# Patient Record
Sex: Male | Born: 1935 | Race: White | Hispanic: No | Smoking: Former smoker
Health system: Southern US, Community
[De-identification: ages and names within clinical notes are randomized; demographics above are authoritative.]

## PROBLEM LIST (undated history)

## (undated) DIAGNOSIS — I509 Heart failure, unspecified: Secondary | ICD-10-CM

## (undated) DIAGNOSIS — R1310 Dysphagia, unspecified: Secondary | ICD-10-CM

## (undated) DIAGNOSIS — I4892 Unspecified atrial flutter: Secondary | ICD-10-CM

## (undated) DIAGNOSIS — I739 Peripheral vascular disease, unspecified: Secondary | ICD-10-CM

## (undated) DIAGNOSIS — K5520 Angiodysplasia of colon without hemorrhage: Secondary | ICD-10-CM

## (undated) DIAGNOSIS — C73 Malignant neoplasm of thyroid gland: Secondary | ICD-10-CM

## (undated) DIAGNOSIS — E039 Hypothyroidism, unspecified: Secondary | ICD-10-CM

## (undated) DIAGNOSIS — K259 Gastric ulcer, unspecified as acute or chronic, without hemorrhage or perforation: Secondary | ICD-10-CM

## (undated) DIAGNOSIS — J449 Chronic obstructive pulmonary disease, unspecified: Secondary | ICD-10-CM

## (undated) DIAGNOSIS — I2510 Atherosclerotic heart disease of native coronary artery without angina pectoris: Secondary | ICD-10-CM

## (undated) DIAGNOSIS — D352 Benign neoplasm of pituitary gland: Secondary | ICD-10-CM

## (undated) DIAGNOSIS — I219 Acute myocardial infarction, unspecified: Secondary | ICD-10-CM

## (undated) DIAGNOSIS — K219 Gastro-esophageal reflux disease without esophagitis: Secondary | ICD-10-CM

## (undated) DIAGNOSIS — J3800 Paralysis of vocal cords and larynx, unspecified: Secondary | ICD-10-CM

## (undated) DIAGNOSIS — R51 Headache: Secondary | ICD-10-CM

## (undated) DIAGNOSIS — D509 Iron deficiency anemia, unspecified: Secondary | ICD-10-CM

## (undated) HISTORY — DX: Peripheral vascular disease, unspecified: I73.9

## (undated) HISTORY — DX: Atherosclerotic heart disease of native coronary artery without angina pectoris: I25.10

## (undated) HISTORY — PX: CARDIAC CATHETERIZATION: SHX172

## (undated) HISTORY — DX: Gastric ulcer, unspecified as acute or chronic, without hemorrhage or perforation: K25.9

## (undated) HISTORY — DX: Heart failure, unspecified: I50.9

## (undated) HISTORY — PX: TONSILLECTOMY: SUR1361

## (undated) HISTORY — DX: Benign neoplasm of pituitary gland: D35.2

## (undated) HISTORY — DX: Unspecified atrial flutter: I48.92

## (undated) HISTORY — DX: Iron deficiency anemia, unspecified: D50.9

---

## 2001-11-29 HISTORY — PX: CORONARY ARTERY BYPASS GRAFT: SHX141

## 2002-01-03 ENCOUNTER — Inpatient Hospital Stay (HOSPITAL_COMMUNITY): Admission: EM | Admit: 2002-01-03 | Discharge: 2002-01-11 | Payer: Self-pay | Admitting: Emergency Medicine

## 2002-01-03 ENCOUNTER — Encounter: Payer: Self-pay | Admitting: Emergency Medicine

## 2002-01-04 ENCOUNTER — Encounter: Payer: Self-pay | Admitting: Cardiothoracic Surgery

## 2002-01-05 ENCOUNTER — Encounter: Payer: Self-pay | Admitting: Cardiothoracic Surgery

## 2002-01-06 ENCOUNTER — Encounter: Payer: Self-pay | Admitting: Thoracic Surgery (Cardiothoracic Vascular Surgery)

## 2002-01-06 ENCOUNTER — Encounter: Payer: Self-pay | Admitting: Cardiothoracic Surgery

## 2002-01-07 ENCOUNTER — Encounter: Payer: Self-pay | Admitting: Thoracic Surgery (Cardiothoracic Vascular Surgery)

## 2002-01-08 ENCOUNTER — Encounter: Payer: Self-pay | Admitting: Thoracic Surgery (Cardiothoracic Vascular Surgery)

## 2002-01-09 ENCOUNTER — Encounter: Payer: Self-pay | Admitting: Cardiothoracic Surgery

## 2002-01-26 ENCOUNTER — Encounter: Payer: Self-pay | Admitting: Cardiovascular Disease

## 2002-01-26 ENCOUNTER — Ambulatory Visit (HOSPITAL_COMMUNITY): Admission: RE | Admit: 2002-01-26 | Discharge: 2002-01-26 | Payer: Self-pay | Admitting: Cardiovascular Disease

## 2002-02-26 ENCOUNTER — Encounter: Payer: Self-pay | Admitting: Cardiothoracic Surgery

## 2002-02-26 ENCOUNTER — Ambulatory Visit (HOSPITAL_COMMUNITY): Admission: RE | Admit: 2002-02-26 | Discharge: 2002-02-26 | Payer: Self-pay | Admitting: Cardiothoracic Surgery

## 2003-11-15 ENCOUNTER — Observation Stay (HOSPITAL_COMMUNITY): Admission: EM | Admit: 2003-11-15 | Discharge: 2003-11-18 | Payer: Self-pay | Admitting: Emergency Medicine

## 2010-11-29 HISTORY — PX: EYE SURGERY: SHX253

## 2011-09-23 ENCOUNTER — Other Ambulatory Visit: Payer: Self-pay | Admitting: Neurology

## 2011-09-23 DIAGNOSIS — H5310 Unspecified subjective visual disturbances: Secondary | ICD-10-CM

## 2011-09-28 ENCOUNTER — Ambulatory Visit
Admission: RE | Admit: 2011-09-28 | Discharge: 2011-09-28 | Disposition: A | Discharge status: Home / Self Care | Payer: Medicare Other | Source: Ambulatory Visit | Attending: Neurology | Admitting: Neurology

## 2011-09-28 DIAGNOSIS — H5310 Unspecified subjective visual disturbances: Secondary | ICD-10-CM

## 2011-09-28 MED ORDER — GADOBENATE DIMEGLUMINE 529 MG/ML IV SOLN
20.0000 mL | Freq: Once | INTRAVENOUS | Status: AC | PRN
  Administered 2011-09-28: 20 mL via INTRAVENOUS

## 2011-12-07 DIAGNOSIS — C313 Malignant neoplasm of sphenoid sinus: Secondary | ICD-10-CM | POA: Diagnosis not present

## 2011-12-08 DIAGNOSIS — Z01810 Encounter for preprocedural cardiovascular examination: Secondary | ICD-10-CM | POA: Diagnosis not present

## 2011-12-08 DIAGNOSIS — I2510 Atherosclerotic heart disease of native coronary artery without angina pectoris: Secondary | ICD-10-CM | POA: Diagnosis not present

## 2011-12-08 DIAGNOSIS — R0609 Other forms of dyspnea: Secondary | ICD-10-CM | POA: Diagnosis not present

## 2011-12-08 DIAGNOSIS — R0602 Shortness of breath: Secondary | ICD-10-CM | POA: Diagnosis not present

## 2011-12-08 HISTORY — PX: OTHER SURGICAL HISTORY: SHX169

## 2011-12-10 DIAGNOSIS — E782 Mixed hyperlipidemia: Secondary | ICD-10-CM | POA: Diagnosis not present

## 2011-12-10 DIAGNOSIS — I2510 Atherosclerotic heart disease of native coronary artery without angina pectoris: Secondary | ICD-10-CM | POA: Diagnosis not present

## 2011-12-10 DIAGNOSIS — J449 Chronic obstructive pulmonary disease, unspecified: Secondary | ICD-10-CM | POA: Diagnosis not present

## 2011-12-16 ENCOUNTER — Encounter (HOSPITAL_COMMUNITY): Payer: Self-pay | Admitting: Pharmacy Technician

## 2011-12-24 ENCOUNTER — Encounter (HOSPITAL_COMMUNITY): Payer: Self-pay

## 2011-12-24 ENCOUNTER — Other Ambulatory Visit: Payer: Self-pay

## 2011-12-24 ENCOUNTER — Encounter (HOSPITAL_COMMUNITY)
Admission: RE | Admit: 2011-12-24 | Discharge: 2011-12-24 | Disposition: A | Discharge status: Home / Self Care | Payer: Medicare Other | Source: Ambulatory Visit | Attending: Neurosurgery | Admitting: Neurosurgery

## 2011-12-24 ENCOUNTER — Encounter (HOSPITAL_COMMUNITY)
Admission: RE | Admit: 2011-12-24 | Discharge: 2011-12-24 | Disposition: A | Discharge status: Home / Self Care | Payer: Medicare Other | Source: Ambulatory Visit | Attending: Anesthesiology | Admitting: Anesthesiology

## 2011-12-24 DIAGNOSIS — Z01811 Encounter for preprocedural respiratory examination: Secondary | ICD-10-CM | POA: Diagnosis not present

## 2011-12-24 DIAGNOSIS — D352 Benign neoplasm of pituitary gland: Secondary | ICD-10-CM | POA: Diagnosis not present

## 2011-12-24 DIAGNOSIS — J438 Other emphysema: Secondary | ICD-10-CM | POA: Diagnosis not present

## 2011-12-24 DIAGNOSIS — Z79899 Other long term (current) drug therapy: Secondary | ICD-10-CM | POA: Diagnosis not present

## 2011-12-24 DIAGNOSIS — R918 Other nonspecific abnormal finding of lung field: Secondary | ICD-10-CM | POA: Diagnosis not present

## 2011-12-24 DIAGNOSIS — J449 Chronic obstructive pulmonary disease, unspecified: Secondary | ICD-10-CM | POA: Diagnosis not present

## 2011-12-24 DIAGNOSIS — Z7982 Long term (current) use of aspirin: Secondary | ICD-10-CM | POA: Diagnosis not present

## 2011-12-24 HISTORY — DX: Headache: R51

## 2011-12-24 HISTORY — DX: Chronic obstructive pulmonary disease, unspecified: J44.9

## 2011-12-24 HISTORY — DX: Acute myocardial infarction, unspecified: I21.9

## 2011-12-24 HISTORY — DX: Gastro-esophageal reflux disease without esophagitis: K21.9

## 2011-12-24 LAB — DIFFERENTIAL
Basophils Absolute: 0.1 10*3/uL (ref 0.0–0.1)
Basophils Relative: 1 % (ref 0–1)
Eosinophils Absolute: 0.9 10*3/uL — ABNORMAL HIGH (ref 0.0–0.7)
Eosinophils Relative: 7 % — ABNORMAL HIGH (ref 0–5)
Lymphocytes Relative: 21 % (ref 12–46)
Lymphs Abs: 2.8 10*3/uL (ref 0.7–4.0)
Monocytes Absolute: 1 10*3/uL (ref 0.1–1.0)
Monocytes Relative: 8 % (ref 3–12)
Neutro Abs: 8.3 10*3/uL — ABNORMAL HIGH (ref 1.7–7.7)
Neutrophils Relative %: 63 % (ref 43–77)

## 2011-12-24 LAB — TYPE AND SCREEN
ABO/RH(D): O POS
Antibody Screen: NEGATIVE

## 2011-12-24 LAB — COMPREHENSIVE METABOLIC PANEL
ALT: 28 U/L (ref 0–53)
AST: 29 U/L (ref 0–37)
Albumin: 4 g/dL (ref 3.5–5.2)
Alkaline Phosphatase: 79 U/L (ref 39–117)
BUN: 13 mg/dL (ref 6–23)
CO2: 26 mEq/L (ref 19–32)
Calcium: 9.8 mg/dL (ref 8.4–10.5)
Chloride: 103 mEq/L (ref 96–112)
Creatinine, Ser: 0.84 mg/dL (ref 0.50–1.35)
GFR calc Af Amer: >90 mL/min (ref >90)
GFR calc non Af Amer: 84 mL/min — ABNORMAL LOW (ref >90)
Glucose, Bld: 90 mg/dL (ref 70–99)
Potassium: 4.1 mEq/L (ref 3.5–5.1)
Sodium: 140 mEq/L (ref 135–145)
Total Bilirubin: 0.3 mg/dL (ref 0.3–1.2)
Total Protein: 7.6 g/dL (ref 6.0–8.3)

## 2011-12-24 LAB — CBC
HCT: 41.9 % (ref 39.0–52.0)
Hemoglobin: 13.2 g/dL (ref 13.0–17.0)
MCH: 27.6 pg (ref 26.0–34.0)
MCHC: 31.5 g/dL (ref 30.0–36.0)
MCV: 87.7 fL (ref 78.0–100.0)
Platelets: 310 10*3/uL (ref 150–400)
RBC: 4.78 MIL/uL (ref 4.22–5.81)
RDW: 14.8 % (ref 11.5–15.5)
WBC: 13.1 10*3/uL — ABNORMAL HIGH (ref 4.0–10.5)

## 2011-12-24 LAB — SURGICAL PCR SCREEN
MRSA, PCR: NEGATIVE
Staphylococcus aureus: NEGATIVE

## 2011-12-24 LAB — ABO/RH: ABO/RH(D): O POS

## 2011-12-24 NOTE — Pre-Procedure Instructions (Signed)
20 John Sampson  12/24/2011   Your procedure is scheduled on:  February 2, FRIDAY  Report to [facility] at  5:30* AM.  Call this number if you have problems the morning of surgery: [phone]   Remember:   Do not eat food:After Midnight THURSDAY.  May have clear liquids: up to 4 Hours before arrival TIME--1:30 AM.  Clear liquids include soda, tea, black coffee, apple or grape juice, broth.   Take these medicines the morning of surgery with A SIP OF WATER: ALBUTEROL             DILACOR XR, TOPROL, OMEPRAZOLE   Do not wear jewelry, make-up or nail polish.  Do not wear lotions, powders, or perfumes. You may wear deodorant.  Do not shave 48 hours prior to surgery.   Do not bring valuables to the hospital.   Contacts, dentures or bridgework may not be worn into surgery.  Leave suitcase in the car. After surgery it may be brought to your room.  For patients admitted to the hospital, checkout time is 11:00 AM the day of discharge.   Patients discharged the day of surgery will not be allowed to drive home.  Name and phone number of your driver:  DENISE CHAMBERS -- DTR  Special Instructions: CHG Shower Use Special Wash: 1/2 bottle night before surgery and 1/2 bottle morning of surgery.   Please read over the following fact sheets that you were given: Pain Booklet, Blood Transfusion Information, MRSA Information and Surgical Site Infection Prevention

## 2011-12-27 NOTE — Consult Note (Signed)
Anesthesia:  Patient is a 76 year old male scheduled for a transphenoidal hypophysectomy for a pituitary macroadenoma on 12/31/11.  He has been cleared by Dr. Alanda Amass.  He is felt low CV risk, but moderate pulmonary risk according to his clearance note.    His history includes CAD/MI s/p CABG X 5 2003, COPD, HTN, GERD, headaches, former smoker.  EKG from 12/24/11 showed NSR with sinus arrythmia, LVH with repolarization abnormality (primarily V2, and lateral leads).  It is not significantly changed since at least October of 2011.  He had a stress test on 12/08/11 that showed a moderate perfusion defect due to infarct/scar with mild peri-infarct ischemia in the basal inferolateral and mid inferolateral regions.  Post-stress EF 43% with global LV systolic function mildly reduced.  Similar defects were seen on scans from 2007 and 2009, and ultimately the study was felt low risk.    [facility] sent an echo from 08/12/09, but notes reflect that he had a more recent echo in February of 2012 (will request), showing "normal left ventricular systolic function with an ejection fraction of 55% and no evidence of wall motion abnormalities."  This was different from "all his previous evaluations" that showed mildly depressed LV systolic function with an EF around 45-50%.    CXR on 12/24/11 showed emphysematous and minimal bronchitic changes. Enlargement of cardiac silhouette.    Labs noted.    Anticipate he can proceed if not acute cardiopulmonary changes.

## 2011-12-30 MED ORDER — CEFAZOLIN SODIUM 1-5 GM-% IV SOLN: 1.0000 g | INTRAVENOUS | Status: DC

## 2011-12-31 ENCOUNTER — Ambulatory Visit (HOSPITAL_COMMUNITY): Payer: Medicare Other

## 2011-12-31 ENCOUNTER — Encounter (HOSPITAL_COMMUNITY): Payer: Self-pay | Admitting: *Deleted

## 2011-12-31 ENCOUNTER — Ambulatory Visit (HOSPITAL_COMMUNITY): Payer: Medicare Other | Admitting: Vascular Surgery

## 2011-12-31 ENCOUNTER — Encounter (HOSPITAL_COMMUNITY): Payer: Self-pay | Admitting: Vascular Surgery

## 2011-12-31 ENCOUNTER — Encounter (HOSPITAL_COMMUNITY)
Admission: RE | Disposition: A | Discharge status: Home / Self Care | Payer: Self-pay | Source: Ambulatory Visit | Attending: Neurosurgery

## 2011-12-31 ENCOUNTER — Inpatient Hospital Stay (HOSPITAL_COMMUNITY)
Admission: RE | Admit: 2011-12-31 | Discharge: 2012-01-04 | DRG: 615 | Disposition: A | Discharge status: Home / Self Care | Payer: Medicare Other | Source: Ambulatory Visit | Attending: Neurosurgery | Admitting: Neurosurgery

## 2011-12-31 ENCOUNTER — Other Ambulatory Visit: Payer: Self-pay | Admitting: Neurosurgery

## 2011-12-31 DIAGNOSIS — I2510 Atherosclerotic heart disease of native coronary artery without angina pectoris: Secondary | ICD-10-CM | POA: Diagnosis present

## 2011-12-31 DIAGNOSIS — Z09 Encounter for follow-up examination after completed treatment for conditions other than malignant neoplasm: Secondary | ICD-10-CM | POA: Diagnosis not present

## 2011-12-31 DIAGNOSIS — Z951 Presence of aortocoronary bypass graft: Secondary | ICD-10-CM | POA: Diagnosis not present

## 2011-12-31 DIAGNOSIS — Z79899 Other long term (current) drug therapy: Secondary | ICD-10-CM

## 2011-12-31 DIAGNOSIS — D352 Benign neoplasm of pituitary gland: Principal | ICD-10-CM | POA: Diagnosis present

## 2011-12-31 DIAGNOSIS — I10 Essential (primary) hypertension: Secondary | ICD-10-CM | POA: Diagnosis present

## 2011-12-31 DIAGNOSIS — K219 Gastro-esophageal reflux disease without esophagitis: Secondary | ICD-10-CM | POA: Diagnosis present

## 2011-12-31 DIAGNOSIS — I252 Old myocardial infarction: Secondary | ICD-10-CM

## 2011-12-31 DIAGNOSIS — Z01810 Encounter for preprocedural cardiovascular examination: Secondary | ICD-10-CM | POA: Diagnosis not present

## 2011-12-31 DIAGNOSIS — J4489 Other specified chronic obstructive pulmonary disease: Secondary | ICD-10-CM | POA: Diagnosis not present

## 2011-12-31 DIAGNOSIS — J449 Chronic obstructive pulmonary disease, unspecified: Secondary | ICD-10-CM | POA: Diagnosis present

## 2011-12-31 DIAGNOSIS — R51 Headache: Secondary | ICD-10-CM | POA: Diagnosis not present

## 2011-12-31 DIAGNOSIS — Z87891 Personal history of nicotine dependence: Secondary | ICD-10-CM | POA: Diagnosis not present

## 2011-12-31 DIAGNOSIS — D353 Benign neoplasm of craniopharyngeal duct: Principal | ICD-10-CM | POA: Diagnosis present

## 2011-12-31 DIAGNOSIS — Z01818 Encounter for other preprocedural examination: Secondary | ICD-10-CM | POA: Diagnosis not present

## 2011-12-31 DIAGNOSIS — D443 Neoplasm of uncertain behavior of pituitary gland: Secondary | ICD-10-CM | POA: Diagnosis not present

## 2011-12-31 DIAGNOSIS — Z7982 Long term (current) use of aspirin: Secondary | ICD-10-CM

## 2011-12-31 DIAGNOSIS — Z01812 Encounter for preprocedural laboratory examination: Secondary | ICD-10-CM

## 2011-12-31 DIAGNOSIS — R930 Abnormal findings on diagnostic imaging of skull and head, not elsewhere classified: Secondary | ICD-10-CM | POA: Diagnosis not present

## 2011-12-31 DIAGNOSIS — D444 Neoplasm of uncertain behavior of craniopharyngeal duct: Secondary | ICD-10-CM | POA: Diagnosis not present

## 2011-12-31 DIAGNOSIS — I517 Cardiomegaly: Secondary | ICD-10-CM | POA: Diagnosis not present

## 2011-12-31 DIAGNOSIS — J811 Chronic pulmonary edema: Secondary | ICD-10-CM | POA: Diagnosis not present

## 2011-12-31 HISTORY — PX: CRANIOTOMY: SHX93

## 2011-12-31 LAB — BASIC METABOLIC PANEL WITH GFR
BUN: 12 mg/dL (ref 6–23)
CO2: 22 meq/L (ref 19–32)
Calcium: 8.5 mg/dL (ref 8.4–10.5)
Chloride: 107 meq/L (ref 96–112)
Creatinine, Ser: 0.71 mg/dL (ref 0.50–1.35)
GFR calc Af Amer: >90 mL/min
GFR calc non Af Amer: >90 mL/min
Glucose, Bld: 162 mg/dL — ABNORMAL HIGH (ref 70–99)
Potassium: 4.2 meq/L (ref 3.5–5.1)
Sodium: 137 meq/L (ref 135–145)

## 2011-12-31 LAB — CBC
HCT: 37.3 % — ABNORMAL LOW (ref 39.0–52.0)
MCV: 86.9 fL (ref 78.0–100.0)
RDW: 15.1 % (ref 11.5–15.5)
WBC: 12.3 10*3/uL — ABNORMAL HIGH (ref 4.0–10.5)

## 2011-12-31 SURGERY — CRANIOTOMY HYPOPHYSECTOMY TRANSNASAL APPROACH
Anesthesia: General | Site: Nose | Wound class: Clean Contaminated

## 2011-12-31 MED ORDER — IPRATROPIUM-ALBUTEROL 18-103 MCG/ACT IN AERO
2.0000 | INHALATION_SPRAY | Freq: Four times a day (QID) | RESPIRATORY_TRACT | Status: DC | PRN
  Filled 2011-12-31 (×2): qty 14.7

## 2011-12-31 MED ORDER — METOPROLOL TARTRATE 1 MG/ML IV SOLN
INTRAVENOUS | Status: DC | PRN
  Administered 2011-12-31: 2 mg via INTRAVENOUS

## 2011-12-31 MED ORDER — DILTIAZEM HCL ER 180 MG PO CP24
180.0000 mg | ORAL_CAPSULE | Freq: Every day | ORAL | Status: DC
  Administered 2011-12-31 – 2012-01-04 (×5): 180 mg via ORAL
  Filled 2011-12-31 (×5): qty 1

## 2011-12-31 MED ORDER — EPHEDRINE SULFATE 50 MG/ML IJ SOLN
INTRAMUSCULAR | Status: DC | PRN
  Administered 2011-12-31: 5 mg via INTRAVENOUS

## 2011-12-31 MED ORDER — HYDROCODONE-ACETAMINOPHEN 5-325 MG PO TABS
1.0000 | ORAL_TABLET | ORAL | Status: DC | PRN
  Administered 2012-01-01 – 2012-01-03 (×3): 1 via ORAL
  Filled 2011-12-31 (×3): qty 1

## 2011-12-31 MED ORDER — CEFAZOLIN SODIUM 1-5 GM-% IV SOLN
1.0000 g | Freq: Three times a day (TID) | INTRAVENOUS | Status: AC
  Administered 2011-12-31 (×2): 1 g via INTRAVENOUS
  Filled 2011-12-31 (×2): qty 50

## 2011-12-31 MED ORDER — MIDAZOLAM HCL 5 MG/5ML IJ SOLN
INTRAMUSCULAR | Status: DC | PRN
  Administered 2011-12-31: 1 mg via INTRAVENOUS

## 2011-12-31 MED ORDER — HYDRALAZINE HCL 20 MG/ML IJ SOLN
5.0000 mg | INTRAMUSCULAR | Status: DC | PRN
  Administered 2011-12-31: 5 mg via INTRAVENOUS
  Filled 2011-12-31: qty 0.25

## 2011-12-31 MED ORDER — POTASSIUM CHLORIDE IN NACL 20-0.45 MEQ/L-% IV SOLN
INTRAVENOUS | Status: DC
  Administered 2011-12-31 – 2012-01-01 (×2): via INTRAVENOUS
  Filled 2011-12-31 (×3): qty 1000

## 2011-12-31 MED ORDER — ALBUTEROL SULFATE (2.5 MG/3ML) 0.083% IN NEBU
INHALATION_SOLUTION | RESPIRATORY_TRACT | Status: DC | PRN
  Administered 2011-12-31: 2.5 mg via RESPIRATORY_TRACT

## 2011-12-31 MED ORDER — DOCUSATE SODIUM 100 MG PO CAPS
100.0000 mg | ORAL_CAPSULE | Freq: Two times a day (BID) | ORAL | Status: DC
  Administered 2011-12-31 – 2012-01-04 (×9): 100 mg via ORAL
  Filled 2011-12-31 (×10): qty 1

## 2011-12-31 MED ORDER — PANTOPRAZOLE SODIUM 40 MG IV SOLR
40.0000 mg | INTRAVENOUS | Status: DC
  Administered 2011-12-31: 40 mg via INTRAVENOUS
  Filled 2011-12-31 (×2): qty 40

## 2011-12-31 MED ORDER — LIDOCAINE HCL (CARDIAC) 20 MG/ML IV SOLN
INTRAVENOUS | Status: DC | PRN
  Administered 2011-12-31: 40 mg via INTRAVENOUS

## 2011-12-31 MED ORDER — HYDROCHLOROTHIAZIDE 25 MG PO TABS
25.0000 mg | ORAL_TABLET | Freq: Every day | ORAL | Status: DC
  Administered 2011-12-31 – 2012-01-04 (×5): 25 mg via ORAL
  Filled 2011-12-31 (×5): qty 1

## 2011-12-31 MED ORDER — THROMBIN 5000 UNITS EX SOLR
OROMUCOSAL | Status: DC | PRN
  Administered 2011-12-31: 10:00:00 via TOPICAL

## 2011-12-31 MED ORDER — HYDROCORTISONE SOD SUCCINATE 100 MG PF FOR IT USE
INTRAMUSCULAR | Status: DC | PRN
  Administered 2011-12-31: 100 mg via INTRATHECAL

## 2011-12-31 MED ORDER — SODIUM CHLORIDE 0.9 % IR SOLN
Status: DC | PRN
  Administered 2011-12-31: 09:00:00

## 2011-12-31 MED ORDER — OXYMETAZOLINE HCL 0.05 % NA SOLN
NASAL | Status: DC | PRN
  Administered 2011-12-31: 1 via NASAL

## 2011-12-31 MED ORDER — LIDOCAINE-EPINEPHRINE 1 %-1:100000 IJ SOLN
INTRAMUSCULAR | Status: DC | PRN
  Administered 2011-12-31 (×2): 20 mL

## 2011-12-31 MED ORDER — ASPIRIN EC 81 MG PO TBEC
162.0000 mg | DELAYED_RELEASE_TABLET | Freq: Every day | ORAL | Status: DC
  Administered 2011-12-31 – 2012-01-04 (×5): 162 mg via ORAL
  Filled 2011-12-31 (×5): qty 2

## 2011-12-31 MED ORDER — HYDROCORTISONE SOD SUCCINATE 100 MG IJ SOLR
50.0000 mg | Freq: Three times a day (TID) | INTRAMUSCULAR | Status: AC
  Administered 2011-12-31 – 2012-01-01 (×4): 50 mg via INTRAVENOUS
  Filled 2011-12-31 (×4): qty 1

## 2011-12-31 MED ORDER — BACITRACIN ZINC 500 UNIT/GM EX OINT
TOPICAL_OINTMENT | CUTANEOUS | Status: DC | PRN
  Administered 2011-12-31: 1 via TOPICAL

## 2011-12-31 MED ORDER — CEFAZOLIN SODIUM-DEXTROSE 2-3 GM-% IV SOLR
2.0000 g | INTRAVENOUS | Status: AC
  Administered 2011-12-31: 2 g via INTRAVENOUS
  Filled 2011-12-31: qty 50

## 2011-12-31 MED ORDER — HYDRALAZINE HCL 20 MG/ML IJ SOLN
5.0000 mg | INTRAMUSCULAR | Status: DC | PRN
  Administered 2011-12-31: 5 mg via INTRAVENOUS
  Filled 2011-12-31: qty 1

## 2011-12-31 MED ORDER — METOPROLOL SUCCINATE ER 25 MG PO TB24
25.0000 mg | ORAL_TABLET | Freq: Every day | ORAL | Status: DC
  Administered 2011-12-31 – 2012-01-04 (×3): 25 mg via ORAL
  Filled 2011-12-31 (×5): qty 1

## 2011-12-31 MED ORDER — PANTOPRAZOLE SODIUM 40 MG PO TBEC: 40.0000 mg | DELAYED_RELEASE_TABLET | Freq: Every day | ORAL | Status: DC

## 2011-12-31 MED ORDER — PROPOFOL 10 MG/ML IV EMUL
INTRAVENOUS | Status: DC | PRN
  Administered 2011-12-31: 50 mg via INTRAVENOUS
  Administered 2011-12-31: 20 mg via INTRAVENOUS
  Administered 2011-12-31: 100 mg via INTRAVENOUS
  Administered 2011-12-31: 50 mg via INTRAVENOUS

## 2011-12-31 MED ORDER — BACITRACIN 50000 UNITS IM SOLR
INTRAMUSCULAR | Status: AC
  Filled 2011-12-31: qty 1

## 2011-12-31 MED ORDER — NEOSTIGMINE METHYLSULFATE 1 MG/ML IJ SOLN
INTRAMUSCULAR | Status: DC | PRN
  Administered 2011-12-31: 4 mg via INTRAVENOUS

## 2011-12-31 MED ORDER — DROPERIDOL 2.5 MG/ML IJ SOLN: 0.6250 mg | INTRAMUSCULAR | Status: DC | PRN

## 2011-12-31 MED ORDER — ACETAMINOPHEN 325 MG PO TABS
650.0000 mg | ORAL_TABLET | ORAL | Status: DC | PRN
  Administered 2012-01-01 – 2012-01-04 (×5): 650 mg via ORAL
  Filled 2011-12-31 (×5): qty 2

## 2011-12-31 MED ORDER — SODIUM CHLORIDE 0.9 % IV SOLN
10000.0000 ug | INTRAVENOUS | Status: DC | PRN
  Administered 2011-12-31: 25 ug/min via INTRAVENOUS

## 2011-12-31 MED ORDER — LABETALOL HCL 5 MG/ML IV SOLN
10.0000 mg | INTRAVENOUS | Status: DC | PRN
  Administered 2011-12-31: 10 mg via INTRAVENOUS
  Filled 2011-12-31: qty 4

## 2011-12-31 MED ORDER — LISINOPRIL 20 MG PO TABS
20.0000 mg | ORAL_TABLET | Freq: Every day | ORAL | Status: DC
  Administered 2011-12-31 – 2012-01-04 (×4): 20 mg via ORAL
  Filled 2011-12-31 (×5): qty 1

## 2011-12-31 MED ORDER — SODIUM CHLORIDE 0.9 % IV SOLN
INTRAVENOUS | Status: AC
  Administered 2011-12-31: 07:00:00 via INTRAVENOUS
  Filled 2011-12-31: qty 500

## 2011-12-31 MED ORDER — LEVETIRACETAM 500 MG/5ML IV SOLN
500.0000 mg | Freq: Two times a day (BID) | INTRAVENOUS | Status: DC
  Administered 2011-12-31 – 2012-01-02 (×5): 500 mg via INTRAVENOUS
  Filled 2011-12-31 (×6): qty 5

## 2011-12-31 MED ORDER — ONDANSETRON HCL 4 MG PO TABS: 4.0000 mg | ORAL_TABLET | ORAL | Status: DC | PRN

## 2011-12-31 MED ORDER — ONDANSETRON HCL 4 MG/2ML IJ SOLN
4.0000 mg | INTRAMUSCULAR | Status: DC | PRN
  Administered 2011-12-31: 4 mg via INTRAVENOUS
  Filled 2011-12-31: qty 2

## 2011-12-31 MED ORDER — SODIUM CHLORIDE 0.9 % IV SOLN
INTRAVENOUS | Status: DC | PRN
  Administered 2011-12-31 (×2): via INTRAVENOUS

## 2011-12-31 MED ORDER — ACETAMINOPHEN 650 MG RE SUPP: 650.0000 mg | RECTAL | Status: DC | PRN

## 2011-12-31 MED ORDER — PANTOPRAZOLE SODIUM 40 MG IV SOLR: 40.0000 mg | Freq: Every day | INTRAVENOUS | Status: DC

## 2011-12-31 MED ORDER — GLYCOPYRROLATE 0.2 MG/ML IJ SOLN
INTRAMUSCULAR | Status: DC | PRN
  Administered 2011-12-31: .7 mg via INTRAVENOUS

## 2011-12-31 MED ORDER — CEFAZOLIN SODIUM-DEXTROSE 2-3 GM-% IV SOLR
INTRAVENOUS | Status: AC
  Filled 2011-12-31: qty 50

## 2011-12-31 MED ORDER — ONDANSETRON HCL 4 MG/2ML IJ SOLN
INTRAMUSCULAR | Status: DC | PRN
  Administered 2011-12-31: 4 mg via INTRAVENOUS

## 2011-12-31 MED ORDER — CEFAZOLIN SODIUM 1-5 GM-% IV SOLN: 1.0000 g | INTRAVENOUS | Status: DC

## 2011-12-31 MED ORDER — HYDROMORPHONE HCL PF 1 MG/ML IJ SOLN: 0.2500 mg | INTRAMUSCULAR | Status: DC | PRN

## 2011-12-31 MED ORDER — VECURONIUM BROMIDE 10 MG IV SOLR
INTRAVENOUS | Status: DC | PRN
  Administered 2011-12-31: 4 mg via INTRAVENOUS
  Administered 2011-12-31: 10 mg via INTRAVENOUS

## 2011-12-31 MED ORDER — MORPHINE SULFATE 2 MG/ML IJ SOLN
1.0000 mg | INTRAMUSCULAR | Status: DC | PRN
  Administered 2011-12-31: 2 mg via INTRAVENOUS
  Filled 2011-12-31: qty 1

## 2011-12-31 MED ORDER — 0.9 % SODIUM CHLORIDE (POUR BTL) OPTIME
TOPICAL | Status: DC | PRN
  Administered 2011-12-31: 1000 mL

## 2011-12-31 MED ORDER — FENTANYL CITRATE 0.05 MG/ML IJ SOLN
INTRAMUSCULAR | Status: DC | PRN
  Administered 2011-12-31: 50 ug via INTRAVENOUS
  Administered 2011-12-31 (×2): 25 ug via INTRAVENOUS
  Administered 2011-12-31: 50 ug via INTRAVENOUS
  Administered 2011-12-31: 25 ug via INTRAVENOUS
  Administered 2011-12-31: 50 ug via INTRAVENOUS
  Administered 2011-12-31: 125 ug via INTRAVENOUS

## 2011-12-31 MED ORDER — THROMBIN 5000 UNITS EX SOLR
CUTANEOUS | Status: DC | PRN
  Administered 2011-12-31 (×2): 5000 [IU] via TOPICAL

## 2011-12-31 MED ORDER — SIMVASTATIN 10 MG PO TABS
10.0000 mg | ORAL_TABLET | Freq: Every day | ORAL | Status: DC
  Administered 2011-12-31 – 2012-01-03 (×4): 10 mg via ORAL
  Filled 2011-12-31 (×5): qty 1

## 2011-12-31 SURGICAL SUPPLY — 115 items
ATTRACTOMAT 16X20 MAGNETIC DRP (DRAPES) IMPLANT
BAG DECANTER FOR FLEXI CONT (MISCELLANEOUS) ×2 IMPLANT
BENZOIN TINCTURE PRP APPL 2/3 (GAUZE/BANDAGES/DRESSINGS) IMPLANT
BLADE SURG 10 STRL SS (BLADE) ×2 IMPLANT
BLADE SURG 11 STRL SS (BLADE) ×4 IMPLANT
BLADE SURG 15 STRL LF DISP TIS (BLADE) ×2 IMPLANT
BLADE SURG 15 STRL SS (BLADE) ×2
BRUSH SCRUB EZ PLAIN DRY (MISCELLANEOUS) ×2 IMPLANT
CANISTER SUCTION 2500CC (MISCELLANEOUS) ×4 IMPLANT
CLEANER TIP ELECTROSURG 2X2 (MISCELLANEOUS) IMPLANT
CLOTH BEACON ORANGE TIMEOUT ST (SAFETY) ×4 IMPLANT
COAGULATOR SUCT 8FR VV (MISCELLANEOUS) IMPLANT
CONT SPEC 4OZ CLIKSEAL STRL BL (MISCELLANEOUS) ×12 IMPLANT
CORDS BIPOLAR (ELECTRODE) ×2 IMPLANT
COVER MAYO STAND STRL (DRAPES) ×2 IMPLANT
COVER TABLE BACK 60X90 (DRAPES) IMPLANT
DECANTER SPIKE VIAL GLASS SM (MISCELLANEOUS) ×4 IMPLANT
DERMABOND ADVANCED (GAUZE/BANDAGES/DRESSINGS) ×1
DERMABOND ADVANCED .7 DNX12 (GAUZE/BANDAGES/DRESSINGS) ×1 IMPLANT
DRAPE C-ARM 42X72 X-RAY (DRAPES) ×2 IMPLANT
DRAPE EENT ADH APERT 15X15 STR (DRAPES) IMPLANT
DRAPE INCISE IOBAN 66X45 STRL (DRAPES) ×2 IMPLANT
DRAPE MICROSCOPE ZEISS OPMI (DRAPES) ×2 IMPLANT
DRAPE POUCH INSTRU U-SHP 10X18 (DRAPES) ×2 IMPLANT
DRAPE PROXIMA HALF (DRAPES) ×4 IMPLANT
DRAPE SURG 17X23 STRL (DRAPES) ×6 IMPLANT
DRESSING NASAL KENNEDY 3.5X.9 (MISCELLANEOUS) ×1 IMPLANT
DRESSING TELFA 8X3 (GAUZE/BANDAGES/DRESSINGS) ×2 IMPLANT
DRSG NASAL KENNEDY 3.5X.9 (MISCELLANEOUS) ×2
DRSG OPSITE 4X5.5 SM (GAUZE/BANDAGES/DRESSINGS) ×2 IMPLANT
ELECT COATED BLADE 2.86 ST (ELECTRODE) IMPLANT
ELECT NEEDLE TIP 2.8 STRL (NEEDLE) ×2 IMPLANT
ELECT REM PT RETURN 9FT ADLT (ELECTROSURGICAL) ×2
ELECTRODE REM PT RTRN 9FT ADLT (ELECTROSURGICAL) ×1 IMPLANT
GAUZE PACKING FOLDED 2  STR (GAUZE/BANDAGES/DRESSINGS) ×1
GAUZE PACKING FOLDED 2 STR (GAUZE/BANDAGES/DRESSINGS) ×1 IMPLANT
GAUZE SPONGE 2X2 8PLY STRL LF (GAUZE/BANDAGES/DRESSINGS) ×1 IMPLANT
GLOVE BIO SURGEON STRL SZ 6.5 (GLOVE) IMPLANT
GLOVE BIO SURGEON STRL SZ7 (GLOVE) IMPLANT
GLOVE BIO SURGEON STRL SZ7.5 (GLOVE) ×4 IMPLANT
GLOVE BIO SURGEON STRL SZ8 (GLOVE) ×4 IMPLANT
GLOVE BIO SURGEON STRL SZ8.5 (GLOVE) IMPLANT
GLOVE BIOGEL M 8.0 STRL (GLOVE) IMPLANT
GLOVE BIOGEL PI IND STRL 7.5 (GLOVE) ×2 IMPLANT
GLOVE BIOGEL PI IND STRL 8 (GLOVE) ×1 IMPLANT
GLOVE BIOGEL PI INDICATOR 7.5 (GLOVE) ×2
GLOVE BIOGEL PI INDICATOR 8 (GLOVE) ×1
GLOVE EXAM NITRILE LRG STRL (GLOVE) IMPLANT
GLOVE EXAM NITRILE MD LF STRL (GLOVE) ×6 IMPLANT
GLOVE EXAM NITRILE XL STR (GLOVE) IMPLANT
GLOVE EXAM NITRILE XS STR PU (GLOVE) IMPLANT
GLOVE INDICATOR 6.5 STRL GRN (GLOVE) IMPLANT
GLOVE INDICATOR 7.0 STRL GRN (GLOVE) IMPLANT
GLOVE INDICATOR 7.5 STRL GRN (GLOVE) IMPLANT
GLOVE INDICATOR 8.0 STRL GRN (GLOVE) IMPLANT
GLOVE SS BIOGEL STRL SZ 7.5 (GLOVE) ×1 IMPLANT
GLOVE SUPERSENSE BIOGEL SZ 7.5 (GLOVE) ×1
GLOVE SURG SS PI 6.5 STRL IVOR (GLOVE) IMPLANT
GLOVE SURG SS PI 7.5 STRL IVOR (GLOVE) ×4 IMPLANT
GLOVE SURG SS PI 8.0 STRL IVOR (GLOVE) ×6 IMPLANT
GOWN BRE IMP SLV AUR LG STRL (GOWN DISPOSABLE) ×2 IMPLANT
GOWN BRE IMP SLV AUR XL STRL (GOWN DISPOSABLE) ×8 IMPLANT
GOWN PREVENTION PLUS XLARGE (GOWN DISPOSABLE) ×4 IMPLANT
GOWN STRL NON-REIN LRG LVL3 (GOWN DISPOSABLE) ×2 IMPLANT
GOWN STRL REIN 2XL LVL4 (GOWN DISPOSABLE) ×2 IMPLANT
HEMOSTAT POWDER KIT SURGIFOAM (HEMOSTASIS) ×2 IMPLANT
HEMOSTAT SURGICEL 2X14 (HEMOSTASIS) IMPLANT
KIT BASIN OR (CUSTOM PROCEDURE TRAY) ×4 IMPLANT
KIT DRAIN CSF ACCUDRAIN (MISCELLANEOUS) IMPLANT
KIT ROOM TURNOVER OR (KITS) ×4 IMPLANT
MEROCEL POPE EPISTAXIS PACKING, DRAWSTRING, 10CM ×4 IMPLANT
NEEDLE 27GAX1X1/2 (NEEDLE) ×2 IMPLANT
NEEDLE HYPO 25X1 1.5 SAFETY (NEEDLE) ×2 IMPLANT
NEEDLE SPNL 22GX3.5 QUINCKE BK (NEEDLE) IMPLANT
NS IRRIG 1000ML POUR BTL (IV SOLUTION) ×4 IMPLANT
PAD ARMBOARD 7.5X6 YLW CONV (MISCELLANEOUS) ×6 IMPLANT
PATTIES SURGICAL .25X.25 (GAUZE/BANDAGES/DRESSINGS) IMPLANT
PATTIES SURGICAL .5 X.5 (GAUZE/BANDAGES/DRESSINGS) IMPLANT
PATTIES SURGICAL .5 X3 (DISPOSABLE) ×2 IMPLANT
PENCIL BUTTON HOLSTER BLD 10FT (ELECTRODE) ×2 IMPLANT
PENCIL FOOT CONTROL (ELECTRODE) IMPLANT
PIN MAYFIELD SKULL DISP (PIN) ×2 IMPLANT
RUBBERBAND STERILE (MISCELLANEOUS) ×4 IMPLANT
SHEET SIL 040 (INSTRUMENTS) IMPLANT
SPLINT NASAL DOYLE BI-VL (GAUZE/BANDAGES/DRESSINGS) ×4 IMPLANT
SPONGE GAUZE 2X2 STER 10/PKG (GAUZE/BANDAGES/DRESSINGS) ×1
SPONGE GAUZE 4X4 12PLY (GAUZE/BANDAGES/DRESSINGS) ×2 IMPLANT
SPONGE LAP 4X18 X RAY DECT (DISPOSABLE) ×2 IMPLANT
SPONGE SURGIFOAM ABS GEL 12-7 (HEMOSTASIS) IMPLANT
STAPLER SKIN PROX WIDE 3.9 (STAPLE) ×2 IMPLANT
STRIP CLOSURE SKIN 1/2X4 (GAUZE/BANDAGES/DRESSINGS) ×2 IMPLANT
SUT BONE WAX W31G (SUTURE) ×2 IMPLANT
SUT CHROMIC 3 0 PS 2 (SUTURE) IMPLANT
SUT CHROMIC 4 0 P 3 18 (SUTURE) ×2 IMPLANT
SUT CHROMIC 4 0 PS 2 18 (SUTURE) ×4 IMPLANT
SUT CHROMIC 5 0 P 3 (SUTURE) IMPLANT
SUT ETHILON 3 0 FSL (SUTURE) ×2 IMPLANT
SUT ETHILON 3 0 PS 1 (SUTURE) ×2 IMPLANT
SUT ETHILON 4 0 PS 2 18 (SUTURE) ×2 IMPLANT
SUT ETHILON 5 0 P 3 18 (SUTURE)
SUT ETHILON 6 0 P 1 (SUTURE) IMPLANT
SUT NYLON ETHILON 5-0 P-3 1X18 (SUTURE) IMPLANT
SUT PLAIN 4 0 ~~LOC~~ 1 (SUTURE) IMPLANT
SUT SILK 2 0 FS (SUTURE) ×2 IMPLANT
SUT VIC AB 2-0 CP2 18 (SUTURE) ×2 IMPLANT
SUT VIC AB 4-0 P-3 18X BRD (SUTURE) IMPLANT
SUT VIC AB 4-0 P3 18 (SUTURE)
SUT VICRYL 4-0 PS2 18IN ABS (SUTURE) ×2 IMPLANT
SYR 20ML ECCENTRIC (SYRINGE) ×2 IMPLANT
SYR CONTROL 10ML LL (SYRINGE) ×2 IMPLANT
TOWEL OR 17X24 6PK STRL BLUE (TOWEL DISPOSABLE) ×2 IMPLANT
TOWEL OR 17X26 10 PK STRL BLUE (TOWEL DISPOSABLE) ×2 IMPLANT
TRAY ENT MC OR (CUSTOM PROCEDURE TRAY) ×4 IMPLANT
TRAY FOLEY CATH 14FRSI W/METER (CATHETERS) ×2 IMPLANT
WATER STERILE IRR 1000ML POUR (IV SOLUTION) ×4 IMPLANT

## 2011-12-31 NOTE — H&P (Signed)
John Sampson is an 76 y.o. male.   Chief Complaint: Visual field disturbance HPI: Patient is a 76 year old GEN who noted blurriness of his vision and loss of some of his peripheral vision during his recovery from cataract surgery. There were no, cages was cataract surgery he follows up with his ophthalmologist underwent visual field analysis and was noted to have a bitemporal visual field cut. Patient was referred to a neurologist who completed a workup to include an MRI scan of his brain which revealed a pituitary mass with extension suprasellar and compressing the undersurface of his optic chiasm. Endocrine workup revealed no endocrinopathy. Patient denies any significant headaches other constitutional symptoms.  Past Medical History  Diagnosis Date  . Shortness of breath   . COPD (chronic obstructive pulmonary disease)   . Hypertension   . GERD (gastroesophageal reflux disease)   . Headache   . Coronary artery disease     SEE DR Alanda Amass...  . Myocardial infarction     Past Surgical History  Procedure Date  . Coronary artery bypass graft     2003  X  5 GRAFTS  . Eye surgery     06/2011  . Tonsillectomy     AGE 89    History reviewed. No pertinent family history. Social History:  reports that he quit smoking about 9 years ago. He does not have any smokeless tobacco history on file. He reports that he does not drink alcohol or use illicit drugs.  Allergies: No Known Allergies  Medications Prior to Admission  Medication Dose Route Frequency Provider Last Rate Last Dose  . bacitracin 16109 UNITS injection           . ceFAZolin (ANCEF) 2-3 GM-% IVPB SOLR           . ceFAZolin (ANCEF) IVPB 2 g/50 mL premix  2 g Intravenous 60 min Pre-Op Mariam Dollar, MD      . sodium chloride 0.9 % infusion           . DISCONTD: ceFAZolin (ANCEF) IVPB 1 g/50 mL premix  1 g Intravenous 60 min Pre-Op Kerby Nora, MD      . DISCONTD: ceFAZolin (ANCEF) IVPB 1 g/50 mL premix  1 g Intravenous 60 min  Pre-Op Mariam Dollar, MD       No current outpatient prescriptions on file as of 12/31/2011.    No results found for this or any previous visit (from the past 48 hour(s)). No results found.  Review of Systems  Constitutional: Negative.   HENT: Negative.   Eyes: Positive for blurred vision and double vision.  Respiratory: Negative.   Cardiovascular: Negative.   Gastrointestinal: Negative.   Genitourinary: Negative.   Musculoskeletal: Negative.   Skin: Negative.   Neurological: Negative.   Endo/Heme/Allergies: Negative.   Psychiatric/Behavioral: Negative.     Blood pressure 168/79, pulse 71, temperature 98.7 F (37.1 C), temperature source Oral, resp. rate 18, SpO2 96.00%. Physical Exam  Constitutional: He is oriented to person, place, and time. He appears well-developed and well-nourished.  HENT:  Head: Normocephalic and atraumatic.  Eyes: Pupils are equal, round, and reactive to light.  Neck: Neck supple.  Respiratory: Breath sounds normal.  GI: Soft.  Neurological: He is alert and oriented to person, place, and time. He has normal strength and normal reflexes. A cranial nerve deficit is present. GCS eye subscore is 4. GCS verbal subscore is 5. GCS motor subscore is 6.       The patient is  awake alert oriented x4 he has a bi- temporal visual field deficit otherwise cranial nerves intact. Strength is 5 out of 5 in his upper and lower extremities     Assessment/Plan 76 year old on with what appears to be a pituitary macroadenoma. His mass in the sella with extension underneath and compressing his optic chiasm has been recommended transsphenoidal hypophysis ectomy for decompression of his sella and optic chiasm. The risks and benefits of the operation as well as alternatives of surgery, expectations of outcome, and perioperative course of all been explained to the patient and family they understand and agree to proceed forward.  John Sampson P 12/31/2011, 7:12 AM

## 2011-12-31 NOTE — Transfer of Care (Signed)
Immediate Anesthesia Transfer of Care Note  Patient: John Sampson  Procedure(s) Performed:  CRANIOTOMY HYPOPHYSECTOMY TRANSNASAL APPROACH - Transphenoidal Hypophysectomy With Fat Graft Harvest from right abdomen Dr. Narda Bonds to assist ; TRANSNASAL APPROACH - Transnasal Approach  Patient Location: PACU  Anesthesia Type: General  Level of Consciousness: sedated, patient cooperative and responds to stimulation  Airway & Oxygen Therapy: Patient Spontanous Breathing and Patient connected to face mask oxygen  Post-op Assessment: Report given to PACU RN and Post -op Vital signs reviewed and stable  Post vital signs: Reviewed  Complications: No apparent anesthesia complications

## 2011-12-31 NOTE — OR Nursing (Signed)
Procedure #2 with Dr. Wynetta Emery started at 0920 after Dr. Ezzard Standing opened

## 2011-12-31 NOTE — Anesthesia Postprocedure Evaluation (Signed)
Anesthesia Post Note  Patient: John Sampson  Procedure(s) Performed:  CRANIOTOMY HYPOPHYSECTOMY TRANSNASAL APPROACH - Transphenoidal Hypophysectomy With Fat Graft Harvest from right abdomen Dr. Narda Bonds to assist ; TRANSNASAL APPROACH - Transnasal Approach  Anesthesia type: general  Patient location: PACU  Post pain: Pain level controlled  Post assessment: Patient's Cardiovascular Status Stable  Last Vitals:  Filed Vitals:   12/31/11 1216  BP:   Pulse: 65  Temp:   Resp: 22    Post vital signs: Reviewed and stable  Level of consciousness: sedated  Complications: No apparent anesthesia complications

## 2011-12-31 NOTE — Preoperative (Signed)
Beta Blockers   Reason not to administer Beta Blockers:Not Applicable, took Toprol XL this am

## 2011-12-31 NOTE — Anesthesia Preprocedure Evaluation (Signed)
Anesthesia Evaluation  Patient identified by MRN, date of birth, ID band Patient awake    Reviewed: Allergy & Precautions, H&P , NPO status , Patient's Chart, lab work & pertinent test results, reviewed documented beta blocker date and time   Airway       Dental   Pulmonary shortness of breath and with exertion, COPD COPD inhaler,    + wheezing      Cardiovascular hypertension, Pt. on medications and Pt. on home beta blockers + CAD and + Past MI Regular Normal- Systolic murmurs    Neuro/Psych  Headaches,    GI/Hepatic Neg liver ROS, GERD-  Medicated and Controlled,  Endo/Other  Morbid obesity  Renal/GU negative Renal ROS     Musculoskeletal   Abdominal   Peds  Hematology   Anesthesia Other Findings   Reproductive/Obstetrics                           Anesthesia Physical Anesthesia Plan  ASA: III  Anesthesia Plan: General   Post-op Pain Management:    Induction: Intravenous  Airway Management Planned: Oral ETT  Additional Equipment: CVP and Arterial line  Intra-op Plan:   Post-operative Plan: Possible Post-op intubation/ventilation  Informed Consent:   Plan Discussed with: CRNA, Anesthesiologist and Surgeon  Anesthesia Plan Comments:         Anesthesia Quick Evaluation

## 2011-12-31 NOTE — Brief Op Note (Signed)
12/31/2011  10:44 AM  PATIENT:  John Sampson  76 y.o. male  PRE-OPERATIVE DIAGNOSIS:  pituitary tumor  POST-OPERATIVE DIAGNOSIS:  pituitary tumor  PROCEDURE:  Procedure(s): CRANIOTOMY HYPOPHYSECTOMY TRANSNASAL APPROACH TRANSNASAL APPROACH  SURGEON:  Surgeon(s): Mariam Dollar, MD Drema Halon, MD  PHYSICIAN ASSISTANT:   ASSISTANTS: none   ANESTHESIA:   general  EBL:  Total I/O In: 1500 [I.V.:1500] Out: 240 [Urine:140; Blood:100]  BLOOD ADMINISTERED:none  DRAINS: none   LOCAL MEDICATIONS USED:  LIDOCAINE w/ Epi 12CC  SPECIMEN:  Source of Specimen:  pituitary gland  DISPOSITION OF SPECIMEN:  PATHOLOGY  COUNTS:  YES  TOURNIQUET:  * No tourniquets in log *  DICTATION: .Other Dictation: Dictation Number [id number]  PLAN OF CARE: Admit to inpatient   PATIENT DISPOSITION:  PACU - hemodynamically stable.   Delay start of Pharmacological VTE agent (>24hrs) due to surgical blood loss or risk of bleeding:  {YES/NO/NOT APPLICABLE:20182

## 2011-12-31 NOTE — Op Note (Signed)
Preoperative diagnosis: Sellar mass  Postoperative diagnosis: Pituitary macroadenoma  Procedure: Transnasal approach for Transsphenoidal hypophysectomy for resection of pituitary macroadenoma with abdominal fat graft harvest  Surgeon: Dr. Jillyn Hidden Tara Wich  Co-surgeon: Dr. Narda Bonds  Assistant: Dr. Julio Sicks  Anesthesia: Gen.  EBL: Minimal  History of present illness: Patient is a very pleasant 76 year old gentleman who presented with visual field deficit workup revealed a sellar mass consistent with pituitary macroadenoma. Workup revealed it to be a nonsecreting to repair due to patient's visual field deficit size location tumor is recommended transfer to hypophysectomy went over the risks and benefits of the operation with him he understands and agreed to proceed forward. Wrist benefits, alternatives to surgery, expectations of outcome, and perioperative course were all spine the patient and he agreed to proceed forward.  Operative procedure: Patient brought in the or was induced under general anesthesia and positioned supine with Mayfield head pins and fluoroscopy for a transnasal approach to transsphenoidal ostectomy. After exposure had been obtained by Dr. Dillard Cannon dictation which will be in a separate operative note, the tumor was immediately visualized in the sphenoid still partially attached to the septum on the right side of the sphenoid septum was resected mucosa was removed and some additional tumor was immediately sent for frozen specimen. This is came back consistent with pituitary macroadenoma. The dura was then opened up widely and cruciate fashion after it was correlate with bipolar light cautery. C-arm fluoroscopy confirmed anterior posterior margins of the sella. A large amount of necrotic-appearing tissue medially was expressed and sent for permanent section with micropituitary's. And then using a combination of ring curettes the sella was explored and a 360 orientation  moving all necrotic material and gross abnormal tumor. The diaphragm collapsed into the field and again further exploration and a 360 orientation confirmed complete excision of all abnormal tissue. There was good pulsations of the sellar floor Valsalva was performed freeing up some additional tissue that was removed. Then the wound was copious irrigated and packed with Gelfoam and attention second harvesting of the graft. Pulses was made in the right lower quadrant of his abdomen after infiltration of 5 cc lidocaine with epi. Abdominal fat was then harvested and that was closed with interrupted Vicryl and a running 4 subcuticular Dermabond Telfa and dressed. Then attention was taken back to the sella. A small piece of cartilage was cut and fashioned to recreate and partial floor Surgifoam was used for meticulous hemostasis and fat was packed in the sphenoid. The operation was then turned back over to Dr. Ezzard Standing for closure of the transnasal approach. At the end of the neurosurgical part of case all needle counts sponge counts were correct.

## 2012-01-01 ENCOUNTER — Other Ambulatory Visit: Payer: Self-pay

## 2012-01-01 LAB — BASIC METABOLIC PANEL
BUN: 11 mg/dL (ref 6–23)
Chloride: 103 mEq/L (ref 96–112)
Creatinine, Ser: 0.72 mg/dL (ref 0.50–1.35)
GFR calc Af Amer: >90 mL/min (ref >90)

## 2012-01-01 MED ORDER — PANTOPRAZOLE SODIUM 40 MG PO TBEC
40.0000 mg | DELAYED_RELEASE_TABLET | Freq: Every day | ORAL | Status: DC
  Administered 2012-01-01 – 2012-01-04 (×4): 40 mg via ORAL
  Filled 2012-01-01 (×4): qty 1

## 2012-01-01 NOTE — Progress Notes (Signed)
EKG done and faxed to [facility].

## 2012-01-01 NOTE — Progress Notes (Signed)
POD #1 AF  VSS    Pat. Alert and oriented. No complaints except trouble sleeping. Few POs Nasal packing intact w/o drainage or bleeding. Will plan on removing nasal packing Mon AM Stable post op

## 2012-01-01 NOTE — Progress Notes (Signed)
Order received for EKG and Troponin levels. Pt remain comfortable in bed.

## 2012-01-01 NOTE — Progress Notes (Signed)
Subjective: Patient reports Feels fair nasal congestion bothersome  Objective: Vital signs in last 24 hours: Temp:  [96.6 F (35.9 C)-98.1 F (36.7 C)] 97.7 F (36.5 C) (02/02 1200) Pulse Rate:  [45-71] 61  (02/02 0900) Resp:  [16-24] 18  (02/02 0900) BP: (104-176)/(41-82) 122/47 mmHg (02/02 0900) SpO2:  [93 %-99 %] 93 % (02/02 0900) Arterial Line BP: (82-173)/(55-91) 159/58 mmHg (02/02 0900) FiO2 (%):  [42 %] 42 % (02/02 0700)  Intake/Output from previous day: 02/01 0701 - 02/02 0700 In: 3254.5 [P.O.:60; I.V.:2872.5; IV Piggyback:322] Out: 2245 [Urine:2145; Blood:100] Intake/Output this shift: Total I/O In: 750 [I.V.:750] Out: -   Pupils 3 mm briskly reactive to light and accommodation extraocular movements are full  Lab Results:  Basename 12/31/11 1300  WBC 12.3*  HGB 11.8*  HCT 37.3*  PLT 302   BMET  Basename 01/01/12 0719 01/01/12 0119 12/31/11 1300  NA 138 136 --  K -- 3.7 4.2  CL -- 103 107  CO2 -- 27 22  GLUCOSE -- 143* 162*  BUN -- 11 12  CREATININE -- 0.72 0.71  CALCIUM -- 8.9 8.5    Studies/Results: Dg Sella Turcica  12/31/2011  *RADIOLOGY REPORT*  Clinical Data: Transphenoidal hypophysectomy  SELLA TURCICA  Comparison: MRI of the brain 09/28/2011.  Findings: Transphenoidal access is present.  Three images are submitted.  A surgical probe is initially projected along the posterior aspect of the sella.  The second image shows the probe at the anterior border or just anterior to the sella.  On the third image, the probe is again posterior, but slightly more inferior.  IMPRESSION: Intraoperative localization of the sphenoid sinus.  Original Report Authenticated By: Jamesetta Orleans. MATTERN, M.D.   Dg Chest 1 View  12/31/2011  *RADIOLOGY REPORT*  Clinical Data: Central line placement.  CHEST - 1 VIEW  Comparison: Chest 12/24/2011.  Findings: The patient has a right IJ catheter with the tip in the lower superior vena cava.  There is no pneumothorax.  Cardiomegaly  and bilateral alveolar and interstitial opacities are most consistent with pulmonary edema.  Left basilar airspace disease is likely due to atelectasis.  IMPRESSION:  1.  Right IJ catheter in good position.  No pneumothorax. 2.  Cardiomegaly and pulmonary edema. 3.  Left basilar airspace opacity is likely due to atelectasis.  Original Report Authenticated By: Bernadene Bell. Maricela Curet, M.D.    Assessment/Plan: Stable postop day 1  LOS: 1 day  DC A-line and central catheter ambulate patient no evidence of GI change sodiums to daily   Fany Cavanaugh J 01/01/2012, 1:36 PM

## 2012-01-01 NOTE — Op Note (Signed)
NAMEKALMAN, NYLEN NO.:  [id number]  MEDICAL RECORD NO.:  [id number]  LOCATION:  3104                         FACILITY:  [facility]  PHYSICIAN:  Kristine Garbe. Ezzard Standing, M.D.DATE OF BIRTH:  [birth year]  DATE OF PROCEDURE:  12/31/2011 DATE OF DISCHARGE:                              OPERATIVE REPORT   PREOPERATIVE DIAGNOSIS:  Pituitary microadenoma.  POSTOPERATIVE DIAGNOSIS:  Pituitary microadenoma.  OPERATION PERFORMED:  Transseptal transsphenoidal resection of pituitary tumor.  SURGEON:  Kristine Garbe. Ezzard Standing, MD  CO-SURGEON:  Donalee Citrin, MD  ANESTHESIA:  General endotracheal.  COMPLICATIONS:  None.  BRIEF CLINICAL NOTE:  John Sampson is a 76 year old gentleman who has had recent cataract surgery.  Following this, he was having some visual problems and a workup of his visual problems revealed a pituitary mass measuring approximately 2.5 cm consistent with pituitary microadenoma, pushing on the optic nerve.  He was taken to the operating room at this time for transseptal transsphenoidal resection of pituitary adenoma.  DESCRIPTION OF PROCEDURE:  After adequate general endotracheal anesthesia, the patient was positioned and pinned by Dr. Wynetta Emery.  I subsequently prepped the nose with Betadine and the nose was then further prepped with cotton pledgets soaked in Afrin and floor of the nose was injected with Xylocaine with epinephrine, approximately 12 mL. These areas were then draped out.  I began the approach to the right nostril with an extended hemitransfixion incision along the cartilaginous septum extending onto the floor of the nose on the right side.  Mucoperiosteal mucoperichondrial flaps were elevated off the floor of the nose on the right side of the septum.  In addition, mucoperichondrial mucoperiosteal flap was elevated on floor of the nose on the left side.  The anterior 2 to 2.5 cm of cartilage was preserved and this was reflected off of the maxillary  crest into the left airway. A vertical incision was made just in front of the bony portion of the septum and the mucoperiosteal flaps were elevated on either side posterior to this vertical 2.5 cm incision through the cartilage.  After elevating mucoperichondrial mucoperiosteal flaps on either side, the cartilage and some of the bone was sent in saline to be used for closure by Dr. Wynetta Emery.  Of note, the patient had a large septal spur that protruded into the left airway, and some of the mucosa was torn into the left airway, where this large septal spur protruded on the left side. Mucoperiosteal flaps were elevated to the face of the sphenoid sinus. The Hardy retractor was then placed so as to  exposed the floor on anterior surface of the sphenoid sinus.  Using the small 2 and 4-mm osteotomes, the sphenoid sinus was entered.  The septum of the sphenoid sinus was protruded to the right side.  The septum was taken down anteriorly and the tumor could be seen extending into the septum of the sphenoid sinus.  The microscope was brought into position, and was utilized by Dr. Wynetta Emery for resection of pituitary tumor.  Following resection of the pituitary tumor and obtaining abdominal fat graft, all performed by Dr. Wynetta Emery, I was called back in for closure.  This sphenoid sinus was packed  with fat.  There was no obvious leak or significant bleeding.  Cartilaginous septum was brought back onto the maxillary crest in midline.  Mucoperichondrium mucoperiosteal flaps were brought back to midline.  The extended hemitransfixion incision was closed with interrupted 4-0 chromic sutures.  The septum was then basted with another 4-0 chromic suture and then dural splints were secured to either side of the septum with a 3-0 nylon suture.  The nose was then packed with Merocel packs, placed on either side of the septum.  Soaked in bacitracin ointment and hydrated with saline.  Oropharynx was suctioned of any blood  clot.  There is no active bleeding noted.  The patient was subsequently awoke from anesthesia, and transferred to recovery room postop, doing well.          ______________________________ Kristine Garbe. Ezzard Standing, M.D.     CEN/MEDQ  D:  12/31/2011  T:  01/01/2012  Job:  [id number]  cc:   Donalee Citrin, M.D.

## 2012-01-01 NOTE — Progress Notes (Signed)
V Tach  7 runs faxed to [facility] B/P 130/57 Hr 70 RR 20 Sat 94 % Fi02 @ 42%. Pt denies discomfort, pt stated " I'm ok".

## 2012-01-02 LAB — SODIUM: Sodium: 137 mEq/L (ref 135–145)

## 2012-01-02 MED ORDER — LEVETIRACETAM 500 MG PO TABS
500.0000 mg | ORAL_TABLET | Freq: Two times a day (BID) | ORAL | Status: DC
  Administered 2012-01-02 – 2012-01-04 (×4): 500 mg via ORAL
  Filled 2012-01-02 (×5): qty 1

## 2012-01-02 NOTE — Progress Notes (Signed)
POD #2 VSS. No c/o.  Packing intact with minimal drainage. Will plan on removing nasal packs tomorrow AM

## 2012-01-02 NOTE — Progress Notes (Signed)
Subjective: Patient reports Offers no complaints minimal headache vision appears stable  Objective: Vital signs in last 24 hours: Temp:  [96.5 F (35.8 C)-97.9 F (36.6 C)] 96.5 F (35.8 C) (02/03 1206) Pulse Rate:  [51-98] 58  (02/03 1300) Resp:  [13-24] 16  (02/03 1300) BP: (121-166)/(38-103) 134/49 mmHg (02/03 1300) SpO2:  [91 %-100 %] 95 % (02/03 1300) FiO2 (%):  [40 %] 40 % (02/03 0700) Weight:  [114.8 kg (253 lb 1.4 oz)] 114.8 kg (253 lb 1.4 oz) (02/03 0500)  Intake/Output from previous day: 02/02 0701 - 02/03 0700 In: 1905 [P.O.:720; I.V.:975; IV Piggyback:210] Out: 1475 [Urine:1475] Intake/Output this shift: Total I/O In: 585 [P.O.:480; IV Piggyback:105] Out: 185 [Urine:185]  Alert oriented pupils 3 mm briskly reactive no appreciable field cut on confrontational exam  Lab Results:  Basename 12/31/11 1300  WBC 12.3*  HGB 11.8*  HCT 37.3*  PLT 302   BMET  Basename 01/02/12 0453 01/01/12 0719 01/01/12 0119 12/31/11 1300  NA 137 138 -- --  K -- -- 3.7 4.2  CL -- -- 103 107  CO2 -- -- 27 22  GLUCOSE -- -- 143* 162*  BUN -- -- 11 12  CREATININE -- -- 0.72 0.71  CALCIUM -- -- 8.9 8.5    Studies/Results: No results found.  Assessment/Plan: Electrolytes normal no evidence of diabetes insipidus. Taking oral liquids adequately.  LOS: 2 days  Continue antibiotics and observe for DI mobilize as tolerated   Spenser Harren J 01/02/2012, 3:34 PM

## 2012-01-03 ENCOUNTER — Encounter (HOSPITAL_COMMUNITY): Payer: Self-pay | Admitting: Neurosurgery

## 2012-01-03 LAB — SODIUM: Sodium: 136 mEq/L (ref 135–145)

## 2012-01-03 NOTE — Progress Notes (Signed)
UR COMPLETED  

## 2012-01-03 NOTE — Progress Notes (Signed)
POD #3 VSS Doing well. Nasal packing removed. Possible discharge tomorrow. Will have patient followup in my office this Fri at 4

## 2012-01-03 NOTE — Progress Notes (Signed)
Subjective: Patient reports It is doing well no significant headache its isn't improved only aggravating him as a packing.  Objective: Vital signs in last 24 hours: Temp:  [96.5 F (35.8 C)-98.2 F (36.8 C)] 97.3 F (36.3 C) (02/04 0400) Pulse Rate:  [51-98] 53  (02/04 0700) Resp:  [13-21] 17  (02/04 0700) BP: (110-156)/(38-103) 122/52 mmHg (02/04 0700) SpO2:  [91 %-98 %] 96 % (02/04 0700) FiO2 (%):  [40 %] 40 % (02/04 0700) Weight:  [109.3 kg (240 lb 15.4 oz)] 109.3 kg (240 lb 15.4 oz) (02/04 0500)  Intake/Output from previous day: 02/03 0701 - 02/04 0700 In: 1305 [P.O.:1200; IV Piggyback:105] Out: 1750 [Urine:1750] Intake/Output this shift:    Awake alert oriented x4 strength 5 out of 5 upper and lower extremities no significant drainage from his nasal packing  Lab Results:  Basename 12/31/11 1300  WBC 12.3*  HGB 11.8*  HCT 37.3*  PLT 302   BMET  Basename 01/03/12 0450 01/02/12 0453 01/01/12 0119 12/31/11 1300  NA 136 137 -- --  K -- -- 3.7 4.2  CL -- -- 103 107  CO2 -- -- 27 22  GLUCOSE -- -- 143* 162*  BUN -- -- 11 12  CREATININE -- -- 0.72 0.71  CALCIUM -- -- 8.9 8.5    Studies/Results: No results found.  Assessment/Plan: Packing to be DC'd this morning per Dr. Ezzard Standing progressive mobilization at that point possible transfer to the floor with possible looks towards discharge tomorrow  LOS: 3 days     Anokhi Shannon P 01/03/2012, 7:25 AM

## 2012-01-03 NOTE — Progress Notes (Signed)
01/03/12- Humidity via face tent changed- new bottle placed.  Pt tolerating face tent well.

## 2012-01-04 MED ORDER — HYDROCODONE-ACETAMINOPHEN 5-325 MG PO TABS: 1.0000 | ORAL_TABLET | ORAL | Status: AC | PRN

## 2012-01-04 NOTE — Progress Notes (Signed)
Subjective: Patient reports Patient did well no complaint  Objective: Vital signs in last 24 hours: Temp:  [97.3 F (36.3 C)-97.8 F (36.6 C)] 97.6 F (36.4 C) (02/05 0700) Pulse Rate:  [45-59] 53  (02/05 0900) Resp:  [14-20] 19  (02/05 0900) BP: (97-138)/(36-58) 113/36 mmHg (02/05 0900) SpO2:  [84 %-97 %] 92 % (02/05 0900) FiO2 (%):  [40 %] 40 % (02/04 1800)  Intake/Output from previous day: 02/04 0701 - 02/05 0700 In: 720 [P.O.:720] Out: 1535 [Urine:1535] Intake/Output this shift: Total I/O In: 120 [P.O.:120] Out: 600 [Urine:600]  Strength 5 out of 5 wound clean and dry vision improved minimal drip drainage from a sinus  Lab Results: No results found for this basename: WBC:2,HGB:2,HCT:2,PLT:2 in the last 72 hours BMET  Basename 01/03/12 0450 01/02/12 0453  NA 136 137  K -- --  CL -- --  CO2 -- --  GLUCOSE -- --  BUN -- --  CREATININE -- --  CALCIUM -- --    Studies/Results: No results found.  Assessment/Plan: Posterior before from transsphenoidal discharge home.  LOS: 4 days     Andri Prestia P 01/04/2012, 10:19 AM

## 2012-01-04 NOTE — Discharge Summary (Signed)
  Physician Discharge Summary  Patient ID: John Sampson MRN: [id number] DOB/AGE: March 08, 1936 76 y.o.  Admit date: 12/31/2011 Discharge date: 01/04/2012  Admission Diagnoses: Pituitary macroadenoma  Discharge Diagnoses: Same Active Problems:  * No active hospital problems. *    Discharged Condition: good  Hospital Course: Patient was admitted hospital underwent transsphenoidal hypophysis ectomy per Dr. Dillard Cannon myself postoperatively patient did very well [employer] room and the ICU in the ICU patient convalesced well was voiding well with no signs of diabetes insipidus the steroids were weaned. He is progressively mobilized over the weekend in the ICU with stable vital signs and improved peripheral vision. Sodium levels remain normal his Foley was able to take down this packs were DC'd on Monday patient was progress immobilize transferred to the floor and was a be discharged home on Tuesday postop day 4.  Consults: Significant Diagnostic Studies: Treatments: Transsphenoidal hypophysis ectomy Discharge Exam: Blood pressure 113/36, pulse 53, temperature 97.6 F (36.4 C), temperature source Axillary, resp. rate 19, height 5\' 9"  (1.753 m), weight 109.3 kg (240 lb 15.4 oz), SpO2 92.00%. Awake alert oriented x4 mild bitemporal field cut improved from preop strength 5 out of 5  Disposition: Home   Medication List  As of 01/04/2012 10:17 AM   TAKE these medications         albuterol-ipratropium 18-103 MCG/ACT inhaler   Commonly known as: COMBIVENT   Inhale 2 puffs into the lungs every 6 (six) hours as needed.      aspirin EC 81 MG tablet   Take 162 mg by mouth daily.      diltiazem 180 MG 24 hr capsule   Commonly known as: DILACOR XR   Take 180 mg by mouth daily.      fish oil-omega-3 fatty acids 1000 MG capsule   Take 2 g by mouth daily.      hydrochlorothiazide 25 MG tablet   Commonly known as: HYDRODIURIL   Take 25 mg by mouth daily.      HYDROcodone-acetaminophen 5-325  MG per tablet   Commonly known as: NORCO   Take 1 tablet by mouth every 4 (four) hours as needed.      lisinopril 40 MG tablet   Commonly known as: PRINIVIL,ZESTRIL   Take 20 mg by mouth daily.      metoprolol succinate 50 MG 24 hr tablet   Commonly known as: TOPROL-XL   Take 25 mg by mouth daily. Take with or immediately following a meal.      omeprazole 20 MG capsule   Commonly known as: PRILOSEC   Take 20 mg by mouth daily.      simvastatin 10 MG tablet   Commonly known as: ZOCOR   Take 10 mg by mouth at bedtime.           Follow-up Information    Follow up with Drema Halon, MD on 01/07/2012. (4:00 PM, Friday, Feb. 8, 2013)    Contact information:   Individual [address] [place] [address] [phone]       Follow up with [facility] P, MD.   Contact information:   [address] [place] [address] [phone]          Signed: Mariam Dollar 01/04/2012, 10:17 AM

## 2012-02-22 DIAGNOSIS — I6529 Occlusion and stenosis of unspecified carotid artery: Secondary | ICD-10-CM | POA: Diagnosis not present

## 2012-03-17 DIAGNOSIS — I10 Essential (primary) hypertension: Secondary | ICD-10-CM | POA: Diagnosis not present

## 2012-03-17 DIAGNOSIS — I2510 Atherosclerotic heart disease of native coronary artery without angina pectoris: Secondary | ICD-10-CM | POA: Diagnosis not present

## 2012-03-17 DIAGNOSIS — R609 Edema, unspecified: Secondary | ICD-10-CM | POA: Diagnosis not present

## 2012-03-17 DIAGNOSIS — E782 Mixed hyperlipidemia: Secondary | ICD-10-CM | POA: Diagnosis not present

## 2012-03-21 DIAGNOSIS — R5381 Other malaise: Secondary | ICD-10-CM | POA: Diagnosis not present

## 2012-03-21 DIAGNOSIS — Z79899 Other long term (current) drug therapy: Secondary | ICD-10-CM | POA: Diagnosis not present

## 2012-03-21 DIAGNOSIS — R5383 Other fatigue: Secondary | ICD-10-CM | POA: Diagnosis not present

## 2012-03-21 DIAGNOSIS — E039 Hypothyroidism, unspecified: Secondary | ICD-10-CM | POA: Diagnosis not present

## 2012-05-12 DIAGNOSIS — H2510 Age-related nuclear cataract, unspecified eye: Secondary | ICD-10-CM | POA: Diagnosis not present

## 2012-06-19 DIAGNOSIS — E782 Mixed hyperlipidemia: Secondary | ICD-10-CM | POA: Diagnosis not present

## 2012-06-19 DIAGNOSIS — Z951 Presence of aortocoronary bypass graft: Secondary | ICD-10-CM | POA: Diagnosis not present

## 2012-06-19 DIAGNOSIS — I2510 Atherosclerotic heart disease of native coronary artery without angina pectoris: Secondary | ICD-10-CM | POA: Diagnosis not present

## 2012-06-27 DIAGNOSIS — R5383 Other fatigue: Secondary | ICD-10-CM | POA: Diagnosis not present

## 2012-06-27 DIAGNOSIS — Z79899 Other long term (current) drug therapy: Secondary | ICD-10-CM | POA: Diagnosis not present

## 2012-06-27 DIAGNOSIS — E782 Mixed hyperlipidemia: Secondary | ICD-10-CM | POA: Diagnosis not present

## 2012-06-27 DIAGNOSIS — R5381 Other malaise: Secondary | ICD-10-CM | POA: Diagnosis not present

## 2012-07-03 DIAGNOSIS — H43819 Vitreous degeneration, unspecified eye: Secondary | ICD-10-CM | POA: Diagnosis not present

## 2012-07-03 DIAGNOSIS — H2510 Age-related nuclear cataract, unspecified eye: Secondary | ICD-10-CM | POA: Diagnosis not present

## 2012-07-03 DIAGNOSIS — H348392 Tributary (branch) retinal vein occlusion, unspecified eye, stable: Secondary | ICD-10-CM | POA: Diagnosis not present

## 2012-07-03 DIAGNOSIS — H3560 Retinal hemorrhage, unspecified eye: Secondary | ICD-10-CM | POA: Diagnosis not present

## 2012-07-06 DIAGNOSIS — D539 Nutritional anemia, unspecified: Secondary | ICD-10-CM | POA: Diagnosis not present

## 2012-07-06 DIAGNOSIS — D649 Anemia, unspecified: Secondary | ICD-10-CM | POA: Diagnosis not present

## 2012-07-06 DIAGNOSIS — D500 Iron deficiency anemia secondary to blood loss (chronic): Secondary | ICD-10-CM | POA: Diagnosis not present

## 2012-07-14 ENCOUNTER — Encounter (INDEPENDENT_AMBULATORY_CARE_PROVIDER_SITE_OTHER): Payer: Self-pay | Admitting: *Deleted

## 2012-08-03 ENCOUNTER — Encounter (INDEPENDENT_AMBULATORY_CARE_PROVIDER_SITE_OTHER): Payer: Self-pay | Admitting: Internal Medicine

## 2012-08-03 ENCOUNTER — Telehealth (INDEPENDENT_AMBULATORY_CARE_PROVIDER_SITE_OTHER): Payer: Self-pay | Admitting: *Deleted

## 2012-08-03 ENCOUNTER — Other Ambulatory Visit (INDEPENDENT_AMBULATORY_CARE_PROVIDER_SITE_OTHER): Payer: Self-pay | Admitting: *Deleted

## 2012-08-03 ENCOUNTER — Ambulatory Visit (INDEPENDENT_AMBULATORY_CARE_PROVIDER_SITE_OTHER): Payer: Medicare Other | Admitting: Internal Medicine

## 2012-08-03 VITALS — BP 128/58 | HR 72 | Temp 98.2°F | Ht 69.0 in | Wt 250.5 lb

## 2012-08-03 DIAGNOSIS — D649 Anemia, unspecified: Secondary | ICD-10-CM | POA: Insufficient documentation

## 2012-08-03 DIAGNOSIS — I10 Essential (primary) hypertension: Secondary | ICD-10-CM | POA: Insufficient documentation

## 2012-08-03 DIAGNOSIS — Z951 Presence of aortocoronary bypass graft: Secondary | ICD-10-CM | POA: Insufficient documentation

## 2012-08-03 DIAGNOSIS — I252 Old myocardial infarction: Secondary | ICD-10-CM

## 2012-08-03 DIAGNOSIS — E7800 Pure hypercholesterolemia, unspecified: Secondary | ICD-10-CM | POA: Diagnosis not present

## 2012-08-03 DIAGNOSIS — K625 Hemorrhage of anus and rectum: Secondary | ICD-10-CM

## 2012-08-03 DIAGNOSIS — K921 Melena: Secondary | ICD-10-CM | POA: Insufficient documentation

## 2012-08-03 DIAGNOSIS — E782 Mixed hyperlipidemia: Secondary | ICD-10-CM | POA: Insufficient documentation

## 2012-08-03 MED ORDER — PEG-KCL-NACL-NASULF-NA ASC-C 100 G PO SOLR: 1.0000 | Freq: Once | ORAL | Status: DC

## 2012-08-03 NOTE — Progress Notes (Signed)
Subjective:     Patient ID: John Sampson, male   DOB: December 21, 1935, 76 y.o.   MRN: [id number]  HPIReferred by Dr. Alanda Amass for anemia. He denies prior hx of anemia. Stool cards were positive at Dr. Kandis Cocking office. He has never undergone a colonoscopy in the past.  There is no family hx of colon cancer. Appetite is good. No weight loss. No abdominal pain. He usually has a BM one a day. H denies any rectal bleeding or melena. Past hx significant for cardiac disease.  Recent craniotomy hypophysectomy for a pituitary tumor which was benign in 12/2011.    06/28/2012 H and H 10.1 and 32.3, MCV 72.7, Platelet ct 306, 07/07/2012 Iron 17, UIBC 469, TIBC 486, % Sat 3, Vitamin B12 891, Folate 6.1, Ferritin 8.   Review of Systems see hpi Current Outpatient Prescriptions  Medication Sig Dispense Refill  . albuterol-ipratropium (COMBIVENT) 18-103 MCG/ACT inhaler Inhale 2 puffs into the lungs every 6 (six) hours as needed.      [name] aspirin EC 81 MG tablet Take 162 mg by mouth daily.      [name] diltiazem (DILACOR XR) 180 MG 24 hr capsule Take 180 mg by mouth daily.      . fish oil-omega-3 fatty acids 1000 MG capsule Take 2 g by mouth daily.      . hydrochlorothiazide (HYDRODIURIL) 25 MG tablet Take 25 mg by mouth daily.      [name] lisinopril (PRINIVIL,ZESTRIL) 40 MG tablet Take 20 mg by mouth daily.      . metoprolol succinate (TOPROL-XL) 50 MG 24 hr tablet Take 25 mg by mouth daily. Take with or immediately following a meal.      . Multiple Vitamin (MULTIVITAMIN) tablet Take 1 tablet by mouth daily.      [name] omeprazole (PRILOSEC) 20 MG capsule Take 20 mg by mouth daily.      . potassium chloride (MICRO-K) 10 MEQ CR capsule Take 10 mEq by mouth 2 (two) times daily.      . simvastatin (ZOCOR) 10 MG tablet Take 10 mg by mouth at bedtime.       Past Medical History  Diagnosis Date  . Shortness of breath   . COPD (chronic obstructive pulmonary disease)   . Hypertension   . GERD (gastroesophageal reflux disease)   .  Headache   . Coronary artery disease     SEE DR Alanda Amass...  . Myocardial infarction    Past Surgical History  Procedure Date  . Coronary artery bypass graft     2003  X  5 GRAFTS  . Eye surgery     06/2011  . Tonsillectomy     AGE 37  . Craniotomy 12/31/2011    Procedure: CRANIOTOMY HYPOPHYSECTOMY TRANSNASAL APPROACH;  Surgeon: Mariam Dollar, MD;  Location: [facility];  Service: Neurosurgery;  Laterality: N/A;  Transphenoidal Hypophysectomy With Fat Graft Harvest from right abdomen Dr. Narda Bonds to assist    Family Status  Relation Status Death Age  . Mother Deceased     CAD, age 1  . Father Deceased     age 66   History   Social History  . Marital Status: Widowed    Spouse Name: N/A    Number of Children: N/A  . Years of Education: N/A   Occupational History  . Not on file.   Social History Main Topics  . Smoking status: Former Smoker -- 3.0 packs/day for 45 years    Quit date: 01/23/2002  .  Smokeless tobacco: Not on file   Comment: Quit 11 yrs ago  . Alcohol Use: No  . Drug Use: No  . Sexually Active: Not on file   Other Topics Concern  . Not on file   Social History Narrative  . No narrative on file   No Known Allergies      Objective:   Physical Exam Filed Vitals:   08/03/12 1007  Height: 5\' 9"  (1.753 m)  Weight: 250 lb 8 oz (113.626 kg)   Filed Vitals:   08/03/12 1007  BP: 128/58  Pulse: 72  Temp: 98.2 F (36.8 C)   Alert and oriented. Skin warm and dry. Oral mucosa is moist.   . Sclera anicteric, conjunctivae is pink. Thyroid not enlarged. No cervical lymphadenopathy. Lungs clear. Heart regular rate and rhythm.  Abdomen is soft. Bowel sounds are positive. No hepatomegaly. No abdominal masses felt. No tenderness.  No edema to lower extremities.       Assessment:    Anemia. Colonic neoplasm needs to be ruled out. If normal, PUD needs to be ruled out    Plan:    Colonscopy, if normal, EGD. The risks and benefits such as perforation,  bleeding, and infection were reviewed with the patient and is agreeable.

## 2012-08-03 NOTE — Patient Instructions (Addendum)
Colonoscopy, if normal EGD.  The risks and benefits such as perforation, bleeding, and infection were reviewed with the patient and is agreeable.  

## 2012-08-03 NOTE — Telephone Encounter (Signed)
Patient needs movi prep 

## 2012-08-04 ENCOUNTER — Encounter (HOSPITAL_COMMUNITY): Payer: Self-pay | Admitting: Pharmacy Technician

## 2012-08-17 MED ORDER — SODIUM CHLORIDE 0.45 % IV SOLN
INTRAVENOUS | Status: DC
  Administered 2012-08-18: 13:00:00 via INTRAVENOUS

## 2012-08-18 ENCOUNTER — Encounter (HOSPITAL_COMMUNITY)
Admission: RE | Disposition: A | Discharge status: Home / Self Care | Payer: Self-pay | Source: Ambulatory Visit | Attending: Internal Medicine

## 2012-08-18 ENCOUNTER — Encounter (HOSPITAL_COMMUNITY): Payer: Self-pay | Admitting: *Deleted

## 2012-08-18 ENCOUNTER — Ambulatory Visit (HOSPITAL_COMMUNITY)
Admission: RE | Admit: 2012-08-18 | Discharge: 2012-08-18 | Disposition: A | Discharge status: Home / Self Care | Payer: Medicare Other | Source: Ambulatory Visit | Attending: Internal Medicine | Admitting: Internal Medicine

## 2012-08-18 DIAGNOSIS — D126 Benign neoplasm of colon, unspecified: Secondary | ICD-10-CM | POA: Insufficient documentation

## 2012-08-18 DIAGNOSIS — J449 Chronic obstructive pulmonary disease, unspecified: Secondary | ICD-10-CM | POA: Insufficient documentation

## 2012-08-18 DIAGNOSIS — K257 Chronic gastric ulcer without hemorrhage or perforation: Secondary | ICD-10-CM

## 2012-08-18 DIAGNOSIS — D509 Iron deficiency anemia, unspecified: Secondary | ICD-10-CM | POA: Diagnosis not present

## 2012-08-18 DIAGNOSIS — Z01812 Encounter for preprocedural laboratory examination: Secondary | ICD-10-CM | POA: Insufficient documentation

## 2012-08-18 DIAGNOSIS — K5730 Diverticulosis of large intestine without perforation or abscess without bleeding: Secondary | ICD-10-CM | POA: Diagnosis not present

## 2012-08-18 DIAGNOSIS — J4489 Other specified chronic obstructive pulmonary disease: Secondary | ICD-10-CM | POA: Insufficient documentation

## 2012-08-18 DIAGNOSIS — I10 Essential (primary) hypertension: Secondary | ICD-10-CM | POA: Insufficient documentation

## 2012-08-18 DIAGNOSIS — K921 Melena: Secondary | ICD-10-CM | POA: Diagnosis not present

## 2012-08-18 DIAGNOSIS — D128 Benign neoplasm of rectum: Secondary | ICD-10-CM | POA: Insufficient documentation

## 2012-08-18 DIAGNOSIS — K259 Gastric ulcer, unspecified as acute or chronic, without hemorrhage or perforation: Secondary | ICD-10-CM | POA: Insufficient documentation

## 2012-08-18 DIAGNOSIS — D649 Anemia, unspecified: Secondary | ICD-10-CM

## 2012-08-18 DIAGNOSIS — D129 Benign neoplasm of anus and anal canal: Secondary | ICD-10-CM | POA: Diagnosis not present

## 2012-08-18 HISTORY — PX: ESOPHAGOGASTRODUODENOSCOPY: SHX5428

## 2012-08-18 HISTORY — PX: COLONOSCOPY: SHX5424

## 2012-08-18 LAB — HEMOGLOBIN AND HEMATOCRIT, BLOOD: HCT: 34.9 % — ABNORMAL LOW (ref 39.0–52.0)

## 2012-08-18 SURGERY — COLONOSCOPY
Anesthesia: Moderate Sedation

## 2012-08-18 MED ORDER — MIDAZOLAM HCL 5 MG/5ML IJ SOLN
INTRAMUSCULAR | Status: AC
  Filled 2012-08-18: qty 10

## 2012-08-18 MED ORDER — MEPERIDINE HCL 50 MG/ML IJ SOLN
INTRAMUSCULAR | Status: AC
  Filled 2012-08-18: qty 1

## 2012-08-18 MED ORDER — MEPERIDINE HCL 25 MG/ML IJ SOLN
INTRAMUSCULAR | Status: DC | PRN
  Administered 2012-08-18 (×2): 25 mg via INTRAVENOUS

## 2012-08-18 MED ORDER — MIDAZOLAM HCL 5 MG/5ML IJ SOLN
INTRAMUSCULAR | Status: DC | PRN
  Administered 2012-08-18 (×4): 2 mg via INTRAVENOUS

## 2012-08-18 MED ORDER — PANTOPRAZOLE SODIUM 40 MG PO TBEC: 40.0000 mg | DELAYED_RELEASE_TABLET | Freq: Every day | ORAL | Status: DC

## 2012-08-18 MED ORDER — STERILE WATER FOR IRRIGATION IR SOLN
Status: DC | PRN
  Administered 2012-08-18: 13:00:00

## 2012-08-18 MED ORDER — BUTAMBEN-TETRACAINE-BENZOCAINE 2-2-14 % EX AERO
INHALATION_SPRAY | CUTANEOUS | Status: DC | PRN
  Administered 2012-08-18: 2 via TOPICAL

## 2012-08-18 NOTE — Op Note (Signed)
COLONOSCOPY AND EGD PROCEDURE REPORT  PATIENT:  John Sampson  MR#:  [id number] Birthdate:  [birth year], 76 y.o., male Endoscopist:  Dr. Malissa Hippo, MD Referred By:  Dr. Susa Griffins, MD Procedure Date: 08/18/2012  Procedure: Colonoscopy followed by EGD.  Indications:  Patient is 76 year old Caucasian male who was recently found to have heme positive stools and iron deficiency anemia. He denies abdominal pain melena or rectal bleeding. He is chronically on omeprazole for GERD.            Informed Consent:  The risks, benefits, alternatives & imponderables which include, but are not limited to, bleeding, infection, perforation, drug reaction and potential missed lesion have been reviewed.  The potential for biopsy, lesion removal, esophageal dilation, etc. have also been discussed.  Questions have been answered.  All parties agreeable.  Please see history & physical in medical record for more information.  Medications:  Demerol 50 mg IV Versed 8 mg IV Cetacaine spray topically for oropharyngeal anesthesia    COLONOSCOPY Description of procedure:  After a digital rectal exam was performed, that colonoscope was advanced from the anus through the rectum and colon to the area of the cecum, ileocecal valve and appendiceal orifice. The cecum was deeply intubated. These structures were well-seen and photographed for the record. From the level of the cecum and ileocecal valve, the scope was slowly and cautiously withdrawn. The mucosal surfaces were carefully surveyed utilizing scope tip to flexion to facilitate fold flattening as needed. The scope was pulled down into the rectum where a thorough exam including retroflexion was performed.  Findings:   Prep satisfactory. Multiple diverticula at sigmoid colon with few more at ascending colon. Small submucosal lipoma at proximal transverse colon. Two small polyps ablated via cold biopsy and submitted together(cecum and splenic flexure). 8 mm  and 4 mm polyps snared from rectum and submitted together. Small anal papillae.  Therapeutic/Diagnostic Maneuvers Performed:  See above  Complications:  None  Cecal Withdrawal Time:  12  minutes EGD  Description of procedure:  The endoscope was introduced through the mouth and advanced to the second portion of the duodenum without difficulty or limitations. The mucosal surfaces were surveyed very carefully during advancement of the scope and upon withdrawal.  Findings:  Esophagus: Mucosa of the esophagus was normal. Unremarkable GE J.  GEJ:  44 cm Stomach:  Stomach was empty and distended very well with insufflation. Folds in the proximal stomach were normal. Examination mucosa at gastric body was normal. There were two 4-5 mm ulcers at antrum close to pylorus. There was  linear scar and deformity in this area. Pyloric channel was patent. Angularis fundus and cardia were examined by retroflexing the scope in 2 small fundal hyperplastic polyps are identified and left alone. Duodenum:  Normal bulbar and post bulbar mucosa. 5-6 mm submucosal lipoma noted at second part of duodenum.  Therapeutic/Diagnostic Maneuvers Performed:  None  Impression:  Multiple diverticula at sigmoid and a few at ascending colon. Two small polyps ablated via cold biopsy(cecum and splenic flexure). Two rectal polyps were snared. Larger one was 8 mm. Two gastric ulcers at antrum with scarring and deformity. Suspect chronic peptic ulcer disease.  Recommendations:  Discontinue Prilosec. Start pantoprazole 40 mg by mouth twice a day. Ferrous sulfate 325 mg by mouth twice a day. Will check H&H and H. pylori serology today.  John Sampson  08/18/2012 2:01 PM  CC: Dr. Tawny Hopping, PA & Dr. Bonnetta Sampson ref. provider found  Dr. Susa Griffins, MD

## 2012-08-18 NOTE — H&P (Signed)
John Sampson is an 76 y.o. male.   Chief Complaint: Patient is here for colonoscopy and possible EGD(if colonoscopy negative). HPI: Patient is 76 year old Caucasian male who was recently found to have iron deficiency anemia by his cardiologist Dr. Susa Griffins. He hasn't positive stools. He denies melena or rectal bleeding change in his bowel habits or abdominal pain. She also denies nausea vomiting chronic heartburn. He is on aspirin 162 mg daily he does not take NSAIDs. Patient has never undergone screening for CRC. Family history is negative for colorectal carcinoma.  Past Medical History  Diagnosis Date  . Shortness of breath   . COPD (chronic obstructive pulmonary disease)   . Hypertension   . GERD (gastroesophageal reflux disease)   . Headache   . Coronary artery disease     SEE DR Alanda Amass...  . Myocardial infarction     Past Surgical History  Procedure Date  . Coronary artery bypass graft     2003  X  5 GRAFTS  . Eye surgery     06/2011  . Tonsillectomy     AGE 68  . Craniotomy 12/31/2011    Procedure: CRANIOTOMY HYPOPHYSECTOMY TRANSNASAL APPROACH;  Surgeon: Mariam Dollar, MD;  Location: [facility];  Service: Neurosurgery;  Laterality: N/A;  Transphenoidal Hypophysectomy With Fat Graft Harvest from right abdomen Dr. Narda Bonds to assist     History reviewed. No pertinent family history. Social History:  reports that he quit smoking about 10 years ago. He does not have any smokeless tobacco history on file. He reports that he does not drink alcohol or use illicit drugs.  Allergies: No Known Allergies  Medications Prior to Admission  Medication Sig Dispense Refill  . albuterol-ipratropium (COMBIVENT) 18-103 MCG/ACT inhaler Inhale 2 puffs into the lungs daily.       [name] aspirin EC 81 MG tablet Take 162 mg by mouth daily.      [name] diltiazem (TIAZAC) 120 MG 24 hr capsule Take 120 mg by mouth daily.      . fish oil-omega-3 fatty acids 1000 MG capsule Take 2 g by mouth daily.       . hydrochlorothiazide (HYDRODIURIL) 25 MG tablet Take 25 mg by mouth daily.      [name] lisinopril (PRINIVIL,ZESTRIL) 20 MG tablet Take 30 mg by mouth daily.      . metoprolol succinate (TOPROL-XL) 50 MG 24 hr tablet Take 25 mg by mouth daily. Take with or immediately following a meal.      . Multiple Vitamin (MULTIVITAMIN) tablet Take 1 tablet by mouth daily.      [name] omeprazole (PRILOSEC) 20 MG capsule Take 20 mg by mouth daily.      . peg 3350 powder (MOVIPREP) 100 G SOLR Take 1 kit (100 g total) by mouth once.  1 kit  0  . potassium chloride (MICRO-K) 10 MEQ CR capsule Take 10 mEq by mouth daily.       . simvastatin (ZOCOR) 20 MG tablet Take 20 mg by mouth every evening.        No results found for this or any previous visit (from the past 48 hour(s)). No results found.  ROS  Blood pressure 170/68, pulse 71, temperature 97.6 F (36.4 C), temperature source Oral, SpO2 97.00%. Physical Exam  Constitutional: He appears well-developed and well-nourished.  HENT:  Mouth/Throat: Oropharynx is clear and moist. No oropharyngeal exudate.  Eyes: Conjunctivae normal are normal. No scleral icterus.  Neck: No thyromegaly present.  Cardiovascular: Normal rate, regular  rhythm and normal heart sounds.   No murmur heard. Respiratory: Effort normal and breath sounds normal.  GI: Soft. He exhibits no distension and no mass. There is no tenderness.       Small supraumbilical hernia with discoloration to skin. It is soft and completely reducible.  Musculoskeletal: He exhibits no edema.       Scott along the medial aspect her right leg  Lymphadenopathy:    He has no cervical adenopathy.  Neurological: He is alert.  Skin: Skin is warm and dry.     Assessment/Plan Iron deficiency anemia. Heme positive stool. Diagnostic colonoscopy followed by EGD if colonoscopy is normal.  John Sampson U 08/18/2012, 1:00 PM

## 2012-08-21 LAB — H. PYLORI ANTIBODY, IGG: H Pylori IgG: 0.42 {ISR}

## 2012-08-24 ENCOUNTER — Encounter (HOSPITAL_COMMUNITY): Payer: Self-pay | Admitting: Internal Medicine

## 2012-08-29 ENCOUNTER — Encounter (INDEPENDENT_AMBULATORY_CARE_PROVIDER_SITE_OTHER): Payer: Self-pay | Admitting: *Deleted

## 2012-09-18 ENCOUNTER — Encounter (INDEPENDENT_AMBULATORY_CARE_PROVIDER_SITE_OTHER): Payer: Self-pay

## 2012-10-05 DIAGNOSIS — H43819 Vitreous degeneration, unspecified eye: Secondary | ICD-10-CM | POA: Diagnosis not present

## 2012-10-05 DIAGNOSIS — H3560 Retinal hemorrhage, unspecified eye: Secondary | ICD-10-CM | POA: Diagnosis not present

## 2012-10-05 DIAGNOSIS — H348392 Tributary (branch) retinal vein occlusion, unspecified eye, stable: Secondary | ICD-10-CM | POA: Diagnosis not present

## 2012-10-05 DIAGNOSIS — H2510 Age-related nuclear cataract, unspecified eye: Secondary | ICD-10-CM | POA: Diagnosis not present

## 2012-12-11 ENCOUNTER — Encounter (HOSPITAL_COMMUNITY): Payer: Self-pay | Admitting: *Deleted

## 2012-12-11 ENCOUNTER — Emergency Department (HOSPITAL_COMMUNITY)
Admission: EM | Admit: 2012-12-11 | Discharge: 2012-12-11 | Disposition: A | Discharge status: Home / Self Care | Payer: Medicare Other | Attending: Emergency Medicine | Admitting: Emergency Medicine

## 2012-12-11 DIAGNOSIS — J449 Chronic obstructive pulmonary disease, unspecified: Secondary | ICD-10-CM | POA: Insufficient documentation

## 2012-12-11 DIAGNOSIS — I2510 Atherosclerotic heart disease of native coronary artery without angina pectoris: Secondary | ICD-10-CM | POA: Insufficient documentation

## 2012-12-11 DIAGNOSIS — Z23 Encounter for immunization: Secondary | ICD-10-CM | POA: Diagnosis not present

## 2012-12-11 DIAGNOSIS — J4489 Other specified chronic obstructive pulmonary disease: Secondary | ICD-10-CM | POA: Insufficient documentation

## 2012-12-11 DIAGNOSIS — Z87891 Personal history of nicotine dependence: Secondary | ICD-10-CM | POA: Diagnosis not present

## 2012-12-11 DIAGNOSIS — Z7982 Long term (current) use of aspirin: Secondary | ICD-10-CM | POA: Diagnosis not present

## 2012-12-11 DIAGNOSIS — I252 Old myocardial infarction: Secondary | ICD-10-CM | POA: Diagnosis not present

## 2012-12-11 DIAGNOSIS — Z8719 Personal history of other diseases of the digestive system: Secondary | ICD-10-CM | POA: Insufficient documentation

## 2012-12-11 DIAGNOSIS — Z4800 Encounter for change or removal of nonsurgical wound dressing: Secondary | ICD-10-CM | POA: Diagnosis not present

## 2012-12-11 DIAGNOSIS — Z79899 Other long term (current) drug therapy: Secondary | ICD-10-CM | POA: Insufficient documentation

## 2012-12-11 DIAGNOSIS — Z8709 Personal history of other diseases of the respiratory system: Secondary | ICD-10-CM | POA: Diagnosis not present

## 2012-12-11 DIAGNOSIS — I10 Essential (primary) hypertension: Secondary | ICD-10-CM | POA: Insufficient documentation

## 2012-12-11 DIAGNOSIS — T148XXA Other injury of unspecified body region, initial encounter: Secondary | ICD-10-CM

## 2012-12-11 DIAGNOSIS — L089 Local infection of the skin and subcutaneous tissue, unspecified: Secondary | ICD-10-CM | POA: Diagnosis not present

## 2012-12-11 MED ORDER — DOXYCYCLINE HYCLATE 100 MG PO CAPS: 100.0000 mg | ORAL_CAPSULE | Freq: Two times a day (BID) | ORAL | Status: DC

## 2012-12-11 MED ORDER — TETANUS-DIPHTH-ACELL PERTUSSIS 5-2.5-18.5 LF-MCG/0.5 IM SUSP
0.5000 mL | Freq: Once | INTRAMUSCULAR | Status: AC
  Administered 2012-12-11: 0.5 mL via INTRAMUSCULAR
  Filled 2012-12-11: qty 0.5

## 2012-12-11 NOTE — ED Provider Notes (Signed)
History  This chart was scribed for Joya Gaskins, MD by Ardeen Jourdain, ED Scribe. This patient was seen in room APA09/APA09 and the patient's care was started at 1025.  CSN: [id number]  Arrival date & time 12/11/12  1011   First MD Initiated Contact with Patient 12/11/12 1025      Chief Complaint  Patient presents with  . Wound Check     Patient is a 77 y.o. male presenting with wound check.  Wound Check  He was treated in the ED today. Previous treatment in the ED includes oral antibiotics. Treatments since wound repair include antibiotic ointment use and regular peroxide and water cleansings. His temperature was unmeasured prior to arrival. There has been no drainage from the wound. The redness has improved. The pain has improved. He has no difficulty moving the affected extremity or digit.    John Sampson is a 77 y.o. male who presents to the Emergency Department complaining of a possible wound infection. He states he hit his leg with a piece of wood Thursday, and the area "doesn't seem to be healing well."  He reports using neosporin and iodine for the wound with slight relief. He states the wound does not affect his ability to ambulate.    Past Medical History  Diagnosis Date  . Shortness of breath   . COPD (chronic obstructive pulmonary disease)   . Hypertension   . GERD (gastroesophageal reflux disease)   . Headache   . Coronary artery disease     SEE DR Alanda Amass...  . Myocardial infarction     Past Surgical History  Procedure Date  . Coronary artery bypass graft     2003  X  5 GRAFTS  . Eye surgery     06/2011  . Tonsillectomy     AGE 58  . Craniotomy 12/31/2011    Procedure: CRANIOTOMY HYPOPHYSECTOMY TRANSNASAL APPROACH;  Surgeon: Mariam Dollar, MD;  Location: [facility];  Service: Neurosurgery;  Laterality: N/A;  Transphenoidal Hypophysectomy With Fat Graft Harvest from right abdomen Dr. Narda Bonds to assist   . Colonoscopy 08/18/2012    Procedure:  COLONOSCOPY;  Surgeon: Malissa Hippo, MD;  Location: [facility];  Service: Endoscopy;  Laterality: N/A;  1:25  . Esophagogastroduodenoscopy 08/18/2012    Procedure: ESOPHAGOGASTRODUODENOSCOPY (EGD);  Surgeon: Malissa Hippo, MD;  Location: [facility];  Service: Endoscopy;  Laterality: N/A;    No family history on file.  History  Substance Use Topics  . Smoking status: Former Smoker -- 3.0 packs/day for 45 years    Quit date: 01/23/2002  . Smokeless tobacco: Not on file     Comment: Quit 11 yrs ago  . Alcohol Use: No      Review of Systems  Constitutional: Negative for fever and chills.  Respiratory: Negative for shortness of breath.   Cardiovascular: Negative for chest pain.  Gastrointestinal: Negative for nausea, vomiting and diarrhea.  Musculoskeletal: Negative for back pain.  Skin: Positive for wound.  Neurological: Negative for headaches.  All other systems reviewed and are negative.    Allergies  Review of patient's allergies indicates no known allergies.  Home Medications   Current Outpatient Rx  Name  Route  Sig  Dispense  Refill  . IPRATROPIUM-ALBUTEROL 18-103 MCG/ACT IN AERO   Inhalation   Inhale 2 puffs into the lungs daily.          . ASPIRIN EC 81 MG PO TBEC   Oral   Take 162  mg by mouth daily.         [name] DILTIAZEM HCL ER BEADS 120 MG PO CP24   Oral   Take 120 mg by mouth daily.         [name] DOXYCYCLINE HYCLATE 100 MG PO CAPS   Oral   Take 1 capsule (100 mg total) by mouth 2 (two) times daily.   10 capsule   0   . OMEGA-3 FATTY ACIDS 1000 MG PO CAPS   Oral   Take 2 g by mouth daily.         [name] HYDROCHLOROTHIAZIDE 25 MG PO TABS   Oral   Take 25 mg by mouth daily.         [name] LISINOPRIL 20 MG PO TABS   Oral   Take 30 mg by mouth daily.         [name] METOPROLOL SUCCINATE ER 50 MG PO TB24   Oral   Take 25 mg by mouth daily. Take with or immediately following a meal.         . ONE-DAILY MULTI VITAMINS PO TABS   Oral   Take 1  tablet by mouth daily.         [name] PANTOPRAZOLE SODIUM 40 MG PO TBEC   Oral   Take 1 tablet (40 mg total) by mouth daily.   60 tablet   5   . POTASSIUM CHLORIDE ER 10 MEQ PO CPCR   Oral   Take 10 mEq by mouth daily.          [name] SIMVASTATIN 20 MG PO TABS   Oral   Take 20 mg by mouth every evening.           Triage Vitals: BP 135/96  Pulse 77  Temp 98.2 F (36.8 C) (Oral)  Resp 16  Ht 5\' 9"  (1.753 m)  Wt 240 lb (108.863 kg)  BMI 35.44 kg/m2  SpO2 94%  Physical Exam  CONSTITUTIONAL: Well developed/well nourished HEAD AND FACE: Normocephalic/atraumatic EYES: EOMI/PERRL ENMT: Mucous membranes moist NECK: supple no meningeal signs CV: S1/S2 noted, no murmurs/rubs/gallops noted LUNGS: Lungs are clear to auscultation bilaterally, no apparent distress ABDOMEN: soft, nontender, no rebound or guarding NEURO: Pt is awake/alert, moves all extremitiesx4 EXTREMITIES: pulses normal, full ROM, small healing wound to left lower extremity, with slight overlying erythema SKIN: warm, color normal PSYCH: no abnormalities of mood noted   ED Course  Procedures   DIAGNOSTIC STUDIES: Oxygen Saturation is 94% on room air, adequate by my interpretation.    COORDINATION OF CARE:  10:38 AM: Discussed treatment plan which includes oral antibiotics with pt at bedside and pt agreed to plan.   Pt well appearing, could be early infection surrounding abrasion, doxycycline ordered for patient     1. Wound infection       MDM  Nursing notes including past medical history and social history reviewed and considered in documentation      I personally performed the services described in this documentation, which was scribed in my presence. The recorded information has been reviewed and is accurate.     Joya Gaskins, MD 12/11/12 [id number]

## 2012-12-11 NOTE — ED Notes (Signed)
Pt states he hit his leg with a piece of wood this past Thursday. States area doesn't seem to be healing well and thinks he may need an antibiotic. Slight redness around area. NAD.

## 2012-12-19 ENCOUNTER — Encounter (HOSPITAL_COMMUNITY): Payer: Self-pay | Admitting: *Deleted

## 2012-12-19 ENCOUNTER — Emergency Department (HOSPITAL_COMMUNITY)
Admission: EM | Admit: 2012-12-19 | Discharge: 2012-12-19 | Disposition: A | Discharge status: Home / Self Care | Payer: Medicare Other | Attending: Emergency Medicine | Admitting: Emergency Medicine

## 2012-12-19 DIAGNOSIS — I2510 Atherosclerotic heart disease of native coronary artery without angina pectoris: Secondary | ICD-10-CM | POA: Insufficient documentation

## 2012-12-19 DIAGNOSIS — J4489 Other specified chronic obstructive pulmonary disease: Secondary | ICD-10-CM | POA: Insufficient documentation

## 2012-12-19 DIAGNOSIS — L97909 Non-pressure chronic ulcer of unspecified part of unspecified lower leg with unspecified severity: Secondary | ICD-10-CM | POA: Diagnosis not present

## 2012-12-19 DIAGNOSIS — Z8679 Personal history of other diseases of the circulatory system: Secondary | ICD-10-CM | POA: Diagnosis not present

## 2012-12-19 DIAGNOSIS — Z7982 Long term (current) use of aspirin: Secondary | ICD-10-CM | POA: Diagnosis not present

## 2012-12-19 DIAGNOSIS — I10 Essential (primary) hypertension: Secondary | ICD-10-CM | POA: Diagnosis not present

## 2012-12-19 DIAGNOSIS — Z951 Presence of aortocoronary bypass graft: Secondary | ICD-10-CM | POA: Diagnosis not present

## 2012-12-19 DIAGNOSIS — L98499 Non-pressure chronic ulcer of skin of other sites with unspecified severity: Secondary | ICD-10-CM

## 2012-12-19 DIAGNOSIS — Z87891 Personal history of nicotine dependence: Secondary | ICD-10-CM | POA: Diagnosis not present

## 2012-12-19 DIAGNOSIS — J449 Chronic obstructive pulmonary disease, unspecified: Secondary | ICD-10-CM | POA: Insufficient documentation

## 2012-12-19 DIAGNOSIS — I252 Old myocardial infarction: Secondary | ICD-10-CM | POA: Diagnosis not present

## 2012-12-19 DIAGNOSIS — Z79899 Other long term (current) drug therapy: Secondary | ICD-10-CM | POA: Diagnosis not present

## 2012-12-19 DIAGNOSIS — T148XXA Other injury of unspecified body region, initial encounter: Secondary | ICD-10-CM | POA: Diagnosis not present

## 2012-12-19 DIAGNOSIS — K219 Gastro-esophageal reflux disease without esophagitis: Secondary | ICD-10-CM | POA: Insufficient documentation

## 2012-12-19 NOTE — ED Notes (Signed)
Pt c/o "infection" to left lower leg and right great toe, pt was seen in er on 12/12/2011 for the infection in his leg, given antibiotics, finished medication on Friday, states that the area was getting better but has become worse since Friday, pt has area to left lower leg size bigger than 50 cent piece with black center,redness noted to outer edges,  pt also has area to right great toe with black center and red edges. Did hit lower leg with piece of wood prior to 12/12/2011, denies any injury to toe,

## 2012-12-19 NOTE — ED Provider Notes (Signed)
History   This chart was scribed for Benny Lennert, MD by Gerlean Ren, ED Scribe. This patient was seen in room APA01/[facility]01 and the patient's care was started at 10:14 AM    CSN: [id number]  Arrival date & time 12/19/12  0915   First MD Initiated Contact with Patient 12/19/12 [phone]      Chief Complaint  Patient presents with  . Wound Infection     The history is provided by the patient. No language interpreter was used.   John Sampson is a 77 y.o. male with h/o COPD, HTN, CAD, MI who presents to the Emergency Department complaining of a wound infection over his left lateral ankle that began 2 weeks ago when he hit the area with a piece of wood accidentally.  Pt denies any current associated pain.  Pt seen here 01/13 for same and completed antibiotic treatment 01/17 that had improved infection but states it has since gradually worsened again.  Pt denies any further injuries or traumas to the area since initial injury.  Pt is a former smoker and denies alcohol use. PCP is Dr. Kae Heller. Past Medical History  Diagnosis Date  . Shortness of breath   . COPD (chronic obstructive pulmonary disease)   . Hypertension   . GERD (gastroesophageal reflux disease)   . Headache   . Coronary artery disease     SEE DR Alanda Amass...  . Myocardial infarction     Past Surgical History  Procedure Date  . Coronary artery bypass graft     2003  X  5 GRAFTS  . Eye surgery     06/2011  . Tonsillectomy     AGE 88  . Craniotomy 12/31/2011    Procedure: CRANIOTOMY HYPOPHYSECTOMY TRANSNASAL APPROACH;  Surgeon: Mariam Dollar, MD;  Location: [facility];  Service: Neurosurgery;  Laterality: N/A;  Transphenoidal Hypophysectomy With Fat Graft Harvest from right abdomen Dr. Narda Bonds to assist   . Colonoscopy 08/18/2012    Procedure: COLONOSCOPY;  Surgeon: Malissa Hippo, MD;  Location: [facility];  Service: Endoscopy;  Laterality: N/A;  1:25  . Esophagogastroduodenoscopy 08/18/2012    Procedure:  ESOPHAGOGASTRODUODENOSCOPY (EGD);  Surgeon: Malissa Hippo, MD;  Location: [facility];  Service: Endoscopy;  Laterality: N/A;    No family history on file.  History  Substance Use Topics  . Smoking status: Former Smoker -- 3.0 packs/day for 45 years    Quit date: 01/23/2002  . Smokeless tobacco: Not on file     Comment: Quit 11 yrs ago  . Alcohol Use: No      Review of Systems  Skin: Positive for wound (infection).    Allergies  Review of patient's allergies indicates no known allergies.  Home Medications   Current Outpatient Rx  Name  Route  Sig  Dispense  Refill  . IPRATROPIUM-ALBUTEROL 18-103 MCG/ACT IN AERO   Inhalation   Inhale 2 puffs into the lungs daily.          . ASPIRIN EC 81 MG PO TBEC   Oral   Take 162 mg by mouth daily.         [name] DILTIAZEM HCL ER BEADS 120 MG PO CP24   Oral   Take 120 mg by mouth daily.         . OMEGA-3 FATTY ACIDS 1000 MG PO CAPS   Oral   Take 2 g by mouth daily.         [name] HYDROCHLOROTHIAZIDE 25 MG  PO TABS   Oral   Take 25 mg by mouth daily.         [name] LISINOPRIL 40 MG PO TABS   Oral   Take 20 mg by mouth daily.         [name] METOPROLOL SUCCINATE ER 50 MG PO TB24   Oral   Take 25 mg by mouth daily. Take with or immediately following a meal.         . ONE-DAILY MULTI VITAMINS PO TABS   Oral   Take 1 tablet by mouth daily.         [name] OMEPRAZOLE 20 MG PO CPDR   Oral   Take 20 mg by mouth daily.         [name] POTASSIUM CHLORIDE ER 10 MEQ PO CPCR   Oral   Take 10 mEq by mouth 2 (two) times daily.          [name] SIMVASTATIN 10 MG PO TABS   Oral   Take 10 mg by mouth at bedtime.         [name] DOXYCYCLINE HYCLATE 100 MG PO CAPS   Oral   Take 1 capsule (100 mg total) by mouth 2 (two) times daily.   10 capsule   0     BP 180/64  Pulse 85  Temp 97.6 F (36.4 C)  Resp 24  SpO2 95%  Physical Exam  Nursing note and vitals reviewed. Constitutional: He is oriented to person, place, and time. He appears  well-developed.  HENT:  Head: Normocephalic.  Eyes: Conjunctivae normal are normal.  Neck: No tracheal deviation present.  Cardiovascular:  No murmur heard. Musculoskeletal: Normal range of motion.  Neurological: He is oriented to person, place, and time.  Skin: Skin is warm.       Ulcer 2cm in diameter over left lateral ankle.  Psychiatric: He has a normal mood and affect.    ED Course  Procedures (including critical care time) DIAGNOSTIC STUDIES: Oxygen Saturation is 95% on room air, adequate by my interpretation.    COORDINATION OF CARE: 10:16 AM- Patient informed of clinical course, understands medical decision-making process, and agrees with plan.     No diagnosis found.    MDM    The chart was scribed for me under my direct supervision.  I personally performed the history, physical, and medical decision making and all procedures in the evaluation of this patient.Benny Lennert, MD 12/19/12 1055

## 2012-12-21 ENCOUNTER — Ambulatory Visit (HOSPITAL_COMMUNITY)
Admission: RE | Admit: 2012-12-21 | Discharge: 2012-12-21 | Disposition: A | Discharge status: Home / Self Care | Payer: Medicare Other | Source: Ambulatory Visit | Attending: Family Medicine | Admitting: Family Medicine

## 2012-12-21 DIAGNOSIS — S91109A Unspecified open wound of unspecified toe(s) without damage to nail, initial encounter: Secondary | ICD-10-CM | POA: Insufficient documentation

## 2012-12-21 DIAGNOSIS — IMO0001 Reserved for inherently not codable concepts without codable children: Secondary | ICD-10-CM | POA: Insufficient documentation

## 2012-12-21 DIAGNOSIS — I10 Essential (primary) hypertension: Secondary | ICD-10-CM | POA: Diagnosis not present

## 2012-12-21 NOTE — Progress Notes (Signed)
Physical Therapy - Wound Therapy  Evaluation   Patient Details  Name: John Sampson MRN: [id number] Date of Birth: June 29, 1936  Today's Date: 12/21/2012 Time: 1520-1605 Time Calculation (min): 45 min  Visit#: 1  of 9   Re-eval: 01/11/13  Subjective Subjective Assessment Subjective: John Sampson states he is not quite sure what started his wound but he thinks it may have started when he was jabbed by some wood approximately two weeks ago.  He states the wound started out smaller than a dime and has progressed in natue.  His daughter is here with him and states that the wound looks worse than it did yesterday.  The patient was prescribed antibiotics when he has taken.  He is now being referred to therapy for wound care.  The patient states a different wound has started on his Right great toe about a week ago as well.  The pt denies being a diabetic and has had recent blood labs taken in December. Patient and Family Stated Goals: For his wounds to heal. Date of Onset: 12/07/12 Prior Treatments: cleansing/ antibiotics  Pain Assessment Pain Assessment Pain Assessment: No/denies pain  Wound Therapy Wound 12/21/12 Contact dermatitis;Puncture Leg Left;Lateral (Active)  Site / Wound Assessment Black 12/21/2012  4:20 PM  % Wound base Red or Granulating 0% 12/21/2012  4:20 PM  % Wound base Black 100% 12/21/2012  4:20 PM  Peri-wound Assessment Edema;Erythema (blanchable) 12/21/2012  4:20 PM  Wound Length (cm) 2.4 cm 12/21/2012  4:20 PM  Wound Width (cm) 2.2 cm 12/21/2012  4:20 PM  Closure None 12/21/2012  4:20 PM  Drainage Amount None 12/21/2012  4:20 PM  Treatment Hydrotherapy (Pulse lavage);Debridement (Selective) 12/21/2012  4:20 PM  Dressing Type Gauze (Comment);Honey 12/21/2012  4:20 PM  Dressing Changed New 12/21/2012  4:20 PM  Dressing Status Clean 12/21/2012  4:20 PM     Wound 12/21/12 Puncture Toe (Comment  which one) Right great toe  (Active)  Site / Wound Assessment Black 12/21/2012  4:20 PM  %  Wound base Red or Granulating 0% 12/21/2012  4:20 PM  % Wound base Yellow 0% 12/21/2012  4:20 PM  % Wound base Black 100% 12/21/2012  4:20 PM  Peri-wound Assessment Edema;Erythema (blanchable) 12/21/2012  4:20 PM  Wound Length (cm) 0.8 cm 12/21/2012  4:20 PM  Wound Width (cm) 1.2 cm 12/21/2012  4:20 PM  Margins Attached edges (approximated) 12/21/2012  4:20 PM  Closure None 12/21/2012  4:20 PM  Drainage Amount None 12/21/2012  4:20 PM  Treatment Cleansed;Debridement (Selective) 12/21/2012  4:20 PM  Dressing Type Gauze (Comment);Honey 12/21/2012  4:20 PM  Dressing Changed New 12/21/2012  4:20 PM  Dressing Status Clean 12/21/2012  4:20 PM     Incision 12/31/11 Face Other (Comment) (Active)     Incision 12/31/11 Abdomen Other (Comment) (Active)   Hydrotherapy Pulsed lavage therapy - wound location: entire wound bed Pulsed Lavage with Suction (psi): 8 psi Pulsed Lavage with Suction - Normal Saline Used: 1000 mL Pulsed Lavage Tip: Tip with splash shield Selective Debridement Selective Debridement - Location: entire wound bed- very adhesive for leg;  toe is also very Marva Panda Selective Debridement - Tools Used: Forceps;Scalpel;Scissors Selective Debridement - Tissue Removed: eschar   Physical Therapy Assessment and Plan Wound Therapy - Assess/Plan/Recommendations Wound Therapy - Clinical Statement: Pt with nonhealing wounds.  Pt has had a course of antibiotics but the wound on his left leg continues to progress in severity.  Recommend pulse lavage and debridement to provide a healing environment.  Hydrotherapy Plan: Debridement;Dressing change;Patient/family education;Pulsatile lavage with suction Wound Therapy - Frequency: 3X / week Wound Therapy - Current Recommendations: PT Wound Plan: Pulse lavage, debridement and dressing change.      Goals Wound Therapy Goals - Improve the function of patient's integumentary system by progressing the wound(s) through the phases of wound healing by: Decrease  Necrotic Tissue to: 0 Increase Granulation Tissue to: 100 Increase Granulation Tissue - Progress: Goal set today Decrease Length/Width/Depth by (cm): 1.5x1.5 Decrease Length/Width/Depth - Progress: Goal set today Improve Drainage Characteristics:  (none) Improve Drainage Characteristics - Progress: Goal set today Patient/Family will be able to : educated in care and dressing changes  Patient/Family Instruction Goal - Progress: Goal set today Time For Goal Achievement:  (3 weeks) Wound Therapy - Potential for Goals: Good  Problem List Patient Active Problem List  Diagnosis  . Anemia  . Hypertension  . High cholesterol  . MI, old  . Blood in stool    GP Functional Assessment Tool Used: clinical judgement Functional Limitation: Other PT primary Other PT Primary Current Status (B1478): At least 20 percent but less than 40 percent impaired, limited or restricted Other PT Primary Goal Status (G9562): At least 1 percent but less than 20 percent impaired, limited or restricted  John Sampson,CINDY 12/21/2012, 4:32 PM

## 2012-12-22 ENCOUNTER — Ambulatory Visit (HOSPITAL_COMMUNITY)
Admission: RE | Admit: 2012-12-22 | Discharge: 2012-12-22 | Disposition: A | Discharge status: Home / Self Care | Payer: Medicare Other | Source: Ambulatory Visit | Attending: Family Medicine | Admitting: Family Medicine

## 2012-12-22 DIAGNOSIS — I10 Essential (primary) hypertension: Secondary | ICD-10-CM | POA: Diagnosis not present

## 2012-12-22 DIAGNOSIS — S91109A Unspecified open wound of unspecified toe(s) without damage to nail, initial encounter: Secondary | ICD-10-CM | POA: Diagnosis not present

## 2012-12-22 DIAGNOSIS — IMO0001 Reserved for inherently not codable concepts without codable children: Secondary | ICD-10-CM | POA: Diagnosis not present

## 2012-12-22 NOTE — Progress Notes (Signed)
Physical Therapy - Wound Therapy  Treatment   Patient Details  Name: John Sampson MRN: [id number] Date of Birth: [birth year]  Today's Date: 12/22/2012 Time: 1020-1055 Time Calculation (min): 35 min  Visit#: 2  of 9   Re-eval: 01/11/13 Charges: Selective debridement (= or < 20 cm)   Subjective Subjective Assessment Subjective: Pt is pain free at entry.   Pain Assessment Pain Assessment Pain Assessment: No/denies pain  Wound Therapy Wound 12/21/12 Contact dermatitis;Puncture Leg Left;Lateral (Active)  Site / Wound Assessment Black 12/22/2012  3:12 PM  % Wound base Red or Granulating 25% 12/22/2012  3:12 PM  % Wound base Black 75% 12/22/2012  3:12 PM  Peri-wound Assessment Edema;Erythema (blanchable) 12/22/2012  3:12 PM  Wound Length (cm) 2.4 cm 12/21/2012  4:20 PM  Wound Width (cm) 2.2 cm 12/21/2012  4:20 PM  Closure None 12/22/2012  3:12 PM  Drainage Amount Minimal 12/22/2012  3:12 PM  Treatment Cleansed;Debridement (Selective);Hydrotherapy (Pulse lavage) 12/22/2012  3:12 PM  Dressing Type Gauze (Comment);Honey 12/22/2012  3:12 PM  Dressing Changed Changed 12/22/2012  3:12 PM  Dressing Status Clean;Dry;Intact 12/22/2012  3:12 PM     Wound 12/21/12 Puncture Toe (Comment  which one) Right great toe  (Active)  Site / Wound Assessment Black 12/22/2012  3:12 PM  % Wound base Red or Granulating 0% 12/22/2012  3:12 PM  % Wound base Yellow 0% 12/22/2012  3:12 PM  % Wound base Black 100% 12/22/2012  3:12 PM  Peri-wound Assessment Edema;Erythema (blanchable) 12/22/2012  3:12 PM  Wound Length (cm) 0.8 cm 12/21/2012  4:20 PM  Wound Width (cm) 1.2 cm 12/21/2012  4:20 PM  Margins Attached edges (approximated) 12/22/2012  3:12 PM  Closure None 12/22/2012  3:12 PM  Drainage Amount None 12/22/2012  3:12 PM  Treatment Cleansed;Debridement (Selective) 12/22/2012  3:12 PM  Dressing Type Gauze (Comment);Honey 12/22/2012  3:12 PM  Dressing Changed Changed 12/22/2012  3:12 PM  Dressing Status Clean 12/22/2012  3:12  PM     Incision 12/31/11 Face Other (Comment) (Active)     Incision 12/31/11 Abdomen Other (Comment) (Active)   Hydrotherapy Pulsed lavage therapy - wound location: entire wound bed Pulsed Lavage with Suction (psi): 4 psi Pulsed Lavage with Suction - Normal Saline Used: 1000 mL Pulsed Lavage Tip: Tip with splash shield Selective Debridement Selective Debridement - Location: Entire wound RLE wound; Black eschar scored with scalpel Selective Debridement - Tools Used: Forceps;Scalpel;Scissors Selective Debridement - Tissue Removed: eschar and slough   Physical Therapy Assessment and Plan Wound Therapy - Assess/Plan/Recommendations Wound Therapy - Clinical Statement: Pt presents with improved granulation in R LE wound. L great toe wound continues to be covered with black eschar. Pt tolerates debridement well. Wound Plan: Continue wound care per PT POC.  Problem List Patient Active Problem List  Diagnosis  . Anemia  . Hypertension  . High cholesterol  . MI, old  . Blood in stool    GP Functional Assessment Tool Used: clinical judgement  Seth Bake, PTA 12/22/2012, 3:30 PM

## 2012-12-25 ENCOUNTER — Ambulatory Visit (HOSPITAL_COMMUNITY)
Admission: RE | Admit: 2012-12-25 | Discharge: 2012-12-25 | Disposition: A | Discharge status: Home / Self Care | Payer: Medicare Other | Source: Ambulatory Visit | Attending: Family Medicine | Admitting: Family Medicine

## 2012-12-25 DIAGNOSIS — I10 Essential (primary) hypertension: Secondary | ICD-10-CM | POA: Diagnosis not present

## 2012-12-25 DIAGNOSIS — S91109A Unspecified open wound of unspecified toe(s) without damage to nail, initial encounter: Secondary | ICD-10-CM | POA: Diagnosis not present

## 2012-12-25 DIAGNOSIS — IMO0001 Reserved for inherently not codable concepts without codable children: Secondary | ICD-10-CM | POA: Diagnosis not present

## 2012-12-25 NOTE — Progress Notes (Signed)
Physical Therapy - Wound Therapy  Treatment   Patient Details  Name: John Sampson MRN: [id number] Date of Birth: 11/01/1936  Today's Date: 12/25/2012 Time: 7829-5621 Time Calculation (min): 43 min  Visit#: 3  of 9   Re-eval: 01/11/13 Charges: Selective debridement (= or < 20 cm)   Subjective Subjective Assessment Subjective: Pt states that he had no pain over the weekend but has some pain in L LE wound today.  Pain Assessment Pain Assessment Pain Score:   5 Pain Location: Leg Pain Orientation: Left;Anterior;Lower  Wound Therapy Wound 12/21/12 Contact dermatitis;Puncture Leg Left;Lateral (Active)  Site / Wound Assessment Black 12/25/2012  9:30 AM  % Wound base Red or Granulating 45% 12/25/2012  9:30 AM  % Wound base Black 55% 12/25/2012  9:30 AM  Peri-wound Assessment Edema;Erythema (blanchable) 12/25/2012  9:30 AM  Wound Length (cm) 2.4 cm 12/21/2012  4:20 PM  Wound Width (cm) 2.2 cm 12/21/2012  4:20 PM  Closure None 12/25/2012  9:30 AM  Drainage Amount Minimal 12/25/2012  9:30 AM  Treatment Cleansed;Debridement (Selective);Hydrotherapy (Pulse lavage) 12/25/2012  9:30 AM  Dressing Type Gauze (Comment);Honey 12/25/2012  9:30 AM  Dressing Changed Changed 12/25/2012  9:30 AM  Dressing Status Clean;Dry;Intact 12/25/2012  9:30 AM     Wound 12/21/12 Puncture Toe (Comment  which one) Right great toe  (Active)  Site / Wound Assessment Black 12/25/2012  9:30 AM  % Wound base Red or Granulating 5% 12/25/2012  9:30 AM  % Wound base Yellow 80% 12/25/2012  9:30 AM  % Wound base Black 15% 12/25/2012  9:30 AM  Peri-wound Assessment Edema;Erythema (blanchable) 12/25/2012  9:30 AM  Wound Length (cm) 0.8 cm 12/21/2012  4:20 PM  Wound Width (cm) 1.2 cm 12/21/2012  4:20 PM  Margins Attached edges (approximated) 12/25/2012  9:30 AM  Closure None 12/25/2012  9:30 AM  Drainage Amount None 12/25/2012  9:30 AM  Treatment Cleansed;Debridement (Selective) 12/25/2012  9:30 AM  Dressing Type Gauze (Comment);Honey  12/25/2012  9:30 AM  Dressing Changed Changed 12/25/2012  9:30 AM  Dressing Status Clean 12/25/2012  9:30 AM     Incision 12/31/11 Face Other (Comment) (Active)     Incision 12/31/11 Abdomen Other (Comment) (Active)   Hydrotherapy Pulsed lavage therapy - wound location: entire wound bed Pulsed Lavage with Suction (psi): 4 psi Pulsed Lavage with Suction - Normal Saline Used: 1000 mL Pulsed Lavage Tip: Tip with splash shield Selective Debridement Selective Debridement - Location: LLE and R great toe wounds Selective Debridement - Tools Used: Forceps;Scalpel;Scissors Selective Debridement - Tissue Removed: eschar and slough   Physical Therapy Assessment and Plan Wound Therapy - Assess/Plan/Recommendations Wound Therapy - Clinical Statement: Both wounds presents with increased granulation. Some redness and heat noted around LLE wound. Pt denies any nausea or fever. Will watch. LLE  wound is dry this session. Change dressing to hydrocolloid to increase moisture and facilitate proper wound healing environment. Pt reports pain decrease to 3/10 at end of session. Wound Plan: Continue wound care per PT POC.  Problem List Patient Active Problem List  Diagnosis  . Anemia  . Hypertension  . High cholesterol  . MI, old  . Blood in stool    GP Functional Assessment Tool Used: clinical judgement  Seth Bake, PTA 12/25/2012, 9:38 AM

## 2012-12-27 ENCOUNTER — Ambulatory Visit (HOSPITAL_COMMUNITY)
Admission: RE | Admit: 2012-12-27 | Discharge: 2012-12-27 | Disposition: A | Discharge status: Home / Self Care | Payer: Medicare Other | Source: Ambulatory Visit | Attending: Family Medicine | Admitting: Family Medicine

## 2012-12-27 DIAGNOSIS — I10 Essential (primary) hypertension: Secondary | ICD-10-CM | POA: Diagnosis not present

## 2012-12-27 DIAGNOSIS — S91109A Unspecified open wound of unspecified toe(s) without damage to nail, initial encounter: Secondary | ICD-10-CM | POA: Diagnosis not present

## 2012-12-27 DIAGNOSIS — IMO0001 Reserved for inherently not codable concepts without codable children: Secondary | ICD-10-CM | POA: Diagnosis not present

## 2012-12-27 NOTE — Progress Notes (Signed)
Physical Therapy - Wound Therapy  Treatment   Patient Details  Name: John Sampson MRN: [id number] Date of Birth: 08-23-1936  Today's Date: 12/27/2012 Time: 0920-1010 Time Calculation (min): 50 min  Visit#: 4  of 9   Re-eval: 01/11/13 Charge: selective debridement </= 20 cm Subjective Subjective Assessment Subjective: Pt stated no current pain but it does come and go.  Pain Assessment Pain Assessment Pain Assessment: No/denies pain  Wound Therapy  12/27/12 1300  Subjective Assessment  Subjective Pt stated no current pain but it does come and go.  Wound 12/21/12 Contact dermatitis;Puncture Leg Left;Lateral  Date First Assessed/Time First Assessed: 12/21/12 1525   Wound Type: Contact dermatitis;Puncture  Location: Leg  Location Orientation: Left;Lateral  Site / Wound Assessment Red;Yellow;Black;Pink  % Wound base Red or Granulating 85%  % Wound base Yellow 10%  % Wound base Black 5%  Peri-wound Assessment Edema;Erythema (blanchable)  Closure None  Drainage Amount Scant  Dressing Type Gauze (Comment);Honey  Dressing Status Clean;Dry;Intact  Wound 12/21/12 Puncture Toe (Comment  which one) Right great toe   Date First Assessed/Time First Assessed: 12/21/12 1550   Wound Type: Puncture  Location: Toe (Comment  which one)  Location Orientation: Right  Wound Description (Comments): great toe   Present on Admission: Yes  Site / Wound Assessment Yellow;Black  % Wound base Red or Granulating 10%  % Wound base Yellow 75%  % Wound base Black 15%  Peri-wound Assessment Edema;Erythema (blanchable)  Closure None  Drainage Amount None  Dressing Type Gauze (Comment);Honey  Dressing Status Clean;Dry;Intact  Hydrotherapy  Pulsed lavage therapy - wound location entire wound bed L LE  Pulsed Lavage with Suction (psi) 4 psi  Pulsed Lavage with Suction - Normal Saline Used 1000 mL  Pulsed Lavage Tip Tip with splash shield  Selective Debridement  Selective Debridement - Location LLE and R  great toe wounds  Selective Debridement - Tools Used Forceps;Scalpel  Selective Debridement - Tissue Removed eschar and slough  Wound Therapy - Assess/Plan/Recommendations  Wound Therapy - Clinical Statement Pt tolerated well towards debridement, able to remove black eschar from 3 o'clock to 10o'clock and slough.  PLS no longer needed, debridement only for L LE and R toe.  Increased granulation tissue following debridement.  Continued with hydrocolloid honey dressings due to positive results from prior treatments and gauze dressings.  Wound Plan Continue wound care selective debridement per PT POC.     Hydrotherapy Pulsed lavage therapy - wound location: entire wound bed L LE Pulsed Lavage with Suction (psi): 4 psi Pulsed Lavage with Suction - Normal Saline Used: 1000 mL Pulsed Lavage Tip: Tip with splash shield Selective Debridement Selective Debridement - Location: LLE and R great toe wounds Selective Debridement - Tools Used: Forceps;Scalpel Selective Debridement - Tissue Removed: eschar and slough   Physical Therapy Assessment and Plan Wound Therapy - Assess/Plan/Recommendations Wound Therapy - Clinical Statement: Pt tolerated well towards debridement, able to remove black eschar from 3 o'clock to 10o'clock and slough.  PLS no longer needed, debridement only for L LE and R toe.  Increased granulation tissue following debridement.  Continued with hydrocolloid honey dressings due to positive results from prior treatments and gauze dressings. Wound Plan: Continue wound care selective debridement per PT POC.      Goals    Problem List Patient Active Problem List  Diagnosis  . Anemia  . Hypertension  . High cholesterol  . MI, old  . Blood in stool    GP    Becky Sax  Alvino Chapel 12/27/2012, 12:58 PM

## 2012-12-29 ENCOUNTER — Ambulatory Visit (HOSPITAL_COMMUNITY)
Admission: RE | Admit: 2012-12-29 | Discharge: 2012-12-29 | Disposition: A | Discharge status: Home / Self Care | Payer: Medicare Other | Source: Ambulatory Visit | Attending: Family Medicine | Admitting: Family Medicine

## 2012-12-29 DIAGNOSIS — IMO0001 Reserved for inherently not codable concepts without codable children: Secondary | ICD-10-CM | POA: Diagnosis not present

## 2012-12-29 DIAGNOSIS — S91109A Unspecified open wound of unspecified toe(s) without damage to nail, initial encounter: Secondary | ICD-10-CM | POA: Diagnosis not present

## 2012-12-29 DIAGNOSIS — I10 Essential (primary) hypertension: Secondary | ICD-10-CM | POA: Diagnosis not present

## 2012-12-29 NOTE — Progress Notes (Signed)
Physical Therapy - Wound Therapy  Treatment   Patient Details  Name: John Sampson MRN: [id number] Date of Birth: 1936/05/02  Today's Date: 12/29/2012 Time: 0865-7846 Time Calculation (min): 42 min  Visit#: 5  of 9   Re-eval: 01/11/13 Charge:  Selective debridement </= 20 cm  Subjective Subjective Assessment Subjective: Intermittent pain, was sore following last session.  No pain currently.  Pain Assessment Pain Assessment Pain Assessment: No/denies pain  Wound Therapy   12/29/12 0928  Subjective Assessment  Subjective Intermittent pain, was sore following last session.  No pain currently.  Wound 12/21/12 Contact dermatitis;Puncture Leg Left;Lateral  Date First Assessed/Time First Assessed: 12/21/12 1525   Wound Type: Contact dermatitis;Puncture  Location: Leg  Location Orientation: Left;Lateral  Site / Wound Assessment Pink;Red;Yellow;Brown;Granulation tissue (brown on proximal edge of wound)  % Wound base Red or Granulating 85%  % Wound base Yellow 15%  % Wound base Black 0%  Peri-wound Assessment Edema;Erythema (blanchable)  Closure None  Drainage Amount Scant  Treatment Cleansed;Debridement (Selective)  Dressing Type Gauze (Comment);Honey  Dressing Changed New  Dressing Status Clean;Dry;Intact  Wound 12/21/12 Puncture Toe (Comment  which one) Right great toe   Date First Assessed/Time First Assessed: 12/21/12 1550   Wound Type: Puncture  Location: Toe (Comment  which one)  Location Orientation: Right  Wound Description (Comments): great toe   Present on Admission: Yes  Site / Wound Assessment Yellow;Black  % Wound base Red or Granulating 5%  % Wound base Yellow 80%  % Wound base Black 15%  Peri-wound Assessment Edema;Erythema (blanchable)  Closure None  Drainage Amount None  Treatment Cleansed;Debridement (Selective)  Dressing Type Gauze (Comment);Honey  Dressing Changed New  Dressing Status Clean;Dry;Intact  Selective Debridement  Selective Debridement -  Location LLE and R great toe wounds  Selective Debridement - Tools Used Forceps;Scalpel  Selective Debridement - Tissue Removed eschar and slough  Wound Therapy - Assess/Plan/Recommendations  Wound Therapy - Clinical Statement Black eschar removed completely from wound on LLE with honey upon dressing removal initially this session.  Noted brown edge on proximal border of wound.  Pt tolerated wound debridement well with no c/o pain during session.  Increased circulation to LLE wound with visible granulation tissue under slough.  Following discussion with evaluation PT, decision made to reduce frequency to twice a week.    Wound Plan Continue wound care selective debridement 2x a week per PT POC.     Selective Debridement Selective Debridement - Location: LLE and R great toe wounds Selective Debridement - Tools Used: Forceps;Scalpel Selective Debridement - Tissue Removed: eschar and slough   Physical Therapy Assessment and Plan Wound Therapy - Assess/Plan/Recommendations Wound Therapy - Clinical Statement: Black eschar removed completely from wound on LLE with honey upon dressing removal initially this session.  Noted brown edge on proximal border of wound.  Pt tolerated wound debridement well with no c/o pain during session.  Increased circulation to LLE wound with visible granulation tissue under slough.  Following discussion with evaluation PT, decision made to reduce frequency to twice a week.   Wound Plan: Continue wound care selective debridement 2x a week per PT POC.      Goals    Problem List Patient Active Problem List  Diagnosis  . Anemia  . Hypertension  . High cholesterol  . MI, old  . Blood in stool    GP    John Sampson 12/29/2012, 11:51 AM

## 2013-01-01 ENCOUNTER — Ambulatory Visit (HOSPITAL_COMMUNITY)
Admission: RE | Admit: 2013-01-01 | Discharge: 2013-01-01 | Disposition: A | Discharge status: Home / Self Care | Payer: Medicare Other | Source: Ambulatory Visit | Attending: Family Medicine | Admitting: Family Medicine

## 2013-01-01 DIAGNOSIS — S91109A Unspecified open wound of unspecified toe(s) without damage to nail, initial encounter: Secondary | ICD-10-CM | POA: Insufficient documentation

## 2013-01-01 DIAGNOSIS — IMO0001 Reserved for inherently not codable concepts without codable children: Secondary | ICD-10-CM | POA: Insufficient documentation

## 2013-01-01 DIAGNOSIS — I10 Essential (primary) hypertension: Secondary | ICD-10-CM | POA: Diagnosis not present

## 2013-01-01 NOTE — Progress Notes (Signed)
Physical Therapy - Wound Therapy  Treatment    Patient Details  Name: Bruk Tumolo MRN: [id number] Date of Birth: [birth year]  Today's Date: 01/01/2013 Time: 5284-1324 Time Calculation (min): 40 min  Visit#: 6  of 9   Re-eval: 01/11/13 Charges: Selective debridement (= or < 20 cm)   Subjective Subjective Assessment Subjective: Pt states that he has pain in his leg at night but not curing the day. Pt states that he had been putting alcohol on his foot to decrease pain. (Pt advised not to do this as it could dry his skin).  Pain Assessment Pain Assessment Pain Assessment: No/denies pain  Wound Therapy Wound 12/21/12 Contact dermatitis;Puncture Leg Left;Lateral (Active)  Site / Wound Assessment Pink;Red;Yellow;Brown;Granulation tissue 01/01/2013  1:01 PM  % Wound base Red or Granulating 50% 01/01/2013  1:01 PM  % Wound base Yellow 50% 01/01/2013  1:01 PM  % Wound base Black 0% 01/01/2013  1:01 PM  Peri-wound Assessment Edema;Erythema (blanchable) 01/01/2013  1:01 PM  Wound Length (cm) 2.4 cm 12/21/2012  4:20 PM  Wound Width (cm) 2.2 cm 12/21/2012  4:20 PM  Closure None 01/01/2013  1:01 PM  Drainage Amount Scant 01/01/2013  1:01 PM  Non-staged Wound Description Full thickness 01/01/2013  1:01 PM  Treatment Cleansed;Debridement (Selective) 01/01/2013  1:01 PM  Dressing Type Gauze (Comment);Honey 01/01/2013  1:01 PM  Dressing Changed Changed 01/01/2013  1:01 PM  Dressing Status Clean;Dry;Intact 01/01/2013  1:01 PM     Wound 12/21/12 Puncture Toe (Comment  which one) Right great toe  (Active)  Site / Wound Assessment Yellow;Black 01/01/2013  1:01 PM  % Wound base Red or Granulating 5% 01/01/2013  1:01 PM  % Wound base Yellow 85% 01/01/2013  1:01 PM  % Wound base Black 10% 01/01/2013  1:01 PM  Peri-wound Assessment Edema;Erythema (blanchable) 01/01/2013  1:01 PM  Wound Length (cm) 0.8 cm 12/21/2012  4:20 PM  Wound Width (cm) 1.2 cm 12/21/2012  4:20 PM  Margins Attached edges (approximated) 12/25/2012  9:30 AM   Closure None 01/01/2013  1:01 PM  Drainage Amount None 01/01/2013  1:01 PM  Treatment Cleansed;Debridement (Selective) 01/01/2013  1:01 PM  Dressing Type Gauze (Comment);Honey 01/01/2013  1:01 PM  Dressing Changed Changed 01/01/2013  1:01 PM  Dressing Status Clean;Dry;Intact 01/01/2013  1:01 PM     Incision 12/31/11 Face Other (Comment) (Active)     Incision 12/31/11 Abdomen Other (Comment) (Active)   Selective Debridement Selective Debridement - Location: LLE and R great toe wounds Selective Debridement - Tools Used: Forceps;Scalpel Selective Debridement - Tissue Removed: eschar and slough   Physical Therapy Assessment and Plan Wound Therapy - Assess/Plan/Recommendations Wound Therapy - Clinical Statement: Wound is dry upon removal of dressing as dressing slid down leg over the weekend. Able to remove 50% of dried slough but unable to remove all of it secondary to pain and sensitivity to debridement. R toe wound continues to be mostly covered in slough. Wound Plan: Continue wound care selective debridement 2x a week per PT POC.       Problem List Patient Active Problem List  Diagnosis  . Anemia  . Hypertension  . High cholesterol  . MI, old  . Blood in stool   Seth Bake, PTA 01/01/2013, 1:10 PM

## 2013-01-03 ENCOUNTER — Ambulatory Visit (HOSPITAL_COMMUNITY): Payer: Medicare Other | Admitting: *Deleted

## 2013-01-04 ENCOUNTER — Ambulatory Visit (HOSPITAL_COMMUNITY)
Admission: RE | Admit: 2013-01-04 | Discharge: 2013-01-04 | Disposition: A | Discharge status: Home / Self Care | Payer: Medicare Other | Source: Ambulatory Visit | Attending: Family Medicine | Admitting: Family Medicine

## 2013-01-04 NOTE — Progress Notes (Signed)
Physical Therapy - Wound Therapy  Treatment   Patient Details  Name: John Sampson MRN: [id number] Date of Birth: [birth year]  Today's Date: 01/04/2013 Time: 0920-0950 Time Calculation (min): 30 min Charges: Selective debridement (= or < 20 cm)   Visit#: 7  of 9   Re-eval: 01/11/13  Subjective Subjective Assessment Subjective: Pt states that he keeps having area's come up on his feet and he doesn't know why. He states that he is not a diabetic but he checks his blood sugar often and it is always normal.  Pain Assessment Pain Assessment Pain Assessment: No/denies pain  Wound Therapy Wound 12/21/12 Contact dermatitis;Puncture Leg Left;Lateral (Active)  Site / Wound Assessment Pink;Red;Yellow;Brown;Granulation tissue 01/04/2013 10:11 AM  % Wound base Red or Granulating 60% 01/04/2013 10:11 AM  % Wound base Yellow 40% 01/04/2013 10:11 AM  % Wound base Black 0% 01/04/2013 10:11 AM  Peri-wound Assessment Edema;Erythema (blanchable) 01/04/2013 10:11 AM  Wound Length (cm) 2.4 cm 12/21/2012  4:20 PM  Wound Width (cm) 2.2 cm 12/21/2012  4:20 PM  Closure None 01/04/2013 10:11 AM  Drainage Amount Scant 01/04/2013 10:11 AM  Non-staged Wound Description Full thickness 01/04/2013 10:11 AM  Treatment Cleansed;Debridement (Selective) 01/04/2013 10:11 AM  Dressing Type Gauze (Comment);Honey;Hydrocolloid 01/04/2013 10:11 AM  Dressing Changed Changed 01/04/2013 10:11 AM  Dressing Status Clean;Dry;Intact 01/04/2013 10:11 AM     Wound 12/21/12 Puncture Toe (Comment  which one) Right great toe  (Active)  Site / Wound Assessment Yellow 01/04/2013 10:11 AM  % Wound base Red or Granulating 5% 01/04/2013 10:11 AM  % Wound base Yellow 90% 01/04/2013 10:11 AM  % Wound base Black 5% 01/04/2013 10:11 AM  Peri-wound Assessment Edema;Erythema (blanchable) 01/04/2013 10:11 AM  Wound Length (cm) 0.8 cm 12/21/2012  4:20 PM  Wound Width (cm) 1.2 cm 12/21/2012  4:20 PM  Margins Attached edges (approximated) 12/25/2012  9:30 AM  Closure None  01/04/2013 10:11 AM  Drainage Amount None 01/04/2013 10:11 AM  Treatment Cleansed;Debridement (Selective) 01/04/2013 10:11 AM  Dressing Type Gauze (Comment);Honey;Hydrocolloid 01/04/2013 10:11 AM  Dressing Changed Changed 01/01/2013  1:01 PM  Dressing Status Clean;Dry;Intact 01/04/2013 10:11 AM     Incision 12/31/11 Face Other (Comment) (Active)     Incision 12/31/11 Abdomen Other (Comment) (Active)   Selective Debridement Selective Debridement - Location: LLE and R great toe wounds Selective Debridement - Tools Used: Forceps;Scalpel Selective Debridement - Tissue Removed: Slough and dry skin   Physical Therapy Assessment and Plan Wound Therapy - Assess/Plan/Recommendations Wound Therapy - Clinical Statement: LLE wound appears moister this session and granulation has increased. One opened blister and and another open area noted in fourth left toe. Open area also present on second toe. All new areas were cleansed and covered with saline gauze. Will watch these areas. Pt plans to make appointment with his PCP to make her aware of his wounds. Wound Plan: Continue wound care selective debridement 2x a week per PT POC.      Goals    Problem List Patient Active Problem List  Diagnosis  . Anemia  . Hypertension  . High cholesterol  . MI, old  . Blood in stool    Seth Bake, PTA 01/04/2013, 11:47 AM

## 2013-01-05 ENCOUNTER — Ambulatory Visit (HOSPITAL_COMMUNITY): Payer: Medicare Other | Admitting: Physical Therapy

## 2013-01-08 ENCOUNTER — Ambulatory Visit (HOSPITAL_COMMUNITY)
Admission: RE | Admit: 2013-01-08 | Discharge: 2013-01-08 | Disposition: A | Discharge status: Home / Self Care | Payer: Medicare Other | Source: Ambulatory Visit | Attending: Family Medicine | Admitting: Family Medicine

## 2013-01-08 NOTE — Progress Notes (Signed)
Physical Therapy - Wound Therapy  Treatment   Patient Details  Name: John Sampson MRN: [id number] Date of Birth: 1935/12/29  Today's Date: 01/08/2013 Time: 1020-1055 Time Calculation (min): 35 min  Visit#: 8 of 9  Re-eval: 01/11/13 Charges: Selective debridement (= or < 20 cm)   Subjective Subjective Assessment Subjective: Pt plans to make appointment with heart and vascular doctot about wounds on B feet.  Pain Assessment Pain Assessment Pain Assessment: No/denies pain Pain Score:  (Increased to 8/10 with debridement in LLE wound)  Wound Therapy Wound 12/21/12 Contact dermatitis;Puncture Leg Left;Lateral (Active)  Site / Wound Assessment Pink;Red;Yellow;Brown;Granulation tissue 01/08/2013 11:58 AM  % Wound base Red or Granulating 60% 01/08/2013 11:58 AM  % Wound base Yellow 40% 01/08/2013 11:58 AM  % Wound base Black 0% 01/08/2013 11:58 AM  Peri-wound Assessment Edema;Erythema (blanchable) 01/08/2013 11:58 AM  Wound Length (cm) 2.4 cm 12/21/2012  4:20 PM  Wound Width (cm) 2.2 cm 12/21/2012  4:20 PM  Closure None 01/08/2013 11:58 AM  Drainage Amount Scant 01/08/2013 11:58 AM  Non-staged Wound Description Full thickness 01/08/2013 11:58 AM  Treatment Cleansed;Debridement (Selective) 01/08/2013 11:58 AM  Dressing Type Gauze (Comment);Honey;Hydrocolloid 01/08/2013 11:58 AM  Dressing Changed Changed 01/08/2013 11:58 AM  Dressing Status Clean;Dry;Intact 01/08/2013 11:58 AM     Wound 12/21/12 Puncture Toe (Comment  which one) Right great toe  (Active)  Site / Wound Assessment Yellow 01/08/2013 11:58 AM  % Wound base Red or Granulating 5% 01/08/2013 11:58 AM  % Wound base Yellow 95% 01/08/2013 11:58 AM  % Wound base Black 5% 01/04/2013 10:11 AM  Peri-wound Assessment Edema;Erythema (blanchable) 01/08/2013 11:58 AM  Wound Length (cm) 0.8 cm 12/21/2012  4:20 PM  Wound Width (cm) 1.2 cm 12/21/2012  4:20 PM  Margins Attached edges (approximated) 12/25/2012  9:30 AM  Closure None 01/08/2013 11:58 AM   Drainage Amount None 01/08/2013 11:58 AM  Treatment Cleansed;Debridement (Selective) 01/08/2013 11:58 AM  Dressing Type Gauze (Comment);Honey;Hydrocolloid 01/08/2013 11:58 AM  Dressing Changed Changed 01/08/2013 11:58 AM  Dressing Status Clean;Dry;Intact 01/08/2013 11:58 AM     Incision 12/31/11 Face Other (Comment) (Active)     Incision 12/31/11 Abdomen Other (Comment) (Active)   Selective Debridement Selective Debridement - Location: LLE and R great toe wounds Selective Debridement - Tools Used: Forceps;Scalpel Selective Debridement - Tissue Removed: Slough and dry skin   Physical Therapy Assessment and Plan Wound Therapy - Assess/Plan/Recommendations Wound Therapy - Clinical Statement: Pt presents with another small wound on third toe on LLE. All new wound on R toes are still present. All new wounds were cleansesd and covered. Encouraged pt to see vascular specialist about these wounds. All wound are without signs or sx of infection. Sensitivity to debridement of LLE wound increases with each session. Wound Plan: Continue wound care selective debridement 2x a week per PT POC.      Goals    Problem List Patient Active Problem List  Diagnosis  . Anemia  . Hypertension  . High cholesterol  . MI, old  . Blood in stool    Seth Bake, PTA  01/08/2013, 12:06 PM

## 2013-01-09 DIAGNOSIS — I739 Peripheral vascular disease, unspecified: Secondary | ICD-10-CM | POA: Diagnosis not present

## 2013-01-09 DIAGNOSIS — I2510 Atherosclerotic heart disease of native coronary artery without angina pectoris: Secondary | ICD-10-CM | POA: Diagnosis not present

## 2013-01-09 DIAGNOSIS — E782 Mixed hyperlipidemia: Secondary | ICD-10-CM | POA: Diagnosis not present

## 2013-01-09 DIAGNOSIS — Z79899 Other long term (current) drug therapy: Secondary | ICD-10-CM | POA: Diagnosis not present

## 2013-01-09 DIAGNOSIS — I872 Venous insufficiency (chronic) (peripheral): Secondary | ICD-10-CM | POA: Diagnosis not present

## 2013-01-10 ENCOUNTER — Ambulatory Visit (HOSPITAL_COMMUNITY)
Admission: RE | Admit: 2013-01-10 | Discharge: 2013-01-10 | Disposition: A | Discharge status: Home / Self Care | Payer: Medicare Other | Source: Ambulatory Visit | Attending: Family Medicine | Admitting: Family Medicine

## 2013-01-10 NOTE — Progress Notes (Signed)
Physical Therapy - Wound Therapy  Treatment   Patient Details  Name: John Sampson MRN: [id number] Date of Birth: 19-Dec-1935  Today's Date: 01/10/2013 Time: 1030-1110 Time Calculation (min): 40 min Charges:  Deb <20cm Visit#: 9 of 10  Re-eval: 01/11/13  Subjective Subjective Assessment Subjective: Pt states he went to the vascular MD and is to to have studies done next week.  Pt. reports his feet are sensitive  Pain Assessment Pain Assessment Pain Assessment: No/denies pain  Wound Therapy Wound 12/21/12 Contact dermatitis;Puncture Leg Left;Lateral (Active)  Site / Wound Assessment Pink;Red;Yellow;Brown;Granulation tissue 01/10/2013 12:52 PM  % Wound base Red or Granulating 70% 01/10/2013 12:52 PM  % Wound base Yellow 30% 01/10/2013 12:52 PM  % Wound base Black 0% 01/10/2013 12:52 PM  % Wound base Other (Comment) 0% 01/10/2013 12:52 PM  Peri-wound Assessment Edema;Erythema (blanchable) 01/08/2013 11:58 AM  Wound Length (cm) 2.4 cm 12/21/2012  4:20 PM  Wound Width (cm) 2.2 cm 12/21/2012  4:20 PM  Closure None 01/10/2013 12:52 PM  Drainage Amount Scant 01/10/2013 12:52 PM  Non-staged Wound Description Full thickness 01/08/2013 11:58 AM  Treatment Cleansed;Debridement (Selective) 01/10/2013 12:52 PM  Dressing Type Honey;Hydrocolloid 01/10/2013 12:52 PM  Dressing Changed Changed 01/10/2013 12:52 PM  Dressing Status Clean;Dry;Intact 01/10/2013 12:52 PM     Wound 12/21/12 Puncture Toe (Comment  which one) Right great toe  (Active)  Site / Wound Assessment Yellow 01/10/2013 12:52 PM  % Wound base Red or Granulating 10% 01/10/2013 12:52 PM  % Wound base Yellow 90% 01/10/2013 12:52 PM  % Wound base Black 0% 01/10/2013 12:52 PM  % Wound base Other (Comment) 0% 01/10/2013 12:52 PM  Peri-wound Assessment Edema;Erythema (blanchable) 01/08/2013 11:58 AM  Wound Length (cm) 0.8 cm 12/21/2012  4:20 PM  Wound Width (cm) 1.2 cm 12/21/2012  4:20 PM  Margins Attached edges (approximated) 12/25/2012  9:30 AM   Closure None 01/10/2013 12:52 PM  Drainage Amount Scant 01/10/2013 12:52 PM  Drainage Description Serous 01/10/2013 12:52 PM  Treatment Cleansed;Debridement (Selective) 01/10/2013 12:52 PM  Dressing Type Gauze (Comment);Honey;Hydrocolloid 01/10/2013 12:52 PM  Dressing Changed New 01/10/2013 12:52 PM  Dressing Status Clean;Dry;Intact 01/10/2013 12:52 PM     Incision 12/31/11 Face Other (Comment) (Active)     Incision 12/31/11 Abdomen Other (Comment) (Active)   Selective Debridement Selective Debridement - Location: LLE and B toes wounds Selective Debridement - Tools Used: Forceps;Scalpel Selective Debridement - Tissue Removed: Slough and dry skin   Physical Therapy Assessment and Plan Wound Therapy - Assess/Plan/Recommendations Wound Therapy - Clinical Statement: Able to debride slough from L lateral LE wound using scapel without difficulty.  Hard to debride toes due to high sensitivity and pain.  Dressed all wounds with honey hyrocolloid dressing and medipore tape to ensure dressing remains intact. Wound Plan: Continue wound care selective debridement 2x a week per PT POC.  Re-evaluate next visit.       Problem List Patient Active Problem List  Diagnosis  . Anemia  . Hypertension  . High cholesterol  . MI, old  . Blood in stool     Lurena Nida, PTA/CLT 01/10/2013, 1:15 PM

## 2013-01-12 ENCOUNTER — Ambulatory Visit (HOSPITAL_COMMUNITY): Payer: Medicare Other | Admitting: Physical Therapy

## 2013-01-16 ENCOUNTER — Ambulatory Visit (HOSPITAL_COMMUNITY)
Admission: RE | Admit: 2013-01-16 | Discharge: 2013-01-16 | Disposition: A | Discharge status: Home / Self Care | Payer: Medicare Other | Source: Ambulatory Visit | Attending: Family Medicine | Admitting: Family Medicine

## 2013-01-16 NOTE — Progress Notes (Signed)
Physical Therapy - Wound Therapy  Treatment   Patient Details  Name: John Sampson MRN: [id number] Date of Birth: 11/24/1936  Today's Date: 01/16/2013 Time: 4010-2725 Time Calculation (min): 35 min  Visit#: 10 of 10  Re-eval: 01/11/13 Charges: Selective debridement (= or < 20 cm)   Subjective Subjective Assessment Subjective: Pt states that he is waiting to hear back from MD after blood work was taken.  Pain Assessment Pain Assessment Pain Score:   8 Pain Location: Leg Pain Orientation: Left;Anterior;Lower  Wound Therapy Wound 12/21/12 Contact dermatitis;Puncture Leg Left;Lateral (Active)  Site / Wound Assessment Pink;Red;Yellow;Brown;Granulation tissue 01/16/2013 10:12 AM  % Wound base Red or Granulating 60% 01/16/2013 10:12 AM  % Wound base Yellow 40% 01/16/2013 10:12 AM  % Wound base Black 0% 01/16/2013 10:12 AM  % Wound base Other (Comment) 0% 01/16/2013 10:12 AM  Peri-wound Assessment Edema;Erythema (blanchable) 01/08/2013 11:58 AM  Wound Length (cm) 4 cm 01/16/2013 10:12 AM  Wound Width (cm) 2.5 cm 01/16/2013 10:12 AM  Wound Depth (cm) 0 cm 01/16/2013 10:12 AM  Closure None 01/16/2013 10:12 AM  Drainage Amount Scant 01/16/2013 10:12 AM  Non-staged Wound Description Full thickness 01/08/2013 11:58 AM  Treatment Cleansed;Debridement (Selective) 01/16/2013 10:12 AM  Dressing Type Honey;Hydrocolloid 01/10/2013 12:52 PM  Dressing Changed Changed 01/10/2013 12:52 PM  Dressing Status Clean;Dry;Intact 01/16/2013 10:12 AM     Wound 12/21/12 Puncture Toe (Comment  which one) Right great toe  (Active)  Site / Wound Assessment Yellow 01/16/2013 10:12 AM  % Wound base Red or Granulating 10% 01/16/2013 10:12 AM  % Wound base Yellow 90% 01/16/2013 10:12 AM  % Wound base Black 0% 01/16/2013 10:12 AM  % Wound base Other (Comment) 0% 01/16/2013 10:12 AM  Peri-wound Assessment Edema;Erythema (blanchable) 01/08/2013 11:58 AM  Wound Length (cm) 0.3 cm 01/16/2013 10:12 AM  Wound Width (cm) 0.4 cm  01/16/2013 10:12 AM  Margins Attached edges (approximated) 12/25/2012  9:30 AM  Closure None 01/16/2013 10:12 AM  Drainage Amount Scant 01/16/2013 10:12 AM  Drainage Description Serous 01/16/2013 10:12 AM  Treatment Cleansed;Debridement (Selective) 01/16/2013 10:12 AM  Dressing Type Gauze (Comment);Hydrogel 01/16/2013 10:12 AM  Dressing Changed New 01/16/2013 10:12 AM  Dressing Status Clean;Dry;Intact 01/16/2013 10:12 AM     Incision 12/31/11 Face Other (Comment) (Active)     Incision 12/31/11 Abdomen Other (Comment) (Active)   Selective Debridement Selective Debridement - Location: LLE and B toes wounds Selective Debridement - Tools Used: Forceps;Scalpel Selective Debridement - Tissue Removed: Slough and dry skin   Physical Therapy Assessment and Plan Wound Therapy - Assess/Plan/Recommendations Wound Therapy - Clinical Statement: Able to remove good amount of slough from L leg wound. Pt is becoming increasingly sensitive to debridement in all areas. All toe wounds are still present. L leg wound has increased in size but appears healthier over all. Wound Plan: Continue wound care selective debridement 2x a week per PT POC. x 4 weeks.  Goals Wound Therapy Goals - Improve the function of patient's integumentary system by progressing the wound(s) through the phases of wound healing by: Decrease Necrotic Tissue to: 0 Decrease Necrotic Tissue - Progress: Progressing toward goal Increase Granulation Tissue to: 100 Increase Granulation Tissue - Progress: Progressing toward goal Decrease Length/Width/Depth by (cm): 1.5x1.5 Decrease Length/Width/Depth - Progress: Not met Improve Drainage Characteristics:  (none) Patient/Family will be able to : educated in care and dressing changes  Patient/Family Instruction Goal - Progress: Progressing toward goal Time For Goal Achievement:  (3 weeks) Wound Therapy - Potential for Goals: Good  Problem List Patient Active Problem List  Diagnosis  . Anemia  .  Hypertension  . High cholesterol  . MI, old  . Blood in stool    GP Functional Assessment Tool Used: clinical judgement Functional Limitation: Other PT primary Other PT Primary Current Status (Z6109): At least 20 percent but less than 40 percent impaired, limited or restricted Other PT Primary Goal Status (U0454): At least 1 percent but less than 20 percent impaired, limited or restricted  Seth Bake, PTA/Cindy Russell PT  01/16/2013, 12:13 PM

## 2013-01-18 ENCOUNTER — Ambulatory Visit (HOSPITAL_COMMUNITY)
Admission: RE | Admit: 2013-01-18 | Discharge: 2013-01-18 | Disposition: A | Discharge status: Home / Self Care | Payer: Medicare Other | Source: Ambulatory Visit | Attending: Physical Therapy | Admitting: Physical Therapy

## 2013-01-18 NOTE — Progress Notes (Signed)
Physical Therapy - Wound Therapy  Treatment   Patient Details  Name: John Sampson MRN: [id number] Date of Birth: October 11, 1936  Today's Date: 01/18/2013 Time: 4098-1191 Time Calculation (min): 45 min Visit#: 11 of 18  Re-eval: 01/11/13 Charges:  Reece Levy <20cm, Deb >20cm  Subjective Subjective Assessment Subjective: Pt. states his L foot and leg hurt some at night; the R LE does not bother him.  Pain Assessment Pain Assessment Pain Assessment: No/denies pain  Wound Therapy Wound 12/21/12 Contact dermatitis;Puncture Leg Left;Lateral (Active)  Site / Wound Assessment Clean;Pink;Red;Yellow 01/18/2013  6:00 PM  % Wound base Red or Granulating 75% 01/18/2013  6:00 PM  % Wound base Yellow 25% 01/18/2013  6:00 PM  % Wound base Black 0% 01/18/2013  6:00 PM  % Wound base Other (Comment) 0% 01/18/2013  6:00 PM  Peri-wound Assessment Edema;Erythema (blanchable) 01/08/2013 11:58 AM  Wound Length (cm) 4 cm 01/16/2013 10:12 AM  Wound Width (cm) 2.5 cm 01/16/2013 10:12 AM  Wound Depth (cm) 0 cm 01/16/2013 10:12 AM  Closure None 01/18/2013  6:00 PM  Drainage Amount Minimal 01/18/2013  6:00 PM  Non-staged Wound Description Full thickness 01/08/2013 11:58 AM  Treatment Cleansed;Debridement (Selective) 01/18/2013  6:00 PM  Dressing Type Honeycomb;Hydrocolloid;Adhesive bandage 01/18/2013  6:00 PM  Dressing Changed New 01/18/2013  6:00 PM  Dressing Status Clean;Dry;Intact 01/18/2013  6:00 PM     Wound 12/21/12 Puncture Toe (Comment  which one) Right great toe  (Active)  Site / Wound Assessment Yellow 01/18/2013  6:00 PM  % Wound base Red or Granulating 15% 01/18/2013  6:00 PM  % Wound base Yellow 85% 01/18/2013  6:00 PM  % Wound base Black 0% 01/18/2013  6:00 PM  % Wound base Other (Comment) 0% 01/18/2013  6:00 PM  Peri-wound Assessment Edema;Erythema (blanchable) 01/08/2013 11:58 AM  Wound Length (cm) 0.3 cm 01/16/2013 10:12 AM  Wound Width (cm) 0.4 cm 01/16/2013 10:12 AM  Margins Attached edges (approximated) 12/25/2012   9:30 AM  Closure None 01/18/2013  6:00 PM  Drainage Amount Minimal 01/18/2013  6:00 PM  Drainage Description Serous 01/18/2013  6:00 PM  Treatment Cleansed;Debridement (Selective) 01/18/2013  6:00 PM  Dressing Type Honeycomb;Hydrocolloid;Adhesive bandage 01/18/2013  6:00 PM  Dressing Changed New 01/18/2013  6:00 PM  Dressing Status Clean;Dry;Intact 01/18/2013  6:00 PM     Incision 12/31/11 Face Other (Comment) (Active)     Incision 12/31/11 Abdomen Other (Comment) (Active)   Selective Debridement Selective Debridement - Location: LLE and B toes wounds Selective Debridement - Tools Used: Forceps;Scalpel Selective Debridement - Tissue Removed: Slough and dry skin   Physical Therapy Assessment and Plan Wound Therapy - Assess/Plan/Recommendations Wound Therapy - Clinical Statement: 4 wounds on R foot now.  All wounds with increased drainage promoting granulation from use of honey hydrocolloid dressings.  Able to remove more slough today from all wounds.  Overall improving with better tolerance for debridement today. Wound Plan: Continue wound care selective debridement 2x a week per PT POC.       Problem List Patient Active Problem List  Diagnosis  . Anemia  . Hypertension  . High cholesterol  . MI, old  . Blood in stool     Lurena Nida, PTA/CLT 01/18/2013, 6:06 PM

## 2013-01-22 ENCOUNTER — Ambulatory Visit (HOSPITAL_COMMUNITY)
Admission: RE | Admit: 2013-01-22 | Discharge: 2013-01-22 | Disposition: A | Discharge status: Home / Self Care | Payer: Medicare Other | Source: Ambulatory Visit | Attending: Family Medicine | Admitting: Family Medicine

## 2013-01-23 ENCOUNTER — Encounter (HOSPITAL_COMMUNITY): Payer: Self-pay | Admitting: Pharmacy Technician

## 2013-01-23 ENCOUNTER — Encounter (INDEPENDENT_AMBULATORY_CARE_PROVIDER_SITE_OTHER): Payer: Self-pay | Admitting: *Deleted

## 2013-01-23 ENCOUNTER — Encounter (INDEPENDENT_AMBULATORY_CARE_PROVIDER_SITE_OTHER): Payer: Self-pay | Admitting: Internal Medicine

## 2013-01-23 ENCOUNTER — Ambulatory Visit (INDEPENDENT_AMBULATORY_CARE_PROVIDER_SITE_OTHER): Payer: Medicare Other | Admitting: Internal Medicine

## 2013-01-23 ENCOUNTER — Other Ambulatory Visit (INDEPENDENT_AMBULATORY_CARE_PROVIDER_SITE_OTHER): Payer: Self-pay | Admitting: *Deleted

## 2013-01-23 VITALS — BP 140/90 | HR 82 | Temp 96.8°F | Resp 20 | Ht 69.0 in | Wt 235.1 lb

## 2013-01-23 DIAGNOSIS — D509 Iron deficiency anemia, unspecified: Secondary | ICD-10-CM

## 2013-01-23 DIAGNOSIS — R195 Other fecal abnormalities: Secondary | ICD-10-CM | POA: Diagnosis not present

## 2013-01-23 DIAGNOSIS — J449 Chronic obstructive pulmonary disease, unspecified: Secondary | ICD-10-CM | POA: Insufficient documentation

## 2013-01-23 MED ORDER — FERROUS SULFATE 325 (65 FE) MG PO TABS: 325.0000 mg | ORAL_TABLET | Freq: Two times a day (BID) | ORAL | Status: DC

## 2013-01-23 NOTE — Patient Instructions (Addendum)
Start ferrous sulfate 325 mg by mouth twice daily with meals as soon as small bowel given capsule study completed. Small bowel given capsule study to be scheduled.

## 2013-01-23 NOTE — Progress Notes (Signed)
Presenting complaint;  Iron deficiency anemia and heme positive stools.  History of present illness;  Patient is 77 year old Caucasian male who is being reevaluated at request by Dr. Susa Griffins patient's cardiologist. Today patient is accompanied by his daughter Angelique Blonder. Patient was initially evaluated in September 2013 when his hemoglobin was 10.1 and iron studies confirmed iron deficiency anemia(serum iron 17, TIBC 486, saturation 3% and ferritin 8; B12 and folate levels were normal). He underwent colonoscopy followed by EGD. Colonoscopy revealed four small polyps  and three of these were tubular adenomas. He also had multiple diverticula at sigmoid colon and few at ascending colon. EGD revealed 2 gastric ulcers at antrum with scarring and deformity. H. pylori serology was negative. He was begun on ferrous sulfate twice a day and was supposed to have H&H in one month but this did not materialize. He was seen by Dr. Alanda Amass 2 weeks ago and his H&H was 9.4 and 31.1. He had 3 Hemoccults all of which were positive. Patient denies melena or rectal bleeding. He states he has not been taking his iron as recommended. He has not experienced heartburn since his heart surgery. He has no appetite. He has lost 15 pounds in the last 4-1/2 months. He denies abdominal pain nausea vomiting or dysphagia to he feels tired and has no energy. He is retired but he works part-time as a Production designer, theatre/television/film at [employer]. He is on aspirin 162 mg daily but he does not take other NSAIDs.    Current Medications: Current Outpatient Prescriptions  Medication Sig Dispense Refill  . albuterol-ipratropium (COMBIVENT) 18-103 MCG/ACT inhaler Inhale 2 puffs into the lungs daily.       [name] aspirin EC 81 MG tablet Take 162 mg by mouth daily.      [name] diltiazem (TIAZAC) 120 MG 24 hr capsule Take 180 mg by mouth.       . fish oil-omega-3 fatty acids 1000 MG capsule Take 2 g by mouth daily.      . furosemide (LASIX) 40 MG tablet Take  40 mg by mouth daily.      [name] lisinopril (PRINIVIL,ZESTRIL) 40 MG tablet Take 20 mg by mouth daily.      . metoprolol succinate (TOPROL-XL) 50 MG 24 hr tablet Take 25 mg by mouth daily. Take with or immediately following a meal.      . Multiple Vitamin (MULTIVITAMIN) tablet Take 1 tablet by mouth daily.      [name] omeprazole (PRILOSEC) 20 MG capsule Take 20 mg by mouth daily.      . potassium chloride (MICRO-K) 10 MEQ CR capsule Take 10 mEq by mouth 2 (two) times daily.       . simvastatin (ZOCOR) 10 MG tablet Take 20 mg by mouth at bedtime.       . traMADol (ULTRAM) 50 MG tablet Take 50 mg by mouth at bedtime.      . ferrous sulfate 325 (65 FE) MG tablet Take 1 tablet (325 mg total) by mouth 2 (two) times daily with a meal.       No current facility-administered medications for this visit.   Past medical history. Coronary artery disease. He underwent CABG in 2003. Last Myoview in January 2013 was low-risk without ischemia. Diastolic dysfunction based on echo of Echo of February 2012. GERD. Hypertension. COPD. Tonsillectomy at age 18.. 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    Objective: Blood pressure 140/90, pulse 82, temperature 96.8 F (36 C), temperature source Oral, resp.  rate 20, height 5\' 9"  (1.753 m), weight 235 lb 1.6 oz (106.641 kg). Patient is alert and in no acute distress. Conjunctiva is pink. Sclera is nonicteric Oropharyngeal mucosa is normal. No neck masses or thyromegaly noted. Cardiac exam with regular rhythm normal S1 and S2. Faint systolic ejection murmur noted at LLSB. Lungs are clear to auscultation. Abdomen is symmetrical. Bowel sounds are normal. No bruits noted. Abdomen is soft and nontender without organomegaly or masses. Rectal examination deferred. No LE edema, clubbing or koilonychia noted.  Labs/studies Results: CBC Latest Ref Rng 08/18/2012 12/31/2011 12/24/2011  Hb 13.0 - 17.0 g/dL 96.0 (L) 45.4 (L) 09.8   Hct 39.0 - 52.0 % 34.9 (L) 37.3 (L) 41.9  MCV 78.0 - 100.0 fL  86.9 87.7  Plts 150 - 400 K/uL  302 310   H&H from 11/08/2013 was 9.4 and 31.1     Assessment:  Patient has iron deficiency anemia with heme positive stools. He underwent EGD and colonoscopy in September 2013 with findings as above. It was felt that he may be loosing blood from gastric ulcers but no stigmata were present. He has  never experienced melena or hematemesis. He did not take ferrous sulfate as recommended and  unfortunately did not have H&H in October,2013 as planned. Weight loss and anorexia is worrisome. However he does not have any symptoms to suggest peptic ulcer disease or gastric outlet obstruction.   Plan: Schedule small bowel given capsule study as soon as possible. While this study is primarily for small bowel but it may also allow evaluation of antrum where he previously had ulcers. Patient will start ferrous sulfate 325 mg by mouth twice a day as soon as small bowel given capsule study is completed.

## 2013-01-23 NOTE — Progress Notes (Signed)
Physical Therapy - Wound Therapy  Treatment   Patient Details  Name: John Sampson MRN: [id number] Date of Birth: 1936/10/28  Today's Date: 01/22/2013 Time: 4098-1191 Charges:  Deb <20cm, Deb >20cm  Subjective Pt. States it stings and burns when we mess with it, otherwise is OK  Wound Therapy Wound 12/21/12 Contact dermatitis;Puncture Leg Left;Lateral (Active)  Site / Wound Assessment Clean;Pink;Red;Yellow 01/22/2013 10:22 AM  % Wound base Red or Granulating 75% 01/22/2013 10:22 AM  % Wound base Yellow 25% 01/22/2013 10:22 AM  % Wound base Black 0% 01/22/2013 10:22 AM  % Wound base Other (Comment) 0% 01/22/2013 10:22 AM  Peri-wound Assessment Edema;Erythema (blanchable) 01/08/2013 11:58 AM  Wound Length (cm) 4 cm 01/16/2013 10:12 AM  Wound Width (cm) 2.5 cm 01/16/2013 10:12 AM  Wound Depth (cm) 0 cm 01/16/2013 10:12 AM  Closure None 01/22/2013 10:22 AM  Drainage Amount Minimal 01/22/2013 10:22 AM  Non-staged Wound Description Full thickness 01/08/2013 11:58 AM  Treatment Cleansed;Debridement (Selective) 01/22/2013 10:22 AM  Dressing Type Honeycomb;Hydrocolloid;Adhesive bandage 01/22/2013 10:22 AM  Dressing Changed New 01/22/2013 10:22 AM  Dressing Status Dry;Intact 01/22/2013 10:22 AM     Wound 12/21/12 Puncture Toe (Comment  which one) Right great toe  (Active)  Site / Wound Assessment Yellow 01/22/2013 10:22 AM  % Wound base Red or Granulating 15% 01/22/2013 10:22 AM  % Wound base Yellow 85% 01/22/2013 10:22 AM  % Wound base Black 0% 01/22/2013 10:22 AM  % Wound base Other (Comment) 0% 01/22/2013 10:22 AM  Peri-wound Assessment Edema;Erythema (blanchable) 01/08/2013 11:58 AM  Wound Length (cm) 0.3 cm 01/16/2013 10:12 AM  Wound Width (cm) 0.4 cm 01/16/2013 10:12 AM  Margins Attached edges (approximated) 12/25/2012  9:30 AM  Closure None 01/22/2013 10:22 AM  Drainage Amount Minimal 01/22/2013 10:22 AM  Drainage Description Serous 01/22/2013 10:22 AM  Treatment Cleansed;Debridement (Selective)  01/22/2013 10:22 AM  Dressing Type Impregnated gauze (bismuth) 01/22/2013 10:22 AM  Dressing Changed New 01/22/2013 10:22 AM  Dressing Status Clean;Dry;Intact 01/22/2013 10:22 AM     Incision 12/31/11 Face Other (Comment) (Active)     Incision 12/31/11 Abdomen Other (Comment) (Active)       Physical Therapy Assessment and Plan Assessment:  R toe wounds dryer today.  Changed dressing to xeroform to increase moisture and granulation.   Changed dressing on L shin to calcium alginate honey to absorb moisture due to noted maceration perimeter of wound.  L toe wounds and shin responding well with improving granulation. Plan:  Continue current wound POC.   Problem List Patient Active Problem List  Diagnosis  . Anemia  . Hypertension  . High cholesterol  . MI, old  . Blood in stool     Lurena Nida, PTA/CLT 01/23/2013, 8:23 AM

## 2013-01-25 ENCOUNTER — Ambulatory Visit (HOSPITAL_COMMUNITY)
Admission: RE | Admit: 2013-01-25 | Discharge: 2013-01-25 | Disposition: A | Discharge status: Home / Self Care | Payer: Medicare Other | Source: Ambulatory Visit | Attending: Internal Medicine | Admitting: Internal Medicine

## 2013-01-25 ENCOUNTER — Ambulatory Visit (HOSPITAL_COMMUNITY): Payer: Medicare Other | Admitting: Physical Therapy

## 2013-01-25 ENCOUNTER — Encounter (HOSPITAL_COMMUNITY): Payer: Self-pay

## 2013-01-25 ENCOUNTER — Encounter (HOSPITAL_COMMUNITY)
Admission: RE | Disposition: A | Discharge status: Home / Self Care | Payer: Self-pay | Source: Ambulatory Visit | Attending: Internal Medicine

## 2013-01-25 DIAGNOSIS — D509 Iron deficiency anemia, unspecified: Secondary | ICD-10-CM | POA: Diagnosis not present

## 2013-01-25 DIAGNOSIS — K253 Acute gastric ulcer without hemorrhage or perforation: Secondary | ICD-10-CM

## 2013-01-25 DIAGNOSIS — K921 Melena: Secondary | ICD-10-CM | POA: Insufficient documentation

## 2013-01-25 DIAGNOSIS — Z9889 Other specified postprocedural states: Secondary | ICD-10-CM

## 2013-01-25 DIAGNOSIS — K9089 Other intestinal malabsorption: Secondary | ICD-10-CM

## 2013-01-25 HISTORY — PX: GIVENS CAPSULE STUDY: SHX5432

## 2013-01-25 SURGERY — IMAGING PROCEDURE, GI TRACT, INTRALUMINAL, VIA CAPSULE

## 2013-01-25 NOTE — H&P (Signed)
John Sampson is an 77 y.o. male.   Chief Complaint: Patient is here for small bowel given capsule study. HPI: Patient is 77 year old Caucasian male who presents with recurrent anemia and heme-positive stools. He was initially evaluated in September 2013. He was found to have 2 small gastric ulcers without stigmata of bleeding. No lesion was found and colonoscopy. His H. pylori serology is negative. His hemoglobin has dropped again. He was seen by Dr. Alanda Amass recently and all 3 stools are heme positive. He is therefore returning for small bowel given capsule study. Rest of the details can be found in my office note from 2 days ago.  Past Medical History  Diagnosis Date  . Shortness of breath   . COPD (chronic obstructive pulmonary disease)   . Hypertension   . GERD (gastroesophageal reflux disease)   . Headache   . Coronary artery disease     SEE DR Alanda Amass...  . Myocardial infarction     Past Surgical History  Procedure Laterality Date  . Coronary artery bypass graft      2003  X  5 GRAFTS  . Eye surgery      06/2011  . Tonsillectomy      AGE 38  . Craniotomy  12/31/2011    Procedure: CRANIOTOMY HYPOPHYSECTOMY TRANSNASAL APPROACH;  Surgeon: Mariam Dollar, MD;  Location: [facility];  Service: Neurosurgery;  Laterality: N/A;  Transphenoidal Hypophysectomy With Fat Graft Harvest from right abdomen Dr. Narda Bonds to assist   . Colonoscopy  08/18/2012    Procedure: COLONOSCOPY;  Surgeon: Malissa Hippo, MD;  Location: [facility];  Service: Endoscopy;  Laterality: N/A;  1:25  . Esophagogastroduodenoscopy  08/18/2012    Procedure: ESOPHAGOGASTRODUODENOSCOPY (EGD);  Surgeon: Malissa Hippo, MD;  Location: [facility];  Service: Endoscopy;  Laterality: N/A;    History reviewed. No pertinent family history. Social History:  reports that he quit smoking about 11 years ago. He does not have any smokeless tobacco history on file. He reports that he does not drink alcohol or use illicit  drugs.  Allergies: No Known Allergies  Medications Prior to Admission  Medication Sig Dispense Refill  . albuterol-ipratropium (COMBIVENT) 18-103 MCG/ACT inhaler Inhale 2 puffs into the lungs daily.       [name] aspirin EC 81 MG tablet Take 162 mg by mouth daily.      [name] diltiazem (TIAZAC) 120 MG 24 hr capsule Take 180 mg by mouth.       . ferrous sulfate 325 (65 FE) MG tablet Take 1 tablet (325 mg total) by mouth 2 (two) times daily with a meal.      . furosemide (LASIX) 40 MG tablet Take 40 mg by mouth daily.      [name] lisinopril (PRINIVIL,ZESTRIL) 40 MG tablet Take 20 mg by mouth daily.      . metoprolol succinate (TOPROL-XL) 50 MG 24 hr tablet Take 25 mg by mouth daily. Take with or immediately following a meal.      . Multiple Vitamin (MULTIVITAMIN) tablet Take 1 tablet by mouth daily.      [name] omeprazole (PRILOSEC) 20 MG capsule Take 20 mg by mouth daily.      . potassium chloride (MICRO-K) 10 MEQ CR capsule Take 10 mEq by mouth 2 (two) times daily.       . simvastatin (ZOCOR) 10 MG tablet Take 20 mg by mouth at bedtime.       . traMADol (ULTRAM) 50 MG tablet Take  50 mg by mouth at bedtime.      . fish oil-omega-3 fatty acids 1000 MG capsule Take 2 g by mouth daily.        No results found for this or any previous visit (from the past 48 hour(s)). No results found.  ROS  Height 5\' 9"  (1.753 m), weight 235 lb (106.595 kg). Physical Exam   Assessment/Plan Iron deficiency anemia and heme positive stools. Status post EGD and colonoscopy in September 2013. Small gastric ulcers without stigmata of bleed. Small bowel given capsule study.  REHMAN,NAJEEB U 01/25/2013, 10:18 AM

## 2013-01-26 ENCOUNTER — Encounter (HOSPITAL_COMMUNITY): Payer: Self-pay | Admitting: Internal Medicine

## 2013-01-26 ENCOUNTER — Other Ambulatory Visit (HOSPITAL_COMMUNITY): Payer: Self-pay | Admitting: Cardiovascular Disease

## 2013-01-26 ENCOUNTER — Ambulatory Visit (HOSPITAL_COMMUNITY): Payer: Medicare Other

## 2013-01-30 ENCOUNTER — Ambulatory Visit (HOSPITAL_COMMUNITY)
Admission: RE | Admit: 2013-01-30 | Discharge: 2013-01-30 | Disposition: A | Discharge status: Home / Self Care | Payer: Medicare Other | Source: Ambulatory Visit | Attending: Family Medicine | Admitting: Family Medicine

## 2013-01-30 DIAGNOSIS — I10 Essential (primary) hypertension: Secondary | ICD-10-CM | POA: Insufficient documentation

## 2013-01-30 DIAGNOSIS — IMO0001 Reserved for inherently not codable concepts without codable children: Secondary | ICD-10-CM | POA: Insufficient documentation

## 2013-01-30 DIAGNOSIS — S91109A Unspecified open wound of unspecified toe(s) without damage to nail, initial encounter: Secondary | ICD-10-CM | POA: Insufficient documentation

## 2013-01-30 NOTE — Progress Notes (Signed)
Physical Therapy - Wound Therapy  Treatment   Patient Details  Name: ROHITH FAUTH MRN: [id number] Date of Birth: 10/04/1936  Today's Date: 01/30/2013 Time: 5621-3086 Time Calculation (min): 40 min  Visit#: 13 of 26  Re-eval: 02/08/13 Charges: Selective debridement (= or < 20 cm)   Subjective Subjective Assessment Subjective: Pt states that his pain comes and goes. It's worse at night.  Pain Assessment Pain Assessment Pain Score:   8 (At times) Pain Location: Leg Pain Orientation: Left;Anterior;Lower  Wound Therapy Wound 12/21/12 Contact dermatitis;Puncture Leg Left;Lateral (Active)  Site / Wound Assessment Clean;Pink;Red;Yellow 01/30/2013 12:12 PM  % Wound base Red or Granulating 75% 01/30/2013 12:12 PM  % Wound base Yellow 25% 01/30/2013 12:12 PM  % Wound base Black 0% 01/30/2013 12:12 PM  % Wound base Other (Comment) 0% 01/30/2013 12:12 PM  Peri-wound Assessment Edema;Erythema (blanchable) 01/08/2013 11:58 AM  Wound Length (cm) 4 cm 01/16/2013 10:12 AM  Wound Width (cm) 2.5 cm 01/16/2013 10:12 AM  Wound Depth (cm) 0 cm 01/16/2013 10:12 AM  Closure None 01/30/2013 12:12 PM  Drainage Amount Minimal 01/30/2013 12:12 PM  Non-staged Wound Description Full thickness 01/08/2013 11:58 AM  Treatment Cleansed;Debridement (Selective) 01/30/2013 12:12 PM  Dressing Type Compression wrap;Alginate 01/30/2013 12:12 PM  Dressing Changed New 01/30/2013 12:12 PM  Dressing Status Dry;Intact 01/30/2013 12:12 PM     Wound 12/21/12 Puncture Toe (Comment  which one) Right great toe  (Active)  Site / Wound Assessment Yellow 01/30/2013 12:12 PM  % Wound base Red or Granulating 15% 01/30/2013 12:12 PM  % Wound base Yellow 85% 01/30/2013 12:12 PM  % Wound base Black 0% 01/30/2013 12:12 PM  % Wound base Other (Comment) 0% 01/30/2013 12:12 PM  Peri-wound Assessment Edema;Erythema (blanchable) 01/08/2013 11:58 AM  Wound Length (cm) 0.3 cm 01/16/2013 10:12 AM  Wound Width (cm) 0.4 cm 01/16/2013 10:12 AM  Margins Attached edges  (approximated) 12/25/2012  9:30 AM  Closure None 01/30/2013 12:12 PM  Drainage Amount Minimal 01/30/2013 12:12 PM  Drainage Description Serous 01/30/2013 12:12 PM  Treatment Cleansed;Debridement (Selective) 01/30/2013 12:12 PM  Dressing Type Honeycomb;Hydrocolloid 01/30/2013 12:12 PM  Dressing Changed New 01/22/2013 10:22 AM  Dressing Status Clean;Dry;Intact 01/30/2013 12:12 PM     Incision 12/31/11 Face Other (Comment) (Active)     Incision 12/31/11 Abdomen Other (Comment) (Active)   Selective Debridement Selective Debridement - Location: LLE and B toes wounds Selective Debridement - Tools Used: Forceps;Scalpel Selective Debridement - Tissue Removed: Slough and dry skin   Physical Therapy Assessment and Plan Wound Therapy - Assess/Plan/Recommendations Wound Therapy - Clinical Statement: L leg wound has enlarged secondary to maceration. Changed dressing on original wound to silver alginate. Xeroform applied to new opened area. Vasline applied to perimeter of all wounds. All toe wounds were dressed with xeroform except for R great toe. It was dressed with honey hydrocolloid. LLE was wrapped with kerlex and coban with minimal pressure. Unable to use tape dressing secondary to skin irritation. Advised pt to take wrap off is it slid or his toes changed color. Wound Plan: Continue wound care selective debridement 2x a week per PT POC.      Goals    Problem List Patient Active Problem List  Diagnosis  . Anemia  . Hypertension  . High cholesterol  . CAD (coronary artery disease)  . Blood in stool  . COPD (chronic obstructive pulmonary disease)    Seth Bake, PTA  01/30/2013, 12:17 PM

## 2013-02-01 ENCOUNTER — Telehealth (INDEPENDENT_AMBULATORY_CARE_PROVIDER_SITE_OTHER): Payer: Self-pay | Admitting: *Deleted

## 2013-02-01 DIAGNOSIS — D649 Anemia, unspecified: Secondary | ICD-10-CM | POA: Diagnosis not present

## 2013-02-01 NOTE — Telephone Encounter (Signed)
Per Dr.Rehman the patient will need to have H/H drawn on 02/02/13

## 2013-02-01 NOTE — Op Note (Signed)
Small Bowel Givens Capsule Study Procedure date:  01/25/2013.  Referring Provider:  Dr. Susa Griffins, MD PCP:  Dr. Tawny Hopping, PA-C  Indication for procedure:  Patient is 77 year old Caucasian male with recurrent iron deficiency anemia and heme-positive stools. He was initially evaluated in September 2013 with EGD and colonoscopy. 2 small gastric ulcers were found without stigmata of bleed. His H. pylori serology was negative. He now returns with symptomatic anemia and 3 out of 3 heme positive stools. He has not experienced melena or rectal bleeding. He is therefore undergoing small bowel given capsule study. Procedure and risks were reviewed with the patient and informed consent was obtained.  Findings:  Patient was able to swallow given capsule without any difficulty. Small amount of black material liquid noted at antrum but no ulcers or other mucosal abnormalities noted. Three small lymphangiectasia noted. No erosions ulcers or AV malformations noted.   First Gastric image:  1 min and 29 sec First Duodenal image: 11 min and 30 sec. First Ileo-Cecal Valve image: 4 hr, 27 min and 46 sec First Cecal image: 4 hr and  29 min Gastric Passage time:  10 min. Small Bowel Passage time:  4 hr and  18 min  Summary & Recommendation; No lesions noted to small bowel mucosa to account for patient's occult GI bleed. Small amount of black material coating antral mucosa which may or may not be coffee-ground material. Patient is not  taking iron pills. Will repeat patient's H&H later this week and determine if if he should undergo repeat EGD(last one in September 2013).

## 2013-02-02 ENCOUNTER — Ambulatory Visit (HOSPITAL_COMMUNITY): Payer: Medicare Other | Admitting: *Deleted

## 2013-02-06 ENCOUNTER — Ambulatory Visit (HOSPITAL_COMMUNITY)
Admission: RE | Admit: 2013-02-06 | Discharge: 2013-02-06 | Disposition: A | Discharge status: Home / Self Care | Payer: Medicare Other | Source: Ambulatory Visit | Attending: Family Medicine | Admitting: Family Medicine

## 2013-02-06 DIAGNOSIS — D649 Anemia, unspecified: Secondary | ICD-10-CM | POA: Diagnosis not present

## 2013-02-06 LAB — HEMOGLOBIN AND HEMATOCRIT, BLOOD: Hemoglobin: 9.5 g/dL — ABNORMAL LOW (ref 13.0–17.0)

## 2013-02-06 NOTE — Progress Notes (Signed)
Physical Therapy - Wound Therapy  Treatment   Patient Details  Name: John Sampson MRN: [id number] Date of Birth: March 24, 1936  Today's Date: 02/06/2013 Time: 1520-1600 Time Calculation (min): 40 min  Visit#: 14 of 26  Re-eval: 02/13/13 Charges: Selective debridement (= or < 20 cm)   Subjective Subjective Assessment Subjective: Pt states that he returns to vascular MD tomorrow.  Pain Assessment Pain Assessment Pain Score: 0-No pain (Increases at night and during debridement)  Wound Therapy Wound 12/21/12 Contact dermatitis;Puncture Leg Left;Lateral (Active)  Site / Wound Assessment Clean;Pink;Red;Yellow 02/06/2013  5:01 PM  % Wound base Red or Granulating 75% 02/06/2013  5:01 PM  % Wound base Yellow 25% 02/06/2013  5:01 PM  % Wound base Black 0% 02/06/2013  5:01 PM  % Wound base Other (Comment) 0% 02/06/2013  5:01 PM  Peri-wound Assessment Erythema (blanchable) 02/06/2013  5:01 PM  Wound Length (cm) 4 cm 01/16/2013 10:12 AM  Wound Width (cm) 2.5 cm 01/16/2013 10:12 AM  Wound Depth (cm) 0 cm 01/16/2013 10:12 AM  Closure None 02/06/2013  5:01 PM  Drainage Amount Minimal 02/06/2013  5:01 PM  Non-staged Wound Description Full thickness 01/08/2013 11:58 AM  Treatment Cleansed;Debridement (Selective) 02/06/2013  5:01 PM  Dressing Type Compression wrap;Alginate 02/06/2013  5:01 PM  Dressing Changed New 02/06/2013  5:01 PM  Dressing Status Dry;Intact 02/06/2013  5:01 PM     Wound 12/21/12 Puncture Toe (Comment  which one) Right great toe  (Active)  Site / Wound Assessment Yellow 02/06/2013  5:01 PM  % Wound base Red or Granulating 5% 02/06/2013  5:01 PM  % Wound base Yellow 95% 02/06/2013  5:01 PM  % Wound base Black 0% 02/06/2013  5:01 PM  % Wound base Other (Comment) 0% 02/06/2013  5:01 PM  Peri-wound Assessment Edema;Erythema (blanchable) 01/08/2013 11:58 AM  Wound Length (cm) 0.3 cm 01/16/2013 10:12 AM  Wound Width (cm) 0.4 cm 01/16/2013 10:12 AM  Margins Attached edges (approximated) 12/25/2012   9:30 AM  Closure None 02/06/2013  5:01 PM  Drainage Amount Minimal 02/06/2013  5:01 PM  Drainage Description Serous 02/06/2013  5:01 PM  Treatment Cleansed;Debridement (Selective) 02/06/2013  5:01 PM  Dressing Type Honeycomb;Hydrocolloid 02/06/2013  5:01 PM  Dressing Changed New 02/06/2013  5:01 PM  Dressing Status Clean;Dry;Intact 02/06/2013  5:01 PM     Incision 12/31/11 Face Other (Comment) (Active)     Incision 12/31/11 Abdomen Other (Comment) (Active)   Selective Debridement Selective Debridement - Location: LLE and B toes wounds Selective Debridement - Tools Used: Forceps;Scalpel Selective Debridement - Tissue Removed: Slough and dry skin   Physical Therapy Assessment and Plan Wound Therapy - Assess/Plan/Recommendations Wound Therapy - Clinical Statement: Lt leg wound appears to have approximated slightly. Wounds due to maceration below original would seem to be healing well. All toe wound are healed except for one on Rt great toe and two on Lt 3rd toe. Compression wrap not applied secondary to discomfort after last session. Pt reports 0/10 pain at end of session. Wound Plan: Continue wound care selective debridement 2x a week per PT POC.  Problem List Patient Active Problem List  Diagnosis  . Anemia  . Hypertension  . High cholesterol  . CAD (coronary artery disease)  . Blood in stool  . COPD (chronic obstructive pulmonary disease)   Seth Bake, PTA  02/06/2013, 5:09 PM

## 2013-02-07 ENCOUNTER — Ambulatory Visit (HOSPITAL_COMMUNITY)
Admission: RE | Admit: 2013-02-07 | Discharge: 2013-02-07 | Disposition: A | Discharge status: Home / Self Care | Payer: Medicare Other | Source: Ambulatory Visit | Attending: Cardiovascular Disease | Admitting: Cardiovascular Disease

## 2013-02-07 DIAGNOSIS — I517 Cardiomegaly: Secondary | ICD-10-CM | POA: Diagnosis not present

## 2013-02-07 DIAGNOSIS — I714 Abdominal aortic aneurysm, without rupture, unspecified: Secondary | ICD-10-CM | POA: Insufficient documentation

## 2013-02-07 DIAGNOSIS — I2510 Atherosclerotic heart disease of native coronary artery without angina pectoris: Secondary | ICD-10-CM | POA: Diagnosis not present

## 2013-02-07 DIAGNOSIS — L98499 Non-pressure chronic ulcer of skin of other sites with unspecified severity: Secondary | ICD-10-CM | POA: Diagnosis not present

## 2013-02-07 DIAGNOSIS — I359 Nonrheumatic aortic valve disorder, unspecified: Secondary | ICD-10-CM | POA: Insufficient documentation

## 2013-02-07 DIAGNOSIS — J4489 Other specified chronic obstructive pulmonary disease: Secondary | ICD-10-CM | POA: Insufficient documentation

## 2013-02-07 DIAGNOSIS — I739 Peripheral vascular disease, unspecified: Secondary | ICD-10-CM

## 2013-02-07 DIAGNOSIS — R0989 Other specified symptoms and signs involving the circulatory and respiratory systems: Secondary | ICD-10-CM | POA: Diagnosis not present

## 2013-02-07 DIAGNOSIS — I25810 Atherosclerosis of coronary artery bypass graft(s) without angina pectoris: Secondary | ICD-10-CM

## 2013-02-07 DIAGNOSIS — Z951 Presence of aortocoronary bypass graft: Secondary | ICD-10-CM | POA: Insufficient documentation

## 2013-02-07 DIAGNOSIS — I079 Rheumatic tricuspid valve disease, unspecified: Secondary | ICD-10-CM | POA: Insufficient documentation

## 2013-02-07 DIAGNOSIS — J449 Chronic obstructive pulmonary disease, unspecified: Secondary | ICD-10-CM

## 2013-02-07 DIAGNOSIS — I059 Rheumatic mitral valve disease, unspecified: Secondary | ICD-10-CM | POA: Diagnosis not present

## 2013-02-07 DIAGNOSIS — R9431 Abnormal electrocardiogram [ECG] [EKG]: Secondary | ICD-10-CM | POA: Diagnosis not present

## 2013-02-07 DIAGNOSIS — I70219 Atherosclerosis of native arteries of extremities with intermittent claudication, unspecified extremity: Secondary | ICD-10-CM | POA: Diagnosis not present

## 2013-02-07 HISTORY — DX: Chronic obstructive pulmonary disease, unspecified: J44.9

## 2013-02-07 NOTE — Progress Notes (Signed)
2D Echo Performed 02/07/2013    Clearence Ped, RCS

## 2013-02-07 NOTE — Progress Notes (Signed)
BLE Arterial Duplex Completed. Sturdivant, Rita D  

## 2013-02-07 NOTE — Progress Notes (Signed)
Aorta Duplex Completed. John Sampson D  

## 2013-02-08 ENCOUNTER — Ambulatory Visit (HOSPITAL_COMMUNITY)
Admission: RE | Admit: 2013-02-08 | Discharge: 2013-02-08 | Disposition: A | Discharge status: Home / Self Care | Payer: Medicare Other | Source: Ambulatory Visit | Attending: Family Medicine | Admitting: Family Medicine

## 2013-02-08 NOTE — Progress Notes (Signed)
Physical Therapy - Wound Therapy  Treatment   Patient Details  Name: John Sampson MRN: [id number] Date of Birth: 1935-12-08  Today's Date: 02/08/2013 Time: 4098-1191 Time Calculation (min): 35 min  Visit#: 15 of 26  Re-eval: 02/13/13 Charges: Selective debridement (= or < 20 cm)   Subjective Subjective Assessment Subjective: Pt states that he had a doppler study completed yesterday along with some other tests. He will find out results tomorrow. Pt also states that his left leg is feeling much better.  Pain Assessment Pain Assessment Pain Assessment: No/denies pain  Wound Therapy Wound 12/21/12 Contact dermatitis;Puncture Leg Left;Lateral (Active)  Site / Wound Assessment Clean;Pink;Red;Yellow 02/08/2013  5:16 PM  % Wound base Red or Granulating 75% 02/08/2013  5:16 PM  % Wound base Yellow 25% 02/08/2013  5:16 PM  % Wound base Black 0% 02/08/2013  5:16 PM  % Wound base Other (Comment) 0% 02/08/2013  5:16 PM  Peri-wound Assessment Erythema (blanchable) 02/08/2013  5:16 PM  Wound Length (cm) 4 cm 01/16/2013 10:12 AM  Wound Width (cm) 2.5 cm 01/16/2013 10:12 AM  Wound Depth (cm) 0 cm 01/16/2013 10:12 AM  Closure None 02/08/2013  5:16 PM  Drainage Amount Minimal 02/08/2013  5:16 PM  Non-staged Wound Description Full thickness 01/08/2013 11:58 AM  Treatment Cleansed;Debridement (Selective) 02/08/2013  5:16 PM  Dressing Type Compression wrap;Alginate 02/08/2013  5:16 PM  Dressing Changed New 02/08/2013  5:16 PM  Dressing Status Dry;Intact 02/08/2013  5:16 PM     Wound 12/21/12 Puncture Toe (Comment  which one) Right great toe  (Active)  Site / Wound Assessment Yellow 02/08/2013  5:16 PM  % Wound base Red or Granulating 0% 02/08/2013  5:16 PM  % Wound base Yellow 100% 02/08/2013  5:16 PM  % Wound base Black 0% 02/08/2013  5:16 PM  % Wound base Other (Comment) 0% 02/08/2013  5:16 PM  Peri-wound Assessment Edema;Erythema (blanchable) 01/08/2013 11:58 AM  Wound Length (cm) 0.3 cm 01/16/2013 10:12 AM   Wound Width (cm) 0.4 cm 01/16/2013 10:12 AM  Margins Attached edges (approximated) 12/25/2012  9:30 AM  Closure None 02/08/2013  5:16 PM  Drainage Amount Minimal 02/08/2013  5:16 PM  Drainage Description Serous 02/08/2013  5:16 PM  Treatment Cleansed;Debridement (Selective) 02/08/2013  5:16 PM  Dressing Type Impregnated gauze (bismuth);Gauze (Comment) 02/08/2013  5:16 PM  Dressing Changed New 02/08/2013  5:16 PM  Dressing Status Clean;Dry;Intact 02/08/2013  5:16 PM     Incision 12/31/11 Face Other (Comment) (Active)     Incision 12/31/11 Abdomen Other (Comment) (Active)   Selective Debridement Selective Debridement - Location: LLE and B toe wounds Selective Debridement - Tools Used: Forceps;Scalpel Selective Debridement - Tissue Removed: Slough and dry skin   Physical Therapy Assessment and Plan Wound Therapy - Assess/Plan/Recommendations Wound Therapy - Clinical Statement: Lt leg wound appears to be progressing better. Wound is responding well to silver alginate. Areas below original wound continue to progress well. Pt continues to be sensitive to debride but pt is having less pain over all in left leg wound. Wound Plan: Reassess next session.  Problem List Patient Active Problem List  Diagnosis  . Anemia  . Hypertension  . High cholesterol  . CAD (coronary artery disease)  . Blood in stool  . COPD (chronic obstructive pulmonary disease)    Seth Bake, PTA  02/08/2013, 5:20 PM

## 2013-02-09 ENCOUNTER — Encounter (HOSPITAL_COMMUNITY): Payer: Self-pay | Admitting: Pharmacy Technician

## 2013-02-09 ENCOUNTER — Other Ambulatory Visit: Payer: Self-pay | Admitting: Cardiovascular Disease

## 2013-02-09 ENCOUNTER — Telehealth (INDEPENDENT_AMBULATORY_CARE_PROVIDER_SITE_OTHER): Payer: Self-pay | Admitting: *Deleted

## 2013-02-09 DIAGNOSIS — J449 Chronic obstructive pulmonary disease, unspecified: Secondary | ICD-10-CM | POA: Diagnosis not present

## 2013-02-09 DIAGNOSIS — I2510 Atherosclerotic heart disease of native coronary artery without angina pectoris: Secondary | ICD-10-CM | POA: Diagnosis not present

## 2013-02-09 DIAGNOSIS — E782 Mixed hyperlipidemia: Secondary | ICD-10-CM | POA: Diagnosis not present

## 2013-02-09 DIAGNOSIS — I739 Peripheral vascular disease, unspecified: Secondary | ICD-10-CM | POA: Diagnosis not present

## 2013-02-09 NOTE — Telephone Encounter (Signed)
Lab due in 2 weeks per Dr.Rehman Patient will need hemoccult also

## 2013-02-13 ENCOUNTER — Ambulatory Visit (HOSPITAL_COMMUNITY)
Admission: RE | Admit: 2013-02-13 | Discharge: 2013-02-13 | Disposition: A | Discharge status: Home / Self Care | Payer: Medicare Other | Source: Ambulatory Visit | Attending: Family Medicine | Admitting: Family Medicine

## 2013-02-13 NOTE — Progress Notes (Signed)
Physical Therapy - Wound Therapy  Treatment   Patient Details  Name: John Sampson MRN: [id number] Date of Birth: 1936/02/27  Today's Date: 02/13/2013 Time: 6295-2841 Time Calculation (min): 38 min  Visit#: 16 of 26  Re-eval: 02/13/13 Charges: Selective debridement (= or < 20 cm)  Subjective Subjective Assessment Subjective: Pt states that he is scheduled for an angiogram on BLE this coming Monday.  Pain Assessment Pain Assessment Pain Score:   8 (At entry) Pain Location: Leg Pain Orientation: Left;Anterior;Lower  Wound Therapy Wound 12/21/12 Contact dermatitis;Puncture Leg Left;Lateral (Active)  Site / Wound Assessment Clean;Pink;Red;Yellow 02/13/2013  4:42 PM  % Wound base Red or Granulating 75% 02/13/2013  4:42 PM  % Wound base Yellow 25% 02/13/2013  4:42 PM  % Wound base Black 0% 02/13/2013  4:42 PM  % Wound base Other (Comment) 0% 02/13/2013  4:42 PM  Peri-wound Assessment Erythema (blanchable) 02/13/2013  4:42 PM  Wound Length (cm) 4 cm 01/16/2013 10:12 AM  Wound Width (cm) 2.5 cm 01/16/2013 10:12 AM  Wound Depth (cm) 0 cm 01/16/2013 10:12 AM  Closure None 02/13/2013  4:42 PM  Drainage Amount Minimal 02/13/2013  4:42 PM  Non-staged Wound Description Full thickness 01/08/2013 11:58 AM  Treatment Cleansed;Debridement (Selective) 02/13/2013  4:42 PM  Dressing Type Compression wrap;Alginate 02/13/2013  4:42 PM  Dressing Changed New 02/13/2013  4:42 PM  Dressing Status Dry;Intact 02/13/2013  4:42 PM     Wound 12/21/12 Puncture Toe (Comment  which one) Right great toe  (Active)  Site / Wound Assessment Yellow 02/08/2013  5:16 PM  % Wound base Red or Granulating 0% 02/13/2013  4:42 PM  % Wound base Yellow 100% 02/13/2013  4:42 PM  % Wound base Black 0% 02/13/2013  4:42 PM  % Wound base Other (Comment) 0% 02/13/2013  4:42 PM  Peri-wound Assessment Edema;Erythema (blanchable) 01/08/2013 11:58 AM  Wound Length (cm) 0.3 cm 01/16/2013 10:12 AM  Wound Width (cm) 0.4 cm 01/16/2013 10:12 AM   Margins Attached edges (approximated) 12/25/2012  9:30 AM  Closure None 02/13/2013  4:42 PM  Drainage Amount Minimal 02/13/2013  4:42 PM  Drainage Description Serous 02/13/2013  4:42 PM  Treatment Cleansed;Debridement (Selective) 02/13/2013  4:42 PM  Dressing Type Impregnated gauze (bismuth);Gauze (Comment) 02/13/2013  4:42 PM  Dressing Changed New 02/13/2013  4:42 PM  Dressing Status Clean;Dry;Intact 02/13/2013  4:42 PM     Incision 12/31/11 Face Other (Comment) (Active)     Incision 12/31/11 Abdomen Other (Comment) (Active)   Selective Debridement Selective Debridement - Location: LLE and B toe wounds Selective Debridement - Tools Used: Forceps;Scalpel Selective Debridement - Tissue Removed: Slough and dry skin   Physical Therapy Assessment and Plan Wound Therapy - Assess/Plan/Recommendations Wound Therapy - Clinical Statement: All wounds are progressing nicely. All toe wounds are healed except for L great toe wound. Pt continues to be sensitive to debridement. Pt reports pain decrease to 0/10 after cleansing and debridement. Wound Plan: Reassess next session.  Problem List Patient Active Problem List  Diagnosis  . Anemia  . Hypertension  . High cholesterol  . CAD (coronary artery disease)  . Blood in stool  . COPD (chronic obstructive pulmonary disease)    Seth Bake, PTA  02/13/2013, 4:55 PM

## 2013-02-14 ENCOUNTER — Other Ambulatory Visit (HOSPITAL_COMMUNITY): Payer: Self-pay | Admitting: Cardiovascular Disease

## 2013-02-14 ENCOUNTER — Ambulatory Visit (HOSPITAL_COMMUNITY)
Admission: RE | Admit: 2013-02-14 | Discharge: 2013-02-14 | Disposition: A | Discharge status: Home / Self Care | Payer: Medicare Other | Source: Ambulatory Visit | Attending: Cardiovascular Disease | Admitting: Cardiovascular Disease

## 2013-02-14 DIAGNOSIS — Z01818 Encounter for other preprocedural examination: Secondary | ICD-10-CM | POA: Diagnosis not present

## 2013-02-14 DIAGNOSIS — D65 Disseminated intravascular coagulation [defibrination syndrome]: Secondary | ICD-10-CM | POA: Diagnosis not present

## 2013-02-14 DIAGNOSIS — Z79899 Other long term (current) drug therapy: Secondary | ICD-10-CM | POA: Diagnosis not present

## 2013-02-14 DIAGNOSIS — N390 Urinary tract infection, site not specified: Secondary | ICD-10-CM | POA: Diagnosis not present

## 2013-02-14 DIAGNOSIS — Z01811 Encounter for preprocedural respiratory examination: Secondary | ICD-10-CM

## 2013-02-16 ENCOUNTER — Ambulatory Visit (HOSPITAL_COMMUNITY)
Admission: RE | Admit: 2013-02-16 | Discharge: 2013-02-16 | Disposition: A | Discharge status: Home / Self Care | Payer: Medicare Other | Source: Ambulatory Visit | Attending: Family Medicine | Admitting: Family Medicine

## 2013-02-16 NOTE — Progress Notes (Signed)
Physical Therapy - Wound Therapy Reassessment  Treatment   Patient Details  Name: John Sampson MRN: [id number] Date of Birth: Oct 29, 1936  Today's Date: 02/16/2013 Time: 4098-1191 Time Calculation (min): 38 min Charge: selective debridement <20 cm  Visit#: 17 of 26  Re-eval: 03/16/13  Subjective Subjective Assessment Subjective: Intermittent pain, none currently.  Pt scheduled for angiogram BLE next Monday.  Pain Assessment Pain Assessment Pain Assessment: No/denies pain  Wound Therapy  02/16/13 1800  Subjective Assessment  Subjective Intermittent pain, none currently.  Pt scheduled for angiogram BLE next Monday.  Wound  Date First Assessed/Time First Assessed: 12/21/12 1525   Wound Type: Contact dermatitis;Puncture  Location: Leg  Location Orientation: Left;Lateral  % Wound base Red or Granulating 80%  ( at initial evaluation this was 100% black)  % Wound base Yellow 20%  % Wound base Black 0%  % Wound base Other (Comment) 0%  Peri-wound Assessment Erythema (blanchable)  Closure None  Drainage Amount Minimal  Dressing Type Impregnated gauze (bismuth);Gauze (Comment);Compression wrap  Dressing Status Clean;Dry;Intact  Wound  Date First Assessed/Time First Assessed: 12/21/12 1550   Wound Type: Puncture  Location: Toe (Comment  which one)  Location Orientation: Right  Wound Description (Comments): great toe   Present on Admission: Yes  Site / Wound Assessment Granulation tissue  % Wound base Red or Granulating 100% ( at evaluation this was 100% black )  % Wound base Yellow 0%  Closure None  Drainage Amount None  Selective Debridement  Selective Debridement - Location LLE  Selective Debridement - Tools Used Forceps;Scalpel  Selective Debridement - Tissue Removed Slough and dry skin  Wound Therapy - Assess/Plan/Recommendations  Wound Therapy - Clinical Statement RT great toe wound fully healed today, no wound care needed for great toe today.  LT LE wounds continue to  improve in granulation tissue, pt able to tolerate debrdiement with removal of yellow slough and dry skin.   Wound Plan Continue wound care selective debridement to LT LE, continue to check on RT toes.    Selective Debridement Selective Debridement - Location: LLE Selective Debridement - Tools Used: Forceps;Scalpel Selective Debridement - Tissue Removed: Slough and dry skin   Physical Therapy Assessment and Plan Wound Therapy - Assess/Plan/Recommendations Wound Therapy - Clinical Statement: RT great toe wound fully healed today, no wound care needed for great toe today.  LT LE wounds continue to improve in granulation tissue, pt able to tolerate debrdiement with removal of yellow slough and dry skin.  Wound Plan: Continue wound care selective debridement to LT LE, continue to check on RT toes.      Goals  Goal 1)  Wounds to be 100% granulated- progressing  Goal 2)  Necrotic tissue to be 0 %- met  Goal 3)  Wound to be healed- progressing.  Goal 4)   Wound to be decreased in size t0 1.5x1.5-progressing  Goal 5)   Pt /familly to be knowledgeable in wound dressing- met Problem List Patient Active Problem List  Diagnosis  . Anemia  . Hypertension  . High cholesterol  . CAD (coronary artery disease)  . Blood in stool  . COPD (chronic obstructive pulmonary disease)    GP    [name] 02/16/2013, 6:50 PM Your signature is required to indicate approval of the treatment plan as stated above.  Please sign and return making a copy for your files.  You may hard copy or send electronically.  Please check one: ___1.  Approve of this plan  ___2.  Approve of this plan with the following changes.   ____________________________                             _____________ Physician                                                                      Date

## 2013-02-19 ENCOUNTER — Ambulatory Visit (HOSPITAL_COMMUNITY)
Admission: RE | Admit: 2013-02-19 | Discharge: 2013-02-19 | Disposition: A | Discharge status: Home / Self Care | Payer: Medicare Other | Source: Ambulatory Visit | Attending: Cardiovascular Disease | Admitting: Cardiovascular Disease

## 2013-02-19 ENCOUNTER — Encounter (HOSPITAL_COMMUNITY)
Admission: RE | Disposition: A | Discharge status: Home / Self Care | Payer: Self-pay | Source: Ambulatory Visit | Attending: Cardiovascular Disease

## 2013-02-19 DIAGNOSIS — L97809 Non-pressure chronic ulcer of other part of unspecified lower leg with unspecified severity: Secondary | ICD-10-CM | POA: Diagnosis not present

## 2013-02-19 DIAGNOSIS — D649 Anemia, unspecified: Secondary | ICD-10-CM | POA: Diagnosis not present

## 2013-02-19 DIAGNOSIS — I872 Venous insufficiency (chronic) (peripheral): Secondary | ICD-10-CM | POA: Insufficient documentation

## 2013-02-19 DIAGNOSIS — L97509 Non-pressure chronic ulcer of other part of unspecified foot with unspecified severity: Secondary | ICD-10-CM | POA: Diagnosis not present

## 2013-02-19 DIAGNOSIS — I7092 Chronic total occlusion of artery of the extremities: Secondary | ICD-10-CM | POA: Diagnosis not present

## 2013-02-19 DIAGNOSIS — Z7982 Long term (current) use of aspirin: Secondary | ICD-10-CM | POA: Diagnosis not present

## 2013-02-19 DIAGNOSIS — Z79899 Other long term (current) drug therapy: Secondary | ICD-10-CM | POA: Insufficient documentation

## 2013-02-19 DIAGNOSIS — I70209 Unspecified atherosclerosis of native arteries of extremities, unspecified extremity: Secondary | ICD-10-CM | POA: Diagnosis not present

## 2013-02-19 DIAGNOSIS — E669 Obesity, unspecified: Secondary | ICD-10-CM | POA: Insufficient documentation

## 2013-02-19 DIAGNOSIS — Z6835 Body mass index (BMI) 35.0-35.9, adult: Secondary | ICD-10-CM | POA: Insufficient documentation

## 2013-02-19 DIAGNOSIS — L98499 Non-pressure chronic ulcer of skin of other sites with unspecified severity: Secondary | ICD-10-CM | POA: Diagnosis not present

## 2013-02-19 DIAGNOSIS — I739 Peripheral vascular disease, unspecified: Secondary | ICD-10-CM | POA: Diagnosis not present

## 2013-02-19 HISTORY — PX: LOWER EXTREMITY ANGIOGRAM: SHX5508

## 2013-02-19 HISTORY — PX: ABDOMINAL AORTAGRAM: SHX5454

## 2013-02-19 HISTORY — PX: OTHER SURGICAL HISTORY: SHX169

## 2013-02-19 SURGERY — ANGIOGRAM, LOWER EXTREMITY
Anesthesia: LOCAL

## 2013-02-19 MED ORDER — LIDOCAINE HCL (PF) 1 % IJ SOLN
INTRAMUSCULAR | Status: AC
  Filled 2013-02-19: qty 30

## 2013-02-19 MED ORDER — FENTANYL CITRATE 0.05 MG/ML IJ SOLN
INTRAMUSCULAR | Status: AC
  Filled 2013-02-19: qty 2

## 2013-02-19 MED ORDER — ASPIRIN 81 MG PO CHEW
324.0000 mg | CHEWABLE_TABLET | ORAL | Status: AC
  Administered 2013-02-19: 324 mg via ORAL
  Filled 2013-02-19: qty 4

## 2013-02-19 MED ORDER — SODIUM CHLORIDE 0.9 % IV SOLN
INTRAVENOUS | Status: DC
  Administered 2013-02-19: 1000 mL via INTRAVENOUS

## 2013-02-19 MED ORDER — SODIUM CHLORIDE 0.9 % IJ SOLN: 3.0000 mL | Freq: Two times a day (BID) | INTRAMUSCULAR | Status: DC

## 2013-02-19 MED ORDER — HYDRALAZINE HCL 20 MG/ML IJ SOLN: 10.0000 mg | INTRAMUSCULAR | Status: DC

## 2013-02-19 MED ORDER — SODIUM CHLORIDE 0.9 % IJ SOLN: 3.0000 mL | INTRAMUSCULAR | Status: DC | PRN

## 2013-02-19 MED ORDER — SODIUM CHLORIDE 0.9 % IV SOLN: 250.0000 mL | INTRAVENOUS | Status: DC | PRN

## 2013-02-19 NOTE — CV Procedure (Signed)
John Sampson is a 77 y.o. male    [id number] LOCATION:  FACILITY: [facility]  PHYSICIAN: Nanetta Batty, M.D. [birth year]   DATE OF PROCEDURE:  02/19/2013  DATE OF DISCHARGE:  [facility]  PV ANGIOGRAM    History obtained from chart review.Mr. Ozment is a 77 year old moderately overweight Caucasian male patient of Dr. Susa Griffins is referred for peripheral vascular evaluation because of a slowly healing left calf ulcer. He has had ulcers on his left toes which have healed. His other problems include Court coronary artery disease status post remote past grafting in 2003. He has hyperlipidemia and venous insufficiency. He had a left calf Ulcer which has been treated at the wound care center and apparently a slowly healing. Dr. Alanda Amass obtained  arterial Dopplers in our office and suggested hemodynamically significant disease in the left SFA. He presents now for angiography and potential intervention.   PROCEDURE DESCRIPTION:    The patient was brought to the second floor [facility] cath lab in the postabsorptive state. He was premedicated with Valium 5 mg by mouth. His right groinwas prepped and shaved in usual sterile fashion. Xylocaine 1% was used for local anesthesia. A 5 French sheath was inserted into the right common femoral artery using standard Seldinger technique. A 5 French pigtail catheter is used for abdominal aortography with bifemoral runoff using bolus chase digital subtraction settable technique. Visipaque dye was used for the entirety of the case. Retrograde aortic pressure was monitored during the case.   HEMODYNAMICS:    AO SYSTOLIC/AO DIASTOLIC: 176/66   ANGIOGRAPHIC RESULTS:   1: Abdominal aortogram-renal arteries are widely patent. The infrarenal abdominal aorta had mild to moderate atherosclerotic changes.  2: Left lower extremity-there was a 50% segmental calcified mid left SFA stenosis with 2 vessel runoff and a functionally  occluded anterior tibial.  3: Right lower extremity-60% segmental calcified mid right SFA stenosis with three-vessel runoff occluded anterior tibial   IMPRESSION:Mr. Mousseau has moderate segmental calcified mid SFA stenoses with 2 vessel runoff bilaterally. Anterior tibials are occluded bilaterally. Nothings these are continuing to the poorly healing left calf ulcer but more likely this is a venous ulcer which is slowly healing using the aggressive local therapy. The sheath was removed and pressure was held on the groin to achieve hemostasis. The patient left the Cath Lab in stable condition. He'll be gently hydrated and discharged home. He will followup with Dr. Susa Griffins.  Runell Gess MD, [facility] 02/19/2013 11:36 AM

## 2013-02-19 NOTE — H&P (Signed)
  H & P will be scanned in.  Pt was reexamined and existing H & P reviewed. No changes found.  Runell Gess, MD [facility] 02/19/2013 11:09 AM

## 2013-02-20 ENCOUNTER — Encounter (INDEPENDENT_AMBULATORY_CARE_PROVIDER_SITE_OTHER): Payer: Self-pay

## 2013-02-21 ENCOUNTER — Ambulatory Visit (HOSPITAL_COMMUNITY)
Admission: RE | Admit: 2013-02-21 | Discharge: 2013-02-21 | Disposition: A | Discharge status: Home / Self Care | Payer: Medicare Other | Source: Ambulatory Visit | Attending: Family Medicine | Admitting: Family Medicine

## 2013-02-23 ENCOUNTER — Ambulatory Visit (HOSPITAL_COMMUNITY)
Admission: RE | Admit: 2013-02-23 | Discharge: 2013-02-23 | Disposition: A | Discharge status: Home / Self Care | Payer: Medicare Other | Source: Ambulatory Visit | Attending: Family Medicine | Admitting: Family Medicine

## 2013-02-23 NOTE — Progress Notes (Signed)
Physical Therapy - Wound Therapy  Treatment   Patient Details  Name: John Sampson MRN: [id number] Date of Birth: 1936-07-10  Today's Date: 02/23/2013 Time: 7829-5621 Time Calculation (min): 38 min Charge: selective debrdiement <20 cm  Visit#: 18 of 26  Re-eval: 03/16/13  Subjective Subjective Assessment Subjective: Pt reported increased pain distal wound on Lt LE an hour following treatment last session.  Pt has apt with MD on Monday.  Pain Assessment Pain Assessment Pain Score:   5 Pain Location: Leg Pain Orientation: Left;Anterior;Posterior;Lower  Wound Therapy  02/23/13 1200  Subjective Assessment  Subjective Pt reported increased pain distal wound on Lt LE an hour following treatment last session.  Pt has apt with MD on Monday.  Wound  Date First Assessed/Time First Assessed: 12/21/12 1525   Wound Type: Contact dermatitis;Puncture  Location: Leg  Location Orientation: Left;Lateral  Site / Wound Assessment Clean;Pink;Red;Yellow  % Wound base Red or Granulating 85%  % Wound base Yellow 15%  % Wound base Black 0%  % Wound base Other (Comment) 0%  Peri-wound Assessment Erythema (blanchable)  Closure None  Drainage Amount Minimal  Treatment Cleansed;Debridement (Selective)  Dressing Type Impregnated gauze (bismuth);Gauze (Comment);Compression wrap  Dressing Changed New  Dressing Status Clean;Dry;Intact  Selective Debridement  Selective Debridement - Location LLE  Selective Debridement - Tools Used Forceps;Scalpel  Selective Debridement - Tissue Removed Slough and dry skin  Wound Therapy - Assess/Plan/Recommendations  Wound Therapy - Clinical Statement Able to remove more yellow slough on distal wound with no c/o increased pain during debridement.  Held compression wrap per pt requrest for pain control.  Rt toes are fully healed.   Selective Debridement Selective Debridement - Location: LLE Selective Debridement - Tools Used: Forceps;Scalpel Selective Debridement -  Tissue Removed: Slough and dry skin   Physical Therapy Assessment and Plan Wound Therapy - Assess/Plan/Recommendations Wound Therapy - Clinical Statement: Able to remove more yellow slough on distal wound with no c/o increased pain during debridement.  Held compression wrap per pt requrest for pain control.  Rt toes are fully healed.      Goals    Problem List Patient Active Problem List  Diagnosis  . Anemia  . Hypertension  . High cholesterol  . CAD (coronary artery disease)  . Blood in stool  . COPD (chronic obstructive pulmonary disease)    GP    [name] 02/23/2013, 12:11 PM

## 2013-02-26 ENCOUNTER — Ambulatory Visit (HOSPITAL_COMMUNITY): Payer: Medicare Other | Admitting: *Deleted

## 2013-02-26 ENCOUNTER — Ambulatory Visit (HOSPITAL_COMMUNITY)
Admission: RE | Admit: 2013-02-26 | Discharge: 2013-02-26 | Disposition: A | Discharge status: Home / Self Care | Payer: Medicare Other | Source: Ambulatory Visit | Attending: Family Medicine | Admitting: Family Medicine

## 2013-02-26 NOTE — Progress Notes (Signed)
Physical Therapy - Wound Therapy  Treatment   Patient Details  Name: John Sampson MRN: [id number] Date of Birth: May 13, 1936  Today's Date: 02/26/2013 Time: 4098-1191 Time Calculation (min): 32 min  Visit#: 19 of 26  Re-eval: 03/16/13 Charges: Selective debridement (= or < 20 cm)   Subjective Subjective Assessment Subjective: Pt reports decreased pain in LLE wound.  Pain Assessment Pain Assessment Pain Score:   3  Wound Therapy Wound 12/21/12 Contact dermatitis;Puncture Leg Left;Lateral (Active)  Site / Wound Assessment Clean;Pink;Red;Yellow 02/26/2013  9:00 AM  % Wound base Red or Granulating 85% 02/26/2013  9:00 AM  % Wound base Yellow 15% 02/26/2013  9:00 AM  % Wound base Black 0% 02/26/2013  9:00 AM  % Wound base Other (Comment) 0% 02/26/2013  9:00 AM  Peri-wound Assessment Erythema (blanchable) 02/26/2013  9:00 AM  Wound Length (cm) 4 cm 02/16/2013  5:16 PM  Wound Width (cm) 2.4 cm 02/16/2013  5:16 PM  Wound Depth (cm) 0 cm 02/16/2013 11:00 AM  Closure None 02/26/2013  9:00 AM  Drainage Amount Minimal 02/26/2013  9:00 AM  Non-staged Wound Description Full thickness 01/08/2013 11:58 AM  Treatment Cleansed;Debridement (Selective) 02/26/2013  9:00 AM  Dressing Type Impregnated gauze (bismuth);Gauze (Comment);Compression wrap 02/26/2013  9:00 AM  Dressing Changed New 02/26/2013  9:00 AM  Dressing Status Clean;Dry;Intact 02/26/2013  9:00 AM     Wound 12/21/12 Puncture Toe (Comment  which one) Right great toe  (Active)  Site / Wound Assessment Granulation tissue 02/21/2013  3:56 PM  % Wound base Red or Granulating 100% 02/21/2013  3:56 PM  % Wound base Yellow 0% 02/21/2013  3:56 PM  % Wound base Black 0% 02/13/2013  4:42 PM  % Wound base Other (Comment) 0% 02/13/2013  4:42 PM  Peri-wound Assessment Edema;Erythema (blanchable) 01/08/2013 11:58 AM  Wound Length (cm) 0.1 cm 02/16/2013 11:00 AM  Wound Width (cm) 0.1 cm 02/16/2013 11:00 AM  Margins Attached edges (approximated) 12/25/2012  9:30  AM  Closure None 02/21/2013  3:56 PM  Drainage Amount None 02/21/2013  3:56 PM  Drainage Description Serous 02/13/2013  4:42 PM  Treatment Cleansed;Debridement (Selective) 02/21/2013  3:56 PM  Dressing Type Impregnated gauze (bismuth);Gauze (Comment) 02/21/2013  3:56 PM  Dressing Changed New 02/13/2013  4:42 PM  Dressing Status Clean;Dry;Intact 02/13/2013  4:42 PM     Incision 12/31/11 Face Other (Comment) (Active)     Incision 12/31/11 Abdomen Other (Comment) (Active)   Selective Debridement Selective Debridement - Location: LLE Selective Debridement - Tools Used: Forceps;Scalpel Selective Debridement - Tissue Removed: Slough and dry skin   Physical Therapy Assessment and Plan Wound Therapy - Assess/Plan/Recommendations Wound Therapy - Clinical Statement: Wound appears healthier this session. Wound is approximating well and appears to be responding well to xeroform. Mild maceration noted at perimeter. Vaseline applied to protect skin integrity. Pt presents with improved tolerance for debridement. Wound Plan: Continue wound care selective debridement to LT LE, continue to check on RT toes.  Problem List Patient Active Problem List  Diagnosis  . Anemia  . Hypertension  . High cholesterol  . CAD (coronary artery disease)  . Blood in stool  . COPD (chronic obstructive pulmonary disease)    Seth Bake, PTA  02/26/2013, 9:26 AM

## 2013-02-27 ENCOUNTER — Ambulatory Visit (HOSPITAL_COMMUNITY)
Admission: RE | Admit: 2013-02-27 | Discharge: 2013-02-27 | Disposition: A | Discharge status: Home / Self Care | Payer: Medicare Other | Source: Ambulatory Visit | Attending: Cardiovascular Disease | Admitting: Cardiovascular Disease

## 2013-02-27 DIAGNOSIS — M7989 Other specified soft tissue disorders: Secondary | ICD-10-CM | POA: Diagnosis not present

## 2013-02-27 DIAGNOSIS — L98499 Non-pressure chronic ulcer of skin of other sites with unspecified severity: Secondary | ICD-10-CM

## 2013-02-27 DIAGNOSIS — I739 Peripheral vascular disease, unspecified: Secondary | ICD-10-CM | POA: Diagnosis not present

## 2013-02-27 NOTE — Progress Notes (Signed)
Venous Duplex Lower Ext. Completed. Sherylann Vangorden D  

## 2013-03-02 ENCOUNTER — Ambulatory Visit (HOSPITAL_COMMUNITY)
Admission: RE | Admit: 2013-03-02 | Discharge: 2013-03-02 | Disposition: A | Discharge status: Home / Self Care | Payer: Medicare Other | Source: Ambulatory Visit | Attending: Family Medicine | Admitting: Family Medicine

## 2013-03-02 DIAGNOSIS — S91109A Unspecified open wound of unspecified toe(s) without damage to nail, initial encounter: Secondary | ICD-10-CM | POA: Diagnosis not present

## 2013-03-02 DIAGNOSIS — IMO0001 Reserved for inherently not codable concepts without codable children: Secondary | ICD-10-CM | POA: Diagnosis not present

## 2013-03-02 DIAGNOSIS — I10 Essential (primary) hypertension: Secondary | ICD-10-CM | POA: Diagnosis not present

## 2013-03-02 NOTE — Progress Notes (Signed)
Physical Therapy - Wound Therapy  Treatment   Patient Details  Name: John Sampson MRN: [id number] Date of Birth: 1936-07-12  Today's Date: 03/02/2013 Time: 0825-0850 Time Calculation (min): 25 min  Visit#: 20 of 26  Re-eval: 03/16/13 Charges: Selective debridement (= or < 20 cm)   Subjective Subjective Assessment Subjective: Pt states that he has not had any issue from wound since lasts tx.  Pain Assessment Pain Assessment Pain Assessment: No/denies pain  Wound Therapy Wound 12/21/12 Contact dermatitis;Puncture Leg Left;Lateral (Active)  Site / Wound Assessment Clean;Pink;Red;Yellow 03/02/2013  8:57 AM  % Wound base Red or Granulating 85% 03/02/2013  8:57 AM  % Wound base Yellow 15% 03/02/2013  8:57 AM  % Wound base Black 0% 03/02/2013  8:57 AM  % Wound base Other (Comment) 0% 03/02/2013  8:57 AM  Peri-wound Assessment Erythema (blanchable) 03/02/2013  8:57 AM  Wound Length (cm) 4 cm 02/16/2013  5:16 PM  Wound Width (cm) 2.4 cm 02/16/2013  5:16 PM  Wound Depth (cm) 0 cm 02/16/2013 11:00 AM  Closure None 03/02/2013  8:57 AM  Drainage Amount Minimal 03/02/2013  8:57 AM  Non-staged Wound Description Full thickness 01/08/2013 11:58 AM  Treatment Cleansed;Debridement (Selective) 03/02/2013  8:57 AM  Dressing Type Impregnated gauze (bismuth);Gauze (Comment);Compression wrap 03/02/2013  8:57 AM  Dressing Changed Changed 03/02/2013  8:57 AM  Dressing Status Clean;Dry;Intact 03/02/2013  8:57 AM     Wound 12/21/12 Puncture Toe (Comment  which one) Right great toe  (Active)  Site / Wound Assessment Granulation tissue 02/21/2013  3:56 PM  % Wound base Red or Granulating 100% 02/21/2013  3:56 PM  % Wound base Yellow 0% 02/21/2013  3:56 PM  % Wound base Black 0% 02/13/2013  4:42 PM  % Wound base Other (Comment) 0% 02/13/2013  4:42 PM  Peri-wound Assessment Edema;Erythema (blanchable) 01/08/2013 11:58 AM  Wound Length (cm) 0.1 cm 02/16/2013 11:00 AM  Wound Width (cm) 0.1 cm 02/16/2013 11:00 AM  Margins Attached  edges (approximated) 12/25/2012  9:30 AM  Closure None 02/21/2013  3:56 PM  Drainage Amount None 02/21/2013  3:56 PM  Drainage Description Serous 02/13/2013  4:42 PM  Treatment Cleansed;Debridement (Selective) 02/21/2013  3:56 PM  Dressing Type Impregnated gauze (bismuth);Gauze (Comment) 02/21/2013  3:56 PM  Dressing Changed New 02/13/2013  4:42 PM  Dressing Status Clean;Dry;Intact 02/13/2013  4:42 PM     Incision 12/31/11 Face Other (Comment) (Active)     Incision 12/31/11 Abdomen Other (Comment) (Active)   Selective Debridement Selective Debridement - Location: LLE Selective Debridement - Tools Used: Forceps;Scalpel Selective Debridement - Tissue Removed: Slough   Physical Therapy Assessment and Plan Wound Therapy - Assess/Plan/Recommendations Wound Therapy - Clinical Statement: Wound has approximated nicely since last tx. Wound and periwound appear overall healthier. Pt presents with improved tolerance for debridement. Pt reports 0/10 pain at end of session. Wound Plan: Continue wound care selective debridement to LT LE, continue to check on RT toes.  Problem List Patient Active Problem List  Diagnosis  . Anemia  . Hypertension  . High cholesterol  . CAD (coronary artery disease)  . Blood in stool  . COPD (chronic obstructive pulmonary disease)    GP Functional Limitation: Other PT primary Other PT Primary Current Status (Z3086): At least 1 percent but less than 20 percent impaired, limited or restricted Other PT Primary Goal Status (V7846): At least 1 percent but less than 20 percent impaired, limited or restricted  Seth Bake, PTA  03/02/2013, 9:02 AM

## 2013-03-06 ENCOUNTER — Ambulatory Visit (HOSPITAL_COMMUNITY)
Admission: RE | Admit: 2013-03-06 | Discharge: 2013-03-06 | Disposition: A | Discharge status: Home / Self Care | Payer: Medicare Other | Source: Ambulatory Visit | Attending: Family Medicine | Admitting: Family Medicine

## 2013-03-06 DIAGNOSIS — I10 Essential (primary) hypertension: Secondary | ICD-10-CM | POA: Diagnosis not present

## 2013-03-06 DIAGNOSIS — S91109A Unspecified open wound of unspecified toe(s) without damage to nail, initial encounter: Secondary | ICD-10-CM | POA: Diagnosis not present

## 2013-03-06 DIAGNOSIS — IMO0001 Reserved for inherently not codable concepts without codable children: Secondary | ICD-10-CM | POA: Diagnosis not present

## 2013-03-06 NOTE — Progress Notes (Signed)
Physical Therapy - Wound Therapy  Treatment   Patient Details  Name: SHERI PROWS MRN: [id number] Date of Birth: 1936/05/19  Today's Date: 03/06/2013 Time: 4098-1191 Time Calculation (min): 30 min  Visit#: 21 of 26  Re-eval: 03/16/13 Charges: Selective debridement (= or < 20 cm)   Subjective Subjective Assessment Subjective: Pt states that area on LLE feels much better.  Pain Assessment Pain Assessment Pain Assessment: No/denies pain  Wound Therapy Wound 12/21/12 Contact dermatitis;Puncture Leg Left;Lateral (Active)  Site / Wound Assessment Clean;Pink;Red;Yellow 03/06/2013  4:22 PM  % Wound base Red or Granulating 85% 03/06/2013  4:22 PM  % Wound base Yellow 15% 03/06/2013  4:22 PM  % Wound base Black 0% 03/06/2013  4:22 PM  % Wound base Other (Comment) 0% 03/06/2013  4:22 PM  Peri-wound Assessment Erythema (blanchable) 03/06/2013  4:22 PM  Wound Length (cm) 4 cm 02/16/2013  5:16 PM  Wound Width (cm) 2.4 cm 02/16/2013  5:16 PM  Wound Depth (cm) 0 cm 02/16/2013 11:00 AM  Closure None 03/06/2013  4:22 PM  Drainage Amount Minimal 03/06/2013  4:22 PM  Non-staged Wound Description Full thickness 01/08/2013 11:58 AM  Treatment Cleansed;Debridement (Selective) 03/06/2013  4:22 PM  Dressing Type Gauze (Comment);Silver hydrofiber 03/06/2013  4:22 PM  Dressing Changed Changed 03/06/2013  4:22 PM  Dressing Status Clean;Dry;Intact 03/06/2013  4:22 PM     Wound 12/21/12 Puncture Toe (Comment  which one) Right great toe  (Active)  Site / Wound Assessment Granulation tissue 02/21/2013  3:56 PM  % Wound base Red or Granulating 100% 02/21/2013  3:56 PM  % Wound base Yellow 0% 02/21/2013  3:56 PM  % Wound base Black 0% 02/13/2013  4:42 PM  % Wound base Other (Comment) 0% 02/13/2013  4:42 PM  Peri-wound Assessment Edema;Erythema (blanchable) 01/08/2013 11:58 AM  Wound Length (cm) 0.1 cm 02/16/2013 11:00 AM  Wound Width (cm) 0.1 cm 02/16/2013 11:00 AM  Margins Attached edges (approximated) 12/25/2012  9:30 AM  Closure  None 02/21/2013  3:56 PM  Drainage Amount None 02/21/2013  3:56 PM  Drainage Description Serous 02/13/2013  4:42 PM  Treatment Cleansed;Debridement (Selective) 02/21/2013  3:56 PM  Dressing Type Impregnated gauze (bismuth);Gauze (Comment) 02/21/2013  3:56 PM  Dressing Changed New 02/13/2013  4:42 PM  Dressing Status Clean;Dry;Intact 02/13/2013  4:42 PM     Incision 12/31/11 Face Other (Comment) (Active)     Incision 12/31/11 Abdomen Other (Comment) (Active)   Selective Debridement Selective Debridement - Location: LLE Selective Debridement - Tools Used: Forceps;Scalpel Selective Debridement - Tissue Removed: Slough and dry skin   Physical Therapy Assessment and Plan Wound Therapy - Assess/Plan/Recommendations Wound Therapy - Clinical Statement: Wound continues to approximate well. Original wound presents with increase maceration and hypergranulation. Silver hydrofiber applied to decrease these. Small areas below original wound dressed with xeroform. Pt tolerates debridement well. Wound Plan: Continue wound care selective debridement to LLE, continue to check toes on bilateral feet.   Problem List Patient Active Problem List  Diagnosis  . Anemia  . Hypertension  . High cholesterol  . CAD (coronary artery disease)  . Blood in stool  . COPD (chronic obstructive pulmonary disease)    Seth Bake, PTA  03/06/2013, 4:30 PM

## 2013-03-09 ENCOUNTER — Ambulatory Visit (HOSPITAL_COMMUNITY)
Admission: RE | Admit: 2013-03-09 | Discharge: 2013-03-09 | Disposition: A | Discharge status: Home / Self Care | Payer: Medicare Other | Source: Ambulatory Visit | Attending: Family Medicine | Admitting: Family Medicine

## 2013-03-09 DIAGNOSIS — IMO0001 Reserved for inherently not codable concepts without codable children: Secondary | ICD-10-CM | POA: Diagnosis not present

## 2013-03-09 DIAGNOSIS — I10 Essential (primary) hypertension: Secondary | ICD-10-CM | POA: Diagnosis not present

## 2013-03-09 DIAGNOSIS — S91109A Unspecified open wound of unspecified toe(s) without damage to nail, initial encounter: Secondary | ICD-10-CM | POA: Diagnosis not present

## 2013-03-09 NOTE — Progress Notes (Signed)
Physical Therapy - Wound Therapy  Treatment   Patient Details  Name: John Sampson MRN: [id number] Date of Birth: 1936-03-09  Today's Date: 03/09/2013 Time: 2130-8657 Time Calculation (min): 28 min Charge: selective debridement < 20 cm  Visit#: 22 of 26  Re-eval: 03/16/13  Subjective Subjective Assessment Subjective: Pt reported pain has been reduced for the last 3 weeks, feels like his wound is progressing well towards heeling.  Pain Assessment Pain Assessment Pain Assessment: No/denies pain  Wound Therapy  03/09/13 1517  Subjective Assessment  Subjective Pt reported pain has been reduced for the last 3 weeks, feels like his wound is progressing well towards heeling.  Wound  Date First Assessed/Time First Assessed: 12/21/12 1525   Wound Type: Contact dermatitis;Puncture  Location: Leg  Location Orientation: Left;Lateral  Site / Wound Assessment Clean;Pink;Red;Yellow  % Wound base Red or Granulating 90%  % Wound base Yellow 10%  % Wound base Black 0%  Peri-wound Assessment Erythema (blanchable)  Drainage Amount Minimal  Treatment Cleansed;Debridement (Selective)  Dressing Type Gauze (Comment);Silver hydrofiber  Dressing Changed New  Dressing Status Clean;Dry;Intact  Selective Debridement  Selective Debridement - Location LLE  Selective Debridement - Tools Used Forceps;Scalpel  Selective Debridement - Tissue Removed Slough and dry skin  Wound Therapy - Assess/Plan/Recommendations  Wound Therapy - Clinical Statement Wound continues to improve in reduction of overall size.  Slough easy to remove with saline with 90% granulation following debrdiement.  No report of pain through session.  Wound Plan Continue wound care selective debridement to LLE, continue to check toes on bilateral feet.    Selective Debridement Selective Debridement - Location: LLE Selective Debridement - Tools Used: Forceps;Scalpel Selective Debridement - Tissue Removed: Slough and dry skin    Physical Therapy Assessment and Plan Wound Therapy - Assess/Plan/Recommendations Wound Therapy - Clinical Statement: Wound continues to improve in reduction of overall size.  Slough easy to remove with saline with 90% granulation following debrdiement.  No report of pain through session. Wound Plan: Continue wound care selective debridement to LLE, continue to check toes on bilateral feet.      Goals    Problem List Patient Active Problem List  Diagnosis  . Anemia  . Hypertension  . High cholesterol  . CAD (coronary artery disease)  . Blood in stool  . COPD (chronic obstructive pulmonary disease)    GP    [name] 03/09/2013, 3:55 PM

## 2013-03-14 ENCOUNTER — Ambulatory Visit (HOSPITAL_COMMUNITY)
Admission: RE | Admit: 2013-03-14 | Discharge: 2013-03-14 | Disposition: A | Discharge status: Home / Self Care | Payer: Medicare Other | Source: Ambulatory Visit | Attending: Family Medicine | Admitting: Family Medicine

## 2013-03-14 DIAGNOSIS — I10 Essential (primary) hypertension: Secondary | ICD-10-CM | POA: Diagnosis not present

## 2013-03-14 DIAGNOSIS — IMO0001 Reserved for inherently not codable concepts without codable children: Secondary | ICD-10-CM | POA: Diagnosis not present

## 2013-03-14 DIAGNOSIS — S91109A Unspecified open wound of unspecified toe(s) without damage to nail, initial encounter: Secondary | ICD-10-CM | POA: Diagnosis not present

## 2013-03-14 NOTE — Progress Notes (Signed)
Physical Therapy - Wound Therapy  Treatment   Patient Details  Name: FERRIS FIELDEN MRN: [id number] Date of Birth: [birth year]  Today's Date: 03/14/2013 Time: 6295-2841 Time Calculation (min): 27 min  Visit#: 23 of 26  Re-eval: 03/16/13 Charges: Selective debridement (= or < 20 cm)   Subjective Subjective Assessment Subjective: Pt is without pain or complaint.  Pain Assessment Pain Assessment Pain Assessment: No/denies pain  Wound Therapy Wound 12/21/12 Contact dermatitis;Puncture Leg Left;Lateral (Active)  Site / Wound Assessment Clean;Pink;Red;Yellow 03/14/2013 10:01 AM  % Wound base Red or Granulating 90% 03/14/2013 10:01 AM  % Wound base Yellow 10% 03/14/2013 10:01 AM  % Wound base Black 0% 03/14/2013 10:01 AM  % Wound base Other (Comment) 0% 03/06/2013  4:22 PM  Peri-wound Assessment Erythema (blanchable) 03/14/2013 10:01 AM  Wound Length (cm) 4 cm 02/16/2013  5:16 PM  Wound Width (cm) 2.4 cm 02/16/2013  5:16 PM  Wound Depth (cm) 0 cm 02/16/2013 11:00 AM  Closure None 03/06/2013  4:22 PM  Drainage Amount Minimal 03/14/2013 10:01 AM  Non-staged Wound Description Full thickness 01/08/2013 11:58 AM  Treatment Cleansed;Debridement (Selective) 03/14/2013 10:01 AM  Dressing Type Gauze (Comment);Silver hydrofiber 03/14/2013 10:01 AM  Dressing Changed Changed 03/14/2013 10:01 AM  Dressing Status Clean;Dry;Intact 03/14/2013 10:01 AM     Wound 12/21/12 Puncture Toe (Comment  which one) Right great toe  (Active)  Site / Wound Assessment Granulation tissue 02/21/2013  3:56 PM  % Wound base Red or Granulating 100% 02/21/2013  3:56 PM  % Wound base Yellow 0% 02/21/2013  3:56 PM  % Wound base Black 0% 02/13/2013  4:42 PM  % Wound base Other (Comment) 0% 02/13/2013  4:42 PM  Peri-wound Assessment Edema;Erythema (blanchable) 01/08/2013 11:58 AM  Wound Length (cm) 0.1 cm 02/16/2013 11:00 AM  Wound Width (cm) 0.1 cm 02/16/2013 11:00 AM  Margins Attached edges (approximated) 12/25/2012  9:30 AM  Closure None  02/21/2013  3:56 PM  Drainage Amount None 02/21/2013  3:56 PM  Drainage Description Serous 02/13/2013  4:42 PM  Treatment Cleansed;Debridement (Selective) 02/21/2013  3:56 PM  Dressing Type Impregnated gauze (bismuth);Gauze (Comment) 02/21/2013  3:56 PM  Dressing Changed New 02/13/2013  4:42 PM  Dressing Status Clean;Dry;Intact 02/13/2013  4:42 PM     Incision 12/31/11 Face Other (Comment) (Active)     Incision 12/31/11 Abdomen Other (Comment) (Active)   Selective Debridement Selective Debridement - Location: LLE Selective Debridement - Tools Used: Forceps;Scalpel Selective Debridement - Tissue Removed: Slough and dry skin   Physical Therapy Assessment and Plan Wound Therapy - Assess/Plan/Recommendations Wound Therapy - Clinical Statement: Wounds continues to aproximate well. Wound appears to be responding well to silver hydrofiber. Maceration noted around wound. Vasline applied liberaly to protect skin integrity. Pt tolerate debridement wel. Pt reprots 0/10 pain at end of session. Wound Plan: Continue wound care selective debridement to LLE, continue to check toes on bilateral feet.  Problem List Patient Active Problem List  Diagnosis  . Anemia  . Hypertension  . High cholesterol  . CAD (coronary artery disease)  . Blood in stool  . COPD (chronic obstructive pulmonary disease)   Seth Bake, PTA  03/14/2013, 10:05 AM

## 2013-03-16 ENCOUNTER — Ambulatory Visit (HOSPITAL_COMMUNITY): Payer: Medicare Other | Admitting: Physical Therapy

## 2013-03-19 ENCOUNTER — Ambulatory Visit (HOSPITAL_COMMUNITY)
Admission: RE | Admit: 2013-03-19 | Discharge: 2013-03-19 | Disposition: A | Discharge status: Home / Self Care | Payer: Medicare Other | Source: Ambulatory Visit | Attending: Family Medicine | Admitting: Family Medicine

## 2013-03-19 DIAGNOSIS — I10 Essential (primary) hypertension: Secondary | ICD-10-CM | POA: Diagnosis not present

## 2013-03-19 DIAGNOSIS — S91109A Unspecified open wound of unspecified toe(s) without damage to nail, initial encounter: Secondary | ICD-10-CM | POA: Diagnosis not present

## 2013-03-19 DIAGNOSIS — IMO0001 Reserved for inherently not codable concepts without codable children: Secondary | ICD-10-CM | POA: Diagnosis not present

## 2013-03-19 NOTE — Progress Notes (Signed)
Physical Therapy - Wound Therapy  Treatment   Patient Details  Name: John Sampson MRN: [id number] Date of Birth: [birth year]  Today's Date: 03/19/2013 Time: 9629-5284 Time Calculation (min): 18 min Charges: Deb < 20 cm Visit#: 24 of 26  Re-eval: 03/16/13  Subjective Subjective Assessment Subjective: No pain today, bandaging has been staying up well using the tape.   Pain Assessment    Wound Therapy Wound 12/21/12 Contact dermatitis;Puncture Leg Left;Lateral (Active)  Site / Wound Assessment Clean;Pink;Red;Yellow 03/19/2013 11:53 AM  % Wound base Red or Granulating 90% 03/19/2013 11:53 AM  % Wound base Yellow 10% 03/19/2013 11:53 AM  % Wound base Black 0% 03/19/2013 11:53 AM  % Wound base Other (Comment) 0% 03/06/2013  4:22 PM  Peri-wound Assessment Erythema (blanchable) 03/19/2013 11:53 AM  Wound Length (cm) 4 cm 02/16/2013  5:16 PM  Wound Width (cm) 2.4 cm 02/16/2013  5:16 PM  Wound Depth (cm) 0 cm 02/16/2013 11:00 AM  Closure None 03/06/2013  4:22 PM  Drainage Amount Minimal 03/19/2013 11:53 AM  Non-staged Wound Description Full thickness 01/08/2013 11:58 AM  Treatment Cleansed;Debridement (Selective) 03/14/2013 10:01 AM  Dressing Type Gauze (Comment);Silver hydrofiber 03/19/2013 11:53 AM  Dressing Changed Changed 03/14/2013 10:01 AM  Dressing Status Clean;Dry;Intact 03/19/2013 11:53 AM     Wound 12/21/12 Puncture Toe (Comment  which one) Right great toe  (Active)  Site / Wound Assessment Granulation tissue 02/21/2013  3:56 PM  % Wound base Red or Granulating 100% 02/21/2013  3:56 PM  % Wound base Yellow 0% 02/21/2013  3:56 PM  % Wound base Black 0% 02/13/2013  4:42 PM  % Wound base Other (Comment) 0% 02/13/2013  4:42 PM  Peri-wound Assessment Edema;Erythema (blanchable) 01/08/2013 11:58 AM  Wound Length (cm) 0.1 cm 02/16/2013 11:00 AM  Wound Width (cm) 0.1 cm 02/16/2013 11:00 AM  Margins Attached edges (approximated) 12/25/2012  9:30 AM  Closure None 02/21/2013  3:56 PM  Drainage Amount None  02/21/2013  3:56 PM  Drainage Description Serous 02/13/2013  4:42 PM  Treatment Cleansed;Debridement (Selective) 02/21/2013  3:56 PM  Dressing Type Impregnated gauze (bismuth);Gauze (Comment) 02/21/2013  3:56 PM  Dressing Changed New 02/13/2013  4:42 PM  Dressing Status Clean;Dry;Intact 02/13/2013  4:42 PM     Incision 12/31/11 Face Other (Comment) (Active)     Incision 12/31/11 Abdomen Other (Comment) (Active)   Selective Debridement Selective Debridement - Location: LLE Selective Debridement - Tools Used: Forceps;Scalpel Selective Debridement - Tissue Removed: Slough and dry skin   Physical Therapy Assessment and Plan Wound Therapy - Assess/Plan/Recommendations Wound Therapy - Clinical Statement: Continues to show improved approximation, decreased pain and improved granulation, without maceration today.  Pt tolerated debridment well.       Goals    Problem List Patient Active Problem List  Diagnosis  . Anemia  . Hypertension  . High cholesterol  . CAD (coronary artery disease)  . Blood in stool  . COPD (chronic obstructive pulmonary disease)    GP    Donyea Beverlin, PT 03/19/2013, 11:57 AM

## 2013-03-23 ENCOUNTER — Ambulatory Visit (HOSPITAL_COMMUNITY)
Admission: RE | Admit: 2013-03-23 | Discharge: 2013-03-23 | Disposition: A | Discharge status: Home / Self Care | Payer: Medicare Other | Source: Ambulatory Visit | Attending: Family Medicine | Admitting: Family Medicine

## 2013-03-23 DIAGNOSIS — IMO0001 Reserved for inherently not codable concepts without codable children: Secondary | ICD-10-CM | POA: Diagnosis not present

## 2013-03-23 DIAGNOSIS — S91109A Unspecified open wound of unspecified toe(s) without damage to nail, initial encounter: Secondary | ICD-10-CM | POA: Diagnosis not present

## 2013-03-23 DIAGNOSIS — I10 Essential (primary) hypertension: Secondary | ICD-10-CM | POA: Diagnosis not present

## 2013-03-23 NOTE — Progress Notes (Signed)
Physical Therapy - Wound Therapy  Treatment   Patient Details  Name: John Sampson MRN: [id number] Date of Birth: 1936-03-05  Today's Date: 03/23/2013 Time: 1025-1040 Time Calculation (min): 15 min Charge: selective debridement < 20 cm  Visit#: 25 of 26  Re-eval: 03/16/13  Subjective Subjective Assessment Subjective: No pain today, pt happy with wounds progress  Pain Assessment Pain Assessment Pain Assessment: No/denies pain  Wound Therapy  03/23/13 1043  Subjective Assessment  Subjective No pain today, pt happy with wounds progress  Wound  Date First Assessed/Time First Assessed: 12/21/12 1525   Wound Type: Contact dermatitis;Puncture  Location: Leg  Location Orientation: Left;Lateral  Site / Wound Assessment Clean;Pink;Red;Yellow  % Wound base Red or Granulating 95%  % Wound base Yellow 5%  % Wound base Black 0%  Drainage Amount Scant  Dressing Type Gauze (Comment);Silver hydrofiber;Tape dressing  Dressing Changed New  Dressing Status Clean;Dry;Intact  Selective Debridement  Selective Debridement - Location LLE  Selective Debridement - Tools Used Forceps  Selective Debridement - Tissue Removed Slough and dry skin  Wound Therapy - Assess/Plan/Recommendations  Wound Therapy - Clinical Statement Wound continue to approximate, no reports of pain and increased granulation.  No maceration this session.  Wound responding well to silver hydrofiber  Wound Plan Re-eval next session.    Selective Debridement Selective Debridement - Location: LLE Selective Debridement - Tools Used: Forceps Selective Debridement - Tissue Removed: Slough and dry skin   Physical Therapy Assessment and Plan Wound Therapy - Assess/Plan/Recommendations Wound Therapy - Clinical Statement: Wound continue to approximate, no reports of pain and increased granulation.  No maceration this session.  Wound responding well to silver hydrofiber Wound Plan: Re-eval next session.      Goals    Problem  List Patient Active Problem List  Diagnosis  . Anemia  . Hypertension  . High cholesterol  . CAD (coronary artery disease)  . Blood in stool  . COPD (chronic obstructive pulmonary disease)    GP    [name] 03/23/2013, 10:48 AM

## 2013-03-27 ENCOUNTER — Ambulatory Visit (HOSPITAL_COMMUNITY)
Admission: RE | Admit: 2013-03-27 | Discharge: 2013-03-27 | Disposition: A | Discharge status: Home / Self Care | Payer: Medicare Other | Source: Ambulatory Visit | Attending: Family Medicine | Admitting: Family Medicine

## 2013-03-27 DIAGNOSIS — IMO0001 Reserved for inherently not codable concepts without codable children: Secondary | ICD-10-CM | POA: Diagnosis not present

## 2013-03-27 DIAGNOSIS — I10 Essential (primary) hypertension: Secondary | ICD-10-CM | POA: Diagnosis not present

## 2013-03-27 DIAGNOSIS — S91109A Unspecified open wound of unspecified toe(s) without damage to nail, initial encounter: Secondary | ICD-10-CM | POA: Diagnosis not present

## 2013-03-27 NOTE — Progress Notes (Addendum)
Physical Therapy - Wound Therapy  Re-evaluation   Patient Details  Name: John Sampson MRN: [id number] Date of Birth: 1936/03/07  Today's Date: 03/27/2013 Time: 4098-1191 Time Calculation (min): 17 min  Visit#: 26 of 34  Re-eval: 03/16/13 Charges: Selective debridement (= or < 20 cm)   Subjective Subjective Assessment Subjective: Pt is pain free and contineus to be pleased to wound progress.  Pain Assessment Pain Assessment Pain Assessment: No/denies pain  Wound Therapy Wound 12/21/12 Contact dermatitis;Puncture Leg Left;Lateral (Active)  Site / Wound Assessment Clean;Pink;Red;Yellow 03/27/2013 10:45 AM  % Wound base Red or Granulating 95% 03/27/2013 10:45 AM  % Wound base Yellow 5% 03/27/2013 10:45 AM  % Wound base Black 0% 03/27/2013 10:45 AM  % Wound base Other (Comment) 0% 03/06/2013  4:22 PM  Peri-wound Assessment Erythema (blanchable) 03/19/2013 11:53 AM  Wound Length (cm) 1.7 cm 03/27/2013 10:45 AM  Wound Width (cm) 1.5 cm 03/27/2013 10:45 AM  Wound Depth (cm) 0 cm 03/27/2013 10:45 AM  Closure None 03/06/2013  4:22 PM  Drainage Amount Scant 03/27/2013 10:45 AM  Non-staged Wound Description Full thickness 01/08/2013 11:58 AM  Treatment Cleansed;Debridement (Selective) 03/27/2013 10:45 AM  Dressing Type Gauze (Comment);Impregnated gauze (bismuth) 03/27/2013 10:45 AM  Dressing Changed Changed 03/27/2013 10:45 AM  Dressing Status Clean;Dry;Intact 03/27/2013 10:45 AM   Selective Debridement Selective Debridement - Location: LLE Selective Debridement - Tools Used: Forceps Selective Debridement - Tissue Removed: Slough and dry skin   Physical Therapy Assessment and Plan Wound Therapy - Assess/Plan/Recommendations Wound Therapy - Clinical Statement: Wound continues to approximate well. Small areas below original wound are now completely healed. Changed dressing type to Xeroform as silver alginate was adhered to wound. Wound is without signs or sx of infection. Wound Plan: Recommend to  continue wound care 2 times a week x 2 weeks    Goals Wound Therapy Goals - Improve the function of patient's integumentary system by progressing the wound(s) through the phases of wound healing by: Decrease Necrotic Tissue to: 0 Decrease Necrotic Tissue - Progress: Progressing toward goal Increase Granulation Tissue to: 100 Increase Granulation Tissue - Progress: Progressing toward goal Decrease Length/Width/Depth by (cm): 1.5x1.5 Decrease Length/Width/Depth - Progress: Partly met (wound is 1.7x1.5) Improve Drainage Characteristics:  (none) Improve Drainage Characteristics - Progress: Progressing toward goal Patient/Family will be able to : educated in care and dressing changes  Patient/Family Instruction Goal - Progress: Progressing toward goal Time For Goal Achievement:  (3 weeks) Wound Therapy - Potential for Goals: Good  Problem List Patient Active Problem List   Diagnosis Date Noted  . COPD (chronic obstructive pulmonary disease) 01/23/2013  . Anemia 08/03/2012  . Hypertension 08/03/2012  . High cholesterol 08/03/2012  . CAD (coronary artery disease) 08/03/2012  . Blood in stool 08/03/2012   GP  Functional Limitation: Other PT primary  Other PT Primary Current Status (Y7829): At least 1 percent but less than 20 percent impaired, limited or restricted  Other PT Primary Goal Status (F6213): At least 1 percent but less than 20 percent impaired, limited or restricted  Seth Bake, PTA  03/27/2013, 10:54 AM

## 2013-03-30 ENCOUNTER — Ambulatory Visit (HOSPITAL_COMMUNITY)
Admission: RE | Admit: 2013-03-30 | Discharge: 2013-03-30 | Disposition: A | Discharge status: Home / Self Care | Payer: Medicare Other | Source: Ambulatory Visit | Attending: Family Medicine | Admitting: Family Medicine

## 2013-03-30 DIAGNOSIS — I10 Essential (primary) hypertension: Secondary | ICD-10-CM | POA: Diagnosis not present

## 2013-03-30 DIAGNOSIS — J449 Chronic obstructive pulmonary disease, unspecified: Secondary | ICD-10-CM | POA: Insufficient documentation

## 2013-03-30 DIAGNOSIS — IMO0001 Reserved for inherently not codable concepts without codable children: Secondary | ICD-10-CM | POA: Insufficient documentation

## 2013-03-30 DIAGNOSIS — J4489 Other specified chronic obstructive pulmonary disease: Secondary | ICD-10-CM | POA: Insufficient documentation

## 2013-03-30 DIAGNOSIS — I2510 Atherosclerotic heart disease of native coronary artery without angina pectoris: Secondary | ICD-10-CM | POA: Diagnosis not present

## 2013-03-30 DIAGNOSIS — S91109A Unspecified open wound of unspecified toe(s) without damage to nail, initial encounter: Secondary | ICD-10-CM | POA: Insufficient documentation

## 2013-03-30 NOTE — Progress Notes (Signed)
Physical Therapy - Wound Therapy  Treatment   Patient Details  Name: John Sampson MRN: [id number] Date of Birth: 16-May-1936  Today's Date: 03/30/2013 Time: 4098-1191 Time Calculation (min): 20 min  Visit#: 27 of 34  Re-eval: 04/27/13  Subjective Subjective Assessment Subjective: No pain, still unable to tolerate any compression. He is happy with his wound progress.   Pain Assessment Pain Assessment Pain Assessment: No/denies pain  Wound Therapy Wound 12/21/12 Contact dermatitis;Puncture Leg Left;Lateral (Active)  Site / Wound Assessment Clean;Pink;Red;Yellow 03/30/2013  1:03 PM  % Wound base Red or Granulating 95% 03/30/2013  1:03 PM  % Wound base Yellow 5% 03/30/2013  1:03 PM  % Wound base Black 0% 03/30/2013  1:03 PM  % Wound base Other (Comment) 0% 03/06/2013  4:22 PM  Peri-wound Assessment Erythema (blanchable) 03/19/2013 11:53 AM  Wound Length (cm) 1.7 cm 03/27/2013 10:45 AM  Wound Width (cm) 1.5 cm 03/27/2013 10:45 AM  Wound Depth (cm) 0 cm 03/27/2013 10:45 AM  Closure None 03/06/2013  4:22 PM  Drainage Amount Scant 03/30/2013  1:03 PM  Non-staged Wound Description Full thickness 01/08/2013 11:58 AM  Treatment Cleansed;Debridement (Selective) 03/27/2013 10:45 AM  Dressing Type Gauze (Comment);Impregnated gauze (bismuth) 03/30/2013  1:03 PM  Dressing Changed Changed 03/27/2013 10:45 AM  Dressing Status Clean;Dry;Intact 03/30/2013  1:03 PM     Wound 12/21/12 Puncture Toe (Comment  which one) Right great toe  (Active)  Site / Wound Assessment Granulation tissue 02/21/2013  3:56 PM  % Wound base Red or Granulating 100% 02/21/2013  3:56 PM  % Wound base Yellow 0% 02/21/2013  3:56 PM  % Wound base Black 0% 02/13/2013  4:42 PM  % Wound base Other (Comment) 0% 02/13/2013  4:42 PM  Peri-wound Assessment Edema;Erythema (blanchable) 01/08/2013 11:58 AM  Wound Length (cm) 0.1 cm 02/16/2013 11:00 AM  Wound Width (cm) 0.1 cm 02/16/2013 11:00 AM  Margins Attached edges (approximated) 12/25/2012  9:30 AM   Closure None 02/21/2013  3:56 PM  Drainage Amount None 02/21/2013  3:56 PM  Drainage Description Serous 02/13/2013  4:42 PM  Treatment Cleansed;Debridement (Selective) 02/21/2013  3:56 PM  Dressing Type Impregnated gauze (bismuth);Gauze (Comment) 02/21/2013  3:56 PM  Dressing Changed New 02/13/2013  4:42 PM  Dressing Status Clean;Dry;Intact 02/13/2013  4:42 PM     Incision 12/31/11 Face Other (Comment) (Active)     Incision 12/31/11 Abdomen Other (Comment) (Active)   Selective Debridement Selective Debridement - Location: LLE Selective Debridement - Tools Used: Forceps Selective Debridement - Tissue Removed: Slough and dry skin   Physical Therapy Assessment and Plan Wound Therapy - Assess/Plan/Recommendations Wound Therapy - Clinical Statement: Wound is shallow and is healing well.  Requires little debridement.  Continued with xeroform to promote healing.  Will consider D/C when wound becomes 100% granulated and smaller in order for daughter to care for.  Wound Plan: Recommend to continue wound care x 4 weeks or until wound is completely healed.      Goals    Problem List Patient Active Problem List   Diagnosis Date Noted  . COPD (chronic obstructive pulmonary disease) 01/23/2013  . Anemia 08/03/2012  . Hypertension 08/03/2012  . High cholesterol 08/03/2012  . CAD (coronary artery disease) 08/03/2012  . Blood in stool 08/03/2012    GP    Dakwon Wenberg, PT 03/30/2013, 1:05 PM

## 2013-04-03 ENCOUNTER — Ambulatory Visit (HOSPITAL_COMMUNITY)
Admission: RE | Admit: 2013-04-03 | Discharge: 2013-04-03 | Disposition: A | Discharge status: Home / Self Care | Payer: Medicare Other | Source: Ambulatory Visit | Attending: Family Medicine | Admitting: Family Medicine

## 2013-04-03 NOTE — Progress Notes (Signed)
Physical Therapy - Wound Therapy  Treatment   Patient Details  Name: John Sampson MRN: [id number] Date of Birth: Nov 06, 1936  Today's Date: 04/03/2013 Time: 3244-0102 Time Calculation (min): 15 min Charges: Selective debridement (= or < 20 cm)  Visit#: 28 of 34  Re-eval: 04/27/13  Subjective Subjective Assessment Subjective: Pt requests to only come 1xper week.  Pain Assessment Pain Assessment Pain Assessment: No/denies pain  Wound Therapy Wound 12/21/12 Contact dermatitis;Puncture Leg Left;Lateral (Active)  Site / Wound Assessment Clean;Pink;Red;Yellow 04/03/2013  4:35 PM  % Wound base Red or Granulating 95% 04/03/2013  4:35 PM  % Wound base Yellow 5% 04/03/2013  4:35 PM  % Wound base Black 0% 04/03/2013  4:35 PM  % Wound base Other (Comment) 0% 03/06/2013  4:22 PM  Peri-wound Assessment Erythema (blanchable) 03/19/2013 11:53 AM  Wound Length (cm) 1.7 cm 03/27/2013 10:45 AM  Wound Width (cm) 1.5 cm 03/27/2013 10:45 AM  Wound Depth (cm) 0 cm 03/27/2013 10:45 AM  Closure None 03/06/2013  4:22 PM  Drainage Amount Scant 04/03/2013  4:35 PM  Non-staged Wound Description Full thickness 01/08/2013 11:58 AM  Treatment Cleansed;Debridement (Selective) 04/03/2013  4:35 PM  Dressing Type Gauze (Comment);Impregnated gauze (bismuth) 04/03/2013  4:35 PM  Dressing Changed Changed 03/27/2013 10:45 AM  Dressing Status Clean;Dry;Intact 04/03/2013  4:35 PM     Wound 12/21/12 Puncture Toe (Comment  which one) Right great toe  (Active)  Site / Wound Assessment Granulation tissue 02/21/2013  3:56 PM  % Wound base Red or Granulating 100% 02/21/2013  3:56 PM  % Wound base Yellow 0% 02/21/2013  3:56 PM  % Wound base Black 0% 02/13/2013  4:42 PM  % Wound base Other (Comment) 0% 02/13/2013  4:42 PM  Peri-wound Assessment Edema;Erythema (blanchable) 01/08/2013 11:58 AM  Wound Length (cm) 0.1 cm 02/16/2013 11:00 AM  Wound Width (cm) 0.1 cm 02/16/2013 11:00 AM  Margins Attached edges (approximated) 12/25/2012  9:30 AM  Closure  None 02/21/2013  3:56 PM  Drainage Amount None 02/21/2013  3:56 PM  Drainage Description Serous 02/13/2013  4:42 PM  Treatment Cleansed;Debridement (Selective) 02/21/2013  3:56 PM  Dressing Type Impregnated gauze (bismuth);Gauze (Comment) 02/21/2013  3:56 PM  Dressing Changed New 02/13/2013  4:42 PM  Dressing Status Clean;Dry;Intact 02/13/2013  4:42 PM     Incision 12/31/11 Face Other (Comment) (Active)     Incision 12/31/11 Abdomen Other (Comment) (Active)   Selective Debridement Selective Debridement - Location: LLE Selective Debridement - Tools Used: Forceps Selective Debridement - Tissue Removed: Slough and dry skin   Physical Therapy Assessment and Plan Wound Therapy - Assess/Plan/Recommendations Wound Therapy - Clinical Statement: Wound continues to progress well. Pt tolerates debridement well. Continued xeroform dressing as wound appears to be responding well to it. Vaseline applied to periwound to protect skin integrity.  Wound Plan: Recommend decreasing frequency to 1xper week.  Problem List Patient Active Problem List   Diagnosis Date Noted  . COPD (chronic obstructive pulmonary disease) 01/23/2013  . Anemia 08/03/2012  . Hypertension 08/03/2012  . High cholesterol 08/03/2012  . CAD (coronary artery disease) 08/03/2012  . Blood in stool 08/03/2012   Seth Bake, PTA 04/03/2013, 4:39 PM

## 2013-04-10 ENCOUNTER — Ambulatory Visit (HOSPITAL_COMMUNITY)
Admission: RE | Admit: 2013-04-10 | Discharge: 2013-04-10 | Disposition: A | Discharge status: Home / Self Care | Payer: Medicare Other | Source: Ambulatory Visit | Attending: Family Medicine | Admitting: Family Medicine

## 2013-04-10 NOTE — Progress Notes (Addendum)
Physical Therapy - Wound Therapy  Treatment/Discharge.  charge:  Self care x 20 Patient Details  Name: John Sampson MRN: [id number] Date of Birth: December 15, 1935  Today's Date: 04/10/2013 Time: 4098-1191 Time Calculation (min): 25 min  Visit#: 29 of 30   Subjective Subjective Assessment Subjective: Pt states that he has had no pain for the past three weeks. Patient and Family Stated Goals: For his wounds to heal. Date of Onset: 12/07/12  Pain Assessment Pain Assessment Pain Assessment: No/denies pain  Wound Therapy Wound 12/21/12 Contact dermatitis;Puncture Leg Left;Lateral (Active)  Site / Wound Assessment Granulation tissue was black 04/10/2013  9:39 AM  % Wound base Red or Granulating 100% was 0% 04/10/2013  9:39 AM  % Wound base Yellow 0% 04/10/2013  9:39 AM  % Wound base Black 0% 04/03/2013  4:35 PM  % Wound base Other (Comment) 0% 03/06/2013  4:22 PM  Peri-wound Assessment Intact 04/10/2013  9:39 AM  Wound Length (cm) 1 cm was 2,4  04/10/2013  9:39 AM  Wound Width (cm) 1 cm was 2.2 04/10/2013  9:39 AM  Wound Depth (cm) 0 cm was unknown 04/10/2013  9:39 AM  Closure None 04/10/2013  9:39 AM  Drainage Amount Scant 04/10/2013  9:39 AM  Drainage Description Serous 04/10/2013  9:39 AM  Non-staged Wound Description Partial thickness 04/10/2013  9:39 AM  Treatment Cleansed 04/10/2013  9:39 AM  Dressing Type Gauze (Comment);Impregnated gauze (bismuth) 04/03/2013  4:35 PM  Dressing Changed Changed 04/10/2013  9:39 AM  Dressing Status Clean 04/10/2013  9:39 AM     Wound 12/21/12 Puncture Toe (Comment  which one) Right great toe  (Active)  Site / Wound Assessment Granulation tissue 02/21/2013  3:56 PM  % Wound base Red or Granulating 100% 02/21/2013  3:56 PM  % Wound base Yellow 0% 02/21/2013  3:56 PM  % Wound base Black 0% 02/13/2013  4:42 PM  % Wound base Other (Comment) 0% 02/13/2013  4:42 PM  Peri-wound Assessment Edema;Erythema (blanchable) 01/08/2013 11:58 AM  Wound Length (cm) 0.1 cm 02/16/2013  11:00 AM  Wound Width (cm) 0.1 cm 02/16/2013 11:00 AM  Margins Attached edges (approximated) 12/25/2012  9:30 AM  Closure None 02/21/2013  3:56 PM  Drainage Amount None 02/21/2013  3:56 PM  Drainage Description Serous 02/13/2013  4:42 PM  Treatment Cleansed;Debridement (Selective) 02/21/2013  3:56 PM  Dressing Type Impregnated gauze (bismuth);Gauze (Comment) 02/21/2013  3:56 PM  Dressing Changed New 02/13/2013  4:42 PM  Dressing Status Clean;Dry;Intact 02/13/2013  4:42 PM     Incision 12/31/11 Face Other (Comment) (Active)     Incision 12/31/11 Abdomen Other (Comment) (Active)       Physical Therapy Assessment and Plan Wound Therapy - Assess/Plan/Recommendations Wound Therapy - Clinical Statement: Wound no longer needs skilled care. Spoke to both pt and daughter about self care at home.  Both pt and daughter are agreeable and feel they will be able to take care of the wound on their own. Wound Plan: discharge Patient to home self care.  Pt verbalizes understanding of how to take care of wound.      Goals Wound Therapy Goals - Improve the function of patient's integumentary system by progressing the wound(s) through the phases of wound healing by: Decrease Necrotic Tissue to: 0 Decrease Necrotic Tissue - Progress: Met Increase Granulation Tissue to: 100 Increase Granulation Tissue - Progress: Met Decrease Length/Width/Depth by (cm): 1.5x1.5 Decrease Length/Width/Depth - Progress: Met Improve Drainage Characteristics:  (scant) Improve Drainage Characteristics - Progress: Met Patient/Family  will be able to : educated in care and dressing changes  Patient/Family Instruction Goal - Progress: Met  Problem List Patient Active Problem List   Diagnosis Date Noted  . COPD (chronic obstructive pulmonary disease) 01/23/2013  . Anemia 08/03/2012  . Hypertension 08/03/2012  . High cholesterol 08/03/2012  . CAD (coronary artery disease) 08/03/2012  . Blood in stool 08/03/2012    GP Functional  Assessment Tool Used: clinical judgement Other PT Primary Current Status [phone]): At least 1 percent but less than 20 percent impaired, limited or restricted Other PT Primary Goal Status (U0454): At least 1 percent but less than 20 percent impaired, limited or restricted  RUSSELL,CINDY 04/10/2013, 9:44 AM

## 2013-04-25 ENCOUNTER — Ambulatory Visit (HOSPITAL_COMMUNITY)
Admission: RE | Admit: 2013-04-25 | Discharge: 2013-04-25 | Disposition: A | Discharge status: Home / Self Care | Payer: Medicare Other | Source: Ambulatory Visit | Attending: Family Medicine | Admitting: Family Medicine

## 2013-04-25 NOTE — Progress Notes (Signed)
Physical Therapy - Wound Therapy  RE-Evaluation   Patient Details  Name: John Sampson MRN: [id number] Date of Birth: 23-Jun-1936  Today's Date: 04/25/2013 Time: 1020-1050 Time Calculation (min): 30 min Charges: 1 re-evaluation, Deb < 20 cm Visit#: 1 of 8  Re-eval: 05/25/13  Subjective Subjective Assessment Subjective: Pt and daughter state that he was d/c on 5/13 and has been applying neosporin on his wound and it is now red and inflammed.  Denies pain.   Pain Assessment Pain Assessment Pain Assessment: No/denies pain  Wound Therapy Wound 12/21/12 Contact dermatitis;Puncture Leg Left;Lateral (Active)  Site / Wound Assessment Granulation tissue 04/25/2013 12:03 PM  % Wound base Red or Granulating 100% 04/25/2013 12:03 PM  % Wound base Yellow 0% 04/25/2013 12:03 PM  % Wound base Black 0% 04/03/2013  4:35 PM  % Wound base Other (Comment) 0% 03/06/2013  4:22 PM  Peri-wound Assessment Intact 04/25/2013 12:03 PM  Wound Length (cm) 1.8 cm 04/25/2013 12:03 PM  Wound Width (cm) 2.3 cm 04/25/2013 12:03 PM  Wound Depth (cm) 0 cm 04/10/2013  9:39 AM  Closure None 04/25/2013 12:03 PM  Drainage Amount Scant 04/25/2013 12:03 PM  Drainage Description Serous 04/25/2013 12:03 PM  Non-staged Wound Description Partial thickness 04/25/2013 12:03 PM  Treatment Cleansed 04/10/2013  9:39 AM  Dressing Type Hydrogel;Gauze (Comment);Moist to moist 04/25/2013 12:03 PM  Dressing Changed New 04/25/2013 12:03 PM  Dressing Status Clean 04/25/2013 12:03 PM     Wound 12/21/12 Puncture Toe (Comment  which one) Right great toe  (Active)  Site / Wound Assessment Granulation tissue 02/21/2013  3:56 PM  % Wound base Red or Granulating 100% 02/21/2013  3:56 PM  % Wound base Yellow 0% 02/21/2013  3:56 PM  % Wound base Black 0% 02/13/2013  4:42 PM  % Wound base Other (Comment) 0% 02/13/2013  4:42 PM  Peri-wound Assessment Edema;Erythema (blanchable) 01/08/2013 11:58 AM  Wound Length (cm) 0.1 cm 02/16/2013 11:00 AM  Wound Width (cm)  0.1 cm 02/16/2013 11:00 AM  Margins Attached edges (approximated) 12/25/2012  9:30 AM  Closure None 02/21/2013  3:56 PM  Drainage Amount None 02/21/2013  3:56 PM  Drainage Description Serous 02/13/2013  4:42 PM  Treatment Cleansed;Debridement (Selective) 02/21/2013  3:56 PM  Dressing Type Impregnated gauze (bismuth);Gauze (Comment) 02/21/2013  3:56 PM  Dressing Changed New 02/13/2013  4:42 PM  Dressing Status Clean;Dry;Intact 02/13/2013  4:42 PM     Incision 12/31/11 Face Other (Comment) (Active)     Incision 12/31/11 Abdomen Other (Comment) (Active)   Selective Debridement Selective Debridement - Location: LLE anterior shin Selective Debridement - Tools Used: Forceps Selective Debridement - Tissue Removed: Dry drainage on top of wound.    Physical Therapy Assessment and Plan Wound Therapy - Assess/Plan/Recommendations Wound Therapy - Clinical Statement: Pt requried debridment of wound, traced outline of redness.  Will follow up with patient on Friday to assess the redness.  If redness has increased then will refer to PCP/ED if necessary, if wound redness is decreased will suggest to continue using hydrogel for dressing and using gauze.  Hydrotherapy Plan: Debridement;Dressing change;Patient/family education;Pulsatile lavage with suction Wound Plan: f/u with outlined redness to leg.  If larger suggest f/u w/PCP and ED.  If it has decreased, educate on use of hydrogel vs. vasoline.       Goals Wound Therapy Goals - Improve the function of patient's integumentary system by progressing the wound(s) through the phases of wound healing by: Decrease Necrotic Tissue to: 0 Decrease Necrotic Tissue -  Progress: Met Increase Granulation Tissue to: 100 Increase Granulation Tissue - Progress: Met Decrease Length/Width/Depth by (cm): 1.5x1.5 Decrease Length/Width/Depth - Progress: Progressing toward goal Improve Drainage Characteristics:  (scant) Improve Drainage Characteristics - Progress:  Met Patient/Family will be able to : educated in care and dressing changes   Problem List Patient Active Problem List   Diagnosis Date Noted  . COPD (chronic obstructive pulmonary disease) 01/23/2013  . Anemia 08/03/2012  . Hypertension 08/03/2012  . High cholesterol 08/03/2012  . CAD (coronary artery disease) 08/03/2012  . Blood in stool 08/03/2012   GP Functional Assessment Tool Used: clinical judgement Functional Limitation: Other PT primary Other PT Primary Current Status (W0981): At least 1 percent but less than 20 percent impaired, limited or restricted Other PT Primary Goal Status (X9147): 0 percent impaired, limited or restricted  Ramiro Pangilinan, MPT, ATC 04/25/2013, 12:12 PM

## 2013-04-27 ENCOUNTER — Ambulatory Visit (HOSPITAL_COMMUNITY)
Admission: RE | Admit: 2013-04-27 | Discharge: 2013-04-27 | Disposition: A | Discharge status: Home / Self Care | Payer: Medicare Other | Source: Ambulatory Visit | Attending: Family Medicine | Admitting: Family Medicine

## 2013-04-27 NOTE — Progress Notes (Signed)
Physical Therapy - Wound Therapy  Treatment   Patient Details  Name: John Sampson MRN: [id number] Date of Birth: Jun 18, 1936  Today's Date: 04/27/2013 Time: 0803-0820 Time Calculation (min): 17 min Charge: selective debridement <20 cm  Visit#: 2 of 8  Re-eval: 05/25/13  Subjective Subjective Assessment Subjective: No pain today.  Pain Assessment Pain Assessment Pain Assessment: No/denies pain  Wound Therapy  04/27/13 0827  Subjective Assessment  Subjective No pain today.  Wound  Date First Assessed/Time First Assessed: 12/21/12 1525   Wound Type: Contact dermatitis;Puncture  Location: Leg  Location Orientation: Left;Lateral  Site / Wound Assessment Granulation tissue  % Wound base Red or Granulating 100%  % Wound base Yellow 0%  Drainage Amount Scant  Drainage Description Serous  Non-staged Wound Description Partial thickness  Dressing Type Hydrogel;Gauze (Comment);Moist to moist  Dressing Changed New  Dressing Status Clean  Wound Therapy - Assess/Plan/Recommendations  Wound Therapy - Clinical Statement No redness beyond traced border and decreased overall redness.  Continued dressings of moist to moist with hydrogel and gauze.  Wound Plan Continue with current POC.   Physical Therapy Assessment and Plan Wound Therapy - Assess/Plan/Recommendations Wound Therapy - Clinical Statement: No redness beyond traced border and decreased overall redness.  Continued dressings of moist to moist with hydrogel and gauze. Wound Plan: Continue with current POC.      Goals    Problem List Patient Active Problem List   Diagnosis Date Noted  . COPD (chronic obstructive pulmonary disease) 01/23/2013  . Anemia 08/03/2012  . Hypertension 08/03/2012  . High cholesterol 08/03/2012  . CAD (coronary artery disease) 08/03/2012  . Blood in stool 08/03/2012    GP    [name] 04/27/2013, 8:53 AM

## 2013-05-01 ENCOUNTER — Ambulatory Visit (HOSPITAL_COMMUNITY)
Admission: RE | Admit: 2013-05-01 | Discharge: 2013-05-01 | Disposition: A | Discharge status: Home / Self Care | Payer: Medicare Other | Source: Ambulatory Visit | Attending: Family Medicine | Admitting: Family Medicine

## 2013-05-01 DIAGNOSIS — S91109A Unspecified open wound of unspecified toe(s) without damage to nail, initial encounter: Secondary | ICD-10-CM | POA: Diagnosis not present

## 2013-05-01 DIAGNOSIS — IMO0001 Reserved for inherently not codable concepts without codable children: Secondary | ICD-10-CM | POA: Diagnosis not present

## 2013-05-01 DIAGNOSIS — I10 Essential (primary) hypertension: Secondary | ICD-10-CM | POA: Diagnosis not present

## 2013-05-01 NOTE — Progress Notes (Signed)
Physical Therapy - Wound Therapy  Treatment   Patient Details  Name: John Sampson MRN: [id number] Date of Birth: 1936-03-06  Today's Date: 05/01/2013 Time: 4098-1191 Time Calculation (min): 25 min Charges: Selective debridement (= or < 20 cm)   Visit#: 3 of 8  Re-eval: 05/25/13  Subjective Subjective Assessment Subjective: Pt I pain free and without complaint.  Pain Assessment Pain Assessment Pain Assessment: No/denies pain  Wound Therapy Wound 12/21/12 Contact dermatitis;Puncture Leg Left;Lateral (Active)  Site / Wound Assessment Granulation tissue 05/01/2013 11:23 AM  % Wound base Red or Granulating 100% 05/01/2013 11:23 AM  % Wound base Yellow 0% 05/01/2013 11:23 AM  % Wound base Black 0% 04/03/2013  4:35 PM  % Wound base Other (Comment) 0% 03/06/2013  4:22 PM  Peri-wound Assessment Intact 04/25/2013 12:03 PM  Wound Length (cm) 1.8 cm 04/25/2013 12:03 PM  Wound Width (cm) 2.3 cm 04/25/2013 12:03 PM  Wound Depth (cm) 0 cm 04/10/2013  9:39 AM  Closure None 04/25/2013 12:03 PM  Drainage Amount Scant 05/01/2013 11:23 AM  Drainage Description Serous 05/01/2013 11:23 AM  Non-staged Wound Description Partial thickness 05/01/2013 11:23 AM  Treatment Cleansed;Debridement (Selective) 05/01/2013 11:23 AM  Dressing Type Hydrogel;Gauze (Comment);Moist to moist 05/01/2013 11:23 AM  Dressing Changed Changed 05/01/2013 11:23 AM  Dressing Status Clean 05/01/2013 11:23 AM     Wound 12/21/12 Puncture Toe (Comment  which one) Right great toe  (Active)  Site / Wound Assessment Granulation tissue 02/21/2013  3:56 PM  % Wound base Red or Granulating 100% 02/21/2013  3:56 PM  % Wound base Yellow 0% 02/21/2013  3:56 PM  % Wound base Black 0% 02/13/2013  4:42 PM  % Wound base Other (Comment) 0% 02/13/2013  4:42 PM  Peri-wound Assessment Edema;Erythema (blanchable) 01/08/2013 11:58 AM  Wound Length (cm) 0.1 cm 02/16/2013 11:00 AM  Wound Width (cm) 0.1 cm 02/16/2013 11:00 AM  Margins Attached edges (approximated) 12/25/2012   9:30 AM  Closure None 02/21/2013  3:56 PM  Drainage Amount None 02/21/2013  3:56 PM  Drainage Description Serous 02/13/2013  4:42 PM  Treatment Cleansed;Debridement (Selective) 02/21/2013  3:56 PM  Dressing Type Impregnated gauze (bismuth);Gauze (Comment) 02/21/2013  3:56 PM  Dressing Changed New 02/13/2013  4:42 PM  Dressing Status Clean;Dry;Intact 02/13/2013  4:42 PM     Incision 12/31/11 Face Other (Comment) (Active)     Incision 12/31/11 Abdomen Other (Comment) (Active)   Selective Debridement Selective Debridement - Location: LLE anterior shin Selective Debridement - Tools Used: Forceps Selective Debridement - Tissue Removed: Dry skin/drainage   Physical Therapy Assessment and Plan Wound Therapy - Assess/Plan/Recommendations Wound Therapy - Clinical Statement: Erythema has greatly decreased. Pt is pain free. Dry skin/drainage was removed from wound and periwound. Vaseline war applied to perimeter and moist to moist dressing applied to wound.  Wound Plan: Continue with current POC.    Problem List Patient Active Problem List   Diagnosis Date Noted  . COPD (chronic obstructive pulmonary disease) 01/23/2013  . Anemia 08/03/2012  . Hypertension 08/03/2012  . High cholesterol 08/03/2012  . CAD (coronary artery disease) 08/03/2012  . Blood in stool 08/03/2012     Seth Bake, PTA' 05/01/2013, 11:35 AM

## 2013-05-04 ENCOUNTER — Ambulatory Visit (HOSPITAL_COMMUNITY)
Admission: RE | Admit: 2013-05-04 | Discharge: 2013-05-04 | Disposition: A | Discharge status: Home / Self Care | Payer: Medicare Other | Source: Ambulatory Visit | Attending: Family Medicine | Admitting: Family Medicine

## 2013-05-04 DIAGNOSIS — S91109A Unspecified open wound of unspecified toe(s) without damage to nail, initial encounter: Secondary | ICD-10-CM | POA: Diagnosis not present

## 2013-05-04 DIAGNOSIS — IMO0001 Reserved for inherently not codable concepts without codable children: Secondary | ICD-10-CM | POA: Diagnosis not present

## 2013-05-04 DIAGNOSIS — I10 Essential (primary) hypertension: Secondary | ICD-10-CM | POA: Diagnosis not present

## 2013-05-04 NOTE — Progress Notes (Signed)
Physical Therapy - Wound Therapy  Treatment   Patient Details  Name: John Sampson MRN: [id number] Date of Birth: June 22, 1936  Today's Date: 05/04/2013 Time: 4098-1191 Time Calculation (min): 25 min  Visit#: 4 of 8  Re-eval: 05/25/13 Charges: Selective debridement (= or < 20 cm)   Subjective Subjective Assessment Subjective: Pt is without complaint.  Pain Assessment Pain Assessment Pain Assessment: No/denies pain  Wound Therapy Wound 12/21/12 Contact dermatitis;Puncture Leg Left;Lateral (Active)  Site / Wound Assessment Granulation tissue 05/04/2013 12:07 PM  % Wound base Red or Granulating 100% 05/04/2013 12:07 PM  % Wound base Yellow 0% 05/04/2013 12:07 PM  % Wound base Black 0% 04/03/2013  4:35 PM  % Wound base Other (Comment) 0% 03/06/2013  4:22 PM  Peri-wound Assessment Intact 04/25/2013 12:03 PM  Wound Length (cm) 1.8 cm 04/25/2013 12:03 PM  Wound Width (cm) 2.3 cm 04/25/2013 12:03 PM  Wound Depth (cm) 0 cm 04/10/2013  9:39 AM  Closure None 04/25/2013 12:03 PM  Drainage Amount Scant 05/04/2013 12:07 PM  Drainage Description Serous 05/04/2013 12:07 PM  Non-staged Wound Description Partial thickness 05/04/2013 12:07 PM  Treatment Cleansed;Debridement (Selective) 05/04/2013 12:07 PM  Dressing Type Hydrogel;Gauze (Comment);Moist to moist 05/04/2013 12:07 PM  Dressing Changed Changed 05/04/2013 12:07 PM  Dressing Status Clean 05/04/2013 12:07 PM     Wound 12/21/12 Puncture Toe (Comment  which one) Right great toe  (Active)  Site / Wound Assessment Granulation tissue 02/21/2013  3:56 PM  % Wound base Red or Granulating 100% 02/21/2013  3:56 PM  % Wound base Yellow 0% 02/21/2013  3:56 PM  % Wound base Black 0% 02/13/2013  4:42 PM  % Wound base Other (Comment) 0% 02/13/2013  4:42 PM  Peri-wound Assessment Edema;Erythema (blanchable) 01/08/2013 11:58 AM  Wound Length (cm) 0.1 cm 02/16/2013 11:00 AM  Wound Width (cm) 0.1 cm 02/16/2013 11:00 AM  Margins Attached edges (approximated) 12/25/2012  9:30 AM   Closure None 02/21/2013  3:56 PM  Drainage Amount None 02/21/2013  3:56 PM  Drainage Description Serous 02/13/2013  4:42 PM  Treatment Cleansed;Debridement (Selective) 02/21/2013  3:56 PM  Dressing Type Impregnated gauze (bismuth);Gauze (Comment) 02/21/2013  3:56 PM  Dressing Changed New 02/13/2013  4:42 PM  Dressing Status Clean;Dry;Intact 02/13/2013  4:42 PM     Incision 12/31/11 Face Other (Comment) (Active)     Incision 12/31/11 Abdomen Other (Comment) (Active)   Selective Debridement Selective Debridement - Location: LLE anterior shin Selective Debridement - Tools Used: Forceps Selective Debridement - Tissue Removed: Dry skin/drainage   Physical Therapy Assessment and Plan Wound Therapy - Assess/Plan/Recommendations Wound Therapy - Clinical Statement: Pt tolerates debridement well. Erythema continues to decrease. Vaseline applied to periwound. Wound appears healthy. Continued with moist to moist dressing. Wound Plan: Continue with current POC.  Problem List Patient Active Problem List   Diagnosis Date Noted  . COPD (chronic obstructive pulmonary disease) 01/23/2013  . Anemia 08/03/2012  . Hypertension 08/03/2012  . High cholesterol 08/03/2012  . CAD (coronary artery disease) 08/03/2012  . Blood in stool 08/03/2012    Seth Bake, PTA  05/04/2013, 12:13 PM

## 2013-05-08 ENCOUNTER — Ambulatory Visit (HOSPITAL_COMMUNITY)
Admission: RE | Admit: 2013-05-08 | Discharge: 2013-05-08 | Disposition: A | Discharge status: Home / Self Care | Payer: Medicare Other | Source: Ambulatory Visit | Attending: Family Medicine | Admitting: Family Medicine

## 2013-05-08 DIAGNOSIS — S91109A Unspecified open wound of unspecified toe(s) without damage to nail, initial encounter: Secondary | ICD-10-CM | POA: Diagnosis not present

## 2013-05-08 DIAGNOSIS — I10 Essential (primary) hypertension: Secondary | ICD-10-CM | POA: Diagnosis not present

## 2013-05-08 DIAGNOSIS — IMO0001 Reserved for inherently not codable concepts without codable children: Secondary | ICD-10-CM | POA: Diagnosis not present

## 2013-05-08 NOTE — Progress Notes (Signed)
Physical Therapy - Wound Therapy  Treatment   Patient Details  Name: John Sampson MRN: [id number] Date of Birth: 12-29-1935  Today's Date: 05/08/2013 Time: 2841-3244 Time Calculation (min): 15 min Charge  Selective debridement <20cm  Visit#: 5 of 8  Re-eval: 05/25/13  Subjective Subjective Assessment Subjective: No pain today  Pain Assessment Pain Assessment Pain Assessment: No/denies pain  Wound Therapy  05/08/13 1052  Subjective Assessment  Subjective No pain today  Wound  Date First Assessed/Time First Assessed: 12/21/12 1525   Wound Type: Contact dermatitis;Puncture  Location: Leg  Location Orientation: Left;Lateral  Site / Wound Assessment Granulation tissue  % Wound base Red or Granulating 100%  % Wound base Yellow 0%  Drainage Amount Scant  Drainage Description Serous  Non-staged Wound Description Partial thickness  Treatment Cleansed;Debridement (Selective)  Dressing Type Hydrogel;Gauze (Comment);Moist to moist  Dressing Changed New  Dressing Status Clean;Dry;Intact  Selective Debridement  Selective Debridement - Location LLE anterior shin  Selective Debridement - Tools Used Forceps  Selective Debridement - Tissue Removed Dry skin/drainage  Wound Therapy - Assess/Plan/Recommendations  Wound Therapy - Clinical Statement Wound continues to improve in granulation tissue, decreased erythema and decreasing in overall size. Debridement for removal of dry skin and slough, continued moist to moist dressings with gauze.  Wound Plan Continue with current POC.     Selective Debridement Selective Debridement - Location: LLE anterior shin Selective Debridement - Tools Used: Forceps Selective Debridement - Tissue Removed: Dry skin/drainage   Physical Therapy Assessment and Plan Wound Therapy - Assess/Plan/Recommendations Wound Therapy - Clinical Statement: Wound continues to improve in granulation tissue, decreased erythema and decreasing in overall size.  Wound  Plan: Continue with current POC.      Goals    Problem List Patient Active Problem List   Diagnosis Date Noted  . COPD (chronic obstructive pulmonary disease) 01/23/2013  . Anemia 08/03/2012  . Hypertension 08/03/2012  . High cholesterol 08/03/2012  . CAD (coronary artery disease) 08/03/2012  . Blood in stool 08/03/2012    GP    [name] 05/08/2013, 10:56 AM

## 2013-05-09 DIAGNOSIS — H2510 Age-related nuclear cataract, unspecified eye: Secondary | ICD-10-CM | POA: Diagnosis not present

## 2013-05-11 ENCOUNTER — Ambulatory Visit (HOSPITAL_COMMUNITY)
Admission: RE | Admit: 2013-05-11 | Discharge: 2013-05-11 | Disposition: A | Discharge status: Home / Self Care | Payer: Medicare Other | Source: Ambulatory Visit | Attending: Family Medicine | Admitting: Family Medicine

## 2013-05-11 DIAGNOSIS — I10 Essential (primary) hypertension: Secondary | ICD-10-CM | POA: Diagnosis not present

## 2013-05-11 DIAGNOSIS — IMO0001 Reserved for inherently not codable concepts without codable children: Secondary | ICD-10-CM | POA: Diagnosis not present

## 2013-05-11 DIAGNOSIS — S91109A Unspecified open wound of unspecified toe(s) without damage to nail, initial encounter: Secondary | ICD-10-CM | POA: Diagnosis not present

## 2013-05-11 NOTE — Progress Notes (Signed)
Physical Therapy - Wound Therapy  Treatment   Patient Details  Name: John Sampson MRN: [id number] Date of Birth: 04/26/1936  Today's Date: 05/11/2013 Time: 4098-1191 Time Calculation (min): 20 min Charges: Selective debridement (= or < 20 cm)   Visit#: 6 of 8  Re-eval: 05/25/13  Subjective Subjective Assessment Subjective: Pt is pain free and without complaint.  Pain Assessment Pain Assessment Pain Assessment: No/denies pain  Wound Therapy Wound 12/21/12 Contact dermatitis;Puncture Leg Left;Lateral (Active)  Site / Wound Assessment Granulation tissue 05/11/2013 10:44 AM  % Wound base Red or Granulating 100% 05/11/2013 10:44 AM  % Wound base Yellow 0% 05/11/2013 10:44 AM  % Wound base Black 0% 04/03/2013  4:35 PM  % Wound base Other (Comment) 0% 03/06/2013  4:22 PM  Peri-wound Assessment Intact 04/25/2013 12:03 PM  Wound Length (cm) 1.8 cm 04/25/2013 12:03 PM  Wound Width (cm) 2.3 cm 04/25/2013 12:03 PM  Wound Depth (cm) 0 cm 04/10/2013  9:39 AM  Closure None 04/25/2013 12:03 PM  Drainage Amount Scant 05/11/2013 10:44 AM  Drainage Description Serous 05/11/2013 10:44 AM  Non-staged Wound Description Partial thickness 05/11/2013 10:44 AM  Treatment Cleansed;Debridement (Selective) 05/11/2013 10:44 AM  Dressing Type Hydrogel;Gauze (Comment);Moist to moist 05/11/2013 10:44 AM  Dressing Changed New 05/11/2013 10:44 AM  Dressing Status Clean;Dry;Intact 05/11/2013 10:44 AM     Wound 12/21/12 Puncture Toe (Comment  which one) Right great toe  (Active)  Site / Wound Assessment Granulation tissue 02/21/2013  3:56 PM  % Wound base Red or Granulating 100% 02/21/2013  3:56 PM  % Wound base Yellow 0% 02/21/2013  3:56 PM  % Wound base Black 0% 02/13/2013  4:42 PM  % Wound base Other (Comment) 0% 02/13/2013  4:42 PM  Peri-wound Assessment Edema;Erythema (blanchable) 01/08/2013 11:58 AM  Wound Length (cm) 0.1 cm 02/16/2013 11:00 AM  Wound Width (cm) 0.1 cm 02/16/2013 11:00 AM  Margins Attached edges  (approximated) 12/25/2012  9:30 AM  Closure None 02/21/2013  3:56 PM  Drainage Amount None 02/21/2013  3:56 PM  Drainage Description Serous 02/13/2013  4:42 PM  Treatment Cleansed;Debridement (Selective) 02/21/2013  3:56 PM  Dressing Type Impregnated gauze (bismuth);Gauze (Comment) 02/21/2013  3:56 PM  Dressing Changed New 02/13/2013  4:42 PM  Dressing Status Clean;Dry;Intact 02/13/2013  4:42 PM     Incision 12/31/11 Face Other (Comment) (Active)     Incision 12/31/11 Abdomen Other (Comment) (Active)   Selective Debridement Selective Debridement - Location: LLE anterior shin Selective Debridement - Tools Used: Forceps Selective Debridement - Tissue Removed: Dry skin/drainage   Physical Therapy Assessment and Plan Wound Therapy - Assess/Plan/Recommendations Wound Therapy - Clinical Statement: Wound is approximating well. Debrided dry skin/drainage at wound perimeter. Pt tolerates debridement well and is without complaint throughout session. Continued with moist to moist dressing. Vaseline applied to periwound.  Wound Plan: Continue wound care per PT POC.  Problem List Patient Active Problem List   Diagnosis Date Noted  . COPD (chronic obstructive pulmonary disease) 01/23/2013  . Anemia 08/03/2012  . Hypertension 08/03/2012  . High cholesterol 08/03/2012  . CAD (coronary artery disease) 08/03/2012  . Blood in stool 08/03/2012    Seth Bake, PTA  05/11/2013, 10:47 AM

## 2013-05-16 ENCOUNTER — Ambulatory Visit (HOSPITAL_COMMUNITY)
Admission: RE | Admit: 2013-05-16 | Discharge: 2013-05-16 | Disposition: A | Discharge status: Home / Self Care | Payer: Medicare Other | Source: Ambulatory Visit | Attending: Family Medicine | Admitting: Family Medicine

## 2013-05-16 DIAGNOSIS — I10 Essential (primary) hypertension: Secondary | ICD-10-CM | POA: Diagnosis not present

## 2013-05-16 DIAGNOSIS — S91109A Unspecified open wound of unspecified toe(s) without damage to nail, initial encounter: Secondary | ICD-10-CM | POA: Diagnosis not present

## 2013-05-16 DIAGNOSIS — IMO0001 Reserved for inherently not codable concepts without codable children: Secondary | ICD-10-CM | POA: Diagnosis not present

## 2013-05-21 ENCOUNTER — Ambulatory Visit (HOSPITAL_COMMUNITY)
Admission: RE | Admit: 2013-05-21 | Discharge: 2013-05-21 | Disposition: A | Discharge status: Home / Self Care | Payer: Medicare Other | Source: Ambulatory Visit | Attending: Family Medicine | Admitting: Family Medicine

## 2013-05-21 DIAGNOSIS — S91109A Unspecified open wound of unspecified toe(s) without damage to nail, initial encounter: Secondary | ICD-10-CM | POA: Diagnosis not present

## 2013-05-21 DIAGNOSIS — I10 Essential (primary) hypertension: Secondary | ICD-10-CM | POA: Diagnosis not present

## 2013-05-21 DIAGNOSIS — IMO0001 Reserved for inherently not codable concepts without codable children: Secondary | ICD-10-CM | POA: Diagnosis not present

## 2013-05-21 NOTE — Progress Notes (Signed)
Physical Therapy - Wound Therapy  Treatment   Patient Details  Name: John Sampson MRN: [id number] Date of Birth: [birth year]  Today's Date: 05/21/2013 Time: 1045-1100 Time Calculation (min): 15 min Charges: Self Care: 1045-1100 Visit#: 7 of 8  Re-eval: 05/25/13  Subjective Subjective Assessment Subjective: My daugther is moving to [place] on July 4th.  I'm going to be here still.  My son will still be in [place].   Pain Assessment Pain Assessment Pain Assessment: No/denies pain  Wound Therapy Wound 12/21/12 Contact dermatitis;Puncture Leg Left;Lateral (Active)  Site / Wound Assessment Granulation tissue 05/21/2013 11:12 AM  % Wound base Red or Granulating 100% 05/21/2013 11:12 AM  % Wound base Yellow 0% 05/21/2013 11:12 AM  % Wound base Black 0% 04/03/2013  4:35 PM  % Wound base Other (Comment) 0% 03/06/2013  4:22 PM  Peri-wound Assessment Intact 04/25/2013 12:03 PM  Wound Length (cm) 1.8 cm 04/25/2013 12:03 PM  Wound Width (cm) 2.3 cm 04/25/2013 12:03 PM  Wound Depth (cm) 0 cm 04/10/2013  9:39 AM  Closure None 04/25/2013 12:03 PM  Drainage Amount Scant 05/21/2013 11:12 AM  Drainage Description Serous 05/21/2013 11:12 AM  Non-staged Wound Description Partial thickness 05/21/2013 11:12 AM  Treatment Cleansed 05/21/2013 11:12 AM  Dressing Type Gauze (Comment) 05/21/2013 11:12 AM  Dressing Changed New 05/21/2013 11:12 AM  Dressing Status Clean;Dry;Intact 05/21/2013 11:12 AM     Wound 12/21/12 Puncture Toe (Comment  which one) Right great toe  (Active)  Site / Wound Assessment Granulation tissue 02/21/2013  3:56 PM  % Wound base Red or Granulating 100% 02/21/2013  3:56 PM  % Wound base Yellow 0% 02/21/2013  3:56 PM  % Wound base Black 0% 02/13/2013  4:42 PM  % Wound base Other (Comment) 0% 02/13/2013  4:42 PM  Peri-wound Assessment Edema;Erythema (blanchable) 01/08/2013 11:58 AM  Wound Length (cm) 0.1 cm 02/16/2013 11:00 AM  Wound Width (cm) 0.1 cm 02/16/2013 11:00 AM  Margins Attached edges  (approximated) 12/25/2012  9:30 AM  Closure None 02/21/2013  3:56 PM  Drainage Amount None 02/21/2013  3:56 PM  Drainage Description Serous 02/13/2013  4:42 PM  Treatment Cleansed;Debridement (Selective) 02/21/2013  3:56 PM  Dressing Type Impregnated gauze (bismuth);Gauze (Comment) 02/21/2013  3:56 PM  Dressing Changed New 02/13/2013  4:42 PM  Dressing Status Clean;Dry;Intact 02/13/2013  4:42 PM     Incision 12/31/11 Face Other (Comment) (Active)     Incision 12/31/11 Abdomen Other (Comment) (Active)       Physical Therapy Assessment and Plan Wound Therapy - Assess/Plan/Recommendations Wound Therapy - Clinical Statement: Mr. Servantes continues to show improvement with overall healing, did not require any debridment today and only required dressing change with use of xeroform.  At this time will continue to change dressing as pt will be without assistance from his family and is unable to dress wound independently.  Discussed in detail about our current POC on continuing with dressing change until wound fully healed to decrease risk of continued infection and non-healing.  Wound Plan: Continue with dressing change and deibrdiment as needed.       Goals Wound Therapy Goals - Improve the function of patient's integumentary system by progressing the wound(s) through the phases of wound healing by: Decrease Necrotic Tissue to: 0 Decrease Necrotic Tissue - Progress: Met Increase Granulation Tissue to: 100 Increase Granulation Tissue - Progress: Met Decrease Length/Width/Depth - Progress: Progressing toward goal Improve Drainage Characteristics: Min Improve Drainage Characteristics - Progress: Met Patient/Family will be able to :  educated in care and dressing changes  (however unable to care independently) Patient/Family Instruction Goal - Progress: Met  Problem List Patient Active Problem List   Diagnosis Date Noted  . COPD (chronic obstructive pulmonary disease) 01/23/2013  . Anemia 08/03/2012   . Hypertension 08/03/2012  . High cholesterol 08/03/2012  . CAD (coronary artery disease) 08/03/2012  . Blood in stool 08/03/2012    GP    Alto Gandolfo, MPT ATC 05/21/2013, 12:09 PM

## 2013-05-24 DIAGNOSIS — H2510 Age-related nuclear cataract, unspecified eye: Secondary | ICD-10-CM | POA: Diagnosis not present

## 2013-05-24 DIAGNOSIS — H269 Unspecified cataract: Secondary | ICD-10-CM | POA: Diagnosis not present

## 2013-05-25 ENCOUNTER — Ambulatory Visit (HOSPITAL_COMMUNITY)
Admission: RE | Admit: 2013-05-25 | Discharge: 2013-05-25 | Disposition: A | Discharge status: Home / Self Care | Payer: Medicare Other | Source: Ambulatory Visit | Attending: Family Medicine | Admitting: Family Medicine

## 2013-05-25 DIAGNOSIS — IMO0001 Reserved for inherently not codable concepts without codable children: Secondary | ICD-10-CM | POA: Diagnosis not present

## 2013-05-25 DIAGNOSIS — I10 Essential (primary) hypertension: Secondary | ICD-10-CM | POA: Diagnosis not present

## 2013-05-25 DIAGNOSIS — S91109A Unspecified open wound of unspecified toe(s) without damage to nail, initial encounter: Secondary | ICD-10-CM | POA: Diagnosis not present

## 2013-05-25 NOTE — Progress Notes (Signed)
Physical Therapy - Wound Therapy  Treatment   Patient Details  Name: John Sampson MRN: [id number] Date of Birth: 12-28-1935  Today's Date: 05/25/2013 Time: 4098-1191 Time Calculation (min): 14 min Charge: dressing charge  Visit#: 8 of 9  Re-eval: 06/01/13  Subjective Subjective Assessment Subjective: No pain today, pt reported dressings fell down last session, daughter helped to tape dressings around leg.  Dressings intact today  Pain Assessment Pain Assessment Pain Assessment: No/denies pain  Wound Therapy  05/25/13 1443  Subjective Assessment  Subjective No pain today, pt reported dressings fell down last session, daughter helped to tape dressings around leg.  Dressings intact today  Wound  Date First Assessed/Time First Assessed: 12/21/12 1525   Wound Type: Contact dermatitis;Puncture  Location: Leg  Location Orientation: Left;Lateral  Site / Wound Assessment Granulation tissue  % Wound base Red or Granulating 100%  % Wound base Yellow 0%  Wound Length (cm) 0.4 cm  Wound Width (cm) 0.4 cm  Wound Depth (cm) 0 cm  Drainage Amount None  Treatment Cleansed  Dressing Type Impregnated gauze (petrolatum);Gauze (Comment)  Dressing Changed New  Dressing Status Clean;Dry;Intact  Wound Therapy - Assess/Plan/Recommendations  Wound Therapy - Clinical Statement Re-eval complete; wound continues to show improvement.  No debridmenet required this session just cleansed and redressed with xeroform and gauze to assure wound fully healed prior discharge to reduce risk of infection and non-healing  Wound Plan Continue x 1 week to assure wound 100% healed prior discharge    Physical Therapy Assessment and Plan Wound Therapy - Assess/Plan/Recommendations Wound Therapy - Clinical Statement: Re-eval complete; wound continues to show improvement.  No debridmenet required this session just cleansed and redressed with xeroform and gauze to assure wound fully healed prior discharge to reduce  risk of infection and non-healing Wound Plan: Continue x 1 week to assure wound 100% healed prior discharge      Goals Wound Therapy Goals - Improve the function of patient's integumentary system by progressing the wound(s) through the phases of wound healing by: Decrease Necrotic Tissue - Progress: Met Increase Granulation Tissue - Progress: Met Decrease Length/Width/Depth - Progress: Met Improve Drainage Characteristics - Progress: Met Patient/Family Instruction Goal - Progress: Met  Problem List Patient Active Problem List   Diagnosis Date Noted  . COPD (chronic obstructive pulmonary disease) 01/23/2013  . Anemia 08/03/2012  . Hypertension 08/03/2012  . High cholesterol 08/03/2012  . CAD (coronary artery disease) 08/03/2012  . Blood in stool 08/03/2012    GP Functional Assessment Tool Used: clinical judgement Functional Limitation: Other PT primary Other PT Primary Current Status (Y7829): At least 1 percent but less than 20 percent impaired, limited or restricted Other PT Primary Goal Status (F6213): 0 percent impaired, limited or restricted  [name]; Annett Fabian, MPT, ATC 05/25/2013, 3:18 PM

## 2013-05-29 DIAGNOSIS — J330 Polyp of nasal cavity: Secondary | ICD-10-CM | POA: Diagnosis not present

## 2013-05-29 DIAGNOSIS — D367 Benign neoplasm of other specified sites: Secondary | ICD-10-CM | POA: Diagnosis not present

## 2013-05-29 DIAGNOSIS — J3489 Other specified disorders of nose and nasal sinuses: Secondary | ICD-10-CM | POA: Diagnosis not present

## 2013-05-30 ENCOUNTER — Ambulatory Visit (HOSPITAL_COMMUNITY)
Admission: RE | Admit: 2013-05-30 | Discharge: 2013-05-30 | Disposition: A | Discharge status: Home / Self Care | Payer: Medicare Other | Source: Ambulatory Visit | Attending: Family Medicine | Admitting: Family Medicine

## 2013-05-30 DIAGNOSIS — S91109A Unspecified open wound of unspecified toe(s) without damage to nail, initial encounter: Secondary | ICD-10-CM | POA: Insufficient documentation

## 2013-05-30 DIAGNOSIS — IMO0001 Reserved for inherently not codable concepts without codable children: Secondary | ICD-10-CM | POA: Diagnosis not present

## 2013-05-30 DIAGNOSIS — I10 Essential (primary) hypertension: Secondary | ICD-10-CM | POA: Insufficient documentation

## 2013-05-30 DIAGNOSIS — J449 Chronic obstructive pulmonary disease, unspecified: Secondary | ICD-10-CM | POA: Insufficient documentation

## 2013-05-30 DIAGNOSIS — J4489 Other specified chronic obstructive pulmonary disease: Secondary | ICD-10-CM | POA: Insufficient documentation

## 2013-05-30 NOTE — Progress Notes (Signed)
Physical Therapy - Wound Therapy  Treatment   Patient Details  Name: John Sampson MRN: [id number] Date of Birth: 1936-02-03  Today's Date: 05/30/2013 Time: 4098-1191 Time Calculation (min): 15 min Visit#: 9 of 10  Re-eval: 06/01/13 Charges:  Reece Levy < 20cm  Subjective Subjective Assessment Subjective: Pt. states he's hoping its healed.  States no pain.  Pain Assessment Pain Assessment Pain Assessment: No/denies pain  Wound Therapy Wound 12/21/12 Contact dermatitis;Puncture Leg Left;Lateral (Active)  Site / Wound Assessment Granulation tissue 05/30/2013  9:08 AM  % Wound base Red or Granulating 100% 05/30/2013  9:08 AM  % Wound base Yellow 0% 05/30/2013  9:08 AM  % Wound base Black 0% 04/03/2013  4:35 PM  % Wound base Other (Comment) 0% 03/06/2013  4:22 PM  Peri-wound Assessment Intact 04/25/2013 12:03 PM  Wound Length (cm) 0.4 cm 05/25/2013  2:43 PM  Wound Width (cm) 0.4 cm 05/25/2013  2:43 PM  Wound Depth (cm) 0 cm 05/25/2013  2:43 PM  Closure None 04/25/2013 12:03 PM  Drainage Amount Minimal 05/30/2013  9:08 AM  Drainage Description Serous 05/30/2013  9:08 AM  Non-staged Wound Description Not applicable 05/30/2013  9:08 AM  Treatment Cleansed;Debridement (Selective) 05/30/2013  9:08 AM  Dressing Type Impregnated gauze (petrolatum);Gauze (Comment) 05/30/2013  9:08 AM  Dressing Changed Changed 05/30/2013  9:08 AM  Dressing Status Clean;Dry;Intact 05/30/2013  9:08 AM     Wound 12/21/12 Puncture Toe (Comment  which one) Right great toe  (Active)  Site / Wound Assessment Granulation tissue 02/21/2013  3:56 PM  % Wound base Red or Granulating 100% 02/21/2013  3:56 PM  % Wound base Yellow 0% 02/21/2013  3:56 PM  % Wound base Black 0% 02/13/2013  4:42 PM  % Wound base Other (Comment) 0% 02/13/2013  4:42 PM  Peri-wound Assessment Edema;Erythema (blanchable) 01/08/2013 11:58 AM  Wound Length (cm) 0.1 cm 02/16/2013 11:00 AM  Wound Width (cm) 0.1 cm 02/16/2013 11:00 AM  Margins Attached edges (approximated) 12/25/2012   9:30 AM  Closure None 02/21/2013  3:56 PM  Drainage Amount None 02/21/2013  3:56 PM  Drainage Description Serous 02/13/2013  4:42 PM  Treatment Cleansed;Debridement (Selective) 02/21/2013  3:56 PM  Dressing Type Impregnated gauze (bismuth);Gauze (Comment) 02/21/2013  3:56 PM  Dressing Changed New 02/13/2013  4:42 PM  Dressing Status Clean;Dry;Intact 02/13/2013  4:42 PM     Incision 12/31/11 Face Other (Comment) (Active)     Incision 12/31/11 Abdomen Other (Comment) (Active)       Physical Therapy Assessment and Plan Wound Therapy - Assess/Plan/Recommendations Wound Therapy - Clinical Statement: Wound continues to remain open with min-scant serous drainage.  Wound very small in size with near closure.  Debrided dry tissue from wound perimeter and applied lotion to perimeter.  Will continue once weekly to insure full closure and debridement as necessary. Wound Plan: Continue x 1 week to assure wound 100% healed prior discharge       Problem List Patient Active Problem List   Diagnosis Date Noted  . COPD (chronic obstructive pulmonary disease) 01/23/2013  . Anemia 08/03/2012  . Hypertension 08/03/2012  . High cholesterol 08/03/2012  . CAD (coronary artery disease) 08/03/2012  . Blood in stool 08/03/2012     Lurena Nida, PTA/CLT 05/30/2013, 9:15 AM

## 2013-06-05 ENCOUNTER — Ambulatory Visit (HOSPITAL_COMMUNITY)
Admission: RE | Admit: 2013-06-05 | Discharge: 2013-06-05 | Disposition: A | Discharge status: Home / Self Care | Payer: Medicare Other | Source: Ambulatory Visit | Attending: Family Medicine | Admitting: Family Medicine

## 2013-06-05 DIAGNOSIS — S91109A Unspecified open wound of unspecified toe(s) without damage to nail, initial encounter: Secondary | ICD-10-CM | POA: Diagnosis not present

## 2013-06-05 DIAGNOSIS — IMO0001 Reserved for inherently not codable concepts without codable children: Secondary | ICD-10-CM | POA: Diagnosis not present

## 2013-06-05 DIAGNOSIS — I10 Essential (primary) hypertension: Secondary | ICD-10-CM | POA: Diagnosis not present

## 2013-06-05 DIAGNOSIS — J449 Chronic obstructive pulmonary disease, unspecified: Secondary | ICD-10-CM | POA: Diagnosis not present

## 2013-06-05 NOTE — Progress Notes (Signed)
Physical Therapy - Wound Therapy  Re-evaluation/treatment   Patient Details  Name: John Sampson MRN: [id number] Date of Birth: January 30, 1936  Today's Date: 06/05/2013 Time: 4098-1191 Time Calculation (min): 20 min Charges: Self care x 18'  Visit#: 10 of 18 Re-eval: 07/03/13 Diagnosis: Non-healing wound [facility]   Subjective Subjective Assessment Subjective: Pt is pain free and without complaint.  Pain Assessment Pain Assessment Pain Assessment: No/denies pain  Wound Therapy Wound 12/21/12 Contact dermatitis;Puncture Leg Left;Lateral (Active)  Site / Wound Assessment Granulation tissue 06/05/2013  9:36 AM  % Wound base Red or Granulating 100% 06/05/2013  9:36 AM  % Wound base Yellow 0% 06/05/2013  9:36 AM  % Wound base Black 0% 04/03/2013  4:35 PM  % Wound base Other (Comment) 0% 03/06/2013  4:22 PM  Peri-wound Assessment Intact 04/25/2013 12:03 PM  Wound Length (cm) 0.4 cm  05/25/2013  2:43 PM  Wound Width (cm) 0.4 cm 05/25/2013  2:43 PM  Wound Depth (cm) 0 cm 05/25/2013  2:43 PM  Closure None 04/25/2013 12:03 PM  Drainage Amount Minimal 06/05/2013  9:36 AM  Drainage Description Serous 06/05/2013  9:36 AM  Non-staged Wound Description Not applicable 06/05/2013  9:36 AM  Treatment Cleansed 06/05/2013  9:36 AM  Dressing Type Impregnated gauze (petrolatum);Gauze (Comment) 06/05/2013  9:36 AM  Dressing Changed Changed 06/05/2013  9:36 AM  Dressing Status Clean;Dry;Intact 06/05/2013  9:36 AM     Wound 12/21/12 Puncture Toe (Comment  which one) Right great toe  (Active)  Site / Wound Assessment Granulation tissue 02/21/2013  3:56 PM  % Wound base Red or Granulating 100% 02/21/2013  3:56 PM  % Wound base Yellow 0% 02/21/2013  3:56 PM  % Wound base Black 0% 02/13/2013  4:42 PM  % Wound base Other (Comment) 0% 02/13/2013  4:42 PM  Peri-wound Assessment Edema;Erythema (blanchable) 01/08/2013 11:58 AM  Wound Length (cm) 0.1 cm 02/16/2013 11:00 AM  Wound Width (cm) 0.1 cm 02/16/2013 11:00  AM  Margins Attached edges (approximated) 12/25/2012  9:30 AM  Closure None 02/21/2013  3:56 PM  Drainage Amount None 02/21/2013  3:56 PM  Drainage Description Serous 02/13/2013  4:42 PM  Treatment Cleansed;Debridement (Selective) 02/21/2013  3:56 PM  Dressing Type Impregnated gauze (bismuth);Gauze (Comment) 02/21/2013  3:56 PM  Dressing Changed New 02/13/2013  4:42 PM  Dressing Status Clean;Dry;Intact 02/13/2013  4:42 PM     Incision 12/31/11 Face Other (Comment) (Active)     Incision 12/31/11 Abdomen Other (Comment) (Active)   Physical Therapy Assessment and Plan Wound Therapy - Assess/Plan/Recommendations Wound Therapy - Clinical Statement: Wound is mostly healed. Dry skin removed which cause bleeding. Tissue is then in healed areas. Pt would benefit from continuing wound therapy until completely wound closure as pt lives alone and wound has re-opened in the past. Wound Plan: Recommend to continue wound care until area is completely headled.    Frequency: 2x/week Duration: 4 weeks  Plan: Re-sent initial evaluation to Dr. Kae Heller at [facility] via manual fax.    Goals  All initial goals met New Goal: 06/05/13: Pt will present without opening to anterior leg in order to ensure full healing to decrease risk of secondary injury to decrease need of continued skilled intervention.   Problem List Patient Active Problem List   Diagnosis Date Noted  . COPD (chronic obstructive pulmonary disease) 01/23/2013  . Anemia 08/03/2012  . Hypertension 08/03/2012  . High cholesterol 08/03/2012  . CAD (coronary artery disease) 08/03/2012  . Blood  in stool 08/03/2012   GP Functional Assessment Tool Used: clinical judgement Functional Limitation: Other PT primary Other PT Primary Current Status (F6213): At least 1 percent but less than 20 percent impaired, limited or restricted Other PT Primary Goal Status (Y8657): 0 percent impaired, limited or restricted  Seth Bake, PTA; Annett Fabian, MPT, ATC  06/05/2013,  12:05 PM

## 2013-06-12 ENCOUNTER — Ambulatory Visit (HOSPITAL_COMMUNITY)
Admission: RE | Admit: 2013-06-12 | Discharge: 2013-06-12 | Disposition: A | Discharge status: Home / Self Care | Payer: Medicare Other | Source: Ambulatory Visit | Attending: Family Medicine | Admitting: Family Medicine

## 2013-06-12 DIAGNOSIS — S91109A Unspecified open wound of unspecified toe(s) without damage to nail, initial encounter: Secondary | ICD-10-CM | POA: Diagnosis not present

## 2013-06-12 DIAGNOSIS — I10 Essential (primary) hypertension: Secondary | ICD-10-CM | POA: Diagnosis not present

## 2013-06-12 DIAGNOSIS — IMO0001 Reserved for inherently not codable concepts without codable children: Secondary | ICD-10-CM | POA: Diagnosis not present

## 2013-06-12 DIAGNOSIS — J449 Chronic obstructive pulmonary disease, unspecified: Secondary | ICD-10-CM | POA: Diagnosis not present

## 2013-06-12 NOTE — Progress Notes (Signed)
Physical Therapy - Wound Therapy Discharge Summary     Patient Details  Name: John Sampson MRN: [id number] Date of Birth: July 08, 1936  Today's Date: 06/12/2013 Time: 4098-1191 Time Calculation (min): 15 min  Visit#: 11 of 18  Re-eval: 07/03/13 Charges: self-care x 10'  Subjective Subjective Assessment Subjective: Pt is pain free and without complaint.  Pain Assessment Pain Assessment Pain Assessment: No/denies pain  Wound Therapy Wound 12/21/12 Contact dermatitis;Puncture Leg Left;Lateral (Active)  Site / Wound Assessment Granulation tissue 06/12/2013  9:34 AM  % Wound base Red or Granulating 100% 06/12/2013  9:34 AM  % Wound base Yellow 0% 06/12/2013  9:34 AM  % Wound base Black 0% 04/03/2013  4:35 PM  % Wound base Other (Comment) 0% 03/06/2013  4:22 PM  Peri-wound Assessment Intact 04/25/2013 12:03 PM  Wound Length (cm) 0.4 cm 05/25/2013  2:43 PM  Wound Width (cm) 0.4 cm 05/25/2013  2:43 PM  Wound Depth (cm) 0 cm 05/25/2013  2:43 PM  Closure None 04/25/2013 12:03 PM  Drainage Amount Minimal 06/05/2013  9:36 AM  Drainage Description Serous 06/05/2013  9:36 AM  Non-staged Wound Description Not applicable 06/05/2013  9:36 AM  Treatment Cleansed 06/05/2013  9:36 AM  Dressing Type Impregnated gauze (petrolatum);Gauze (Comment) 06/05/2013  9:36 AM  Dressing Changed Changed 06/05/2013  9:36 AM  Dressing Status Clean;Dry;Intact 06/12/2013  9:34 AM     Wound 12/21/12 Puncture Toe (Comment  which one) Right great toe  (Active)  Site / Wound Assessment Granulation tissue 02/21/2013  3:56 PM  % Wound base Red or Granulating 100% 02/21/2013  3:56 PM  % Wound base Yellow 0% 02/21/2013  3:56 PM  % Wound base Black 0% 02/13/2013  4:42 PM  % Wound base Other (Comment) 0% 02/13/2013  4:42 PM  Peri-wound Assessment Edema;Erythema (blanchable) 01/08/2013 11:58 AM  Wound Length (cm) 0.1 cm 02/16/2013 11:00 AM  Wound Width (cm) 0.1 cm 02/16/2013 11:00 AM  Margins Attached edges (approximated) 12/25/2012  9:30 AM   Closure None 02/21/2013  3:56 PM  Drainage Amount None 02/21/2013  3:56 PM  Drainage Description Serous 02/13/2013  4:42 PM  Treatment Cleansed;Debridement (Selective) 02/21/2013  3:56 PM  Dressing Type Impregnated gauze (bismuth);Gauze (Comment) 02/21/2013  3:56 PM  Dressing Changed New 02/13/2013  4:42 PM  Dressing Status Clean;Dry;Intact 02/13/2013  4:42 PM     Incision 12/31/11 Face Other (Comment) (Active)     Incision 12/31/11 Abdomen Other (Comment) (Active)       Physical Therapy Assessment and Plan Wound Therapy - Assess/Plan/Recommendations Wound Therapy - Clinical Statement: Wound is now completely healed. Advised pt to watch area for any abnormal changes. Also encouraged pt to keep leg moisturized to improve skin integrity. Wound Plan: Recommend D/C as wound is completely healed.  Problem List Patient Active Problem List   Diagnosis Date Noted  . COPD (chronic obstructive pulmonary disease) 01/23/2013  . Anemia 08/03/2012  . Hypertension 08/03/2012  . High cholesterol 08/03/2012  . CAD (coronary artery disease) 08/03/2012  . Blood in stool 08/03/2012    GP Functional Assessment Tool Used: clinical judgement Functional Limitation: Other PT primary Other PT Primary Goal Status (Y7829): 0 percent impaired, limited or restricted Other PT Primary Discharge Status (F6213): 0 percent impaired, limited or restricted  Seth Bake, PTA 06/12/2013, 9:36 AM

## 2013-08-07 DIAGNOSIS — I739 Peripheral vascular disease, unspecified: Secondary | ICD-10-CM | POA: Diagnosis not present

## 2013-08-07 DIAGNOSIS — I2510 Atherosclerotic heart disease of native coronary artery without angina pectoris: Secondary | ICD-10-CM | POA: Diagnosis not present

## 2013-08-07 DIAGNOSIS — J449 Chronic obstructive pulmonary disease, unspecified: Secondary | ICD-10-CM | POA: Diagnosis not present

## 2013-08-08 ENCOUNTER — Other Ambulatory Visit (HOSPITAL_COMMUNITY): Payer: Self-pay | Admitting: Cardiovascular Disease

## 2013-08-08 DIAGNOSIS — R0989 Other specified symptoms and signs involving the circulatory and respiratory systems: Secondary | ICD-10-CM

## 2013-08-14 ENCOUNTER — Other Ambulatory Visit: Payer: Self-pay | Admitting: Cardiovascular Disease

## 2013-08-14 DIAGNOSIS — E782 Mixed hyperlipidemia: Secondary | ICD-10-CM | POA: Diagnosis not present

## 2013-08-14 DIAGNOSIS — E119 Type 2 diabetes mellitus without complications: Secondary | ICD-10-CM | POA: Diagnosis not present

## 2013-08-14 DIAGNOSIS — R6889 Other general symptoms and signs: Secondary | ICD-10-CM | POA: Diagnosis not present

## 2013-08-14 DIAGNOSIS — R5381 Other malaise: Secondary | ICD-10-CM | POA: Diagnosis not present

## 2013-08-14 LAB — CBC WITH DIFFERENTIAL/PLATELET
Eosinophils Absolute: 0.8 10*3/uL — ABNORMAL HIGH (ref 0.0–0.7)
Hemoglobin: 14.3 g/dL (ref 13.0–17.0)
Lymphs Abs: 1.2 10*3/uL (ref 0.7–4.0)
MCH: 29.8 pg (ref 26.0–34.0)
MCV: 89.8 fL (ref 78.0–100.0)
Monocytes Relative: 10 % (ref 3–12)
Neutrophils Relative %: 64 % (ref 43–77)
RBC: 4.8 MIL/uL (ref 4.22–5.81)

## 2013-08-14 LAB — LIPID PANEL
Cholesterol: 184 mg/dL (ref 0–200)
Triglycerides: 108 mg/dL (ref <150)
VLDL: 22 mg/dL (ref 0–40)

## 2013-08-14 LAB — HEMOGLOBIN A1C: Mean Plasma Glucose: 120 mg/dL — ABNORMAL HIGH (ref <117)

## 2013-08-14 LAB — COMPREHENSIVE METABOLIC PANEL
Albumin: 4.4 g/dL (ref 3.5–5.2)
CO2: 30 mEq/L (ref 19–32)
Calcium: 10 mg/dL (ref 8.4–10.5)
Glucose, Bld: 99 mg/dL (ref 70–99)
Sodium: 141 mEq/L (ref 135–145)
Total Bilirubin: 0.6 mg/dL (ref 0.3–1.2)
Total Protein: 6.9 g/dL (ref 6.0–8.3)

## 2013-08-21 ENCOUNTER — Ambulatory Visit (HOSPITAL_COMMUNITY): Payer: Medicare Other

## 2013-08-22 ENCOUNTER — Encounter: Payer: Self-pay | Admitting: Cardiovascular Disease

## 2013-09-04 ENCOUNTER — Ambulatory Visit (HOSPITAL_COMMUNITY)
Admission: RE | Admit: 2013-09-04 | Discharge: 2013-09-04 | Disposition: A | Discharge status: Home / Self Care | Payer: Medicare Other | Source: Ambulatory Visit | Attending: Cardiovascular Disease | Admitting: Cardiovascular Disease

## 2013-09-04 DIAGNOSIS — E785 Hyperlipidemia, unspecified: Secondary | ICD-10-CM | POA: Insufficient documentation

## 2013-09-04 DIAGNOSIS — I658 Occlusion and stenosis of other precerebral arteries: Secondary | ICD-10-CM | POA: Diagnosis not present

## 2013-09-04 DIAGNOSIS — I2510 Atherosclerotic heart disease of native coronary artery without angina pectoris: Secondary | ICD-10-CM | POA: Diagnosis not present

## 2013-09-04 DIAGNOSIS — I6529 Occlusion and stenosis of unspecified carotid artery: Secondary | ICD-10-CM | POA: Insufficient documentation

## 2013-09-04 DIAGNOSIS — R0989 Other specified symptoms and signs involving the circulatory and respiratory systems: Secondary | ICD-10-CM

## 2013-09-04 DIAGNOSIS — I10 Essential (primary) hypertension: Secondary | ICD-10-CM | POA: Insufficient documentation

## 2013-09-12 NOTE — Addendum Note (Signed)
Encounter addended by: Harlon Ditty, PTA on: 09/12/2013  9:17 AM<BR>     Documentation filed: Clinical Notes

## 2013-11-04 ENCOUNTER — Emergency Department (HOSPITAL_COMMUNITY)
Admission: EM | Admit: 2013-11-04 | Discharge: 2013-11-04 | Disposition: A | Discharge status: Home / Self Care | Payer: Medicare Other | Attending: Emergency Medicine | Admitting: Emergency Medicine

## 2013-11-04 ENCOUNTER — Encounter (HOSPITAL_COMMUNITY): Payer: Self-pay | Admitting: Emergency Medicine

## 2013-11-04 DIAGNOSIS — Z79899 Other long term (current) drug therapy: Secondary | ICD-10-CM | POA: Diagnosis not present

## 2013-11-04 DIAGNOSIS — Z951 Presence of aortocoronary bypass graft: Secondary | ICD-10-CM | POA: Diagnosis not present

## 2013-11-04 DIAGNOSIS — L02419 Cutaneous abscess of limb, unspecified: Secondary | ICD-10-CM | POA: Diagnosis not present

## 2013-11-04 DIAGNOSIS — Y929 Unspecified place or not applicable: Secondary | ICD-10-CM | POA: Insufficient documentation

## 2013-11-04 DIAGNOSIS — IMO0001 Reserved for inherently not codable concepts without codable children: Secondary | ICD-10-CM | POA: Insufficient documentation

## 2013-11-04 DIAGNOSIS — Z87891 Personal history of nicotine dependence: Secondary | ICD-10-CM | POA: Insufficient documentation

## 2013-11-04 DIAGNOSIS — Z7982 Long term (current) use of aspirin: Secondary | ICD-10-CM | POA: Insufficient documentation

## 2013-11-04 DIAGNOSIS — Y939 Activity, unspecified: Secondary | ICD-10-CM | POA: Insufficient documentation

## 2013-11-04 DIAGNOSIS — K219 Gastro-esophageal reflux disease without esophagitis: Secondary | ICD-10-CM | POA: Diagnosis not present

## 2013-11-04 DIAGNOSIS — I2510 Atherosclerotic heart disease of native coronary artery without angina pectoris: Secondary | ICD-10-CM | POA: Insufficient documentation

## 2013-11-04 DIAGNOSIS — X58XXXA Exposure to other specified factors, initial encounter: Secondary | ICD-10-CM | POA: Insufficient documentation

## 2013-11-04 DIAGNOSIS — J441 Chronic obstructive pulmonary disease with (acute) exacerbation: Secondary | ICD-10-CM | POA: Diagnosis not present

## 2013-11-04 DIAGNOSIS — I252 Old myocardial infarction: Secondary | ICD-10-CM | POA: Diagnosis not present

## 2013-11-04 DIAGNOSIS — I10 Essential (primary) hypertension: Secondary | ICD-10-CM | POA: Insufficient documentation

## 2013-11-04 DIAGNOSIS — S81802A Unspecified open wound, left lower leg, initial encounter: Secondary | ICD-10-CM

## 2013-11-04 DIAGNOSIS — S81009A Unspecified open wound, unspecified knee, initial encounter: Secondary | ICD-10-CM | POA: Diagnosis not present

## 2013-11-04 MED ORDER — DOUBLE ANTIBIOTIC 500-10000 UNIT/GM EX OINT
TOPICAL_OINTMENT | Freq: Once | CUTANEOUS | Status: AC
  Administered 2013-11-04: 1 via TOPICAL
  Filled 2013-11-04: qty 1

## 2013-11-04 NOTE — ED Provider Notes (Signed)
CSN: [id number]     Arrival date & time 11/04/13  1035 History  This chart was scribed for Doug Sou, MD by Dorothey Baseman, ED Scribe. This patient was seen in room APA03/APA03 and the patient's care was started at 12:40 PM.    Chief Complaint  Patient presents with  . Leg Pain   The history is provided by the patient. No language interpreter was used.   HPI Comments: John Sampson is a 77 y.o. male who presents to the Emergency Department complaining of a wound to the left, anterior lower leg that occurred 2 weeks ago when he states that he was washing the leg and his skin "peeled off." He reports an associated, burning pain to the area. He reports a history of a similar incident that he received antibiotics for, which he states was not effective at providing relief, and that he followed up at the wound care clinic. He denies fever. He reports that his last tetanus vaccination was 3 months ago and is UTD. Patient has a history of COPD, HTN, CAD, and MI. Patient reports a history of CABG.   Past Medical History  Diagnosis Date  . Shortness of breath   . COPD (chronic obstructive pulmonary disease)   . Hypertension   . GERD (gastroesophageal reflux disease)   . Headache(784.0)   . Coronary artery disease     SEE DR Alanda Amass...  . Myocardial infarction    Past Surgical History  Procedure Laterality Date  . Coronary artery bypass graft      2003  X  5 GRAFTS  . Eye surgery      06/2011  . Tonsillectomy      AGE 66  . Craniotomy  12/31/2011    Procedure: CRANIOTOMY HYPOPHYSECTOMY TRANSNASAL APPROACH;  Surgeon: Mariam Dollar, MD;  Location: [facility];  Service: Neurosurgery;  Laterality: N/A;  Transphenoidal Hypophysectomy With Fat Graft Harvest from right abdomen Dr. Narda Bonds to assist   . Colonoscopy  08/18/2012    Procedure: COLONOSCOPY;  Surgeon: Malissa Hippo, MD;  Location: [facility];  Service: Endoscopy;  Laterality: N/A;  1:25  . Esophagogastroduodenoscopy  08/18/2012     Procedure: ESOPHAGOGASTRODUODENOSCOPY (EGD);  Surgeon: Malissa Hippo, MD;  Location: [facility];  Service: Endoscopy;  Laterality: N/A;  . Givens capsule study N/A 01/25/2013    Procedure: GIVENS CAPSULE STUDY;  Surgeon: Malissa Hippo, MD;  Location: [facility];  Service: Endoscopy;  Laterality: N/A;  730   No family history on file. History  Substance Use Topics  . Smoking status: Former Smoker -- 3.00 packs/day for 45 years    Quit date: 01/23/2002  . Smokeless tobacco: Not on file     Comment: Quit 11 yrs ago  . Alcohol Use: No    Review of Systems  Constitutional: Negative.  Negative for fever.  HENT: Negative.   Respiratory: Negative.   Cardiovascular: Negative.   Gastrointestinal: Negative.   Musculoskeletal: Positive for myalgias.  Skin: Positive for wound.  Neurological: Negative.   Psychiatric/Behavioral: Negative.     Allergies  Review of patient's allergies indicates no known allergies.  Home Medications   Current Outpatient Rx  Name  Route  Sig  Dispense  Refill  . albuterol-ipratropium (COMBIVENT) 18-103 MCG/ACT inhaler   Inhalation   Inhale 2 puffs into the lungs daily.          [name] aspirin EC 81 MG tablet   Oral   Take 162 mg  by mouth daily.         [name] diltiazem (TIAZAC) 180 MG 24 hr capsule   Oral   Take 180 mg by mouth daily.         . ferrous sulfate 325 (65 FE) MG tablet   Oral   Take 1 tablet (325 mg total) by mouth 2 (two) times daily with a meal.         . fish oil-omega-3 fatty acids 1000 MG capsule   Oral   Take 2 g by mouth daily.         . folic acid (FOLVITE) 1 MG tablet   Oral   Take 2 mg by mouth daily.         [name] lisinopril (PRINIVIL,ZESTRIL) 40 MG tablet   Oral   Take 20 mg by mouth daily.         . metoprolol succinate (TOPROL-XL) 50 MG 24 hr tablet   Oral   Take 25 mg by mouth daily. Take with or immediately following a meal.         . Multiple Vitamin (MULTIVITAMIN) tablet   Oral   Take 1 tablet  by mouth daily.         . pantoprazole (PROTONIX) 40 MG tablet   Oral   Take 40 mg by mouth daily.         . potassium chloride (MICRO-K) 10 MEQ CR capsule   Oral   Take 10 mEq by mouth 2 (two) times daily.          . simvastatin (ZOCOR) 40 MG tablet   Oral   Take 40 mg by mouth every evening.         . furosemide (LASIX) 40 MG tablet   Oral   Take 40 mg by mouth daily as needed for fluid.           Triage Vitals: BP 142/60  Pulse 66  Temp(Src) 97.8 F (36.6 C) (Oral)  Resp 16  SpO2 97%  Physical Exam  Nursing note and vitals reviewed. Constitutional: He appears well-developed and well-nourished.  HENT:  Head: Normocephalic and atraumatic.  Eyes: Conjunctivae are normal. Pupils are equal, round, and reactive to light.  Neck: Neck supple. No tracheal deviation present. No thyromegaly present.  Cardiovascular: Normal rate, regular rhythm and normal heart sounds.  Exam reveals no gallop and no friction rub.   No murmur heard. Pulmonary/Chest: Effort normal and breath sounds normal. No respiratory distress. He has no wheezes. He has no rales.  Abdominal: Soft. Bowel sounds are normal. He exhibits no distension. There is no tenderness.  Musculoskeletal: Normal range of motion. He exhibits no edema and no tenderness.  2+ DP pulse on the left.   Neurological: He is alert. Coordination normal.  Skin: Skin is warm and dry. No rash noted. There is erythema.  Two scabbed wounds that are about 2 cm each and circular over the left shin with surrounding erythema. No red streaking of the leg.   Psychiatric: He has a normal mood and affect.    ED Course  Procedures (including critical care time)  DIAGNOSTIC STUDIES: Oxygen Saturation is 97% on room air, normal by my interpretation.    COORDINATION OF CARE: 12:47 PM- Discussed that the wound will be cleaned and dressed today in the ED. Advised patient to follow up at the referred wound clinic. Discussed treatment plan  with patient at bedside and patient verbalized agreement.     Labs Review Labs Reviewed -  No data to display Imaging Review No results found.  EKG Interpretation   None       MDM  No diagnosis found. Patient reports has had similar episodes in the past for oral antibiotics did not help. He has recently completed 2 weeks of an oral antibiotic. He exhibits no signs of toxicity. He requires local wound care and states that he is unable or change dressings himself. He requests a referral to wound center which she's had in the past for similar episodes   Diagnosis abrasion the left shin with localized cellulitis    Doug Sou, MD 11/04/13 1303

## 2013-11-04 NOTE — ED Notes (Signed)
Pt state he has thin skin. States he was washing his left lower leg and the skin peeled off. States this has happened before and he had to go to the wound clinic.

## 2013-11-07 ENCOUNTER — Ambulatory Visit (HOSPITAL_COMMUNITY)
Admission: RE | Admit: 2013-11-07 | Discharge: 2013-11-07 | Disposition: A | Discharge status: Home / Self Care | Payer: Medicare Other | Source: Ambulatory Visit | Attending: Family Medicine | Admitting: Family Medicine

## 2013-11-07 DIAGNOSIS — J449 Chronic obstructive pulmonary disease, unspecified: Secondary | ICD-10-CM | POA: Insufficient documentation

## 2013-11-07 DIAGNOSIS — I10 Essential (primary) hypertension: Secondary | ICD-10-CM | POA: Diagnosis not present

## 2013-11-07 DIAGNOSIS — S91109A Unspecified open wound of unspecified toe(s) without damage to nail, initial encounter: Secondary | ICD-10-CM | POA: Diagnosis not present

## 2013-11-07 DIAGNOSIS — IMO0001 Reserved for inherently not codable concepts without codable children: Secondary | ICD-10-CM | POA: Diagnosis not present

## 2013-11-07 DIAGNOSIS — J4489 Other specified chronic obstructive pulmonary disease: Secondary | ICD-10-CM | POA: Insufficient documentation

## 2013-11-07 NOTE — Evaluation (Cosign Needed Addendum)
Physical Therapy Evaluation  Patient Details  Name: John Sampson MRN: [id number] Date of Birth: [birth year]  Today's Date: 11/07/2013 Time: 0930-1010 PT Time Calculation (min): 40 min Charge eval             Visit#: 1 of 8  Re-eval: 12/07/13    Authorization: Medicare      Authorization Visit#: 1 of 8   Past Medical History:  Past Medical History  Diagnosis Date  . Shortness of breath   . COPD (chronic obstructive pulmonary disease)   . Hypertension   . GERD (gastroesophageal reflux disease)   . Headache(784.0)   . Coronary artery disease     SEE DR Alanda Amass...  . Myocardial infarction    Past Surgical History:  Past Surgical History  Procedure Laterality Date  . Coronary artery bypass graft      2003  X  5 GRAFTS  . Eye surgery      06/2011  . Tonsillectomy      AGE 55  . Craniotomy  12/31/2011    Procedure: CRANIOTOMY HYPOPHYSECTOMY TRANSNASAL APPROACH;  Surgeon: Mariam Dollar, MD;  Location: [facility];  Service: Neurosurgery;  Laterality: N/A;  Transphenoidal Hypophysectomy With Fat Graft Harvest from right abdomen Dr. Narda Bonds to assist   . Colonoscopy  08/18/2012    Procedure: COLONOSCOPY;  Surgeon: Malissa Hippo, MD;  [facility]: [facility];  Service: Endoscopy;  Laterality: N/A;  1:25  . Esophagogastroduodenoscopy  08/18/2012    Procedure: ESOPHAGOGASTRODUODENOSCOPY (EGD);  Surgeon: Malissa Hippo, MD;  [facility];  Service: Endoscopy;  Laterality: N/A;  . Givens capsule study N/A 01/25/2013    Procedure: GIVENS CAPSULE STUDY;  Surgeon: Malissa Hippo, MD;  Location: [facility];  Service: Endoscopy;  Laterality: N/A;  730    Subjective Symptoms/Limitations Symptoms: Pt states that he was showering approximately two weeks ago and scrubed the anterior aspect of his Lt LE when the skin peeled off.  He has been trying to manage at home but had noted that the pain and reddess was increasing with no sign of healing from the wound.  He had been  on antibiotic for about two weeks therefore he went to the ER.  He was referred to therapy from the ER for wound care Pertinent History: Pt had a prior non-healing wound on his Lt anterior LE in January.  The pt was seen for 29 visits and discharged.  The pt returned three weeks later with his LE red and swollen and was seen for 11 treatments more before the wound was healed.   Pain Assessment Currently in Pain?: Yes Pain Score: 8  Pain Location: Leg Pain Orientation: Left Pain Type: Chronic pain Pain Onset: 1 to 4 weeks ago Pain Frequency: Constant Wound 1:   Location:  Superior wound on Lt LE Length: 2.5 Width: 2.0 Depth: q Tunneling: q Granulation: 20% Slough: 80% Drainage (amount/description): moderate serous Periwound: macerated and fragile for a 1.5 cm halo;  Other: Anterior aspect of Lt LE is bright red for a 15 cmx 14 cm area with the two wounds enclosed in the inflammation. There are multiple smaller wounds surrounding this that are smaller that .3 x.2 cm.  Second wound:  Is inferior to the first measuring 2 cm x 2.5cm with no depth 80% granulated with 20 % slough.       Dressing Used: aquacell placed on first wound with xeroform placed on second wound followed by 4x4, cotton, kerlix and  netting.   Pt did not want coban at this time due to coban becoming too tight on his LE in January.  Pt does have increased swelling.  Therapist explained to pt that we would go without coban at this time but if his wound did not look better we would return to coban next treatment and make sure to wrap his LE without pressure. Sharps Used/Location: forceps Tissue Remove: slough very painful for pt.   Clinical  Impressions: Pt is a 77 year old referred to OP PT for wound care with impairments listed below.  Pt will benefit from skilled OP PT wound care to address current impairments listed below: Increased necrotic tissue Decreased Granulation Tissue Altered Sensation Venous  Insufficiency Edema  PT. Plan: Selective sharps debridement, Dressing Change,  Pt/family Education  Frequency/Duration:  ____2_x/week for ___4__weeks.  PT Prognosis Wound Therapy - Potential for Goals: Excellent Good Fair Poor  Goals: Wound Therapy Goals - Improve the function of patient's integumentary system by progressing the wound(s) through the phases of wound healing by:  Decrease Necrotic Tissue to: 0  Decrease Necrotic Tissue - Progress: Goal set today  Increase Granulation Tissue to: 100  Increase Granulation Tissue - Progress: Goal set today  Decrease Length/Width/Depth by (cm): .5x .5 cm Decrease Length/Width/Depth - Progress: Goal set today  Improve Drainage Characteristics: scant Improve Drainage Characteristics - Progress: Goal set today  Patient/Family will be able to : self dress wound verbalize the importance of keeping skin hydrated and not putting adhesives (bandaids) on skin Patient/Family Instruction Goal - Progress: Goal set today  Goals/treatment plan/discharge plan were made with and agreed upon by patient/family: Yes       Problem List Patient Active Problem List   Diagnosis Date Noted  . COPD (chronic obstructive pulmonary disease) 01/23/2013  . Anemia 08/03/2012  . Hypertension 08/03/2012  . High cholesterol 08/03/2012  . CAD (coronary artery disease) 08/03/2012  . Blood in stool 08/03/2012       GP Functional Assessment Tool Used: clinical judgement Other PT Primary Current Status [phone]): At least 40 percent but less than 60 percent impaired, limited or restricted Other PT Primary Goal Status (U0454): At least 1 percent but less than 20 percent impaired, limited or restricted  RUSSELL,CINDY 11/07/2013, 10:52 AM  Physician Documentation Your signature is required to indicate approval of the treatment plan as stated above.  Please sign and either send electronically or make a copy of this report for your files and return this physician signed  original.   Please mark one 1.__approve of plan  2. ___approve of plan with the following conditions.   ______________________________                                                          _____________________ Physician Signature  Date  

## 2013-11-09 ENCOUNTER — Ambulatory Visit (HOSPITAL_COMMUNITY)
Admission: RE | Admit: 2013-11-09 | Discharge: 2013-11-09 | Disposition: A | Discharge status: Home / Self Care | Payer: Medicare Other | Source: Ambulatory Visit | Attending: Family Medicine | Admitting: Family Medicine

## 2013-11-09 DIAGNOSIS — S81009A Unspecified open wound, unspecified knee, initial encounter: Secondary | ICD-10-CM | POA: Insufficient documentation

## 2013-11-09 DIAGNOSIS — IMO0001 Reserved for inherently not codable concepts without codable children: Secondary | ICD-10-CM | POA: Insufficient documentation

## 2013-11-09 NOTE — Progress Notes (Signed)
Physical Therapy Treatment Patient Details  Name: John Sampson MRN: [id number] Date of Birth: 09-28-1936  Today's Date: 11/09/2013 Time: 4098-1191 PT Time Calculation (min): 23 min Charges: Deb < 20 cm Visit#: 2 of 8  Re-eval:      Authorization: Medicare  Authorization Time Period:    Authorization Visit#: 2 of 10   Subjective: Symptoms/Limitations Symptoms: Reports that his leg is sore and the dressing is falling down.  Pain Assessment Currently in Pain?: 8/10 to distal LLE  Precautions/Restrictions     Exercise/Treatments   Wound 1:  Location: Superior wound on Lt LE  Length: 2.5 (taken on 12/9) Width: 2.0 ((taken on 12/9)  Depth: 0 (taken on 12/9) Tunneling: 0 (taken on 12/9)  Granulation: 30%  Slough: 70%  Drainage (amount/description): moderate serous  Periwound: macerated and fragile for a 1.5 cm halo;  Other: Anterior aspect of Lt LE is bright red for a 15 cmx 14 cm area with the two wounds enclosed in the inflammation. There are multiple smaller wounds surrounding this that are smaller that .3 x.2 cm.  Second wound: Is inferior to the first measuring 2 cm x 2.5cm with no depth 80% granulated with 20 % slough. (taken on 12/9)  Wound 2 Location:  inferior to the first Length: 2 cm  Width:  2.5cm with no  Depth: 0 Granulation: 100% Slough: 0%  Dressing Used: aquacell placed on first wound with xeroform placed on second wound followed by 4x4, cotton, kerlix and netting. Pt did not want coban at this time due to coban becoming too tight on his LE in January. Pt does have increased swelling. Therapist explained to pt that we would go without coban at this time but if his wound did not look better we would return to coban next treatment and make sure to wrap his LE without pressure.  Sharps Used/Location: forceps  Tissue Remove: slough   Physical Therapy Assessment and Plan PT Assessment and Plan Clinical Impression Statement: Continued with wound care and  discussed with patient importance of compression to decrease risk of wounds and to assist with healing wounds. Continued with xeroform to lower wound and aquacel to to superior wounds.   PT Plan: Continue with wound care and compression dressings as needed.       Goals:  Wound Therapy Goals - Improve the function of patient's integumentary system by progressing the wound(s) through the phases of wound healing by:  Decrease Necrotic Tissue to: 0  Decrease Necrotic Tissue - Progress: Progressing towards Increase Granulation Tissue to: 100  Increase Granulation Tissue - Progress:Progressing towards Decrease Length/Width/Depth by (cm): .5x .5 cm  Decrease Length/Width/Depth - Progress: Progressing towards Improve Drainage Characteristics: scant  Improve Drainage Characteristics - Progress: Progressing towards Patient/Family will be able to : self dress wound verbalize the importance of keeping skin hydrated and not putting adhesives (bandaids) on skin  Patient/Family Instruction Goal - Progress: Progressing towards Goals/treatment plan/discharge plan were made with and agreed upon by patient/family: Yes    Problem List Patient Active Problem List   Diagnosis Date Noted  . COPD (chronic obstructive pulmonary disease) 01/23/2013  . Anemia 08/03/2012  . Hypertension 08/03/2012  . High cholesterol 08/03/2012  . CAD (coronary artery disease) 08/03/2012  . Blood in stool 08/03/2012       GP    John Sampson, MPT, ATC 11/09/2013, 10:33 AM

## 2013-11-12 ENCOUNTER — Ambulatory Visit (HOSPITAL_COMMUNITY)
Admission: RE | Admit: 2013-11-12 | Discharge: 2013-11-12 | Disposition: A | Discharge status: Home / Self Care | Payer: Medicare Other | Source: Ambulatory Visit | Attending: Emergency Medicine | Admitting: Emergency Medicine

## 2013-11-12 NOTE — Progress Notes (Signed)
Physical Therapy Treatment Patient Details  Name: John Sampson MRN: [id number] Date of Birth: Jun 01, 1936  Today's Date: 11/12/2013 Time: 4098-1191 PT Time Calculation (min): 30 min Charges: Selective debridement (= or < 20 cm)   Visit#: 3 of 8   Authorization: Medicare  Authorization Visit#: 3 of 10   Subjective: Symptoms/Limitations Symptoms: Pt states that he has most pain at night. Pain Assessment Currently in Pain?: Yes Pain Score: 4  Pain Location: Leg Pain Orientation: Left  Wound 1:  Location: Superior wound on Lt LE  Length: 2.5 (taken on 12/9)  Width: 2.0 (taken on 12/9)  Depth: 0 (taken on 12/9)  Tunneling: 0 (taken on 12/9)  Granulation: 80 % Slough: 20%  Drainage (amount/description): moderate serous  Dressing Used: Xeroform, Vaseline and perimeter, kerlex and #5 netting, anchored with medipore  Wound 2 : Location: inferior to the first  Length: 2 cm (taken 12/9) Width: 2.5cm (taken 12/9) Depth: 0 (taken 12/9) Granulation: 100%  Slough: 0%  Dressing Used: Xeroform, Vaseline and perimeter, kerlex and #5 netting, anchored with medipore Sharps Used/Location: forceps  Tissue Remove: slough   Physical Therapy Assessment and Plan PT Assessment and Plan Clinical Impression Statement: Dressed all wounds with xeroform as silver hydrofiber was adhered to wound when dressing was removed. Vaseline applied to periwound to protect skin integrity. LLE wrapped with kerlex and #5 netting. Wrap secured with medipore. PT Plan: Continue with wound care and compression dressings as needed.      Problem List Patient Active Problem List   Diagnosis Date Noted  . COPD (chronic obstructive pulmonary disease) 01/23/2013  . Anemia 08/03/2012  . Hypertension 08/03/2012  . High cholesterol 08/03/2012  . CAD (coronary artery disease) 08/03/2012  . Blood in stool 08/03/2012    PT - End of Session Activity Tolerance: Patient tolerated treatment well General Behavior  During Therapy: [facility] for tasks assessed/performed  Seth Bake, PTA  11/12/2013, 4:06 PM

## 2013-11-15 ENCOUNTER — Ambulatory Visit (HOSPITAL_COMMUNITY)
Admission: RE | Admit: 2013-11-15 | Discharge: 2013-11-15 | Disposition: A | Discharge status: Home / Self Care | Payer: Medicare Other | Source: Ambulatory Visit

## 2013-11-15 NOTE — Progress Notes (Signed)
Physical Therapy - Wound Therapy  Treatment   Patient Details  Name: John Sampson MRN: [id number] Date of Birth: Apr 11, 1936  Today's Date: 11/15/2013 Time: 4098-1191 Time Calculation (min): 28 min  Visit#: 4 of 8  Re-eval: 12/07/13  Subjective Subjective Assessment Subjective: Pt stated LE is feeling better today, pt has not taken his fluid pills today.  Pain Assessment Pain Assessment Pain Score: 1  Pain Location: Leg Pain Orientation: Left  Wound Therapy Location: Superior wound on Lt LE  Length: 2.5 (taken on 12/9)  Width: 2.0 (taken on 12/9)  Depth: 0 (taken on 12/9)   Tunneling: 0 (taken on 12/9)  Granulation: 85% Slough: 15%  Drainage (amount/description): moderate serous  Dressing Used: Xeroform, Vaseline and perimeter, kerlex, coban and #5 netting, anchored with medipore   Wound 2 :  Location: inferior to the first  Length: 2 cm (taken 12/9)  Width: 2.5cm (taken 12/9)  Depth: 0 (taken 12/9)  Granulation: 95% Slough: 5%  Dressing Used: Xeroform, Vaseline and perimeter, kerlex, coban and #5 netting, anchored with medipore  Sharps Used/Location: forceps  Tissue Remove: slough    Physical Therapy Assessment and Plan   PT Assessment and Plan Clinical Impression Statement: Increased granulation following granualtion for removal of slough.  Noted increased maceration periwound.  LE increased edema.  Wound dressed with xeroform, vaseline applied perimeter, wrapped in kerlex and LOOSE coban for edema control. No reports of pain through session.   PT Plan: Continue with wound care and compression dressings as needed.     Goals    Problem List Patient Active Problem List   Diagnosis Date Noted  . COPD (chronic obstructive pulmonary disease) 01/23/2013  . Anemia 08/03/2012  . Hypertension 08/03/2012  . High cholesterol 08/03/2012  . CAD (coronary artery disease) 08/03/2012  . Blood in stool 08/03/2012    GP    [name] 11/15/2013, 4:03  PM

## 2013-11-19 ENCOUNTER — Ambulatory Visit (HOSPITAL_COMMUNITY)
Admission: RE | Admit: 2013-11-19 | Discharge: 2013-11-19 | Disposition: A | Discharge status: Home / Self Care | Payer: Medicare Other | Source: Ambulatory Visit | Attending: Emergency Medicine | Admitting: Emergency Medicine

## 2013-11-19 NOTE — Progress Notes (Signed)
Physical Therapy Treatment Patient Details  Name: John Sampson MRN: [id number] Date of Birth: 05-19-1936  Today's Date: 11/19/2013 Time: 1040-1110 PT Time Calculation (min): 30 min Charges: Selective debridement (= or < 20 cm)   0Visit#: 5 of 8  Re-eval: 12/07/13  Authorization: Medicare  Authorization Visit#: 5 of 10   Subjective: Symptoms/Limitations Symptoms: Pt states that compression wrap was tolerable.  Pain Assessment Currently in Pain?: No/denies  Wound Therapy Location: Superior wound on Lt LE  Length: 2.5 (taken on 12/9)  Width: 2.0 (taken on 12/9)  Depth: 0 (taken on 12/9)  Tunneling: 0 (taken on 12/9)  Granulation: 95%  Slough: 15%  Drainage (amount/description): moderate serous  Dressing Used: Xeroform, Vaseline and perimeter, kerlex, coban and #5 netting, anchored with medipore  Wound 2 :  Location: inferior to the first  Length: 2 cm (taken 12/9)  Width: 2.5cm (taken 12/9)  Depth: 0 (taken 12/9)  Granulation: 100%  Slough: 0%  Dressing Used: Xeroform, Vaseline and perimeter, kerlex, coban and #5 netting, anchored with medipore  Sharps Used/Location: forceps  Tissue Remove: slough   Physical Therapy Assessment and Plan PT Assessment and Plan Clinical Impression Statement: Wounds have progressed significantly since last session. Skin integrity of LLE has improved. Swelling has significantly decreased. Pt tolerates debridement well. Continued with xeroform dressing and Vaseline at perimeter. Compression wrap applied to control edema. PT Plan: Continue with wound care and compression dressings as needed.      Problem List Patient Active Problem List   Diagnosis Date Noted  . COPD (chronic obstructive pulmonary disease) 01/23/2013  . Anemia 08/03/2012  . Hypertension 08/03/2012  . High cholesterol 08/03/2012  . CAD (coronary artery disease) 08/03/2012  . Blood in stool 08/03/2012    PT - End of Session Activity Tolerance: Patient tolerated  treatment well General Behavior During Therapy: [facility] for tasks assessed/performed  Seth Bake, PTA  11/19/2013, 11:51 AM

## 2013-11-23 ENCOUNTER — Ambulatory Visit (HOSPITAL_COMMUNITY)
Admission: RE | Admit: 2013-11-23 | Discharge: 2013-11-23 | Disposition: A | Discharge status: Home / Self Care | Payer: Medicare Other | Source: Ambulatory Visit | Attending: Emergency Medicine | Admitting: Emergency Medicine

## 2013-11-23 NOTE — Progress Notes (Signed)
Physical Therapy - Wound Therapy  Treatment   Patient Details  Name: John Sampson MRN: [id number] Date of Birth: 10/17/1936  Today's Date: 11/23/2013 Time: 4098-1191 Time Calculation (min): 25 min Charge: selective debridement <20 cm  Visit#: 6 of 8  Re-eval: 12/07/13  Subjective Subjective Assessment Subjective: Pt states that compression wrap was tolerable, and believes it helps alot    Pain Assessment Pain Assessment Pain Assessment: No/denies pain  Wound Therapy Location: Superior wound on Lt LE  Granulation: 95%  Slough: 05%  Drainage (amount/description): minimal serous  Dressing Used: Xeroform, Vaseline and perimeter, kerlex, coban and #5 netting, anchored with medipore   Wound 2 :  Location: inferior to the first  Granulation: 100%  Slough: 0% - Dressing Used: Vaseline kerlex, coban and #5 netting, anchored with medipore  Sharps Used/Location: forceps  Tissue Remove: slough       Physical Therapy Assessment and Plan Wound Therapy - Assess/Plan/Recommendations Wound Therapy - Clinical Statement: Inferior wound fully healed.  Improved skin integrity noted for LE.  Continued with xeroform and compression wrap for edema control, vaseline applied to perimeter to control the itching.   Wound Plan: Continue with current wound care to opening that remains.      Goals    Problem List Patient Active Problem List   Diagnosis Date Noted  . COPD (chronic obstructive pulmonary disease) 01/23/2013  . Anemia 08/03/2012  . Hypertension 08/03/2012  . High cholesterol 08/03/2012  . CAD (coronary artery disease) 08/03/2012  . Blood in stool 08/03/2012    GP    [name] 11/23/2013, 6:46 PM

## 2013-11-26 ENCOUNTER — Ambulatory Visit (HOSPITAL_COMMUNITY)
Admission: RE | Admit: 2013-11-26 | Discharge: 2013-11-26 | Disposition: A | Discharge status: Home / Self Care | Payer: Medicare Other | Source: Ambulatory Visit | Attending: Emergency Medicine | Admitting: Emergency Medicine

## 2013-11-26 NOTE — Progress Notes (Signed)
Physical Therapy Treatment Patient Details  Name: John Sampson MRN: [id number] Date of Birth: 1936-10-26  Today's Date: 11/26/2013 Time: 4098-1191 PT Time Calculation (min): 27 min Charges: Self care x 24'  Visit#: 7 of 8  Re-eval: 12/07/13  Authorization: Medicare  Authorization Visit#: 7 of 10   Subjective: Symptoms/Limitations Symptoms: Pt is pain free and without complaint. Pain Assessment Currently in Pain?: No/denies  Wound Care Location: Superior wound on Lt LE  Granulation: 100%  Slough: 0% Drainage (amount/description): minimal serous  Dressing Used: Xeroform, Vaseline and perimeter, kerlex, coban and #5 netting, anchored with medipore    Wound 2 :  Location: inferior to the first  Healed  Physical Therapy Assessment and Plan PT Assessment and Plan Clinical Impression Statement: Wound has progressed well. Inferior wound is now completely healed. Superior wound is 100% granulated. No debridement necessary. Requested that pt bring compression garments next session. Pt tolerates treatment well. PT Plan: Continue with wound care and compression dressings as needed.      Problem List Patient Active Problem List   Diagnosis Date Noted  . COPD (chronic obstructive pulmonary disease) 01/23/2013  . Anemia 08/03/2012  . Hypertension 08/03/2012  . High cholesterol 08/03/2012  . CAD (coronary artery disease) 08/03/2012  . Blood in stool 08/03/2012    PT - End of Session Activity Tolerance: Patient tolerated treatment well General Behavior During Therapy: [facility] for tasks assessed/performed  Seth Bake, PTA  11/26/2013, 11:06 AM

## 2013-11-28 ENCOUNTER — Ambulatory Visit (HOSPITAL_COMMUNITY)
Admission: RE | Admit: 2013-11-28 | Discharge: 2013-11-28 | Disposition: A | Discharge status: Home / Self Care | Payer: Medicare Other | Source: Ambulatory Visit | Attending: Emergency Medicine | Admitting: Emergency Medicine

## 2013-11-28 NOTE — Progress Notes (Signed)
Physical Therapy Treatment Patient Details  Name: John Sampson MRN: [id number] Date of Birth: 1936-08-31  Today's Date: 11/28/2013 Time: 4696-2952 PT Time Calculation (min): 27 min Charges: Selective debridement (= or < 20 cm)   Visit#: 8 of 8  Re-eval: 12/07/13  Authorization: Medicare  Authorization Visit#: 8 of 10   Subjective: Symptoms/Limitations Symptoms: Pt states that he could not find his compression garments. Pain Assessment Currently in Pain?: No/denies  Exercise/Treatments Wound Care  Location: Superior wound on Lt LE  Granulation: 100%  Slough: 0%  Drainage (amount/description): minimal serous  Dressing Used: Xeroform, Vaseline and perimeter, kerlex, coban and #5 netting, anchored with medipore  Wound 2 :  Location: inferior to the first  Healed   Physical Therapy Assessment and Plan PT Assessment and Plan Clinical Impression Statement: Wound continues to progress well. No debridement necessary, only cleansing and dressing change. Pt tolerates treatment well. Order for compression garments sent to MD. PT Plan: Continue with wound care and compression dressings as needed.     Problem List Patient Active Problem List   Diagnosis Date Noted  . COPD (chronic obstructive pulmonary disease) 01/23/2013  . Anemia 08/03/2012  . Hypertension 08/03/2012  . High cholesterol 08/03/2012  . CAD (coronary artery disease) 08/03/2012  . Blood in stool 08/03/2012    PT - End of Session Activity Tolerance: Patient tolerated treatment well General Behavior During Therapy: [facility] for tasks assessed/performed   Seth Bake, PTA  11/28/2013, 12:42 PM

## 2013-12-03 ENCOUNTER — Telehealth: Payer: Self-pay | Admitting: Cardiovascular Disease

## 2013-12-03 ENCOUNTER — Ambulatory Visit (HOSPITAL_COMMUNITY)
Admission: RE | Admit: 2013-12-03 | Discharge: 2013-12-03 | Disposition: A | Discharge status: Home / Self Care | Payer: Medicare Other | Source: Ambulatory Visit | Attending: Family Medicine | Admitting: Family Medicine

## 2013-12-03 DIAGNOSIS — IMO0001 Reserved for inherently not codable concepts without codable children: Secondary | ICD-10-CM | POA: Diagnosis not present

## 2013-12-03 DIAGNOSIS — J4489 Other specified chronic obstructive pulmonary disease: Secondary | ICD-10-CM | POA: Insufficient documentation

## 2013-12-03 DIAGNOSIS — S91109A Unspecified open wound of unspecified toe(s) without damage to nail, initial encounter: Secondary | ICD-10-CM | POA: Insufficient documentation

## 2013-12-03 DIAGNOSIS — I10 Essential (primary) hypertension: Secondary | ICD-10-CM | POA: Insufficient documentation

## 2013-12-03 DIAGNOSIS — J449 Chronic obstructive pulmonary disease, unspecified: Secondary | ICD-10-CM | POA: Diagnosis not present

## 2013-12-03 NOTE — Progress Notes (Signed)
Physical Therapy Treatment Patient Details  Name: John Sampson MRN: [id number] Date of Birth: [birth year]  Today's Date: 12/03/2013 Time: 0488-8916 PT Time Calculation (min): 27 min Charges: Self care x 25'  Visit#: 9 of 10  Re-eval: 12/07/13  Authorization: Medicare  Authorization Visit#: 9 of 10   Subjective: Symptoms/Limitations Symptoms: Pt states that he spoke with his doctor and she told him that he would need to come to the [place] to get fitted for compression garments. Pain Assessment Currently in Pain?: No/denies  Wound Care  Location: Superior wound on Lt LE  Granulation: 100%  Slough: 0%  Drainage (amount/description): minimal serous  Dressing Used: Xeroform, Vaseline and perimeter, kerlex anchored with medipore Wound 2 :  Location: inferior to the first  Healed   Physical Therapy Assessment and Plan PT Assessment and Plan Clinical Impression Statement: Wound is almost completely healed. Xeroform applied to protect friable tissue and areas was wrapped with kerlex. Pt will most likely be ready for D/C next visit if wound continues to progress well. PT Plan: Reassess next session.      Problem List Patient Active Problem List   Diagnosis Date Noted  . COPD (chronic obstructive pulmonary disease) 01/23/2013  . Anemia 08/03/2012  . Hypertension 08/03/2012  . High cholesterol 08/03/2012  . CAD (coronary artery disease) 08/03/2012  . Blood in stool 08/03/2012    PT - End of Session Activity Tolerance: Patient tolerated treatment well General Behavior During Therapy: [facility] for tasks assessed/performed  Rachelle Hora, PTA 12/03/2013, 1:01 PM

## 2013-12-03 NOTE — Telephone Encounter (Signed)
Returned call and pt verified x 2.  Pt stated he was seen in ER at [facility] and referred to the [facility].  Stated he has seen PT, but not a provider.  Pt informed the Wound Clinic should have a provider that can write a prescription for compression hose if indicated or he will have to come in to our office for evaluation before prescribing.  Pt reminded that Dr. Rollene Fare has retired and he will also need an appt w/ a new cardiologist.  Pt stated he has seen Dr. Gwenlyn Found before and would prefer him.  Pt also stated he will call back for an appt after his last PT appt on Friday.  Stated the [place] will pay for the hose and he could go there, but doesn't have transportation.  Pt agreed w/ plan and will call back for appts.

## 2013-12-03 NOTE — Telephone Encounter (Signed)
Returning Scott call.  Said he is not wearing compression hose at this time.  Please call.

## 2013-12-03 NOTE — Telephone Encounter (Signed)
Need a prescription for compression hose for a wound.

## 2013-12-03 NOTE — Telephone Encounter (Signed)
Returned call.  Left message to call back before 4pm.  

## 2013-12-07 ENCOUNTER — Ambulatory Visit (HOSPITAL_COMMUNITY)
Admission: RE | Admit: 2013-12-07 | Discharge: 2013-12-07 | Disposition: A | Discharge status: Home / Self Care | Payer: Medicare Other | Source: Ambulatory Visit | Attending: Family Medicine | Admitting: Family Medicine

## 2013-12-07 DIAGNOSIS — S91109A Unspecified open wound of unspecified toe(s) without damage to nail, initial encounter: Secondary | ICD-10-CM | POA: Diagnosis not present

## 2013-12-07 DIAGNOSIS — J449 Chronic obstructive pulmonary disease, unspecified: Secondary | ICD-10-CM | POA: Diagnosis not present

## 2013-12-07 DIAGNOSIS — IMO0001 Reserved for inherently not codable concepts without codable children: Secondary | ICD-10-CM | POA: Diagnosis not present

## 2013-12-07 DIAGNOSIS — I10 Essential (primary) hypertension: Secondary | ICD-10-CM | POA: Diagnosis not present

## 2013-12-07 NOTE — Progress Notes (Signed)
Physical Therapy - Wound Therapy/Discharge Summary  Treatment   Patient Details  Name: John Sampson MRN: [id number] Date of Birth: 1936-07-02  Today's Date: 12/07/2013 Time: 1015-1030 Time Calculation (min): 15 min Charges: Self Care: 15' Visit#: 10 of 10  Re-eval:    Subjective Subjective Assessment Subjective: [facility] He is ready for D/C as long as his wound has healined.   Pain Assessment Pain Assessment Pain Assessment: No/denies pain  Wound Therapy Wound 12/21/12 Contact dermatitis;Puncture Leg Left;Lateral (Active)  Site / Wound Assessment Clean;Dry 12/07/2013 10:39 AM  % Wound base Red or Granulating 100% 06/12/2013  9:34 AM  % Wound base Yellow 0% 06/12/2013  9:34 AM  % Wound base Black 0% 04/03/2013  4:35 PM  % Wound base Other (Comment) 0% 03/06/2013  4:22 PM  Peri-wound Assessment Intact 04/25/2013 12:03 PM  Wound Length (cm) 0 cm 12/07/2013 10:39 AM  Wound Width (cm) 0 cm 12/07/2013 10:39 AM  Wound Depth (cm) 0 cm 12/07/2013 10:39 AM  Closure None 04/25/2013 12:03 PM  Drainage Amount Minimal 06/05/2013  9:36 AM  Drainage Description Serous 06/05/2013  9:36 AM  Non-staged Wound Description Not applicable 12/04/6061  0:16 AM  Treatment Cleansed 06/05/2013  9:36 AM  Dressing Type Impregnated gauze (petrolatum);Gauze (Comment) 06/05/2013  9:36 AM  Dressing Changed Changed 06/05/2013  9:36 AM  Dressing Status Clean;Dry;Intact 06/12/2013  9:34 AM     Wound 12/21/12 Puncture Toe (Comment  which one) Right great toe  (Active)  Site / Wound Assessment Granulation tissue 02/21/2013  3:56 PM  % Wound base Red or Granulating 100% 02/21/2013  3:56 PM  % Wound base Yellow 0% 02/21/2013  3:56 PM  % Wound base Black 0% 02/13/2013  4:42 PM  % Wound base Other (Comment) 0% 02/13/2013  4:42 PM  Peri-wound Assessment Edema;Erythema (blanchable) 01/08/2013 11:58 AM  Wound Length (cm) 0.1 cm 02/16/2013 11:00 AM  Wound Width (cm) 0.1  cm 02/16/2013 11:00 AM  Margins Attached edges (approximated) 12/25/2012  9:30 AM  Closure None 02/21/2013  3:56 PM  Drainage Amount None 02/21/2013  3:56 PM  Drainage Description Serous 02/13/2013  4:42 PM  Treatment Cleansed;Debridement (Selective) 02/21/2013  3:56 PM  Dressing Type Impregnated gauze (bismuth);Gauze (Comment) 02/21/2013  3:56 PM  Dressing Changed New 02/13/2013  4:42 PM  Dressing Status Clean;Dry;Intact 02/13/2013  4:42 PM     Incision 12/31/11 Face Other (Comment) (Active)     Incision 12/31/11 Abdomen Other (Comment) (Active)       Physical Therapy Assessment and Plan Wound Therapy - Assess/Plan/Recommendations Wound Therapy - Clinical Statement: Pt is 100% healed.  Educated and provided pt with supplies to continue with at home.  Will d/c from skilled PT.  Wound Plan: d/c      Goals Wound Therapy Goals - Improve the function of patient's integumentary system by progressing the wound(s) through the phases of wound healing by: Decrease Necrotic Tissue to: 0 Decrease Necrotic Tissue - Progress: Met Increase Granulation Tissue to: 100 Increase Granulation Tissue - Progress: Met Decrease Length/Width/Depth - Progress: Met Improve Drainage Characteristics: Min Improve Drainage Characteristics - Progress: Met Patient/Family will be able to : educated in care and dressing changes  (however unable to care independently) Patient/Family Instruction Goal - Progress: Met  Problem List Patient Active Problem List   Diagnosis Date Noted  . COPD (chronic obstructive pulmonary disease) 01/23/2013  . Anemia 08/03/2012  . Hypertension 08/03/2012  .  High cholesterol 08/03/2012  . CAD (coronary artery disease) 08/03/2012  . Blood in stool 08/03/2012    GP Functional Limitation: Other PT primary Other PT Primary Goal Status (G3875): 0 percent impaired, limited or restricted Other PT Primary Discharge Status (I4332): 0 percent impaired, limited or restricted  Zacarias Krauter, MTP,  ATC 12/07/2013, 10:43 AM

## 2013-12-10 ENCOUNTER — Ambulatory Visit (HOSPITAL_COMMUNITY): Payer: Medicare Other | Admitting: *Deleted

## 2013-12-14 ENCOUNTER — Ambulatory Visit (HOSPITAL_COMMUNITY): Payer: Medicare Other

## 2013-12-17 ENCOUNTER — Ambulatory Visit (HOSPITAL_COMMUNITY): Payer: Medicare Other | Admitting: *Deleted

## 2013-12-19 DIAGNOSIS — M79609 Pain in unspecified limb: Secondary | ICD-10-CM | POA: Diagnosis not present

## 2013-12-19 DIAGNOSIS — Q828 Other specified congenital malformations of skin: Secondary | ICD-10-CM | POA: Diagnosis not present

## 2013-12-19 DIAGNOSIS — M25579 Pain in unspecified ankle and joints of unspecified foot: Secondary | ICD-10-CM | POA: Diagnosis not present

## 2013-12-21 ENCOUNTER — Ambulatory Visit (HOSPITAL_COMMUNITY): Payer: Medicare Other

## 2013-12-24 ENCOUNTER — Ambulatory Visit (HOSPITAL_COMMUNITY): Payer: Medicare Other | Admitting: *Deleted

## 2013-12-28 ENCOUNTER — Ambulatory Visit (HOSPITAL_COMMUNITY): Payer: Medicare Other

## 2014-04-15 ENCOUNTER — Emergency Department (HOSPITAL_COMMUNITY)
Admission: EM | Admit: 2014-04-15 | Discharge: 2014-04-15 | Disposition: A | Discharge status: Home / Self Care | Payer: Medicare Other | Attending: Emergency Medicine | Admitting: Emergency Medicine

## 2014-04-15 ENCOUNTER — Encounter (HOSPITAL_COMMUNITY): Payer: Self-pay | Admitting: Emergency Medicine

## 2014-04-15 DIAGNOSIS — I10 Essential (primary) hypertension: Secondary | ICD-10-CM | POA: Insufficient documentation

## 2014-04-15 DIAGNOSIS — Z87891 Personal history of nicotine dependence: Secondary | ICD-10-CM | POA: Insufficient documentation

## 2014-04-15 DIAGNOSIS — I2510 Atherosclerotic heart disease of native coronary artery without angina pectoris: Secondary | ICD-10-CM | POA: Diagnosis not present

## 2014-04-15 DIAGNOSIS — S01309A Unspecified open wound of unspecified ear, initial encounter: Secondary | ICD-10-CM | POA: Insufficient documentation

## 2014-04-15 DIAGNOSIS — Z7982 Long term (current) use of aspirin: Secondary | ICD-10-CM | POA: Diagnosis not present

## 2014-04-15 DIAGNOSIS — Y9389 Activity, other specified: Secondary | ICD-10-CM | POA: Insufficient documentation

## 2014-04-15 DIAGNOSIS — Y929 Unspecified place or not applicable: Secondary | ICD-10-CM | POA: Insufficient documentation

## 2014-04-15 DIAGNOSIS — W278XXA Contact with other nonpowered hand tool, initial encounter: Secondary | ICD-10-CM | POA: Insufficient documentation

## 2014-04-15 DIAGNOSIS — I252 Old myocardial infarction: Secondary | ICD-10-CM | POA: Diagnosis not present

## 2014-04-15 DIAGNOSIS — Z79899 Other long term (current) drug therapy: Secondary | ICD-10-CM | POA: Diagnosis not present

## 2014-04-15 DIAGNOSIS — Z951 Presence of aortocoronary bypass graft: Secondary | ICD-10-CM | POA: Insufficient documentation

## 2014-04-15 DIAGNOSIS — J441 Chronic obstructive pulmonary disease with (acute) exacerbation: Secondary | ICD-10-CM | POA: Diagnosis not present

## 2014-04-15 DIAGNOSIS — K219 Gastro-esophageal reflux disease without esophagitis: Secondary | ICD-10-CM | POA: Insufficient documentation

## 2014-04-15 DIAGNOSIS — S01302A Unspecified open wound of left ear, initial encounter: Secondary | ICD-10-CM

## 2014-04-15 MED ORDER — CEFTRIAXONE SODIUM 1 G IJ SOLR
1.0000 g | Freq: Once | INTRAMUSCULAR | Status: AC
  Administered 2014-04-15: 1 g via INTRAMUSCULAR
  Filled 2014-04-15: qty 10

## 2014-04-15 MED ORDER — SULFAMETHOXAZOLE-TRIMETHOPRIM 800-160 MG PO TABS
1.0000 | ORAL_TABLET | Freq: Two times a day (BID) | ORAL | Status: AC
Start: 2014-04-15 — End: 2014-04-22

## 2014-04-15 MED ORDER — BACITRACIN-NEOMYCIN-POLYMYXIN 400-5-5000 EX OINT
TOPICAL_OINTMENT | Freq: Once | CUTANEOUS | Status: AC
  Administered 2014-04-15: 1 via TOPICAL
  Filled 2014-04-15: qty 1

## 2014-04-15 NOTE — Discharge Instructions (Signed)
Please apply Neosporin, or triple antibiotic ointment to the wounds appeared ear daily. Please use Bactrim 2 times daily with food. Someone from the [facility] rehabilitation and wound center will call you with an appointment. Please see your doctor at the [facility] clinic, or return to the emergency department if the infection is advancing.

## 2014-04-15 NOTE — ED Provider Notes (Signed)
CSN: [id number]     Arrival date & time 04/15/14  3557 History   First MD Initiated Contact with Patient 04/15/14 [phone]     Chief Complaint  Patient presents with  . Wound Check     (Consider location/radiation/quality/duration/timing/severity/associated sxs/prior Treatment) HPI Comments: Patient is a 78 year old male veteran who presents to the emergency department with complaint of a wound on his ear. The patient states that approximately 3 weeks ago he thinks that his barber may have nicked his ear. He says about a week after that he began to notice some drainage from his left year. He has been putting Band-Aids on it but it seems as though the wound is not healing. The patient states that he has had problems with wounds particularly of his lower extremities in the past and has required treatment from the" wound center". He request evaluation and referral to the wound center because of his ear. He is seen by the [facility] clinic in [place], and states that they cannot or will not give him a referral to the wound care center. He has not had any fever or chills. He denies being diabetic. He has no other wounds to be reported.  Patient is a 78 y.o. male presenting with wound check. The history is provided by the patient.  Wound Check Associated symptoms include headaches. Pertinent negatives include no abdominal pain, arthralgias, chest pain, coughing or neck pain.    Past Medical History  Diagnosis Date  . Shortness of breath   . COPD (chronic obstructive pulmonary disease)   . Hypertension   . GERD (gastroesophageal reflux disease)   . Headache(784.0)   . Coronary artery disease     SEE DR Rollene Fare...  . Myocardial infarction    Past Surgical History  Procedure Laterality Date  . Coronary artery bypass graft      2003  X  5 GRAFTS  . Eye surgery      06/2011  . Tonsillectomy      AGE 56  . Craniotomy  12/31/2011    Procedure: CRANIOTOMY HYPOPHYSECTOMY  TRANSNASAL APPROACH;  Surgeon: [name], MD;  Location: [place] [facility];  Service: Neurosurgery;  Laterality: N/A;  Transphenoidal Hypophysectomy With Fat Graft Harvest from right abdomen Dr. Radene Journey to assist   . Colonoscopy  08/18/2012    Procedure: COLONOSCOPY;  Surgeon: [name], MD;  Location: [facility]O [facility];  Service: Endoscopy;  Laterality: N/A;  1:25  . Esophagogastroduodenoscopy  08/18/2012    Procedure: ESOPHAGOGASTRODUODENOSCOPY (EGD);  Surgeon: [name], MD;  Location: [facility] ENDO SU[facility];  Service: Endoscopy;  Laterality: N/A;  . Givens capsule study N/A 01/25/2013    Procedure: GIVENS CAPSULE STUDY;  Surgeon: [name], MD;  Location: [facility] ENDO SUITE;  Service: Endoscopy;  Laterality: N/A;  730   No family history on file. History  Substance Use Topics  . Smoking status: Former Smoker -- 3.00 packs/day for 45 years    Quit date: 01/23/2002  . Smokeless tobacco: Not on file     Comment: Quit 11 yrs ago  . Alcohol Use: No    Review of Systems  Constitutional: Negative for activity change.       All ROS Neg except as noted in HPI  HENT: Negative for facial swelling.   Eyes: Negative for photophobia and discharge.  Respiratory: Positive for shortness of breath. Negative for cough and wheezing.   Cardiovascular: Negative for chest pain and palpitations.  Gastrointestinal: Negative for abdominal  pain and blood in stool.  Genitourinary: Negative for dysuria, frequency and hematuria.  Musculoskeletal: Negative for arthralgias, back pain and neck pain.  Skin: Positive for wound.  Neurological: Positive for headaches. Negative for dizziness, seizures and speech difficulty.  Psychiatric/Behavioral: Negative for hallucinations and confusion.      Allergies  Review of patient's allergies indicates no known allergies.  Home Medications   Prior to Admission medications   Medication Sig Start Date End Date Taking? Authorizing Provider  albuterol-ipratropium  (COMBIVENT) 18-103 MCG/ACT inhaler Inhale 2 puffs into the lungs daily.     Historical Provider, MD  aspirin EC 81 MG tablet Take 162 mg by mouth daily.    Historical Provider, MD  diltiazem (TIAZAC) 180 MG 24 hr capsule Take 180 mg by mouth daily.    Historical Provider, MD  ferrous sulfate 325 (65 FE) MG tablet Take 1 tablet (325 mg total) by mouth 2 (two) times daily with a meal. 01/23/13   [name], MD  fish oil-omega-3 fatty acids 1000 MG capsule Take 2 g by mouth daily.    Historical Provider, MD  folic acid (FOLVITE) 1 MG tablet Take 2 mg by mouth daily.    Historical Provider, MD  furosemide (LASIX) 40 MG tablet Take 40 mg by mouth daily as needed for fluid.     Historical Provider, MD  lisinopril (PRINIVIL,ZESTRIL) 40 MG tablet Take 20 mg by mouth daily.    Historical Provider, MD  metoprolol succinate (TOPROL-XL) 50 MG 24 hr tablet Take 25 mg by mouth daily. Take with or immediately following a meal.    Historical Provider, MD  Multiple Vitamin (MULTIVITAMIN) tablet Take 1 tablet by mouth daily.    Historical Provider, MD  pantoprazole (PROTONIX) 40 MG tablet Take 40 mg by mouth daily.    Historical Provider, MD  potassium chloride (MICRO-K) 10 MEQ CR capsule Take 10 mEq by mouth 2 (two) times daily.     Historical Provider, MD  simvastatin (ZOCOR) 40 MG tablet Take 40 mg by mouth every evening.    Historical Provider, MD   BP 187/59  Pulse 67  Temp(Src) 97.9 F (36.6 C)  Resp 20  Ht 5\' 10"  (1.778 m)  Wt 230 lb (104.327 kg)  BMI 33.00 kg/m2  SpO2 94% Physical Exam  Nursing note and vitals reviewed. Constitutional: He is oriented to person, place, and time. He appears well-developed and well-nourished.  Non-toxic appearance.  HENT:  Head: Normocephalic.  Right Ear: Tympanic membrane and external ear normal.  Left Ear: Tympanic membrane and external ear normal.  There is an open wound with mild drainage at the helix of the left year. There is mild to moderate increased  redness present. There no preauricular or post auricular nodes appreciated. The external auditory canal is noninvolved.  Eyes: EOM and lids are normal. Pupils are equal, round, and reactive to light.  Neck: Normal range of motion. Neck supple. Carotid bruit is not present.  Cardiovascular: Normal rate, regular rhythm, normal heart sounds, intact distal pulses and normal pulses.   Pulmonary/Chest: Breath sounds normal. No respiratory distress.  Abdominal: Soft. Bowel sounds are normal. There is no tenderness. There is no guarding.  Musculoskeletal: Normal range of motion.  Lymphadenopathy:       Head (right side): No submandibular adenopathy present.       Head (left side): No submandibular adenopathy present.    He has no cervical adenopathy.  Neurological: He is alert and oriented to person, place, and time.  He has normal strength. No cranial nerve deficit or sensory deficit.  Skin: Skin is warm and dry.  Psychiatric: He has a normal mood and affect. His speech is normal.    ED Course  Procedures (including critical care time) Labs Review Labs Reviewed - No data to display  Imaging Review No results found.   EKG Interpretation None      MDM The patient has a wound to the left year that he says is very slow to heal by conservative measures. The patient requests assistance with this problem. The vital signs are within normal limits with exception of the blood pressure being 187/59. The patient was treated in the emergency department with a Neosporin dressing and a gram of Rocephin intramuscularly. The patient will be placed on Septra twice a day and a referral given to the wound center.    Final diagnoses:  None    **I A resolved and it with you when is mostly in a breath all the legs and a have reviewed nursing notes, vital signs, and all appropriate lab and imaging results for this patient.Lenox Ahr, PA-C 04/15/14 [phone]

## 2014-04-15 NOTE — ED Notes (Signed)
Pt alert & oriented x4, stable gait. Patient given discharge instructions, paperwork & prescription(s). Patient  instructed to stop at the registration desk to finish any additional paperwork. Patient verbalized understanding. Pt left department w/ no further questions. 

## 2014-04-15 NOTE — ED Provider Notes (Signed)
Medical screening examination/treatment/procedure(s) were conducted as a shared visit with non-physician practitioner(s) and myself.  I personally evaluated the patient during the encounter.   EKG Interpretation None      Pt with bleeding from ear.  Pe.  Abrasion left ear  Maudry Diego, MD 04/15/14 [phone]

## 2014-04-15 NOTE — ED Notes (Signed)
Pt states he needs a referral to go to the wound clinic. States he has a nick to his left ear that will not heal

## 2014-04-16 ENCOUNTER — Ambulatory Visit (HOSPITAL_COMMUNITY)
Admission: RE | Admit: 2014-04-16 | Discharge: 2014-04-16 | Disposition: A | Discharge status: Home / Self Care | Payer: Medicare Other | Source: Ambulatory Visit | Attending: Physician Assistant | Admitting: Physician Assistant

## 2014-04-16 DIAGNOSIS — IMO0001 Reserved for inherently not codable concepts without codable children: Secondary | ICD-10-CM | POA: Insufficient documentation

## 2014-04-16 DIAGNOSIS — J449 Chronic obstructive pulmonary disease, unspecified: Secondary | ICD-10-CM | POA: Insufficient documentation

## 2014-04-16 DIAGNOSIS — I10 Essential (primary) hypertension: Secondary | ICD-10-CM | POA: Diagnosis not present

## 2014-04-16 DIAGNOSIS — S01309A Unspecified open wound of unspecified ear, initial encounter: Secondary | ICD-10-CM | POA: Insufficient documentation

## 2014-04-16 DIAGNOSIS — J4489 Other specified chronic obstructive pulmonary disease: Secondary | ICD-10-CM | POA: Diagnosis not present

## 2014-04-16 NOTE — Progress Notes (Addendum)
Physical Therapy - Wound Therapy  Evaluation   Patient Details  Name: ANIKIN PROSSER MRN: [id number] Date of Birth: 1936-07-01  Today's Date: 04/16/2014 Time: 1515-1600 Time Calculation (min): 45 min  1 Evaluation, 1540-1555 Debridement  Visit#: 1 of 6  Re-eval: 05/16/14  Subjective Subjective Assessment Subjective: Patient has no pain, Believes barber cut ear with scissors Patient and Family Stated Goals: For wound to heal Date of Onset: 04/13/14 Prior Treatments: no  Pain Assessment Pain Assessment Pain Assessment: No/denies pain Pain Score: 0-No pain  Wound Therapy Wound 12/21/12 Contact dermatitis;Puncture Leg Left;Lateral (Active)     Wound 12/21/12 Puncture Toe (Comment  which one) Right great toe  (Active)     Wound / Incision (Open or Dehisced) 04/16/14 (Active)  Dressing Type Tape dressing;Gauze (Comment) 04/16/2014  7:19 PM  Dressing Changed New 04/16/2014  7:19 PM  Dressing Status Clean 04/16/2014  7:19 PM  Dressing Change Frequency Monday, Wednesday, Friday 04/16/2014  7:19 PM  Site / Wound Assessment Dry 04/16/2014  7:19 PM  % Wound base Red or Granulating 100% 04/16/2014  7:19 PM  % Wound base Yellow 0% 04/16/2014  7:19 PM  % Wound base Black 0% 04/16/2014  7:19 PM  Peri-wound Assessment Intact 04/16/2014  7:19 PM  Wound Length (cm) 1 cm 04/16/2014  7:19 PM  Wound Width (cm) 1 cm 04/16/2014  7:19 PM  Wound Depth (cm) 0 cm 04/16/2014  7:19 PM  Tunneling (cm) no 04/16/2014  7:19 PM  Undermining (cm) no 04/16/2014  7:19 PM  Margins Unattached edges (unapproximated) 04/16/2014  7:19 PM  Closure None 04/16/2014  7:19 PM  Drainage Amount Scant 04/16/2014  7:19 PM  Drainage Description Serous 04/16/2014  7:19 PM  Treatment Cleansed;Debridement (Selective);Tape changed 04/16/2014  7:19 PM     Incision 12/31/11 Face Other (Comment) (Active)     Incision 12/31/11 Abdomen Other (Comment) (Active)   Selective Debridement Selective Debridement - Location: Lt Ear Selective  Debridement - Tools Used: Forceps;Scalpel;Scissors Selective Debridement - Tissue Removed: all slaugh and eschar removed   Physical Therapy Assessment and Plan Wound Therapy - Assess/Plan/Recommendations Wound Therapy - Clinical Statement: Patient displays good healing and is expected to make a full recovery in 2 weeks. Patient will be seen 3x weekly for 2 weeks.  Wound Therapy - Functional Problem List: Difficulty hearing due to dressing Hydrotherapy Plan: Debridement;Dressing change;Patient/family education Wound Therapy - Frequency: 3X / week Wound Plan: COntineu wound care 3x a week for 2 weeks to contineu to moitor wound and change dressings.       Goals Wound Therapy Goals - Improve the function of patient's integumentary system by progressing the wound(s) through the phases of wound healing by: Decrease Necrotic Tissue to: 0% Decrease Necrotic Tissue - Progress: Goal set today Increase Granulation Tissue to: 100% Increase Granulation Tissue - Progress: Goal set today Decrease Length/Width/Depth by (cm): 1x1cm Decrease Length/Width/Depth - Progress: Goal set today Improve Drainage Characteristics: Min Improve Drainage Characteristics - Progress: Goal set today Goals/treatment plan/discharge plan were made with and agreed upon by patient/family: Yes Time For Goal Achievement: 2 weeks Wound Therapy - Potential for Goals: Excellent  Problem List Patient Active Problem List   Diagnosis Date Noted  . COPD (chronic obstructive pulmonary disease) 01/23/2013  . Anemia 08/03/2012  . Hypertension 08/03/2012  . High cholesterol 08/03/2012  . CAD (coronary artery disease) 08/03/2012  . Blood in stool 08/03/2012   Functional Limitation: Other PT primary  Other PT Primary Current Status (K2706): CJ At least  20 percent but less than 40 percent impaired, limited or restricted  Other PT Primary Goal Status (S8270): CI  At least 1 percent but less than 20 percent impaired, limited or  restricted  Other PT Primary Discharge Status (B8675): [place] 0 percent impaired, limited or restricted  Ling Flesch R Brittinee Risk 04/16/2014, 7:29 PM

## 2014-04-18 ENCOUNTER — Inpatient Hospital Stay (HOSPITAL_COMMUNITY)
Admission: RE | Admit: 2014-04-18 | Discharge: 2014-04-18 | Disposition: A | Discharge status: Home / Self Care | Payer: Medicare Other | Source: Ambulatory Visit | Attending: *Deleted | Admitting: *Deleted

## 2014-04-18 NOTE — Progress Notes (Addendum)
Physical Therapy - Wound Therapy  Discharge Summary   Patient Details  Name: John Sampson MRN: [id number] Date of Birth: 1936-03-25  Today's Date: 04/18/2014 Time: 4010-2725 Time Calculation (min): 10 min Charges: No charge  Visit#: 2 of 6  Re-eval: 05/16/14  Subjective Subjective Assessment Subjective: Pt is pain free and without complaint.  Pain Assessment Pain Assessment Pain Assessment: No/denies pain  Wound Therapy           Wound / Incision (Open or Dehisced) 04/16/14 (Active)  Dressing Type Tape dressing;Gauze (Comment) 04/18/2014  4:49 PM  Dressing Changed New 04/16/2014  7:19 PM  Dressing Status Clean 04/18/2014  4:49 PM      Site / Wound Assessment Clean, dry 04/18/2014  4:49 PM  % Wound base Red or Granulating 100% 04/16/2014  7:19 PM  % Wound base Yellow 0% 04/16/2014  7:19 PM  % Wound base Black 0% 04/16/2014  7:19 PM  Peri-wound Assessment Intact 04/16/2014  7:19 PM  Wound Length (cm) 0 cm 04/16/2014  7:19 PM  Wound Width (cm) 0 cm 04/16/2014  7:19 PM  Wound Depth (cm) 0 cm 04/16/2014  7:19 PM  Tunneling (cm) no 04/16/2014  7:19 PM  Undermining (cm) no 04/16/2014  7:19 PM          Drainage Amount None 04/18/2014  4:49 PM  Drainage Description Serous 04/16/2014  7:19 PM  Treatment Cleansed 04/18/2014  4:49 PM             Selective Debridement Selective Debridement - Location: None needed   Physical Therapy Assessment and Plan Wound Therapy - Assess/Plan/Recommendations Wound Therapy - Clinical Statement: Wound is healed. No further wound care is necessary. Pt verbalizes the importance of checking skin for any changes. Wound Plan: Recommend D/C for wound care.      Goals Wound Therapy Goals - Improve the function of patient's integumentary system by progressing the wound(s) through the phases of wound healing by: Decrease Necrotic Tissue to: 0% Decrease Necrotic Tissue - Progress: Met Increase Granulation Tissue to: 100% Increase Granulation Tissue -  Progress: Met Decrease Length/Width/Depth by (cm): 1x1cm Decrease Length/Width/Depth - Progress: Met Improve Drainage Characteristics: Min Improve Drainage Characteristics - Progress: Met  Problem List Patient Active Problem List   Diagnosis Date Noted  . COPD (chronic obstructive pulmonary disease) 01/23/2013  . Anemia 08/03/2012  . Hypertension 08/03/2012  . High cholesterol 08/03/2012  . CAD (coronary artery disease) 08/03/2012  . Blood in stool 08/03/2012   Rachelle Hora, PTA 04/18/2014, 4:55 PM

## 2014-04-19 NOTE — Progress Notes (Addendum)
Physical Therapy - Wound Therapy  Discharge Summary   Patient Details  Name: John Sampson MRN: [id number] Date of Birth: 1936/07/31  Today's Date: 04/18/2014 Time: 7711-6579 Time Calculation (min): 10 min Charges: No charge  Visit#: 2 of 6  Re-eval: 05/16/14  Subjective Subjective Assessment Subjective: Pt is pain free and without complaint.  Pain Assessment Pain Assessment Pain Assessment: No/denies pain  Wound Therapy           Wound / Incision (Open or Dehisced) 04/16/14 (Active)  Dressing Type Tape dressing;Gauze (Comment) 04/18/2014  4:49 PM  Dressing Changed New 04/16/2014  7:19 PM  Dressing Status Clean 04/18/2014  4:49 PM      Site / Wound Assessment Clean, dry 04/18/2014  4:49 PM  % Wound base Red or Granulating 100% 04/16/2014  7:19 PM  % Wound base Yellow 0% 04/16/2014  7:19 PM  % Wound base Black 0% 04/16/2014  7:19 PM  Peri-wound Assessment Intact 04/16/2014  7:19 PM  Wound Length (cm) 0 cm 04/16/2014  7:19 PM  Wound Width (cm) 0 cm 04/16/2014  7:19 PM  Wound Depth (cm) 0 cm 04/16/2014  7:19 PM  Tunneling (cm) no 04/16/2014  7:19 PM  Undermining (cm) no 04/16/2014  7:19 PM          Drainage Amount None 04/18/2014  4:49 PM  Drainage Description Serous 04/16/2014  7:19 PM  Treatment Cleansed 04/18/2014  4:49 PM             Selective Debridement Selective Debridement - Location: None needed   Physical Therapy Assessment and Plan Wound Therapy - Assess/Plan/Recommendations Wound Therapy - Clinical Statement: Wound is healed. No further wound care is necessary. Pt verbalizes the importance of checking skin for any changes. Wound Plan: Recommend D/C for wound care.      Goals Wound Therapy Goals - Improve the function of patient's integumentary system by progressing the wound(s) through the phases of wound healing by: Decrease Necrotic Tissue to: 0% Decrease Necrotic Tissue - Progress: Met Increase Granulation Tissue to: 100% Increase Granulation Tissue -  Progress: Met Decrease Length/Width/Depth by (cm): 1x1cm Decrease Length/Width/Depth - Progress: Met Improve Drainage Characteristics: Min Improve Drainage Characteristics - Progress: Met  Problem List Patient Active Problem List   Diagnosis Date Noted  . COPD (chronic obstructive pulmonary disease) 01/23/2013  . Anemia 08/03/2012  . Hypertension 08/03/2012  . High cholesterol 08/03/2012  . CAD (coronary artery disease) 08/03/2012  . Blood in stool 08/03/2012   Functional Limitation: Other PT primary Other PT Primary Current Status (U3833): At least 20 percent but less than 40 percent impaired, limited or restricted Other PT Primary Goal Status (X8329): At least 1 percent but less than 20 percent impaired, limited or restricted Other PT Primary Discharge Status [phone]): 0 percent impaired, limited or restricted  Rachelle Hora, PTA 04/18/2014, 4:55 PM  Devona Konig PT DPT

## 2014-04-23 ENCOUNTER — Ambulatory Visit (HOSPITAL_COMMUNITY): Payer: Medicare Other | Admitting: *Deleted

## 2014-04-24 ENCOUNTER — Ambulatory Visit (HOSPITAL_COMMUNITY): Payer: Medicare Other | Admitting: *Deleted

## 2014-04-26 ENCOUNTER — Ambulatory Visit (HOSPITAL_COMMUNITY): Payer: Medicare Other | Admitting: Physical Therapy

## 2014-04-29 ENCOUNTER — Ambulatory Visit (HOSPITAL_COMMUNITY): Payer: Medicare Other | Admitting: Physical Therapy

## 2014-05-01 ENCOUNTER — Ambulatory Visit (HOSPITAL_COMMUNITY): Payer: Medicare Other | Admitting: Physical Therapy

## 2014-05-03 ENCOUNTER — Ambulatory Visit (HOSPITAL_COMMUNITY): Payer: Medicare Other

## 2014-05-06 DIAGNOSIS — R040 Epistaxis: Secondary | ICD-10-CM | POA: Diagnosis not present

## 2014-06-04 NOTE — Addendum Note (Signed)
Encounter addended by: Leia Alf, PT on: 06/04/2014  2:26 PM<BR>     Documentation filed: Arn Medal VN

## 2014-06-04 NOTE — Addendum Note (Signed)
Encounter addended by: Leia Alf, PT on: 06/04/2014  2:25 PM<BR>     Documentation filed: Clinical Notes

## 2014-06-06 NOTE — Addendum Note (Signed)
Encounter addended by: Leia Alf, PT on: 06/06/2014  2:03 PM<BR>     Documentation filed: Clinical Notes

## 2014-06-07 ENCOUNTER — Ambulatory Visit (INDEPENDENT_AMBULATORY_CARE_PROVIDER_SITE_OTHER): Payer: Medicare Other | Admitting: Cardiology

## 2014-06-07 ENCOUNTER — Encounter: Payer: Self-pay | Admitting: Cardiology

## 2014-06-07 VITALS — BP 162/82 | HR 59 | Ht 69.0 in | Wt 227.0 lb

## 2014-06-07 DIAGNOSIS — I739 Peripheral vascular disease, unspecified: Secondary | ICD-10-CM | POA: Diagnosis not present

## 2014-06-07 DIAGNOSIS — E782 Mixed hyperlipidemia: Secondary | ICD-10-CM

## 2014-06-07 DIAGNOSIS — J449 Chronic obstructive pulmonary disease, unspecified: Secondary | ICD-10-CM

## 2014-06-07 DIAGNOSIS — I10 Essential (primary) hypertension: Secondary | ICD-10-CM | POA: Diagnosis not present

## 2014-06-07 DIAGNOSIS — I2510 Atherosclerotic heart disease of native coronary artery without angina pectoris: Secondary | ICD-10-CM

## 2014-06-07 MED ORDER — NITROGLYCERIN 0.4 MG SL SUBL: 0.4000 mg | SUBLINGUAL_TABLET | SUBLINGUAL | Status: DC | PRN

## 2014-06-07 MED ORDER — ALBUTEROL SULFATE HFA 108 (90 BASE) MCG/ACT IN AERS: 2.0000 | INHALATION_SPRAY | Freq: Four times a day (QID) | RESPIRATORY_TRACT | Status: DC | PRN

## 2014-06-07 NOTE — Assessment & Plan Note (Signed)
Keep follow with primary care. We did provide a refill for Proventil MDI.

## 2014-06-07 NOTE — Assessment & Plan Note (Signed)
Symptomatically stable on medical therapy. We will request most recent office records from Dr. Rollene Fare including stress test results from last year. ECG reviewed today. No changes were made in medical regimen. He was given refills for fresh nitroglycerin. Followup arranged in 9 months.

## 2014-06-07 NOTE — Progress Notes (Signed)
Clinical Summary John Sampson is a 78 y.o.male former patient of Dr. Rollene Fare, presenting to establish cardiology followup. I do not have any detailed cardiac records other than what was found in Epic. 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 Reports no angina symptoms or nitroglycerin use. We reviewed his medications.  Echocardiogram from March 2014 showed mildly dilated LV with mild LVH, LVEF 50-55% with inferior hypokinesis, grade 1 diastolic dysfunction, mild mitral regurgitation, mild biatrial enlargement. Carotid Dopplers from September 2014 demonstrated less than 50% bilateral ICA stenoses. He tells me that he had a stress test last year with Dr. Rollene Fare.  Lab work from September 2014 showed hemoglobin 14.3, platelets 245, potassium 4.1, BUN 14, creatinine 1.0, normal LFTs, cholesterol 184, triglycerides 108, HDL 44, LDL 118, TSH 2.3, hemoglobin A1c 5.8. Typically lab work is done to the [facility] system.  Today we discussed continued observation on medical therapy, recommended a basic walking regimen for exercise. He does not endorse any claudication and states that previous leg wounds have healed.  ECG shows sinus rhythm with LVH and repolarization abnormalities, possible old inferior infarct pattern.  No Known Allergies  Current Outpatient Prescriptions  Medication Sig Dispense Refill  . acetaminophen (TYLENOL) 325 MG tablet Take 650 mg by mouth daily as needed for headache.      . albuterol (PROVENTIL HFA) 108 (90 BASE) MCG/ACT inhaler Inhale 2 puffs into the lungs every 6 (six) hours as needed for wheezing or shortness of breath.  1 Inhaler  6  . albuterol-ipratropium (COMBIVENT) 18-103 MCG/ACT inhaler Inhale 2 puffs into the lungs daily.       [name] aspirin EC 81 MG tablet Take 162 mg by mouth daily.      [name] diltiazem (TIAZAC) 180 MG 24 hr capsule Take 180 mg by mouth daily.      . ferrous sulfate 325 (65 FE) MG tablet Take 1 tablet (325 mg total) by mouth 2 (two) times  daily with a meal.      . fish oil-omega-3 fatty acids 1000 MG capsule Take 2 g by mouth daily.      . folic acid (FOLVITE) 1 MG tablet Take 1 mg by mouth daily.       . furosemide (LASIX) 40 MG tablet Take 40 mg by mouth daily as needed for fluid.       [name] lisinopril (PRINIVIL,ZESTRIL) 40 MG tablet Take 20 mg by mouth daily.      . metoprolol succinate (TOPROL-XL) 50 MG 24 hr tablet Take 25 mg by mouth daily. Take with or immediately following a meal.      . Multiple Vitamin (MULTIVITAMIN) tablet Take 1 tablet by mouth daily.      . nitroGLYCERIN (NITROSTAT) 0.4 MG SL tablet Place 1 tablet (0.4 mg total) under the tongue every 5 (five) minutes as needed for chest pain.  30 tablet  9  . omeprazole (PRILOSEC) 20 MG capsule Take 20 mg by mouth 2 (two) times daily before a meal.      . pantoprazole (PROTONIX) 40 MG tablet Take 40 mg by mouth 2 (two) times daily.       . potassium chloride (MICRO-K) 10 MEQ CR capsule Take 10 mEq by mouth daily.       . simvastatin (ZOCOR) 40 MG tablet Take 20 mg by mouth every evening.        No current facility-administered medications for this visit.    Past Medical History  Diagnosis Date  .  COPD (chronic obstructive pulmonary disease)   . Essential hypertension, benign   . GERD (gastroesophageal reflux disease)   . Headache(784.0)   . Coronary atherosclerosis of native coronary artery     Multivessel status post CABG  . Myocardial infarction   . PAD (peripheral artery disease)     Moderate bilateral SFA disease at angiography 01/2013  . Gastric ulcer     Small - nonbleeding  . Iron deficiency anemia     Negative Givens capsule study   . Pituitary macroadenoma     Past Surgical History  Procedure Laterality Date  . Coronary artery bypass graft  2003    5 grafts - details not clear  . Eye surgery  2012  . Tonsillectomy  Age 83  . Craniotomy  12/31/2011    Procedure: CRANIOTOMY HYPOPHYSECTOMY TRANSNASAL APPROACH;  Surgeon: [name], MD;  Location:  [place] [facility];  Service: Neurosurgery;  Laterality: N/A;  Transphenoidal Hypophysectomy With Fat Graft Harvest from right abdomen   . Colonoscopy  08/18/2012    Procedure: COLONOSCOPY;  Surgeon: [name], MD;  [facility]: [facility];  Service: Endoscopy;  Laterality: N/A;  1:25  . Esophagogastroduodenoscopy  08/18/2012    Procedure: ESOPHAGOGASTRODUODENOSCOPY (EGD);  Surgeon: [name], MD;  Location: [facility];  Service: Endoscopy;  Laterality: N/A;  . Givens capsule study N/A 01/25/2013    Procedure: GIVENS CAPSULE STUDY;  Surgeon: [name], MD;  Location: [facility];  Service: Endoscopy;  Laterality: N/A;  730    Family History  Problem Relation Age of Onset  . CAD Mother     Social History John Sampson reports that he quit smoking about 12 years ago. His smoking use included Cigarettes. He has a 135 pack-year smoking history. He does not have any smokeless tobacco history on file. John Sampson reports that he does not drink alcohol.  Review of Systems No palpitations, dizziness, syncope. Has COPD/bronchitis with occasional MDI use. Other systems reviewed and negative.  Physical Examination Filed Vitals:   06/07/14 0839  BP: 162/82  Pulse: 59   Filed Weights   06/07/14 0839  Weight: 227 lb (102.967 kg)   Obese male, appears comfortable at rest. HEENT: Conjunctiva and lids normal, oropharynx clear. Neck: Supple, no elevated JVP, soft left carotid bruits, no thyromegaly. Lungs: Clear to auscultation, nonlabored breathing at rest. Cardiac: Regular rate and rhythm, no S3, soft systolic murmur, no pericardial rub. Abdomen: Soft, nontender, bowel sounds present, no guarding or rebound. Extremities: Trace edema, distal pulses 1-2+. Skin: Warm and dry. Musculoskeletal: No kyphosis. Neuropsychiatric: Alert and oriented x3, affect grossly appropriate.   Problem List and Plan   Coronary atherosclerosis of native coronary artery Symptomatically stable on  medical therapy. We will request most recent office records from Dr. Rollene Fare including stress test results from last year. ECG reviewed today. No changes were made in medical regimen. He was given refills for fresh nitroglycerin. Followup arranged in 9 months.  PAD (peripheral artery disease) Moderate bilateral SFA disease without claudication.  Mixed hyperlipidemia On statin therapy, labs follow to the [facility] system. Last LDL was 118. I asked him to forward next results when obtained.  Essential hypertension, benign Blood pressure elevated today. No medication changes were made. Keep follow with primary care. Weight loss and walking regimen suggested.  COPD (chronic obstructive pulmonary disease) Keep follow with primary care. We did provide a refill for Proventil MDI.    Satira Sark, M.D., F.A.C.C.

## 2014-06-07 NOTE — Patient Instructions (Addendum)
Your physician wants you to follow-up in: 9 months with Dr Domenic Polite. You will receive a reminder letter in the mail two months in advance. If you don't receive a letter, please call our office to schedule the follow-up appointment.  Your physician recommends that you continue on your current medications as directed. Please refer to the Current Medication list given to you today. We have refilled your Nitroglycerin.  Thank you for choosing [facility]!!

## 2014-06-07 NOTE — Assessment & Plan Note (Signed)
Blood pressure elevated today. No medication changes were made. Keep follow with primary care. Weight loss and walking regimen suggested.

## 2014-06-07 NOTE — Assessment & Plan Note (Signed)
Moderate bilateral SFA disease without claudication.

## 2014-06-07 NOTE — Assessment & Plan Note (Signed)
On statin therapy, labs follow to the [facility] medical system. Last LDL was 118. I asked him to forward next results when obtained.

## 2014-09-16 ENCOUNTER — Telehealth: Payer: Self-pay | Admitting: *Deleted

## 2014-09-16 NOTE — Telephone Encounter (Signed)
Received labs in Dr. McDowell folder. 

## 2014-09-17 ENCOUNTER — Encounter: Payer: Self-pay | Admitting: Cardiology

## 2014-11-07 ENCOUNTER — Encounter (HOSPITAL_COMMUNITY): Payer: Self-pay | Admitting: Cardiovascular Disease

## 2014-11-12 ENCOUNTER — Telehealth: Payer: Self-pay | Admitting: *Deleted

## 2014-11-12 MED ORDER — ALBUTEROL SULFATE HFA 108 (90 BASE) MCG/ACT IN AERS: 2.0000 | INHALATION_SPRAY | Freq: Four times a day (QID) | RESPIRATORY_TRACT | Status: DC | PRN

## 2014-11-12 NOTE — Telephone Encounter (Signed)
Pt needs proair hfa inhaler called in for 12 months called in to [facility]. Dr Domenic Polite was last one to fill/tmj

## 2014-11-12 NOTE — Telephone Encounter (Signed)
Refilled for 3 months when pt is due for f/u

## 2014-11-15 ENCOUNTER — Other Ambulatory Visit: Payer: Self-pay

## 2014-11-15 MED ORDER — ALBUTEROL SULFATE HFA 108 (90 BASE) MCG/ACT IN AERS: 2.0000 | INHALATION_SPRAY | Freq: Four times a day (QID) | RESPIRATORY_TRACT | Status: DC | PRN

## 2015-01-24 ENCOUNTER — Encounter: Payer: Self-pay | Admitting: Cardiology

## 2015-01-24 ENCOUNTER — Ambulatory Visit (INDEPENDENT_AMBULATORY_CARE_PROVIDER_SITE_OTHER): Payer: Medicare Other | Admitting: Cardiology

## 2015-01-24 VITALS — BP 168/86 | HR 67 | Ht 70.0 in | Wt 215.0 lb

## 2015-01-24 DIAGNOSIS — I2510 Atherosclerotic heart disease of native coronary artery without angina pectoris: Secondary | ICD-10-CM

## 2015-01-24 DIAGNOSIS — E782 Mixed hyperlipidemia: Secondary | ICD-10-CM

## 2015-01-24 DIAGNOSIS — I10 Essential (primary) hypertension: Secondary | ICD-10-CM | POA: Diagnosis not present

## 2015-01-24 DIAGNOSIS — J449 Chronic obstructive pulmonary disease, unspecified: Secondary | ICD-10-CM

## 2015-01-24 NOTE — Patient Instructions (Signed)
Your physician wants you to follow-up in: 6 months with Dr Ferne Reus will receive a reminder letter in the mail two months in advance. If you don't receive a letter, please call our office to schedule the follow-up appointment.    Your physician recommends that you continue on your current medications as directed. Please refer to the Current Medication list given to you today.     You have been referred to Dr Sinda Du for your COPD      Thank you for choosing [facility] !

## 2015-01-24 NOTE — Progress Notes (Signed)
Cardiology Office Note  Date: 01/24/2015   ID: John Sampson, DOB 04-Oct-1936, MRN [id number]  PCP: Estelle June, PA-C  Primary Cardiologist: Rozann Lesches, MD   Chief Complaint  Patient presents with  . Coronary Artery Disease  . Hypertension    History of Present Illness: John Sampson is a 79 y.o. male last seen in July 2015. He comes in for a routine visit today, states that he recently underwent pulmonary testing through the [place] medical system and was told that he has advancing COPD. He has noticed that his albuterol inhalers have been less effective. At the present time, he does not have a local pulmonologist.  From a cardiac perspective, he denies any progressing angina symptoms on current medical regimen. Follow-up ECG today is reviewed below. We reviewed his medications. Blood pressure is elevated today. He does not exercise regularly. Still works part-time Scientist, research (medical).  Lipids have been well controlled on Zocor which she has tolerated.  I did review his last stress test, Lexiscan Myoview per Dr. Rollene Fare in 2013 is noted below.   Past Medical History  Diagnosis Date  . COPD (chronic obstructive pulmonary disease)   . Essential hypertension, benign   . GERD (gastroesophageal reflux disease)   . Headache(784.0)   . Coronary atherosclerosis of native coronary artery     Multivessel status post CABG  . Myocardial infarction   . PAD (peripheral artery disease)     Moderate bilateral SFA disease at angiography 01/2013  . Gastric ulcer     Small - nonbleeding  . Iron deficiency anemia     Negative Givens capsule study   . Pituitary macroadenoma     Past Surgical History  Procedure Laterality Date  . Coronary artery bypass graft  2003    5 grafts - details not clear  . Eye surgery  2012  . Tonsillectomy  Age 89  . Craniotomy  12/31/2011    Procedure: CRANIOTOMY HYPOPHYSECTOMY TRANSNASAL APPROACH;  Surgeon: [name], MD;  Location: [facility] NEUR[facility]S;   Service: Neurosurgery;  Laterality: N/A;  Transphenoidal Hypophysectomy With Fat Graft Harvest from right abdomen   . Colonoscopy  08/18/2012    Procedure: COLONOSCOPY;  Surgeon: [name], MD;  [facility];  Service: Endoscopy;  Laterality: N/A;  1:25  . Esophagogastroduodenoscopy  08/18/2012    Procedure: ESOPHAGOGASTRODUODENOSCOPY (EGD);  Surgeon: [name], MD;  [facility];  Service: Endoscopy;  Laterality: N/A;  . Givens capsule study N/A 01/25/2013    Procedure: GIVENS CAPSULE STUDY;  Surgeon: [name], MD;  [facility];  Service: Endoscopy;  Laterality: N/A;  730  . Lower extremity angiogram N/A 02/19/2013    Procedure: LOWER EXTREMITY ANGIOGRAM;  Surgeon: [name], MD;  Location: [facility] CATH LAB;  Service: Cardiovascular;  Laterality: N/A;  . Abdominal aortagram N/A 02/19/2013    Procedure: ABDOMINAL AORTAGRAM;  Surgeon: [name], MD;  Location: [facility] CATH LAB;  Service: Cardiovascular;  Laterality: N/A;    Current Outpatient Prescriptions  Medication Sig Dispense Refill  . acetaminophen (TYLENOL) 325 MG tablet Take 650 mg by mouth daily as needed for headache.    . albuterol (PROVENTIL HFA) 108 (90 BASE) MCG/ACT inhaler Inhale 2 puffs into the lungs every 6 (six) hours as needed for wheezing or shortness of breath. 1 Inhaler 3  . albuterol-ipratropium (COMBIVENT) 18-103 MCG/ACT inhaler Inhale 2 puffs into the lungs daily.     [name] aspirin EC  81 MG tablet Take 162 mg by mouth daily.    [name] diltiazem (TIAZAC) 180 MG 24 hr capsule Take 180 mg by mouth daily.    . ferrous sulfate 325 (65 FE) MG tablet Take 1 tablet (325 mg total) by mouth 2 (two) times daily with a meal.    . fish oil-omega-3 fatty acids 1000 MG capsule Take 2 g by mouth daily.    . folic acid (FOLVITE) 1 MG tablet Take 1 mg by mouth daily.     . furosemide (LASIX) 40 MG tablet Take 40 mg by mouth daily as needed for fluid.     [name] lisinopril (PRINIVIL,ZESTRIL) 40 MG  tablet Take 20 mg by mouth daily.    . metoprolol succinate (TOPROL-XL) 50 MG 24 hr tablet Take 25 mg by mouth daily. Take with or immediately following a meal.    . Multiple Vitamin (MULTIVITAMIN) tablet Take 1 tablet by mouth daily.    . nitroGLYCERIN (NITROSTAT) 0.4 MG SL tablet Place 1 tablet (0.4 mg total) under the tongue every 5 (five) minutes as needed for chest pain. 30 tablet 9  . omeprazole (PRILOSEC) 20 MG capsule Take 20 mg by mouth 2 (two) times daily before a meal.    . pantoprazole (PROTONIX) 40 MG tablet Take 40 mg by mouth 2 (two) times daily.     . potassium chloride (MICRO-K) 10 MEQ CR capsule Take 10 mEq by mouth daily.     . simvastatin (ZOCOR) 40 MG tablet Take 20 mg by mouth every evening.      No current facility-administered medications for this visit.    Allergies:  Review of patient's allergies indicates no known allergies.   Social History: The patient  reports that he quit smoking about 13 years ago. His smoking use included Cigarettes. He started smoking about 60 years ago. He has a 135 pack-year smoking history. He has never used smokeless tobacco. He reports that he does not drink alcohol or use illicit drugs.   ROS:  Please see the history of present illness. Otherwise, complete review of systems is positive for NYHA class 2-3 dyspnea with occasional wheezing. No recent cough, fevers or chills.  All other systems are reviewed and negative.    Physical Exam: VS:  BP 168/86 mmHg  Pulse 67  Ht 5\' 10"  (1.778 m)  Wt 215 lb (97.523 kg)  BMI 30.85 kg/m2  SpO2 98%, BMI Body mass index is 30.85 kg/(m^2).  Wt Readings from Last 3 Encounters:  01/24/15 215 lb (97.523 kg)  06/07/14 227 lb (102.967 kg)  04/15/14 230 lb (104.327 kg)     Obese male, appears comfortable at rest. HEENT: Conjunctiva and lids normal, oropharynx clear. Neck: Supple, no elevated JVP, soft left carotid bruits, no thyromegaly. Lungs: Clear to auscultation, nonlabored breathing at  rest. Cardiac: Regular rate and rhythm, no S3, soft systolic murmur, no pericardial rub. Abdomen: Soft, nontender, bowel sounds present, no guarding or rebound. Extremities: Trace edema, distal pulses 1-2+. Skin: Warm and dry. Musculoskeletal: No kyphosis. Neuropsychiatric: Alert and oriented x3, affect grossly appropriate.   ECG: ECG is ordered today and reviewed showing sinus rhythm with significant anterolateral ST-T wave abnormalities that are persistent impaired old tracings.  Recent Labwork:  Lab work from August 2015 revealed BUN 11, creatinine 1.0, potassium 4.7, ALT 36, AST 21, cholesterol 159, triglycerides 161, HDL 49, LDL 77.   Other Studies Reviewed Today:  1. Echocardiogram from March 2014 showed mildly dilated LV with mild LVH, LVEF 50-55%  with inferior hypokinesis, grade 1 diastolic dysfunction, mild mitral regurgitation, mild biatrial enlargement.   2. Carotid Dopplers from September 2014 demonstrated less than 50% bilateral ICA stenoses.  3. Lexiscan Myoview 12/08/2011 with Dr. Rollene Fare indicated moderate sized scar in the inferolateral wall from mid to basal segments with mild peri-infarct ischemia, LVEF 43%.  ASSESSMENT AND PLAN:  1. Multivessel CAD status post CABG in 2003, no active angina symptoms on medical therapy. Lexiscan Myoview from 2013 is noted above. We will continue observation for now.  2. Advancing COPD based on available information. Patient indicates that albuterol inhalers have been less effective and that he underwent recent pulmonary testing through the [facility] medical system. I plan to refer him to see Dr. Luan Pulling to establish local follow-up.  3. Essential hypertension, blood pressure elevated today. No changes were made in his regimen, we did discuss sodium restriction and weight loss. Keep follow-up with primary care.  4. Hyperlipidemia, on statin therapy, recent LDL 77.  Current medicines are reviewed at length with the patient today.  The  patient does not have concerns regarding medicines.   Orders Placed This Encounter  Procedures  . EKG 12-Lead    Disposition: FU with me in 6 months.   Signed, Satira Sark, MD, [facility] 01/24/2015 8:53 AM    [facility] [address] [place], [place] [address] Phone: [phone]; Fax: [phone]

## 2015-02-24 DIAGNOSIS — I2510 Atherosclerotic heart disease of native coronary artery without angina pectoris: Secondary | ICD-10-CM | POA: Diagnosis not present

## 2015-02-24 DIAGNOSIS — I10 Essential (primary) hypertension: Secondary | ICD-10-CM | POA: Diagnosis not present

## 2015-02-24 DIAGNOSIS — J449 Chronic obstructive pulmonary disease, unspecified: Secondary | ICD-10-CM | POA: Diagnosis not present

## 2015-04-07 DIAGNOSIS — J449 Chronic obstructive pulmonary disease, unspecified: Secondary | ICD-10-CM | POA: Diagnosis not present

## 2015-04-07 DIAGNOSIS — I10 Essential (primary) hypertension: Secondary | ICD-10-CM | POA: Diagnosis not present

## 2015-04-07 DIAGNOSIS — I2510 Atherosclerotic heart disease of native coronary artery without angina pectoris: Secondary | ICD-10-CM | POA: Diagnosis not present

## 2015-05-29 ENCOUNTER — Other Ambulatory Visit: Payer: Self-pay

## 2015-05-29 DIAGNOSIS — M79604 Pain in right leg: Secondary | ICD-10-CM

## 2015-05-29 DIAGNOSIS — M79605 Pain in left leg: Principal | ICD-10-CM

## 2015-05-30 ENCOUNTER — Ambulatory Visit (INDEPENDENT_AMBULATORY_CARE_PROVIDER_SITE_OTHER): Payer: Medicare Other | Admitting: Adult Health

## 2015-05-30 ENCOUNTER — Encounter: Payer: Self-pay | Admitting: Adult Health

## 2015-05-30 VITALS — BP 148/90 | HR 145 | Ht 69.0 in | Wt 233.0 lb

## 2015-05-30 DIAGNOSIS — I483 Typical atrial flutter: Secondary | ICD-10-CM

## 2015-05-30 DIAGNOSIS — R000 Tachycardia, unspecified: Secondary | ICD-10-CM | POA: Diagnosis not present

## 2015-05-30 DIAGNOSIS — I2510 Atherosclerotic heart disease of native coronary artery without angina pectoris: Secondary | ICD-10-CM

## 2015-05-30 DIAGNOSIS — I10 Essential (primary) hypertension: Secondary | ICD-10-CM

## 2015-05-30 MED ORDER — METOPROLOL SUCCINATE ER 50 MG PO TB24
50.0000 mg | ORAL_TABLET | Freq: Every day | ORAL | Status: DC
Start: 2015-05-30 — End: 2016-05-04

## 2015-05-30 MED ORDER — DILTIAZEM HCL ER COATED BEADS 240 MG PO CP24: 240.0000 mg | ORAL_CAPSULE | Freq: Every day | ORAL | Status: DC

## 2015-05-30 MED ORDER — LISINOPRIL 10 MG PO TABS: 10.0000 mg | ORAL_TABLET | Freq: Every day | ORAL | Status: DC

## 2015-05-30 MED ORDER — APIXABAN 5 MG PO TABS: 5.0000 mg | ORAL_TABLET | Freq: Two times a day (BID) | ORAL | Status: DC

## 2015-05-30 NOTE — Patient Instructions (Signed)
Your physician recommends that you schedule a follow-up appointment in: 1 week   Your physician has recommended you make the following change in your medication:   Start: Eliquis 5 mg Two times Daily  Increase Diltiazem to 240 mg Daily  Decrease  Lisinopril to 10 mg Daily   Increase Toprol XL to 50 mg Daily   Your physician recommends that you return for lab work on Tuesday   Thank you for choosing [facility]!

## 2015-05-30 NOTE — Progress Notes (Deleted)
Name: John Sampson    DOB: 01/29/1936  Age: 79 y.o.  MR#: [id number]       PCP:  Estelle June, PA-C      Insurance: Payor: MEDICARE / Plan: MEDICARE PART A AND B / Product Type: *No Product type* /   CC:    Chief Complaint  Patient presents with  . Coronary Artery Disease  . Hypertension    VS Filed Vitals:   05/30/15 1545  BP: 148/90  Pulse: 145  Height: 5\' 9"  (1.753 m)  Weight: 233 lb (105.688 kg)  SpO2: 95%    Weights Current Weight  05/30/15 233 lb (105.688 kg)  01/24/15 215 lb (97.523 kg)  06/07/14 227 lb (102.967 kg)    Blood Pressure  BP Readings from Last 3 Encounters:  05/30/15 148/90  01/24/15 168/86  06/07/14 162/82     Admit date:  (Not on file) Last encounter with RMR:  Visit date not found   Allergy Review of patient's allergies indicates no known allergies.  Current Outpatient Prescriptions  Medication Sig Dispense Refill  . acetaminophen (TYLENOL) 325 MG tablet Take 650 mg by mouth daily as needed for headache.    . albuterol (PROVENTIL HFA) 108 (90 BASE) MCG/ACT inhaler Inhale 2 puffs into the lungs every 6 (six) hours as needed for wheezing or shortness of breath. 1 Inhaler 3  . albuterol-ipratropium (COMBIVENT) 18-103 MCG/ACT inhaler Inhale 2 puffs into the lungs daily.     [name] aspirin EC 81 MG tablet Take 162 mg by mouth daily.    [name] diltiazem (TIAZAC) 180 MG 24 hr capsule Take 180 mg by mouth daily.    . ferrous sulfate 325 (65 FE) MG tablet Take 1 tablet (325 mg total) by mouth 2 (two) times daily with a meal.    . fish oil-omega-3 fatty acids 1000 MG capsule Take 2 g by mouth daily.    . Fluticasone Furoate-Vilanterol (BREO ELLIPTA IN) Inhale into the lungs.    . folic acid (FOLVITE) 1 MG tablet Take 1 mg by mouth daily.     . furosemide (LASIX) 40 MG tablet Take 40 mg by mouth daily as needed for fluid.     [name] lisinopril (PRINIVIL,ZESTRIL) 40 MG tablet Take 20 mg by mouth daily.    . metoprolol succinate (TOPROL-XL) 50 MG 24 hr tablet Take 25 mg  by mouth daily. Take with or immediately following a meal.    . Multiple Vitamin (MULTIVITAMIN) tablet Take 1 tablet by mouth daily.    . nitroGLYCERIN (NITROSTAT) 0.4 MG SL tablet Place 1 tablet (0.4 mg total) under the tongue every 5 (five) minutes as needed for chest pain. 30 tablet 9  . omeprazole (PRILOSEC) 20 MG capsule Take 20 mg by mouth 2 (two) times daily before a meal.    . pantoprazole (PROTONIX) 40 MG tablet Take 40 mg by mouth 2 (two) times daily.     . potassium chloride (MICRO-K) 10 MEQ CR capsule Take 10 mEq by mouth daily.     . simvastatin (ZOCOR) 40 MG tablet Take 20 mg by mouth every evening.      No current facility-administered medications for this visit.    Discontinued Meds:   There are no discontinued medications.  Patient Active Problem List   Diagnosis Date Noted  . PAD (peripheral artery disease) 06/07/2014  . COPD (chronic obstructive pulmonary disease) 01/23/2013  . Essential hypertension, benign 08/03/2012  . Mixed hyperlipidemia 08/03/2012  . Coronary atherosclerosis of native coronary artery  08/03/2012    LABS    Component Value Date/Time   NA 141 08/14/2013 0818   NA 136 01/03/2012 0450   NA 137 01/02/2012 0453   K 4.1 08/14/2013 0818   K 3.7 01/01/2012 0119   K 4.2 12/31/2011 1300   CL 103 08/14/2013 0818   CL 103 01/01/2012 0119   CL 107 12/31/2011 1300   CO2 30 08/14/2013 0818   CO2 27 01/01/2012 0119   CO2 22 12/31/2011 1300   GLUCOSE 99 08/14/2013 0818   GLUCOSE 143* 01/01/2012 0119   GLUCOSE 162* 12/31/2011 1300   BUN 14 08/14/2013 0818   BUN 11 01/01/2012 0119   BUN 12 12/31/2011 1300   CREATININE 1.00 08/14/2013 0818   CREATININE 0.72 01/01/2012 0119   CREATININE 0.71 12/31/2011 1300   CREATININE 0.84 12/24/2011 1450   CALCIUM 10.0 08/14/2013 0818   CALCIUM 8.9 01/01/2012 0119   CALCIUM 8.5 12/31/2011 1300   GFRNONAA 90* 01/01/2012 0119   GFRNONAA >90 12/31/2011 1300   GFRNONAA 84* 12/24/2011 1450   GFRAA >90 01/01/2012  0119   GFRAA >90 12/31/2011 1300   GFRAA >90 12/24/2011 1450   CMP     Component Value Date/Time   NA 141 08/14/2013 0818   K 4.1 08/14/2013 0818   CL 103 08/14/2013 0818   CO2 30 08/14/2013 0818   GLUCOSE 99 08/14/2013 0818   BUN 14 08/14/2013 0818   CREATININE 1.00 08/14/2013 0818   CREATININE 0.72 01/01/2012 0119   CALCIUM 10.0 08/14/2013 0818   PROT 6.9 08/14/2013 0818   ALBUMIN 4.4 08/14/2013 0818   AST 16 08/14/2013 0818   ALT 16 08/14/2013 0818   ALKPHOS 80 08/14/2013 0818   BILITOT 0.6 08/14/2013 0818   GFRNONAA 90* 01/01/2012 0119   GFRAA >90 01/01/2012 0119       Component Value Date/Time   WBC 7.9 08/14/2013 0818   WBC 12.3* 12/31/2011 1300   WBC 13.1* 12/24/2011 1450   HGB 14.3 08/14/2013 0818   HGB 9.5* 02/01/2013 1142   HGB 10.4* 08/18/2012 1354   HCT 43.1 08/14/2013 0818   HCT 31.6* 02/01/2013 1142   HCT 34.9* 08/18/2012 1354   MCV 89.8 08/14/2013 0818   MCV 86.9 12/31/2011 1300   MCV 87.7 12/24/2011 1450    Lipid Panel     Component Value Date/Time   CHOL 184 08/14/2013 0818   TRIG 108 08/14/2013 0818   HDL 44 08/14/2013 0818   CHOLHDL 4.2 08/14/2013 0818   VLDL 22 08/14/2013 0818   LDLCALC 118* 08/14/2013 0818    ABG No results found for: PHART, PCO2ART, PO2ART, HCO3, TCO2, ACIDBASEDEF, O2SAT   Lab Results  Component Value Date   TSH 2.397 08/14/2013   BNP (last 3 results) No results for input(s): BNP in the last 8760 hours.  ProBNP (last 3 results) No results for input(s): PROBNP in the last 8760 hours.  Cardiac Panel (last 3 results) No results for input(s): CKTOTAL, CKMB, TROPONINI, RELINDX in the last 72 hours.  Iron/TIBC/Ferritin/ %Sat No results found for: IRON, TIBC, FERRITIN, IRONPCTSAT   EKG Orders placed or performed in visit on 01/24/15  . EKG 12-Lead     Prior Assessment and Plan Problem List as of 05/30/2015      Cardiovascular and Mediastinum   Essential hypertension, benign   Last Assessment & Plan 06/07/2014  Office Visit Written 06/07/2014  9:12 AM by Satira Sark, MD    Blood pressure elevated today. No medication changes were  made. Keep follow with primary care. Weight loss and walking regimen suggested.      Coronary atherosclerosis of native coronary artery   Last Assessment & Plan 06/07/2014 Office Visit Written 06/07/2014  9:10 AM by Satira Sark, MD    Symptomatically stable on medical therapy. We will request most recent office records from Dr. Rollene Fare including stress test results from last year. ECG reviewed today. No changes were made in medical regimen. He was given refills for fresh nitroglycerin. Followup arranged in 9 months.      PAD (peripheral artery disease)   Last Assessment & Plan 06/07/2014 Office Visit Written 06/07/2014  9:11 AM by Satira Sark, MD    Moderate bilateral SFA disease without claudication.        Respiratory   COPD (chronic obstructive pulmonary disease)   Last Assessment & Plan 06/07/2014 Office Visit Written 06/07/2014  9:12 AM by Satira Sark, MD    Keep follow with primary care. We did provide a refill for Proventil MDI.        Other   Mixed hyperlipidemia   Last Assessment & Plan 06/07/2014 Office Visit Written 06/07/2014  9:11 AM by Satira Sark, MD    On statin therapy, labs follow to the [facility] medical system. Last LDL was 118. I asked him to forward next results when obtained.          Imaging: No results found.

## 2015-05-30 NOTE — Progress Notes (Signed)
Cardiology Office Note   Date:  05/30/2015   ID:  John Sampson, DOB 06-Mar-1936, MRN [id number]  PCP:  Estelle June, PA-C  Cardiologist:  [place]/ Jory Sims, NP   Chief Complaint  Patient presents with  . Coronary Artery Disease  . Hypertension      History of Present Illness: John Sampson is a 79 y.o. male who presents for and going assessment and management of coronary artery disease, and hypertension.the patient also has a history of advancing COPD.  He was last seen by Dr. Domenic Polite in February 2016 was stable from a cardiac standpoint.  The patient was continued on medical therapy.  His last LexiScan Myoview was in January 2013 revealing inferior wall from mid to basal segments with peri-infarct ischemia.  Blood pressure is mildly elevated on his last office visit, but no changes were made in his regimen..  Sodium restriction, and weight loss were recommended. [name]  He comes today with abnormal EKG which was competed as a result of elevated HR on triage vital signs. EKG completed demonstrated atrial flutter with HR of 145 bpm. . He is completely asymptomatic. Has no perception of elevated HR. He admits to drinking 7-8 cups of coffee a day and 4-5 glasses of iced tea. He has no idea how long he has been in atrial flutter He denies chest pain, dyspnea or dizziness. He is taking his medications as directed with the exception of lasix which was discontinued by [facility] hospiotal It was changed to HCTZ.   Past Medical History  Diagnosis Date  . COPD (chronic obstructive pulmonary disease)   . Essential hypertension, benign   . GERD (gastroesophageal reflux disease)   . Headache(784.0)   . Coronary atherosclerosis of native coronary artery     Multivessel status post CABG  . Myocardial infarction   . PAD (peripheral artery disease)     Moderate bilateral SFA disease at angiography 01/2013  . Gastric ulcer     Small - nonbleeding  . Iron deficiency anemia     Negative Givens capsule study    . Pituitary macroadenoma     Past Surgical History  Procedure Laterality Date  . Coronary artery bypass graft  2003    5 grafts - details not clear  . Eye surgery  2012  . Tonsillectomy  Age 6  . Craniotomy  12/31/2011    Procedure: CRANIOTOMY HYPOPHYSECTOMY TRANSNASAL APPROACH;  Surgeon: [name], MD;  Location: [facility];  Service: Neurosurgery;  Laterality: N/A;  Transphenoidal Hypophysectomy With Fat Graft Harvest from right abdomen   . Colonoscopy  08/18/2012    Procedure: COLONOSCOPY;  Surgeon: [name], MD;  [facility];  Service: Endoscopy;  Laterality: N/A;  1:25  . Esophagogastroduodenoscopy  08/18/2012    Procedure: ESOPHAGOGASTRODUODENOSCOPY (EGD);  Surgeon: [name], MD;  [facility]: [facility];  Service: Endoscopy;  Laterality: N/A;  . Givens capsule study N/A 01/25/2013    Procedure: GIVENS CAPSULE STUDY;  Surgeon: [name], MD;  [facility]: [facility];  Service: Endoscopy;  Laterality: N/A;  730  . Lower extremity angiogram N/A 02/19/2013    Procedure: LOWER EXTREMITY ANGIOGRAM;  Surgeon: [name], MD;  Location: [facility] CATH LAB;  Service: Cardiovascular;  Laterality: N/A;  . Abdominal aortagram N/A 02/19/2013    Procedure: ABDOMINAL AORTAGRAM;  Surgeon: [name], MD;  Location: [facility] CATH LAB;  Service: Cardiovascular;  Laterality: N/A;     Current Outpatient Prescriptions  Medication  Sig Dispense Refill  . acetaminophen (TYLENOL) 325 MG tablet Take 650 mg by mouth daily as needed for headache.    . albuterol (PROVENTIL HFA) 108 (90 BASE) MCG/ACT inhaler Inhale 2 puffs into the lungs every 6 (six) hours as needed for wheezing or shortness of breath. 1 Inhaler 3  . albuterol-ipratropium (COMBIVENT) 18-103 MCG/ACT inhaler Inhale 2 puffs into the lungs daily.     [name] aspirin EC 81 MG tablet Take 162 mg by mouth daily.    [name] diltiazem (TIAZAC) 180 MG 24 hr capsule Take 180 mg by mouth daily.    . ferrous sulfate 325 (65 FE) MG  tablet Take 1 tablet (325 mg total) by mouth 2 (two) times daily with a meal.    . fish oil-omega-3 fatty acids 1000 MG capsule Take 2 g by mouth daily.    . folic acid (FOLVITE) 1 MG tablet Take 1 mg by mouth daily.     . furosemide (LASIX) 40 MG tablet Take 40 mg by mouth daily as needed for fluid.     [name] lisinopril (PRINIVIL,ZESTRIL) 40 MG tablet Take 20 mg by mouth daily.    . metoprolol succinate (TOPROL-XL) 50 MG 24 hr tablet Take 25 mg by mouth daily. Take with or immediately following a meal.    . Multiple Vitamin (MULTIVITAMIN) tablet Take 1 tablet by mouth daily.    . nitroGLYCERIN (NITROSTAT) 0.4 MG SL tablet Place 1 tablet (0.4 mg total) under the tongue every 5 (five) minutes as needed for chest pain. 30 tablet 9  . omeprazole (PRILOSEC) 20 MG capsule Take 20 mg by mouth 2 (two) times daily before a meal.    . pantoprazole (PROTONIX) 40 MG tablet Take 40 mg by mouth 2 (two) times daily.     . potassium chloride (MICRO-K) 10 MEQ CR capsule Take 10 mEq by mouth daily.     . simvastatin (ZOCOR) 40 MG tablet Take 20 mg by mouth every evening.      No current facility-administered medications for this visit.    Allergies:   Review of patient's allergies indicates no known allergies.    Social History:  The patient  reports that he quit smoking about 13 years ago. His smoking use included Cigarettes. He started smoking about 60 years ago. He has a 135 pack-year smoking history. He has never used smokeless tobacco. He reports that he does not drink alcohol or use illicit drugs.   Family History:  The patient's family history includes CAD in his mother.    ROS: .   All other systems are reviewed and negative.Unless otherwise mentioned in  H&P above.   PHYSICAL EXAM: VS:  Ht 5\' 9"  (1.753 m)  Wt 233 lb (105.688 kg)  BMI 34.39 kg/m2 , BMI Body mass index is 34.39 kg/(m^2). GEN: Well nourished, well developed, in no acute distress HEENT: normal Neck: no JVD, carotid bruits, or  masses Cardiac: RRR, tachycardic; no murmurs, rubs, or gallops 1+pre-tibial pitting edema. Respiratory:  Clear to auscultation bilaterally, normal work of breathing GI: soft, nontender, nondistended, + BS MS: no deformity or atrophy Skin: warm and dry, no rash Neuro:  Strength and sensation are intact Psych: euthymic mood, full affect   EKG:  The ekg ordered today demonstrates atrial flutter, rate of 144 bpm.    Recent Labs: No results found for requested labs within last 365 days.     Lipid Panel    Component Value Date/Time   CHOL 184 08/14/2013 0818  TRIG 108 08/14/2013 0818   HDL 44 08/14/2013 0818   CHOLHDL 4.2 08/14/2013 0818   VLDL 22 08/14/2013 0818   LDLCALC 118* 08/14/2013 0818      Wt Readings from Last 3 Encounters:  05/30/15 233 lb (105.688 kg)  01/24/15 215 lb (97.523 kg)  06/07/14 227 lb (102.967 kg)      Other studies Reviewed: Additional studies/ records that were reviewed today include: None  Review of the above records demonstrates: NA   ASSESSMENT AND PLAN:  1.  New Onset Atrial flutter with RVR: The patient is adamantly refusing to be admitted for IV treatment of HR control and monitoring of response to medications. I have reviewed the case with Dr. Domenic Polite on site who has also spoken with the patient concerning his abnormal EKG.     We will increase diltiazem to 240 mg daily, increase metoprolol to 50 mg daily, decrease lisinopril to 10 mg daily. Will add Eliquis 5 mg BID. He will be seen next week and discuss need for TEE DCCV at that time.   2. Excessive Caffeine intake: I have advised him to significantly reduce his caffeine intake.   3. Hypertension: BP has been a little higher than usual. With medication changes will see him sooner to evaluate BP.     Current medicines are reviewed at length with the patient today.    Labs/ tests ordered today include: BMET.    Disposition:   FU with less than a week.   Signed, Jory Sims,  NP  05/30/2015 3:46 PM    [facility] [address] [place], [place] [address] Phone: [phone]; Fax: [phone]

## 2015-06-03 DIAGNOSIS — I10 Essential (primary) hypertension: Secondary | ICD-10-CM | POA: Diagnosis not present

## 2015-06-04 LAB — BASIC METABOLIC PANEL
BUN: 17 mg/dL (ref 6–23)
CHLORIDE: 102 meq/L (ref 96–112)
CO2: 25 meq/L (ref 19–32)
Calcium: 9.4 mg/dL (ref 8.4–10.5)
Creat: 0.88 mg/dL (ref 0.50–1.35)
GLUCOSE: 93 mg/dL (ref 70–99)
Potassium: 4.4 mEq/L (ref 3.5–5.3)
SODIUM: 137 meq/L (ref 135–145)

## 2015-06-04 LAB — MAGNESIUM: Magnesium: 2 mg/dL (ref 1.5–2.5)

## 2015-06-10 ENCOUNTER — Other Ambulatory Visit: Payer: Self-pay | Admitting: Adult Health

## 2015-06-10 ENCOUNTER — Ambulatory Visit (INDEPENDENT_AMBULATORY_CARE_PROVIDER_SITE_OTHER): Payer: Medicare Other | Admitting: Adult Health

## 2015-06-10 ENCOUNTER — Encounter: Payer: Self-pay | Admitting: Adult Health

## 2015-06-10 VITALS — BP 128/72 | HR 77 | Ht 69.0 in | Wt 235.8 lb

## 2015-06-10 DIAGNOSIS — Z01818 Encounter for other preprocedural examination: Secondary | ICD-10-CM | POA: Diagnosis not present

## 2015-06-10 DIAGNOSIS — R000 Tachycardia, unspecified: Secondary | ICD-10-CM | POA: Diagnosis not present

## 2015-06-10 DIAGNOSIS — I2510 Atherosclerotic heart disease of native coronary artery without angina pectoris: Secondary | ICD-10-CM

## 2015-06-10 DIAGNOSIS — I4891 Unspecified atrial fibrillation: Secondary | ICD-10-CM | POA: Diagnosis not present

## 2015-06-10 MED ORDER — FUROSEMIDE 40 MG PO TABS: 40.0000 mg | ORAL_TABLET | Freq: Every day | ORAL | Status: DC | PRN

## 2015-06-10 NOTE — Progress Notes (Signed)
Cardiology Office Note   Date:  06/10/2015   ID:  John Sampson, DOB 1936-04-19, MRN [id number]  PCP:  John June, PA-C  Cardiologist:  [place]/ Jory Sims, NP   Chief Complaint  Patient presents with  . Coronary Artery Disease  . Hypertension      History of Present Illness: John Sampson is a 79 y.o. male who presents for ongoing assessment and management of CAD, hypertension with hx of advancing COPD. He was newly diagnosed with atrial flutter with RVR in the last office visit, HR 145 bpm. He was asymptomatic. He admitted to drinking a very large amount of caffeine. He refused hospitalization for IV treatment and rate control. He was increased on diltiazem dose to 240 mg and metoprolol was increased to 50 mg daily, with decrease in lisinopril to 10 mg daily. Added Eliquis 5 mg BID. He is here for follow up and to discuss need for TEE DCCV if necessary.   He comes today feeling much better. He cannot tell if he is in rhythm or still in atrial fib. He could not tell when he was rapid on last visit. He is tolerating the Eliquis, higher dose of diltiazem and metoprolol, and lower dose of lisinopril. He works part time as Freight forwarder at [employer] and states he is able to work his shift without fatigue or dyspnea. He has cut down on caffeine significantly.  Past Medical History  Diagnosis Date  . COPD (chronic obstructive pulmonary disease)   . Essential hypertension, benign   . GERD (gastroesophageal reflux disease)   . Headache(784.0)   . Coronary atherosclerosis of native coronary artery     Multivessel status post CABG  . Myocardial infarction   . PAD (peripheral artery disease)     Moderate bilateral SFA disease at angiography 01/2013  . Gastric ulcer     Small - nonbleeding  . Iron deficiency anemia     Negative Givens capsule study   . Pituitary macroadenoma     Past Surgical History  Procedure Laterality Date  . Coronary artery bypass graft  2003    5 grafts -  details not clear  . Eye surgery  2012  . Tonsillectomy  Age 30  . Craniotomy  12/31/2011    Procedure: CRANIOTOMY HYPOPHYSECTOMY TRANSNASAL APPROACH;  Surgeon: [name], MD;  Location: [facility];  Service: Neurosurgery;  Laterality: N/A;  Transphenoidal Hypophysectomy With Fat Graft Harvest from right abdomen   . Colonoscopy  08/18/2012    Procedure: COLONOSCOPY;  Surgeon: [name], MD;  [facility];  Service: Endoscopy;  Laterality: N/A;  1:25  . Esophagogastroduodenoscopy  08/18/2012    Procedure: ESOPHAGOGASTRODUODENOSCOPY (EGD);  Surgeon: [name], MD;  [facility];  Service: Endoscopy;  Laterality: N/A;  . Givens capsule study N/A 01/25/2013    Procedure: GIVENS CAPSULE STUDY;  Surgeon: [name], MD;  [facility]: [facility];  Service: Endoscopy;  Laterality: N/A;  730  . Lower extremity angiogram N/A 02/19/2013    Procedure: LOWER EXTREMITY ANGIOGRAM;  Surgeon: [name], MD;  Location: [facility] CATH LAB;  Service: Cardiovascular;  Laterality: N/A;  . Abdominal aortagram N/A 02/19/2013    Procedure: ABDOMINAL AORTAGRAM;  Surgeon: [name], MD;  Location: [facility] CATH LAB;  Service: Cardiovascular;  Laterality: N/A;     Current Outpatient Prescriptions  Medication Sig Dispense Refill  . acetaminophen (TYLENOL) 325 MG tablet Take 650 mg by mouth daily as needed  for headache.    . albuterol (PROVENTIL HFA) 108 (90 BASE) MCG/ACT inhaler Inhale 2 puffs into the lungs every 6 (six) hours as needed for wheezing or shortness of breath. 1 Inhaler 3  . albuterol-ipratropium (COMBIVENT) 18-103 MCG/ACT inhaler Inhale 2 puffs into the lungs daily.     [name] apixaban (ELIQUIS) 5 MG TABS tablet Take 1 tablet (5 mg total) by mouth 2 (two) times daily. 60 tablet 11  . aspirin EC 81 MG tablet Take 162 mg by mouth daily.    [name] diltiazem (CARDIZEM CD) 240 MG 24 hr capsule Take 1 capsule (240 mg total) by mouth daily. 90 capsule 3  . ferrous sulfate 325 (65 FE)  MG tablet Take 1 tablet (325 mg total) by mouth 2 (two) times daily with a meal.    . fish oil-omega-3 fatty acids 1000 MG capsule Take 2 g by mouth daily.    . Fluticasone Furoate-Vilanterol (BREO ELLIPTA IN) Inhale into the lungs.    . folic acid (FOLVITE) 1 MG tablet Take 1 mg by mouth daily.     . furosemide (LASIX) 40 MG tablet Take 40 mg by mouth daily as needed for fluid.     [name] lisinopril (PRINIVIL,ZESTRIL) 10 MG tablet Take 1 tablet (10 mg total) by mouth daily. 90 tablet 3  . metoprolol succinate (TOPROL-XL) 50 MG 24 hr tablet Take 1 tablet (50 mg total) by mouth daily. 30 tablet 11  . Multiple Vitamin (MULTIVITAMIN) tablet Take 1 tablet by mouth daily.    . nitroGLYCERIN (NITROSTAT) 0.4 MG SL tablet Place 1 tablet (0.4 mg total) under the tongue every 5 (five) minutes as needed for chest pain. 30 tablet 9  . omeprazole (PRILOSEC) 20 MG capsule Take 20 mg by mouth 2 (two) times daily before a meal.    . pantoprazole (PROTONIX) 40 MG tablet Take 40 mg by mouth 2 (two) times daily.     . potassium chloride (MICRO-K) 10 MEQ CR capsule Take 10 mEq by mouth daily.     . simvastatin (ZOCOR) 40 MG tablet Take 20 mg by mouth every evening.      No current facility-administered medications for this visit.    Allergies:   Review of patient's allergies indicates no known allergies.    Social History:  The patient  reports that he quit smoking about 13 years ago. His smoking use included Cigarettes. He started smoking about 60 years ago. He has a 135 pack-year smoking history. He has never used smokeless tobacco. He reports that he does not drink alcohol or use illicit drugs.   Family History:  The patient's family history includes CAD in his mother.    ROS: .   All other systems are reviewed and negative.Unless otherwise mentioned in H&P above.   PHYSICAL EXAM: VS:  BP 128/72 mmHg  Pulse 77  Ht 5\' 9"  (1.753 m)  Wt 235 lb 12.8 oz (106.958 kg)  BMI 34.81 kg/m2  SpO2 91% , BMI Body mass  index is 34.81 kg/(m^2). GEN: Well nourished, well developed, in no acute distress HEENT: normal Neck: no JVD, carotid bruits, or masses Cardiac: IRRR; no murmurs, rubs, or gallops,no edema  Respiratory:  Clear to auscultation bilaterally, normal work of breathing GI: soft, nontender, nondistended, + BS MS: no deformity or atrophy Skin: warm and dry, no rash Neuro:  Strength and sensation are intact Psych: euthymic mood, full affect   EKG:   The ekg ordered today demonstrates  Atrial fib rate of 87  bpm.   Recent Labs: 06/03/2015: BUN 17; Creat 0.88; Magnesium 2.0; Potassium 4.4; Sodium 137    Lipid Panel    Component Value Date/Time   CHOL 184 08/14/2013 0818   TRIG 108 08/14/2013 0818   HDL 44 08/14/2013 0818   CHOLHDL 4.2 08/14/2013 0818   VLDL 22 08/14/2013 0818   LDLCALC 118* 08/14/2013 0818      Wt Readings from Last 3 Encounters:  06/10/15 235 lb 12.8 oz (106.958 kg)  05/30/15 233 lb (105.688 kg)  01/24/15 215 lb (97.523 kg)       ASSESSMENT AND PLAN:  1.  New Onset Atrial fib: Heart rate is well controlled now. He is tolerating medications adjustments and Outlook. He remains in atrial fib. I will provide more samples of Eliquis as this is too expensive for him, and give him a Rx for 30 days for free.  I have explained this to the patient and he is willing to proceed.   2.  Hypertension: BP is tolerating meds without complaints or dizziness. He is compliant with his medications. He has some significant LEE. I have asked him to take his lasix prn due to this. He will be given Rx for lasix 40 mg prn as he was on this before.   3. CAD: Hx of CABG: No evidence of new ischemia on EKG. Can consider repeat MPI once he is back in rhythm. Also consider sleep study.   Current medicines are reviewed at length with the patient today.    Labs/ tests ordered today include: BMET, CBC., Schedule TEE.DCCV as discussed with Dr. Domenic Polite when patient initially presented on  05/30/2015.  No orders of the defined types were placed in this encounter.     Disposition:   FU with post procedure  Signed, Jory Sims, NP  06/10/2015 2:14 PM    [facility] [address] [place], [place] [address] Phone: [phone]; Fax: [phone]

## 2015-06-10 NOTE — Progress Notes (Deleted)
Name: John Sampson    DOB: 1936/02/17  Age: 79 y.o.  MR#: [id number]       PCP:  Estelle June, PA-C      Insurance: Payor: MEDICARE / Plan: MEDICARE PART A AND B / Product Type: *No Product type* /   CC:    Chief Complaint  Patient presents with  . Coronary Artery Disease  . Hypertension    VS Filed Vitals:   06/10/15 1403  BP: 128/72  Pulse: 77  Height: 5\' 9"  (1.753 m)  Weight: 235 lb 12.8 oz (106.958 kg)  SpO2: 91%    Weights Current Weight  06/10/15 235 lb 12.8 oz (106.958 kg)  05/30/15 233 lb (105.688 kg)  01/24/15 215 lb (97.523 kg)    Blood Pressure  BP Readings from Last 3 Encounters:  06/10/15 128/72  05/30/15 148/90  01/24/15 168/86     Admit date:  (Not on file) Last encounter with RMR:  05/30/2015   Allergy Review of patient's allergies indicates no known allergies.  Current Outpatient Prescriptions  Medication Sig Dispense Refill  . acetaminophen (TYLENOL) 325 MG tablet Take 650 mg by mouth daily as needed for headache.    . albuterol (PROVENTIL HFA) 108 (90 BASE) MCG/ACT inhaler Inhale 2 puffs into the lungs every 6 (six) hours as needed for wheezing or shortness of breath. 1 Inhaler 3  . albuterol-ipratropium (COMBIVENT) 18-103 MCG/ACT inhaler Inhale 2 puffs into the lungs daily.     [name] apixaban (ELIQUIS) 5 MG TABS tablet Take 1 tablet (5 mg total) by mouth 2 (two) times daily. 60 tablet 11  . aspirin EC 81 MG tablet Take 162 mg by mouth daily.    [name] diltiazem (CARDIZEM CD) 240 MG 24 hr capsule Take 1 capsule (240 mg total) by mouth daily. 90 capsule 3  . ferrous sulfate 325 (65 FE) MG tablet Take 1 tablet (325 mg total) by mouth 2 (two) times daily with a meal.    . fish oil-omega-3 fatty acids 1000 MG capsule Take 2 g by mouth daily.    . Fluticasone Furoate-Vilanterol (BREO ELLIPTA IN) Inhale into the lungs.    . folic acid (FOLVITE) 1 MG tablet Take 1 mg by mouth daily.     . furosemide (LASIX) 40 MG tablet Take 40 mg by mouth daily as needed for fluid.      [name] lisinopril (PRINIVIL,ZESTRIL) 10 MG tablet Take 1 tablet (10 mg total) by mouth daily. 90 tablet 3  . metoprolol succinate (TOPROL-XL) 50 MG 24 hr tablet Take 1 tablet (50 mg total) by mouth daily. 30 tablet 11  . Multiple Vitamin (MULTIVITAMIN) tablet Take 1 tablet by mouth daily.    . nitroGLYCERIN (NITROSTAT) 0.4 MG SL tablet Place 1 tablet (0.4 mg total) under the tongue every 5 (five) minutes as needed for chest pain. 30 tablet 9  . omeprazole (PRILOSEC) 20 MG capsule Take 20 mg by mouth 2 (two) times daily before a meal.    . pantoprazole (PROTONIX) 40 MG tablet Take 40 mg by mouth 2 (two) times daily.     . potassium chloride (MICRO-K) 10 MEQ CR capsule Take 10 mEq by mouth daily.     . simvastatin (ZOCOR) 40 MG tablet Take 20 mg by mouth every evening.      No current facility-administered medications for this visit.    Discontinued Meds:   There are no discontinued medications.  Patient Active Problem List   Diagnosis Date Noted  . PAD (peripheral  artery disease) 06/07/2014  . COPD (chronic obstructive pulmonary disease) 01/23/2013  . Essential hypertension, benign 08/03/2012  . Mixed hyperlipidemia 08/03/2012  . Coronary atherosclerosis of native coronary artery 08/03/2012    LABS    Component Value Date/Time   NA 137 06/03/2015 0801   NA 141 08/14/2013 0818   NA 136 01/03/2012 0450   K 4.4 06/03/2015 0801   K 4.1 08/14/2013 0818   K 3.7 01/01/2012 0119   CL 102 06/03/2015 0801   CL 103 08/14/2013 0818   CL 103 01/01/2012 0119   CO2 25 06/03/2015 0801   CO2 30 08/14/2013 0818   CO2 27 01/01/2012 0119   GLUCOSE 93 06/03/2015 0801   GLUCOSE 99 08/14/2013 0818   GLUCOSE 143* 01/01/2012 0119   BUN 17 06/03/2015 0801   BUN 14 08/14/2013 0818   BUN 11 01/01/2012 0119   CREATININE 0.88 06/03/2015 0801   CREATININE 1.00 08/14/2013 0818   CREATININE 0.72 01/01/2012 0119   CREATININE 0.71 12/31/2011 1300   CREATININE 0.84 12/24/2011 1450   CALCIUM 9.4  06/03/2015 0801   CALCIUM 10.0 08/14/2013 0818   CALCIUM 8.9 01/01/2012 0119   GFRNONAA 90* 01/01/2012 0119   GFRNONAA >90 12/31/2011 1300   GFRNONAA 84* 12/24/2011 1450   GFRAA >90 01/01/2012 0119   GFRAA >90 12/31/2011 1300   GFRAA >90 12/24/2011 1450   CMP     Component Value Date/Time   NA 137 06/03/2015 0801   K 4.4 06/03/2015 0801   CL 102 06/03/2015 0801   CO2 25 06/03/2015 0801   GLUCOSE 93 06/03/2015 0801   BUN 17 06/03/2015 0801   CREATININE 0.88 06/03/2015 0801   CREATININE 0.72 01/01/2012 0119   CALCIUM 9.4 06/03/2015 0801   PROT 6.9 08/14/2013 0818   ALBUMIN 4.4 08/14/2013 0818   AST 16 08/14/2013 0818   ALT 16 08/14/2013 0818   ALKPHOS 80 08/14/2013 0818   BILITOT 0.6 08/14/2013 0818   GFRNONAA 90* 01/01/2012 0119   GFRAA >90 01/01/2012 0119       Component Value Date/Time   WBC 7.9 08/14/2013 0818   WBC 12.3* 12/31/2011 1300   WBC 13.1* 12/24/2011 1450   HGB 14.3 08/14/2013 0818   HGB 9.5* 02/01/2013 1142   HGB 10.4* 08/18/2012 1354   HCT 43.1 08/14/2013 0818   HCT 31.6* 02/01/2013 1142   HCT 34.9* 08/18/2012 1354   MCV 89.8 08/14/2013 0818   MCV 86.9 12/31/2011 1300   MCV 87.7 12/24/2011 1450    Lipid Panel     Component Value Date/Time   CHOL 184 08/14/2013 0818   TRIG 108 08/14/2013 0818   HDL 44 08/14/2013 0818   CHOLHDL 4.2 08/14/2013 0818   VLDL 22 08/14/2013 0818   LDLCALC 118* 08/14/2013 0818    ABG No results found for: PHART, PCO2ART, PO2ART, HCO3, TCO2, ACIDBASEDEF, O2SAT   Lab Results  Component Value Date   TSH 2.397 08/14/2013   BNP (last 3 results) No results for input(s): BNP in the last 8760 hours.  ProBNP (last 3 results) No results for input(s): PROBNP in the last 8760 hours.  Cardiac Panel (last 3 results) No results for input(s): CKTOTAL, CKMB, TROPONINI, RELINDX in the last 72 hours.  Iron/TIBC/Ferritin/ %Sat No results found for: IRON, TIBC, FERRITIN, IRONPCTSAT   EKG Orders placed or performed in visit  on 05/30/15  . EKG 12-Lead     Prior Assessment and Plan Problem List as of 06/10/2015    Essential hypertension, benign  Last Assessment & Plan 06/07/2014 Office Visit Written 06/07/2014  9:12 AM by Satira Sark, MD    Blood pressure elevated today. No medication changes were made. Keep follow with primary care. Weight loss and walking regimen suggested.      Mixed hyperlipidemia   Last Assessment & Plan 06/07/2014 Office Visit Written 06/07/2014  9:11 AM by Satira Sark, MD    On statin therapy, labs follow to the [facility] medical system. Last LDL was 118. I asked him to forward next results when obtained.      Coronary atherosclerosis of native coronary artery   Last Assessment & Plan 06/07/2014 Office Visit Written 06/07/2014  9:10 AM by Satira Sark, MD    Symptomatically stable on medical therapy. We will request most recent office records from Dr. Rollene Fare including stress test results from last year. ECG reviewed today. No changes were made in medical regimen. He was given refills for fresh nitroglycerin. Followup arranged in 9 months.      COPD (chronic obstructive pulmonary disease)   Last Assessment & Plan 06/07/2014 Office Visit Written 06/07/2014  9:12 AM by Satira Sark, MD    Keep follow with primary care. We did provide a refill for Proventil MDI.      PAD (peripheral artery disease)   Last Assessment & Plan 06/07/2014 Office Visit Written 06/07/2014  9:11 AM by Satira Sark, MD    Moderate bilateral SFA disease without claudication.          Imaging: No results found.

## 2015-06-10 NOTE — Patient Instructions (Signed)
Your physician recommends that you schedule a follow-up appointment after TEE  Your physician has requested that you have a TEE/Cardioversion. During a TEE, sound waves are used to create images of your heart. It provides your doctor with information about the size and shape of your heart and how well your heart's chambers and valves are working. In this test, a transducer is attached to the end of a flexible tube that is guided down you throat and into your esophagus (the tube leading from your mouth to your stomach) to get a more detailed image of your heart. Once the TEE has determined that a blood clot is not present, the cardioversion begins. Electrical Cardioversion uses a jolt of electricity to your heart either through paddles or wired patches attached to your chest. This is a controlled, usually prescheduled, procedure. This procedure is done at the hospital and you are not awake during the procedure. You usually go home the day of the procedure. Please see the instruction sheet given to you today for more information.  Your physician recommends that you return for lab work in: ([place], BMET)  Thank you for choosing [facility]!

## 2015-06-10 NOTE — Addendum Note (Signed)
Addended by: Levonne Hubert on: 06/10/2015 03:04 PM   Modules accepted: Orders

## 2015-06-10 NOTE — Progress Notes (Signed)
Please contact patient concerning his metoprolol. Dr. Domenic Polite wants him to hold this medication the day of his TEE.DCCV. He has been sent written instructions concerning the procedure,but does not know about holding this medication. He can take all of his other medications that day.

## 2015-06-11 ENCOUNTER — Emergency Department (HOSPITAL_COMMUNITY)
Admission: EM | Admit: 2015-06-11 | Discharge: 2015-06-11 | Disposition: A | Discharge status: Home / Self Care | Payer: Medicare Other | Attending: Emergency Medicine | Admitting: Emergency Medicine

## 2015-06-11 ENCOUNTER — Encounter: Payer: Self-pay | Admitting: *Deleted

## 2015-06-11 ENCOUNTER — Encounter (HOSPITAL_COMMUNITY): Payer: Self-pay | Admitting: Emergency Medicine

## 2015-06-11 DIAGNOSIS — Z79899 Other long term (current) drug therapy: Secondary | ICD-10-CM | POA: Insufficient documentation

## 2015-06-11 DIAGNOSIS — S00512A Abrasion of oral cavity, initial encounter: Secondary | ICD-10-CM | POA: Diagnosis not present

## 2015-06-11 DIAGNOSIS — I10 Essential (primary) hypertension: Secondary | ICD-10-CM | POA: Insufficient documentation

## 2015-06-11 DIAGNOSIS — S0993XA Unspecified injury of face, initial encounter: Secondary | ICD-10-CM

## 2015-06-11 DIAGNOSIS — Y998 Other external cause status: Secondary | ICD-10-CM | POA: Insufficient documentation

## 2015-06-11 DIAGNOSIS — Z7951 Long term (current) use of inhaled steroids: Secondary | ICD-10-CM | POA: Diagnosis not present

## 2015-06-11 DIAGNOSIS — Z87891 Personal history of nicotine dependence: Secondary | ICD-10-CM | POA: Diagnosis not present

## 2015-06-11 DIAGNOSIS — R011 Cardiac murmur, unspecified: Secondary | ICD-10-CM | POA: Insufficient documentation

## 2015-06-11 DIAGNOSIS — I509 Heart failure, unspecified: Secondary | ICD-10-CM | POA: Insufficient documentation

## 2015-06-11 DIAGNOSIS — X58XXXA Exposure to other specified factors, initial encounter: Secondary | ICD-10-CM | POA: Insufficient documentation

## 2015-06-11 DIAGNOSIS — Y9389 Activity, other specified: Secondary | ICD-10-CM | POA: Insufficient documentation

## 2015-06-11 DIAGNOSIS — I2510 Atherosclerotic heart disease of native coronary artery without angina pectoris: Secondary | ICD-10-CM | POA: Insufficient documentation

## 2015-06-11 DIAGNOSIS — Y9289 Other specified places as the place of occurrence of the external cause: Secondary | ICD-10-CM | POA: Diagnosis not present

## 2015-06-11 DIAGNOSIS — J449 Chronic obstructive pulmonary disease, unspecified: Secondary | ICD-10-CM | POA: Diagnosis not present

## 2015-06-11 DIAGNOSIS — Z9889 Other specified postprocedural states: Secondary | ICD-10-CM | POA: Insufficient documentation

## 2015-06-11 DIAGNOSIS — K219 Gastro-esophageal reflux disease without esophagitis: Secondary | ICD-10-CM | POA: Insufficient documentation

## 2015-06-11 DIAGNOSIS — D509 Iron deficiency anemia, unspecified: Secondary | ICD-10-CM | POA: Diagnosis not present

## 2015-06-11 DIAGNOSIS — I252 Old myocardial infarction: Secondary | ICD-10-CM | POA: Diagnosis not present

## 2015-06-11 DIAGNOSIS — Z7901 Long term (current) use of anticoagulants: Secondary | ICD-10-CM | POA: Insufficient documentation

## 2015-06-11 DIAGNOSIS — Z951 Presence of aortocoronary bypass graft: Secondary | ICD-10-CM | POA: Diagnosis not present

## 2015-06-11 DIAGNOSIS — S00502A Unspecified superficial injury of oral cavity, initial encounter: Secondary | ICD-10-CM | POA: Diagnosis present

## 2015-06-11 NOTE — ED Notes (Signed)
Pt reports was eating earlier today and tongue began to bleed. Pt reports is on eloquis.no bleeding noted at this time.

## 2015-06-11 NOTE — ED Notes (Signed)
John Sampson at bedside. 

## 2015-06-12 ENCOUNTER — Telehealth: Payer: Self-pay | Admitting: *Deleted

## 2015-06-12 LAB — CBC WITH DIFFERENTIAL/PLATELET
BASOS PCT: 1 % (ref 0–1)
Basophils Absolute: 0.1 10*3/uL (ref 0.0–0.1)
Eosinophils Absolute: 0.2 10*3/uL (ref 0.0–0.7)
Eosinophils Relative: 2 % (ref 0–5)
HCT: 38.7 % — ABNORMAL LOW (ref 39.0–52.0)
Hemoglobin: 12.5 g/dL — ABNORMAL LOW (ref 13.0–17.0)
LYMPHS ABS: 1.4 10*3/uL (ref 0.7–4.0)
Lymphocytes Relative: 17 % (ref 12–46)
MCH: 29.7 pg (ref 26.0–34.0)
MCHC: 32.3 g/dL (ref 30.0–36.0)
MCV: 91.9 fL (ref 78.0–100.0)
MONO ABS: 0.8 10*3/uL (ref 0.1–1.0)
MPV: 10.2 fL (ref 8.6–12.4)
Monocytes Relative: 10 % (ref 3–12)
NEUTROS PCT: 70 % (ref 43–77)
Neutro Abs: 5.7 10*3/uL (ref 1.7–7.7)
Platelets: 263 10*3/uL (ref 150–400)
RBC: 4.21 MIL/uL — ABNORMAL LOW (ref 4.22–5.81)
RDW: 15.2 % (ref 11.5–15.5)
WBC: 8.2 10*3/uL (ref 4.0–10.5)

## 2015-06-12 LAB — BASIC METABOLIC PANEL
BUN: 15 mg/dL (ref 6–23)
CO2: 25 meq/L (ref 19–32)
Calcium: 9.7 mg/dL (ref 8.4–10.5)
Chloride: 104 mEq/L (ref 96–112)
Creat: 0.98 mg/dL (ref 0.50–1.35)
Glucose, Bld: 91 mg/dL (ref 70–99)
Potassium: 4.2 mEq/L (ref 3.5–5.3)
Sodium: 140 mEq/L (ref 135–145)

## 2015-06-12 NOTE — Telephone Encounter (Signed)
Attempted to call patient. No answer. Left voicemail.

## 2015-06-12 NOTE — Telephone Encounter (Signed)
-----   Message from Lendon Colonel, NP sent at 06/10/2015  5:01 PM EDT -----   ----- Message -----    From: Satira Sark, MD    Sent: 06/10/2015   4:28 PM      To: Lendon Colonel, NP  Saw your note. Please have him hold Toprol-XL on the morning of the cardioversion, he can continue Cardizem CD. Want to avoid post-cardioversion bradycardia.  ----- Message -----    From: Lendon Colonel, NP    Sent: 06/10/2015   2:47 PM      To: Satira Sark, MD

## 2015-06-13 NOTE — ED Provider Notes (Signed)
CSN: [id number]     Arrival date & time 06/11/15  1647 History   First MD Initiated Contact with Patient 06/11/15 1812     Chief Complaint  Patient presents with  . Lip Laceration     (Consider location/radiation/quality/duration/timing/severity/associated sxs/prior Treatment) HPI   John Sampson is a 79 y.o. male who takes Eliquis, presents to the Emergency Department complaining of persistent bleeding and injury to the right tongue.  He states that he accidentally bit his tongue while eating lunch.  Reports bleeding for some time that eventually stopped after cool compress.  He states that he is scheduled to have a cardiac procedure preformed next week and was concerned about the bleeding.  He denies difficulty swallowing, swelling of his tongue or dental injury.     Past Medical History  Diagnosis Date  . COPD (chronic obstructive pulmonary disease) 3.12.14    2D Echo, EF 50-55%  . GERD (gastroesophageal reflux disease)   . Headache(784.0)   . Coronary atherosclerosis of native coronary artery     Multivessel status post CABG  . Myocardial infarction   . PAD (peripheral artery disease)     Moderate bilateral SFA disease at angiography 01/2013  . Gastric ulcer     Small - nonbleeding  . Iron deficiency anemia     Negative Givens capsule study   . Pituitary macroadenoma   . HTN (hypertension) 01/12/11    Echo,EF =>55%, noted: moderate concentric LVH  . CHF (congestive heart failure) 2009 & 2003    Echo, EF =>55% Noted: LVS normal 2009/Echo, EF 58% normal LVSF  . Murmur 2008    Echo, EF =>55%, Noted: LV borderline dilated   Past Surgical History  Procedure Laterality Date  . Coronary artery bypass graft  2003    5 grafts - details not clear  . Eye surgery  2012  . Tonsillectomy  Age 65  . Craniotomy  12/31/2011    Procedure: CRANIOTOMY HYPOPHYSECTOMY TRANSNASAL APPROACH;  Surgeon: [name], MD;  Location: [facility];  Service: Neurosurgery;  Laterality: N/A;   Transphenoidal Hypophysectomy With Fat Graft Harvest from right abdomen   . Colonoscopy  08/18/2012    Procedure: COLONOSCOPY;  Surgeon: [name], MD;  Location: [facility];  Service: Endoscopy;  Laterality: N/A;  1:25  . Esophagogastroduodenoscopy  08/18/2012    Procedure: ESOPHAGOGASTRODUODENOSCOPY (EGD);  Surgeon: [name], MD;  Location: [facility];  Service: Endoscopy;  Laterality: N/A;  . Givens capsule study N/A 01/25/2013    Procedure: GIVENS CAPSULE STUDY;  Surgeon: [name], MD;  Location: [facility];  Service: Endoscopy;  Laterality: N/A;  730  . Lower extremity angiogram N/A 02/19/2013    Procedure: LOWER EXTREMITY ANGIOGRAM;  Surgeon: [name], MD;  Location: [facility] CATH LAB;  Service: Cardiovascular;  Laterality: N/A;  . Abdominal aortagram N/A 02/19/2013    Procedure: ABDOMINAL AORTAGRAM;  Surgeon: [name], MD;  Location: [facility] CATH LAB;  Service: Cardiovascular;  Laterality: N/A;  . Stress myocardial perfusion  12/08/2011    Indications: Abnormal EKG, Eval of prior GABG  . Cardiac catheterization  01/03/02    indications: CP,   . Pv angiogram  02/19/13    Indications: slow healing left calf ulcer   Family History  Problem Relation Age of Onset  . CAD Mother    History  Substance Use Topics  . Smoking status: Former Smoker -- 3.00 packs/day for 45 years    Types: Cigarettes  Start date: 01/24/1955    Quit date: 01/23/2002  . Smokeless tobacco: Never Used     Comment: Quit 11 yrs ago  . Alcohol Use: No    Review of Systems  Constitutional: Negative for activity change and appetite change.  HENT:       Injury to the tongue  All other systems reviewed and are negative.     Allergies  Review of patient's allergies indicates no known allergies.  Home Medications   Prior to Admission medications   Medication Sig Start Date End Date Taking? Authorizing Provider  acetaminophen (TYLENOL) 325 MG tablet Take 650 mg by mouth daily  as needed for headache.    Historical Provider, MD  albuterol (PROVENTIL HFA) 108 (90 BASE) MCG/ACT inhaler Inhale 2 puffs into the lungs every 6 (six) hours as needed for wheezing or shortness of breath. 11/15/14   Satira Sark, MD  apixaban (ELIQUIS) 5 MG TABS tablet Take 1 tablet (5 mg total) by mouth 2 (two) times daily. 05/30/15   Lendon Colonel, NP  diltiazem (CARDIZEM CD) 240 MG 24 hr capsule Take 1 capsule (240 mg total) by mouth daily. 05/30/15   Lendon Colonel, NP  ferrous sulfate 325 (65 FE) MG tablet Take 1 tablet (325 mg total) by mouth 2 (two) times daily with a meal. 01/23/13   [name], MD  fish oil-omega-3 fatty acids 1000 MG capsule Take 2 g by mouth daily.    Historical Provider, MD  Fluticasone Furoate-Vilanterol (BREO ELLIPTA IN) Inhale into the lungs.    Historical Provider, MD  folic acid (FOLVITE) 1 MG tablet Take 1 mg by mouth daily.     Historical Provider, MD  furosemide (LASIX) 40 MG tablet Take 1 tablet (40 mg total) by mouth daily as needed for fluid. 06/10/15   Lendon Colonel, NP  lisinopril (PRINIVIL,ZESTRIL) 10 MG tablet Take 1 tablet (10 mg total) by mouth daily. 05/30/15   Lendon Colonel, NP  metoprolol succinate (TOPROL-XL) 50 MG 24 hr tablet Take 1 tablet (50 mg total) by mouth daily. 05/30/15   Lendon Colonel, NP  nitroGLYCERIN (NITROSTAT) 0.4 MG SL tablet Place 1 tablet (0.4 mg total) under the tongue every 5 (five) minutes as needed for chest pain. 06/07/14   Satira Sark, MD  omeprazole (PRILOSEC) 20 MG capsule Take 20 mg by mouth 2 (two) times daily before a meal.    Historical Provider, MD  pantoprazole (PROTONIX) 40 MG tablet Take 40 mg by mouth 2 (two) times daily.     Historical Provider, MD  potassium chloride (MICRO-K) 10 MEQ CR capsule Take 10 mEq by mouth daily.     Historical Provider, MD  simvastatin (ZOCOR) 40 MG tablet Take 20 mg by mouth every evening.     Historical Provider, MD   BP 147/86 mmHg  Pulse 81  Temp(Src)  98.5 F (36.9 C) (Oral)  Resp 16  Ht 5\' 9"  (1.753 m)  Wt 230 lb (104.327 kg)  BMI 33.95 kg/m2  SpO2 98% Physical Exam  Constitutional: He is oriented to person, place, and time. He appears well-developed and well-nourished. No distress.  HENT:  Head: Normocephalic and atraumatic.  Mouth/Throat: Uvula is midline, oropharynx is clear and moist and mucous membranes are normal. No oral lesions. No trismus in the jaw. No uvula swelling.    Very small abrasion to lateral right tongue.  No dental injury , no active bleeding  Neck: Normal range of motion. Neck supple.  Cardiovascular: Normal rate and regular rhythm.   Pulmonary/Chest: Effort normal and breath sounds normal. No respiratory distress.  Musculoskeletal: Normal range of motion.  Neurological: He is alert and oriented to person, place, and time.  Psychiatric: He has a normal mood and affect.  Nursing note and vitals reviewed.   ED Course  Procedures (including critical care time) Labs Review Labs Reviewed - No data to display  Imaging Review No results found.   EKG Interpretation None      MDM   Final diagnoses:  Tongue injury, initial encounter    Minor abrasion to the lateral right tongue.  Bleeding controlled prior to arrival.  No dental injury.    Pt also seen by Dr. Lacinda Axon.  Pt agrees to soft foods and liquids for several days.  Application of ice if bleeding returns.      Kem Parkinson, PA-C 06/13/15 [place], MD 06/14/15 [phone]

## 2015-06-16 ENCOUNTER — Ambulatory Visit (HOSPITAL_COMMUNITY): Payer: Medicare Other | Admitting: Anesthesiology

## 2015-06-16 ENCOUNTER — Ambulatory Visit (HOSPITAL_BASED_OUTPATIENT_CLINIC_OR_DEPARTMENT_OTHER): Payer: Medicare Other

## 2015-06-16 ENCOUNTER — Encounter (HOSPITAL_COMMUNITY)
Admission: RE | Disposition: A | Discharge status: Home / Self Care | Payer: Self-pay | Source: Ambulatory Visit | Attending: Cardiology

## 2015-06-16 ENCOUNTER — Encounter (HOSPITAL_COMMUNITY): Payer: Self-pay | Admitting: *Deleted

## 2015-06-16 ENCOUNTER — Inpatient Hospital Stay (HOSPITAL_COMMUNITY)
Admission: RE | Admit: 2015-06-16 | Discharge: 2015-06-16 | Disposition: A | Discharge status: Home / Self Care | Payer: Medicare Other | Source: Ambulatory Visit | Attending: Adult Health | Admitting: Adult Health

## 2015-06-16 ENCOUNTER — Ambulatory Visit (HOSPITAL_COMMUNITY)
Admission: RE | Admit: 2015-06-16 | Discharge: 2015-06-16 | Disposition: A | Discharge status: Home / Self Care | Payer: Medicare Other | Source: Ambulatory Visit | Attending: Cardiology | Admitting: Cardiology

## 2015-06-16 DIAGNOSIS — Z951 Presence of aortocoronary bypass graft: Secondary | ICD-10-CM | POA: Insufficient documentation

## 2015-06-16 DIAGNOSIS — I340 Nonrheumatic mitral (valve) insufficiency: Secondary | ICD-10-CM

## 2015-06-16 DIAGNOSIS — Z7901 Long term (current) use of anticoagulants: Secondary | ICD-10-CM | POA: Insufficient documentation

## 2015-06-16 DIAGNOSIS — I509 Heart failure, unspecified: Secondary | ICD-10-CM | POA: Diagnosis not present

## 2015-06-16 DIAGNOSIS — I4891 Unspecified atrial fibrillation: Secondary | ICD-10-CM | POA: Diagnosis not present

## 2015-06-16 DIAGNOSIS — J449 Chronic obstructive pulmonary disease, unspecified: Secondary | ICD-10-CM | POA: Diagnosis not present

## 2015-06-16 DIAGNOSIS — I2510 Atherosclerotic heart disease of native coronary artery without angina pectoris: Secondary | ICD-10-CM | POA: Insufficient documentation

## 2015-06-16 DIAGNOSIS — I739 Peripheral vascular disease, unspecified: Secondary | ICD-10-CM | POA: Insufficient documentation

## 2015-06-16 DIAGNOSIS — I4892 Unspecified atrial flutter: Secondary | ICD-10-CM | POA: Diagnosis not present

## 2015-06-16 DIAGNOSIS — Z79899 Other long term (current) drug therapy: Secondary | ICD-10-CM | POA: Diagnosis not present

## 2015-06-16 DIAGNOSIS — I252 Old myocardial infarction: Secondary | ICD-10-CM | POA: Insufficient documentation

## 2015-06-16 DIAGNOSIS — I10 Essential (primary) hypertension: Secondary | ICD-10-CM | POA: Insufficient documentation

## 2015-06-16 DIAGNOSIS — I481 Persistent atrial fibrillation: Secondary | ICD-10-CM | POA: Diagnosis not present

## 2015-06-16 DIAGNOSIS — K219 Gastro-esophageal reflux disease without esophagitis: Secondary | ICD-10-CM | POA: Diagnosis not present

## 2015-06-16 DIAGNOSIS — Z87891 Personal history of nicotine dependence: Secondary | ICD-10-CM | POA: Diagnosis not present

## 2015-06-16 DIAGNOSIS — Z7982 Long term (current) use of aspirin: Secondary | ICD-10-CM | POA: Insufficient documentation

## 2015-06-16 HISTORY — PX: CARDIOVERSION: SHX1299

## 2015-06-16 HISTORY — PX: TEE WITHOUT CARDIOVERSION: SHX5443

## 2015-06-16 SURGERY — ECHOCARDIOGRAM, TRANSESOPHAGEAL
Anesthesia: Monitor Anesthesia Care

## 2015-06-16 MED ORDER — BUTAMBEN-TETRACAINE-BENZOCAINE 2-2-14 % EX AERO
INHALATION_SPRAY | CUTANEOUS | Status: AC
  Administered 2015-06-16: 2 via TOPICAL
  Filled 2015-06-16: qty 56

## 2015-06-16 MED ORDER — FENTANYL CITRATE (PF) 100 MCG/2ML IJ SOLN
INTRAMUSCULAR | Status: AC
  Filled 2015-06-16: qty 2

## 2015-06-16 MED ORDER — MIDAZOLAM HCL 2 MG/2ML IJ SOLN
INTRAMUSCULAR | Status: AC
  Filled 2015-06-16: qty 2

## 2015-06-16 MED ORDER — PROPOFOL 10 MG/ML IV BOLUS
INTRAVENOUS | Status: AC
  Filled 2015-06-16: qty 20

## 2015-06-16 MED ORDER — SODIUM CHLORIDE 0.9 % IJ SOLN: 3.0000 mL | INTRAMUSCULAR | Status: DC | PRN

## 2015-06-16 MED ORDER — SODIUM CHLORIDE 0.9 % IJ SOLN: 3.0000 mL | Freq: Two times a day (BID) | INTRAMUSCULAR | Status: DC

## 2015-06-16 MED ORDER — PROPOFOL 10 MG/ML IV BOLUS
INTRAVENOUS | Status: DC | PRN
  Administered 2015-06-16 (×4): 10.4 mg via INTRAVENOUS

## 2015-06-16 MED ORDER — FENTANYL CITRATE (PF) 100 MCG/2ML IJ SOLN
25.0000 ug | Freq: Once | INTRAMUSCULAR | Status: AC
  Administered 2015-06-16: 25 ug via INTRAVENOUS

## 2015-06-16 MED ORDER — LIDOCAINE VISCOUS 2 % MT SOLN
OROMUCOSAL | Status: AC
  Filled 2015-06-16: qty 15

## 2015-06-16 MED ORDER — ONDANSETRON HCL 4 MG/2ML IJ SOLN: 4.0000 mg | Freq: Once | INTRAMUSCULAR | Status: DC | PRN

## 2015-06-16 MED ORDER — LACTATED RINGERS IV SOLN
INTRAVENOUS | Status: DC
  Administered 2015-06-16: 09:00:00 via INTRAVENOUS

## 2015-06-16 MED ORDER — SODIUM CHLORIDE 0.9 % IV SOLN: 250.0000 mL | INTRAVENOUS | Status: DC

## 2015-06-16 MED ORDER — PROPOFOL INFUSION 10 MG/ML OPTIME
INTRAVENOUS | Status: DC | PRN
  Administered 2015-06-16 (×2): 75 ug/kg/min via INTRAVENOUS

## 2015-06-16 MED ORDER — FENTANYL CITRATE (PF) 100 MCG/2ML IJ SOLN
INTRAMUSCULAR | Status: DC | PRN
Start: 2015-06-16 — End: 2015-06-16
  Administered 2015-06-16: 25 ug via INTRAVENOUS

## 2015-06-16 MED ORDER — FENTANYL CITRATE (PF) 100 MCG/2ML IJ SOLN: 25.0000 ug | INTRAMUSCULAR | Status: DC | PRN

## 2015-06-16 MED ORDER — MIDAZOLAM HCL 2 MG/2ML IJ SOLN
INTRAMUSCULAR | Status: DC | PRN
  Administered 2015-06-16: 1 mg via INTRAVENOUS
  Administered 2015-06-16: 0.5 mg via INTRAVENOUS

## 2015-06-16 NOTE — Discharge Instructions (Signed)
Electrical Cardioversion, Care After Refer to this sheet in the next few weeks. These instructions provide you with information on caring for yourself after your procedure. Your health care provider may also give you more specific instructions. Your treatment has been planned according to current medical practices, but problems sometimes occur. Call your health care provider if you have any problems or questions after your procedure. WHAT TO EXPECT AFTER THE PROCEDURE After your procedure, it is typical to have the following sensations:  Some redness on the skin where the shocks were delivered. If this is tender, a sunburn lotion or hydrocortisone cream may help.  Possible return of an abnormal heart rhythm within hours or days after the procedure. HOME CARE INSTRUCTIONS  Take medicines only as directed by your health care provider. Be sure you understand how and when to take your medicine.  Learn how to feel your pulse and check it often.  Limit your activity for 48 hours after the procedure or as directed by your health care provider.  Avoid or minimize caffeine and other stimulants as directed by your health care provider. SEEK MEDICAL CARE IF:  You feel like your heart is beating too fast or your pulse is not regular.  You have any questions about your medicines.  You have bleeding that will not stop. SEEK IMMEDIATE MEDICAL CARE IF:  You are dizzy or feel faint.  It is hard to breathe or you feel short of breath.  There is a change in discomfort in your chest.  Your speech is slurred or you have trouble moving an arm or leg on one side of your body.  You get a serious muscle cramp that does not go away.  Your fingers or toes turn cold or blue. Document Released: 09/05/2013 Document Revised: 04/01/2014 Document Reviewed: 09/05/2013 [facility] Patient Information 2015 [place], [place]. This information is not intended to replace advice given to you by your health care provider.  Make sure you discuss any questions you have with your health care provider.  Monitored Anesthesia Care Monitored anesthesia care is an anesthesia service for a medical procedure. Anesthesia is the loss of the ability to feel pain. It is produced by medicines called anesthetics. It may affect a small area of your body (local anesthesia), a large area of your body (regional anesthesia), or your entire body (general anesthesia). The need for monitored anesthesia care depends your procedure, your condition, and the potential need for regional or general anesthesia. It is often provided during procedures where:   General anesthesia may be needed if there are complications. This is because you need special care when you are under general anesthesia.   You will be under local or regional anesthesia. This is so that you are able to have higher levels of anesthesia if needed.   You will receive calming medicines (sedatives). This is especially the case if sedatives are given to put you in a semi-conscious state of relaxation (deep sedation). This is because the amount of sedative needed to produce this state can be hard to predict. Too much of a sedative can produce general anesthesia. Monitored anesthesia care is performed by one or more health care providers who have special training in all types of anesthesia. You will need to meet with these health care providers before your procedure. During this meeting, they will ask you about your medical history. They will also give you instructions to follow. (For example, you will need to stop eating and drinking before your procedure. You  may also need to stop or change medicines you are taking.) During your procedure, your health care providers will stay with you. They will:   Watch your condition. This includes watching your blood pressure, breathing, and level of pain.   Diagnose and treat problems that occur.   Give medicines if they are needed. These may  include calming medicines (sedatives) and anesthetics.   Make sure you are comfortable.  Having monitored anesthesia care does not necessarily mean that you will be under anesthesia. It does mean that your health care providers will be able to manage anesthesia if you need it or if it occurs. It also means that you will be able to have a different type of anesthesia than you are having if you need it. When your procedure is complete, your health care providers will continue to watch your condition. They will make sure any medicines wear off before you are allowed to go home.  Document Released: 08/11/2005 Document Revised: 04/01/2014 Document Reviewed: 12/27/2012 [facility] Patient Information 2015 [place], [place]. This information is not intended to replace advice given to you by your health care provider. Make sure you discuss any questions you have with your health care provider.  PATIENT INSTRUCTIONS POST-ANESTHESIA  IMMEDIATELY FOLLOWING SURGERY:  Do not drive or operate machinery for the first twenty four hours after surgery.  Do not make any important decisions for twenty four hours after surgery or while taking narcotic pain medications or sedatives.  If you develop intractable nausea and vomiting or a severe headache please notify your doctor immediately.  FOLLOW-UP:  Please make an appointment with your surgeon as instructed. You do not need to follow up with anesthesia unless specifically instructed to do so.  QUESTIONS?:  Please feel free to call your physician or the hospital operator if you have any questions, and they will be happy to assist you.

## 2015-06-16 NOTE — Transfer of Care (Signed)
Immediate Anesthesia Transfer of Care Note  Patient: John Sampson  Procedure(s) Performed: Procedure(s): TRANSESOPHAGEAL ECHOCARDIOGRAM (TEE) (N/A) CARDIOVERSION (N/A)  Patient Location: PACU  Anesthesia Type:MAC  Level of Consciousness: sedated and patient cooperative  Airway & Oxygen Therapy: Patient Spontanous Breathing and non-rebreather face mask  Post-op Assessment: Report given to RN, Post -op Vital signs reviewed and stable and Patient moving all extremities  Post vital signs: Reviewed and stable    Complications: No apparent anesthesia complications

## 2015-06-16 NOTE — OR Nursing (Signed)
Electrical Cardioversion Procedure Note John Sampson [id number] June 28, 1936  Procedure: Electrical Cardioversion Indications:  Atrial Fibrillation  Procedure Details Consent: signed Time Out: Verified patient identification, verified procedure, site/side was marked, verified correct patient position, special equipment/implants available, medications/allergies/relevent history reviewed, required imaging and test results available.  Performed  Patient placed on cardiac monitor, pulse oximetry, supplemental oxygen as necessary.  Sedation given: by CRNA Helene Kelp Pacer pads placed anterior and posterior chest.  Cardioverted 1 time(s).  Cardioverted at 120J.  Evaluation Findings: Post procedure EKG shows: NSR Complications: None Patient did tolerate procedure well.   Boneta Lucks 06/16/2015, 10:24 AM

## 2015-06-16 NOTE — CV Procedure (Signed)
Elective TEE guided cardioversion  Indication: Persistent, symptomatic atrial fibrillation/flutter.  Informed consent was obtained, time out performed. Procedure was performed in the PACU. Anesthesia service provided sedation, please refer to their record for details. TEE was performed first without findings of intracardiac thrombus, please refer to full report to follow. Patient tolerated this procedure well. Next, cardioversion was performed. Using a biphasic defibrillator, a single synchronized 120 J shock was delivered via anterior and posterior pads, sandbag in place on the anterior pad. This resulted in restoration of sinus rhythm, confirmed by follow-up ECG. There were no immediate complications.  Satira Sark, M.D., F.A.C.C.

## 2015-06-16 NOTE — Anesthesia Preprocedure Evaluation (Signed)
Anesthesia Evaluation  Patient identified by MRN, date of birth, ID band Patient awake    Reviewed: Allergy & Precautions, H&P , NPO status , Patient's Chart, lab work & pertinent test results, reviewed documented beta blocker date and time   Airway Mallampati: II  TM Distance: >3 FB Neck ROM: Full    Dental  (+) Edentulous Upper, Edentulous Lower   Pulmonary shortness of breath and with exertion, COPD COPD inhaler, former smoker,    + wheezing      Cardiovascular hypertension, Pt. on medications and Pt. on home beta blockers + CAD, + Past MI, + CABG, + Peripheral Vascular Disease and +CHF + dysrhythmias Atrial Fibrillation Rhythm:Regular Rate:Normal - Systolic murmurs    Neuro/Psych  Headaches,    GI/Hepatic Neg liver ROS, PUD, GERD-  Medicated and Controlled,  Endo/Other  Morbid obesitytransphenoid hypophysectomy  Renal/GU negative Renal ROS     Musculoskeletal   Abdominal   Peds  Hematology   Anesthesia Other Findings   Reproductive/Obstetrics                             Anesthesia Physical Anesthesia Plan  ASA: IV  Anesthesia Plan: MAC   Post-op Pain Management:    Induction: Intravenous  Airway Management Planned: Simple Face Mask  Additional Equipment:   Intra-op Plan:   Post-operative Plan:   Informed Consent: I have reviewed the patients History and Physical, chart, labs and discussed the procedure including the risks, benefits and alternatives for the proposed anesthesia with the patient or authorized representative who has indicated his/her understanding and acceptance.     Plan Discussed with:   Anesthesia Plan Comments:         Anesthesia Quick Evaluation

## 2015-06-16 NOTE — Anesthesia Procedure Notes (Signed)
Procedure Name: MAC Date/Time: 06/16/2015 9:24 AM Performed by: Vista Deck Pre-anesthesia Checklist: Patient identified, Emergency Drugs available, Suction available, Timeout performed and Patient being monitored Patient Re-evaluated:Patient Re-evaluated prior to inductionOxygen Delivery Method: Non-rebreather mask

## 2015-06-16 NOTE — Anesthesia Postprocedure Evaluation (Signed)
  Anesthesia Post-op Note  Patient: John Sampson  Procedure(s) Performed: Procedure(s): TRANSESOPHAGEAL ECHOCARDIOGRAM (TEE) (N/A) CARDIOVERSION (N/A)  Patient Location: PACU  Anesthesia Type:MAC  Level of Consciousness: awake, alert  and patient cooperative  Airway and Oxygen Therapy: Patient Spontanous Breathing  Post-op Pain: none  Post-op Assessment: Post-op Vital signs reviewed, Patient's Cardiovascular Status Stable, Respiratory Function Stable and Patent Airway              Post-op Vital Signs: Reviewed and stable    Complications: No apparent anesthesia complications

## 2015-06-16 NOTE — H&P (Signed)
Cardiology Office Note   Date: 06/10/2015   ID: John Sampson, DOB Mar 28, 1936, MRN [id number]  PCP: Estelle June, PA-C Cardiologist: Journey Ratterman/ Jory Sims, NP   Chief Complaint  Patient presents with  . Coronary Artery Disease  . Hypertension     History of Present Illness: John Sampson is a 79 y.o. male who presents for ongoing assessment and management of CAD, hypertension with hx of advancing COPD. He was newly diagnosed with atrial flutter with RVR in the last office visit, HR 145 bpm. He was asymptomatic. He admitted to drinking a very large amount of caffeine. He refused hospitalization for IV treatment and rate control. He was increased on diltiazem dose to 240 mg and metoprolol was increased to 50 mg daily, with decrease in lisinopril to 10 mg daily. Added Eliquis 5 mg BID. He is here for follow up and to discuss need for TEE DCCV if necessary.   He comes today feeling much better. He cannot tell if he is in rhythm or still in atrial fib. He could not tell when he was rapid on last visit. He is tolerating the Eliquis, higher dose of diltiazem and metoprolol, and lower dose of lisinopril. He works part time as Freight forwarder at [employer] and states he is able to work his shift without fatigue or dyspnea. He has cut down on caffeine significantly.  Past Medical History  Diagnosis Date  . COPD (chronic obstructive pulmonary disease)   . Essential hypertension, benign   . GERD (gastroesophageal reflux disease)   . Headache(784.0)   . Coronary atherosclerosis of native coronary artery     Multivessel status post CABG  . Myocardial infarction   . PAD (peripheral artery disease)     Moderate bilateral SFA disease at angiography 01/2013  . Gastric ulcer     Small - nonbleeding  . Iron deficiency anemia     Negative Givens capsule study   . Pituitary macroadenoma     Past Surgical History  Procedure  Laterality Date  . Coronary artery bypass graft  2003    5 grafts - details not clear  . Eye surgery  2012  . Tonsillectomy  Age 15  . Craniotomy  12/31/2011    Procedure: CRANIOTOMY HYPOPHYSECTOMY TRANSNASAL APPROACH; Surgeon: [name], MD; Location: [facility]; Service: Neurosurgery; Laterality: N/A; Transphenoidal Hypophysectomy With Fat Graft Harvest from right abdomen   . Colonoscopy  08/18/2012    Procedure: COLONOSCOPY; Surgeon: [name], MD; Location: [facility]; Service: Endoscopy; Laterality: N/A; 1:25  . Esophagogastroduodenoscopy  08/18/2012    Procedure: ESOPHAGOGASTRODUODENOSCOPY (EGD); Surgeon: [name], MD; Location: [facility]; Service: Endoscopy; Laterality: N/A;  . Givens capsule study N/A 01/25/2013    Procedure: GIVENS CAPSULE STUDY; Surgeon: [name], MD; Location: [facility]; Service: Endoscopy; Laterality: N/A; 730  . Lower extremity angiogram N/A 02/19/2013    Procedure: LOWER EXTREMITY ANGIOGRAM; Surgeon: [name], MD; Location: [facility] C[facility] LAB; Service: Cardiovascular; Laterality: N/A;  . Abdominal aortagram N/A 02/19/2013    Procedure: ABDOMINAL AORTAGRAM; Surgeon: [name], MD; Location: [facility] CATH LAB; Service: Cardiovascular; Laterality: N/A;     Current Outpatient Prescriptions  Medication Sig Dispense Refill  . acetaminophen (TYLENOL) 325 MG tablet Take 650 mg by mouth daily as needed for headache.    . albuterol (PROVENTIL HFA) 108 (90 BASE) MCG/ACT inhaler Inhale 2 puffs into the lungs every 6 (six) hours as needed for wheezing or shortness of breath. 1  Inhaler 3  . albuterol-ipratropium (COMBIVENT) 18-103 MCG/ACT inhaler Inhale 2 puffs into the lungs daily.     [name] apixaban (ELIQUIS) 5 MG TABS tablet Take 1 tablet (5 mg total) by mouth 2 (two) times daily. 60 tablet 11  . aspirin EC 81 MG tablet Take 162 mg  by mouth daily.    [name] diltiazem (CARDIZEM CD) 240 MG 24 hr capsule Take 1 capsule (240 mg total) by mouth daily. 90 capsule 3  . ferrous sulfate 325 (65 FE) MG tablet Take 1 tablet (325 mg total) by mouth 2 (two) times daily with a meal.    . fish oil-omega-3 fatty acids 1000 MG capsule Take 2 g by mouth daily.    . Fluticasone Furoate-Vilanterol (BREO ELLIPTA IN) Inhale into the lungs.    . folic acid (FOLVITE) 1 MG tablet Take 1 mg by mouth daily.     . furosemide (LASIX) 40 MG tablet Take 40 mg by mouth daily as needed for fluid.     [name] lisinopril (PRINIVIL,ZESTRIL) 10 MG tablet Take 1 tablet (10 mg total) by mouth daily. 90 tablet 3  . metoprolol succinate (TOPROL-XL) 50 MG 24 hr tablet Take 1 tablet (50 mg total) by mouth daily. 30 tablet 11  . Multiple Vitamin (MULTIVITAMIN) tablet Take 1 tablet by mouth daily.    . nitroGLYCERIN (NITROSTAT) 0.4 MG SL tablet Place 1 tablet (0.4 mg total) under the tongue every 5 (five) minutes as needed for chest pain. 30 tablet 9  . omeprazole (PRILOSEC) 20 MG capsule Take 20 mg by mouth 2 (two) times daily before a meal.    . pantoprazole (PROTONIX) 40 MG tablet Take 40 mg by mouth 2 (two) times daily.     . potassium chloride (MICRO-K) 10 MEQ CR capsule Take 10 mEq by mouth daily.     . simvastatin (ZOCOR) 40 MG tablet Take 20 mg by mouth every evening.      No current facility-administered medications for this visit.    Allergies: Review of patient's allergies indicates no known allergies.    Social History: The patient  reports that he quit smoking about 13 years ago. His smoking use included Cigarettes. He started smoking about 60 years ago. He has a 135 pack-year smoking history. He has never used smokeless tobacco. He reports that he does not drink alcohol or use illicit drugs.   Family History: The patient's family history includes CAD in his mother.    ROS: .  All other systems are reviewed and negative.Unless otherwise mentioned in H&P above.   PHYSICAL EXAM: VS: BP 128/72 mmHg  Pulse 77  Ht 5\' 9"  (1.753 m)  Wt 235 lb 12.8 oz (106.958 kg)  BMI 34.81 kg/m2  SpO2 91% , BMI Body mass index is 34.81 kg/(m^2). GEN: Well nourished, well developed, in no acute distress  HEENT: normal  Neck: no JVD, carotid bruits, or masses Cardiac: IRRR; no murmurs, rubs, or gallops,no edema  Respiratory: Clear to auscultation bilaterally, normal work of breathing GI: soft, nontender, nondistended, + BS MS: no deformity or atrophy  Skin: warm and dry, no rash Neuro: Strength and sensation are intact Psych: euthymic mood, full affect   EKG:  The ekg ordered today demonstrates Atrial fib rate of 87 bpm.   Recent Labs: 06/03/2015: BUN 17; Creat 0.88; Magnesium 2.0; Potassium 4.4; Sodium 137    Lipid Panel  Labs (Brief)       Component Value Date/Time   CHOL 184 08/14/2013 0818  TRIG 108 08/14/2013 0818   HDL 44 08/14/2013 0818   CHOLHDL 4.2 08/14/2013 0818   VLDL 22 08/14/2013 0818   LDLCALC 118* 08/14/2013 0818       Wt Readings from Last 3 Encounters:  06/10/15 235 lb 12.8 oz (106.958 kg)  05/30/15 233 lb (105.688 kg)  01/24/15 215 lb (97.523 kg)       ASSESSMENT AND PLAN:  1. New Onset Atrial fib: Heart rate is well controlled now. He is tolerating medications adjustments and [place]. He remains in atrial fib. I will provide more samples of Eliquis as this is too expensive for him, and give him a Rx for 30 days for free. I have explained this to the patient and he is willing to proceed.   2. Hypertension: BP is tolerating meds without complaints or dizziness. He is compliant with his medications. He has some significant LEE. I have asked him to take his lasix prn due to this. He will be given Rx for lasix 40 mg prn as he was on this before.   3. CAD: Hx of CABG: No evidence of new ischemia on  EKG. Can consider repeat MPI once he is back in rhythm. Also consider sleep study.   Current medicines are reviewed at length with the patient today.   Labs/ tests ordered today include: BMET, CBC., Schedule TEE.DCCV as discussed with Dr. Domenic Polite when patient initially presented on 05/30/2015.  No orders of the defined types were placed in this encounter.    Disposition: FU with post procedure  Signed, Jory Sims, NP  06/10/2015 2:14 PM  [facility] [address] [place], [place] [address] Phone: [phone]; Fax: [phone]       Attending Addendum:  Patient seen and examined on July 18 prior to procedure. He is scheduled for a TEE guided cardioversion for persistent atrial fibrillation/flutter. He has been on Eliquis, uninterrupted. Recent ER visit after biting tongue accidentally noted, no significant injury besides minor abrasion. No difficulty swallowing. Recent lab work showed hemoglobin 12.5, platelets 263, potassium 4.2, BUN 15, creatinie 0.9.  Satira Sark, M.D., F.A.C.C.

## 2015-06-17 ENCOUNTER — Encounter (HOSPITAL_COMMUNITY): Payer: Self-pay | Admitting: Cardiology

## 2015-06-17 NOTE — Addendum Note (Signed)
Addendum  created 06/17/15 8144 by Vista Deck, CRNA   Modules edited: Charges VN

## 2015-07-01 DIAGNOSIS — H0012 Chalazion right lower eyelid: Secondary | ICD-10-CM | POA: Diagnosis not present

## 2015-07-09 ENCOUNTER — Encounter: Payer: Self-pay | Admitting: Cardiovascular Disease

## 2015-07-09 DIAGNOSIS — J449 Chronic obstructive pulmonary disease, unspecified: Secondary | ICD-10-CM | POA: Diagnosis not present

## 2015-07-09 DIAGNOSIS — I4891 Unspecified atrial fibrillation: Secondary | ICD-10-CM | POA: Diagnosis not present

## 2015-07-09 DIAGNOSIS — I10 Essential (primary) hypertension: Secondary | ICD-10-CM | POA: Diagnosis not present

## 2015-07-09 DIAGNOSIS — I2510 Atherosclerotic heart disease of native coronary artery without angina pectoris: Secondary | ICD-10-CM | POA: Diagnosis not present

## 2015-07-17 ENCOUNTER — Encounter: Payer: Self-pay | Admitting: Cardiology

## 2015-07-17 ENCOUNTER — Ambulatory Visit (INDEPENDENT_AMBULATORY_CARE_PROVIDER_SITE_OTHER): Payer: Medicare Other | Admitting: Cardiology

## 2015-07-17 VITALS — BP 150/78 | HR 60 | Ht 69.0 in | Wt 237.0 lb

## 2015-07-17 DIAGNOSIS — Z79899 Other long term (current) drug therapy: Secondary | ICD-10-CM | POA: Diagnosis not present

## 2015-07-17 DIAGNOSIS — I483 Typical atrial flutter: Secondary | ICD-10-CM

## 2015-07-17 DIAGNOSIS — I2510 Atherosclerotic heart disease of native coronary artery without angina pectoris: Secondary | ICD-10-CM | POA: Diagnosis not present

## 2015-07-17 MED ORDER — SIMVASTATIN 40 MG PO TABS: 40.0000 mg | ORAL_TABLET | Freq: Every day | ORAL | Status: DC

## 2015-07-17 NOTE — Patient Instructions (Signed)
Your physician wants you to follow-up in: 6 months with Dr Ferne Reus will receive a reminder letter in the mail two months in advance. If you don't receive a letter, please call our office to schedule the follow-up appointment.    Your physician recommends that you continue on your current medications as directed. Please refer to the Current Medication list given to you today.    Please have lab work done 2 weeks before next visit     Thank you for choosing [facility] !

## 2015-07-17 NOTE — Progress Notes (Signed)
Cardiology Office Note  Date: 07/17/2015   ID: John Sampson, DOB [birth year], MRN [id number]  PCP: Estelle June, PA-C  Primary Cardiologist: Rozann Lesches, MD   Chief Complaint  Patient presents with  . Follow-up cardioversion    History of Present Illness: John Sampson is a 79 y.o. male last seen by Ms. Lawrence NP in July. He was seen at that time with persistent atrial flutter and scheduled for a TEE cardioversion. Procedure was performed on July 18 with successful restoration of sinus rhythm. He comes in today for follow-up, has had no palpitations, and continues on the regimen outlined below. He reports no bleeding issues with Eliquis.  Most recent lab work reviewed and outlined below.  Past Medical History  Diagnosis Date  . COPD (chronic obstructive pulmonary disease) 3.12.14    2D Echo, EF 50-55%  . GERD (gastroesophageal reflux disease)   . Headache(784.0)   . Coronary atherosclerosis of native coronary artery     Multivessel status post CABG  . Myocardial infarction   . PAD (peripheral artery disease)     Moderate bilateral SFA disease at angiography 01/2013  . Gastric ulcer     Small - nonbleeding  . Iron deficiency anemia     Negative Givens capsule study   . Pituitary macroadenoma   . Atrial flutter     Current Outpatient Prescriptions  Medication Sig Dispense Refill  . acetaminophen (TYLENOL) 325 MG tablet Take 650 mg by mouth daily as needed for headache.    . albuterol (PROVENTIL HFA) 108 (90 BASE) MCG/ACT inhaler Inhale 2 puffs into the lungs every 6 (six) hours as needed for wheezing or shortness of breath. 1 Inhaler 3  . apixaban (ELIQUIS) 5 MG TABS tablet Take 1 tablet (5 mg total) by mouth 2 (two) times daily. 60 tablet 11  . diltiazem (CARDIZEM CD) 240 MG 24 hr capsule Take 1 capsule (240 mg total) by mouth daily. 90 capsule 3  . ferrous sulfate 325 (65 FE) MG tablet Take 1 tablet (325 mg total) by mouth 2 (two) times daily with a meal.    . fish  oil-omega-3 fatty acids 1000 MG capsule Take 2 g by mouth daily.    . Fluticasone Furoate-Vilanterol (BREO ELLIPTA IN) Inhale into the lungs.    . folic acid (FOLVITE) 1 MG tablet Take 1 mg by mouth daily.     . furosemide (LASIX) 40 MG tablet Take 1 tablet (40 mg total) by mouth daily as needed for fluid. 30 tablet 6  . lisinopril (PRINIVIL,ZESTRIL) 10 MG tablet Take 1 tablet (10 mg total) by mouth daily. 90 tablet 3  . metoprolol succinate (TOPROL-XL) 50 MG 24 hr tablet Take 1 tablet (50 mg total) by mouth daily. 30 tablet 11  . nitroGLYCERIN (NITROSTAT) 0.4 MG SL tablet Place 1 tablet (0.4 mg total) under the tongue every 5 (five) minutes as needed for chest pain. 30 tablet 9  . omeprazole (PRILOSEC) 20 MG capsule Take 20 mg by mouth 2 (two) times daily before a meal.    . pantoprazole (PROTONIX) 40 MG tablet Take 40 mg by mouth 2 (two) times daily.     . potassium chloride (MICRO-K) 10 MEQ CR capsule Take 10 mEq by mouth daily.     . simvastatin (ZOCOR) 40 MG tablet Take 1 tablet (40 mg total) by mouth daily. 30 tablet 6   No current facility-administered medications for this visit.    Allergies:  Review of patient's allergies indicates  no known allergies.   Social History: The patient  reports that he quit smoking about 13 years ago. His smoking use included Cigarettes. He started smoking about 60 years ago. He has a 135 pack-year smoking history. He has never used smokeless tobacco. He reports that he does not drink alcohol or use illicit drugs.   ROS:  Please see the history of present illness. Otherwise, complete review of systems is positive for chronic, stable dyspnea on exertion.  All other systems are reviewed and negative.   Physical Exam: VS:  BP 150/78 mmHg  Pulse 60  Ht 5\' 9"  (1.753 m)  Wt 237 lb (107.502 kg)  BMI 34.98 kg/m2  SpO2 77%, BMI Body mass index is 34.98 kg/(m^2).  Wt Readings from Last 3 Encounters:  07/17/15 237 lb (107.502 kg)  06/11/15 230 lb (104.327 kg)   06/10/15 235 lb 12.8 oz (106.958 kg)     General: Patient appears comfortable at rest. HEENT: Conjunctiva and lids normal, oropharynx clear with moist mucosa. Neck: Supple, no elevated JVP or carotid bruits, no thyromegaly. Lungs: Clear to auscultation, nonlabored breathing at rest. Cardiac: Regular rate and rhythm, no S3 or significant systolic murmur, no pericardial rub. Abdomen: Soft, nontender, no hepatomegaly, bowel sounds present, no guarding or rebound. Extremities: No pitting edema, distal pulses 2+.  ECG: ECG is not ordered today.   Recent Labwork: 06/03/2015: Magnesium 2.0 06/10/2015: BUN 15; Creat 0.98; Hemoglobin 12.5*; Platelets 263; Potassium 4.2; Sodium 140   Assessment and Plan:  1. Persistent atrial flutter, now status post TEE cardioversion and maintaining sinus rhythm. Plan is to continue anticoagulation with Eliquis, no change in rate control strategy.  2. Multivessel CAD status post CABG, symptomatically stable on medical therapy.  Current medicines were reviewed with the patient today.   Orders Placed This Encounter  Procedures  . CBC  . Basic Metabolic Panel (BMET)    Disposition: FU with me in 6 months.   Signed, Satira Sark, MD, [facility] 07/17/2015 9:45 AM    [facility], [place], [place] [address] Phone: [phone]; Fax: [phone]

## 2015-10-09 DIAGNOSIS — R609 Edema, unspecified: Secondary | ICD-10-CM | POA: Diagnosis not present

## 2015-10-09 DIAGNOSIS — I2510 Atherosclerotic heart disease of native coronary artery without angina pectoris: Secondary | ICD-10-CM | POA: Diagnosis not present

## 2015-10-09 DIAGNOSIS — J449 Chronic obstructive pulmonary disease, unspecified: Secondary | ICD-10-CM | POA: Diagnosis not present

## 2015-10-09 DIAGNOSIS — I10 Essential (primary) hypertension: Secondary | ICD-10-CM | POA: Diagnosis not present

## 2015-11-11 ENCOUNTER — Other Ambulatory Visit (HOSPITAL_COMMUNITY): Payer: Self-pay | Admitting: Podiatry

## 2015-11-11 DIAGNOSIS — M25579 Pain in unspecified ankle and joints of unspecified foot: Secondary | ICD-10-CM

## 2015-11-11 DIAGNOSIS — L03032 Cellulitis of left toe: Secondary | ICD-10-CM | POA: Diagnosis not present

## 2015-11-11 DIAGNOSIS — L600 Ingrowing nail: Secondary | ICD-10-CM | POA: Diagnosis not present

## 2015-11-11 DIAGNOSIS — L03031 Cellulitis of right toe: Secondary | ICD-10-CM | POA: Diagnosis not present

## 2015-11-27 ENCOUNTER — Ambulatory Visit (HOSPITAL_COMMUNITY)
Admission: RE | Admit: 2015-11-27 | Discharge: 2015-11-27 | Disposition: A | Discharge status: Home / Self Care | Payer: Medicare Other | Source: Ambulatory Visit | Attending: Podiatry | Admitting: Podiatry

## 2015-11-27 DIAGNOSIS — M25579 Pain in unspecified ankle and joints of unspecified foot: Secondary | ICD-10-CM | POA: Diagnosis present

## 2015-11-27 DIAGNOSIS — I739 Peripheral vascular disease, unspecified: Secondary | ICD-10-CM | POA: Insufficient documentation

## 2015-11-27 DIAGNOSIS — M79672 Pain in left foot: Secondary | ICD-10-CM | POA: Diagnosis not present

## 2015-11-27 DIAGNOSIS — M79671 Pain in right foot: Secondary | ICD-10-CM | POA: Insufficient documentation

## 2015-11-27 DIAGNOSIS — M79605 Pain in left leg: Secondary | ICD-10-CM | POA: Insufficient documentation

## 2015-11-27 DIAGNOSIS — M79604 Pain in right leg: Secondary | ICD-10-CM | POA: Insufficient documentation

## 2015-12-01 ENCOUNTER — Encounter (HOSPITAL_COMMUNITY): Payer: Self-pay | Admitting: *Deleted

## 2015-12-01 ENCOUNTER — Emergency Department (HOSPITAL_COMMUNITY): Payer: Medicare Other

## 2015-12-01 ENCOUNTER — Emergency Department (HOSPITAL_COMMUNITY)
Admission: EM | Admit: 2015-12-01 | Discharge: 2015-12-01 | Disposition: A | Discharge status: Home / Self Care | Payer: Medicare Other | Attending: Emergency Medicine | Admitting: Emergency Medicine

## 2015-12-01 DIAGNOSIS — Z87891 Personal history of nicotine dependence: Secondary | ICD-10-CM | POA: Insufficient documentation

## 2015-12-01 DIAGNOSIS — M79604 Pain in right leg: Secondary | ICD-10-CM | POA: Diagnosis not present

## 2015-12-01 DIAGNOSIS — K219 Gastro-esophageal reflux disease without esophagitis: Secondary | ICD-10-CM | POA: Insufficient documentation

## 2015-12-01 DIAGNOSIS — D509 Iron deficiency anemia, unspecified: Secondary | ICD-10-CM | POA: Insufficient documentation

## 2015-12-01 DIAGNOSIS — I252 Old myocardial infarction: Secondary | ICD-10-CM | POA: Diagnosis not present

## 2015-12-01 DIAGNOSIS — R2241 Localized swelling, mass and lump, right lower limb: Secondary | ICD-10-CM | POA: Diagnosis not present

## 2015-12-01 DIAGNOSIS — Z79899 Other long term (current) drug therapy: Secondary | ICD-10-CM | POA: Diagnosis not present

## 2015-12-01 DIAGNOSIS — I4892 Unspecified atrial flutter: Secondary | ICD-10-CM | POA: Insufficient documentation

## 2015-12-01 DIAGNOSIS — Z951 Presence of aortocoronary bypass graft: Secondary | ICD-10-CM | POA: Diagnosis not present

## 2015-12-01 DIAGNOSIS — R21 Rash and other nonspecific skin eruption: Secondary | ICD-10-CM | POA: Insufficient documentation

## 2015-12-01 DIAGNOSIS — M7989 Other specified soft tissue disorders: Secondary | ICD-10-CM | POA: Insufficient documentation

## 2015-12-01 DIAGNOSIS — J441 Chronic obstructive pulmonary disease with (acute) exacerbation: Secondary | ICD-10-CM | POA: Insufficient documentation

## 2015-12-01 DIAGNOSIS — I2510 Atherosclerotic heart disease of native coronary artery without angina pectoris: Secondary | ICD-10-CM | POA: Diagnosis not present

## 2015-12-01 DIAGNOSIS — Z86018 Personal history of other benign neoplasm: Secondary | ICD-10-CM | POA: Insufficient documentation

## 2015-12-01 MED ORDER — TRAMADOL HCL 50 MG PO TABS: 50.0000 mg | ORAL_TABLET | Freq: Four times a day (QID) | ORAL | Status: DC | PRN

## 2015-12-01 NOTE — ED Notes (Signed)
Pt states right leg is swelling and having some drainage. States he had an ultrasound Thursday of same leg.

## 2015-12-01 NOTE — ED Provider Notes (Signed)
CSN: [id number]     Arrival date & time 12/01/15  1019 History  By signing my name below, I, John Sampson, attest that this documentation has been prepared under the direction and in the presence of [name], MD. Electronically Signed: Rayna Sampson, ED Scribe. 12/01/2015. 12:47 PM.   Chief Complaint  Patient presents with  . Leg Swelling   The history is provided by the patient. No language interpreter was used.    HPI Comments: John Sampson is a 80 y.o. male who presents to the Emergency Department complaining of worsening, moderate, swelling to his right LE with onset in the past week. He notes some associated drainage from the affected leg and intermittent, moderate, pain that causes difficulty sleeping. He notes having an US performed 4 days ago to his right LE and is unsure of his results. Pt denies taking anything for pain management. Pt endorses chronic SOB due to his COPD and rash to bilateral LE's. Pt notes having a vein removed from the right leg. He notes taking aspirin daily but denies any other blood thinning medications. He denies fever, chills, visual disturbances, cold like symptoms, abd pain, CP, n/v/d, constipation, urinary issues, back pain, hx of bleeding easily, HA and any other associated symptoms at this time.   Podiatrist: Dr. Berline Lopes in [place]  Past Medical History  Diagnosis Date  . COPD (chronic obstructive pulmonary disease) ([place]) 3.12.14    2D Echo, EF 50-55%  . GERD (gastroesophageal reflux disease)   . Headache(784.0)   . Coronary atherosclerosis of native coronary artery     Multivessel status post CABG  . Myocardial infarction ([place])   . PAD (peripheral artery disease) ([facility])     Moderate bilateral SFA disease at angiography 01/2013  . Gastric ulcer     Small - nonbleeding  . Iron deficiency anemia     Negative Givens capsule study   . Pituitary macroadenoma ([place])   . Atrial flutter [facility])    Past Surgical History  Procedure Laterality Date  .  Coronary artery bypass graft  2003    5 grafts - details not clear  . Eye surgery  2012  . Tonsillectomy  Age 10  . Craniotomy  12/31/2011    Procedure: CRANIOTOMY HYPOPHYSECTOMY TRANSNASAL APPROACH;  Surgeon: [name], MD;  Location: [facility] NEURO ORS;  Service: Neurosurgery;  Laterality: N/A;  Transphenoidal Hypophysectomy With Fat Graft Harvest from right abdomen   . Colonoscopy  08/18/2012    Procedure: COLONOSCOPY;  Surgeon: [name], MD;  [facility];  Service: Endoscopy;  Laterality: N/A;  1:25  . Esophagogastroduodenoscopy  08/18/2012    Procedure: ESOPHAGOGASTRODUODENOSCOPY (EGD);  Surgeon: [name], MD;  [facility];  Service: Endoscopy;  Laterality: N/A;  . Givens capsule study N/A 01/25/2013    Procedure: GIVENS CAPSULE STUDY;  Surgeon: [name], MD;  [facility];  Service: Endoscopy;  Laterality: N/A;  730  . Lower extremity angiogram N/A 02/19/2013    Procedure: LOWER EXTREMITY ANGIOGRAM;  Surgeon: [name], MD;  Location: [facility] CATH LAB;  Service: Cardiovascular;  Laterality: N/A;  . Abdominal aortagram N/A 02/19/2013    Procedure: ABDOMINAL AORTAGRAM;  Surgeon: [name], MD;  Location: [facility] CATH LAB;  Service: Cardiovascular;  Laterality: N/A;  . Stress myocardial perfusion  12/08/2011    Indications: Abnormal EKG, Eval of prior GABG  . Pv angiogram  02/19/13    Indications: slow healing left calf ulcer  .  Tee without cardioversion N/A 06/16/2015    Procedure: TRANSESOPHAGEAL ECHOCARDIOGRAM (TEE);  Surgeon: Satira Sark, MD;  Location: [facility] ORS;  Service: Cardiovascular;  Laterality: N/A;  . Cardioversion N/A 06/16/2015    Procedure: CARDIOVERSION;  Surgeon: Satira Sark, MD;  Location: [facility] ORS;  Service: Cardiovascular;  Laterality: N/A;   Family History  Problem Relation Age of Onset  . CAD Mother    Social History  Substance Use Topics  . Smoking status: Former Smoker -- 3.00 packs/day for 45 years     Types: Cigarettes    Start date: 01/24/1955    Quit date: 01/23/2002  . Smokeless tobacco: Never Used     Comment: Quit 11 yrs ago  . Alcohol Use: No     Review of Systems  Constitutional: Negative for fever and chills.  HENT: Negative for congestion, postnasal drip, rhinorrhea, sinus pressure and sore throat.   Eyes: Negative for visual disturbance.  Respiratory: Positive for shortness of breath. Negative for cough.   Cardiovascular: Positive for leg swelling. Negative for chest pain.  Gastrointestinal: Negative for nausea, vomiting, abdominal pain and diarrhea.  Genitourinary: Negative for dysuria and hematuria.  Musculoskeletal: Negative for back pain.  Skin: Positive for rash.  Neurological: Negative for syncope, light-headedness and headaches.  Hematological: Does not bruise/bleed easily.  Psychiatric/Behavioral: Negative for confusion.  All other systems reviewed and are negative.   Allergies  Review of patient's allergies indicates no known allergies.  Home Medications   Prior to Admission medications   Medication Sig Start Date End Date Taking? Authorizing Provider  acetaminophen (TYLENOL) 325 MG tablet Take 650 mg by mouth daily as needed for headache.   Yes Historical Provider, MD  albuterol (PROVENTIL HFA) 108 (90 BASE) MCG/ACT inhaler Inhale 2 puffs into the lungs every 6 (six) hours as needed for wheezing or shortness of breath. 11/15/14  Yes Satira Sark, MD  diltiazem (CARDIZEM CD) 240 MG 24 hr capsule Take 1 capsule (240 mg total) by mouth daily. 05/30/15  Yes Lendon Colonel, NP  ferrous sulfate 325 (65 FE) MG tablet Take 1 tablet (325 mg total) by mouth 2 (two) times daily with a meal. 01/23/13  Yes [name], MD  fish oil-omega-3 fatty acids 1000 MG capsule Take 2 g by mouth daily.   Yes Historical Provider, MD  Fluticasone Furoate-Vilanterol (BREO ELLIPTA IN) Inhale 1 puff into the lungs daily.    Yes Historical Provider, MD  folic acid (FOLVITE) 1  MG tablet Take 1 mg by mouth daily.    Yes Historical Provider, MD  furosemide (LASIX) 40 MG tablet Take 1 tablet (40 mg total) by mouth daily as needed for fluid. 06/10/15  Yes Lendon Colonel, NP  lisinopril (PRINIVIL,ZESTRIL) 10 MG tablet Take 1 tablet (10 mg total) by mouth daily. 05/30/15  Yes Lendon Colonel, NP  metoprolol succinate (TOPROL-XL) 50 MG 24 hr tablet Take 1 tablet (50 mg total) by mouth daily. 05/30/15  Yes Lendon Colonel, NP  nitroGLYCERIN (NITROSTAT) 0.4 MG SL tablet Place 1 tablet (0.4 mg total) under the tongue every 5 (five) minutes as needed for chest pain. 06/07/14  Yes Satira Sark, MD  omeprazole (PRILOSEC) 20 MG capsule Take 20 mg by mouth 2 (two) times daily before a meal.   Yes Historical Provider, MD  pantoprazole (PROTONIX) 40 MG tablet Take 40 mg by mouth 2 (two) times daily.    Yes Historical Provider, MD  potassium chloride (MICRO-K) 10 MEQ CR capsule  Take 10 mEq by mouth daily.    Yes Historical Provider, MD  simvastatin (ZOCOR) 40 MG tablet Take 1 tablet (40 mg total) by mouth daily. 07/17/15  Yes Satira Sark, MD  traMADol (ULTRAM) 50 MG tablet Take 1 tablet (50 mg total) by mouth every 6 (six) hours as needed. 12/01/15   [name], MD   BP 162/61 mmHg  Pulse 62  Temp(Src) 98.6 F (37 C) (Oral)  Resp 16  Ht 5\' 9"  (1.753 m)  Wt 108.863 kg  BMI 35.43 kg/m2  SpO2 98%    Physical Exam  Constitutional: He is oriented to person, place, and time. He appears well-developed and well-nourished. No distress.  HENT:  Head: Normocephalic and atraumatic.  Mouth/Throat: Oropharynx is clear and moist.  Eyes: Conjunctivae and EOM are normal. Pupils are equal, round, and reactive to light. No scleral icterus.  Neck: Normal range of motion. Neck supple.  Cardiovascular: Normal rate, regular rhythm, normal heart sounds and intact distal pulses.   No murmur heard. Pulmonary/Chest: Effort normal and breath sounds normal. No respiratory distress. He  has no wheezes.  Abdominal: Soft. Bowel sounds are normal. There is no tenderness.  Musculoskeletal: Normal range of motion. He exhibits edema.  Right leg with significant swelling from knee down; minimal swelling of right foot; cap refill 1 second; toes are slightly purple; foot is warm; no palpable DP pulse; 2 cm scab oval shaped ulcer; scar from vein removal is well healed Left leg: faint palpable DP pulse; both LE have chronic skin changes and redness;   Neurological: He is alert and oriented to person, place, and time. No cranial nerve deficit. He exhibits normal muscle tone. Coordination normal.  Skin: Skin is warm and dry.  Psychiatric: He has a normal mood and affect. His behavior is normal.  Nursing note and vitals reviewed.   ED Course  Procedures  DIAGNOSTIC STUDIES: Oxygen Saturation is 100% on RA, normal by my interpretation.    COORDINATION OF CARE: 12:43 PM Discussed next steps with pt and he agreed to the plan.   Labs Review Labs Reviewed - No data to display  Imaging Review US Venous Img Lower Unilateral Right  12/01/2015  CLINICAL DATA:  80 year old male with right lower extremity pain and swelling. EXAM: RIGHT LOWER EXTREMITY VENOUS DOPPLER ULTRASOUND TECHNIQUE: Gray-scale sonography with graded compression, as well as color Doppler and duplex ultrasound were performed to evaluate the lower extremity deep venous systems from the level of the common femoral vein and including the common femoral, femoral, profunda femoral, popliteal and calf veins including the posterior tibial, peroneal and gastrocnemius veins when visible. The superficial great saphenous vein was also interrogated. Spectral Doppler was utilized to evaluate flow at rest and with distal augmentation maneuvers in the common femoral, femoral and popliteal veins. COMPARISON:  None. FINDINGS: Deep venous system appears patent and compressible from groin through popliteal fossae bilaterally. Spontaneous venous flow  present with evidence of respiratory phasicity. Augmentation intact. No intraluminal thrombus identified. Visualized portions of the greater saphenous veins patent bilaterally. The left common femoral vein is patent without DVT. IMPRESSION: No evidence of right lower extremity deep venous thrombosis. Electronically Signed   By: Margarette Canada M.D.   On: 12/01/2015 14:47   I have personally reviewed and evaluated these images and lab results as part of my medical decision-making.   EKG Interpretation None      MDM   Final diagnoses:  Right leg swelling   Doppler study of for  any venous abnormalities without any significant findings. No evidence of deep vein thrombosis. As we discussed  arterial Doppler had the earlier does show evidence of some arterial insufficiency. Recommend follow-up well with your regular doctors for further evaluation of the leg swelling. Take the tramadol as directed.  I personally performed the services described in this documentation, which was scribed in my presence. The recorded information has been reviewed and is accurate.      [name], MD 12/01/15 [phone]

## 2015-12-01 NOTE — Discharge Instructions (Signed)
Doppler study shows no evidence of any of clots in the veins. As we discussed your arterial Doppler study done by her podiatrist does show some arterial insufficiency recommend that you follow back up with them. Take the tramadol as needed for pain. Return for any new or worse symptoms.

## 2015-12-09 DIAGNOSIS — L03031 Cellulitis of right toe: Secondary | ICD-10-CM | POA: Diagnosis not present

## 2015-12-09 DIAGNOSIS — L03032 Cellulitis of left toe: Secondary | ICD-10-CM | POA: Diagnosis not present

## 2015-12-09 DIAGNOSIS — M25579 Pain in unspecified ankle and joints of unspecified foot: Secondary | ICD-10-CM | POA: Diagnosis not present

## 2015-12-09 DIAGNOSIS — L600 Ingrowing nail: Secondary | ICD-10-CM | POA: Diagnosis not present

## 2015-12-10 DIAGNOSIS — J449 Chronic obstructive pulmonary disease, unspecified: Secondary | ICD-10-CM | POA: Diagnosis not present

## 2015-12-10 DIAGNOSIS — I10 Essential (primary) hypertension: Secondary | ICD-10-CM | POA: Diagnosis not present

## 2015-12-10 DIAGNOSIS — I4891 Unspecified atrial fibrillation: Secondary | ICD-10-CM | POA: Diagnosis not present

## 2015-12-10 DIAGNOSIS — I779 Disorder of arteries and arterioles, unspecified: Secondary | ICD-10-CM | POA: Diagnosis not present

## 2015-12-15 ENCOUNTER — Other Ambulatory Visit: Payer: Self-pay

## 2015-12-15 DIAGNOSIS — I83009 Varicose veins of unspecified lower extremity with ulcer of unspecified site: Secondary | ICD-10-CM

## 2015-12-15 DIAGNOSIS — I739 Peripheral vascular disease, unspecified: Secondary | ICD-10-CM

## 2015-12-15 DIAGNOSIS — L97909 Non-pressure chronic ulcer of unspecified part of unspecified lower leg with unspecified severity: Secondary | ICD-10-CM

## 2015-12-19 ENCOUNTER — Ambulatory Visit (INDEPENDENT_AMBULATORY_CARE_PROVIDER_SITE_OTHER)
Admission: RE | Admit: 2015-12-19 | Discharge: 2015-12-19 | Disposition: A | Discharge status: Home / Self Care | Payer: Medicare Other | Source: Ambulatory Visit | Attending: Vascular Surgery | Admitting: Vascular Surgery

## 2015-12-19 ENCOUNTER — Ambulatory Visit (HOSPITAL_COMMUNITY)
Admission: RE | Admit: 2015-12-19 | Discharge: 2015-12-19 | Disposition: A | Discharge status: Home / Self Care | Payer: Medicare Other | Source: Ambulatory Visit | Attending: Vascular Surgery | Admitting: Vascular Surgery

## 2015-12-19 DIAGNOSIS — I739 Peripheral vascular disease, unspecified: Secondary | ICD-10-CM | POA: Insufficient documentation

## 2015-12-19 DIAGNOSIS — I83009 Varicose veins of unspecified lower extremity with ulcer of unspecified site: Secondary | ICD-10-CM | POA: Insufficient documentation

## 2015-12-19 DIAGNOSIS — L97909 Non-pressure chronic ulcer of unspecified part of unspecified lower leg with unspecified severity: Secondary | ICD-10-CM

## 2015-12-19 DIAGNOSIS — R938 Abnormal findings on diagnostic imaging of other specified body structures: Secondary | ICD-10-CM | POA: Diagnosis not present

## 2015-12-23 ENCOUNTER — Encounter: Payer: Self-pay | Admitting: Vascular Surgery

## 2015-12-24 ENCOUNTER — Ambulatory Visit (INDEPENDENT_AMBULATORY_CARE_PROVIDER_SITE_OTHER): Payer: Medicare Other | Admitting: Cardiology

## 2015-12-24 ENCOUNTER — Encounter: Payer: Self-pay | Admitting: Cardiology

## 2015-12-24 ENCOUNTER — Ambulatory Visit (INDEPENDENT_AMBULATORY_CARE_PROVIDER_SITE_OTHER): Payer: Medicare Other | Admitting: Vascular Surgery

## 2015-12-24 ENCOUNTER — Encounter: Payer: Self-pay | Admitting: Vascular Surgery

## 2015-12-24 VITALS — BP 162/72 | HR 79 | Ht 69.0 in | Wt 241.0 lb

## 2015-12-24 VITALS — BP 162/77 | HR 77 | Ht 69.0 in | Wt 243.6 lb

## 2015-12-24 DIAGNOSIS — I509 Heart failure, unspecified: Secondary | ICD-10-CM | POA: Diagnosis not present

## 2015-12-24 DIAGNOSIS — Z951 Presence of aortocoronary bypass graft: Secondary | ICD-10-CM

## 2015-12-24 DIAGNOSIS — I70232 Atherosclerosis of native arteries of right leg with ulceration of calf: Secondary | ICD-10-CM | POA: Diagnosis not present

## 2015-12-24 DIAGNOSIS — E669 Obesity, unspecified: Secondary | ICD-10-CM | POA: Insufficient documentation

## 2015-12-24 DIAGNOSIS — L98499 Non-pressure chronic ulcer of skin of other sites with unspecified severity: Secondary | ICD-10-CM

## 2015-12-24 DIAGNOSIS — R0602 Shortness of breath: Secondary | ICD-10-CM

## 2015-12-24 DIAGNOSIS — IMO0001 Reserved for inherently not codable concepts without codable children: Secondary | ICD-10-CM

## 2015-12-24 DIAGNOSIS — I83019 Varicose veins of right lower extremity with ulcer of unspecified site: Secondary | ICD-10-CM

## 2015-12-24 DIAGNOSIS — I480 Paroxysmal atrial fibrillation: Secondary | ICD-10-CM | POA: Diagnosis not present

## 2015-12-24 DIAGNOSIS — I998 Other disorder of circulatory system: Secondary | ICD-10-CM

## 2015-12-24 DIAGNOSIS — I83009 Varicose veins of unspecified lower extremity with ulcer of unspecified site: Secondary | ICD-10-CM | POA: Insufficient documentation

## 2015-12-24 DIAGNOSIS — J438 Other emphysema: Secondary | ICD-10-CM

## 2015-12-24 DIAGNOSIS — I872 Venous insufficiency (chronic) (peripheral): Secondary | ICD-10-CM | POA: Diagnosis not present

## 2015-12-24 DIAGNOSIS — I70209 Unspecified atherosclerosis of native arteries of extremities, unspecified extremity: Secondary | ICD-10-CM | POA: Insufficient documentation

## 2015-12-24 DIAGNOSIS — I70229 Atherosclerosis of native arteries of extremities with rest pain, unspecified extremity: Secondary | ICD-10-CM | POA: Insufficient documentation

## 2015-12-24 DIAGNOSIS — I739 Peripheral vascular disease, unspecified: Secondary | ICD-10-CM

## 2015-12-24 DIAGNOSIS — I10 Essential (primary) hypertension: Secondary | ICD-10-CM

## 2015-12-24 DIAGNOSIS — L97909 Non-pressure chronic ulcer of unspecified part of unspecified lower leg with unspecified severity: Secondary | ICD-10-CM

## 2015-12-24 MED ORDER — APIXABAN 5 MG PO TABS: 5.0000 mg | ORAL_TABLET | Freq: Two times a day (BID) | ORAL | Status: DC

## 2015-12-24 MED ORDER — FUROSEMIDE 40 MG PO TABS: 40.0000 mg | ORAL_TABLET | Freq: Every day | ORAL | Status: DC

## 2015-12-24 NOTE — Assessment & Plan Note (Signed)
Holding NSR, CHADs VASc=5 He is not taking Eliquis- couldn't afford it, no samples available

## 2015-12-24 NOTE — Assessment & Plan Note (Signed)
Controlled.  

## 2015-12-24 NOTE — Patient Instructions (Addendum)
Your physician recommends that you schedule a follow-up appointment in: 1 Week   Your physician has recommended you make the following change in your medication:   TAKE LASIX 40 MG TWO TIMES DAILY FOR THE NEXT TWO DAYS THEN START LASIX 81 MG DAILY  Your physician recommends that you return for lab work in: [place] ([place])  Your physician has requested that you have an echocardiogram. Echocardiography is a painless test that uses sound waves to create images of your heart. It provides your doctor with information about the size and shape of your heart and how well your heart's chambers and valves are working. This procedure takes approximately one hour. There are no restrictions for this procedure.   Your physician has recommended that you have a sleep study. This test records several body functions during sleep, including: brain activity, eye movement, oxygen and carbon dioxide blood levels, heart rate and rhythm, breathing rate and rhythm, the flow of air through your mouth and nose, snoring, body muscle movements, and chest and belly movement.  If you need a refill on your cardiac medications before your next appointment, please call your pharmacy.  Thank you for choosing [facility]!

## 2015-12-24 NOTE — Progress Notes (Signed)
Referred by:  Sinda Du, MD [address] [place], [place] [address]  Reason for referral: right shin ulcer  History of Present Illness  John Sampson is a 80 y.o. (22-Sep-1936) male who presents with chief complaint: ulcers in R leg.  Onset of symptom occurred one month ago, pt noted onset of swelling which lead to skin break down.  Patient notes minimal pain with his wound, only mild with dressing changes.  This patient's ambulation is severely limited by his COPD so he does not get intermittent claudication.  He denies any rest pain.  The patient thinks his swelling has progressed and breathing is worse than usual.   Past Medical History  Diagnosis Date  . COPD (chronic obstructive pulmonary disease) ([place]) 3.12.14    2D Echo, EF 50-55%  . GERD (gastroesophageal reflux disease)   . Headache(784.0)   . Coronary atherosclerosis of native coronary artery     Multivessel status post CABG  . Myocardial infarction ([place])   . PAD (peripheral artery disease) ([facility])     Moderate bilateral SFA disease at angiography 01/2013  . Gastric ulcer     Small - nonbleeding  . Iron deficiency anemia     Negative Givens capsule study   . Pituitary macroadenoma ([place])   . Atrial flutter [facility])     Past Surgical History  Procedure Laterality Date  . Coronary artery bypass graft  2003    5 grafts - details not clear  . Eye surgery  2012  . Tonsillectomy  Age 57  . Craniotomy  12/31/2011    Procedure: CRANIOTOMY HYPOPHYSECTOMY TRANSNASAL APPROACH;  Surgeon: [name], MD;  Location: [facility] NEURO ORS;  Service: Neurosurgery;  Laterality: N/A;  Transphenoidal Hypophysectomy With Fat Graft Harvest from right abdomen   . Colonoscopy  08/18/2012    Procedure: COLONOSCOPY;  Surgeon: [name], MD;  [facility];  Service: Endoscopy;  Laterality: N/A;  1:25  . Esophagogastroduodenoscopy  08/18/2012    Procedure: ESOPHAGOGASTRODUODENOSCOPY (EGD);  Surgeon: [name], MD;   [facility];  Service: Endoscopy;  Laterality: N/A;  . Givens capsule study N/A 01/25/2013    Procedure: GIVENS CAPSULE STUDY;  Surgeon: [name], MD;  [facility];  Service: Endoscopy;  Laterality: N/A;  730  . Lower extremity angiogram N/A 02/19/2013    Procedure: LOWER EXTREMITY ANGIOGRAM;  Surgeon: [name], MD;  Location: [facility] CATH LAB;  Service: Cardiovascular;  Laterality: N/A;  . Abdominal aortagram N/A 02/19/2013    Procedure: ABDOMINAL AORTAGRAM;  Surgeon: [name], MD;  Location: [facility] CATH LAB;  Service: Cardiovascular;  Laterality: N/A;  . Stress myocardial perfusion  12/08/2011    Indications: Abnormal EKG, Eval of prior GABG  . Pv angiogram  02/19/13    Indications: slow healing left calf ulcer  . Tee without cardioversion N/A 06/16/2015    Procedure: TRANSESOPHAGEAL ECHOCARDIOGRAM (TEE);  Surgeon: Satira Sark, MD;  Location: [facility] ORS;  Service: Cardiovascular;  Laterality: N/A;  . Cardioversion N/A 06/16/2015    Procedure: CARDIOVERSION;  Surgeon: Satira Sark, MD;  Location: [facility] ORS;  Service: Cardiovascular;  Laterality: N/A;    Social History   Social History  . Marital Status: Widowed    Spouse Name: N/A  . Number of Children: N/A  . Years of Education: N/A   Occupational History  . Not on file.   Social History Main Topics  . Smoking status: Former  Smoker -- 3.00 packs/day for 45 years    Types: Cigarettes    Start date: 01/24/1955    Quit date: 01/23/2002  . Smokeless tobacco: Never Used     Comment: Quit 11 yrs ago  . Alcohol Use: No  . Drug Use: No  . Sexual Activity: No   Other Topics Concern  . Not on file   Social History Narrative    Family History  Problem Relation Age of Onset  . CAD Mother   . Heart disease Father     before age 64    Current Outpatient Prescriptions  Medication Sig Dispense Refill  . acetaminophen (TYLENOL) 325 MG tablet Take 650 mg by mouth daily as needed for headache.      . albuterol (PROVENTIL HFA) 108 (90 BASE) MCG/ACT inhaler Inhale 2 puffs into the lungs every 6 (six) hours as needed for wheezing or shortness of breath. 1 Inhaler 3  . diltiazem (CARDIZEM CD) 240 MG 24 hr capsule Take 1 capsule (240 mg total) by mouth daily. 90 capsule 3  . ferrous sulfate 325 (65 FE) MG tablet Take 1 tablet (325 mg total) by mouth 2 (two) times daily with a meal.    . fish oil-omega-3 fatty acids 1000 MG capsule Take 2 g by mouth daily.    . Fluticasone Furoate-Vilanterol (BREO ELLIPTA IN) Inhale 1 puff into the lungs daily.     . folic acid (FOLVITE) 1 MG tablet Take 1 mg by mouth daily.     . furosemide (LASIX) 40 MG tablet Take 1 tablet (40 mg total) by mouth daily as needed for fluid. 30 tablet 6  . lisinopril (PRINIVIL,ZESTRIL) 10 MG tablet Take 1 tablet (10 mg total) by mouth daily. 90 tablet 3  . metoprolol succinate (TOPROL-XL) 50 MG 24 hr tablet Take 1 tablet (50 mg total) by mouth daily. 30 tablet 11  . nitroGLYCERIN (NITROSTAT) 0.4 MG SL tablet Place 1 tablet (0.4 mg total) under the tongue every 5 (five) minutes as needed for chest pain. 30 tablet 9  . omeprazole (PRILOSEC) 20 MG capsule Take 20 mg by mouth 2 (two) times daily before a meal.    . pantoprazole (PROTONIX) 40 MG tablet Take 40 mg by mouth 2 (two) times daily.     . potassium chloride (MICRO-K) 10 MEQ CR capsule Take 10 mEq by mouth daily.     . simvastatin (ZOCOR) 40 MG tablet Take 1 tablet (40 mg total) by mouth daily. 30 tablet 6  . traMADol (ULTRAM) 50 MG tablet Take 1 tablet (50 mg total) by mouth every 6 (six) hours as needed. 20 tablet 0   No current facility-administered medications for this visit.    No Known Allergies   REVIEW OF SYSTEMS:  (Positives checked otherwise negative)  CARDIOVASCULAR:   [ ]  chest pain,  [ ]  chest pressure,  [ ]  palpitations,  [ ]  shortness of breath when laying flat,  [x]  shortness of breath with exertion,   [x]  pain in feet when walking,  [x]  pain in  feet when laying flat, [ ]  history of blood clot in veins (DVT),  [ ]  history of phlebitis,  [x]  swelling in legs,  [ ]  varicose veins  PULMONARY:   [ ]  productive cough,  [ ]  asthma,  [ ]  wheezing  NEUROLOGIC:   [ ]  weakness in arms or legs,  [ ]  numbness in arms or legs,  [ ]  difficulty speaking or slurred speech,  [ ]  temporary  loss of vision in one eye,  [ ]  dizziness  HEMATOLOGIC:   [ ]  bleeding problems,  [ ]  problems with blood clotting too easily  MUSCULOSKEL:   [ ]  joint pain, [ ]  joint swelling  GASTROINTEST:   [ ]  vomiting blood,  [ ]  blood in stool     GENITOURINARY:   [ ]  burning with urination,  [ ]  blood in urine  PSYCHIATRIC:   [ ]  history of major depression  INTEGUMENTARY:   [x]  rashes,  [ ]  ulcers  CONSTITUTIONAL:   [ ]  fever,  [ ]  chills   For VQI Use Only  PRE-ADM LIVING: Home  AMB STATUS: Ambulatory  CAD Sx: History of MI, but no symptoms No MI within 6 months  PRIOR CHF: None  STRESS TEST: [x]  No, [ ]  Normal, [ ]  + ischemia, [ ]  + MI, [ ]  Both   Physical Examination  Filed Vitals:   12/24/15 0824 12/24/15 0827  BP: 176/72 162/77  Pulse: 77   Height: 5\' 9"  (1.753 m)   Weight: 243 lb 9.6 oz (110.496 kg)   SpO2: 94%    Body mass index is 35.96 kg/(m^2).  General: A&O x 3, WD, Obese, breathing hard, ill appearing  Head: [place]/AT  Ear/Nose/Throat: Hearing grossly intact, nares w/o erythema or drainage, oropharynx w/o Erythema/Exudate, Mallampati score: 3  Eyes: PERRLA, EOMI, post surgical lense changes  Neck: Supple, no nuchal rigidity, no palpable LAD  Pulmonary: tachypneic, RR 30, Sym exp, good air movt in upper fields, BLL rales (R>>L), no rhonchi, & wheezing  Cardiac: RRR, Nl S1, S2, no Murmurs, rubs or gallops  Vascular: Vessel Right Left  Radial Palpable Palpable  Brachial Palpable Palpable  Carotid Palpable, without bruit Palpable, without bruit  Aorta Not palpable N/A  Femoral Palpable Palpable    Popliteal Not palpable Not palpable  PT Not Palpable Not Palpable  DP Not Palpable Not Palpable   Gastrointestinal: soft, NTND, -G/R, - HSM, - masses, - CVAT B, large pannus, no palpable AAA  Musculoskeletal: M/S 5/5 throughout grossly, Extremities without ischemic changes except venous appearing ulcer in R anterior shin, B LDS (L>R), edema 3+ BLE: up to entire leg on R, up to mid thigh on L  Neurologic: CN 2-12 intact , Pain and light touch intact in extremities , Motor exam as listed above  Psychiatric: Judgment intact, Mood & affect appropriate for pt's clinical situation  Dermatologic: See M/S exam for extremity exam, no rashes otherwise noted  Lymph : No Cervical, Axillary, or Inguinal lymphadenopathy    Non-Invasive Vascular Imaging  ABI (Date: 12/19/15)  R: 0.60, DP: mono, PT: mono, TBI: 0.38  L: 0.85, DP: mono, PT: bi, TBI: 0.44  BLE arterial duplex (12/19/15)  R: Biphasic CFA, rest of leg monophasic, peroneal not visualized  L: Biphasic CFA, SFA, rest of leg monophasic, peroneal not visualized   Outside Studies/Documentation 4 pages of outside documents were reviewed including: outpatient PCP chart and outside ABI.   Medical Decision Making  John Sampson is a 80 y.o. male who presents with: RLE critical limb ischemia likely combine PAD and CVI (C6), Venous stasis ulcers RLE, acute CHF   The patient's clinical presentation is concerning for acute CHF given significant leg swelling along with rales (L>>R).  I have arrange for him to see his cardiologist today for evaluation. I normally would recommend angiography in this patient case, but I doubt he could tolerate being supine at this point.   Will refer him  to Wound Care for compressive therapy and wound care to manage the right leg.   The patient will follow up in 2 weeks for venous reflux studies in both legs. If he is medically stable in 2 weeks, we will discuss further proceeding with an angiogram. Based on  my initial evaluation, I doubt this patient is a candidate for open surgical revascularization.  I discussed in depth with the patient the nature of atherosclerosis, and emphasized the importance of maximal medical management including strict control of blood pressure, blood glucose, and lipid levels, antiplatelet agents, obtaining regular exercise, and cessation of smoking.  The patient is aware that without maximal medical management the underlying atherosclerotic disease process will progress, limiting the benefit of any interventions. The patient is currently on a statin:  Zocor. The patient is currently not on an anti-platelet.  The patient will be started on ASA 81 mg PO daily.  Thank you for allowing [place] to participate in this patient's care.   Adele Barthel, MD [facility] Office: [phone] Pager: [phone]  12/24/2015, 8:56 AM

## 2015-12-24 NOTE — Progress Notes (Signed)
12/24/2015 John Sampson   1936-06-30  [id number]  Primary Physician HAWKINS,EDWARD Carlean Jews, MD Primary Cardiologist: Dr Domenic Polite  HPI:  80 y/o obese Caucasian male with a history CAD- s/p CABG x 5 in 2003, Myoview low risk in 2013. He has PVD and had PVA in 2014 by Dr Gwenlyn Found showing moderate PVD. He had PAF this past summer and was placed on Eliquis and had TEE CV 06/16/15. He says he has run out of samples and was told at the office that they had no more samples to give out. He is taking ASA alone now.            He was sent to the office in [place] today by Dr Bridgett Larsson. The pt presented to him for LE ulcers. ABI's were significantly depressed and Dr Bridgett Larsson feels he most likely has critical LE ischemia. He also had significant LE edema and rales on exam. He was sent here for evaluation and treatment of CHF as Dr Bridgett Larsson does not believe the pt will be able to lay flat for an angiogram. The pt tells me he is no more SOB than usual but admits he is not sleeping well and SOB at night.    Current Outpatient Prescriptions  Medication Sig Dispense Refill  . acetaminophen (TYLENOL) 325 MG tablet Take 650 mg by mouth daily as needed for headache.    . albuterol (PROVENTIL HFA) 108 (90 BASE) MCG/ACT inhaler Inhale 2 puffs into the lungs every 6 (six) hours as needed for wheezing or shortness of breath. 1 Inhaler 3  . diltiazem (CARDIZEM CD) 240 MG 24 hr capsule Take 1 capsule (240 mg total) by mouth daily. 90 capsule 3  . ferrous sulfate 325 (65 FE) MG tablet Take 1 tablet (325 mg total) by mouth 2 (two) times daily with a meal.    . fish oil-omega-3 fatty acids 1000 MG capsule Take 2 g by mouth daily.    . Fluticasone Furoate-Vilanterol (BREO ELLIPTA IN) Inhale 1 puff into the lungs daily.     . folic acid (FOLVITE) 1 MG tablet Take 1 mg by mouth daily.     . furosemide (LASIX) 40 MG tablet Take 1 tablet (40 mg total) by mouth daily as needed for fluid. 30 tablet 6  . lisinopril (PRINIVIL,ZESTRIL) 10 MG tablet  Take 1 tablet (10 mg total) by mouth daily. 90 tablet 3  . metoprolol succinate (TOPROL-XL) 50 MG 24 hr tablet Take 1 tablet (50 mg total) by mouth daily. 30 tablet 11  . nitroGLYCERIN (NITROSTAT) 0.4 MG SL tablet Place 1 tablet (0.4 mg total) under the tongue every 5 (five) minutes as needed for chest pain. 30 tablet 9  . omeprazole (PRILOSEC) 20 MG capsule Take 20 mg by mouth 2 (two) times daily before a meal.    . pantoprazole (PROTONIX) 40 MG tablet Take 40 mg by mouth 2 (two) times daily.     . potassium chloride (MICRO-K) 10 MEQ CR capsule Take 10 mEq by mouth daily.     . simvastatin (ZOCOR) 40 MG tablet Take 1 tablet (40 mg total) by mouth daily. 30 tablet 6  . traMADol (ULTRAM) 50 MG tablet Take 1 tablet (50 mg total) by mouth every 6 (six) hours as needed. 20 tablet 0   No current facility-administered medications for this visit.    No Known Allergies  Social History   Social History  . Marital Status: Widowed    Spouse Name: N/A  . Number of  Children: N/A  . Years of Education: N/A   Occupational History  . Not on file.   Social History Main Topics  . Smoking status: Former Smoker -- 3.00 packs/day for 45 years    Types: Cigarettes    Start date: 01/24/1955    Quit date: 01/23/2002  . Smokeless tobacco: Never Used     Comment: Quit 11 yrs ago  . Alcohol Use: No  . Drug Use: No  . Sexual Activity: No   Other Topics Concern  . Not on file   Social History Narrative     Review of Systems: General: negative for chills, fever, night sweats or weight changes.  Cardiovascular: negative for chest pain, dyspnea on exertion, edema, orthopnea, palpitations, paroxysmal nocturnal dyspnea or shortness of breath Dermatological: negative for rash Respiratory: negative for cough or wheezing Urologic: negative for hematuria Abdominal: negative for nausea, vomiting, diarrhea, bright red blood per rectum, melena, or hematemesis Neurologic: negative for visual changes, syncope,  or dizziness All other systems reviewed and are otherwise negative except as noted above.    Blood pressure 162/72, pulse 79, height 5\' 9"  (1.753 m), weight 241 lb (109.317 kg), SpO2 92 %.  General appearance: alert, cooperative, no distress and morbidly obese Lungs: rales 1/3 up on Lt and at Rt base Heart: regular rate and rhythm and 2/6 systolic murmur AOV, LSB Extremities: 2-3+ chronic edema with skin ulcers on RT Skin: pale, dry Neurologic: Grossly normal  EKG NSR, TWI 1,F,V2- old  ASSESSMENT AND PLAN:   CHF (congestive heart failure) (HCC) Pt has rales on exam and LE edema, no more SOB than usual.  COPD (chronic obstructive pulmonary disease) Hx of COPD and obesity  Essential hypertension, benign Controlled  Critical lower limb ischemia Referred from Dr Lianne Moris office for treatment of CHF, pt with RLE ischemia (ABI 0.6)  Chronic venous insufficiency Bilateral LE venous insufficiency with venous ulcers  PAF (paroxysmal atrial fibrillation) (HCC) Holding NSR, CHADs VASc=5 He is not taking Eliquis- couldn't afford it, no samples available  PAD (peripheral artery disease) Know LE PVD by prior PVA 2014   PLAN  He has only been taking Lasix 40 mg as needed- he doesn't like to take it-"I'm in the bathroom all the time". I suggested he take Lasix 40 mg BID x 2 days, then start Lasix 40 mg daily. He will return next week for an echo and BMP in the am- OV with APP in the pm as he doesn't have transportation. I also supplied him with Eliquis samples and arranged for him to pursue medication assistance. He should have a sleep study at some point as well.   Shizue Kaseman K PA-C 12/24/2015 3:00 PM

## 2015-12-24 NOTE — Assessment & Plan Note (Addendum)
Referred from Dr Lianne Moris office for treatment of CHF, pt with RLE ischemia (ABI 0.6)

## 2015-12-24 NOTE — Assessment & Plan Note (Signed)
Bilateral LE venous insufficiency with venous ulcers

## 2015-12-24 NOTE — Assessment & Plan Note (Signed)
Hx of COPD and obesity

## 2015-12-24 NOTE — Assessment & Plan Note (Signed)
Pt has rales on exam and LE edema, no more SOB than usual.

## 2015-12-24 NOTE — Addendum Note (Signed)
Addended by: Levonne Hubert on: 12/24/2015 03:38 PM   Modules accepted: Orders

## 2015-12-24 NOTE — Assessment & Plan Note (Signed)
Know LE PVD by prior PVA 2014

## 2015-12-25 ENCOUNTER — Ambulatory Visit (HOSPITAL_COMMUNITY): Payer: Medicare Other | Attending: Vascular Surgery | Admitting: Physical Therapy

## 2015-12-25 DIAGNOSIS — I83019 Varicose veins of right lower extremity with ulcer of unspecified site: Secondary | ICD-10-CM | POA: Diagnosis not present

## 2015-12-25 DIAGNOSIS — L97919 Non-pressure chronic ulcer of unspecified part of right lower leg with unspecified severity: Secondary | ICD-10-CM

## 2015-12-25 DIAGNOSIS — M79604 Pain in right leg: Secondary | ICD-10-CM | POA: Diagnosis not present

## 2015-12-25 NOTE — Therapy (Signed)
[place], [place], [address] Phone: [phone]   Fax:  [phone]  Wound Care Evaluation  Patient Details  Name: John Sampson MRN: [id number] Date of Birth: 1936-04-08 No Data Recorded  Encounter Date: 12/25/2015      PT End of Session - 12/25/15 1545    Visit Number 1   Number of Visits 12   Date for PT Re-Evaluation 01/22/16   Authorization Type Medicare    Authorization - Visit Number 1   Authorization - Number of Visits 10   PT Start Time [id number]   PT Stop Time 1516   PT Time Calculation (min) 44 min   Activity Tolerance Patient tolerated treatment well   Behavior During Therapy [facility] for tasks assessed/performed      Past Medical History  Diagnosis Date  . COPD (chronic obstructive pulmonary disease) ([place]) 3.12.14    2D Echo, EF 50-55%  . GERD (gastroesophageal reflux disease)   . Headache(784.0)   . Coronary atherosclerosis of native coronary artery     Multivessel status post CABG  . Myocardial infarction ([place])   . PAD (peripheral artery disease) (HCC)     Moderate bilateral SFA disease at angiography 01/2013  . Gastric ulcer     Small - nonbleeding  . Iron deficiency anemia     Negative Givens capsule study   . Pituitary macroadenoma ([place])   . Atrial flutter [facility])     Past Surgical History  Procedure Laterality Date  . Coronary artery bypass graft  2003    5 grafts - details not clear  . Eye surgery  2012  . Tonsillectomy  Age 56  . Craniotomy  12/31/2011    Procedure: CRANIOTOMY HYPOPHYSECTOMY TRANSNASAL APPROACH;  Surgeon: [name], MD;  Location: [facility] NEURO ORS;  Service: Neurosurgery;  Laterality: N/A;  Transphenoidal Hypophysectomy With Fat Graft Harvest from right abdomen   . Colonoscopy  08/18/2012    Procedure: COLONOSCOPY;  Surgeon: [name], MD;  [facility];  Service: Endoscopy;  Laterality: N/A;  1:25  . Esophagogastroduodenoscopy  08/18/2012    Procedure:  ESOPHAGOGASTRODUODENOSCOPY (EGD);  Surgeon: [name], MD;  [facility];  Service: Endoscopy;  Laterality: N/A;  . Givens capsule study N/A 01/25/2013    Procedure: GIVENS CAPSULE STUDY;  Surgeon: [name], MD;  [facility];  Service: Endoscopy;  Laterality: N/A;  730  . Lower extremity angiogram N/A 02/19/2013    Procedure: LOWER EXTREMITY ANGIOGRAM;  Surgeon: [name], MD;  Location: [facility] CATH LAB;  Service: Cardiovascular;  Laterality: N/A;  . Abdominal aortagram N/A 02/19/2013    Procedure: ABDOMINAL AORTAGRAM;  Surgeon: [name], MD;  Location: [facility] CATH LAB;  Service: Cardiovascular;  Laterality: N/A;  . Stress myocardial perfusion  12/08/2011    Indications: Abnormal EKG, Eval of prior GABG  . Pv angiogram  02/19/13    Indications: slow healing left calf ulcer  . Tee without cardioversion N/A 06/16/2015    Procedure: TRANSESOPHAGEAL ECHOCARDIOGRAM (TEE);  Surgeon: Satira Sark, MD;  Location: [facility] ORS;  Service: Cardiovascular;  Laterality: N/A;  . Cardioversion N/A 06/16/2015    Procedure: CARDIOVERSION;  Surgeon: Satira Sark, MD;  Location: [facility] ORS;  Service: Cardiovascular;  Laterality: N/A;    There were no vitals filed for this visit.  Visit Diagnosis:  Venous ulcer of right leg ([place]) - Plan: PT plan of care cert/re-cert  Right leg  pain - Plan: PT plan of care cert/re-cert      Subjective Assessment - 12/25/15 1529    Currently in Pain? No/denies           Wound Therapy - 12/25/15 1532    Wound Properties Date First Assessed: 12/25/15 Wound Type: Other (Comment) , venous ulcer   Location: Leg Location Orientation: Right;Lower Wound Description (Comments): R medial wound  Present on Admission: Yes   Dressing Type None   Dressing Changed New   Dressing Status Clean   Dressing Change Frequency Daily   % Wound base Red or Granulating 0%   % Wound base Yellow 100%   % Wound base Black 0%   Wound Length (cm) 1.8 cm    Wound Width (cm) 1.25 cm   Wound Depth (cm) 0 cm   Margins Unattached edges (unapproximated)   Closure None   Drainage Amount None   Drainage Description No odor   Treatment Cleansed;Other (Comment)  medihoney, gauze    Wound Properties Date First Assessed: 12/25/15 Wound Type: Other (Comment) , venous ulcer   Location: Leg Location Orientation: Right;Lower , anterior   Present on Admission: Yes   Dressing Type None   Dressing Changed New   Dressing Status Clean   Dressing Change Frequency Daily   % Wound base Red or Granulating 100%   % Wound base Yellow 0%   % Wound base Black 0%   Peri-wound Assessment Other (Comment)  irregular wound borders    Wound Length (cm) 2.25 cm   Wound Width (cm) 2.25 cm   Wound Depth (cm) 0 cm   Margins Unattached edges (unapproximated)   Closure None   Drainage Amount None   Drainage Description No odor   Treatment Cleansed;Other (Comment)  xeroform    Wound Properties Date First Assessed: 12/25/15 Wound Type: Other (Comment) , venous ulcer   Location: Leg , R lateral lower leg   Location Orientation: Right;Lower , lateral   Present on Admission: Yes   Dressing Type None   Dressing Changed New   Dressing Status Clean   Dressing Change Frequency Daily   % Wound base Red or Granulating 0%   % Wound base Yellow 100%   % Wound base Black 0%   % Wound base Other (Comment) 0%   Wound Length (cm) 0.7 cm   Wound Width (cm) 0.5 cm   Margins Unattached edges (unapproximated)   Closure None   Drainage Amount None   Drainage Description No odor   Treatment Cleansed;Other (Comment)  medihoney, gauze    Wound Therapy - Clinical Statement Patietn presents with several wounds on his R lower leg which MD has diagnosed as venous ulcers. Patient reports he has been using silver that MD gave him without a major difference to main wound. Attempted to debride today however slough very adherent to wounds today; as patient had been using silver with wounds already  and reported they are not getting better, trialed medihoney today to see if this will make a difference in wound healing. Wrapped leg with profore lite today, instructed patient to cut off wrapping if he has toe pain or color change, or begins to have increased cardiac symptoms after application of wrap. Recommend skilled PT services 2x/week for 6 weeks to address non-healing wounds.    Wound Therapy - Functional Problem List non-healing wounds    Factors Delaying/Impairing Wound Healing Other (comment)  significant cardiopulmonary medical history    Wound Therapy - Frequency Other (comment)  2x/week   Wound Therapy - Current Recommendations PT   Wound Plan debridement and use of appropriate wound dressing; profore lite as tolerated    Decrease Necrotic Tissue to 10%   Decrease Necrotic Tissue - Progress Goal set today   Increase Granulation Tissue to 90%   Increase Granulation Tissue - Progress Goal set today   Decrease Length/Width/Depth by (cm) 1.25cm    Decrease Length/Width/Depth - Progress Goal set today   Patient/Family will be able to  recognized signs/symptoms of infection in wounds    Patient/Family Instruction Goal - Progress Goal set today   Additional Wound Therapy Goal Full wound healing    Additional Wound Therapy Goal - Progress Goal set today   Time For Goal Achievement Other (comment)  6 weeks   Wound Therapy - Potential for Goals Good                         PT Education - 01/04/2016 1545    Education provided Yes   Education Details cut off wraps if experience toe pain, toe color change, increased cardiorespiratory symptoms    Person(s) Educated Patient   Methods Explanation   Comprehension Verbalized understanding                     G-Codes - 04-Jan-2016 1550    Functional Assessment Tool Used Based on wound healing status, presence of slough    Functional Limitation Mobility: Walking and moving around   Mobility: Walking and Moving  Around Current Status [phone]) At least 80 percent but less than 100 percent impaired, limited or restricted   Mobility: Walking and Moving Around Goal Status [phone]) At least 60 percent but less than 80 percent impaired, limited or restricted      Problem List Patient Active Problem List   Diagnosis Date Noted  . Venous stasis ulcers ([place]) 12/24/2015  . Chronic venous insufficiency 12/24/2015  . Atherosclerosis of extremity with ulceration ([place]) 12/24/2015  . CHF (congestive heart failure) ([place]) 12/24/2015  . Critical lower limb ischemia 12/24/2015  . Obesity 12/24/2015  . PAF (paroxysmal atrial fibrillation) ([place]) 12/24/2015  . PAD (peripheral artery disease) ([place]) 06/07/2014  . COPD (chronic obstructive pulmonary disease) ([place]) 01/23/2013  . Essential hypertension, benign 08/03/2012  . Mixed hyperlipidemia 08/03/2012  . Hx of CABG 08/03/2012    Deniece Ree PT, DPT [facility] [address] [place], [place], [address] Phone: [phone]   Fax:  [phone]  Name: John Sampson MRN: [id number] Date of Birth: 1936/03/17

## 2015-12-29 ENCOUNTER — Ambulatory Visit (HOSPITAL_COMMUNITY): Payer: Medicare Other | Admitting: Physical Therapy

## 2015-12-29 DIAGNOSIS — M79604 Pain in right leg: Secondary | ICD-10-CM

## 2015-12-29 DIAGNOSIS — I83019 Varicose veins of right lower extremity with ulcer of unspecified site: Secondary | ICD-10-CM | POA: Diagnosis not present

## 2015-12-29 DIAGNOSIS — L97919 Non-pressure chronic ulcer of unspecified part of right lower leg with unspecified severity: Principal | ICD-10-CM

## 2015-12-29 NOTE — Therapy (Signed)
[place], [place], [address] Phone: [phone]   Fax:  [phone]  Wound Care Therapy  Patient Details  Name: John Sampson MRN: [id number] Date of Birth: 01-21-1936 No Data Recorded  Encounter Date: 12/29/2015      PT End of Session - 12/29/15 1638    Visit Number 2   Number of Visits 12   Date for PT Re-Evaluation 01/22/16   Authorization Type Medicare    Authorization - Visit Number 2   Authorization - Number of Visits 10   PT Start Time 1150   PT Stop Time 1230   PT Time Calculation (min) 40 min   Activity Tolerance Patient tolerated treatment well   Behavior During Therapy [facility] for tasks assessed/performed      Past Medical History  Diagnosis Date  . COPD (chronic obstructive pulmonary disease) ([place]) 3.12.14    2D Echo, EF 50-55%  . GERD (gastroesophageal reflux disease)   . Headache(784.0)   . Coronary atherosclerosis of native coronary artery     Multivessel status post CABG  . Myocardial infarction ([place])   . PAD (peripheral artery disease) (HCC)     Moderate bilateral SFA disease at angiography 01/2013  . Gastric ulcer     Small - nonbleeding  . Iron deficiency anemia     Negative Givens capsule study   . Pituitary macroadenoma ([place])   . Atrial flutter [facility])     Past Surgical History  Procedure Laterality Date  . Coronary artery bypass graft  2003    5 grafts - details not clear  . Eye surgery  2012  . Tonsillectomy  Age 20  . Craniotomy  12/31/2011    Procedure: CRANIOTOMY HYPOPHYSECTOMY TRANSNASAL APPROACH;  Surgeon: [name], MD;  Location: [facility] NEURO ORS;  Service: Neurosurgery;  Laterality: N/A;  Transphenoidal Hypophysectomy With Fat Graft Harvest from right abdomen   . Colonoscopy  08/18/2012    Procedure: COLONOSCOPY;  Surgeon: [name], MD;  [facility];  Service: Endoscopy;  Laterality: N/A;  1:25  . Esophagogastroduodenoscopy  08/18/2012    Procedure:  ESOPHAGOGASTRODUODENOSCOPY (EGD);  Surgeon: [name], MD;  [facility];  Service: Endoscopy;  Laterality: N/A;  . Givens capsule study N/A 01/25/2013    Procedure: GIVENS CAPSULE STUDY;  Surgeon: [name], MD;  [facility];  Service: Endoscopy;  Laterality: N/A;  730  . Lower extremity angiogram N/A 02/19/2013    Procedure: LOWER EXTREMITY ANGIOGRAM;  Surgeon: [name], MD;  Location: [facility] CATH LAB;  Service: Cardiovascular;  Laterality: N/A;  . Abdominal aortagram N/A 02/19/2013    Procedure: ABDOMINAL AORTAGRAM;  Surgeon: [name], MD;  Location: [facility] CATH LAB;  Service: Cardiovascular;  Laterality: N/A;  . Stress myocardial perfusion  12/08/2011    Indications: Abnormal EKG, Eval of prior GABG  . Pv angiogram  02/19/13    Indications: slow healing left calf ulcer  . Tee without cardioversion N/A 06/16/2015    Procedure: TRANSESOPHAGEAL ECHOCARDIOGRAM (TEE);  Surgeon: Satira Sark, MD;  Location: [facility] ORS;  Service: Cardiovascular;  Laterality: N/A;  . Cardioversion N/A 06/16/2015    Procedure: CARDIOVERSION;  Surgeon: Satira Sark, MD;  Location: [facility] ORS;  Service: Cardiovascular;  Laterality: N/A;    There were no vitals filed for this visit.  Visit Diagnosis:  Venous ulcer of right leg (HCC)  Right leg pain  Wound Therapy - 12/29/15 1628    Subjective Pt states his bandages began to come down.  STates he lost 14 pounds of fluid in a week.    Patient and Family Stated Goals wounds to heal    Pain Assessment No/denies pain   Wound Properties Date First Assessed: 12/25/15 Wound Type: Other (Comment) , venous ulcer   Location: Leg Location Orientation: Right;Lower Wound Description (Comments): R medial wound  Present on Admission: Yes   Dressing Type Gauze (Comment)  medihoney gel   Dressing Changed Changed   Dressing Status Clean   Dressing Change Frequency Daily   % Wound base Red or Granulating 0%   %  Wound base Yellow 95%   % Wound base Black 5%   Margins Unattached edges (unapproximated)   Closure None   Drainage Amount None   Drainage Description No odor   Treatment Cleansed;Debridement ([place])   Wound Properties Date First Assessed: 12/25/15 Wound Type: Other (Comment) , venous ulcer   Location: Leg Location Orientation: Right;Lower , anterior   Present on Admission: Yes   Dressing Type Impregnated gauze (bismuth)   Dressing Changed Changed   Dressing Status Clean   Dressing Change Frequency Daily   % Wound base Red or Granulating 100%   % Wound base Yellow 0%   % Wound base Black 0%   Peri-wound Assessment Other (Comment)  irregular wound borders    Margins Unattached edges (unapproximated)   Closure None   Drainage Amount None   Drainage Description No odor   Treatment Cleansed;Debridement (Selective)   Wound Properties Date First Assessed: 12/25/15 Wound Type: Other (Comment) , venous ulcer   Location: Leg , R lateral lower leg   Location Orientation: Right;Lower , lateral   Present on Admission: Yes   Dressing Type Impregnated gauze (bismuth)   Dressing Changed Changed   Dressing Status Clean   Dressing Change Frequency Daily   % Wound base Red or Granulating 50%   % Wound base Yellow 50%   % Wound base Black 0%   % Wound base Other (Comment) 0%   Margins Unattached edges (unapproximated)   Closure None   Drainage Amount None   Drainage Description No odor   Treatment Cleansed;Debridement (Selective)   Wound Therapy - Clinical Statement Pt with continued redness perimeter of wounds but without noted infection.  Able to remove slough easily from all wounds except the most medial anterior one that remains 95% covered.  Changed dressings to medihoney gel for this one and continued with xeroform to all others.  Cleansed perimeter of wounds well with soapy water and moisturized prior to rebandaging.  Cleansed wound beds with keracleanse and used profore lite compression  system.    Wound Therapy - Functional Problem List non-healing wounds    Factors Delaying/Impairing Wound Healing Other (comment)  significant cardiopulmonary medical history    Wound Therapy - Frequency Other (comment)  2x/week   Wound Therapy - Current Recommendations PT   Wound Plan debridement and use of appropriate wound dressing; profore lite as tolerated    Decrease Necrotic Tissue to 10%   Increase Granulation Tissue to 90%   Decrease Length/Width/Depth by (cm) 1.25cm    Patient/Family will be able to  recognized signs/symptoms of infection in wounds    Additional Wound Therapy Goal Full wound healing    Time For Goal Achievement Other (comment)  6 weeks   Wound Therapy - Potential for Goals Good  Problem List Patient Active Problem List   Diagnosis Date Noted  . Venous stasis ulcers ([place]) 12/24/2015  . Chronic venous insufficiency 12/24/2015  . Atherosclerosis of extremity with ulceration ([place]) 12/24/2015  . CHF (congestive heart failure) ([place]) 12/24/2015  . Critical lower limb ischemia 12/24/2015  . Obesity 12/24/2015  . PAF (paroxysmal atrial fibrillation) ([place]) 12/24/2015  . PAD (peripheral artery disease) ([place]) 06/07/2014  . COPD (chronic obstructive pulmonary disease) ([place]) 01/23/2013  . Essential hypertension, benign 08/03/2012  . Mixed hyperlipidemia 08/03/2012  . Hx of CABG 08/03/2012    Teena Irani, PTA/CLT [phone]  12/29/2015, 4:40 PM  [facility] [address] [place], [place], [address] Phone: [phone]   Fax:  [phone]  Name: John Sampson MRN: [id number] Date of Birth: 01/22/1936

## 2015-12-31 ENCOUNTER — Ambulatory Visit (HOSPITAL_COMMUNITY): Payer: Medicare Other | Attending: Vascular Surgery | Admitting: Physical Therapy

## 2015-12-31 DIAGNOSIS — L97919 Non-pressure chronic ulcer of unspecified part of right lower leg with unspecified severity: Secondary | ICD-10-CM

## 2015-12-31 DIAGNOSIS — I83019 Varicose veins of right lower extremity with ulcer of unspecified site: Secondary | ICD-10-CM | POA: Diagnosis not present

## 2015-12-31 DIAGNOSIS — M79604 Pain in right leg: Secondary | ICD-10-CM | POA: Diagnosis not present

## 2015-12-31 NOTE — Therapy (Signed)
[place], [place], [address] Phone: [phone]   Fax:  [phone]  Wound Care Therapy  Patient Details  Name: John Sampson MRN: [id number] Date of Birth: [birth year] No Data Recorded  Encounter Date: 12/31/2015      PT End of Session - 12/31/15 1158    Visit Number 3   Number of Visits 12   Date for PT Re-Evaluation 01/22/16   Authorization Type Medicare    Authorization - Visit Number 3   Authorization - Number of Visits 10   PT Start Time 1100   PT Stop Time 1138   PT Time Calculation (min) 38 min   Activity Tolerance Patient tolerated treatment well   Behavior During Therapy [facility] for tasks assessed/performed      Past Medical History  Diagnosis Date  . COPD (chronic obstructive pulmonary disease) ([place]) 3.12.14    2D Echo, EF 50-55%  . GERD (gastroesophageal reflux disease)   . Headache(784.0)   . Coronary atherosclerosis of native coronary artery     Multivessel status post CABG  . Myocardial infarction ([place])   . PAD (peripheral artery disease) (HCC)     Moderate bilateral SFA disease at angiography 01/2013  . Gastric ulcer     Small - nonbleeding  . Iron deficiency anemia     Negative Givens capsule study   . Pituitary macroadenoma ([place])   . Atrial flutter [facility])     Past Surgical History  Procedure Laterality Date  . Coronary artery bypass graft  2003    5 grafts - details not clear  . Eye surgery  2012  . Tonsillectomy  Age 54  . Craniotomy  12/31/2011    Procedure: CRANIOTOMY HYPOPHYSECTOMY TRANSNASAL APPROACH;  Surgeon: [name], MD;  Location: [place] NEURO ORS;  Service: Neurosurgery;  Laterality: N/A;  Transphenoidal Hypophysectomy With Fat Graft Harvest from right abdomen   . Colonoscopy  08/18/2012    Procedure: COLONOSCOPY;  Surgeon: [name], MD;  [facility];  Service: Endoscopy;  Laterality: N/A;  1:25  . Esophagogastroduodenoscopy  08/18/2012    Procedure:  ESOPHAGOGASTRODUODENOSCOPY (EGD);  Surgeon: [name], MD;  [facility];  Service: Endoscopy;  Laterality: N/A;  . Givens capsule study N/A 01/25/2013    Procedure: GIVENS CAPSULE STUDY;  Surgeon: [name], MD;  [facility];  Service: Endoscopy;  Laterality: N/A;  730  . Lower extremity angiogram N/A 02/19/2013    Procedure: LOWER EXTREMITY ANGIOGRAM;  Surgeon: [name], MD;  Location[facility] CATH LAB;  Service: Cardiovascular;  Laterality: N/A;  . Abdominal aortagram N/A 02/19/2013    Procedure: ABDOMINAL AORTAGRAM;  Surgeon: [name], MD;  Location: [facility] CATH LAB;  Service: Cardiovascular;  Laterality: N/A;  . Stress myocardial perfusion  12/08/2011    Indications: Abnormal EKG, Eval of prior GABG  . Pv angiogram  02/19/13    Indications: slow healing left calf ulcer  . Tee without cardioversion N/A 06/16/2015    Procedure: TRANSESOPHAGEAL ECHOCARDIOGRAM (TEE);  Surgeon: Satira Sark, MD;  Location: [facility] ORS;  Service: Cardiovascular;  Laterality: N/A;  . Cardioversion N/A 06/16/2015    Procedure: CARDIOVERSION;  Surgeon: Satira Sark, MD;  Location: [facility] ORS;  Service: Cardiovascular;  Laterality: N/A;    There were no vitals filed for this visit.  Visit Diagnosis:  Venous ulcer of right leg (HCC)  Right leg pain  Wound Therapy - 12/31/15 1147    Subjective Patient reports he has been having some pain in his calf from the profore lite wrap, but did not take it off; otherwise doing well today    Patient and Family Stated Goals wounds to heal    Pain Assessment No/denies pain   Evaluation and Treatment Procedures Explained to Patient/Family Yes   Evaluation and Treatment Procedures agreed to   Wound Properties Date First Assessed: 12/25/15 Wound Type: Other (Comment) , venous ulcer   Location: Leg Location Orientation: Right;Lower Wound Description (Comments): R medial wound  Present on Admission: Yes   Dressing  Type Gauze (Comment)  medihoney   Dressing Changed Changed   Dressing Status Clean   Dressing Change Frequency Daily   % Wound base Red or Granulating 10%   % Wound base Yellow 90%   % Wound base Black 0%   Margins Unattached edges (unapproximated)   Closure None   Drainage Amount None   Drainage Description No odor   Treatment Cleansed;Debridement (Selective);Packing (Impregnated strip);Other (Comment)  medihoney   Wound Properties Date First Assessed: 12/25/15 Wound Type: Other (Comment) , venous ulcer   Location: Leg Location Orientation: Right;Lower , anterior   Present on Admission: Yes   Dressing Type Impregnated gauze (bismuth)   Dressing Changed Changed   Dressing Status Clean   Dressing Change Frequency Daily   % Wound base Red or Granulating 100%   % Wound base Yellow 0%   % Wound base Black 0%   Margins Unattached edges (unapproximated)   Closure None   Drainage Amount None   Drainage Description No odor   Treatment Cleansed;Other (Comment)  xeroform    Wound Properties Date First Assessed: 12/25/15 Wound Type: Other (Comment) , venous ulcer   Location: Leg , R lateral lower leg   Location Orientation: Right;Lower , lateral   Present on Admission: Yes   Dressing Type Impregnated gauze (bismuth)   Dressing Changed Changed   Dressing Status Clean   Dressing Change Frequency Daily   % Wound base Red or Granulating 70%   % Wound base Yellow 30%   % Wound base Black 0%   % Wound base Other (Comment) 0%   Margins Unattached edges (unapproximated)   Closure None   Drainage Amount None   Drainage Description No odor   Treatment Cleansed;Other (Comment)  xeroform    Wound Therapy - Clinical Statement Noted that today was patient's last scheduled session and encouraged him to make more; pateint reported that he will not know his schedule for sure until after cardiologist visit tomorrow and will stop by late this week/early next week to schedule more appointments.Continued  with treatments to wounds on lower extremitiy as performed last session. Continue to note presence of slough however it is becoming more pliable and more easily removed. Did note some additional multiple small wounds that had opened up at this point and placed xeroform over them (no debridement performed or needed) and will continue to monitor moving forward. also noted small area near ball of patient's foot that appears to be possibly ready to develop into blister, advised patient to keep an eye on this. Patient educated to take off wraps if he has pain in leg or toes, or color changes in toes.    Wound Therapy - Functional Problem List non-healing wounds    Factors Delaying/Impairing Wound Healing Other (comment)   Wound Therapy - Frequency Other (comment)   Wound Therapy - Current Recommendations PT  Wound Plan debridement and use of appropriate wound dressing; profore lite as tolerated                               Problem List Patient Active Problem List   Diagnosis Date Noted  . Venous stasis ulcers ([place]) 12/24/2015  . Chronic venous insufficiency 12/24/2015  . Atherosclerosis of extremity with ulceration ([place]) 12/24/2015  . CHF (congestive heart failure) ([place]) 12/24/2015  . Critical lower limb ischemia 12/24/2015  . Obesity 12/24/2015  . PAF (paroxysmal atrial fibrillation) ([place]) 12/24/2015  . PAD (peripheral artery disease) ([place]) 06/07/2014  . COPD (chronic obstructive pulmonary disease) ([place]) 01/23/2013  . Essential hypertension, benign 08/03/2012  . Mixed hyperlipidemia 08/03/2012  . Hx of CABG 08/03/2012   Deniece Ree PT, DPT [facility] [address] [place], [place], [address] Phone: [phone]   Fax:  [phone]  Name: John Sampson MRN: [id number] Date of Birth: 1936-09-18

## 2016-01-01 ENCOUNTER — Other Ambulatory Visit (HOSPITAL_COMMUNITY)
Admission: RE | Admit: 2016-01-01 | Discharge: 2016-01-01 | Disposition: A | Discharge status: Home / Self Care | Payer: Medicare Other | Source: Ambulatory Visit | Attending: Cardiology | Admitting: Cardiology

## 2016-01-01 ENCOUNTER — Ambulatory Visit (INDEPENDENT_AMBULATORY_CARE_PROVIDER_SITE_OTHER): Payer: Medicare Other | Admitting: Adult Health

## 2016-01-01 ENCOUNTER — Ambulatory Visit (HOSPITAL_COMMUNITY)
Admission: RE | Admit: 2016-01-01 | Discharge: 2016-01-01 | Disposition: A | Discharge status: Home / Self Care | Payer: Medicare Other | Source: Ambulatory Visit | Attending: Cardiology | Admitting: Cardiology

## 2016-01-01 ENCOUNTER — Encounter: Payer: Self-pay | Admitting: Adult Health

## 2016-01-01 VITALS — BP 140/62 | HR 76 | Ht 69.0 in | Wt 224.0 lb

## 2016-01-01 DIAGNOSIS — I509 Heart failure, unspecified: Secondary | ICD-10-CM

## 2016-01-01 DIAGNOSIS — I358 Other nonrheumatic aortic valve disorders: Secondary | ICD-10-CM | POA: Insufficient documentation

## 2016-01-01 DIAGNOSIS — I272 Other secondary pulmonary hypertension: Secondary | ICD-10-CM | POA: Insufficient documentation

## 2016-01-01 DIAGNOSIS — E785 Hyperlipidemia, unspecified: Secondary | ICD-10-CM | POA: Diagnosis not present

## 2016-01-01 DIAGNOSIS — Z951 Presence of aortocoronary bypass graft: Secondary | ICD-10-CM | POA: Insufficient documentation

## 2016-01-01 DIAGNOSIS — M79602 Pain in left arm: Secondary | ICD-10-CM

## 2016-01-01 DIAGNOSIS — I10 Essential (primary) hypertension: Secondary | ICD-10-CM | POA: Insufficient documentation

## 2016-01-01 DIAGNOSIS — I517 Cardiomegaly: Secondary | ICD-10-CM | POA: Diagnosis not present

## 2016-01-01 DIAGNOSIS — R0602 Shortness of breath: Secondary | ICD-10-CM | POA: Insufficient documentation

## 2016-01-01 DIAGNOSIS — I70232 Atherosclerosis of native arteries of right leg with ulceration of calf: Secondary | ICD-10-CM

## 2016-01-01 DIAGNOSIS — I5032 Chronic diastolic (congestive) heart failure: Secondary | ICD-10-CM | POA: Diagnosis not present

## 2016-01-01 DIAGNOSIS — I071 Rheumatic tricuspid insufficiency: Secondary | ICD-10-CM | POA: Diagnosis not present

## 2016-01-01 DIAGNOSIS — M79605 Pain in left leg: Secondary | ICD-10-CM

## 2016-01-01 DIAGNOSIS — I340 Nonrheumatic mitral (valve) insufficiency: Secondary | ICD-10-CM | POA: Insufficient documentation

## 2016-01-01 LAB — BASIC METABOLIC PANEL
Anion gap: 11 (ref 5–15)
BUN: 18 mg/dL (ref 6–20)
CO2: 26 mmol/L (ref 22–32)
Calcium: 9.4 mg/dL (ref 8.9–10.3)
Chloride: 102 mmol/L (ref 101–111)
Creatinine, Ser: 1.11 mg/dL (ref 0.61–1.24)
GFR calc Af Amer: >60 mL/min (ref >60)
GFR calc non Af Amer: >60 mL/min (ref >60)
Glucose, Bld: 104 mg/dL — ABNORMAL HIGH (ref 65–99)
Potassium: 4.1 mmol/L (ref 3.5–5.1)
Sodium: 139 mmol/L (ref 135–145)

## 2016-01-01 NOTE — Progress Notes (Signed)
Cardiology Office Note   Date:  01/01/2016   ID:  John Sampson, DOB 1936/03/01, MRN [id number]  PCP:  Alonza Bogus, MD  Cardiologist: [place]/  Jory Sims, NP   No chief complaint on file.     History of Present Illness: John Sampson is a 80 y.o. male who presents for ongoing assessment and management of CAD, with history of CABG x5, in 2003; low risk Myoview 2013, peripheral vascular disease, with a PDA in 2014 by Dr. Alvester Chou, showing moderate PVD.  Also, history of paroxysmal atrial fibrillation, placed on ELIQUIS, with a TEE cardioversion on 06/16/2015.  He was last seen in the office on 12/24/2015 after being seen by vein and vascular do to lower sternum.  The ulcers.  ABIs were significantly depressed, and he was found to be in CHF in the office.  A planned angiogram by Dr. Bridgett Larsson is deferred until CHF symptoms are relieved.  The patient admitted that he was not taking his Lasix as directed, due to frequent urination.  The patient was placed back on Lasix 40 mg twice a day for 2 days and then to restart Lasix 40 mg daily.  He was scheduled for an echocardiogram and a pro BNP.  He is given samples of ELIQUIS and advised on medication assistance program.  It does not appear, at the time of this office visit that his echocardiogram has been completed.  Much better, having lost 20 pounds.  He states that he is staying away from salt and doing much better.  He is anxious to move forward with his surgery for PVD. He is continuing to go to the wound center. He complains of continued pain in the left leg wound site.   Past Medical History  Diagnosis Date  . COPD (chronic obstructive pulmonary disease) ([place]) 3.12.14    2D Echo, EF 50-55%  . GERD (gastroesophageal reflux disease)   . Headache(784.0)   . Coronary atherosclerosis of native coronary artery     Multivessel status post CABG  . Myocardial infarction ([place])   . PAD (peripheral artery disease) (HCC)     Moderate bilateral SFA  disease at angiography 01/2013  . Gastric ulcer     Small - nonbleeding  . Iron deficiency anemia     Negative Givens capsule study   . Pituitary macroadenoma ([place])   . Atrial flutter [facility])     Past Surgical History  Procedure Laterality Date  . Coronary artery bypass graft  2003    5 grafts - details not clear  . Eye surgery  2012  . Tonsillectomy  Age 78  . Craniotomy  12/31/2011    Procedure: CRANIOTOMY HYPOPHYSECTOMY TRANSNASAL APPROACH;  Surgeon: [name], MD;  Location: [place] NEURO ORS;  Service: Neurosurgery;  Laterality: N/A;  Transphenoidal Hypophysectomy With Fat Graft Harvest from right abdomen   . Colonoscopy  08/18/2012    Procedure: COLONOSCOPY;  Surgeon: [name], MD;  [facility];  Service: Endoscopy;  Laterality: N/A;  1:25  . Esophagogastroduodenoscopy  08/18/2012    Procedure: ESOPHAGOGASTRODUODENOSCOPY (EGD);  Surgeon: [name], MD;  [facility];  Service: Endoscopy;  Laterality: N/A;  . Givens capsule study N/A 01/25/2013    Procedure: GIVENS CAPSULE STUDY;  Surgeon: [name], MD;  [facility];  Service: Endoscopy;  Laterality: N/A;  730  . Lower extremity angiogram N/A 02/19/2013    Procedure: LOWER EXTREMITY ANGIOGRAM;  Surgeon: [name], MD;  Location:  [place] CATH LAB;  Service: Cardiovascular;  Laterality: N/A;  . Abdominal aortagram N/A 02/19/2013    Procedure: ABDOMINAL AORTAGRAM;  Surgeon: [name], MD;  Location: [facility] CATH LAB;  Service: Cardiovascular;  Laterality: N/A;  . Stress myocardial perfusion  12/08/2011    Indications: Abnormal EKG, Eval of prior GABG  . Pv angiogram  02/19/13    Indications: slow healing left calf ulcer  . Tee without cardioversion N/A 06/16/2015    Procedure: TRANSESOPHAGEAL ECHOCARDIOGRAM (TEE);  Surgeon: Satira Sark, MD;  Location: [facility] ORS;  Service: Cardiovascular;  Laterality: N/A;  . Cardioversion N/A 06/16/2015    Procedure: CARDIOVERSION;  Surgeon: Satira Sark, MD;  Location: [facility] ORS;  Service: Cardiovascular;  Laterality: N/A;     Current Outpatient Prescriptions  Medication Sig Dispense Refill  . acetaminophen (TYLENOL) 325 MG tablet Take 650 mg by mouth daily as needed for headache.    . albuterol (PROVENTIL HFA) 108 (90 BASE) MCG/ACT inhaler Inhale 2 puffs into the lungs every 6 (six) hours as needed for wheezing or shortness of breath. 1 Inhaler 3  . apixaban (ELIQUIS) 5 MG TABS tablet Take 1 tablet (5 mg total) by mouth 2 (two) times daily. 60 tablet 3  . diltiazem (CARDIZEM CD) 240 MG 24 hr capsule Take 1 capsule (240 mg total) by mouth daily. 90 capsule 3  . ferrous sulfate 325 (65 FE) MG tablet Take 1 tablet (325 mg total) by mouth 2 (two) times daily with a meal.    . fish oil-omega-3 fatty acids 1000 MG capsule Take 2 g by mouth daily.    . Fluticasone Furoate-Vilanterol (BREO ELLIPTA IN) Inhale 1 puff into the lungs daily.     . folic acid (FOLVITE) 1 MG tablet Take 1 mg by mouth daily.     . furosemide (LASIX) 40 MG tablet Take 1 tablet (40 mg total) by mouth daily. 30 tablet 6  . lisinopril (PRINIVIL,ZESTRIL) 10 MG tablet Take 1 tablet (10 mg total) by mouth daily. 90 tablet 3  . metoprolol succinate (TOPROL-XL) 50 MG 24 hr tablet Take 1 tablet (50 mg total) by mouth daily. 30 tablet 11  . nitroGLYCERIN (NITROSTAT) 0.4 MG SL tablet Place 1 tablet (0.4 mg total) under the tongue every 5 (five) minutes as needed for chest pain. 30 tablet 9  . omeprazole (PRILOSEC) 20 MG capsule Take 20 mg by mouth 2 (two) times daily before a meal.    . pantoprazole (PROTONIX) 40 MG tablet Take 40 mg by mouth 2 (two) times daily.     . potassium chloride (MICRO-K) 10 MEQ CR capsule Take 10 mEq by mouth daily.     . simvastatin (ZOCOR) 40 MG tablet Take 1 tablet (40 mg total) by mouth daily. 30 tablet 6  . traMADol (ULTRAM) 50 MG tablet Take 1 tablet (50 mg total) by mouth every 6 (six) hours as needed. 20 tablet 0   No current  facility-administered medications for this visit.    Allergies:   Review of patient's allergies indicates no known allergies.    Social History:  The patient  reports that he quit smoking about 13 years ago. His smoking use included Cigarettes. He started smoking about 60 years ago. He has a 135 pack-year smoking history. He has never used smokeless tobacco. He reports that he does not drink alcohol or use illicit drugs.   Family History:  The patient's family history includes CAD in his mother; Heart disease in his father.  ROS: All other systems are reviewed and negative. Unless otherwise mentioned in H&P    PHYSICAL EXAM: VS:  BP 140/62 mmHg  Pulse 76  Ht 5\' 9"  (1.753 m)  Wt 224 lb (101.606 kg)  BMI 33.06 kg/m2  SpO2 94% , BMI Body mass index is 33.06 kg/(m^2). GEN: Well nourished, well developed, in no acute distress HEENT: normal Neck: no JVD, carotid bruits, or masses Cardiac: RRR; no murmurs, rubs, or gallops,no edema  Respiratory:  Clear to auscultation bilaterally, normal work of breathing GI: soft, nontender, nondistended, + BS MS: no deformity or atrophydressing to the lower left leg, mild ankle edema, distal to this. Skin: warm and dry, no rash Neuro:  Strength and sensation are intact Psych: euthymic mood, full affect   Recent Labs: 06/03/2015: Magnesium 2.0 06/10/2015: Hemoglobin 12.5*; Platelets 263 01/01/2016: BUN 18; Creatinine, Ser 1.11; Potassium 4.1; Sodium 139    Lipid Panel    Component Value Date/Time   CHOL 184 08/14/2013 0818   TRIG 108 08/14/2013 0818   HDL 44 08/14/2013 0818   CHOLHDL 4.2 08/14/2013 0818   VLDL 22 08/14/2013 0818   LDLCALC 118* 08/14/2013 0818      Wt Readings from Last 3 Encounters:  01/01/16 224 lb (101.606 kg)  12/24/15 241 lb (109.317 kg)  12/24/15 243 lb 9.6 oz (110.496 kg)        ASSESSMENT AND PLAN:  1. Acute on chronic diastolic CHF: He has done well, lost approximately 20 pounds of fluid by taking extra  doses of Lasix.  As directed above and then continue on daily Lasix 40 mg. He is avoiding salt.  Blood pressure is much better controlled.  Will not make any changes in his medication at this time.  From a cardiac standpoint.  He is able to proceed with PVD, evaluation and treatment.  2. Hypertension: Blood pressure is much better controlled.  He will continue on diltiazem as directed along with lisinopril and metoprolol.  3. Peripheral vascular disease with left lower extremity wound: I have given him one refill on tramadol, he will have refills, her primary care for wound management or peripheral vascular surgeon.  We will not manage pain.he will have followup labs prior to his arteriogram.  Current medicines are reviewed at length with the patient today.    Labs/ tests ordered today include:  No orders of the defined types were placed in this encounter.     Disposition:   FU with 6 months.   Signed, Jory Sims, NP  01/01/2016 4:10 PM    [facility] [address], [place], [place] [address] Phone: [phone]; Fax: [phone]

## 2016-01-01 NOTE — Progress Notes (Signed)
Name: John Sampson    DOB: 21-Jul-1936  Age: 80 y.o.  MR#: [id number]       PCP:  Alonza Bogus, MD      Insurance: Payor: MEDICARE / Plan: MEDICARE PART A AND B / Product Type: *No Product type* /   CC:   No chief complaint on file.   VS Filed Vitals:   01/01/16 1344  BP: 140/62  Pulse: 76  Height: 5\' 9"  (1.753 m)  Weight: 224 lb (101.606 kg)  SpO2: 94%    Weights Current Weight  01/01/16 224 lb (101.606 kg)  12/24/15 241 lb (109.317 kg)  12/24/15 243 lb 9.6 oz (110.496 kg)    Blood Pressure  BP Readings from Last 3 Encounters:  01/01/16 140/62  12/24/15 162/72  12/24/15 162/77     Admit date:  (Not on file) Last encounter with RMR:  Visit date not found   Allergy Review of patient's allergies indicates no known allergies.  Current Outpatient Prescriptions  Medication Sig Dispense Refill  . acetaminophen (TYLENOL) 325 MG tablet Take 650 mg by mouth daily as needed for headache.    . albuterol (PROVENTIL HFA) 108 (90 BASE) MCG/ACT inhaler Inhale 2 puffs into the lungs every 6 (six) hours as needed for wheezing or shortness of breath. 1 Inhaler 3  . apixaban (ELIQUIS) 5 MG TABS tablet Take 1 tablet (5 mg total) by mouth 2 (two) times daily. 60 tablet 3  . diltiazem (CARDIZEM CD) 240 MG 24 hr capsule Take 1 capsule (240 mg total) by mouth daily. 90 capsule 3  . ferrous sulfate 325 (65 FE) MG tablet Take 1 tablet (325 mg total) by mouth 2 (two) times daily with a meal.    . fish oil-omega-3 fatty acids 1000 MG capsule Take 2 g by mouth daily.    . Fluticasone Furoate-Vilanterol (BREO ELLIPTA IN) Inhale 1 puff into the lungs daily.     . folic acid (FOLVITE) 1 MG tablet Take 1 mg by mouth daily.     . furosemide (LASIX) 40 MG tablet Take 1 tablet (40 mg total) by mouth daily. 30 tablet 6  . lisinopril (PRINIVIL,ZESTRIL) 10 MG tablet Take 1 tablet (10 mg total) by mouth daily. 90 tablet 3  . metoprolol succinate (TOPROL-XL) 50 MG 24 hr tablet Take 1 tablet (50 mg total) by  mouth daily. 30 tablet 11  . nitroGLYCERIN (NITROSTAT) 0.4 MG SL tablet Place 1 tablet (0.4 mg total) under the tongue every 5 (five) minutes as needed for chest pain. 30 tablet 9  . omeprazole (PRILOSEC) 20 MG capsule Take 20 mg by mouth 2 (two) times daily before a meal.    . pantoprazole (PROTONIX) 40 MG tablet Take 40 mg by mouth 2 (two) times daily.     . potassium chloride (MICRO-K) 10 MEQ CR capsule Take 10 mEq by mouth daily.     . simvastatin (ZOCOR) 40 MG tablet Take 1 tablet (40 mg total) by mouth daily. 30 tablet 6  . traMADol (ULTRAM) 50 MG tablet Take 1 tablet (50 mg total) by mouth every 6 (six) hours as needed. 20 tablet 0   No current facility-administered medications for this visit.    Discontinued Meds:   There are no discontinued medications.  Patient Active Problem List   Diagnosis Date Noted  . Venous stasis ulcers ([place]) 12/24/2015  . Chronic venous insufficiency 12/24/2015  . Atherosclerosis of extremity with ulceration ([place]) 12/24/2015  . CHF (congestive heart failure) ([place]) 12/24/2015  .  Critical lower limb ischemia 12/24/2015  . Obesity 12/24/2015  . PAF (paroxysmal atrial fibrillation) ([place]) 12/24/2015  . PAD (peripheral artery disease) ([place]) 06/07/2014  . COPD (chronic obstructive pulmonary disease) ([place]) 01/23/2013  . Essential hypertension, benign 08/03/2012  . Mixed hyperlipidemia 08/03/2012  . Hx of CABG 08/03/2012    LABS    Component Value Date/Time   NA 139 01/01/2016 0900   NA 140 06/10/2015 0815   NA 137 06/03/2015 0801   K 4.1 01/01/2016 0900   K 4.2 06/10/2015 0815   K 4.4 06/03/2015 0801   CL 102 01/01/2016 0900   CL 104 06/10/2015 0815   CL 102 06/03/2015 0801   CO2 26 01/01/2016 0900   CO2 25 06/10/2015 0815   CO2 25 06/03/2015 0801   GLUCOSE 104* 01/01/2016 0900   GLUCOSE 91 06/10/2015 0815   GLUCOSE 93 06/03/2015 0801   BUN 18 01/01/2016 0900   BUN 15 06/10/2015 0815   BUN 17 06/03/2015 0801   CREATININE 1.11 01/01/2016  0900   CREATININE 0.98 06/10/2015 0815   CREATININE 0.88 06/03/2015 0801   CREATININE 1.00 08/14/2013 0818   CREATININE 0.72 01/01/2012 0119   CREATININE 0.71 12/31/2011 1300   CALCIUM 9.4 01/01/2016 0900   CALCIUM 9.7 06/10/2015 0815   CALCIUM 9.4 06/03/2015 0801   GFRNONAA >60 01/01/2016 0900   GFRNONAA 90* 01/01/2012 0119   GFRNONAA >90 12/31/2011 1300   GFRAA >60 01/01/2016 0900   GFRAA >90 01/01/2012 0119   GFRAA >90 12/31/2011 1300   CMP     Component Value Date/Time   NA 139 01/01/2016 0900   K 4.1 01/01/2016 0900   CL 102 01/01/2016 0900   CO2 26 01/01/2016 0900   GLUCOSE 104* 01/01/2016 0900   BUN 18 01/01/2016 0900   CREATININE 1.11 01/01/2016 0900   CREATININE 0.98 06/10/2015 0815   CALCIUM 9.4 01/01/2016 0900   PROT 6.9 08/14/2013 0818   ALBUMIN 4.4 08/14/2013 0818   AST 16 08/14/2013 0818   ALT 16 08/14/2013 0818   ALKPHOS 80 08/14/2013 0818   BILITOT 0.6 08/14/2013 0818   GFRNONAA >60 01/01/2016 0900   GFRAA >60 01/01/2016 0900       Component Value Date/Time   WBC 8.2 06/10/2015 0815   WBC 7.9 08/14/2013 0818   WBC 12.3* 12/31/2011 1300   HGB 12.5* 06/10/2015 0815   HGB 14.3 08/14/2013 0818   HGB 9.5* 02/01/2013 1142   HCT 38.7* 06/10/2015 0815   HCT 43.1 08/14/2013 0818   HCT 31.6* 02/01/2013 1142   MCV 91.9 06/10/2015 0815   MCV 89.8 08/14/2013 0818   MCV 86.9 12/31/2011 1300    Lipid Panel     Component Value Date/Time   CHOL 184 08/14/2013 0818   TRIG 108 08/14/2013 0818   HDL 44 08/14/2013 0818   CHOLHDL 4.2 08/14/2013 0818   VLDL 22 08/14/2013 0818   LDLCALC 118* 08/14/2013 0818    ABG No results found for: PHART, PCO2ART, PO2ART, HCO3, TCO2, ACIDBASEDEF, O2SAT   Lab Results  Component Value Date   TSH 2.397 08/14/2013   BNP (last 3 results) No results for input(s): BNP in the last 8760 hours.  ProBNP (last 3 results) No results for input(s): PROBNP in the last 8760 hours.  Cardiac Panel (last 3 results) No results for  input(s): CKTOTAL, CKMB, TROPONINI, RELINDX in the last 72 hours.  Iron/TIBC/Ferritin/ %Sat No results found for: IRON, TIBC, FERRITIN, IRONPCTSAT   EKG Orders placed or performed in visit  on 12/24/15  . EKG 12-Lead     Prior Assessment and Plan Problem List as of 01/01/2016      Cardiovascular and Mediastinum   Essential hypertension, benign   Last Assessment & Plan 12/24/2015 Office Visit Written 12/24/2015  2:47 PM by Erlene Quan, PA-C    Controlled      PAD (peripheral artery disease) [facility])   Last Assessment & Plan 12/24/2015 Office Visit Written 12/24/2015  2:59 PM by Erlene Quan, PA-C    Know LE PVD by prior PVA 2014      Chronic venous insufficiency   Last Assessment & Plan 12/24/2015 Office Visit Written 12/24/2015  2:51 PM by Erlene Quan, PA-C    Bilateral LE venous insufficiency with venous ulcers      Atherosclerosis of extremity with ulceration (HCC)   CHF (congestive heart failure) [facility])   Last Assessment & Plan 12/24/2015 Office Visit Written 12/24/2015  2:45 PM by Erlene Quan, PA-C    Pt has rales on exam and LE edema, no more SOB than usual.      Critical lower limb ischemia   Last Assessment & Plan 12/24/2015 Office Visit Edited 12/24/2015  2:50 PM by Erlene Quan, PA-C    Referred from Dr Lianne Moris office for treatment of CHF, pt with RLE ischemia (ABI 0.6)      PAF (paroxysmal atrial fibrillation) [facility])   Last Assessment & Plan 12/24/2015 Office Visit Written 12/24/2015  2:59 PM by Erlene Quan, PA-C    Holding NSR, CHADs VASc=5 He is not taking Eliquis- couldn't afford it, no samples available        Respiratory   COPD (chronic obstructive pulmonary disease) [facility])   Last Assessment & Plan 12/24/2015 Office Visit Written 12/24/2015  2:46 PM by Erlene Quan, PA-C    Hx of COPD and obesity        Musculoskeletal and Integument   Venous stasis ulcers ([place])     Other   Mixed hyperlipidemia   Last Assessment & Plan 06/07/2014 Office Visit Written  06/07/2014  9:11 AM by Satira Sark, MD    On statin therapy, labs follow to the [facility]. Last LDL was 118. I asked him to forward next results when obtained.      Hx of CABG   Last Assessment & Plan 06/07/2014 Office Visit Written 06/07/2014  9:10 AM by Satira Sark, MD    Symptomatically stable on medical therapy. We will request most recent office records from Dr. Rollene Fare including stress test results from last year. ECG reviewed today. No changes were made in medical regimen. He was given refills for fresh nitroglycerin. Followup arranged in 9 months.      Obesity       Imaging: No results found.

## 2016-01-01 NOTE — Patient Instructions (Signed)
Your physician recommends that you schedule a follow-up appointment in: 2 Months with Dr. Domenic Polite  Your physician recommends that you continue on your current medications as directed. Please refer to the Current Medication list given to you today.  You have been given a Rx for Tramadol  If you need a refill on your cardiac medications before your next appointment, please call your pharmacy.  Thank you for choosing [facility]!

## 2016-01-02 ENCOUNTER — Telehealth: Payer: Self-pay | Admitting: Cardiology

## 2016-01-02 ENCOUNTER — Encounter: Payer: Self-pay | Admitting: Vascular Surgery

## 2016-01-02 NOTE — Telephone Encounter (Signed)
New message ° ° ° ° °Returning a call to the nurse to get lab results °

## 2016-01-02 NOTE — Telephone Encounter (Signed)
Informed pt of lab and echo results. Pt verbalized understanding and was in agreement with plan.

## 2016-01-06 ENCOUNTER — Ambulatory Visit (HOSPITAL_COMMUNITY): Payer: Medicare Other | Admitting: Physical Therapy

## 2016-01-06 ENCOUNTER — Other Ambulatory Visit: Payer: Self-pay | Admitting: *Deleted

## 2016-01-06 DIAGNOSIS — I83019 Varicose veins of right lower extremity with ulcer of unspecified site: Secondary | ICD-10-CM | POA: Diagnosis not present

## 2016-01-06 DIAGNOSIS — M79604 Pain in right leg: Secondary | ICD-10-CM | POA: Diagnosis not present

## 2016-01-06 DIAGNOSIS — I872 Venous insufficiency (chronic) (peripheral): Secondary | ICD-10-CM

## 2016-01-06 DIAGNOSIS — L97919 Non-pressure chronic ulcer of unspecified part of right lower leg with unspecified severity: Principal | ICD-10-CM

## 2016-01-06 NOTE — Therapy (Signed)
[place], [place], [address] Phone: [phone]   Fax:  [phone]  Wound Care Therapy  Patient Details  Name: John Sampson MRN: [id number] Date of Birth: [birth year] No Data Recorded  Encounter Date: 01/06/2016      PT End of Session - 01/06/16 1459    Visit Number 4   Number of Visits 12   Date for PT Re-Evaluation 01/22/16   Authorization Type Medicare    Authorization - Visit Number 4   Authorization - Number of Visits 10   PT Start Time 1300   PT Stop Time 1338   PT Time Calculation (min) 38 min   Activity Tolerance Patient tolerated treatment well   Behavior During Therapy [facility] for tasks assessed/performed      Past Medical History  Diagnosis Date  . COPD (chronic obstructive pulmonary disease) ([place]) 3.12.14    2D Echo, EF 50-55%  . GERD (gastroesophageal reflux disease)   . Headache(784.0)   . Coronary atherosclerosis of native coronary artery     Multivessel status post CABG  . Myocardial infarction ([place])   . PAD (peripheral artery disease) (HCC)     Moderate bilateral SFA disease at angiography 01/2013  . Gastric ulcer     Small - nonbleeding  . Iron deficiency anemia     Negative Givens capsule study   . Pituitary macroadenoma ([place])   . Atrial flutter [facility])     Past Surgical History  Procedure Laterality Date  . Coronary artery bypass graft  2003    5 grafts - details not clear  . Eye surgery  2012  . Tonsillectomy  Age 80  . Craniotomy  12/31/2011    Procedure: CRANIOTOMY HYPOPHYSECTOMY TRANSNASAL APPROACH;  Surgeon: [name], MD;  Location: [facility] NEURO ORS;  Service: Neurosurgery;  Laterality: N/A;  Transphenoidal Hypophysectomy With Fat Graft Harvest from right abdomen   . Colonoscopy  08/18/2012    Procedure: COLONOSCOPY;  Surgeon: [name], MD;  Location: [facility];  Service: Endoscopy;  Laterality: N/A;  1:25  . Esophagogastroduodenoscopy  08/18/2012    Procedure:  ESOPHAGOGASTRODUODENOSCOPY (EGD);  Surgeon: [name], MD;  Location: [facility] END[facility];  Service: Endoscopy;  Laterality: N/A;  . Givens capsule study N/A 01/25/2013    Procedure: GIVENS CAPSULE STUDY;  Surgeon: [name], MD;  [facility];  Service: Endoscopy;  Laterality: N/A;  730  . Lower extremity angiogram N/A 02/19/2013    Procedure: LOWER EXTREMITY ANGIOGRAM;  Surgeon: [name], MD;  Location[facility] C[facility] LAB;  Service: Cardiovascular;  Laterality: N/A;  . Abdominal aortagram N/A 02/19/2013    Procedure: ABDOMINAL AORTAGRAM;  Surgeon: [name], MD;  Location: [facility] CATH LAB;  Service: Cardiovascular;  Laterality: N/A;  . Stress myocardial perfusion  12/08/2011    Indications: Abnormal EKG, Eval of prior GABG  . Pv angiogram  02/19/13    Indications: slow healing left calf ulcer  . Tee without cardioversion N/A 06/16/2015    Procedure: TRANSESOPHAGEAL ECHOCARDIOGRAM (TEE);  Surgeon: Satira Sark, MD;  Location: [facility] ORS;  Service: Cardiovascular;  Laterality: N/A;  . Cardioversion N/A 06/16/2015    Procedure: CARDIOVERSION;  Surgeon: Satira Sark, MD;  Location: [facility] ORS;  Service: Cardiovascular;  Laterality: N/A;    There were no vitals filed for this visit.  Visit Diagnosis:  Venous ulcer of right leg (HCC)  Right leg pain  Wound Therapy - 01/06/16 1340    Subjective Patient states that his leg hurt over the weekend.  States that dressing fell down.  Pt is to go to the vascular surgeon tomorrow to determine if he has to have a stent placed in his leg.    Patient and Family Stated Goals wounds to heal    Pain Assessment 0-10   Pain Score 7    Pain Type Acute pain   Pain Location Leg   Pain Orientation Right   Pain Descriptors / Indicators Aching;Burning   Pain Onset With Activity   Patients Stated Pain Goal 2   Pain Intervention(s) Repositioned   Multiple Pain Sites No   Evaluation and Treatment Procedures Explained  to Patient/Family Yes   Evaluation and Treatment Procedures agreed to   Wound Properties Date First Assessed: 12/25/15 Wound Type: Other (Comment) , venous ulcer   Location: Leg Location Orientation: Right;Lower Wound Description (Comments): R medial wound  Present on Admission: Yes   Dressing Type Gauze (Comment)  medihoney   Dressing Changed Changed   Dressing Status Clean;Old drainage   Dressing Change Frequency PRN   Site / Wound Assessment Friable;Painful;Red   % Wound base Red or Granulating 10%   % Wound base Yellow 90%   % Wound base Black 0%   Peri-wound Assessment Edema;Erythema (blanchable)   Margins Unattached edges (unapproximated)   Closure None   Drainage Amount Moderate   Drainage Description Green;Odor   Treatment Cleansed;Debridement (Selective)   Wound Properties Date First Assessed: 12/25/15 Wound Type: Other (Comment) , venous ulcer   Location: Leg Location Orientation: Right;Lower , anterior   Present on Admission: Yes   Dressing Type Impregnated gauze (bismuth)   Dressing Changed Changed   Dressing Status Clean;Old drainage   Dressing Change Frequency Daily   % Wound base Red or Granulating 100%   % Wound base Yellow 0%   % Wound base Black 0%   Margins Unattached edges (unapproximated)   Closure None   Drainage Amount Minimal   Drainage Description No odor   Treatment Cleansed;Debridement (Selective)   Wound Properties Date First Assessed: 12/25/15 Wound Type: Other (Comment) , venous ulcer   Location: Leg , R lateral lower leg   Location Orientation: Right;Lower , lateral   Present on Admission: Yes   Dressing Type Impregnated gauze (bismuth)   Dressing Changed Changed   Dressing Status Clean   Dressing Change Frequency Daily   % Wound base Red or Granulating 70%   % Wound base Yellow 30%   % Wound base Black 0%   % Wound base Other (Comment) 0%   Margins Unattached edges (unapproximated)   Closure None   Drainage Amount Moderate   Drainage  Description Green;Odor   Treatment Cleansed;Debridement (Selective)   Wound Therapy - Clinical Statement Pt had increased pain with dressing over the weekend.  Pt appears to have a mixture of venous as well as arterial insufficiency.  Therapist changed dressing to silverhydrofiber secondary to increased drainage, increased odor and color of drainage.  Pt will ask MD about antibiotic tomorrow as LE also had increased heat and pain.  Pt dressed with less compression to see if dressing will be less painful for patient.    Wound Therapy - Functional Problem List non-healing wounds    Factors Delaying/Impairing Wound Healing Other (comment)   Hydrotherapy Plan Debridement;Dressing change;Patient/family education   Wound Therapy - Frequency Other (comment)   Wound Therapy - Current Recommendations PT   Wound Plan  See if pt acquired antibiotic, assess how new dressing comfort ; debride as tolerated by pt.    Dressing  silverhydrofiber, 4x4, kerlix, cotton and coban.    Decrease Necrotic Tissue to 10%   Decrease Necrotic Tissue - Progress Not met   Increase Granulation Tissue to 90%   Increase Granulation Tissue - Progress Not met   Decrease Length/Width/Depth by (cm) 1.25 cm    Decrease Length/Width/Depth - Progress Not progressing   Patient/Family will be able to  recognized signs/symptoms of infection in wounds    Patient/Family Instruction Goal - Progress Progressing toward goal   Time For Goal Achievement --  6 weeks    Wound Therapy - Potential for Goals Good                 PT Education - 01/06/16 1458    Education provided Yes   Education Details ask MD at visit tomorrow if he feels antibiotics are warrented.  Pt leg has increased heat, redness and pain    Person(s) Educated Patient   Methods Explanation   Comprehension Verbalized understanding                     Problem List Patient Active Problem List   Diagnosis Date Noted  . Venous stasis ulcers ([place])  12/24/2015  . Chronic venous insufficiency 12/24/2015  . Atherosclerosis of extremity with ulceration ([place]) 12/24/2015  . CHF (congestive heart failure) ([place]) 12/24/2015  . Critical lower limb ischemia 12/24/2015  . Obesity 12/24/2015  . PAF (paroxysmal atrial fibrillation) ([place]) 12/24/2015  . PAD (peripheral artery disease) ([place]) 06/07/2014  . COPD (chronic obstructive pulmonary disease) ([employer]) 01/23/2013  . Essential hypertension, benign 08/03/2012  . Mixed hyperlipidemia 08/03/2012  . Hx of CABG 08/03/2012    Rayetta Humphrey, PT CLT [phone] 01/06/2016, 3:00 PM  [place] [address] [place], [place], [address] Phone: [phone]   Fax:  [phone]  Name: John Sampson MRN: [id number] Date of Birth: [birth year]

## 2016-01-07 ENCOUNTER — Ambulatory Visit (HOSPITAL_COMMUNITY)
Admission: RE | Admit: 2016-01-07 | Discharge: 2016-01-07 | Disposition: A | Discharge status: Home / Self Care | Payer: Medicare Other | Source: Ambulatory Visit | Attending: Vascular Surgery | Admitting: Vascular Surgery

## 2016-01-07 DIAGNOSIS — I872 Venous insufficiency (chronic) (peripheral): Secondary | ICD-10-CM | POA: Diagnosis not present

## 2016-01-07 DIAGNOSIS — I8393 Asymptomatic varicose veins of bilateral lower extremities: Secondary | ICD-10-CM | POA: Diagnosis not present

## 2016-01-08 ENCOUNTER — Encounter: Payer: Medicare Other | Admitting: Vascular Surgery

## 2016-01-08 ENCOUNTER — Encounter (HOSPITAL_COMMUNITY): Payer: Medicare Other

## 2016-01-08 ENCOUNTER — Ambulatory Visit (HOSPITAL_COMMUNITY): Payer: Medicare Other

## 2016-01-08 DIAGNOSIS — L97919 Non-pressure chronic ulcer of unspecified part of right lower leg with unspecified severity: Principal | ICD-10-CM

## 2016-01-08 DIAGNOSIS — M79604 Pain in right leg: Secondary | ICD-10-CM | POA: Diagnosis not present

## 2016-01-08 DIAGNOSIS — I83019 Varicose veins of right lower extremity with ulcer of unspecified site: Secondary | ICD-10-CM | POA: Diagnosis not present

## 2016-01-08 NOTE — Therapy (Signed)
[place], [place], [address] Phone: [phone]   Fax:  [phone]  Wound Care Therapy  Patient Details  Name: John Sampson MRN: [id number] Date of Birth: 1936-01-04 No Data Recorded  Encounter Date: 01/08/2016      PT End of Session - 01/08/16 1354    Visit Number 5   Number of Visits 12   Date for PT Re-Evaluation 01/22/16   Authorization Type Medicare    Authorization - Visit Number 5   Authorization - Number of Visits 10   PT Start Time 1300   PT Stop Time 1339   PT Time Calculation (min) 39 min   Activity Tolerance Patient tolerated treatment well   Behavior During Therapy [facility] for tasks assessed/performed      Past Medical History  Diagnosis Date  . COPD (chronic obstructive pulmonary disease) ([place]) 3.12.14    2D Echo, EF 50-55%  . GERD (gastroesophageal reflux disease)   . Headache(784.0)   . Coronary atherosclerosis of native coronary artery     Multivessel status post CABG  . Myocardial infarction ([place])   . PAD (peripheral artery disease) (HCC)     Moderate bilateral SFA disease at angiography 01/2013  . Gastric ulcer     Small - nonbleeding  . Iron deficiency anemia     Negative Givens capsule study   . Pituitary macroadenoma ([place])   . Atrial flutter [facility])     Past Surgical History  Procedure Laterality Date  . Coronary artery bypass graft  2003    5 grafts - details not clear  . Eye surgery  2012  . Tonsillectomy  Age 80  . Craniotomy  12/31/2011    Procedure: CRANIOTOMY HYPOPHYSECTOMY TRANSNASAL APPROACH;  Surgeon: [name], MD;  Location: [facility]URO ORS;  Service: Neurosurgery;  Laterality: N/A;  Transphenoidal Hypophysectomy With Fat Graft Harvest from right abdomen   . Colonoscopy  08/18/2012    Procedure: COLONOSCOPY;  Surgeon: [name], MD;  [facility];  Service: Endoscopy;  Laterality: N/A;  1:25  . Esophagogastroduodenoscopy  08/18/2012    Procedure:  ESOPHAGOGASTRODUODENOSCOPY (EGD);  Surgeon: [name], MD;  [facility];  Service: Endoscopy;  Laterality: N/A;  . Givens capsule study N/A 01/25/2013    Procedure: GIVENS CAPSULE STUDY;  Surgeon: [name], MD;  [facility];  Service: Endoscopy;  Laterality: N/A;  730  . Lower extremity angiogram N/A 02/19/2013    Procedure: LOWER EXTREMITY ANGIOGRAM;  Surgeon: [name], MD;  Location: [facility] CATH LAB;  Service: Cardiovascular;  Laterality: N/A;  . Abdominal aortagram N/A 02/19/2013    Procedure: ABDOMINAL AORTAGRAM;  Surgeon: [name], MD;  Location: [facility] CATH LAB;  Service: Cardiovascular;  Laterality: N/A;  . Stress myocardial perfusion  12/08/2011    Indications: Abnormal EKG, Eval of prior GABG  . Pv angiogram  02/19/13    Indications: slow healing left calf ulcer  . Tee without cardioversion N/A 06/16/2015    Procedure: TRANSESOPHAGEAL ECHOCARDIOGRAM (TEE);  Surgeon: Satira Sark, MD;  Location: [facility] ORS;  Service: Cardiovascular;  Laterality: N/A;  . Cardioversion N/A 06/16/2015    Procedure: CARDIOVERSION;  Surgeon: Satira Sark, MD;  Location: [facility] ORS;  Service: Cardiovascular;  Laterality: N/A;    There were no vitals filed for this visit.  Visit Diagnosis:  Venous ulcer of right leg (HCC)  Right leg pain      Subjective  Assessment - 01/08/16 1334    Subjective Pt stated MD not in office, has apt tomorrow.  Dressings are intact, pain scale 7/10 Rt LE   Currently in Pain? Yes   Pain Score 7    Pain Location Leg   Pain Orientation Right   Pain Descriptors / Indicators Aching;Burning                   Wound Therapy - 01/08/16 1334    Subjective Pt stated MD not in office, has apt tomorrow.  Dressings are intact, pain scale 7/10 Rt LE   Patient and Family Stated Goals wounds to heal    Pain Assessment 0-10   Pain Onset With Activity   Patients Stated Pain Goal 2   Pain Intervention(s) Repositioned   Multiple Pain  Sites No   Evaluation and Treatment Procedures Explained to Patient/Family Yes   Evaluation and Treatment Procedures agreed to   Wound Properties Date First Assessed: 12/25/15 Wound Type: Other (Comment) , venous ulcer   Location: Leg Location Orientation: Right;Lower Wound Description (Comments): R medial wound  Present on Admission: Yes   Dressing Type Gauze (Comment)   Dressing Changed Changed   Dressing Status Clean;Old drainage   Dressing Change Frequency PRN   Site / Wound Assessment Friable;Painful;Red   % Wound base Red or Granulating 10%   % Wound base Yellow 90%   % Wound base Black 0%   Peri-wound Assessment Edema;Erythema (blanchable)   Margins Unattached edges (unapproximated)   Closure None   Drainage Amount Moderate   Drainage Description No odor   Treatment Cleansed;Debridement (Selective)   Wound Properties Date First Assessed: 12/25/15 Wound Type: Other (Comment) , venous ulcer   Location: Leg Location Orientation: Right;Lower , anterior   Present on Admission: Yes   Dressing Type Impregnated gauze (bismuth)   Dressing Changed Changed   Dressing Status Clean;Old drainage   Dressing Change Frequency Daily   % Wound base Red or Granulating 100%   % Wound base Yellow 0%   Margins Unattached edges (unapproximated)   Closure None   Drainage Amount Minimal   Drainage Description No odor   Treatment Cleansed;Debridement (Selective)   Wound Properties Date First Assessed: 12/25/15 Wound Type: Other (Comment) , venous ulcer   Location: Leg , R lateral lower leg   Location Orientation: Right;Lower , lateral   Present on Admission: Yes   Dressing Type Impregnated gauze (bismuth)   Dressing Changed Changed   Dressing Status Clean   Dressing Change Frequency Daily   % Wound base Red or Granulating 75%   % Wound base Yellow 25%   % Wound base Black 0%   Margins Unattached edges (unapproximated)   Closure None   Drainage Amount Moderate   Drainage Description No odor    Treatment Cleansed;Debridement (Selective)   Wound Therapy - Clinical Statement Noted wound continues to be red with heat, no odor noted this session.  Pt stated he has apt scheduled with MD tomorrow.  Selective debridement to remove slough to promote healing.  Continued with xeroform and silverhydrofiber for drainage.  Pt reported improved tolerance with dressings last session, no sliding down.     Wound Therapy - Functional Problem List non-healing wounds    Factors Delaying/Impairing Wound Healing Other (comment)   Hydrotherapy Plan Debridement;Dressing change;Patient/family education   Wound Therapy - Frequency Other (comment)   Wound Therapy - Current Recommendations PT   Wound Plan See if pt acquired antibiotic; debride as tolerated by pt.  Dressing  silverhydrofiber, 4x4, kerlix, cotton and coban.           Problem List Patient Active Problem List   Diagnosis Date Noted  . Venous stasis ulcers ([place]) 12/24/2015  . Chronic venous insufficiency 12/24/2015  . Atherosclerosis of extremity with ulceration ([place]) 12/24/2015  . CHF (congestive heart failure) ([place]) 12/24/2015  . Critical lower limb ischemia 12/24/2015  . Obesity 12/24/2015  . PAF (paroxysmal atrial fibrillation) ([place]) 12/24/2015  . PAD (peripheral artery disease) ([place]) 06/07/2014  . COPD (chronic obstructive pulmonary disease) ([place]) 01/23/2013  . Essential hypertension, benign 08/03/2012  . Mixed hyperlipidemia 08/03/2012  . Hx of CABG 08/03/2012   Ihor [place], LPTA; [place]  Aldona Lento 01/08/2016, 1:55 PM  [place], [place], [address] Phone: [phone]   Fax:  [phone]  Name: John Sampson MRN: [id number] Date of Birth: May 08, 1936

## 2016-01-09 ENCOUNTER — Other Ambulatory Visit: Payer: Self-pay

## 2016-01-09 ENCOUNTER — Telehealth: Payer: Self-pay

## 2016-01-09 ENCOUNTER — Ambulatory Visit (INDEPENDENT_AMBULATORY_CARE_PROVIDER_SITE_OTHER): Payer: Medicare Other | Admitting: Vascular Surgery

## 2016-01-09 ENCOUNTER — Encounter: Payer: Self-pay | Admitting: Vascular Surgery

## 2016-01-09 VITALS — BP 146/61 | HR 66 | Ht 69.0 in | Wt 219.0 lb

## 2016-01-09 DIAGNOSIS — I70232 Atherosclerosis of native arteries of right leg with ulceration of calf: Secondary | ICD-10-CM | POA: Diagnosis not present

## 2016-01-09 MED ORDER — CEPHALEXIN 500 MG PO CAPS: 500.0000 mg | ORAL_CAPSULE | Freq: Three times a day (TID) | ORAL | Status: DC

## 2016-01-09 NOTE — Telephone Encounter (Signed)
-----   Message from Erlene Quan, [place] sent at 01/09/2016 11:57 AM EST ----- Regarding: RE: Eliquis Yes, resume ASAP post angiogram  Kerin Ransom PA-C 01/09/2016 11:57 AM  ----- Message -----    From: Denman George, RN    Sent: 01/09/2016  11:36 AM      To: Erlene Quan, PA-C Subject: Eliquis                                        Dr. Bridgett Larsson has scheduled this pt. for an Aortogram with Bilateral Runoff; poss. right leg intervention, for PVD and Right Critical Limb Ischemia on 01/19/16.  Dr. Bridgett Larsson would like him to hold Eliquis x 3 days prior to procedure.  Is this okay?

## 2016-01-09 NOTE — Progress Notes (Signed)
Established Critical Limb Ischemia Patient  History of Present Illness  John Sampson is a 80 y.o. (Jul 20, 1936) male who presents with chief complaint: return from cardiology appointment.  This patient with right calf ulcers presented previously obvious findings of CHF.  Since then he has diuresed off 25 lb.  His breathing is much improved from then.  He has also been seen at Wound care and undergone debridement of the right leg.  Again, this patient's ambulation is limited by his COPD.   The patient's PMH, PSH, SH, and FamHx are unchanged from 12/24/15.  Current Outpatient Prescriptions  Medication Sig Dispense Refill  . acetaminophen (TYLENOL) 325 MG tablet Take 650 mg by mouth daily as needed for headache.    . albuterol (PROVENTIL HFA) 108 (90 BASE) MCG/ACT inhaler Inhale 2 puffs into the lungs every 6 (six) hours as needed for wheezing or shortness of breath. 1 Inhaler 3  . apixaban (ELIQUIS) 5 MG TABS tablet Take 1 tablet (5 mg total) by mouth 2 (two) times daily. 60 tablet 3  . diltiazem (CARDIZEM CD) 240 MG 24 hr capsule Take 1 capsule (240 mg total) by mouth daily. 90 capsule 3  . ferrous sulfate 325 (65 FE) MG tablet Take 1 tablet (325 mg total) by mouth 2 (two) times daily with a meal.    . fish oil-omega-3 fatty acids 1000 MG capsule Take 2 g by mouth daily.    . Fluticasone Furoate-Vilanterol (BREO ELLIPTA IN) Inhale 1 puff into the lungs daily.     . folic acid (FOLVITE) 1 MG tablet Take 1 mg by mouth daily.     . furosemide (LASIX) 40 MG tablet Take 1 tablet (40 mg total) by mouth daily. 30 tablet 6  . lisinopril (PRINIVIL,ZESTRIL) 10 MG tablet Take 1 tablet (10 mg total) by mouth daily. 90 tablet 3  . metoprolol succinate (TOPROL-XL) 50 MG 24 hr tablet Take 1 tablet (50 mg total) by mouth daily. 30 tablet 11  . nitroGLYCERIN (NITROSTAT) 0.4 MG SL tablet Place 1 tablet (0.4 mg total) under the tongue every 5 (five) minutes as needed for chest pain. 30 tablet 9  . omeprazole  (PRILOSEC) 20 MG capsule Take 20 mg by mouth 2 (two) times daily before a meal.    . pantoprazole (PROTONIX) 40 MG tablet Take 40 mg by mouth 2 (two) times daily.     . potassium chloride (MICRO-K) 10 MEQ CR capsule Take 10 mEq by mouth daily.     . simvastatin (ZOCOR) 40 MG tablet Take 1 tablet (40 mg total) by mouth daily. 30 tablet 6  . traMADol (ULTRAM) 50 MG tablet Take 1 tablet (50 mg total) by mouth every 6 (six) hours as needed. 20 tablet 0  . cephALEXin (KEFLEX) 500 MG capsule Take 1 capsule (500 mg total) by mouth 3 (three) times daily. 21 capsule 0   No current facility-administered medications for this visit.    No Known Allergies  On ROS today: drainage from R shin, resolution DOE and SOB, greatly decreased leg swelling   Physical Examination  Filed Vitals:   01/09/16 0858 01/09/16 0901  BP: 148/60 146/61  Pulse: 66   Height: 5\' 9"  (1.753 m)   Weight: 219 lb (99.338 kg)   SpO2: 98%    Body mass index is 32.33 kg/(m^2).  General: A&O x 3, WDWN  Eyes: PERRLA, EOMI  Pulmonary: Sym exp, good air movt, CTAB, no rhonchi, & wheezing, faint rales  Cardiac: RRR, Nl  S1, S2, no Murmurs, rubs or gallops  Vascular: Vessel Right Left  Radial Palpable Palpable  Brachial Palpable Palpable  Carotid Palpable, without bruit Palpable, without bruit  Aorta Not palpable due to pannus N/A  Femoral Palpable Palpable  Popliteal Not palpable Not palpable  PT Not Palpable Not Palpable  DP Not Palpable Not Palpable   Gastrointestinal: soft, NTND, no G/R, no HSM, no masses, no CVAT B, large pannus  Musculoskeletal: M/S 5/5 throughout grossly, Extremities without ischemic changes: multiple debrided shallow ulcers in R anterior shin. B LDS, B edema 1+ (greatly improved)  Neurologic: Pain and light touch intact in extremities , Motor exam as listed above  Medical Decision Making  JARAAD FUGITT is a 80 y.o. 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 I discussed with the patient the nature of angiographic procedures, especially the limited patencies of any endovascular intervention.   The patient is aware of that the risks of an angiographic procedure include but are not limited to: bleeding, infection, access site complications, renal failure, embolization, rupture of vessel, dissection, arteriovenous fistula, possible need for emergent surgical intervention, possible need for surgical procedures to treat the patient's pathology, anaphylactic reaction to contrast, and stroke and death.    The patient is aware of the risks and agrees to proceed on 20 FEB 17.  .I discussed in depth with the patient the nature of atherosclerosis, and emphasized the importance of maximal medical management including strict control of blood pressure, blood glucose, and lipid levels, antiplatelet agents, obtaining regular exercise, and cessation of smoking.    The patient is aware that without maximal medical management the underlying atherosclerotic disease process will progress, limiting the benefit of any interventions. The patient is currently on a statin: Zocor. The patient will be started on ASA 81 mg PO daily after the angio procedure.  Thank you for allowing [place] to participate in this patient's care.   Adele Barthel, MD [facility] Office: [phone] Pager: [phone]  01/09/2016, 12:22 PM

## 2016-01-13 ENCOUNTER — Ambulatory Visit (HOSPITAL_COMMUNITY): Payer: Medicare Other | Admitting: Physical Therapy

## 2016-01-13 ENCOUNTER — Ambulatory Visit: Payer: Medicare Other | Admitting: Cardiology

## 2016-01-13 DIAGNOSIS — I83019 Varicose veins of right lower extremity with ulcer of unspecified site: Secondary | ICD-10-CM | POA: Diagnosis not present

## 2016-01-13 DIAGNOSIS — M79604 Pain in right leg: Secondary | ICD-10-CM | POA: Diagnosis not present

## 2016-01-13 DIAGNOSIS — L97919 Non-pressure chronic ulcer of unspecified part of right lower leg with unspecified severity: Principal | ICD-10-CM

## 2016-01-13 NOTE — Therapy (Signed)
[place], [place], [address] Phone: [phone]   Fax:  [phone]  Wound Care Therapy  Patient Details  Name: John Sampson MRN: [id number] Date of Birth: [birth year] No Data Recorded  Encounter Date: 01/13/2016      PT End of Session - 01/13/16 1836    Visit Number 6   Number of Visits 12   Date for PT Re-Evaluation 01/22/16   Authorization Type Medicare    Authorization - Visit Number 6   Authorization - Number of Visits 10   PT Start Time 1025   PT Stop Time 1100   PT Time Calculation (min) 35 min   Activity Tolerance Patient tolerated treatment well   Behavior During Therapy [facility] for tasks assessed/performed      Past Medical History  Diagnosis Date  . COPD (chronic obstructive pulmonary disease) ([place]) 3.12.14    2D Echo, EF 50-55%  . GERD (gastroesophageal reflux disease)   . Headache(784.0)   . Coronary atherosclerosis of native coronary artery     Multivessel status post CABG  . Myocardial infarction ([place])   . PAD (peripheral artery disease) (HCC)     Moderate bilateral SFA disease at angiography 01/2013  . Gastric ulcer     Small - nonbleeding  . Iron deficiency anemia     Negative Givens capsule study   . Pituitary macroadenoma ([place])   . Atrial flutter [facility])     Past Surgical History  Procedure Laterality Date  . Coronary artery bypass graft  2003    5 grafts - details not clear  . Eye surgery  2012  . Tonsillectomy  Age 80  . Craniotomy  12/31/2011    Procedure: CRANIOTOMY HYPOPHYSECTOMY TRANSNASAL APPROACH;  Surgeon: [name], MD;  Location: [facility] NEURO ORS;  Service: Neurosurgery;  Laterality: N/A;  Transphenoidal Hypophysectomy With Fat Graft Harvest from right abdomen   . Colonoscopy  08/18/2012    Procedure: COLONOSCOPY;  Surgeon: [name], MD;  [facility];  Service: Endoscopy;  Laterality: N/A;  1:25  . Esophagogastroduodenoscopy  08/18/2012    Procedure:  ESOPHAGOGASTRODUODENOSCOPY (EGD);  Surgeon: [name], MD;  [facility];  Service: Endoscopy;  Laterality: N/A;  . Givens capsule study N/A 01/25/2013    Procedure: GIVENS CAPSULE STUDY;  Surgeon: [name], MD;  [facility];  Service: Endoscopy;  Laterality: N/A;  730  . Lower extremity angiogram N/A 02/19/2013    Procedure: LOWER EXTREMITY ANGIOGRAM;  Surgeon: [name], MD;  Location: [facility] CATH LAB;  Service: Cardiovascular;  Laterality: N/A;  . Abdominal aortagram N/A 02/19/2013    Procedure: ABDOMINAL AORTAGRAM;  Surgeon: [name], MD;  Location: [facility] CATH LAB;  Service: Cardiovascular;  Laterality: N/A;  . Stress myocardial perfusion  12/08/2011    Indications: Abnormal EKG, Eval of prior GABG  . Pv angiogram  02/19/13    Indications: slow healing left calf ulcer  . Tee without cardioversion N/A 06/16/2015    Procedure: TRANSESOPHAGEAL ECHOCARDIOGRAM (TEE);  Surgeon: Satira Sark, MD;  Location: [facility] ORS;  Service: Cardiovascular;  Laterality: N/A;  . Cardioversion N/A 06/16/2015    Procedure: CARDIOVERSION;  Surgeon: Satira Sark, MD;  Location: [facility] ORS;  Service: Cardiovascular;  Laterality: N/A;    There were no vitals filed for this visit.  Visit Diagnosis:  Venous ulcer of right leg (HCC)  Right leg pain  Wound Therapy - 01/13/16 1821    Subjective PT states he went to MD and has sceduled an aortogram for 2/20   Patient and Family Stated Goals wounds to heal    Pain Assessment 0-10   Pain Score 6    Pain Location Leg   Pain Orientation Right   Pain Descriptors / Indicators Aching   Pain Onset With Activity   Patients Stated Pain Goal 2   Pain Intervention(s) Repositioned   Multiple Pain Sites No   Evaluation and Treatment Procedures Explained to Patient/Family Yes   Evaluation and Treatment Procedures agreed to   Wound Properties Date First Assessed: 12/25/15 Wound Type: Other (Comment) , venous  ulcer   Location: Leg Location Orientation: Right;Lower Wound Description (Comments): R medial wound  Present on Admission: Yes   Dressing Type Gauze (Comment)   Dressing Changed Changed   Dressing Status Clean;Old drainage   Dressing Change Frequency PRN   Site / Wound Assessment Friable;Painful;Red   % Wound base Red or Granulating 10%   % Wound base Yellow 90%   % Wound base Black 0%   Peri-wound Assessment Edema;Erythema (blanchable)   Margins Unattached edges (unapproximated)   Closure None   Drainage Amount Moderate   Drainage Description No odor   Treatment Cleansed;Debridement (Selective)   Wound Properties Date First Assessed: 12/25/15 Wound Type: Other (Comment) , venous ulcer   Location: Leg Location Orientation: Right;Lower , anterior   Present on Admission: Yes   Dressing Type Impregnated gauze (bismuth)   Dressing Changed Changed   Dressing Status Clean;Old drainage   Dressing Change Frequency Daily   % Wound base Red or Granulating 100%   % Wound base Yellow 0%   Margins Unattached edges (unapproximated)   Closure None   Drainage Amount Minimal   Drainage Description No odor   Treatment Cleansed;Debridement (Selective)   Wound Properties Date First Assessed: 12/25/15 Wound Type: Other (Comment) , venous ulcer   Location: Leg , R lateral lower leg   Location Orientation: Right;Lower , lateral   Present on Admission: Yes   Dressing Type Impregnated gauze (bismuth)   Dressing Changed Changed   Dressing Status Clean   Dressing Change Frequency Daily   % Wound base Red or Granulating 75%   % Wound base Yellow 25%   % Wound base Black 0%   Margins Unattached edges (unapproximated)   Closure None   Drainage Amount Moderate   Drainage Description No odor   Treatment Cleansed;Debridement (Selective)   Wound Therapy - Clinical Statement Less heat and drainage today.  Reports tenderness in LE.  PT is to have aortogram done on 2/20 with hopes to determine cause of delayed  healing.  Continued with kerlix and coban but changed to xeroform due to decreased drainage.  Moisturized LE well prior to redressing.    Wound Therapy - Functional Problem List non-healing wounds    Factors Delaying/Impairing Wound Healing Other (comment)   Hydrotherapy Plan Debridement;Dressing change;Patient/family education   Wound Therapy - Frequency Other (comment)   Wound Therapy - Current Recommendations PT   Wound Plan Continued with woundcare and appropriate dressings.   Dressing  xeroform, 4x4, kerlix, cotton and coban.                               Problem List Patient Active Problem List   Diagnosis Date Noted  . Venous stasis ulcers ([place]) 12/24/2015  . Chronic venous insufficiency 12/24/2015  .  Atherosclerosis of extremity with ulceration ([place]) 12/24/2015  . CHF (congestive heart failure) ([place]) 12/24/2015  . Critical lower limb ischemia 12/24/2015  . Obesity 12/24/2015  . PAF (paroxysmal atrial fibrillation) ([place]) 12/24/2015  . PAD (peripheral artery disease) ([place]) 06/07/2014  . COPD (chronic obstructive pulmonary disease) ([place]) 01/23/2013  . Essential hypertension, benign 08/03/2012  . Mixed hyperlipidemia 08/03/2012  . Hx of CABG 08/03/2012    Teena Irani, PTA/CLT [phone]  01/13/2016, 6:37 PM  [place] [address] [place], [place], [address] Phone: [phone]   Fax:  [phone]  Name: NEDDIE DINARDI MRN: [id number] Date of Birth: 01-Jan-1936

## 2016-01-16 ENCOUNTER — Ambulatory Visit (HOSPITAL_COMMUNITY): Payer: Medicare Other | Admitting: Physical Therapy

## 2016-01-16 DIAGNOSIS — I83019 Varicose veins of right lower extremity with ulcer of unspecified site: Secondary | ICD-10-CM | POA: Diagnosis not present

## 2016-01-16 DIAGNOSIS — L97919 Non-pressure chronic ulcer of unspecified part of right lower leg with unspecified severity: Principal | ICD-10-CM

## 2016-01-16 DIAGNOSIS — M79604 Pain in right leg: Secondary | ICD-10-CM

## 2016-01-16 NOTE — Therapy (Signed)
[place], [place], [address] Phone: [phone]   Fax:  [phone]  Wound Care Therapy/update treatment  Patient Details  Name: LONZELL DORRIS MRN: [id number] Date of Birth: 01-13-1936 No Data Recorded  Encounter Date: 01/16/2016      PT End of Session - 01/16/16 0932    Visit Number 7   Number of Visits 12   Date for PT Re-Evaluation 01/22/16   Authorization Type Medicare    Authorization - Visit Number 7   Authorization - Number of Visits 10   PT Start Time 0845   PT Stop Time 0920   PT Time Calculation (min) 35 min   Activity Tolerance Patient tolerated treatment well   Behavior During Therapy [facility] for tasks assessed/performed      Past Medical History  Diagnosis Date  . COPD (chronic obstructive pulmonary disease) ([place]) 3.12.14    2D Echo, EF 50-55%  . GERD (gastroesophageal reflux disease)   . Headache(784.0)   . Coronary atherosclerosis of native coronary artery     Multivessel status post CABG  . Myocardial infarction ([place])   . PAD (peripheral artery disease) (H[facility])     Moderate bilateral SFA disease at angiography 01/2013  . Gastric ulcer     Small - nonbleeding  . Iron deficiency anemia     Negative Givens capsule study   . Pituitary macroadenoma ([place])   . Atrial flutter [facility])     Past Surgical History  Procedure Laterality Date  . Coronary artery bypass graft  2003    5 grafts - details not clear  . Eye surgery  2012  . Tonsillectomy  Age 80  . Craniotomy  12/31/2011    Procedure: CRANIOTOMY HYPOPHYSECTOMY TRANSNASAL APPROACH;  Surgeon: [name], MD;  Location: [facility] NEURO ORS;  Service: Neurosurgery;  Laterality: N/A;  Transphenoidal Hypophysectomy With Fat Graft Harvest from right abdomen   . Colonoscopy  08/18/2012    Procedure: COLONOSCOPY;  Surgeon: [name], MD;  Location: [facility] END[facility];  Service: Endoscopy;  Laterality: N/A;  1:25  . Esophagogastroduodenoscopy  08/18/2012     Procedure: ESOPHAGOGASTRODUODENOSCOPY (EGD);  Surgeon: [name], MD;  Location: [facility] END[facility];  Service: Endoscopy;  Laterality: N/A;  . Givens capsule study N/A 01/25/2013    Procedure: GIVENS CAPSULE STUDY;  Surgeon: [name], MD;  Location[facility];  Service: Endoscopy;  Laterality: N/A;  730  . Lower extremity angiogram N/A 02/19/2013    Procedure: LOWER EXTREMITY ANGIOGRAM;  Surgeon: [name], MD;  Location: [facility] CATH LAB;  Service: Cardiovascular;  Laterality: N/A;  . Abdominal aortagram N/A 02/19/2013    Procedure: ABDOMINAL AORTAGRAM;  Surgeon: [name], MD;  Location[facility] C[facility] LAB;  Service: Cardiovascular;  Laterality: N/A;  . Stress myocardial perfusion  12/08/2011    Indications: Abnormal EKG, Eval of prior GABG  . Pv angiogram  02/19/13    Indications: slow healing left calf ulcer  . Tee without cardioversion N/A 06/16/2015    Procedure: TRANSESOPHAGEAL ECHOCARDIOGRAM (TEE);  Surgeon: Satira Sark, MD;  Location: [facility] ORS;  Service: Cardiovascular;  Laterality: N/A;  . Cardioversion N/A 06/16/2015    Procedure: CARDIOVERSION;  Surgeon: Satira Sark, MD;  Location: [facility] ORS;  Service: Cardiovascular;  Laterality: N/A;    There were no vitals filed for this visit.  Visit Diagnosis:  Venous ulcer of right leg (HCC)  Right leg pain  Wound Therapy - 01/16/16 0917    Subjective Pt states that he is not having any pain today.  He is schduled to have an arteriogram on Monday    Patient and Family Stated Goals wounds to heal    Pain Assessment No/denies pain   Evaluation and Treatment Procedures Explained to Patient/Family Yes   Evaluation and Treatment Procedures agreed to   Wound Properties Date First Assessed: 12/25/15 Wound Type: Other (Comment) , venous ulcer   Location: Leg Location Orientation: Right;Lower Wound Description (Comments): R medial wound  Present on Admission: Yes   Dressing Type Gauze  (Comment);Impregnated gauze (petrolatum)   Dressing Changed Changed  changed dressing to silver today    Dressing Status Clean;Old drainage   Dressing Change Frequency PRN   Site / Wound Assessment Clean;Red;Yellow   % Wound base Red or Granulating 25%  was 0%   % Wound base Yellow 75%  was 100%   Peri-wound Assessment Erythema (blanchable)   Wound Length (cm) 2.2 cm  was 1.8    Wound Width (cm) 1.1 cm  was 1.25   Margins Unattached edges (unapproximated)   Drainage Amount --  min to moderate   Drainage Description No odor   Treatment Cleansed  debridement was attempted but pt unable to tolerate.    Wound Properties Date First Assessed: 12/25/15 Wound Type: Other (Comment) , venous ulcer   Location: Leg Location Orientation: Right;Lower , anterior   Present on Admission: Yes   Dressing Type Impregnated gauze (bismuth)   Dressing Changed Changed  dressing changed to silver    Dressing Status Clean;Old drainage   Dressing Change Frequency PRN   % Wound base Red or Granulating 60%  was 100%   % Wound base Yellow 40%  was 0%   Peri-wound Assessment Erythema (blanchable)   Wound Length (cm) 1.5 cm  was 2.25   Wound Width (cm) 1.6 cm  was 2.25   Margins Unattached edges (unapproximated)   Drainage Amount --  min to moderate    Drainage Description No odor   Treatment Cleansed   Wound Properties Date First Assessed: 12/25/15 Wound Type: Other (Comment) , venous ulcer   Location: Leg , R lateral lower leg   Location Orientation: Right;Lower , lateral   Present on Admission: Yes   Dressing Type Impregnated gauze (bismuth)   Dressing Changed Changed  changed dressing to silver    Dressing Status Clean   Dressing Change Frequency Daily   % Wound base Red or Granulating 80%  was 0%   % Wound base Yellow 20%  was 100%   Wound Length (cm) 0.8 cm  was .7   Wound Width (cm) 0.6 cm  was .5   Margins Unattached edges (unapproximated)   Closure None   Drainage Amount --  min to  moderatt   Treatment Cleansed;Other (Comment)  manual decongestive techniques used to posterior calf.    Wound Therapy - Clinical Statement Wounds are slowly healing.  Attempted debridement but pt could not tolerate.  Noted increased edema located in posterior calf area.  Pt had manual techniques used to decrease swelling.  PT dressing changed to silver due to drainage and redness around wound bed.  Pt will have an arteriogram completed on Monday.     Wound Therapy - Functional Problem List non-healing wounds    Hydrotherapy Plan Dressing change;Other (comment)   Wound Therapy - Frequency Other (comment)  2 times a week    Wound Therapy - Current Recommendations PT  Wound Plan continue with manual for edema.   Dressing  acticoat,(silver dressing), gauze, cotton for proper pressure followed by coban.    Decrease Necrotic Tissue to 10%   Decrease Necrotic Tissue - Progress Not met   Increase Granulation Tissue to 90%   Increase Granulation Tissue - Progress Not met   Decrease Length/Width/Depth by (cm) 1.25 cm    Decrease Length/Width/Depth - Progress Not met   Patient/Family will be able to  recognized signs/symptoms of infection in wounds    Patient/Family Instruction Goal - Progress Met   Additional Wound Therapy Goal Full wound healing    Additional Wound Therapy Goal - Progress Not met   Time For Goal Achievement --  6 weeks    Wound Therapy - Potential for Goals Fair            01/16/16 0917  Wound Therapy Goals - Improve the function of patient's integumentary system by progressing the wound(s) through the phases of wound healing by:  Decrease Necrotic Tissue to 10%  Decrease Necrotic Tissue - Progress Not met  Increase Granulation Tissue to 90%  Increase Granulation Tissue - Progress Not met  Decrease Length/Width/Depth by (cm) 1.25 cm   Decrease Length/Width/Depth - Progress Not met  Patient/Family will be able to  recognized signs/symptoms of infection in wounds    Patient/Family Instruction Goal - Progress Met  Additional Wound Therapy Goal Full wound healing   Additional Wound Therapy Goal - Progress Not met  Time For Goal Achievement (6 weeks )  Wound Therapy - Potential for Goals Fair                        Problem List Patient Active Problem List   Diagnosis Date Noted  . Venous stasis ulcers ([place]) 12/24/2015  . Chronic venous insufficiency 12/24/2015  . Atherosclerosis of extremity with ulceration ([place]) 12/24/2015  . CHF (congestive heart failure) ([place]) 12/24/2015  . Critical lower limb ischemia 12/24/2015  . Obesity 12/24/2015  . PAF (paroxysmal atrial fibrillation) ([place]) 12/24/2015  . PAD (peripheral artery disease) ([place]) 06/07/2014  . COPD (chronic obstructive pulmonary disease) ([place]) 01/23/2013  . Essential hypertension, benign 08/03/2012  . Mixed hyperlipidemia 08/03/2012  . Hx of CABG 08/03/2012   Rayetta Humphrey, PT CLT [phone] 01/16/2016, 11:56 AM  [facility] [address] [place], [place], [address] Phone: [phone]   Fax:  [phone]  Name: John Sampson MRN: [id number] Date of Birth: 20-Jul-1936

## 2016-01-19 ENCOUNTER — Ambulatory Visit (HOSPITAL_COMMUNITY)
Admission: RE | Admit: 2016-01-19 | Discharge: 2016-01-19 | Disposition: A | Discharge status: Home / Self Care | Payer: Medicare Other | Source: Ambulatory Visit | Attending: Vascular Surgery | Admitting: Vascular Surgery

## 2016-01-19 ENCOUNTER — Encounter (HOSPITAL_COMMUNITY)
Admission: RE | Disposition: A | Discharge status: Home / Self Care | Payer: Self-pay | Source: Ambulatory Visit | Attending: Vascular Surgery

## 2016-01-19 DIAGNOSIS — J449 Chronic obstructive pulmonary disease, unspecified: Secondary | ICD-10-CM | POA: Diagnosis not present

## 2016-01-19 DIAGNOSIS — Z7901 Long term (current) use of anticoagulants: Secondary | ICD-10-CM | POA: Insufficient documentation

## 2016-01-19 DIAGNOSIS — I998 Other disorder of circulatory system: Secondary | ICD-10-CM | POA: Diagnosis present

## 2016-01-19 DIAGNOSIS — L97219 Non-pressure chronic ulcer of right calf with unspecified severity: Secondary | ICD-10-CM | POA: Diagnosis not present

## 2016-01-19 DIAGNOSIS — I70232 Atherosclerosis of native arteries of right leg with ulceration of calf: Secondary | ICD-10-CM | POA: Diagnosis not present

## 2016-01-19 DIAGNOSIS — I509 Heart failure, unspecified: Secondary | ICD-10-CM | POA: Insufficient documentation

## 2016-01-19 DIAGNOSIS — I70229 Atherosclerosis of native arteries of extremities with rest pain, unspecified extremity: Secondary | ICD-10-CM | POA: Diagnosis present

## 2016-01-19 HISTORY — PX: PERIPHERAL VASCULAR CATHETERIZATION: SHX172C

## 2016-01-19 LAB — POCT I-STAT, CHEM 8
BUN: 14 mg/dL (ref 6–20)
Calcium, Ion: 1.28 mmol/L (ref 1.13–1.30)
Chloride: 105 mmol/L (ref 101–111)
Creatinine, Ser: 0.8 mg/dL (ref 0.61–1.24)
Glucose, Bld: 93 mg/dL (ref 65–99)
HEMATOCRIT: 38 % — AB (ref 39.0–52.0)
HEMOGLOBIN: 12.9 g/dL — AB (ref 13.0–17.0)
Potassium: 4.4 mmol/L (ref 3.5–5.1)
SODIUM: 141 mmol/L (ref 135–145)
TCO2: 24 mmol/L (ref 0–100)

## 2016-01-19 LAB — PROTIME-INR
INR: 1.15 (ref 0.00–1.49)
Prothrombin Time: 14.9 seconds (ref 11.6–15.2)

## 2016-01-19 SURGERY — ABDOMINAL AORTOGRAM
Anesthesia: LOCAL

## 2016-01-19 MED ORDER — ACETAMINOPHEN 325 MG PO TABS: 650.0000 mg | ORAL_TABLET | ORAL | Status: DC | PRN

## 2016-01-19 MED ORDER — MIDAZOLAM HCL 2 MG/2ML IJ SOLN
INTRAMUSCULAR | Status: DC | PRN
  Administered 2016-01-19: 1 mg via INTRAVENOUS

## 2016-01-19 MED ORDER — LIDOCAINE HCL (PF) 1 % IJ SOLN
INTRAMUSCULAR | Status: AC
  Filled 2016-01-19: qty 30

## 2016-01-19 MED ORDER — FENTANYL CITRATE (PF) 100 MCG/2ML IJ SOLN
INTRAMUSCULAR | Status: DC | PRN
  Administered 2016-01-19: 50 ug via INTRAVENOUS

## 2016-01-19 MED ORDER — HEPARIN (PORCINE) IN NACL 2-0.9 UNIT/ML-% IJ SOLN
INTRAMUSCULAR | Status: AC
  Filled 2016-01-19: qty 1000

## 2016-01-19 MED ORDER — IODIXANOL 320 MG/ML IV SOLN
INTRAVENOUS | Status: DC | PRN
  Administered 2016-01-19: 90 mL via INTRAVENOUS

## 2016-01-19 MED ORDER — SODIUM CHLORIDE 0.9 % IV SOLN
INTRAVENOUS | Status: DC
  Administered 2016-01-19: 11:00:00 via INTRAVENOUS

## 2016-01-19 MED ORDER — HEPARIN (PORCINE) IN NACL 2-0.9 UNIT/ML-% IJ SOLN
INTRAMUSCULAR | Status: DC | PRN
  Administered 2016-01-19: 1000 mL

## 2016-01-19 MED ORDER — SODIUM CHLORIDE 0.9 % IV SOLN: 1.0000 mL/kg/h | INTRAVENOUS | Status: DC

## 2016-01-19 MED ORDER — FENTANYL CITRATE (PF) 100 MCG/2ML IJ SOLN
INTRAMUSCULAR | Status: AC
  Filled 2016-01-19: qty 2

## 2016-01-19 MED ORDER — LIDOCAINE HCL (PF) 1 % IJ SOLN
INTRAMUSCULAR | Status: DC | PRN
  Administered 2016-01-19: 12 mL

## 2016-01-19 MED ORDER — MIDAZOLAM HCL 2 MG/2ML IJ SOLN
INTRAMUSCULAR | Status: AC
  Filled 2016-01-19: qty 2

## 2016-01-19 SURGICAL SUPPLY — 11 items
CATH OMNI FLUSH 5F 65CM (CATHETERS) ×3 IMPLANT
CATH STRAIGHT 5FR 65CM (CATHETERS) ×3 IMPLANT
COVER PRB 48X5XTLSCP FOLD TPE (BAG) ×2 IMPLANT
COVER PROBE 5X48 (BAG) ×1
KIT MICROINTRODUCER STIFF 5F (SHEATH) ×3 IMPLANT
KIT PV (KITS) ×3 IMPLANT
SHEATH PINNACLE 5F 10CM (SHEATH) ×3 IMPLANT
SYR MEDRAD MARK V 150ML (SYRINGE) ×3 IMPLANT
TRANSDUCER W/STOPCOCK (MISCELLANEOUS) ×3 IMPLANT
TRAY PV CATH (CUSTOM PROCEDURE TRAY) ×3 IMPLANT
WIRE BENTSON .035X145CM (WIRE) ×3 IMPLANT

## 2016-01-19 NOTE — Discharge Instructions (Signed)

## 2016-01-19 NOTE — Op Note (Addendum)
OPERATIVE NOTE   PROCEDURE: 1.  Left common femoral artery cannulation under ultrasound guidance 2.  Placement of catheter in aorta 3.  Aortogram 4.  Conscious sedation for 21 minutes 5.  Second order arterial selection 6.  Right leg runoff via catheter 7.  Left leg runoff via sheath  PRE-OPERATIVE DIAGNOSIS: right leg ulcer  POST-OPERATIVE DIAGNOSIS: same as above   SURGEON: Adele Barthel, MD  ANESTHESIA: conscious sedation  ESTIMATED BLOOD LOSS: 50 cc  CONTRAST: 90 cc  FINDING(S):  Aorta: patent  Superior mesenteric artery: patent Celiac artery: patent   Right Left  RA patent patent  CIA Patent, but calcific disease ~50% Patent but calcific disease  EIA Patent but calcific disease Patent but calcific disease  IIA Patent Patent  CFA Patent Patent  SFA Patent but mid-segment has multiple high grade stenoses in series Patent  PFA Patent, extensive collaterals Patent  Pop Patent, rapid filling from both SFA and PFA collaterals Patent  Trif Aberrant trifurcation, common peroneal and AT trunk Patent  AT Patent proximally, occludes shortly after takeoff Patent proximally, occludes shortly after takeoff  Pero Aberrant common AT and peroneal trunk with patent peroneal Patent  PT Patent, dominant runoff to foot Patent, dominant runoff to foot   SPECIMEN(S):  none  INDICATIONS:   John Sampson is a 80 y.o. male who presents with right calf ulcer.  The patient presents for: aortogram, bilateral leg runoff, and possible intervention.  I discussed with the patient the nature of angiographic procedures, especially the limited patencies of any endovascular intervention.  The patient is aware of that the risks of an angiographic procedure include but are not limited to: bleeding, infection, access site complications, renal failure, embolization, rupture of vessel, dissection, possible need for emergent surgical intervention, possible need for surgical procedures to treat the  patient's pathology, and stroke and death.  The patient is aware of the risks and agrees to proceed.  DESCRIPTION: After full informed consent was obtained from the patient, the patient was brought back to the angiography suite.  The patient was placed supine upon the angiography table and connected to cardiopulmonary monitoring equipment.  The patient was then given conscious sedation, the amounts of which are documented in the patient's chart.  A circulating radiologic technician maintained continuous monitoring of the patient's cardiopulmonary status.  Additionally, the control room radiologic technician provided backup monitoring throughout the procedure.  The patient was prepped and drape in the standard fashion for an angiographic procedure.  At this point, attention was turned to the left groin.  Under ultrasound guidance, the subcutaneous tissue surrounding the left common femoral artery was anesthesized with 1% lidocaine with epinephrine.  The artery was then cannulated with a micropuncture needle.  The microwire was advanced into the iliac arterial system.  The needle was exchanged for a microsheath, which was loaded into the common femoral artery over the wire.  The microwire was exchanged for a [facility] wire which was advanced into the aorta.  The microsheath was then exchanged for a 5-Fr sheath which was loaded into the common femoral artery.  The Omniflush catheter was then loaded over the wire up to the level of L1.  The catheter was connected to the power injector circuit.  After de-airring and de-clotting the circuit, a power injector aortogram was completed.  The findings are listed above.  The [facility] wire was replaced in the catheter, and using the [place] and Omniflush catheter, the right common iliac artery was selected.  The  wire was advanced into the external iliac artery but the catheter caught on the calcific iliac arterial system.  I exchanged the catheter for a straight catheter.  This  catheter was able to track into the right external iliac artery.  An automated right leg runoff was completed after de-airring and de-clotting the circuit.  The findings are as listed above.    The [facility] wire was replaced in the catheter to straighten the crook of the catheter.  Both were removed together from the sheath.  The left sheath was aspirated and no clot was present.  The sheath was connected to the power injector circuit.  An automated left leg runoff was completed after de-airring and de-clotting the circuit.  The findings are as listed above.  The sheath was aspirated.  No clots were present and the sheath was reloaded with heparinized saline.    Based on the images, this patient needs: no immediate intervention as the blood flow in the right leg is rapid. He has essentially auto-bypassed the serial stenoses.  In the event he does not heal, he can be considered for an antegrade access and possible stenting of the mid-segment of the right superficial femoral artery.   COMPLICATIONS: none  CONDITION: stable   Adele Barthel, MD [facility] [place] Office: [phone] Pager: [phone]  01/19/2016, 2:35 PM

## 2016-01-19 NOTE — H&P (View-Only) (Signed)
Established Critical Limb Ischemia Patient  History of Present Illness  John Sampson is a 80 y.o. (Dec 02, 1935) male who presents with chief complaint: return from cardiology appointment.  This patient with right calf ulcers presented previously obvious findings of CHF.  Since then he has diuresed off 25 lb.  His breathing is much improved from then.  He has also been seen at Wound care and undergone debridement of the right leg.  Again, this patient's ambulation is limited by his COPD.   The patient's PMH, PSH, SH, and FamHx are unchanged from 12/24/15.  Current Outpatient Prescriptions  Medication Sig Dispense Refill  . acetaminophen (TYLENOL) 325 MG tablet Take 650 mg by mouth daily as needed for headache.    . albuterol (PROVENTIL HFA) 108 (90 BASE) MCG/ACT inhaler Inhale 2 puffs into the lungs every 6 (six) hours as needed for wheezing or shortness of breath. 1 Inhaler 3  . apixaban (ELIQUIS) 5 MG TABS tablet Take 1 tablet (5 mg total) by mouth 2 (two) times daily. 60 tablet 3  . diltiazem (CARDIZEM CD) 240 MG 24 hr capsule Take 1 capsule (240 mg total) by mouth daily. 90 capsule 3  . ferrous sulfate 325 (65 FE) MG tablet Take 1 tablet (325 mg total) by mouth 2 (two) times daily with a meal.    . fish oil-omega-3 fatty acids 1000 MG capsule Take 2 g by mouth daily.    . Fluticasone Furoate-Vilanterol (BREO ELLIPTA IN) Inhale 1 puff into the lungs daily.     . folic acid (FOLVITE) 1 MG tablet Take 1 mg by mouth daily.     . furosemide (LASIX) 40 MG tablet Take 1 tablet (40 mg total) by mouth daily. 30 tablet 6  . lisinopril (PRINIVIL,ZESTRIL) 10 MG tablet Take 1 tablet (10 mg total) by mouth daily. 90 tablet 3  . metoprolol succinate (TOPROL-XL) 50 MG 24 hr tablet Take 1 tablet (50 mg total) by mouth daily. 30 tablet 11  . nitroGLYCERIN (NITROSTAT) 0.4 MG SL tablet Place 1 tablet (0.4 mg total) under the tongue every 5 (five) minutes as needed for chest pain. 30 tablet 9  . omeprazole  (PRILOSEC) 20 MG capsule Take 20 mg by mouth 2 (two) times daily before a meal.    . pantoprazole (PROTONIX) 40 MG tablet Take 40 mg by mouth 2 (two) times daily.     . potassium chloride (MICRO-K) 10 MEQ CR capsule Take 10 mEq by mouth daily.     . simvastatin (ZOCOR) 40 MG tablet Take 1 tablet (40 mg total) by mouth daily. 30 tablet 6  . traMADol (ULTRAM) 50 MG tablet Take 1 tablet (50 mg total) by mouth every 6 (six) hours as needed. 20 tablet 0  . cephALEXin (KEFLEX) 500 MG capsule Take 1 capsule (500 mg total) by mouth 3 (three) times daily. 21 capsule 0   No current facility-administered medications for this visit.    No Known Allergies  On ROS today: drainage from R shin, resolution DOE and SOB, greatly decreased leg swelling   Physical Examination  Filed Vitals:   01/09/16 0858 01/09/16 0901  BP: 148/60 146/61  Pulse: 66   Height: 5\' 9"  (1.753 m)   Weight: 219 lb (99.338 kg)   SpO2: 98%    Body mass index is 32.33 kg/(m^2).  General: A&O x 3, WDWN  Eyes: PERRLA, EOMI  Pulmonary: Sym exp, good air movt, CTAB, no rhonchi, & wheezing, faint rales  Cardiac: RRR, Nl  S1, S2, no Murmurs, rubs or gallops  Vascular: Vessel Right Left  Radial Palpable Palpable  Brachial Palpable Palpable  Carotid Palpable, without bruit Palpable, without bruit  Aorta Not palpable due to pannus N/A  Femoral Palpable Palpable  Popliteal Not palpable Not palpable  PT Not Palpable Not Palpable  DP Not Palpable Not Palpable   Gastrointestinal: soft, NTND, no G/R, no HSM, no masses, no CVAT B, large pannus  Musculoskeletal: M/S 5/5 throughout grossly, Extremities without ischemic changes: multiple debrided shallow ulcers in R anterior shin. B LDS, B edema 1+ (greatly improved)  Neurologic: Pain and light touch intact in extremities , Motor exam as listed above  Medical Decision Making  John Sampson is a 80 y.o. 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 I discussed with the patient the nature of angiographic procedures, especially the limited patencies of any endovascular intervention.   The patient is aware of that the risks of an angiographic procedure include but are not limited to: bleeding, infection, access site complications, renal failure, embolization, rupture of vessel, dissection, arteriovenous fistula, possible need for emergent surgical intervention, possible need for surgical procedures to treat the patient's pathology, anaphylactic reaction to contrast, and stroke and death.    The patient is aware of the risks and agrees to proceed on 20 FEB 17.  .I discussed in depth with the patient the nature of atherosclerosis, and emphasized the importance of maximal medical management including strict control of blood pressure, blood glucose, and lipid levels, antiplatelet agents, obtaining regular exercise, and cessation of smoking.    The patient is aware that without maximal medical management the underlying atherosclerotic disease process will progress, limiting the benefit of any interventions. The patient is currently on a statin: Zocor. The patient will be started on ASA 81 mg PO daily after the angio procedure.  Thank you for allowing [place] to participate in this patient's care.   Adele Barthel, MD [facility] [place] Office: [phone] Pager: [phone]  01/09/2016, 12:22 PM

## 2016-01-19 NOTE — Interval H&P Note (Signed)
[facility]  History and Physical Update  The patient was interviewed and re-examined.  The patient's previous History and Physical has been reviewed and is unchanged from my consult .  There is no change in the plan of care: aortogram, bilateral leg runoff, and possible right leg intervention.  Adele Barthel, MD [facility] [place] Office: [phone] Pager: [phone]  01/19/2016, 10:41 AM

## 2016-01-20 ENCOUNTER — Encounter (HOSPITAL_COMMUNITY): Payer: Self-pay | Admitting: Vascular Surgery

## 2016-01-21 ENCOUNTER — Telehealth: Payer: Self-pay | Admitting: Vascular Surgery

## 2016-01-21 NOTE — Telephone Encounter (Signed)
-----   Message from Mena Goes, RN sent at 01/19/2016  3:35 PM EST ----- Regarding: schedule   ----- Message -----    From: [place] , MD    Sent: 01/19/2016   2:43 PM      To: [facility] [place] [id number] 1936/09/18   PROCEDURE: 1.  Left common femoral artery cannulation under ultrasound guidance 2.  Placement of catheter in aorta 3.  Aortogram 4.  Conscious sedation for 21 minutes 5.  Second order arterial selection 6.  Right leg runoff via catheter 7.  Left leg runoff via sheath  Follow-up: 4 weeks

## 2016-01-21 NOTE — Telephone Encounter (Signed)
Spoke with pt to schedule, dpm °

## 2016-01-22 ENCOUNTER — Ambulatory Visit (HOSPITAL_COMMUNITY): Payer: Medicare Other | Admitting: Physical Therapy

## 2016-01-22 DIAGNOSIS — M79604 Pain in right leg: Secondary | ICD-10-CM | POA: Diagnosis not present

## 2016-01-22 DIAGNOSIS — L97919 Non-pressure chronic ulcer of unspecified part of right lower leg with unspecified severity: Principal | ICD-10-CM

## 2016-01-22 DIAGNOSIS — I83019 Varicose veins of right lower extremity with ulcer of unspecified site: Secondary | ICD-10-CM

## 2016-01-22 NOTE — Therapy (Signed)
[place], [place], [address] Phone: [phone]   Fax:  [phone]  Wound Care Therapy  Patient Details  Name: John Sampson MRN: [id number] Date of Birth: 1936-05-07 No Data Recorded  Encounter Date: 01/22/2016      PT End of Session - 01/22/16 1106    Visit Number 8   Number of Visits 12   Date for PT Re-Evaluation 01/22/16   Authorization Type Medicare    Authorization - Visit Number 8   Authorization - Number of Visits 10   PT Start Time 1020   PT Stop Time 1049   PT Time Calculation (min) 29 min   Activity Tolerance Patient tolerated treatment well      Past Medical History  Diagnosis Date  . COPD (chronic obstructive pulmonary disease) ([place]) 3.12.14    2D Echo, EF 50-55%  . GERD (gastroesophageal reflux disease)   . Headache(784.0)   . Coronary atherosclerosis of native coronary artery     Multivessel status post CABG  . Myocardial infarction ([place])   . PAD (peripheral artery disease) (HCC)     Moderate bilateral SFA disease at angiography 01/2013  . Gastric ulcer     Small - nonbleeding  . Iron deficiency anemia     Negative Givens capsule study   . Pituitary macroadenoma ([place])   . Atrial flutter [facility])     Past Surgical History  Procedure Laterality Date  . Coronary artery bypass graft  2003    5 grafts - details not clear  . Eye surgery  2012  . Tonsillectomy  Age 80  . Craniotomy  12/31/2011    Procedure: CRANIOTOMY HYPOPHYSECTOMY TRANSNASAL APPROACH;  Surgeon: [name], MD;  Location: [facility] NEURO ORS;  Service: Neurosurgery;  Laterality: N/A;  Transphenoidal Hypophysectomy With Fat Graft Harvest from right abdomen   . Colonoscopy  08/18/2012    Procedure: COLONOSCOPY;  Surgeon: [name], MD;  [facility];  Service: Endoscopy;  Laterality: N/A;  1:25  . Esophagogastroduodenoscopy  08/18/2012    Procedure: ESOPHAGOGASTRODUODENOSCOPY (EGD);  Surgeon: [name], MD;   [facility];  Service: Endoscopy;  Laterality: N/A;  . Givens capsule study N/A 01/25/2013    Procedure: GIVENS CAPSULE STUDY;  Surgeon: [name], MD;  [facility];  Service: Endoscopy;  Laterality: N/A;  730  . Lower extremity angiogram N/A 02/19/2013    Procedure: LOWER EXTREMITY ANGIOGRAM;  Surgeon: [name], MD;  Location: [facility] CATH LAB;  Service: Cardiovascular;  Laterality: N/A;  . Abdominal aortagram N/A 02/19/2013    Procedure: ABDOMINAL AORTAGRAM;  Surgeon: [name], MD;  Location: [facility] CATH LAB;  Service: Cardiovascular;  Laterality: N/A;  . Stress myocardial perfusion  12/08/2011    Indications: Abnormal EKG, Eval of prior GABG  . Pv angiogram  02/19/13    Indications: slow healing left calf ulcer  . Tee without cardioversion N/A 06/16/2015    Procedure: TRANSESOPHAGEAL ECHOCARDIOGRAM (TEE);  Surgeon: Satira Sark, MD;  Location: [facility] ORS;  Service: Cardiovascular;  Laterality: N/A;  . Cardioversion N/A 06/16/2015    Procedure: CARDIOVERSION;  Surgeon: Satira Sark, MD;  Location: [facility] ORS;  Service: Cardiovascular;  Laterality: N/A;  . Peripheral vascular catheterization N/A 01/19/2016    Procedure: Abdominal Aortogram;  Surgeon: Conrad Pixley, MD;  Location: [facility] CV LAB;  Service: Cardiovascular;  Laterality: N/A;  . Peripheral vascular catheterization Bilateral 01/19/2016  Procedure: Lower Extremity Angiography;  Surgeon: Conrad [place], MD;  Location: [place] CV LAB;  Service: Cardiovascular;  Laterality: Bilateral;    There were no vitals filed for this visit.  Visit Diagnosis:  Venous ulcer of right leg (HCC)  Right leg pain                 Wound Therapy - 01/22/16 1052    Subjective Pt states that had his arterial gram on Monday.  They expanded one of his arteries and that he is to return in 30 days and if another artery is not improving they are going to have to stent it.  Pt states he woke up in the night  with his legs burning got up and walked around and the pain was gone.    Patient and Family Stated Goals wounds to heal    Pain Assessment No/denies pain   Evaluation and Treatment Procedures Explained to Patient/Family Yes   Evaluation and Treatment Procedures agreed to   Wound Properties Date First Assessed: 12/25/15 Wound Type: Other (Comment) , venous ulcer   Location: Leg Location Orientation: Right;Lower Wound Description (Comments): R medial wound  Present on Admission: Yes   Dressing Type Gauze (Comment);Impregnated gauze (petrolatum)   Dressing Changed Changed   Dressing Status Clean;Old drainage   Dressing Change Frequency PRN   Site / Wound Assessment Clean;Other (Comment)  scab over wound therapist opted not to debride the scab.   % Wound base Red or Granulating --  unable to determine do to scab.     % Wound base Yellow --   Peri-wound Assessment --  decreased erythema   Margins Unattached edges (unapproximated)   Drainage Amount --  none   Drainage Description No odor   Wound Properties Date First Assessed: 12/25/15 Wound Type: Other (Comment) , venous ulcer   Location: Leg Location Orientation: Right;Lower , anterior   Present on Admission: Yes   Dressing Type Impregnated gauze (bismuth)   Dressing Status Clean;Old drainage   Dressing Change Frequency PRN   Site / Wound Assessment --  scab on wound therapist opted not to debride    % Wound base Red or Granulating --  scab   % Wound base Yellow --  scab   Peri-wound Assessment --   Margins Unattached edges (unapproximated)   Drainage Amount --  min to moderate    Drainage Description No odor   Wound Properties Date First Assessed: 12/25/15 Wound Type: Other (Comment) , venous ulcer   Location: Leg , R lateral lower leg   Location Orientation: Right;Lower , lateral   Present on Admission: Yes   Dressing Type Impregnated gauze (bismuth)   Dressing Status Clean   Dressing Change Frequency Daily   % Wound base Red or  Granulating 100%  was 0%   % Wound base Yellow 0%  was 100%   Margins Unattached edges (unapproximated)   Closure None   Drainage Amount --  min to moderate   Wound Therapy - Clinical Statement Middle and distal wound now have a scab on them.  Due to pt intolerance to debridement and arterial insufficiency therapist opted not to debride.  Pt Superior wound has improved granulation.  Pt has no swelling and is vocalizing relief of pain when LE is in a dependent position therefore coban was not used today as therapist did not want to add any compression.    Wound Therapy - Functional Problem List non-healing wounds    Hydrotherapy Plan Dressing  change;Other (comment)   Wound Therapy - Frequency Other (comment)  therapist decreased to1x a week at this time.    Wound Therapy - Current Recommendations PT   Wound Plan continue with manual for edema.   Dressing  leg is no longer red and there is no drainage at this time therefore therapist changed dressing back to xeroform, ab pad, kerlix and netting    Decrease Necrotic Tissue to 10%   Decrease Necrotic Tissue - Progress Progressing toward goal   Increase Granulation Tissue to 90%   Increase Granulation Tissue - Progress Progressing toward goal   Decrease Length/Width/Depth by (cm) 1.25 cm    Decrease Length/Width/Depth - Progress Progressing toward goal   Patient/Family will be able to  recognized signs/symptoms of infection in wounds    Patient/Family Instruction Goal - Progress Met   Additional Wound Therapy Goal Full wound healing    Additional Wound Therapy Goal - Progress Progressing toward goal   Time For Goal Achievement --  6 weeks    Wound Therapy - Potential for Goals Fair              01/22/16 1052  Wound Therapy Goals - Improve the function of patient's integumentary system by progressing the wound(s) through the phases of wound healing by:  Decrease Necrotic Tissue to 10%  Decrease Necrotic Tissue - Progress  Progressing toward goal  Increase Granulation Tissue to 90%  Increase Granulation Tissue - Progress Progressing toward goal  Decrease Length/Width/Depth by (cm) 1.25 cm   Decrease Length/Width/Depth - Progress Progressing toward goal  Patient/Family will be able to  recognized signs/symptoms of infection in wounds   Patient/Family Instruction Goal - Progress Met  Additional Wound Therapy Goal Full wound healing   Additional Wound Therapy Goal - Progress Progressing toward goal  Time For Goal Achievement (6 weeks )  Wound Therapy - Potential for Goals Fair                      Problem List Patient Active Problem List   Diagnosis Date Noted  . Venous stasis ulcers ([place]) 12/24/2015  . Chronic venous insufficiency 12/24/2015  . Atherosclerosis of extremity with ulceration ([place]) 12/24/2015  . CHF (congestive heart failure) ([place]) 12/24/2015  . Critical lower limb ischemia 12/24/2015  . Obesity 12/24/2015  . PAF (paroxysmal atrial fibrillation) ([place]) 12/24/2015  . PAD (peripheral artery disease) ([place]) 06/07/2014  . COPD (chronic obstructive pulmonary disease) ([place]) 01/23/2013  . Essential hypertension, benign 08/03/2012  . Mixed hyperlipidemia 08/03/2012  . Hx of CABG 08/03/2012    Rayetta Humphrey, PT CLT [phone] 01/22/2016, 11:07 AM  [facility] [address] [place], [place], [address] Phone: [phone]   Fax:  [phone]  Name: John Sampson MRN: [id number] Date of Birth: 1936-10-11

## 2016-01-28 ENCOUNTER — Ambulatory Visit (HOSPITAL_COMMUNITY): Payer: Medicare Other | Attending: Vascular Surgery

## 2016-01-28 DIAGNOSIS — I83019 Varicose veins of right lower extremity with ulcer of unspecified site: Secondary | ICD-10-CM | POA: Insufficient documentation

## 2016-01-28 DIAGNOSIS — M79604 Pain in right leg: Secondary | ICD-10-CM

## 2016-01-28 DIAGNOSIS — L97919 Non-pressure chronic ulcer of unspecified part of right lower leg with unspecified severity: Secondary | ICD-10-CM

## 2016-01-28 NOTE — Therapy (Signed)
[facility] [address] [place], [place], [address] Phone: [phone]   Fax:  [phone]  Physical Therapy Treatment (re-assessment)  Patient Details  Name: John Sampson MRN: [id number] Date of Birth: 10-20-1936 No Data Recorded  Encounter Date: 01/28/2016      PT End of Session - 01/28/16 1023    Visit Number 9   Number of Visits 12   Date for PT Re-Evaluation 02/22/16   Authorization Type Medicare    Authorization - Visit Number 9   Authorization - Number of Visits 19   PT Start Time 0935   PT Stop Time 1000   PT Time Calculation (min) 25 min   Activity Tolerance Patient tolerated treatment well   Behavior During Therapy [facility] for tasks assessed/performed      Past Medical History  Diagnosis Date  . COPD (chronic obstructive pulmonary disease) ([place]) 3.12.14    2D Echo, EF 50-55%  . GERD (gastroesophageal reflux disease)   . Headache(784.0)   . Coronary atherosclerosis of native coronary artery     Multivessel status post CABG  . Myocardial infarction ([place])   . PAD (peripheral artery disease) (HCC)     Moderate bilateral SFA disease at angiography 01/2013  . Gastric ulcer     Small - nonbleeding  . Iron deficiency anemia     Negative Givens capsule study   . Pituitary macroadenoma ([place])   . Atrial flutter [facility])     Past Surgical History  Procedure Laterality Date  . Coronary artery bypass graft  2003    5 grafts - details not clear  . Eye surgery  2012  . Tonsillectomy  Age 69  . Craniotomy  12/31/2011    Procedure: CRANIOTOMY HYPOPHYSECTOMY TRANSNASAL APPROACH;  Surgeon: [name], MD;  Location: [facility] NEURO ORS;  Service: Neurosurgery;  Laterality: N/A;  Transphenoidal Hypophysectomy With Fat Graft Harvest from right abdomen   . Colonoscopy  08/18/2012    Procedure: COLONOSCOPY;  Surgeon: [name], MD;  [facility];  Service: Endoscopy;  Laterality: N/A;  1:25  . Esophagogastroduodenoscopy   08/18/2012    Procedure: ESOPHAGOGASTRODUODENOSCOPY (EGD);  Surgeon: [name], MD;  [facility];  Service: Endoscopy;  Laterality: N/A;  . Givens capsule study N/A 01/25/2013    Procedure: GIVENS CAPSULE STUDY;  Surgeon: [name], MD;  [facility];  Service: Endoscopy;  Laterality: N/A;  730  . Lower extremity angiogram N/A 02/19/2013    Procedure: LOWER EXTREMITY ANGIOGRAM;  Surgeon: [name], MD;  Location: [facility] CATH LAB;  Service: Cardiovascular;  Laterality: N/A;  . Abdominal aortagram N/A 02/19/2013    Procedure: ABDOMINAL AORTAGRAM;  Surgeon: [name], MD;  Location[facility] C[facility] LAB;  Service: Cardiovascular;  Laterality: N/A;  . Stress myocardial perfusion  12/08/2011    Indications: Abnormal EKG, Eval of prior GABG  . Pv angiogram  02/19/13    Indications: slow healing left calf ulcer  . Tee without cardioversion N/A 06/16/2015    Procedure: TRANSESOPHAGEAL ECHOCARDIOGRAM (TEE);  Surgeon: Satira Sark, MD;  Location: [facility] ORS;  Service: Cardiovascular;  Laterality: N/A;  . Cardioversion N/A 06/16/2015    Procedure: CARDIOVERSION;  Surgeon: Satira Sark, MD;  Location: [facility] ORS;  Service: Cardiovascular;  Laterality: N/A;  . Peripheral vascular catheterization N/A 01/19/2016    Procedure: Abdominal Aortogram;  Surgeon: Conrad [place], MD;  Location: [place] CV LAB;  Service: Cardiovascular;  Laterality:  N/A;  . Peripheral vascular catheterization Bilateral 01/19/2016    Procedure: Lower Extremity Angiography;  Surgeon: Conrad [place], MD;  Location: [place] CV LAB;  Service: Cardiovascular;  Laterality: Bilateral;    There were no vitals filed for this visit.  Visit Diagnosis:  Venous ulcer of right leg (HCC)  Right leg pain      Subjective Assessment - 01/28/16 1005    Subjective Pain free today, dressing are intact, reports they slid down leg.  Stated has had the best sleep last couple nights   Currently in Pain? No/denies                        Wound Therapy - 01/28/16 1006    Subjective Pain free today, dressing are intact, reports they slid down leg.  Stated has had the best sleep last couple nights   Patient and Family Stated Goals wounds to heal    Date of Onset --  chronic   Pain Assessment No/denies pain   Evaluation and Treatment Procedures Explained to Patient/Family Yes   Evaluation and Treatment Procedures agreed to   Wound Properties Date First Assessed: 12/25/15 Wound Type: Other (Comment) , venous ulcer   Location: Leg Location Orientation: Right;Lower Wound Description (Comments): R medial wound  Present on Admission: Yes Final Assessment Date: 01/28/16   Dressing Type Gauze (Comment);Impregnated gauze (petrolatum)   Dressing Changed Changed   Dressing Status Clean;Old drainage   Dressing Change Frequency PRN   Site / Wound Assessment Clean;Other (Comment)   % Wound base Red or Granulating 100%   % Wound base Yellow 0%   % Wound base Black 0%   % Wound base Other (Comment) 0%   Peri-wound Assessment --  decreased erythema   Wound Length (cm) 0.2 cm   Wound Width (cm) 0.1 cm   Wound Depth (cm) 0 cm   Margins Attached edges (approximated)   Drainage Amount Scant   Drainage Description No odor   Treatment Cleansed;Debridement ([place])   Wound Properties Date First Assessed: 12/25/15 Wound Type: Other (Comment) , venous ulcer   Location: Leg , R lateral lower leg   Location Orientation: Right;Lower , lateral   Present on Admission: Yes Final Assessment Date: 01/28/16   Dressing Type --  just lotion and gauze on Rt LE   Dressing Status Clean;Dry;Intact   Dressing Change Frequency Daily   % Wound base Red or Granulating 100%   % Wound base Yellow 0%   Margins Attached edges (approximated)   Drainage Amount None   Treatment Cleansed   Wound Properties Date First Assessed: 12/25/15 Wound Type: Other (Comment) , venous ulcer   Location: Leg Location Orientation: Right;Lower  , anterior   Present on Admission: Yes Final Assessment Date: 01/28/16   Dressing Type --  just lotion and gauze on Rt LE   Dressing Status Clean;Old drainage   Dressing Change Frequency PRN   Site / Wound Assessment Granulation tissue   % Wound base Red or Granulating 100%   % Wound base Yellow 0%   % Wound base Black 0%   Wound Length (cm) --  Healed   Margins Attached edges (approximated)   Drainage Amount None   Drainage Description No odor   Treatment Cleansed   Wound Therapy - Clinical Statement Upon removal of dressings only 2 scabs remain.  Removal of scabs revealed full granulation on distal wound and small opening on middle wound approximately W.2x L.1xD0.  Continued with  xeroform and kerlix with netting only.  No edema present and no coban applied due to arterial insurfficiency.     Wound Therapy - Functional Problem List non-healing wounds    Hydrotherapy Plan Dressing change;Other (comment)   Wound Therapy - Frequency Other (comment)  1x/week   Wound Therapy - Current Recommendations PT   Wound Plan Anticipate wound to be fully healed next session, recommend continuing PT POC until fully healed then D/C to home care due to no further skilled intervention required.     Dressing  xeroform, 4x4, gauze and netting   Decrease Necrotic Tissue to 10%   Decrease Necrotic Tissue - Progress Met   Increase Granulation Tissue to 90%   Increase Granulation Tissue - Progress Met   Decrease Length/Width/Depth by (cm) 1.25 cm    Decrease Length/Width/Depth - Progress Met   Patient/Family will be able to  recognized signs/symptoms of infection in wounds    Patient/Family Instruction Goal - Progress Met   Additional Wound Therapy Goal Full wound healing    Additional Wound Therapy Goal - Progress Progressing toward goal   Time For Goal Achievement --  6 weeks   Wound Therapy - Potential for Goals Fair        Problem List Patient Active Problem List   Diagnosis Date Noted  .  Venous stasis ulcers ([place]) 12/24/2015  . Chronic venous insufficiency 12/24/2015  . Atherosclerosis of extremity with ulceration ([place]) 12/24/2015  . CHF (congestive heart failure) ([place]) 12/24/2015  . Critical lower limb ischemia 12/24/2015  . Obesity 12/24/2015  . PAF (paroxysmal atrial fibrillation) ([place]) 12/24/2015  . PAD (peripheral artery disease) ([place]) 06/07/2014  . COPD (chronic obstructive pulmonary disease) ([place]) 01/23/2013  . Essential hypertension, benign 08/03/2012  . Mixed hyperlipidemia 08/03/2012  . Hx of CABG 08/03/2012  Ihor Austin, LPTA; CBIS [phone]   G CODES 02-10-16 MOBILITY AND WALKING AROUND BASED ON WOUND PROPERTIES, SLOUGH, HEALING STATUS CURRENT CI GOAL CI  Deniece Ree PT, DPT [facility] [address] [place], [place], [address] Phone: [phone]   Fax:  [phone]  Name: HENSLEY TREAT MRN: [id number] Date of Birth: [birth year]

## 2016-01-29 ENCOUNTER — Ambulatory Visit (HOSPITAL_COMMUNITY): Payer: Medicare Other | Admitting: Physical Therapy

## 2016-01-29 DIAGNOSIS — I83019 Varicose veins of right lower extremity with ulcer of unspecified site: Secondary | ICD-10-CM

## 2016-01-29 DIAGNOSIS — L97919 Non-pressure chronic ulcer of unspecified part of right lower leg with unspecified severity: Principal | ICD-10-CM

## 2016-01-29 DIAGNOSIS — M79604 Pain in right leg: Secondary | ICD-10-CM

## 2016-01-29 NOTE — Therapy (Signed)
[place], [place], [address] Phone: [phone]   Fax:  [phone]  Patient Details  Name: John Sampson MRN: [id number] Date of Birth: Oct 08, 1936 Referring Provider:  Sinda Du, MD  Encounter Date: 01/29/2016  Pt came into office with bandages unraveling.  Requested to rebandage Rt LE.  Xeroform left in place over wound and used kerlix and #5 netting to secure in place.  Pt reported overall comfort at end of session.  No charge for this visit.   Teena Irani, PTA/CLT [phone]  01/29/2016, 4:51 PM  [facility] [address] [place], [place], [address] Phone: [phone]   Fax:  [phone]

## 2016-01-30 ENCOUNTER — Ambulatory Visit (HOSPITAL_COMMUNITY): Payer: Medicare Other

## 2016-02-03 ENCOUNTER — Ambulatory Visit (HOSPITAL_COMMUNITY): Payer: Medicare Other

## 2016-02-03 DIAGNOSIS — M79604 Pain in right leg: Secondary | ICD-10-CM | POA: Diagnosis not present

## 2016-02-03 DIAGNOSIS — I83019 Varicose veins of right lower extremity with ulcer of unspecified site: Secondary | ICD-10-CM | POA: Diagnosis not present

## 2016-02-03 DIAGNOSIS — L97919 Non-pressure chronic ulcer of unspecified part of right lower leg with unspecified severity: Principal | ICD-10-CM

## 2016-02-03 NOTE — Therapy (Signed)
[place], [place], [address] Phone: [phone]   Fax:  [phone]  Wound Care Therapy  Patient Details  Name: John Sampson MRN: [id number] Date of Birth: [birth year] No Data Recorded  Encounter Date: 02/03/2016      PT End of Session - 02/03/16 1757    Visit Number 10   Number of Visits 12   Date for PT Re-Evaluation 02/22/16   Authorization Type Medicare    Authorization - Visit Number 10   Authorization - Number of Visits 10   PT Start Time 6222   PT Stop Time 1750   PT Time Calculation (min) 17 min   Activity Tolerance Patient tolerated treatment well   Behavior During Therapy [facility] for tasks assessed/performed      Past Medical History  Diagnosis Date  . COPD (chronic obstructive pulmonary disease) ([place]) 3.12.14    2D Echo, EF 50-55%  . GERD (gastroesophageal reflux disease)   . Headache(784.0)   . Coronary atherosclerosis of native coronary artery     Multivessel status post CABG  . Myocardial infarction ([place])   . PAD (peripheral artery disease) (HCC)     Moderate bilateral SFA disease at angiography 01/2013  . Gastric ulcer     Small - nonbleeding  . Iron deficiency anemia     Negative Givens capsule study   . Pituitary macroadenoma ([place])   . Atrial flutter [facility])     Past Surgical History  Procedure Laterality Date  . Coronary artery bypass graft  2003    5 grafts - details not clear  . Eye surgery  2012  . Tonsillectomy  Age 57  . Craniotomy  12/31/2011    Procedure: CRANIOTOMY HYPOPHYSECTOMY TRANSNASAL APPROACH;  Surgeon: [name], MD;  Location: [place] NEURO ORS;  Service: Neurosurgery;  Laterality: N/A;  Transphenoidal Hypophysectomy With Fat Graft Harvest from right abdomen   . Colonoscopy  08/18/2012    Procedure: COLONOSCOPY;  Surgeon: [name], MD;  [facility];  Service: Endoscopy;  Laterality: N/A;  1:25  . Esophagogastroduodenoscopy  08/18/2012    Procedure:  ESOPHAGOGASTRODUODENOSCOPY (EGD);  Surgeon: [name], MD;  [facility]: [facility];  Service: Endoscopy;  Laterality: N/A;  . Givens capsule study N/A 01/25/2013    Procedure: GIVENS CAPSULE STUDY;  Surgeon: [name], MD;  [facility];  Service: Endoscopy;  Laterality: N/A;  730  . Lower extremity angiogram N/A 02/19/2013    Procedure: LOWER EXTREMITY ANGIOGRAM;  Surgeon: [name], MD;  Location: [facility] CATH LAB;  Service: Cardiovascular;  Laterality: N/A;  . Abdominal aortagram N/A 02/19/2013    Procedure: ABDOMINAL AORTAGRAM;  Surgeon: [name], MD;  Location[facility] C[facility] LAB;  Service: Cardiovascular;  Laterality: N/A;  . Stress myocardial perfusion  12/08/2011    Indications: Abnormal EKG, Eval of prior GABG  . Pv angiogram  02/19/13    Indications: slow healing left calf ulcer  . Tee without cardioversion N/A 06/16/2015    Procedure: TRANSESOPHAGEAL ECHOCARDIOGRAM (TEE);  Surgeon: Satira Sark, MD;  Location: [facility] ORS;  Service: Cardiovascular;  Laterality: N/A;  . Cardioversion N/A 06/16/2015    Procedure: CARDIOVERSION;  Surgeon: Satira Sark, MD;  Location: [facility] ORS;  Service: Cardiovascular;  Laterality: N/A;  . Peripheral vascular catheterization N/A 01/19/2016    Procedure: Abdominal Aortogram;  Surgeon: Conrad Sebastian, MD;  Location: [place] CV LAB;  Service: Cardiovascular;  Laterality: N/A;  .  Peripheral vascular catheterization Bilateral 01/19/2016    Procedure: Lower Extremity Angiography;  Surgeon: Conrad [place], MD;  Location: [place] CV LAB;  Service: Cardiovascular;  Laterality: Bilateral;    There were no vitals filed for this visit.  Visit Diagnosis:  Venous ulcer of right leg (HCC)  Right leg pain      Subjective Assessment - 02-12-16 1750    Subjective Feeling good today, dressings are intact.  Pain free today   Currently in Pain? No/denies           Wound Therapy - 02-12-16 1751    Subjective Feeling good today,  dressings are intact.  Pain free today   Patient and Family Stated Goals wounds to heal    Date of Onset --  Chronic   Pain Assessment No/denies pain   Evaluation and Treatment Procedures Explained to Patient/Family Yes   Evaluation and Treatment Procedures agreed to   Wound Therapy - Clinical Statement Upon removal of dressings all would fully healed.  No debridement necessary.  Pt educated on proper self care and signs/symptoms to look for.  Pt verbalized proper self care with no questions.     Wound Therapy - Functional Problem List non-healing wounds    Hydrotherapy Plan Dressing change;Other (comment)   Wound Therapy - Frequency Other (comment)  1x/week   Wound Therapy - Current Recommendations PT   Wound Plan No further skilled intervention necessary.   Dressing  none   Decrease Necrotic Tissue to 10%   Decrease Necrotic Tissue - Progress Met   Increase Granulation Tissue to 90%   Increase Granulation Tissue - Progress Met   Decrease Length/Width/Depth by (cm) 1.25 cm    Decrease Length/Width/Depth - Progress Met   Patient/Family will be able to  recognized signs/symptoms of infection in wounds    Patient/Family Instruction Goal - Progress Met   Additional Wound Therapy Goal Full wound healing    Additional Wound Therapy Goal - Progress Met   Time For Goal Achievement --  6 weeks   Wound Therapy - Potential for Goals Fair          Problem List Patient Active Problem List   Diagnosis Date Noted  . Venous stasis ulcers ([place]) 12/24/2015  . Chronic venous insufficiency 12/24/2015  . Atherosclerosis of extremity with ulceration ([place]) 12/24/2015  . CHF (congestive heart failure) ([place]) 12/24/2015  . Critical lower limb ischemia 12/24/2015  . Obesity 12/24/2015  . PAF (paroxysmal atrial fibrillation) ([place]) 12/24/2015  . PAD (peripheral artery disease) ([place]) 06/07/2014  . COPD (chronic obstructive pulmonary disease) ([place]) 01/23/2013  . Essential hypertension, benign  08/03/2012  . Mixed hyperlipidemia 08/03/2012  . Hx of CABG 08/03/2012    Aldona Lento 02-12-16, 6:00 PM  G Codes mobility walking around 02-12-16 Goal: [place]  D/C: [place] Based on skilled assessment of wound, fully healed   PHYSICAL THERAPY DISCHARGE SUMMARY  Visits from Start of Care: 10 visits  Current functional level related to goals / functional outcomes: Wound is healed, no further need for skilled PT wound care   Remaining deficits: None: wound healed    Education / Equipment: None Plan: Patient agrees to discharge.  Patient goals were met. Patient is being discharged due to meeting the stated rehab goals.  ?????        Elly Modena PT, DPT  Deniece Ree PT, DPT [place] [address] [place], [place], [address] Phone: [phone]   Fax:  [phone]  Name: John Sampson MRN: [id number] Date of Birth: 1936/06/10

## 2016-02-05 ENCOUNTER — Ambulatory Visit (HOSPITAL_COMMUNITY): Payer: Medicare Other | Admitting: Physical Therapy

## 2016-02-10 ENCOUNTER — Ambulatory Visit (HOSPITAL_COMMUNITY): Payer: Medicare Other | Admitting: Physical Therapy

## 2016-02-12 ENCOUNTER — Ambulatory Visit (HOSPITAL_COMMUNITY): Payer: Medicare Other | Admitting: Physical Therapy

## 2016-02-16 ENCOUNTER — Encounter: Payer: Self-pay | Admitting: Vascular Surgery

## 2016-02-16 DIAGNOSIS — I10 Essential (primary) hypertension: Secondary | ICD-10-CM | POA: Diagnosis not present

## 2016-02-16 DIAGNOSIS — I4891 Unspecified atrial fibrillation: Secondary | ICD-10-CM | POA: Diagnosis not present

## 2016-02-16 DIAGNOSIS — J449 Chronic obstructive pulmonary disease, unspecified: Secondary | ICD-10-CM | POA: Diagnosis not present

## 2016-02-16 DIAGNOSIS — I2510 Atherosclerotic heart disease of native coronary artery without angina pectoris: Secondary | ICD-10-CM | POA: Diagnosis not present

## 2016-02-17 ENCOUNTER — Ambulatory Visit (HOSPITAL_COMMUNITY): Payer: Medicare Other | Admitting: Physical Therapy

## 2016-02-19 ENCOUNTER — Ambulatory Visit (HOSPITAL_COMMUNITY): Payer: Medicare Other | Admitting: Physical Therapy

## 2016-02-19 NOTE — Progress Notes (Signed)
Postoperative Visit   History of Present Illness  John Sampson is a 80 y.o. male who presents for postoperative follow-up from procedure on Date: 01/19/16: BRo.  This demonstrated a short segment mid-SFA high graft stenosis vs occlusion.  The patient's wounds have healed.  The patient notes resolution of lower extremity symptoms.  The patient is able to complete their activities of daily living.  The patient's current symptoms are: none.  Past Medical History, Past Surgical History, Social History, Family History, Medications, Allergies, and Review of Systems are unchanged from previous evaluation on 01/19/16.  Current Outpatient Prescriptions  Medication Sig Dispense Refill  . acetaminophen (TYLENOL) 325 MG tablet Take 650 mg by mouth daily as needed for headache.    . albuterol (PROVENTIL HFA) 108 (90 BASE) MCG/ACT inhaler Inhale 2 puffs into the lungs every 6 (six) hours as needed for wheezing or shortness of breath. 1 Inhaler 3  . apixaban (ELIQUIS) 5 MG TABS tablet Take 1 tablet (5 mg total) by mouth 2 (two) times daily. 60 tablet 3  . cephALEXin (KEFLEX) 500 MG capsule Take 1 capsule (500 mg total) by mouth 3 (three) times daily. (Patient taking differently: Take 500 mg by mouth 3 (three) times daily. Will be finished 01/17/16) 21 capsule 0  . diltiazem (CARDIZEM CD) 240 MG 24 hr capsule Take 1 capsule (240 mg total) by mouth daily. 90 capsule 3  . ferrous sulfate 325 (65 FE) MG tablet Take 1 tablet (325 mg total) by mouth 2 (two) times daily with a meal.    . fish oil-omega-3 fatty acids 1000 MG capsule Take 1 g by mouth 2 (two) times daily.     . folic acid (FOLVITE) 1 MG tablet Take 1 mg by mouth daily.     . furosemide (LASIX) 40 MG tablet Take 1 tablet (40 mg total) by mouth daily. 30 tablet 6  . lisinopril (PRINIVIL,ZESTRIL) 10 MG tablet Take 1 tablet (10 mg total) by mouth daily. 90 tablet 3  . metoprolol succinate (TOPROL-XL) 50 MG 24 hr tablet Take 1 tablet (50 mg total) by  mouth daily. 30 tablet 11  . nitroGLYCERIN (NITROSTAT) 0.4 MG SL tablet Place 1 tablet (0.4 mg total) under the tongue every 5 (five) minutes as needed for chest pain. 30 tablet 9  . omeprazole (PRILOSEC) 20 MG capsule Take 20 mg by mouth 2 (two) times daily before a meal.    . potassium chloride (MICRO-K) 10 MEQ CR capsule Take 10 mEq by mouth daily.     . simvastatin (ZOCOR) 40 MG tablet Take 1 tablet (40 mg total) by mouth daily. 30 tablet 6  . traMADol (ULTRAM) 50 MG tablet Take 1 tablet (50 mg total) by mouth every 6 (six) hours as needed. (Patient not taking: Reported on 01/16/2016) 20 tablet 0   No current facility-administered medications for this visit.     For VQI Use Only  PRE-ADM LIVING: Home  AMB STATUS: Ambulatory  Physical Examination  Filed Vitals:   02/20/16 1028 02/20/16 1031  BP: 181/64 180/66  Pulse: 65   Height: 5\' 9"  (1.753 m)   Weight: 225 lb 1.6 oz (102.105 kg)   SpO2: 100%    Body mass index is 33.23 kg/(m^2).  General: A&O x 3, WDWN  Eyes: PERRLA, EOMI  Pulmonary: Sym exp, good air movt, CTAB, no rhonchi, & wheezing, faint rales  Cardiac: RRR, Nl S1, S2, no Murmurs, rubs or gallops  Vascular: Vessel Right Left  Radial Palpable  Palpable  Brachial Palpable Palpable  Carotid Palpable, without bruit Palpable, without bruit  Aorta Not palpable due to pannus N/A  Femoral Palpable Palpable  Popliteal Not palpable Not palpable  PT Not Palpable Not Palpable  DP Not Palpable Not Palpable   Gastrointestinal: soft, NTND, no G/R, no HSM, no masses, no CVAT B, large pannus  Musculoskeletal: M/S 5/5 throughout grossly, Extremities without ischemic changes, healed ulcers in R shin,. B LDS, B edema resolved   Neurologic: Pain and light touch intact in extremities , Motor exam as listed above   Medical Decision Making  John Sampson is a 80 y.o. male who presents s/p BRo for R leg ulcer due to combine fluid overload and R SFA  high grade stenosis vs occlusion . Based on his angiographic findings, this patient needs: q6 mon ABI. Due to his significant COPD and CHF, I suspect his ambulation might be limited, which will limit any intermittent claudication. If he develops any lifestyle limiting claudication, might consider R antegrade femoral access and likely stenting of R SFA. As he has develops significant collaterals around that occlusion, he may never need this. I discussed in depth with the patient the nature of atherosclerosis, and emphasized the importance of maximal medical management including strict control of blood pressure, blood glucose, and lipid levels, obtaining regular exercise, and cessation of smoking.  The patient is aware that without maximal medical management the underlying atherosclerotic disease process will progress, limiting the benefit of any interventions. The patient is currently on a statin: Zocor. The patient is currently not on an anti-platelet: as he is on anticoagulation with Eliquis.  Thank you for allowing [place] to participate in this patient's care.  Adele Barthel, MD [facility] Office: [phone] Pager: [phone]

## 2016-02-20 ENCOUNTER — Other Ambulatory Visit: Payer: Self-pay | Admitting: *Deleted

## 2016-02-20 ENCOUNTER — Ambulatory Visit (INDEPENDENT_AMBULATORY_CARE_PROVIDER_SITE_OTHER): Payer: Medicare Other | Admitting: Vascular Surgery

## 2016-02-20 ENCOUNTER — Encounter: Payer: Self-pay | Admitting: Vascular Surgery

## 2016-02-20 VITALS — BP 180/66 | HR 65 | Ht 69.0 in | Wt 225.1 lb

## 2016-02-20 DIAGNOSIS — I739 Peripheral vascular disease, unspecified: Secondary | ICD-10-CM

## 2016-02-20 DIAGNOSIS — I70232 Atherosclerosis of native arteries of right leg with ulceration of calf: Secondary | ICD-10-CM

## 2016-02-24 ENCOUNTER — Ambulatory Visit (HOSPITAL_COMMUNITY): Payer: Medicare Other | Admitting: Physical Therapy

## 2016-02-26 ENCOUNTER — Ambulatory Visit (HOSPITAL_COMMUNITY): Payer: Medicare Other | Admitting: Physical Therapy

## 2016-03-01 ENCOUNTER — Encounter: Payer: Self-pay | Admitting: Cardiology

## 2016-03-01 ENCOUNTER — Ambulatory Visit (INDEPENDENT_AMBULATORY_CARE_PROVIDER_SITE_OTHER): Payer: Medicare Other | Admitting: Cardiology

## 2016-03-01 VITALS — BP 144/70 | HR 57 | Ht 69.0 in | Wt 221.0 lb

## 2016-03-01 DIAGNOSIS — I2510 Atherosclerotic heart disease of native coronary artery without angina pectoris: Secondary | ICD-10-CM

## 2016-03-01 DIAGNOSIS — I70232 Atherosclerosis of native arteries of right leg with ulceration of calf: Secondary | ICD-10-CM

## 2016-03-01 DIAGNOSIS — E782 Mixed hyperlipidemia: Secondary | ICD-10-CM

## 2016-03-01 DIAGNOSIS — I483 Typical atrial flutter: Secondary | ICD-10-CM

## 2016-03-01 DIAGNOSIS — I5032 Chronic diastolic (congestive) heart failure: Secondary | ICD-10-CM | POA: Diagnosis not present

## 2016-03-01 DIAGNOSIS — I739 Peripheral vascular disease, unspecified: Secondary | ICD-10-CM

## 2016-03-01 MED ORDER — FUROSEMIDE 20 MG PO TABS: 20.0000 mg | ORAL_TABLET | Freq: Every day | ORAL | Status: DC

## 2016-03-01 NOTE — Progress Notes (Signed)
Cardiology Office Note  Date: 03/01/2016   ID: TYKE FOUTY, DOB 1936/05/13, MRN [id number]  PCP: Alonza Bogus, MD  Primary Cardiologist: Rozann Lesches, MD   Chief Complaint  Patient presents with  . Atrial Flutter    History of Present Illness: John Sampson is a 80 y.o. male last seen by Ms. Lawrence NP in February. He was being managed at that time for acute on chronic diastolic heart failure, achieving significant diuresis on increased dose Lasix. He presents for a follow-up visit today, doing well and has had significant improvement in his leg edema. He continues on Lasix 40 mg daily. His weight is down another 4 pounds since last visit.  He has PAD followed by Dr. Bridgett Larsson, short segment mid SFA with high-grade stenosis versus occlusion. This being followed conservatively on medical therapy at this time.  He does state that he feels nauseous intermittently, sometimes a little lightheaded. His had no chest pain or palpitations. He continues on Eliquis with no spontaneous bleeding problems. Otherwise no change in heart rate control regimen.  He follows up with his podiatrist later today.  Past Medical History  Diagnosis Date  . COPD (chronic obstructive pulmonary disease) ([place]) 3.12.14    2D Echo, EF 50-55%  . GERD (gastroesophageal reflux disease)   . Headache(784.0)   . Coronary atherosclerosis of native coronary artery     Multivessel status post CABG  . Myocardial infarction ([place])   . PAD (peripheral artery disease) (HCC)     Moderate bilateral SFA disease at angiography 01/2013  . Gastric ulcer     Small - nonbleeding  . Iron deficiency anemia     Negative Givens capsule study   . Pituitary macroadenoma ([place])   . Atrial flutter [facility])     Past Surgical History  Procedure Laterality Date  . Coronary artery bypass graft  2003    5 grafts - details not clear  . Eye surgery  2012  . Tonsillectomy  Age 68  . Craniotomy  12/31/2011    Procedure: CRANIOTOMY  HYPOPHYSECTOMY TRANSNASAL APPROACH;  Surgeon: [name], MD;  Location: [facility] NEURO ORS;  Service: Neurosurgery;  Laterality: N/A;  Transphenoidal Hypophysectomy With Fat Graft Harvest from right abdomen   . Colonoscopy  08/18/2012    Procedure: COLONOSCOPY;  Surgeon: [name], MD;  [facility];  Service: Endoscopy;  Laterality: N/A;  1:25  . Esophagogastroduodenoscopy  08/18/2012    Procedure: ESOPHAGOGASTRODUODENOSCOPY (EGD);  Surgeon: [name], MD;  [facility];  Service: Endoscopy;  Laterality: N/A;  . Givens capsule study N/A 01/25/2013    Procedure: GIVENS CAPSULE STUDY;  Surgeon: [name], MD;  [facility];  Service: Endoscopy;  Laterality: N/A;  730  . Lower extremity angiogram N/A 02/19/2013    Procedure: LOWER EXTREMITY ANGIOGRAM;  Surgeon: [name], MD;  Location: [facility] CATH LAB;  Service: Cardiovascular;  Laterality: N/A;  . Abdominal aortagram N/A 02/19/2013    Procedure: ABDOMINAL AORTAGRAM;  Surgeon: [name], MD;  Location: [facility] CATH LAB;  Service: Cardiovascular;  Laterality: N/A;  . Stress myocardial perfusion  12/08/2011    Indications: Abnormal EKG, Eval of prior GABG  . Pv angiogram  02/19/13    Indications: slow healing left calf ulcer  . Tee without cardioversion N/A 06/16/2015    Procedure: TRANSESOPHAGEAL ECHOCARDIOGRAM (TEE);  Surgeon: Satira Sark, MD;  Location: [facility] ORS;  Service: Cardiovascular;  Laterality: N/A;  . Cardioversion  N/A 06/16/2015    Procedure: CARDIOVERSION;  Surgeon: Satira Sark, MD;  Location: [facility] ORS;  Service: Cardiovascular;  Laterality: N/A;  . Peripheral vascular catheterization N/A 01/19/2016    Procedure: Abdominal Aortogram;  Surgeon: Conrad [place], MD;  Location: [place] CV LAB;  Service: Cardiovascular;  Laterality: N/A;  . Peripheral vascular catheterization Bilateral 01/19/2016    Procedure: Lower Extremity Angiography;  Surgeon: Conrad [place], MD;  Location: [place]  CV LAB;  Service: Cardiovascular;  Laterality: Bilateral;    Current Outpatient Prescriptions  Medication Sig Dispense Refill  . acetaminophen (TYLENOL) 325 MG tablet Take 650 mg by mouth daily as needed for headache.    . albuterol (PROVENTIL HFA) 108 (90 BASE) MCG/ACT inhaler Inhale 2 puffs into the lungs every 6 (six) hours as needed for wheezing or shortness of breath. 1 Inhaler 3  . apixaban (ELIQUIS) 5 MG TABS tablet Take 1 tablet (5 mg total) by mouth 2 (two) times daily. 60 tablet 3  . diltiazem (CARDIZEM CD) 240 MG 24 hr capsule Take 1 capsule (240 mg total) by mouth daily. 90 capsule 3  . ferrous sulfate 325 (65 FE) MG tablet Take 1 tablet (325 mg total) by mouth 2 (two) times daily with a meal.    . fish oil-omega-3 fatty acids 1000 MG capsule Take 1 g by mouth 2 (two) times daily.     . folic acid (FOLVITE) 1 MG tablet Take 1 mg by mouth daily.     [name] lisinopril (PRINIVIL,ZESTRIL) 10 MG tablet Take 1 tablet (10 mg total) by mouth daily. 90 tablet 3  . metoprolol succinate (TOPROL-XL) 50 MG 24 hr tablet Take 1 tablet (50 mg total) by mouth daily. 30 tablet 11  . nitroGLYCERIN (NITROSTAT) 0.4 MG SL tablet Place 1 tablet (0.4 mg total) under the tongue every 5 (five) minutes as needed for chest pain. 30 tablet 9  . omeprazole (PRILOSEC) 20 MG capsule Take 20 mg by mouth 2 (two) times daily before a meal.    . potassium chloride (MICRO-K) 10 MEQ CR capsule Take 10 mEq by mouth daily.     . simvastatin (ZOCOR) 40 MG tablet Take 1 tablet (40 mg total) by mouth daily. 30 tablet 6   No current facility-administered medications for this visit.   Allergies:  Review of patient's allergies indicates no known allergies.   Social History: The patient  reports that he quit smoking about 14 years ago. His smoking use included Cigarettes. He started smoking about 61 years ago. He has a 135 pack-year smoking history. He has never used smokeless tobacco. He reports that he does not drink alcohol or  use illicit drugs.   ROS:  Please see the history of present illness. Otherwise, complete review of systems is positive for heeled leg ulcerations.  All other systems are reviewed and negative.   Physical Exam: VS:  BP 144/70 mmHg  Pulse 57  Ht 5\' 9"  (1.753 m)  Wt 221 lb (100.245 kg)  BMI 32.62 kg/m2  SpO2 97%, BMI Body mass index is 32.62 kg/(m^2).  Wt Readings from Last 3 Encounters:  03/01/16 221 lb (100.245 kg)  02/20/16 225 lb 1.6 oz (102.105 kg)  01/19/16 220 lb (99.791 kg)    General: Overweight male, appears comfortable at rest. HEENT: Conjunctiva and lids normal, oropharynx clear. Neck: Supple, no elevated JVP or carotid bruits, no thyromegaly. Lungs: Clear to auscultation, nonlabored breathing at rest. Cardiac: Regular rate and rhythm with ectopy, no S3 or  significant systolic murmur, no pericardial rub. Abdomen: Soft, nontender, protuberant, bowel sounds present, no guarding or rebound. Extremities: Mild leg edema , distal pulses 1-2, left greater than right +. Musculoskeletal: No kyphosis. Neuropsychiatric: Alert and oriented 3, affect appropriate.  ECG: I personally reviewed the prior tracing from 12/24/2015 which showed sinus arrhythmia with LVH and repolarization abnormalities.  Recent Labwork: 06/03/2015: Magnesium 2.0 06/10/2015: Platelets 263 01/19/2016: BUN 14; Creatinine, Ser 0.80; Hemoglobin 12.9*; Potassium 4.4; Sodium 141   Other Studies Reviewed Today:  Echocardiogram 01/01/2016: Study Conclusions  - Left ventricle: The cavity size was normal. Wall thickness was  increased in a pattern of moderate LVH. The estimated ejection  fraction was 55%. Probable hypokinesis of the basalinferior  myocardium. Doppler parameters are consistent with restrictive  physiology, indicative of decreased left ventricular diastolic  compliance and/or increased left atrial pressure. - Aortic valve: Mildly calcified annulus. Trileaflet; mildly  calcified leaflets. -  Mitral valve: Calcified annulus. There was mild regurgitation. - Left atrium: The atrium was severely dilated. - Right atrium: Central venous pressure (est): 3 mm Hg. - Tricuspid valve: There was mild regurgitation. - Pulmonary arteries: Systolic pressure was moderately increased.  PA peak pressure: 53 mm Hg (S). - Pericardium, extracardiac: There was no pericardial effusion.  Impressions:  - Moderate LVH with LVEF approximately 55% and basal inferior  hypokinesis. Restrictive diastolic filling pattern is noted with  evidence of increased LV filling pressure. severe left atrial  enlargement. MAC with mild mitral regurgitation. Sclerotic aortic  valve without stenosis. Mild tricuspid regurgitation with  evidence of moderate pulmonary hypertension, PASP 53 mmHg.  Assessment and Plan:  1. History of typical atrial flutter status post cardioversion in July 2016. He continues to do well without significant palpitations. Remains on Eliquis for stroke prophylaxis in addition to standing Cardizem CD and Toprol-XL. No changes were made today.  2. Peripheral arterial disease with mid SFA stenosis evaluated by Dr. Bridgett Larsson. Patient is being followed on medical therapy for now. With associated diuresis and improving leg edema he has healed the wounds on his right foot.  3. Chronic diastolic heart failure with restrictive physiology by echocardiogram, LVEF 55%. He will continue on Lasix long-term, but we will cut back to 20 mg daily for now. If he has weight gain of 3 pounds in 24 hours or worsening leg edema, dose can be increased back to 40 mg as needed. Continue potassium supplements.  4. Multivessel CAD status post CABG. No active angina symptoms.  5. Hyperlipidemia, on Zocor.  Current medicines were reviewed with the patient today.   Orders Placed This Encounter  Procedures  . Basic Metabolic Panel (BMET)    Disposition: FU with me in 2 months.   Signed, Satira Sark, MD,  [facility] 03/01/2016 1:13 PM    [place] [facility] [address] [place], [place] [address] Phone: [phone]; Fax: [phone]

## 2016-03-01 NOTE — Patient Instructions (Signed)
Your physician recommends that you schedule a follow-up appointment in: 2 months with Dr Domenic Polite-  please get blood work 1 week before next visit   DECREASE Lasix to 20 mg daily        Thank you for choosing [facility] !

## 2016-03-02 DIAGNOSIS — G600 Hereditary motor and sensory neuropathy: Secondary | ICD-10-CM | POA: Diagnosis not present

## 2016-03-02 DIAGNOSIS — G5790 Unspecified mononeuropathy of unspecified lower limb: Secondary | ICD-10-CM | POA: Diagnosis not present

## 2016-03-02 DIAGNOSIS — M79672 Pain in left foot: Secondary | ICD-10-CM | POA: Diagnosis not present

## 2016-03-02 DIAGNOSIS — M79671 Pain in right foot: Secondary | ICD-10-CM | POA: Diagnosis not present

## 2016-03-16 DIAGNOSIS — M79671 Pain in right foot: Secondary | ICD-10-CM | POA: Diagnosis not present

## 2016-03-16 DIAGNOSIS — G600 Hereditary motor and sensory neuropathy: Secondary | ICD-10-CM | POA: Diagnosis not present

## 2016-03-16 DIAGNOSIS — G5790 Unspecified mononeuropathy of unspecified lower limb: Secondary | ICD-10-CM | POA: Diagnosis not present

## 2016-03-16 DIAGNOSIS — M79672 Pain in left foot: Secondary | ICD-10-CM | POA: Diagnosis not present

## 2016-04-06 DIAGNOSIS — M79672 Pain in left foot: Secondary | ICD-10-CM | POA: Diagnosis not present

## 2016-04-06 DIAGNOSIS — G5790 Unspecified mononeuropathy of unspecified lower limb: Secondary | ICD-10-CM | POA: Diagnosis not present

## 2016-04-06 DIAGNOSIS — M79671 Pain in right foot: Secondary | ICD-10-CM | POA: Diagnosis not present

## 2016-04-06 DIAGNOSIS — G600 Hereditary motor and sensory neuropathy: Secondary | ICD-10-CM | POA: Diagnosis not present

## 2016-04-28 DIAGNOSIS — I872 Venous insufficiency (chronic) (peripheral): Secondary | ICD-10-CM | POA: Diagnosis not present

## 2016-04-28 LAB — BASIC METABOLIC PANEL
BUN: 12 mg/dL (ref 7–25)
CHLORIDE: 106 mmol/L (ref 98–110)
CO2: 23 mmol/L (ref 20–31)
CREATININE: 0.98 mg/dL (ref 0.70–1.18)
Calcium: 9.4 mg/dL (ref 8.6–10.3)
Glucose, Bld: 92 mg/dL (ref 65–99)
POTASSIUM: 4.2 mmol/L (ref 3.5–5.3)
Sodium: 140 mmol/L (ref 135–146)

## 2016-05-04 ENCOUNTER — Encounter: Payer: Self-pay | Admitting: Cardiology

## 2016-05-04 ENCOUNTER — Ambulatory Visit (INDEPENDENT_AMBULATORY_CARE_PROVIDER_SITE_OTHER): Payer: Medicare Other | Admitting: Cardiology

## 2016-05-04 VITALS — BP 136/58 | HR 53 | Ht 69.0 in | Wt 218.0 lb

## 2016-05-04 DIAGNOSIS — D509 Iron deficiency anemia, unspecified: Secondary | ICD-10-CM | POA: Diagnosis not present

## 2016-05-04 DIAGNOSIS — I483 Typical atrial flutter: Secondary | ICD-10-CM

## 2016-05-04 DIAGNOSIS — I70232 Atherosclerosis of native arteries of right leg with ulceration of calf: Secondary | ICD-10-CM | POA: Diagnosis not present

## 2016-05-04 DIAGNOSIS — I5032 Chronic diastolic (congestive) heart failure: Secondary | ICD-10-CM

## 2016-05-04 DIAGNOSIS — I2510 Atherosclerotic heart disease of native coronary artery without angina pectoris: Secondary | ICD-10-CM | POA: Diagnosis not present

## 2016-05-04 NOTE — Progress Notes (Signed)
Cardiology Office Note  Date: 05/04/2016   ID: VIRAJ MCINERNY, DOB 09/14/1936, MRN [id number]  PCP: Alonza Bogus, MD  Primary Cardiologist: Rozann Lesches, MD   Chief Complaint  Patient presents with  . Coronary Artery Disease  . Atrial Flutter    History of Present Illness: John Sampson is a 80 y.o. male last seen in April. He presents for a routine follow-up visit. No significant angina symptoms or progressive leg swelling noted. His weight is down 3 pounds from the last visit. Lasix was cut back to 20 mg daily at the last visit with plan to intensify based on weight gain. Recent lab work is noted below.  He does tell me that he was diagnosed with fairly severe anemia in the interim based on lab work in [place]. He was referred to the [facility] and reports undergoing PRBC transfusion as well as endoscopy with colonoscopy. States that no bleeding source was identified and he may be considered for capsule endoscopy next. He was temporarily off Eliquis, but is back on at this point. He does not endorse any melena or hematochezia.  We discussed his current medications. He continues on Eliquis, Cardizem CD, Lasix, lisinopril, Zocor, and potassium supplements. Toprol-XL dose has been increased in the interim. He has not required any nitroglycerin.  Past Medical History  Diagnosis Date  . COPD (chronic obstructive pulmonary disease) ([place]) 3.12.14    2D Echo, EF 50-55%  . GERD (gastroesophageal reflux disease)   . Headache(784.0)   . Coronary atherosclerosis of native coronary artery     Multivessel status post CABG  . Myocardial infarction ([place])   . PAD (peripheral artery disease) ([facility])     Moderate bilateral SFA disease at angiography 01/2013  . Gastric ulcer     Small - nonbleeding  . Iron deficiency anemia     Negative Givens capsule study   . Pituitary macroadenoma ([place])   . Atrial flutter [facility])     Past Surgical History  Procedure Laterality Date  . Coronary artery  bypass graft  2003    5 grafts - details not clear  . Eye surgery  2012  . Tonsillectomy  Age 67  . Craniotomy  12/31/2011    Procedure: CRANIOTOMY HYPOPHYSECTOMY TRANSNASAL APPROACH;  Surgeon: [name], MD;  Location: [place] NEURO ORS;  Service: Neurosurgery;  Laterality: N/A;  Transphenoidal Hypophysectomy With Fat Graft Harvest from right abdomen   . Colonoscopy  08/18/2012    Procedure: COLONOSCOPY;  Surgeon: [name], MD;  [facility];  Service: Endoscopy;  Laterality: N/A;  1:25  . Esophagogastroduodenoscopy  08/18/2012    Procedure: ESOPHAGOGASTRODUODENOSCOPY (EGD);  Surgeon: [name], MD;  [facility] END[facility];  Service: Endoscopy;  Laterality: N/A;  . Givens capsule study N/A 01/25/2013    Procedure: GIVENS CAPSULE STUDY;  Surgeon: [name], MD;  [facility];  Service: Endoscopy;  Laterality: N/A;  730  . Lower extremity angiogram N/A 02/19/2013    Procedure: LOWER EXTREMITY ANGIOGRAM;  Surgeon: [name], MD;  Location: [facility] CATH LAB;  Service: Cardiovascular;  Laterality: N/A;  . Abdominal aortagram N/A 02/19/2013    Procedure: ABDOMINAL AORTAGRAM;  Surgeon: [name], MD;  Location: [facility] CATH LAB;  Service: Cardiovascular;  Laterality: N/A;  . Stress myocardial perfusion  12/08/2011    Indications: Abnormal EKG, Eval of prior GABG  . Pv angiogram  02/19/13    Indications: slow healing left calf ulcer  .  Tee without cardioversion N/A 06/16/2015    Procedure: TRANSESOPHAGEAL ECHOCARDIOGRAM (TEE);  Surgeon: Satira Sark, MD;  Location: [facility] ORS;  Service: Cardiovascular;  Laterality: N/A;  . Cardioversion N/A 06/16/2015    Procedure: CARDIOVERSION;  Surgeon: Satira Sark, MD;  Location: [facility] ORS;  Service: Cardiovascular;  Laterality: N/A;  . Peripheral vascular catheterization N/A 01/19/2016    Procedure: Abdominal Aortogram;  Surgeon: Conrad [place], MD;  Location: [place] CV LAB;  Service: Cardiovascular;  Laterality: N/A;  .  Peripheral vascular catheterization Bilateral 01/19/2016    Procedure: Lower Extremity Angiography;  Surgeon: Conrad Upson, MD;  Location: [place] CV LAB;  Service: Cardiovascular;  Laterality: Bilateral;    Current Outpatient Prescriptions  Medication Sig Dispense Refill  . acetaminophen (TYLENOL) 325 MG tablet Take 650 mg by mouth daily as needed for headache.    . albuterol (PROVENTIL HFA) 108 (90 BASE) MCG/ACT inhaler Inhale 2 puffs into the lungs every 6 (six) hours as needed for wheezing or shortness of breath. 1 Inhaler 3  . apixaban (ELIQUIS) 5 MG TABS tablet Take 1 tablet (5 mg total) by mouth 2 (two) times daily. 60 tablet 3  . diltiazem (CARDIZEM CD) 240 MG 24 hr capsule Take 1 capsule (240 mg total) by mouth daily. 90 capsule 3  . ferrous sulfate 325 (65 FE) MG tablet Take 1 tablet (325 mg total) by mouth 2 (two) times daily with a meal.    . fish oil-omega-3 fatty acids 1000 MG capsule Take 1 g by mouth 2 (two) times daily.     . folic acid (FOLVITE) 1 MG tablet Take 1 mg by mouth daily.     . furosemide (LASIX) 20 MG tablet Take 1 tablet (20 mg total) by mouth daily. 90 tablet 3  . lisinopril (PRINIVIL,ZESTRIL) 10 MG tablet Take 1 tablet (10 mg total) by mouth daily. 90 tablet 3  . metoprolol succinate (TOPROL-XL) 50 MG 24 hr tablet Take 50 mg by mouth 2 (two) times daily. Take with or immediately following a meal.    . nitroGLYCERIN (NITROSTAT) 0.4 MG SL tablet Place 1 tablet (0.4 mg total) under the tongue every 5 (five) minutes as needed for chest pain. 30 tablet 9  . omeprazole (PRILOSEC) 20 MG capsule Take 20 mg by mouth 2 (two) times daily before a meal.    . potassium chloride (MICRO-K) 10 MEQ CR capsule Take 10 mEq by mouth daily.     . simvastatin (ZOCOR) 40 MG tablet Take 1 tablet (40 mg total) by mouth daily. 30 tablet 6   No current facility-administered medications for this visit.   Allergies:  Review of patient's allergies indicates no known allergies.   Social  History: The patient  reports that he quit smoking about 14 years ago. His smoking use included Cigarettes. He started smoking about 61 years ago. He has a 135 pack-year smoking history. He has never used smokeless tobacco. He reports that he does not drink alcohol or use illicit drugs.   ROS:  Please see the history of present illness. Otherwise, complete review of systems is positive for improved fatigue.  All other systems are reviewed and negative.   Physical Exam: VS:  BP 136/58 mmHg  Pulse 53  Ht 5\' 9"  (1.753 m)  Wt 218 lb (98.884 kg)  BMI 32.18 kg/m2  SpO2 98%, BMI Body mass index is 32.18 kg/(m^2).  Wt Readings from Last 3 Encounters:  05/04/16 218 lb (98.884 kg)  03/01/16 221 lb (  100.245 kg)  02/20/16 225 lb 1.6 oz (102.105 kg)    General: Overweight male, appears comfortable at rest. HEENT: Conjunctiva and lids normal, oropharynx clear. Neck: Supple, no elevated JVP or carotid bruits, no thyromegaly. Lungs: Clear to auscultation, nonlabored breathing at rest. Cardiac: Regular rate and rhythm with ectopy, no S3 or significant systolic murmur, no pericardial rub. Abdomen: Soft, nontender, protuberant, bowel sounds present, no guarding or rebound. Extremities: Mild leg edema , distal pulses 1-2, left greater than right +. Musculoskeletal: No kyphosis. Neuropsychiatric: Alert and oriented 3, affect appropriate.  ECG: I personally reviewed the prior tracing from 12/24/2015 which showed sinus arrhythmia with LVH and repolarization abnormalities.  Recent Labwork: 06/03/2015: Magnesium 2.0 06/10/2015: Platelets 263 01/19/2016: Hemoglobin 12.9* 04/28/2016: BUN 12; Creat 0.98; Potassium 4.2; Sodium 140   Other Studies Reviewed Today:  Echocardiogram 01/01/2016: Study Conclusions  - Left ventricle: The cavity size was normal. Wall thickness was  increased in a pattern of moderate LVH. The estimated ejection  fraction was 55%. Probable hypokinesis of the basalinferior   myocardium. Doppler parameters are consistent with restrictive  physiology, indicative of decreased left ventricular diastolic  compliance and/or increased left atrial pressure. - Aortic valve: Mildly calcified annulus. Trileaflet; mildly  calcified leaflets. - Mitral valve: Calcified annulus. There was mild regurgitation. - Left atrium: The atrium was severely dilated. - Right atrium: Central venous pressure (est): 3 mm Hg. - Tricuspid valve: There was mild regurgitation. - Pulmonary arteries: Systolic pressure was moderately increased.  PA peak pressure: 53 mm Hg (S). - Pericardium, extracardiac: There was no pericardial effusion.  Impressions:  - Moderate LVH with LVEF approximately 55% and basal inferior  hypokinesis. Restrictive diastolic filling pattern is noted with  evidence of increased LV filling pressure. severe left atrial  enlargement. MAC with mild mitral regurgitation. Sclerotic aortic  valve without stenosis. Mild tricuspid regurgitation with  evidence of moderate pulmonary hypertension, PASP 53 mmHg.  Assessment and Plan:  1. Multivessel CAD status post CABG without active angina symptoms on medical therapy. Continue observation. LVEF 55% by most recent assessment.  2. Chronic diastolic heart failure, clinically stable at this point. Weight is down 3 pounds from the last visit. He continues on low-dose Lasix and potassium supplements.  3. Typical atrial flutter status post cardioversion in July 2016. He continues on Cardizem CD and Toprol-XL with Eliquis for stroke prophylaxis.  4. Recent reported anemia with initial GI workup negative. He has a prior history of iron deficiency anemia with gastric ulcer. It sounds like capsule endoscopy is being considered next. May need to stop anticoagulation long-term if this remains a recurring problem without clear correctable cause.  Current medicines were reviewed with the patient today.  Disposition: FU with me in  4 months.   Signed, Satira Sark, MD, [facility] 05/04/2016 8:13 AM    [facility] [place]. [address] [place], [place] [address] Phone: [phone]; Fax: [phone]

## 2016-05-04 NOTE — Patient Instructions (Signed)
Your physician wants you to follow-up in: 4 months with Dr McDowell You will receive a reminder letter in the mail two months in advance. If you don't receive a letter, please call our office to schedule the follow-up appointment.    Your physician recommends that you continue on your current medications as directed. Please refer to the Current Medication list given to you today.    If you need a refill on your cardiac medications before your next appointment, please call your pharmacy.     Thank you for choosing [facility] !        

## 2016-05-18 DIAGNOSIS — I2510 Atherosclerotic heart disease of native coronary artery without angina pectoris: Secondary | ICD-10-CM | POA: Diagnosis not present

## 2016-05-18 DIAGNOSIS — I10 Essential (primary) hypertension: Secondary | ICD-10-CM | POA: Diagnosis not present

## 2016-05-18 DIAGNOSIS — J449 Chronic obstructive pulmonary disease, unspecified: Secondary | ICD-10-CM | POA: Diagnosis not present

## 2016-05-18 DIAGNOSIS — I4891 Unspecified atrial fibrillation: Secondary | ICD-10-CM | POA: Diagnosis not present

## 2016-05-19 ENCOUNTER — Telehealth: Payer: Self-pay

## 2016-05-19 NOTE — Telephone Encounter (Signed)
LM that his 90 day supply of Eliquis is at front desk, to be signed out

## 2016-05-25 DIAGNOSIS — E1140 Type 2 diabetes mellitus with diabetic neuropathy, unspecified: Secondary | ICD-10-CM | POA: Diagnosis not present

## 2016-05-25 DIAGNOSIS — E1151 Type 2 diabetes mellitus with diabetic peripheral angiopathy without gangrene: Secondary | ICD-10-CM | POA: Diagnosis not present

## 2016-07-15 ENCOUNTER — Ambulatory Visit (HOSPITAL_COMMUNITY): Payer: Medicare Other | Attending: Pulmonary Disease | Admitting: Physical Therapy

## 2016-07-15 DIAGNOSIS — S51811D Laceration without foreign body of right forearm, subsequent encounter: Secondary | ICD-10-CM

## 2016-07-15 DIAGNOSIS — X58XXXD Exposure to other specified factors, subsequent encounter: Secondary | ICD-10-CM | POA: Insufficient documentation

## 2016-07-15 DIAGNOSIS — M79601 Pain in right arm: Secondary | ICD-10-CM | POA: Diagnosis not present

## 2016-07-15 NOTE — Therapy (Addendum)
[facility] [place], [place], [address] Phone: [phone]   Fax:  [phone]  Wound Care Evaluation  Patient Details  Name: John Sampson MRN: [id number] Date of Birth: May 13, 1936 Referring Provider: Dr. Sinda Du  Encounter Date: 07/15/2016  Date of eval :  07/15/2016 Visit number:   1/4 Authorization:  Medicare   Past Medical History:  Diagnosis Date  . Atrial flutter ([place])   . COPD (chronic obstructive pulmonary disease) ([place]) 3.12.14   2D Echo, EF 50-55%  . Coronary atherosclerosis of native coronary artery    Multivessel status post CABG  . Gastric ulcer    Small - nonbleeding  . GERD (gastroesophageal reflux disease)   . Headache(784.0)   . Iron deficiency anemia    Negative Givens capsule study   . Myocardial infarction ([place])   . PAD (peripheral artery disease) (H[facility])    Moderate bilateral SFA disease at angiography 01/2013  . Pituitary macroadenoma [facility])     Past Surgical History:  Procedure Laterality Date  . ABDOMINAL AORTAGRAM N/A 02/19/2013   Procedure: ABDOMINAL Maxcine Ham;  Surgeon: [name], MD;  Location: [facility] CATH LAB;  Service: Cardiovascular;  Laterality: N/A;  . CARDIOVERSION N/A 06/16/2015   Procedure: CARDIOVERSION;  Surgeon: Satira Sark, MD;  Location: [facility] ORS;  Service: Cardiovascular;  Laterality: N/A;  . COLONOSCOPY  08/18/2012   Procedure: COLONOSCOPY;  Surgeon: [name], MD;  [facility] ENDO [facility];  Service: Endoscopy;  Laterality: N/A;  1:25  . CORONARY ARTERY BYPASS GRAFT  2003   5 grafts - details not clear  . CRANIOTOMY  12/31/2011   Procedure: CRANIOTOMY HYPOPHYSECTOMY TRANSNASAL APPROACH;  Surgeon: [name], MD;  Location: [place] NEURO ORS;  Service: Neurosurgery;  Laterality: N/A;  Transphenoidal Hypophysectomy With Fat Graft Harvest from right abdomen   . ESOPHAGOGASTRODUODENOSCOPY  08/18/2012   Procedure: ESOPHAGOGASTRODUODENOSCOPY (EGD);  Surgeon: [name], MD;  [facility];  Service: Endoscopy;  Laterality: N/A;  . EYE SURGERY  2012  . GIVENS CAPSULE STUDY N/A 01/25/2013   Procedure: GIVENS CAPSULE STUDY;  Surgeon: [name], MD;  [facility];  Service: Endoscopy;  Laterality: N/A;  730  . LOWER EXTREMITY ANGIOGRAM N/A 02/19/2013   Procedure: LOWER EXTREMITY ANGIOGRAM;  Surgeon: [name], MD;  Location: [facility] CATH LAB;  Service: Cardiovascular;  Laterality: N/A;  . PERIPHERAL VASCULAR CATHETERIZATION N/A 01/19/2016   Procedure: Abdominal Aortogram;  Surgeon: Conrad Lancaster, MD;  Location: [place] CV LAB;  Service: Cardiovascular;  Laterality: N/A;  . PERIPHERAL VASCULAR CATHETERIZATION Bilateral 01/19/2016   Procedure: Lower Extremity Angiography;  Surgeon: Conrad Trumann, MD;  Location: [place] CV LAB;  Service: Cardiovascular;  Laterality: Bilateral;  . PV Angiogram  02/19/13   Indications: slow healing left calf ulcer  . Stress Myocardial Perfusion  12/08/2011   Indications: Abnormal EKG, Eval of prior GABG  . TEE WITHOUT CARDIOVERSION N/A 06/16/2015   Procedure: TRANSESOPHAGEAL ECHOCARDIOGRAM (TEE);  Surgeon: Satira Sark, MD;  Location: [facility] ORS;  Service: Cardiovascular;  Laterality: N/A;  . TONSILLECTOMY  Age 4    There were no vitals filed for this visit.        [facility] PT Assessment - 07/15/16 0001      Assessment   Medical Diagnosis Non-healing wound    Referring Provider Dr. Sinda Du   Onset Date/Surgical Date 07/05/16   Hand Dominance Right   Next MD Visit unknown  Prior Therapy not for this condition     Precautions   Precautions None     Restrictions   Weight Bearing Restrictions No     Balance Screen   Has the patient fallen in the past 6 months No   Has the patient had a decrease in activity level because of a fear of falling?  No   Is the patient reluctant to leave their home because of a fear of falling?  No     Prior Function   Vocation Retired      Associate Professor   Overall Cognitive Status Within Functional Limits for tasks assessed         Wound Therapy - 07/15/16 1128    Subjective Pt states that he injured his forearm on a storm door.  He states that it has not healed as expected therefore he sought medical attention and he has been referred to skilled physical therapy.    Patient and Family Stated Goals for his wound to heal    Date of Onset 07/05/16   Pain Assessment 0-10   Pain Score 2    Pain Type Acute pain   Pain Location Arm   Pain Orientation Right   Pain Descriptors / Indicators Discomfort   Pain Onset Other (Comment)  when brushed up against something    Patients Stated Pain Goal 0   Pain Intervention(s) Other (Comment)  compression    Evaluation and Treatment Procedures Explained to Patient/Family Yes   Evaluation and Treatment Procedures agreed to   Wound Properties Date First Assessed: 07/15/16 Time First Assessed: 0849 Wound Type: Laceration Location: Arm Location Orientation: Right Wound Description (Comments): dorsal aspect of forearm  Present on Admission: Yes   Dressing Type Compression wrap;Gauze (Comment);Impregnated gauze (bismuth)   Dressing Changed Changed   Dressing Status Clean;Old drainage   Dressing Change Frequency PRN   Site / Wound Assessment Clean;Red   % Wound base Red or Granulating 85%   % Wound base Yellow 15%   Peri-wound Assessment Edema   Wound Length (cm) 5.5 cm   Wound Width (cm) 6 cm   Wound Depth (cm) 0 cm   Margins Attached edges (approximated)   Drainage Amount Scant   Drainage Description Serous   Treatment Cleansed;Debridement (Selective)   Selective Debridement - Location Rt dorsal forearm   Selective Debridement - Tools Used Forceps   Selective Debridement - Tissue Removed slough   Wound Therapy - Clinical Statement Pt is a 80 yo male who has injured his Rt forearm in a storm door approximately 10 days ago.  He has been caring for the wound but it does not seem to want to  heal and he noted some swelling therefore he sought medical attention and was referred to skilled physical therapy.  Do to history of non healing wounds, CAD and PAD Mr. Billey will benefit from skilled PT to create a healing envioronment for his wound to close.    Wound Therapy - Functional Problem List non healing wound limiting use of RT UE    Factors Delaying/Impairing Wound Healing Infection - systemic/local;Multiple medical problems;Polypharmacy;Vascular compromise   Hydrotherapy Plan Debridement;Dressing change;Patient/family education   Wound Therapy - Frequency --  2 x a week for 2 weeks    Wound Therapy - Current Recommendations PT   Dressing  xeroform; arm sleeve and coban.  PT Short Term Goals - 07-31-16 1203      PT SHORT TERM GOAL #1   Title Pt to verbalize signs and symptoms of infection and the need to seek medical help immediately if apparent with any wounds.    Time 1   Period Weeks   Status New     PT SHORT TERM GOAL #2   Title Pt Rt forearm wound to be healed    Time 2   Period Weeks   Status New     PT SHORT TERM GOAL #3   Title Pt to verbalize the importance of moisturizing skin as we age to improve elasticity for increased protection against skin tears.    Time 2   Period Weeks               Patient will benefit from skilled therapeutic intervention in order to improve the following deficits and impairments:     Visit Diagnosis: Laceration of right forearm, subsequent encounter  Pain In Right Arm      G-Codes - 07-31-16 1210    Functional Assessment Tool Used clinical judgement:  wound size , appearance   Functional Limitation Other PT primary   Mobility: Walking and Moving Around Current Status [id number]) At least 1 percent but less than 20 percent impaired, limited or restricted   Mobility: Walking and Moving Around Goal Status [phone]) At least 1 percent but less than 20 percent impaired, limited  or restricted      Problem List Patient Active Problem List   Diagnosis Date Noted  . Venous stasis ulcers ([place]) 12/24/2015  . Chronic venous insufficiency 12/24/2015  . Atherosclerosis of extremity with ulceration ([place]) 12/24/2015  . CHF (congestive heart failure) ([place]) 12/24/2015  . Critical lower limb ischemia 12/24/2015  . Obesity 12/24/2015  . PAF (paroxysmal atrial fibrillation) ([place]) 12/24/2015  . PAD (peripheral artery disease) ([place]) 06/07/2014  . COPD (chronic obstructive pulmonary disease) ([place]) 01/23/2013  . Essential hypertension, benign 08/03/2012  . Mixed hyperlipidemia 08/03/2012  . Hx of CABG 08/03/2012    Rayetta Humphrey, PT CLT [phone] 2016/07/31, 12:12 PM  [facility] [address] [place], [place], [address] Phone: [phone]   Fax:  [phone]  Name: SOUMIL DOWTY MRN: [id number] Date of Birth: 05-Mar-1936

## 2016-07-20 ENCOUNTER — Encounter (HOSPITAL_COMMUNITY): Payer: Self-pay | Admitting: *Deleted

## 2016-07-20 ENCOUNTER — Inpatient Hospital Stay (HOSPITAL_COMMUNITY)
Admission: EM | Admit: 2016-07-20 | Discharge: 2016-07-25 | DRG: 603 | Disposition: A | Discharge status: Home / Self Care | Payer: Medicare Other | Attending: Pulmonary Disease | Admitting: Pulmonary Disease

## 2016-07-20 ENCOUNTER — Ambulatory Visit (HOSPITAL_COMMUNITY): Payer: Medicare Other | Admitting: Physical Therapy

## 2016-07-20 DIAGNOSIS — L02519 Cutaneous abscess of unspecified hand: Secondary | ICD-10-CM | POA: Diagnosis present

## 2016-07-20 DIAGNOSIS — Z7901 Long term (current) use of anticoagulants: Secondary | ICD-10-CM | POA: Diagnosis not present

## 2016-07-20 DIAGNOSIS — K219 Gastro-esophageal reflux disease without esophagitis: Secondary | ICD-10-CM | POA: Diagnosis present

## 2016-07-20 DIAGNOSIS — J040 Acute laryngitis: Secondary | ICD-10-CM | POA: Diagnosis present

## 2016-07-20 DIAGNOSIS — I739 Peripheral vascular disease, unspecified: Secondary | ICD-10-CM | POA: Diagnosis present

## 2016-07-20 DIAGNOSIS — L02511 Cutaneous abscess of right hand: Secondary | ICD-10-CM | POA: Diagnosis present

## 2016-07-20 DIAGNOSIS — I2510 Atherosclerotic heart disease of native coronary artery without angina pectoris: Secondary | ICD-10-CM | POA: Diagnosis present

## 2016-07-20 DIAGNOSIS — I10 Essential (primary) hypertension: Secondary | ICD-10-CM | POA: Diagnosis present

## 2016-07-20 DIAGNOSIS — I110 Hypertensive heart disease with heart failure: Secondary | ICD-10-CM | POA: Diagnosis present

## 2016-07-20 DIAGNOSIS — L03119 Cellulitis of unspecified part of limb: Secondary | ICD-10-CM | POA: Diagnosis not present

## 2016-07-20 DIAGNOSIS — I480 Paroxysmal atrial fibrillation: Secondary | ICD-10-CM | POA: Diagnosis present

## 2016-07-20 DIAGNOSIS — Z8711 Personal history of peptic ulcer disease: Secondary | ICD-10-CM | POA: Diagnosis not present

## 2016-07-20 DIAGNOSIS — Z8249 Family history of ischemic heart disease and other diseases of the circulatory system: Secondary | ICD-10-CM

## 2016-07-20 DIAGNOSIS — I252 Old myocardial infarction: Secondary | ICD-10-CM

## 2016-07-20 DIAGNOSIS — I5033 Acute on chronic diastolic (congestive) heart failure: Secondary | ICD-10-CM | POA: Diagnosis present

## 2016-07-20 DIAGNOSIS — R600 Localized edema: Secondary | ICD-10-CM | POA: Diagnosis not present

## 2016-07-20 DIAGNOSIS — L02413 Cutaneous abscess of right upper limb: Secondary | ICD-10-CM | POA: Diagnosis present

## 2016-07-20 DIAGNOSIS — Z87891 Personal history of nicotine dependence: Secondary | ICD-10-CM | POA: Diagnosis not present

## 2016-07-20 DIAGNOSIS — I5032 Chronic diastolic (congestive) heart failure: Secondary | ICD-10-CM | POA: Diagnosis present

## 2016-07-20 DIAGNOSIS — M7989 Other specified soft tissue disorders: Secondary | ICD-10-CM

## 2016-07-20 DIAGNOSIS — J449 Chronic obstructive pulmonary disease, unspecified: Secondary | ICD-10-CM | POA: Diagnosis present

## 2016-07-20 DIAGNOSIS — L03113 Cellulitis of right upper limb: Secondary | ICD-10-CM | POA: Diagnosis present

## 2016-07-20 DIAGNOSIS — Z951 Presence of aortocoronary bypass graft: Secondary | ICD-10-CM | POA: Diagnosis not present

## 2016-07-20 LAB — BASIC METABOLIC PANEL
ANION GAP: 9 (ref 5–15)
BUN: 14 mg/dL (ref 6–20)
CALCIUM: 9.3 mg/dL (ref 8.9–10.3)
CO2: 24 mmol/L (ref 22–32)
Chloride: 103 mmol/L (ref 101–111)
Creatinine, Ser: 0.9 mg/dL (ref 0.61–1.24)
Glucose, Bld: 99 mg/dL (ref 65–99)
Potassium: 3.8 mmol/L (ref 3.5–5.1)
SODIUM: 136 mmol/L (ref 135–145)

## 2016-07-20 LAB — CBC WITH DIFFERENTIAL/PLATELET
BASOS ABS: 0 10*3/uL (ref 0.0–0.1)
BASOS PCT: 0 %
EOS ABS: 0.3 10*3/uL (ref 0.0–0.7)
Eosinophils Relative: 2 %
HEMATOCRIT: 39.5 % (ref 39.0–52.0)
HEMOGLOBIN: 12.5 g/dL — AB (ref 13.0–17.0)
Lymphocytes Relative: 10 %
Lymphs Abs: 1.4 10*3/uL (ref 0.7–4.0)
MCH: 29.4 pg (ref 26.0–34.0)
MCHC: 31.6 g/dL (ref 30.0–36.0)
MCV: 92.9 fL (ref 78.0–100.0)
Monocytes Absolute: 1.4 10*3/uL — ABNORMAL HIGH (ref 0.1–1.0)
Monocytes Relative: 10 %
NEUTROS ABS: 10.7 10*3/uL — AB (ref 1.7–7.7)
NEUTROS PCT: 78 %
Platelets: 286 10*3/uL (ref 150–400)
RBC: 4.25 MIL/uL (ref 4.22–5.81)
RDW: 15.8 % — ABNORMAL HIGH (ref 11.5–15.5)
WBC: 13.8 10*3/uL — AB (ref 4.0–10.5)

## 2016-07-20 MED ORDER — FOLIC ACID 1 MG PO TABS
1.0000 mg | ORAL_TABLET | Freq: Every day | ORAL | Status: DC
  Administered 2016-07-20 – 2016-07-25 (×6): 1 mg via ORAL
  Filled 2016-07-20 (×6): qty 1

## 2016-07-20 MED ORDER — FUROSEMIDE 40 MG PO TABS
20.0000 mg | ORAL_TABLET | Freq: Every day | ORAL | Status: DC
  Administered 2016-07-20 – 2016-07-23 (×4): 20 mg via ORAL
  Filled 2016-07-20 (×4): qty 1

## 2016-07-20 MED ORDER — ATORVASTATIN CALCIUM 10 MG PO TABS
10.0000 mg | ORAL_TABLET | Freq: Every day | ORAL | Status: DC
  Administered 2016-07-21 – 2016-07-24 (×5): 10 mg via ORAL
  Filled 2016-07-20 (×4): qty 1

## 2016-07-20 MED ORDER — METOPROLOL SUCCINATE ER 50 MG PO TB24
50.0000 mg | ORAL_TABLET | Freq: Two times a day (BID) | ORAL | Status: DC
  Administered 2016-07-20 – 2016-07-25 (×11): 50 mg via ORAL
  Filled 2016-07-20 (×11): qty 1

## 2016-07-20 MED ORDER — APIXABAN 5 MG PO TABS
5.0000 mg | ORAL_TABLET | Freq: Two times a day (BID) | ORAL | Status: DC
Start: 2016-07-20 — End: 2016-07-21
  Administered 2016-07-20 (×2): 5 mg via ORAL
  Filled 2016-07-20 (×2): qty 1

## 2016-07-20 MED ORDER — SODIUM CHLORIDE 0.9 % IV SOLN: 250.0000 mL | INTRAVENOUS | Status: DC | PRN

## 2016-07-20 MED ORDER — LISINOPRIL 10 MG PO TABS
10.0000 mg | ORAL_TABLET | Freq: Every day | ORAL | Status: DC
  Administered 2016-07-20 – 2016-07-25 (×6): 10 mg via ORAL
  Filled 2016-07-20 (×6): qty 1

## 2016-07-20 MED ORDER — ONDANSETRON HCL 4 MG/2ML IJ SOLN
4.0000 mg | Freq: Four times a day (QID) | INTRAMUSCULAR | Status: DC | PRN
  Administered 2016-07-20: 4 mg via INTRAVENOUS
  Filled 2016-07-20: qty 2

## 2016-07-20 MED ORDER — HYDROCODONE-ACETAMINOPHEN 5-325 MG PO TABS
1.0000 | ORAL_TABLET | ORAL | Status: DC | PRN
  Administered 2016-07-20: 1 via ORAL
  Administered 2016-07-20 – 2016-07-24 (×5): 2 via ORAL
  Filled 2016-07-20 (×3): qty 2
  Filled 2016-07-20: qty 1
  Filled 2016-07-20 (×2): qty 2

## 2016-07-20 MED ORDER — ALBUTEROL SULFATE HFA 108 (90 BASE) MCG/ACT IN AERS: 2.0000 | INHALATION_SPRAY | Freq: Four times a day (QID) | RESPIRATORY_TRACT | Status: DC | PRN

## 2016-07-20 MED ORDER — VANCOMYCIN HCL IN DEXTROSE 1-5 GM/200ML-% IV SOLN
1000.0000 mg | Freq: Once | INTRAVENOUS | Status: AC
  Administered 2016-07-20: 1000 mg via INTRAVENOUS
  Filled 2016-07-20: qty 200

## 2016-07-20 MED ORDER — PIPERACILLIN-TAZOBACTAM 3.375 G IVPB 30 MIN
3.3750 g | Freq: Once | INTRAVENOUS | Status: AC
  Administered 2016-07-20: 3.375 g via INTRAVENOUS
  Filled 2016-07-20: qty 50

## 2016-07-20 MED ORDER — PIPERACILLIN-TAZOBACTAM 3.375 G IVPB
3.3750 g | Freq: Three times a day (TID) | INTRAVENOUS | Status: DC
  Administered 2016-07-20 – 2016-07-24 (×12): 3.375 g via INTRAVENOUS
  Filled 2016-07-20 (×12): qty 50

## 2016-07-20 MED ORDER — ACETAMINOPHEN 650 MG RE SUPP: 650.0000 mg | Freq: Four times a day (QID) | RECTAL | Status: DC | PRN

## 2016-07-20 MED ORDER — ONDANSETRON HCL 4 MG PO TABS: 4.0000 mg | ORAL_TABLET | Freq: Four times a day (QID) | ORAL | Status: DC | PRN

## 2016-07-20 MED ORDER — SIMVASTATIN 10 MG PO TABS
40.0000 mg | ORAL_TABLET | Freq: Every day | ORAL | Status: DC
  Administered 2016-07-20: 40 mg via ORAL
  Filled 2016-07-20: qty 4

## 2016-07-20 MED ORDER — VANCOMYCIN HCL IN DEXTROSE 1-5 GM/200ML-% IV SOLN
1000.0000 mg | Freq: Two times a day (BID) | INTRAVENOUS | Status: DC
  Administered 2016-07-20 – 2016-07-21 (×2): 1000 mg via INTRAVENOUS
  Filled 2016-07-20 (×2): qty 200

## 2016-07-20 MED ORDER — SODIUM CHLORIDE 0.9% FLUSH
3.0000 mL | INTRAVENOUS | Status: DC | PRN
Start: 2016-07-20 — End: 2016-07-25

## 2016-07-20 MED ORDER — DILTIAZEM HCL ER COATED BEADS 240 MG PO CP24
240.0000 mg | ORAL_CAPSULE | Freq: Every day | ORAL | Status: DC
  Administered 2016-07-20 – 2016-07-25 (×6): 240 mg via ORAL
  Filled 2016-07-20 (×6): qty 1

## 2016-07-20 MED ORDER — ACETAMINOPHEN 325 MG PO TABS
650.0000 mg | ORAL_TABLET | Freq: Four times a day (QID) | ORAL | Status: DC | PRN
  Administered 2016-07-21: 650 mg via ORAL
  Filled 2016-07-20: qty 2

## 2016-07-20 MED ORDER — SODIUM CHLORIDE 0.9% FLUSH
3.0000 mL | Freq: Two times a day (BID) | INTRAVENOUS | Status: DC
  Administered 2016-07-20 – 2016-07-23 (×6): 3 mL via INTRAVENOUS

## 2016-07-20 MED ORDER — PANTOPRAZOLE SODIUM 40 MG PO TBEC
40.0000 mg | DELAYED_RELEASE_TABLET | Freq: Every day | ORAL | Status: DC
  Administered 2016-07-20 – 2016-07-25 (×6): 40 mg via ORAL
  Filled 2016-07-20 (×6): qty 1

## 2016-07-20 MED ORDER — ALBUTEROL SULFATE (2.5 MG/3ML) 0.083% IN NEBU: 2.5000 mg | INHALATION_SOLUTION | Freq: Four times a day (QID) | RESPIRATORY_TRACT | Status: DC | PRN

## 2016-07-20 MED ORDER — NITROGLYCERIN 0.4 MG SL SUBL: 0.4000 mg | SUBLINGUAL_TABLET | SUBLINGUAL | Status: DC | PRN

## 2016-07-20 NOTE — Progress Notes (Addendum)
Pharmacy Antibiotic Note  John Sampson is a 80 y.o. male admitted on 07/20/2016 with cellulitis.  Pharmacy has been consulted for Vancomycin and Zosyn dosing.  Plan: Vancomycin 1gm  IV every 12 hours.  Goal trough 10-15 mcg/mL. Zosyn 3.375g IV q8h (4 hour infusion).  Monitor labs, micro and vitals.   Height: 5\' 9"  (175.3 cm) Weight: 220 lb (99.8 kg) IBW/kg (Calculated) : 70.7  Temp (24hrs), Avg:98.3 F (36.8 C), Min:98.2 F (36.8 C), Max:98.4 F (36.9 C)   Recent Labs Lab 07/20/16 0655  WBC 13.8*  CREATININE 0.90    Estimated Creatinine Clearance: 78.7 mL/min (by C-G formula based on SCr of 0.9 mg/dL).    No Known Allergies  Antimicrobials this admission: Vanc 8/22 >>  Zosyn 8/22 >>   Dose adjustments this admission: n/a   Microbiology results: n/a   Thank you for allowing pharmacy to be a part of this patient's care.  John Sampson 07/20/2016 10:49 AM  Addum:  John Sampson has increased today.  Will adjust vanc to 1500 mg IV q24 hours John Sampson, PharmD

## 2016-07-20 NOTE — ED Triage Notes (Signed)
Pt states he has a wound on his right hand that has gotten progressively worse during the night; right hand has redness and swelling and pt states it is painful

## 2016-07-20 NOTE — ED Notes (Addendum)
Floor unable  To take report at this time. Light dressing applied. nad

## 2016-07-20 NOTE — ED Provider Notes (Signed)
[facility] DEPT Provider Note   CSN: [id number] Arrival date & time: 07/20/16  0438  Time Seen 06:08   History   Chief Complaint Chief Complaint  Patient presents with  . Wound Check    HPI John Sampson is a 80 y.o. male.  HPI  patient reports he has been going to the wound center and has had one visit for an abrasion that was on the dorsum of his right forearm that happened when his screen door hit his arm when he was coming in carrying groceries. He states he is supposed to go twice a week and actually has an appointment for 9 AM this morning. He states that about 5 days ago he hit his right wrist but last night he started getting increasing swelling and redness of the area and pain. He denies any fever, chills, or nausea. He states he's currently not on any antibiotics. Pt is right handed. Patient is on Eliquis.  PCP Dr Luan Pulling Cardiology Dr Domenic Polite  Past Medical History:  Diagnosis Date  . Atrial flutter ([place])   . COPD (chronic obstructive pulmonary disease) ([place]) 3.12.14   2D Echo, EF 50-55%  . Coronary atherosclerosis of native coronary artery    Multivessel status post CABG  . Gastric ulcer    Small - nonbleeding  . GERD (gastroesophageal reflux disease)   . Headache(784.0)   . Iron deficiency anemia    Negative Givens capsule study   . Myocardial infarction ([place])   . PAD (peripheral artery disease) (H[facility])    Moderate bilateral SFA disease at angiography 01/2013  . Pituitary macroadenoma [facility])     Patient Active Problem List   Diagnosis Date Noted  . Venous stasis ulcers ([place]) 12/24/2015  . Chronic venous insufficiency 12/24/2015  . Atherosclerosis of extremity with ulceration ([place]) 12/24/2015  . CHF (congestive heart failure) ([place]) 12/24/2015  . Critical lower limb ischemia 12/24/2015  . Obesity 12/24/2015  . PAF (paroxysmal atrial fibrillation) ([place]) 12/24/2015  . PAD (peripheral artery disease) ([place]) 06/07/2014  . COPD (chronic obstructive pulmonary  disease) ([place]) 01/23/2013  . Essential hypertension, benign 08/03/2012  . Mixed hyperlipidemia 08/03/2012  . Hx of CABG 08/03/2012    Past Surgical History:  Procedure Laterality Date  . ABDOMINAL AORTAGRAM N/A 02/19/2013   Procedure: ABDOMINAL Maxcine Ham;  Surgeon: [name], MD;  [facility] LAB;  Service: Cardiovascular;  Laterality: N/A;  . CARDIOVERSION N/A 06/16/2015   Procedure: CARDIOVERSION;  Surgeon: Satira Sark, MD;  Location: [facility] ORS;  Service: Cardiovascular;  Laterality: N/A;  . COLONOSCOPY  08/18/2012   Procedure: COLONOSCOPY;  Surgeon: [name], MD;  Location: [facility] ENDO SUITE;  Service: Endoscopy;  Laterality: N/A;  1:25  . CORONARY ARTERY BYPASS GRAFT  2003   5 grafts - details not clear  . CRANIOTOMY  12/31/2011   Procedure: CRANIOTOMY HYPOPHYSECTOMY TRANSNASAL APPROACH;  Surgeon: [name], MD;  Location: [facility] NEURO ORS;  Service: Neurosurgery;  Laterality: N/A;  Transphenoidal Hypophysectomy With Fat Graft Harvest from right abdomen   . ESOPHAGOGASTRODUODENOSCOPY  08/18/2012   Procedure: ESOPHAGOGASTRODUODENOSCOPY (EGD);  Surgeon: [name], MD;  Location: [facility] ENDO SUITE;  Service: Endoscopy;  Laterality: N/A;  . EYE SURGERY  2012  . GIVENS CAPSULE STUDY N/A 01/25/2013   Procedure: GIVENS CAPSULE STUDY;  Surgeon: [name], MD;  Location: [facility] ENDO SUITE;  Service: Endoscopy;  Laterality: N/A;  730  . LOWER EXTREMITY ANGIOGRAM N/A 02/19/2013   Procedure: LOWER EXTREMITY ANGIOGRAM;  Surgeon:  [name], MD;  [facility];  Service: Cardiovascular;  Laterality: N/A;  . PERIPHERAL VASCULAR CATHETERIZATION N/A 01/19/2016   Procedure: Abdominal Aortogram;  Surgeon: Conrad Goodman, MD;  Location: [facility] CV LAB;  Service: Cardiovascular;  Laterality: N/A;  . PERIPHERAL VASCULAR CATHETERIZATION Bilateral 01/19/2016   Procedure: Lower Extremity Angiography;  Surgeon: Conrad Campbell, MD;  Location: [facility] CV LAB;  Service:  Cardiovascular;  Laterality: Bilateral;  . PV Angiogram  02/19/13   Indications: slow healing left calf ulcer  . Stress Myocardial Perfusion  12/08/2011   Indications: Abnormal EKG, Eval of prior GABG  . TEE WITHOUT CARDIOVERSION N/A 06/16/2015   Procedure: TRANSESOPHAGEAL ECHOCARDIOGRAM (TEE);  Surgeon: Satira Sark, MD;  Location: [facility] ORS;  Service: Cardiovascular;  Laterality: N/A;  . TONSILLECTOMY  Age 51       Home Medications    Prior to Admission medications   Medication Sig Start Date End Date Taking? Authorizing Provider  acetaminophen (TYLENOL) 325 MG tablet Take 650 mg by mouth daily as needed for headache.    Historical Provider, MD  albuterol (PROVENTIL HFA) 108 (90 BASE) MCG/ACT inhaler Inhale 2 puffs into the lungs every 6 (six) hours as needed for wheezing or shortness of breath. 11/15/14   Satira Sark, MD  apixaban (ELIQUIS) 5 MG TABS tablet Take 1 tablet (5 mg total) by mouth 2 (two) times daily. 12/24/15   Erlene Quan, PA-C  diltiazem (CARDIZEM CD) 240 MG 24 hr capsule Take 1 capsule (240 mg total) by mouth daily. 05/30/15   Lendon Colonel, NP  ferrous sulfate 325 (65 FE) MG tablet Take 1 tablet (325 mg total) by mouth 2 (two) times daily with a meal. 01/23/13   [name], MD  fish oil-omega-3 fatty acids 1000 MG capsule Take 1 g by mouth 2 (two) times daily.     Historical Provider, MD  folic acid (FOLVITE) 1 MG tablet Take 1 mg by mouth daily.     Historical Provider, MD  furosemide (LASIX) 20 MG tablet Take 1 tablet (20 mg total) by mouth daily. 03/01/16   Satira Sark, MD  lisinopril (PRINIVIL,ZESTRIL) 10 MG tablet Take 1 tablet (10 mg total) by mouth daily. 05/30/15   Lendon Colonel, NP  metoprolol succinate (TOPROL-XL) 50 MG 24 hr tablet Take 50 mg by mouth 2 (two) times daily. Take with or immediately following a meal.    Historical Provider, MD  nitroGLYCERIN (NITROSTAT) 0.4 MG SL tablet Place 1 tablet (0.4 mg total) under the tongue every 5  (five) minutes as needed for chest pain. 06/07/14   Satira Sark, MD  omeprazole (PRILOSEC) 20 MG capsule Take 20 mg by mouth 2 (two) times daily before a meal.    Historical Provider, MD  potassium chloride (MICRO-K) 10 MEQ CR capsule Take 10 mEq by mouth daily.     Historical Provider, MD  simvastatin (ZOCOR) 40 MG tablet Take 1 tablet (40 mg total) by mouth daily. 07/17/15   Satira Sark, MD    Family History Family History  Problem Relation Age of Onset  . CAD Mother   . Heart disease Father     before age 69    Social History Social History  Substance Use Topics  . Smoking status: Former Smoker    Packs/day: 3.00    Years: 45.00    Types: Cigarettes    Start date: 01/24/1955    Quit date: 01/23/2002  .  Smokeless tobacco: Never Used     Comment: Quit 11 yrs ago  . Alcohol use No  lives at home Lives alone   Allergies   Review of patient's allergies indicates no known allergies.   Review of Systems Review of Systems  All other systems reviewed and are negative.    Physical Exam Updated Vital Signs BP 190/87 (BP Location: Left Arm)   Pulse 72   Temp 98.4 F (36.9 C) (Oral)   Resp 18   Ht 5\' 9"  (1.753 m)   Wt 220 lb (99.8 kg)   SpO2 96%   BMI 32.49 kg/m   Vital signs normal except for hypertension   Physical Exam  Constitutional: He is oriented to person, place, and time. He appears well-developed and well-nourished.  Non-toxic appearance. He does not appear ill. No distress.  HENT:  Head: Normocephalic and atraumatic.  Right Ear: External ear normal.  Left Ear: External ear normal.  Nose: Nose normal. No mucosal edema or rhinorrhea.  Mouth/Throat: Mucous membranes are normal. No dental abscesses or uvula swelling.  Eyes: Conjunctivae and EOM are normal.  Neck: Normal range of motion and full passive range of motion without pain.  Cardiovascular: Normal rate.   Pulmonary/Chest: Effort normal. No respiratory distress. He has no rhonchi. He  exhibits no crepitus.  Abdominal: Normal appearance. There is no rebound.  Musculoskeletal: Normal range of motion. He exhibits edema. He exhibits no tenderness.  Moves all extremities well.   Neurological: He is alert and oriented to person, place, and time. He has normal strength. No cranial nerve deficit.  Skin: Skin is warm, dry and intact. No rash noted. There is erythema. No pallor.  Patient is noted to have diffuse swelling of his right hand with redness of the dorsum of his hand with a purplish discoloration on the radial aspect of his wrist. We unwrapped the wound on his distal forearm there are several open areas with some blood mixed with pus drainage from it. The redness extends up into this area which the patient states is new. There is also swelling up into this area too.  Psychiatric: He has a normal mood and affect. His speech is normal and behavior is normal. His mood appears not anxious.  Nursing note and vitals reviewed.        ED Treatments / Results  Labs (all labs ordered are listed, but only abnormal results are displayed) Results for orders placed or performed during the hospital encounter of [id number]  Basic metabolic panel  Result Value Ref Range   Sodium 136 135 - 145 mmol/L   Potassium 3.8 3.5 - 5.1 mmol/L   Chloride 103 101 - 111 mmol/L   CO2 24 22 - 32 mmol/L   Glucose, Bld 99 65 - 99 mg/dL   BUN 14 6 - 20 mg/dL   Creatinine, Ser 0.90 0.61 - 1.24 mg/dL   Calcium 9.3 8.9 - 10.3 mg/dL   GFR calc non Af Amer >60 >60 mL/min   GFR calc Af Amer >60 >60 mL/min   Anion gap 9 5 - 15  CBC with Differential  Result Value Ref Range   WBC 13.8 (H) 4.0 - 10.5 K/uL   RBC 4.25 4.22 - 5.81 MIL/uL   Hemoglobin 12.5 (L) 13.0 - 17.0 g/dL   HCT 39.5 39.0 - 52.0 %   MCV 92.9 78.0 - 100.0 fL   MCH 29.4 26.0 - 34.0 pg   MCHC 31.6 30.0 - 36.0 g/dL   RDW 15.8 (  H) 11.5 - 15.5 %   Platelets 286 150 - 400 K/uL   Neutrophils Relative % 78 %   Neutro Abs 10.7 (H) 1.7 - 7.7  K/uL   Lymphocytes Relative 10 %   Lymphs Abs 1.4 0.7 - 4.0 K/uL   Monocytes Relative 10 %   Monocytes Absolute 1.4 (H) 0.1 - 1.0 K/uL   Eosinophils Relative 2 %   Eosinophils Absolute 0.3 0.0 - 0.7 K/uL   Basophils Relative 0 %   Basophils Absolute 0.0 0.0 - 0.1 K/uL   Laboratory interpretation all normal except leukocytosis    EKG  EKG Interpretation None       Radiology No results found.  Procedures Procedures (including critical care time)  Medications Ordered in ED Medications  vancomycin (VANCOCIN) IVPB 1000 mg/200 mL premix (1,000 mg Intravenous New Bag/Given 07/20/16 0717)  piperacillin-tazobactam (ZOSYN) IVPB 3.375 g (0 g Intravenous Stopped 07/20/16 0708)     Initial Impression / Assessment and Plan / ED Course  I have reviewed the triage vital signs and the nursing notes.  Pertinent labs & imaging results that were available during my care of the patient were reviewed by me and considered in my medical decision making (see chart for details).  Clinical Course   Patient had IV inserted and received IV antibiotics. He states he does not want to stay in the hospital.  Recheck at 6:45 AM patient is getting his antibiotics. He states he has thought about it and he is now willing to be admitted. I will talk to the hospitalist about admission.  8:16 AM Dr. Caryn Section, hospitalist, states to admit to telemetry under Dr. Luan Pulling as attending. Holding orders were written.  Final Clinical Impressions(s) / ED Diagnoses   Final diagnoses:  Cellulitis of hand, right  Cellulitis of forearm, right    Plan admission  Rolland Porter, MD, Barbette Or, MD 07/20/16 [phone]

## 2016-07-20 NOTE — H&P (Signed)
NAMEXAVYER, SULEWSKI NO.:  [id number]  MEDICAL RECORD NO.:  [id number]  LOCATION:  [id number]                          FACILITY:  [facility]  PHYSICIAN:  Kyli Sorter L. Luan Pulling, M.D.DATE OF BIRTH:  14-Mar-1936  DATE OF ADMISSION:  07/20/2016 DATE OF DISCHARGE:  LH                             HISTORY & PHYSICAL   REASON FOR ADMISSION:  Cellulitis of right arm.  HISTORY:  Mr. Bucceri is a 80 year old, who has multiple medical problems, and who had been in his usual state of fairly poor health, but hit his right arm when a screen door hit his arm.  He was carrying groceries. He has been going to the wound Center, because he has had significant problem with that and he has had a lot of bleeding.  He is on Eliquis for anticoagulation.  Then about 5 days ago, he hit his right wrist and then last night, he has noted increased swelling and redness of the wrist area.  Pain in his arm.  He has not had any definite fever.  He is not on any antibiotics.  As mentioned, he is on Eliquis for anticoagulation.  PAST MEDICAL HISTORY:  Positive for multiple problems including atrial flutter, COPD, coronary arterial disease, status post bypass grafting, history of gastric ulcer, reflux, iron-deficiency anemia, previous myocardial infarction, peripheral arterial disease, and a pituitary adenoma.  PAST SURGICAL HISTORY:  He has had abdominal aortogram, cardioversion, colonoscopy, coronary artery bypass grafting, craniotomy, EGD, eye surgery, Givens capsule study, arteriograms of the lower extremities, and tonsillectomy.  FAMILY HISTORY:  Both his mother and his father had coronary disease.  SOCIAL HISTORY:  He stopped smoking about 15 years ago, and has a greater than 100 pack year smoking history.  He does not use any alcohol or illicit drugs.  ALLERGIES:  He has no known drug allergies.  MEDICATIONS:  He has been on Tylenol as needed; albuterol inhaler as a rescue inhaler, 2 puffs q.i.d. as  needed; Eliquis 5 mg b.i.d.; diltiazem 240 mg capsule daily; Colace 100 mg at bedtime; fish oil 1, three times a day; folic acid 1 mg daily; Lasix 20 mg daily; lisinopril 10 mg daily; metoprolol XL 50, one b.i.d.; nitroglycerin as needed; omeprazole 20 mg daily; potassium chloride 20 mEq daily; simvastatin 40 mg daily; and iron 325 mg daily.  As mentioned, he has not had any fever or chills.  He has not had nausea or vomiting.  He does not have red streaks going up his arm.  He is not short of breath.  No chest pain.  No abdominal pain.  PHYSICAL EXAMINATION:  VITAL SIGNS:  His vitals are as recorded.  His blood pressure was up somewhat on admission. HEENT:  Pupils are reactive.  Nose and throat are clear.  Mucous membranes are moist. NECK:  Supple without masses. CHEST:  Clear. HEART:  Regular now. ABDOMEN:  Soft without masses. EXTREMITIES:  He has no edema of his legs.  He has significant edema of the hypothenar eminence on the right hand.  Has several areas of laceration and abrasion on his right forearm. CENTRAL NERVOUS SYSTEM:  Grossly intact.  LABORATORY WORK:  Shows electrolytes  are normal.  White count 13,800, hemoglobin 12.5, platelets 286.  ASSESSMENT:  He has cellulitis, and I am going to go ahead and start him on IV antibiotics.  Continue with his other treatments.     Haven Foss L. Luan Pulling, M.D.     ELH/MEDQ  D:  07/20/2016  T:  07/20/2016  Job:  [id number]

## 2016-07-21 ENCOUNTER — Inpatient Hospital Stay (HOSPITAL_COMMUNITY): Payer: Medicare Other

## 2016-07-21 LAB — BASIC METABOLIC PANEL WITH GFR
Anion gap: 6 (ref 5–15)
BUN: 25 mg/dL — ABNORMAL HIGH (ref 6–20)
CO2: 27 mmol/L (ref 22–32)
Calcium: 9.1 mg/dL (ref 8.9–10.3)
Chloride: 103 mmol/L (ref 101–111)
Creatinine, Ser: 1.52 mg/dL — ABNORMAL HIGH (ref 0.61–1.24)
GFR calc Af Amer: 49 mL/min — ABNORMAL LOW
GFR calc non Af Amer: 42 mL/min — ABNORMAL LOW
Glucose, Bld: 115 mg/dL — ABNORMAL HIGH (ref 65–99)
Potassium: 4.7 mmol/L (ref 3.5–5.1)
Sodium: 136 mmol/L (ref 135–145)

## 2016-07-21 LAB — CBC
HEMATOCRIT: 37.6 % — AB (ref 39.0–52.0)
Hemoglobin: 11.6 g/dL — ABNORMAL LOW (ref 13.0–17.0)
MCH: 29.4 pg (ref 26.0–34.0)
MCHC: 30.9 g/dL (ref 30.0–36.0)
MCV: 95.4 fL (ref 78.0–100.0)
PLATELETS: 278 10*3/uL (ref 150–400)
RBC: 3.94 MIL/uL — AB (ref 4.22–5.81)
RDW: 15.8 % — AB (ref 11.5–15.5)
WBC: 13.2 10*3/uL — AB (ref 4.0–10.5)

## 2016-07-21 MED ORDER — VANCOMYCIN HCL 10 G IV SOLR
1500.0000 mg | Freq: Two times a day (BID) | INTRAVENOUS | Status: DC
  Administered 2016-07-22 – 2016-07-23 (×3): 1500 mg via INTRAVENOUS
  Filled 2016-07-21 (×9): qty 1500

## 2016-07-21 MED ORDER — PHENOL 1.4 % MT LIQD
1.0000 | OROMUCOSAL | Status: DC | PRN
  Administered 2016-07-21: 1 via OROMUCOSAL
  Filled 2016-07-21: qty 177

## 2016-07-21 NOTE — Progress Notes (Signed)
[facility] was notified of pt's bp. BP was reassessed and was 109/40. Pt denies feeling unwell and does not show any signs of distress.

## 2016-07-21 NOTE — Progress Notes (Signed)
Subjective: He says he feels okay. He still has swelling of his right hand. He has laryngitis. His breathing is okay  Objective: Vital signs in last 24 hours: Temp:  [98 F (36.7 C)-98.6 F (37 C)] 98 F (36.7 C) (08/23 0500) Pulse Rate:  [62-71] 63 (08/23 0500) Resp:  [18-19] 18 (08/23 0500) BP: (95-176)/(36-60) 121/40 (08/23 0500) SpO2:  [96 %-98 %] 98 % (08/23 0500) Weight:  [99.8 kg (220 lb)] 99.8 kg (220 lb) (08/22 1039) Weight change: -0 kg (-0 oz) Last BM Date: 07/18/16  Intake/Output from previous day: 08/22 0701 - 08/23 0700 In: 743 [P.O.:240; I.V.:3; IV Piggyback:500] Out: -   PHYSICAL EXAM General appearance: alert, cooperative and no distress Resp: clear to auscultation bilaterally Cardio: regular rate and rhythm, S1, S2 normal, no murmur, click, rub or gallop GI: soft, non-tender; bowel sounds normal; no masses,  no organomegaly Extremities: He still has swelling of his right arm. He has swelling of the hypo-thenar eminence of his thumb on the right and it looks a little bit more like this may be a hematoma.  Lab Results:  Results for orders placed or performed during the hospital encounter of 07/20/16 (from the past 48 hour(s))  Basic metabolic panel     Status: None   Collection Time: 07/20/16  6:55 AM  Result Value Ref Range   Sodium 136 135 - 145 mmol/L   Potassium 3.8 3.5 - 5.1 mmol/L   Chloride 103 101 - 111 mmol/L   CO2 24 22 - 32 mmol/L   Glucose, Bld 99 65 - 99 mg/dL   BUN 14 6 - 20 mg/dL   Creatinine, Ser 0.90 0.61 - 1.24 mg/dL   Calcium 9.3 8.9 - 10.3 mg/dL   GFR calc non Af Amer >60 >60 mL/min   GFR calc Af Amer >60 >60 mL/min    Comment: (NOTE) The eGFR has been calculated using the CKD EPI equation. This calculation has not been validated in all clinical situations. eGFR's persistently <60 mL/min signify possible Chronic Kidney Disease.    Anion gap 9 5 - 15  CBC with Differential     Status: Abnormal   Collection Time: 07/20/16  6:55 AM   Result Value Ref Range   WBC 13.8 (H) 4.0 - 10.5 K/uL   RBC 4.25 4.22 - 5.81 MIL/uL   Hemoglobin 12.5 (L) 13.0 - 17.0 g/dL   HCT 39.5 39.0 - 52.0 %   MCV 92.9 78.0 - 100.0 fL   MCH 29.4 26.0 - 34.0 pg   MCHC 31.6 30.0 - 36.0 g/dL   RDW 15.8 (H) 11.5 - 15.5 %   Platelets 286 150 - 400 K/uL   Neutrophils Relative % 78 %   Neutro Abs 10.7 (H) 1.7 - 7.7 K/uL   Lymphocytes Relative 10 %   Lymphs Abs 1.4 0.7 - 4.0 K/uL   Monocytes Relative 10 %   Monocytes Absolute 1.4 (H) 0.1 - 1.0 K/uL   Eosinophils Relative 2 %   Eosinophils Absolute 0.3 0.0 - 0.7 K/uL   Basophils Relative 0 %   Basophils Absolute 0.0 0.0 - 0.1 K/uL  Basic metabolic panel     Status: Abnormal   Collection Time: 07/21/16  5:37 AM  Result Value Ref Range   Sodium 136 135 - 145 mmol/L   Potassium 4.7 3.5 - 5.1 mmol/L    Comment: DELTA CHECK NOTED   Chloride 103 101 - 111 mmol/L   CO2 27 22 - 32 mmol/L  Glucose, Bld 115 (H) 65 - 99 mg/dL   BUN 25 (H) 6 - 20 mg/dL   Creatinine, Ser 1.52 (H) 0.61 - 1.24 mg/dL   Calcium 9.1 8.9 - 10.3 mg/dL   GFR calc non Af Amer 42 (L) >60 mL/min   GFR calc Af Amer 49 (L) >60 mL/min    Comment: (NOTE) The eGFR has been calculated using the CKD EPI equation. This calculation has not been validated in all clinical situations. eGFR's persistently <60 mL/min signify possible Chronic Kidney Disease.    Anion gap 6 5 - 15  CBC     Status: Abnormal   Collection Time: 07/21/16  5:37 AM  Result Value Ref Range   WBC 13.2 (H) 4.0 - 10.5 K/uL   RBC 3.94 (L) 4.22 - 5.81 MIL/uL   Hemoglobin 11.6 (L) 13.0 - 17.0 g/dL   HCT 37.6 (L) 39.0 - 52.0 %   MCV 95.4 78.0 - 100.0 fL   MCH 29.4 26.0 - 34.0 pg   MCHC 30.9 30.0 - 36.0 g/dL   RDW 15.8 (H) 11.5 - 15.5 %   Platelets 278 150 - 400 K/uL    ABGS No results for input(s): PHART, PO2ART, TCO2, HCO3 in the last 72 hours.  Invalid input(s): PCO2 CULTURES No results found for this or any previous visit (from the past 240  hour(s)). Studies/Results: No results found.  Medications:  Prior to Admission:  Prescriptions Prior to Admission  Medication Sig Dispense Refill Last Dose  . acetaminophen (TYLENOL) 325 MG tablet Take 650 mg by mouth daily as needed for headache.   Past Week at Unknown time  . albuterol (PROVENTIL HFA) 108 (90 BASE) MCG/ACT inhaler Inhale 2 puffs into the lungs every 6 (six) hours as needed for wheezing or shortness of breath. 1 Inhaler 3 07/20/2016 at Unknown time  . apixaban (ELIQUIS) 5 MG TABS tablet Take 1 tablet (5 mg total) by mouth 2 (two) times daily. 60 tablet 3 07/19/2016 at 0930  . diltiazem (CARDIZEM CD) 240 MG 24 hr capsule Take 1 capsule (240 mg total) by mouth daily. 90 capsule 3 07/19/2016 at Unknown time  . docusate sodium (COLACE) 100 MG capsule Take 200 mg by mouth at bedtime.   07/19/2016 at Unknown time  . fish oil-omega-3 fatty acids 1000 MG capsule Take 1 g by mouth 2 (two) times daily.    07/19/2016 at Unknown time  . folic acid (FOLVITE) 1 MG tablet Take 1 mg by mouth daily.    07/19/2016 at Unknown time  . furosemide (LASIX) 20 MG tablet Take 1 tablet (20 mg total) by mouth daily. 90 tablet 3 unknown  . lisinopril (PRINIVIL,ZESTRIL) 10 MG tablet Take 1 tablet (10 mg total) by mouth daily. 90 tablet 3 07/19/2016 at Unknown time  . metoprolol succinate (TOPROL-XL) 50 MG 24 hr tablet Take 50 mg by mouth 2 (two) times daily. Take with or immediately following a meal.   07/19/2016 at 0930  . nitroGLYCERIN (NITROSTAT) 0.4 MG SL tablet Place 1 tablet (0.4 mg total) under the tongue every 5 (five) minutes as needed for chest pain. 30 tablet 9 unknown  . omeprazole (PRILOSEC) 20 MG capsule Take 20 mg by mouth 2 (two) times daily before a meal.   07/19/2016 at Unknown time  . potassium chloride SA (K-DUR,KLOR-CON) 20 MEQ tablet Take 20 mEq by mouth daily.   07/19/2016 at Unknown time  . simvastatin (ZOCOR) 40 MG tablet Take 1 tablet (40 mg total) by mouth daily. (  Patient taking  differently: Take 20 mg by mouth at bedtime. ) 30 tablet 6 07/19/2016 at Unknown time  . ferrous sulfate 325 (65 FE) MG tablet Take 1 tablet (325 mg total) by mouth 2 (two) times daily with a meal. (Patient not taking: Reported on 07/20/2016)   Completed Course   Scheduled: . apixaban  5 mg Oral BID  . atorvastatin  10 mg Oral q1800  . diltiazem  240 mg Oral Daily  . folic acid  1 mg Oral Daily  . furosemide  20 mg Oral Daily  . lisinopril  10 mg Oral Daily  . metoprolol succinate  50 mg Oral BID  . pantoprazole  40 mg Oral Daily  . piperacillin-tazobactam (ZOSYN)  IV  3.375 g Intravenous Q8H  . sodium chloride flush  3 mL Intravenous Q12H  . vancomycin  1,000 mg Intravenous Q12H   Continuous:  XFF:KVQOHC chloride, acetaminophen **OR** acetaminophen, albuterol, HYDROcodone-acetaminophen, nitroGLYCERIN, ondansetron **OR** ondansetron (ZOFRAN) IV, sodium chloride flush  Assesment: He has cellulitis and abscess of the right arm and hand. He had an injury to his arm and hand and he is on chronic anticoagulation so some of this may be a blood collection. I discussed the situation with Dr. register in radiology and he suggests we start with plain films of the hand and then MRI if the plain films don't give [place] a diagnosis. I discussed holding anticoagulation with Mr. Lacinda Axon and I think I'll do that at least until we get the images done Principal Problem:   Cellulitis and abscess of hand Active Problems:   Essential hypertension, benign   Hx of CABG   COPD (chronic obstructive pulmonary disease) (HCC)   PAF (paroxysmal atrial fibrillation) (HCC)   Cellulitis of right arm    Plan: Hold anticoagulation for now. Plain films of the hand. Once the results of that is known well schedule MRI    LOS: 1 day   Krystin Keeven L 07/21/2016, 8:19 AM

## 2016-07-21 NOTE — Care Management Important Message (Signed)
Important Message  Patient Details  Name: John Sampson MRN: [id number] Date of Birth: [birth year]   Medicare Important Message Given:  Yes    Regan Llorente, Chauncey Reading, RN 07/21/2016, 12:12 PM

## 2016-07-21 NOTE — Care Management Note (Signed)
Case Management Note  Patient Details  Name: John Sampson MRN: [id number] Date of Birth: [birth year]  Subjective/Objective: Patient is from home alone, ind with ADL's. Adm with cellulitis. He has a PCP and insurance, reports no issues.                     Action/Plan Anticipate DC home with self care.    Expected Discharge Date:                  Expected Discharge Plan:  Home/Self Care  In-House Referral:  NA  Discharge planning Services  CM Consult  Post Acute Care Choice:  NA Choice offered to:  NA  DME Arranged:    DME Agency:     HH Arranged:    HH Agency:     Status of Service:  Completed, signed off  If discussed at [employer] of Stay Meetings, dates discussed:    Additional Comments:  Taquanna Borras, Chauncey Reading, RN 07/21/2016, 12:05 PM

## 2016-07-22 ENCOUNTER — Ambulatory Visit (HOSPITAL_COMMUNITY): Payer: Medicare Other | Admitting: Physical Therapy

## 2016-07-22 ENCOUNTER — Telehealth (HOSPITAL_COMMUNITY): Payer: Self-pay | Admitting: Physical Therapy

## 2016-07-22 LAB — BASIC METABOLIC PANEL
ANION GAP: 9 (ref 5–15)
BUN: 28 mg/dL — ABNORMAL HIGH (ref 6–20)
CHLORIDE: 101 mmol/L (ref 101–111)
CO2: 27 mmol/L (ref 22–32)
Calcium: 8.8 mg/dL — ABNORMAL LOW (ref 8.9–10.3)
Creatinine, Ser: 1.44 mg/dL — ABNORMAL HIGH (ref 0.61–1.24)
GFR calc non Af Amer: 45 mL/min — ABNORMAL LOW (ref >60)
GFR, EST AFRICAN AMERICAN: 52 mL/min — AB (ref >60)
Glucose, Bld: 101 mg/dL — ABNORMAL HIGH (ref 65–99)
POTASSIUM: 4.3 mmol/L (ref 3.5–5.1)
SODIUM: 137 mmol/L (ref 135–145)

## 2016-07-22 MED ORDER — POLYETHYLENE GLYCOL 3350 17 G PO PACK
17.0000 g | PACK | Freq: Every day | ORAL | Status: DC | PRN
  Administered 2016-07-22: 17 g via ORAL
  Filled 2016-07-22: qty 1

## 2016-07-22 NOTE — Progress Notes (Signed)
Subjective: He says he feels okay. He is still having significant issues with his right arm. He had MRI done yesterday but I don't have report yet. He has lost IV access  Objective: Vital signs in last 24 hours: Temp:  [98.3 F (36.8 C)-98.5 F (36.9 C)] 98.5 F (36.9 C) (08/24 0627) Pulse Rate:  [43-88] 88 (08/24 0627) Resp:  [18] 18 (08/23 1435) BP: (104-150)/(43-51) 120/51 (08/24 0627) SpO2:  [91 %-97 %] 97 % (08/24 0627) Weight change:  Last BM Date: 07/18/16  Intake/Output from previous day: 08/23 0701 - 08/24 0700 In: 790 [P.O.:240; IV Piggyback:550] Out: -   PHYSICAL EXAM General appearance: alert, cooperative and mild distress Resp: clear to auscultation bilaterally Cardio: regular rate and rhythm, S1, S2 normal, no murmur, click, rub or gallop GI: soft, non-tender; bowel sounds normal; no masses,  no organomegaly Extremities: He still has extensive erythema and warmth from the mid forearm down to below his right thumb  Lab Results:  Results for orders placed or performed during the hospital encounter of 07/20/16 (from the past 48 hour(s))  Basic metabolic panel     Status: Abnormal   Collection Time: 07/21/16  5:37 AM  Result Value Ref Range   Sodium 136 135 - 145 mmol/L   Potassium 4.7 3.5 - 5.1 mmol/L    Comment: DELTA CHECK NOTED   Chloride 103 101 - 111 mmol/L   CO2 27 22 - 32 mmol/L   Glucose, Bld 115 (H) 65 - 99 mg/dL   BUN 25 (H) 6 - 20 mg/dL   Creatinine, Ser 1.52 (H) 0.61 - 1.24 mg/dL   Calcium 9.1 8.9 - 10.3 mg/dL   GFR calc non Af Amer 42 (L) >60 mL/min   GFR calc Af Amer 49 (L) >60 mL/min    Comment: (NOTE) The eGFR has been calculated using the CKD EPI equation. This calculation has not been validated in all clinical situations. eGFR's persistently <60 mL/min signify possible Chronic Kidney Disease.    Anion gap 6 5 - 15  CBC     Status: Abnormal   Collection Time: 07/21/16  5:37 AM  Result Value Ref Range   WBC 13.2 (H) 4.0 - 10.5 K/uL    RBC 3.94 (L) 4.22 - 5.81 MIL/uL   Hemoglobin 11.6 (L) 13.0 - 17.0 g/dL   HCT 37.6 (L) 39.0 - 52.0 %   MCV 95.4 78.0 - 100.0 fL   MCH 29.4 26.0 - 34.0 pg   MCHC 30.9 30.0 - 36.0 g/dL   RDW 15.8 (H) 11.5 - 15.5 %   Platelets 278 150 - 400 K/uL  Basic metabolic panel     Status: Abnormal   Collection Time: 07/22/16  5:32 AM  Result Value Ref Range   Sodium 137 135 - 145 mmol/L   Potassium 4.3 3.5 - 5.1 mmol/L   Chloride 101 101 - 111 mmol/L   CO2 27 22 - 32 mmol/L   Glucose, Bld 101 (H) 65 - 99 mg/dL   BUN 28 (H) 6 - 20 mg/dL   Creatinine, Ser 1.44 (H) 0.61 - 1.24 mg/dL   Calcium 8.8 (L) 8.9 - 10.3 mg/dL   GFR calc non Af Amer 45 (L) >60 mL/min   GFR calc Af Amer 52 (L) >60 mL/min    Comment: (NOTE) The eGFR has been calculated using the CKD EPI equation. This calculation has not been validated in all clinical situations. eGFR's persistently <60 mL/min signify possible Chronic Kidney Disease.    Anion  gap 9 5 - 15    ABGS No results for input(s): PHART, PO2ART, TCO2, HCO3 in the last 72 hours.  Invalid input(s): PCO2 CULTURES No results found for this or any previous visit (from the past 240 hour(s)). Studies/Results: Dg Hand Complete Right  Result Date: 07/21/2016 CLINICAL DATA:  Hand pain and swelling. EXAM: RIGHT HAND - COMPLETE 3+ VIEW COMPARISON:  No recent prior. FINDINGS: Diffuse soft tissue swelling. No radiopaque foreign body. No acute bony or joint abnormality identified. No evidence of fracture or dislocation. Diffuse degenerative change. Old ulnar styloid fracture noted. Small sclerotic density noted the distal radial metaphysis most likely bone island or infarct. If symptoms persist MRI can be obtained. IMPRESSION: 1. Diffuse soft tissue swelling.  No radiopaque foreign body. 2. No acute bony abnormality. Diffuse degenerative change. Old ulnar styloid fracture. Tiny sclerotic focus in the distal right radial metaphysis, most likely bone island or old infarct .  Electronically Signed   By: [place]   On: 07/21/2016 10:00    Medications:  Prior to Admission:  Prescriptions Prior to Admission  Medication Sig Dispense Refill Last Dose  . acetaminophen (TYLENOL) 325 MG tablet Take 650 mg by mouth daily as needed for headache.   Past Week at Unknown time  . albuterol (PROVENTIL HFA) 108 (90 BASE) MCG/ACT inhaler Inhale 2 puffs into the lungs every 6 (six) hours as needed for wheezing or shortness of breath. 1 Inhaler 3 07/20/2016 at Unknown time  . apixaban (ELIQUIS) 5 MG TABS tablet Take 1 tablet (5 mg total) by mouth 2 (two) times daily. 60 tablet 3 07/19/2016 at 0930  . diltiazem (CARDIZEM CD) 240 MG 24 hr capsule Take 1 capsule (240 mg total) by mouth daily. 90 capsule 3 07/19/2016 at Unknown time  . docusate sodium (COLACE) 100 MG capsule Take 200 mg by mouth at bedtime.   07/19/2016 at Unknown time  . fish oil-omega-3 fatty acids 1000 MG capsule Take 1 g by mouth 2 (two) times daily.    07/19/2016 at Unknown time  . folic acid (FOLVITE) 1 MG tablet Take 1 mg by mouth daily.    07/19/2016 at Unknown time  . furosemide (LASIX) 20 MG tablet Take 1 tablet (20 mg total) by mouth daily. 90 tablet 3 unknown  . lisinopril (PRINIVIL,ZESTRIL) 10 MG tablet Take 1 tablet (10 mg total) by mouth daily. 90 tablet 3 07/19/2016 at Unknown time  . metoprolol succinate (TOPROL-XL) 50 MG 24 hr tablet Take 50 mg by mouth 2 (two) times daily. Take with or immediately following a meal.   07/19/2016 at 0930  . nitroGLYCERIN (NITROSTAT) 0.4 MG SL tablet Place 1 tablet (0.4 mg total) under the tongue every 5 (five) minutes as needed for chest pain. 30 tablet 9 unknown  . omeprazole (PRILOSEC) 20 MG capsule Take 20 mg by mouth 2 (two) times daily before a meal.   07/19/2016 at Unknown time  . potassium chloride SA (K-DUR,KLOR-CON) 20 MEQ tablet Take 20 mEq by mouth daily.   07/19/2016 at Unknown time  . simvastatin (ZOCOR) 40 MG tablet Take 1 tablet (40 mg total) by mouth daily.  (Patient taking differently: Take 20 mg by mouth at bedtime. ) 30 tablet 6 07/19/2016 at Unknown time  . ferrous sulfate 325 (65 FE) MG tablet Take 1 tablet (325 mg total) by mouth 2 (two) times daily with a meal. (Patient not taking: Reported on 07/20/2016)   Completed Course   Scheduled: . atorvastatin  10 mg Oral  q1800  . diltiazem  240 mg Oral Daily  . folic acid  1 mg Oral Daily  . furosemide  20 mg Oral Daily  . lisinopril  10 mg Oral Daily  . metoprolol succinate  50 mg Oral BID  . pantoprazole  40 mg Oral Daily  . piperacillin-tazobactam (ZOSYN)  IV  3.375 g Intravenous Q8H  . sodium chloride flush  3 mL Intravenous Q12H  . vancomycin  1,500 mg Intravenous Q12H   Continuous:  KSH:NGITJL chloride, acetaminophen **OR** acetaminophen, albuterol, HYDROcodone-acetaminophen, nitroGLYCERIN, ondansetron **OR** ondansetron (ZOFRAN) IV, phenol, sodium chloride flush  Assesment: He has cellulitis of his arm and hand. He may have some bleeding. The area in his thumb looks slightly better and he is off anticoagulation for the moment at least until I get the MRI report. He has history of coronary artery bypass grafting, COPD, peripheral arterial disease and paroxysmal atrial fibrillation. These appear to be stable. Principal Problem:   Cellulitis and abscess of hand Active Problems:   Essential hypertension, benign   Hx of CABG   COPD (chronic obstructive pulmonary disease) (HCC)   PAF (paroxysmal atrial fibrillation) (HCC)   Cellulitis of right arm    Plan: If we can get venous access he may need PICC line. Await MRI report. Continue treatments.    LOS: 2 days   Zaivion Kundrat L 07/22/2016, 8:55 AM

## 2016-07-22 NOTE — Telephone Encounter (Signed)
Called pt re missed appointment.  Left message for pt to call us to let us know if he is going to continue wound therapy here or since ha ias been in the hospital if there is a change in treatment plan.  Rayetta Humphrey, [place] CLT [phone]

## 2016-07-22 NOTE — Progress Notes (Signed)
Patient complain of constipation. Dr. Luan Pulling notified. New order for Miralax 17g qdaily PRN.

## 2016-07-22 NOTE — Consult Note (Signed)
   [facility] CM Inpatient Consult   07/22/2016  John Sampson [birth year] [id number]  Spoke with patient at bedside regarding [facility] services. Patient does not want to participate with [facility] at this time, stating that he is well managed at home as far as his chronic illness, states  "I just need my hand and arm fixed" referring to cellulitis infection on right arm/hand. Patient given [facility] brochure and contact information for future reference.   Of note, [facility] Care Management services would not replace or interfere with any services that are arranged by inpatient case management or social work.  For additional questions or referrals please contact:  Royetta Crochet. Laymond Purser, RN, BSN, [place] [facility] Liaison [phone]

## 2016-07-23 LAB — BASIC METABOLIC PANEL
ANION GAP: 7 (ref 5–15)
BUN: 22 mg/dL — ABNORMAL HIGH (ref 6–20)
CALCIUM: 9.2 mg/dL (ref 8.9–10.3)
CHLORIDE: 105 mmol/L (ref 101–111)
CO2: 26 mmol/L (ref 22–32)
Creatinine, Ser: 1.16 mg/dL (ref 0.61–1.24)
GFR calc non Af Amer: 58 mL/min — ABNORMAL LOW (ref >60)
Glucose, Bld: 96 mg/dL (ref 65–99)
POTASSIUM: 3.8 mmol/L (ref 3.5–5.1)
Sodium: 138 mmol/L (ref 135–145)

## 2016-07-23 MED ORDER — VANCOMYCIN HCL IN DEXTROSE 1-5 GM/200ML-% IV SOLN
1000.0000 mg | Freq: Two times a day (BID) | INTRAVENOUS | Status: DC
Start: 2016-07-24 — End: 2016-07-24
  Administered 2016-07-24: 1000 mg via INTRAVENOUS
  Filled 2016-07-23: qty 200

## 2016-07-23 NOTE — Evaluation (Signed)
Physical Therapy Evaluation Patient Details Name: John Sampson MRN: [id number] DOB: 31-Dec-1935 Today's Date: 07/23/2016   History of Present Illness  80 year old, who has multiple medical problems, and who had been in his usual state of fairly poor health, but hit his right arm when a screen door hit his arm.  He was carrying groceries.  He has been going to the wound Center, because he has had significant problem with that and he has had a lot of bleeding.  He is on Eliquis for anticoagulation.  Then about 5 days ago, he hit his right wrist and then last night, he has noted increased swelling and redness of the wrist area.  Pain in his arm.  He has not had any definite fever.  He is not on any antibiotics. As mentioned, he is on Eliquis for anticoagulation.PMH: atrialflutter, COPD, coronary arterial disease, status post bypass grafting, history of gastric ulcer, reflux, iron-deficiency anemia, previous myocardial infarction, peripheral arterial disease, and a pituitary adenoma.  aortogram, cardioversion, colonoscopy, coronary artery bypass grafting, craniotomy, EGD, eye surgery, Givens capsule study, arteriograms of the lower extremities, and tonsillectomy.  Clinical Impression  Pt received sitting up in his chair, and was agreeable to PT evaluation.  Pt expressed that he feels his forearm was wrapped too tightly at last OPPT visit, and he states that by the next day he had to take the wrap off because it was hurting so badly.  Pt is noted to have increased edema in R hand.  Therefore exercises given to help reduce edema in R hand.  Pt was visualized ambulating the halls independently earlier in the day, therefore, mobility evaluation deferred today.  Recommend that pt follow up with OPPT to resume wound care.     Follow Up Recommendations Outpatient PT (Wound care)    Equipment Recommendations  None recommended by PT    Recommendations for Other Services       Precautions / Restrictions  Precautions Precautions: None Restrictions Weight Bearing Restrictions: No      Mobility  Bed Mobility                  Transfers                    Ambulation/Gait Ambulation/Gait assistance: Independent              Stairs            Wheelchair Mobility    Modified Rankin (Stroke Patients Only)       Balance Overall balance assessment: Independent                                           Pertinent Vitals/Pain Pain Assessment: 0-10 Pain Score: 4  Pain Location: R hand Pain Descriptors / Indicators: Aching Pain Intervention(s): Limited activity within patient's tolerance    Home Living   Living Arrangements: Alone   Type of Home: House Home Access: Stairs to enter   Technical brewer of Steps: 1 step Home Layout: One level Home Equipment: None      Prior Function Level of Independence: Independent               Hand Dominance   Dominant Hand: Right    Extremity/Trunk Assessment  Communication   Communication: No difficulties  Cognition Arousal/Alertness: Awake/alert Behavior During Therapy: WFL for tasks assessed/performed Overall Cognitive Status: Within Functional Limits for tasks assessed                        Extremity/Trunk Assessment   Upper Extremity Assessment: RUE deficits/detail RUE Deficits / Details: fingers limited due to edema, all other joint AROM is WFL.  Strength also demonstrated to be [facility].          Lower Extremity Assessment: Overall WFL for tasks assessed         Communication      Cognition                            General Comments      Exercises     WRIST FLEXOR STRETCH  Use your unaffected hand to bend the affected wrist up as shown.   Keep the elbow straight on the affected side the entire time.     Hold for 30 seconds Repeat 3 times    BALL SQUEEZE  With an elastic ball or a  rolled up washcloth, firmly squeeze it in the palm of your hand.      Then extend fingers all the way.  Repeat 10 times   WRSIT EXTENSION - AROM - THIGH  Rest your arm on your thigh and bend at your wrist up and down with your palm face down as shown. Return to original position and repeat.      Repeat 10 times    Dina Disc Elbow Pronation/Supination   Place elbow on your thigh, or the arm rest of a chair. Alternate palm down and palm up (pronation and supination) "rotating your forearm."    Repeat 10 times      Assessment/Plan    PT Assessment All further PT needs can be met in the next venue of care  PT Diagnosis Generalized weakness   PT Problem List Decreased skin integrity  PT Treatment Interventions Therapeutic exercise   PT Goals (Current goals can be found in the Care Plan section) Acute Rehab PT Goals PT Goal Formulation: All assessment and education complete, DC therapy    Frequency     Barriers to discharge        Co-evaluation               End of Session   Activity Tolerance: Patient tolerated treatment well Patient left: in chair Nurse Communication: Mobility status;Other (comment) (wound dressing)    Functional Assessment Tool Used: Clinical Judgement Mobility: Walking and Moving Around Current Status (W1093): At least 1 percent but less than 20 percent impaired, limited or restricted Mobility: Walking and Moving Around Goal Status [phone]): At least 1 percent but less than 20 percent impaired, limited or restricted    Time: 1500-1544 PT Time Calculation (min) (ACUTE ONLY): 44 min   Charges:   PT Evaluation $PT Eval Low Complexity: 1 Procedure PT Treatments $Therapeutic Exercise: 23-37 mins   PT G Codes:   PT G-Codes **NOT FOR INPATIENT CLASS** Functional Assessment Tool Used: Clinical Judgement Mobility: Walking and Moving Around Current Status (D2202): At least 1 percent but less than 20 percent impaired, limited or  restricted Mobility: Walking and Moving Around Goal Status [phone]): At least 1 percent but less than 20 percent impaired, limited or restricted   Beth Jamarquis Crull, PT, DPT X: [phone]

## 2016-07-23 NOTE — Care Management Important Message (Signed)
Important Message  Patient Details  Name: John Sampson MRN: [id number] Date of Birth: [birth year]   Medicare Important Message Given:  Yes    Jozeph Persing, Chauncey [place], RN 07/23/2016, 9:02 AM

## 2016-07-23 NOTE — Progress Notes (Signed)
Pharmacy Antibiotic Note  John Sampson is a 80 y.o. male admitted on 07/20/2016 with cellulitis.  Pharmacy has been consulted for Vancomycin and Zosyn dosing. Patient feeling better, has better motion in hand. MRI showed cellulitis, no abscess which is improving. Afebrile, Scr improved.   Plan: Change Vancomycin 1000mg  IV every 12 hours.  Goal trough 10-15 mcg/mL. Zosyn 3.375g IV q8h (4 hour infusion). Monitor labs, micro and vitals.   Height: 5\' 9"  (175.3 cm) Weight: 220 lb (99.8 kg) IBW/kg (Calculated) : 70.7  Temp (24hrs), Avg:98.1 F (36.7 C), Min:98 F (36.7 C), Max:98.2 F (36.8 C)   Recent Labs Lab 07/20/16 0655 07/21/16 0537 07/22/16 0532 07/23/16 0639  WBC 13.8* 13.2*  --   --   CREATININE 0.90 1.52* 1.44* 1.16    Estimated Creatinine Clearance: 61.1 mL/min (by C-G formula based on SCr of 1.16 mg/dL).    No Known Allergies  Antimicrobials this admission: Vanc 8/22 >>  Zosyn 8/22 >>   Dose adjustments this admission: 8/24 Vancomycin 1500mg  IV q24h 8/25 Vancomycin 1gm IV q12h  Microbiology results: n/a   Thank you for allowing pharmacy to be a part of this patient's care. Isac Sarna, BS Pharm D, [place] Clinical Pharmacist Pager [phone] 07/23/2016 11:51 AM

## 2016-07-23 NOTE — Progress Notes (Signed)
Subjective: He says he feels better. He has a little better motion of his hand. No other new complaints. His MRI showed cellulitis. No abscess.  Objective: Vital signs in last 24 hours: Temp:  [98 F (36.7 C)-98.2 F (36.8 C)] 98 F (36.7 C) (08/25 0625) Pulse Rate:  [41-62] 62 (08/25 0625) Resp:  [18-20] 18 (08/25 0625) BP: (115-159)/(34-57) 159/55 (08/25 0625) SpO2:  [92 %-97 %] 95 % (08/25 0625) Weight change:  Last BM Date: 07/18/16  Intake/Output from previous day: 08/24 0701 - 08/25 0700 In: 720 [P.O.:720] Out: -   PHYSICAL EXAM General appearance: alert, cooperative and no distress Resp: clear to auscultation bilaterally Cardio: regular rate and rhythm, S1, S2 normal, no murmur, click, rub or gallop GI: soft, non-tender; bowel sounds normal; no masses,  no organomegaly Extremities: His hand is still swollen but looks better and he has better range of motion  Lab Results:  Results for orders placed or performed during the hospital encounter of 07/20/16 (from the past 48 hour(s))  Basic metabolic panel     Status: Abnormal   Collection Time: 07/22/16  5:32 AM  Result Value Ref Range   Sodium 137 135 - 145 mmol/L   Potassium 4.3 3.5 - 5.1 mmol/L   Chloride 101 101 - 111 mmol/L   CO2 27 22 - 32 mmol/L   Glucose, Bld 101 (H) 65 - 99 mg/dL   BUN 28 (H) 6 - 20 mg/dL   Creatinine, Ser 1.44 (H) 0.61 - 1.24 mg/dL   Calcium 8.8 (L) 8.9 - 10.3 mg/dL   GFR calc non Af Amer 45 (L) >60 mL/min   GFR calc Af Amer 52 (L) >60 mL/min    Comment: (NOTE) The eGFR has been calculated using the CKD EPI equation. This calculation has not been validated in all clinical situations. eGFR's persistently <60 mL/min signify possible Chronic Kidney Disease.    Anion gap 9 5 - 15  Basic metabolic panel     Status: Abnormal   Collection Time: 07/23/16  6:39 AM  Result Value Ref Range   Sodium 138 135 - 145 mmol/L   Potassium 3.8 3.5 - 5.1 mmol/L   Chloride 105 101 - 111 mmol/L   CO2 26  22 - 32 mmol/L   Glucose, Bld 96 65 - 99 mg/dL   BUN 22 (H) 6 - 20 mg/dL   Creatinine, Ser 1.16 0.61 - 1.24 mg/dL   Calcium 9.2 8.9 - 10.3 mg/dL   GFR calc non Af Amer 58 (L) >60 mL/min   GFR calc Af Amer >60 >60 mL/min    Comment: (NOTE) The eGFR has been calculated using the CKD EPI equation. This calculation has not been validated in all clinical situations. eGFR's persistently <60 mL/min signify possible Chronic Kidney Disease.    Anion gap 7 5 - 15    ABGS No results for input(s): PHART, PO2ART, TCO2, HCO3 in the last 72 hours.  Invalid input(s): PCO2 CULTURES No results found for this or any previous visit (from the past 240 hour(s)). Studies/Results: Mr Forearm Right W/o Cm  Result Date: 07/22/2016 CLINICAL DATA:  Local wound on the right forearm with swelling. Injured 2 weeks ago during a fall. EXAM: MRI OF THE RIGHT FOREARM WITHOUT CONTRAST TECHNIQUE: Multiplanar, multisequence MR imaging was performed. No intravenous contrast was administered. COMPARISON:  None. FINDINGS: Significant patient motion throughout the exam degrading image quality limiting evaluation. Bones/Joint/Cartilage No marrow signal abnormality. Old ununited fracture of the ulnar styloid process. No acute  fracture or dislocation. Normal alignment. No joint effusion. Muscles and Tendons Muscles are normal in signal. No intramuscular fluid collection or hematoma. No muscle edema. Soft tissue Severe soft tissue edema in the subcutaneous fat along the dorsal aspect of the forearm extending into the hand most concerning for cellulitis. No fluid collection or hematoma. No soft tissue mass. IMPRESSION: 1. Severe soft tissue edema in the subcutaneous fat along the dorsal aspect of the forearm extending into the hand most concerning for cellulitis. No drainable abscess. Electronically Signed   By: Kathreen Devoid   On: 07/22/2016 13:03   Dg Hand Complete Right  Result Date: 07/21/2016 CLINICAL DATA:  Hand pain and  swelling. EXAM: RIGHT HAND - COMPLETE 3+ VIEW COMPARISON:  No recent prior. FINDINGS: Diffuse soft tissue swelling. No radiopaque foreign body. No acute bony or joint abnormality identified. No evidence of fracture or dislocation. Diffuse degenerative change. Old ulnar styloid fracture noted. Small sclerotic density noted the distal radial metaphysis most likely bone island or infarct. If symptoms persist MRI can be obtained. IMPRESSION: 1. Diffuse soft tissue swelling.  No radiopaque foreign body. 2. No acute bony abnormality. Diffuse degenerative change. Old ulnar styloid fracture. Tiny sclerotic focus in the distal right radial metaphysis, most likely bone island or old infarct . Electronically Signed   By: [place]   On: 07/21/2016 10:00    Medications:  Prior to Admission:  Prescriptions Prior to Admission  Medication Sig Dispense Refill Last Dose  . acetaminophen (TYLENOL) 325 MG tablet Take 650 mg by mouth daily as needed for headache.   Past Week at Unknown time  . albuterol (PROVENTIL HFA) 108 (90 BASE) MCG/ACT inhaler Inhale 2 puffs into the lungs every 6 (six) hours as needed for wheezing or shortness of breath. 1 Inhaler 3 07/20/2016 at Unknown time  . apixaban (ELIQUIS) 5 MG TABS tablet Take 1 tablet (5 mg total) by mouth 2 (two) times daily. 60 tablet 3 07/19/2016 at 0930  . diltiazem (CARDIZEM CD) 240 MG 24 hr capsule Take 1 capsule (240 mg total) by mouth daily. 90 capsule 3 07/19/2016 at Unknown time  . docusate sodium (COLACE) 100 MG capsule Take 200 mg by mouth at bedtime.   07/19/2016 at Unknown time  . fish oil-omega-3 fatty acids 1000 MG capsule Take 1 g by mouth 2 (two) times daily.    07/19/2016 at Unknown time  . folic acid (FOLVITE) 1 MG tablet Take 1 mg by mouth daily.    07/19/2016 at Unknown time  . furosemide (LASIX) 20 MG tablet Take 1 tablet (20 mg total) by mouth daily. 90 tablet 3 unknown  . lisinopril (PRINIVIL,ZESTRIL) 10 MG tablet Take 1 tablet (10 mg total) by  mouth daily. 90 tablet 3 07/19/2016 at Unknown time  . metoprolol succinate (TOPROL-XL) 50 MG 24 hr tablet Take 50 mg by mouth 2 (two) times daily. Take with or immediately following a meal.   07/19/2016 at 0930  . nitroGLYCERIN (NITROSTAT) 0.4 MG SL tablet Place 1 tablet (0.4 mg total) under the tongue every 5 (five) minutes as needed for chest pain. 30 tablet 9 unknown  . omeprazole (PRILOSEC) 20 MG capsule Take 20 mg by mouth 2 (two) times daily before a meal.   07/19/2016 at Unknown time  . potassium chloride SA (K-DUR,KLOR-CON) 20 MEQ tablet Take 20 mEq by mouth daily.   07/19/2016 at Unknown time  . simvastatin (ZOCOR) 40 MG tablet Take 1 tablet (40 mg total) by mouth daily. (Patient  taking differently: Take 20 mg by mouth at bedtime. ) 30 tablet 6 07/19/2016 at Unknown time  . ferrous sulfate 325 (65 FE) MG tablet Take 1 tablet (325 mg total) by mouth 2 (two) times daily with a meal. (Patient not taking: Reported on 07/20/2016)   Completed Course   Scheduled: . atorvastatin  10 mg Oral q1800  . diltiazem  240 mg Oral Daily  . folic acid  1 mg Oral Daily  . furosemide  20 mg Oral Daily  . lisinopril  10 mg Oral Daily  . metoprolol succinate  50 mg Oral BID  . pantoprazole  40 mg Oral Daily  . piperacillin-tazobactam (ZOSYN)  IV  3.375 g Intravenous Q8H  . sodium chloride flush  3 mL Intravenous Q12H  . vancomycin  1,500 mg Intravenous Q12H   Continuous:  GYF:EETOLN chloride, acetaminophen **OR** acetaminophen, albuterol, HYDROcodone-acetaminophen, nitroGLYCERIN, ondansetron **OR** ondansetron (ZOFRAN) IV, phenol, polyethylene glycol, sodium chloride flush  Assesment: He has cellulitis and abscess of his hand which is improving. He remains on IV antibiotics. He has multiple other medical problems that are stable at this point. No chest pain. No evidence of atrial fib. His blood pressure is doing okay. His breathing is doing okay. Principal Problem:   Cellulitis and abscess of hand Active  Problems:   Essential hypertension, benign   Hx of CABG   COPD (chronic obstructive pulmonary disease) (HCC)   PAF (paroxysmal atrial fibrillation) (HCC)   Cellulitis of right arm    Plan: Continue current treatments. Ask PT to see him and give him exercises for his hand    LOS: 3 days   Aviona Martenson L 07/23/2016, 8:57 AM

## 2016-07-24 DIAGNOSIS — I5033 Acute on chronic diastolic (congestive) heart failure: Secondary | ICD-10-CM | POA: Diagnosis present

## 2016-07-24 MED ORDER — ALBUTEROL SULFATE (2.5 MG/3ML) 0.083% IN NEBU
2.5000 mg | INHALATION_SOLUTION | RESPIRATORY_TRACT | Status: DC
  Administered 2016-07-24 (×3): 2.5 mg via RESPIRATORY_TRACT
  Filled 2016-07-24 (×3): qty 3

## 2016-07-24 MED ORDER — ALBUTEROL SULFATE (2.5 MG/3ML) 0.083% IN NEBU
2.5000 mg | INHALATION_SOLUTION | RESPIRATORY_TRACT | Status: DC
  Administered 2016-07-24 – 2016-07-25 (×2): 2.5 mg via RESPIRATORY_TRACT
  Filled 2016-07-24 (×2): qty 3

## 2016-07-24 MED ORDER — FUROSEMIDE 40 MG PO TABS
40.0000 mg | ORAL_TABLET | Freq: Every day | ORAL | Status: DC
  Administered 2016-07-24 – 2016-07-25 (×2): 40 mg via ORAL
  Filled 2016-07-24 (×2): qty 1

## 2016-07-24 MED ORDER — DOXYCYCLINE HYCLATE 100 MG PO TABS
100.0000 mg | ORAL_TABLET | Freq: Two times a day (BID) | ORAL | Status: DC
  Administered 2016-07-24 – 2016-07-25 (×3): 100 mg via ORAL
  Filled 2016-07-24 (×4): qty 1

## 2016-07-24 NOTE — Progress Notes (Signed)
Subjective: He says he feels better. His IV infiltrated again. His right hand looks much improved his right forearm looks improved but he has swelling of the left hand from the IV infiltration. He says he still short of breath. He has a little bit of swelling in his legs  Objective: Vital signs in last 24 hours: Temp:  [98.1 F (36.7 C)-98.6 F (37 C)] 98.6 F (37 C) (08/26 0441) Pulse Rate:  [47-65] 65 (08/26 0441) Resp:  [20] 20 (08/26 0441) BP: (135-156)/(48-62) 156/55 (08/26 0441) SpO2:  [91 %-96 %] 95 % (08/26 0441) Weight change:  Last BM Date: 07/18/16  Intake/Output from previous day: 08/25 0701 - 08/26 0700 In: 780 [P.O.:480; IV Piggyback:300] Out: -   PHYSICAL EXAM General appearance: alert, cooperative and no distress Resp: rhonchi bilaterally Cardio: regular rate and rhythm, S1, S2 normal, no murmur, click, rub or gallop GI: soft, non-tender; bowel sounds normal; no masses,  no organomegaly Extremities: Trace edema  Lab Results:  Results for orders placed or performed during the hospital encounter of 07/20/16 (from the past 48 hour(s))  Basic metabolic panel     Status: Abnormal   Collection Time: 07/23/16  6:39 AM  Result Value Ref Range   Sodium 138 135 - 145 mmol/Sampson   Potassium 3.8 3.5 - 5.1 mmol/Sampson   Chloride 105 101 - 111 mmol/Sampson   CO2 26 22 - 32 mmol/Sampson   Glucose, Bld 96 65 - 99 mg/dL   BUN 22 (H) 6 - 20 mg/dL   Creatinine, Ser 1.16 0.61 - 1.24 mg/dL   Calcium 9.2 8.9 - 10.3 mg/dL   GFR calc non Af Amer 58 (Sampson) >60 mL/min   GFR calc Af Amer >60 >60 mL/min    Comment: (NOTE) The eGFR has been calculated using the CKD EPI equation. This calculation has not been validated in all clinical situations. eGFR's persistently <60 mL/min signify possible Chronic Kidney Disease.    Anion gap 7 5 - 15    ABGS No results for input(s): PHART, PO2ART, TCO2, HCO3 in the last 72 hours.  Invalid input(s): PCO2 CULTURES No results found for this or any previous  visit (from the past 240 hour(s)). Studies/Results: No results found.  Medications:  Prior to Admission:  Prescriptions Prior to Admission  Medication Sig Dispense Refill Last Dose  . acetaminophen (TYLENOL) 325 MG tablet Take 650 mg by mouth daily as needed for headache.   Past Week at Unknown time  . albuterol (PROVENTIL HFA) 108 (90 BASE) MCG/ACT inhaler Inhale 2 puffs into the lungs every 6 (six) hours as needed for wheezing or shortness of breath. 1 Inhaler 3 07/20/2016 at Unknown time  . apixaban (ELIQUIS) 5 MG TABS tablet Take 1 tablet (5 mg total) by mouth 2 (two) times daily. 60 tablet 3 07/19/2016 at 0930  . diltiazem (CARDIZEM CD) 240 MG 24 hr capsule Take 1 capsule (240 mg total) by mouth daily. 90 capsule 3 07/19/2016 at Unknown time  . docusate sodium (COLACE) 100 MG capsule Take 200 mg by mouth at bedtime.   07/19/2016 at Unknown time  . fish oil-omega-3 fatty acids 1000 MG capsule Take 1 g by mouth 2 (two) times daily.    07/19/2016 at Unknown time  . folic acid (FOLVITE) 1 MG tablet Take 1 mg by mouth daily.    07/19/2016 at Unknown time  . furosemide (LASIX) 20 MG tablet Take 1 tablet (20 mg total) by mouth daily. 90 tablet 3 unknown  . lisinopril (  PRINIVIL,ZESTRIL) 10 MG tablet Take 1 tablet (10 mg total) by mouth daily. 90 tablet 3 07/19/2016 at Unknown time  . metoprolol succinate (TOPROL-XL) 50 MG 24 hr tablet Take 50 mg by mouth 2 (two) times daily. Take with or immediately following a meal.   07/19/2016 at 0930  . nitroGLYCERIN (NITROSTAT) 0.4 MG SL tablet Place 1 tablet (0.4 mg total) under the tongue every 5 (five) minutes as needed for chest pain. 30 tablet 9 unknown  . omeprazole (PRILOSEC) 20 MG capsule Take 20 mg by mouth 2 (two) times daily before a meal.   07/19/2016 at Unknown time  . potassium chloride SA (K-DUR,KLOR-CON) 20 MEQ tablet Take 20 mEq by mouth daily.   07/19/2016 at Unknown time  . simvastatin (ZOCOR) 40 MG tablet Take 1 tablet (40 mg total) by mouth daily.  (Patient taking differently: Take 20 mg by mouth at bedtime. ) 30 tablet 6 07/19/2016 at Unknown time  . ferrous sulfate 325 (65 FE) MG tablet Take 1 tablet (325 mg total) by mouth 2 (two) times daily with a meal. (Patient not taking: Reported on 07/20/2016)   Completed Course   Scheduled: . albuterol  2.5 mg Nebulization Q4H  . atorvastatin  10 mg Oral q1800  . diltiazem  240 mg Oral Daily  . doxycycline  100 mg Oral Q12H  . folic acid  1 mg Oral Daily  . furosemide  40 mg Oral Daily  . lisinopril  10 mg Oral Daily  . metoprolol succinate  50 mg Oral BID  . pantoprazole  40 mg Oral Daily  . sodium chloride flush  3 mL Intravenous Q12H   Continuous:  JME:QASTMH chloride, acetaminophen **OR** acetaminophen, HYDROcodone-acetaminophen, nitroGLYCERIN, ondansetron **OR** ondansetron (ZOFRAN) IV, phenol, polyethylene glycol, sodium chloride flush  Assesment: He has cellulitis and abscess of his hand and forearm. He's better. He has chronic diastolic heart failure and he has a little bit of fluid in his legs I don't think this is an acute exacerbation I think is probably more positional. He has COPD which is stable but he is short of breath. He's had trouble with IV access. Principal Problem:   Cellulitis and abscess of hand Active Problems:   Essential hypertension, benign   Hx of CABG   COPD (chronic obstructive pulmonary disease) (HCC)   PAD (peripheral artery disease) (HCC)   PAF (paroxysmal atrial fibrillation) (HCC)   Cellulitis of right arm   Chronic diastolic congestive heart failure ([place])    Plan: Discontinue IV meds. Increase his Lasix to 40 today getting up and moving around change his nebulizer treatments to scheduled since he feels like he's having a little more trouble with shortness of breath. He's not been requesting any nebulizer treatments. Start doxycycline. Probable discharge tomorrow    LOS: 4 days   John Sampson 07/24/2016, 10:14 AM

## 2016-07-25 MED ORDER — SIMVASTATIN 40 MG PO TABS: 20.0000 mg | ORAL_TABLET | Freq: Every day | ORAL | 12 refills | Status: DC

## 2016-07-25 MED ORDER — DOXYCYCLINE HYCLATE 100 MG PO TABS: 100.0000 mg | ORAL_TABLET | Freq: Two times a day (BID) | ORAL | 1 refills | Status: DC

## 2016-07-25 MED ORDER — ALBUTEROL SULFATE (2.5 MG/3ML) 0.083% IN NEBU: 2.5000 mg | INHALATION_SOLUTION | Freq: Two times a day (BID) | RESPIRATORY_TRACT | Status: DC

## 2016-07-25 NOTE — Discharge Summary (Signed)
Physician Discharge Summary  Patient ID: John Sampson MRN: [id number] DOB/AGE: Oct 14, 1936 80 y.o. Primary Care Physician:Giana Castner L, MD Admit date: 07/20/2016 Discharge date: 07/25/2016    Discharge Diagnoses:   Principal Problem:   Cellulitis and abscess of hand Active Problems:   Essential hypertension, benign   Hx of CABG   COPD (chronic obstructive pulmonary disease) (HCC)   PAD (peripheral artery disease) (HCC)   PAF (paroxysmal atrial fibrillation) (HCC)   Cellulitis of right arm   Chronic diastolic congestive heart failure ([place])     Medication List    TAKE these medications   acetaminophen 325 MG tablet Commonly known as:  TYLENOL Take 650 mg by mouth daily as needed for headache.   albuterol 108 (90 Base) MCG/ACT inhaler Commonly known as:  PROVENTIL HFA Inhale 2 puffs into the lungs every 6 (six) hours as needed for wheezing or shortness of breath.   apixaban 5 MG Tabs tablet Commonly known as:  ELIQUIS Take 1 tablet (5 mg total) by mouth 2 (two) times daily.   diltiazem 240 MG 24 hr capsule Commonly known as:  CARDIZEM CD Take 1 capsule (240 mg total) by mouth daily.   docusate sodium 100 MG capsule Commonly known as:  COLACE Take 200 mg by mouth at bedtime.   doxycycline 100 MG tablet Commonly known as:  VIBRA-TABS Take 1 tablet (100 mg total) by mouth every 12 (twelve) hours.   ferrous sulfate 325 (65 FE) MG tablet Take 1 tablet (325 mg total) by mouth 2 (two) times daily with a meal.   fish oil-omega-3 fatty acids 1000 MG capsule Take 1 g by mouth 2 (two) times daily.   folic acid 1 MG tablet Commonly known as:  FOLVITE Take 1 mg by mouth daily.   furosemide 20 MG tablet Commonly known as:  LASIX Take 1 tablet (20 mg total) by mouth daily.   lisinopril 10 MG tablet Commonly known as:  PRINIVIL,ZESTRIL Take 1 tablet (10 mg total) by mouth daily.   metoprolol succinate 50 MG 24 hr tablet Commonly known as:  TOPROL-XL Take 50 mg by  mouth 2 (two) times daily. Take with or immediately following a meal.   nitroGLYCERIN 0.4 MG SL tablet Commonly known as:  NITROSTAT Place 1 tablet (0.4 mg total) under the tongue every 5 (five) minutes as needed for chest pain.   omeprazole 20 MG capsule Commonly known as:  PRILOSEC Take 20 mg by mouth 2 (two) times daily before a meal.   potassium chloride SA 20 MEQ tablet Commonly known as:  K-DUR,KLOR-CON Take 20 mEq by mouth daily.   simvastatin 40 MG tablet Commonly known as:  ZOCOR Take 0.5 tablets (20 mg total) by mouth at bedtime.       Discharged Condition:Improved    Consults: None  Significant Diagnostic Studies: Mr Forearm Right W/o Cm  Result Date: 07/22/2016 CLINICAL DATA:  Local wound on the right forearm with swelling. Injured 2 weeks ago during a fall. EXAM: MRI OF THE RIGHT FOREARM WITHOUT CONTRAST TECHNIQUE: Multiplanar, multisequence MR imaging was performed. No intravenous contrast was administered. COMPARISON:  None. FINDINGS: Significant patient motion throughout the exam degrading image quality limiting evaluation. Bones/Joint/Cartilage No marrow signal abnormality. Old ununited fracture of the ulnar styloid process. No acute fracture or dislocation. Normal alignment. No joint effusion. Muscles and Tendons Muscles are normal in signal. No intramuscular fluid collection or hematoma. No muscle edema. Soft tissue Severe soft tissue edema in the subcutaneous fat along the dorsal  aspect of the forearm extending into the hand most concerning for cellulitis. No fluid collection or hematoma. No soft tissue mass. IMPRESSION: 1. Severe soft tissue edema in the subcutaneous fat along the dorsal aspect of the forearm extending into the hand most concerning for cellulitis. No drainable abscess. Electronically Signed   By: Kathreen Devoid   On: 07/22/2016 13:03   Dg Hand Complete Right  Result Date: 07/21/2016 CLINICAL DATA:  Hand pain and swelling. EXAM: RIGHT HAND -  COMPLETE 3+ VIEW COMPARISON:  No recent prior. FINDINGS: Diffuse soft tissue swelling. No radiopaque foreign body. No acute bony or joint abnormality identified. No evidence of fracture or dislocation. Diffuse degenerative change. Old ulnar styloid fracture noted. Small sclerotic density noted the distal radial metaphysis most likely bone island or infarct. If symptoms persist MRI can be obtained. IMPRESSION: 1. Diffuse soft tissue swelling.  No radiopaque foreign body. 2. No acute bony abnormality. Diffuse degenerative change. Old ulnar styloid fracture. Tiny sclerotic focus in the distal right radial metaphysis, most likely bone island or old infarct . Electronically Signed   By: Marcello Moores  Register   On: 07/21/2016 10:00    Lab Results: Basic Metabolic Panel:  Recent Labs  07/23/16 0639  NA 138  K 3.8  CL 105  CO2 26  GLUCOSE 96  BUN 22*  CREATININE 1.16  CALCIUM 9.2   Liver Function Tests: No results for input(s): AST, ALT, ALKPHOS, BILITOT, PROT, ALBUMIN in the last 72 hours.   CBC: No results for input(s): WBC, NEUTROABS, HGB, HCT, MCV, PLT in the last 72 hours.  No results found for this or any previous visit (from the past 240 hour(s)).   Hospital Course: This is a 80 year old who had 2 separate injuries to his right wrist and arm. He had excoriation of the skin and developed cellulitis. His cellulitis worsened despite going to a wound clinic and having oral antibiotics and he was admitted to the hospital for IV antibiotics. He received Zosyn and vancomycin and then was transitioned to doxycycline. Doxycycline worked well for him and he is going to be discharged home on that. He has multiple other medical problems mostly related to vascular disease which were stable during the hospitalization. He has COPD at baseline.  Discharge Exam: Blood pressure (!) 153/68, pulse (!) 50, temperature 98 F (36.7 C), temperature source Oral, resp. rate 20, height 5\' 9"  (1.753 m), weight 99.8 kg  (220 lb), SpO2 97 %. He is awake and alert. His cellulitis is much improved both on his hand and his wrist. The swelling is essentially gone.  Disposition: Home we discussed home health services and he does not want that. He will follow-up in my office      Signed: Azula Zappia L   07/25/2016, 10:33 AM

## 2016-07-25 NOTE — Progress Notes (Signed)
Patient discharging home.  Reviewed DC instructions and medications.  Verbalizes understanding.  No questions at this time.  In NAD at this time, awaiting arrival of ride

## 2016-07-25 NOTE — Progress Notes (Signed)
Subjective: He feels better. He wants to go home.  Objective: Vital signs in last 24 hours: Temp:  [98 F (36.7 C)-99 F (37.2 C)] 98 F (36.7 C) (08/27 0606) Pulse Rate:  [50-57] 50 (08/27 0606) Resp:  [18-20] 20 (08/27 0606) BP: (146-166)/(55-68) 153/68 (08/27 0606) SpO2:  [92 %-97 %] 97 % (08/27 0730) Weight change:  Last BM Date: 07/23/16  Intake/Output from previous day: 08/26 0701 - 08/27 0700 In: 240 [P.O.:240] Out: -   PHYSICAL EXAM General appearance: alert, cooperative and no distress Resp: clear to auscultation bilaterally Cardio: regular rate and rhythm, S1, S2 normal, no murmur, click, rub or gallop GI: soft, non-tender; bowel sounds normal; no masses,  no organomegaly Extremities: His left hand which was swollen yesterday from an infiltrated IV is back to normal. His right hand is much improved. His right wrist is much improved  Lab Results:  No results found for this or any previous visit (from the past 48 hour(s)).  ABGS No results for input(s): PHART, PO2ART, TCO2, HCO3 in the last 72 hours.  Invalid input(s): PCO2 CULTURES No results found for this or any previous visit (from the past 240 hour(s)). Studies/Results: No results found.  Medications:  Prior to Admission:  Prescriptions Prior to Admission  Medication Sig Dispense Refill Last Dose  . acetaminophen (TYLENOL) 325 MG tablet Take 650 mg by mouth daily as needed for headache.   Past Week at Unknown time  . albuterol (PROVENTIL HFA) 108 (90 BASE) MCG/ACT inhaler Inhale 2 puffs into the lungs every 6 (six) hours as needed for wheezing or shortness of breath. 1 Inhaler 3 07/20/2016 at Unknown time  . apixaban (ELIQUIS) 5 MG TABS tablet Take 1 tablet (5 mg total) by mouth 2 (two) times daily. 60 tablet 3 07/19/2016 at 0930  . diltiazem (CARDIZEM CD) 240 MG 24 hr capsule Take 1 capsule (240 mg total) by mouth daily. 90 capsule 3 07/19/2016 at Unknown time  . docusate sodium (COLACE) 100 MG capsule Take  200 mg by mouth at bedtime.   07/19/2016 at Unknown time  . fish oil-omega-3 fatty acids 1000 MG capsule Take 1 g by mouth 2 (two) times daily.    07/19/2016 at Unknown time  . folic acid (FOLVITE) 1 MG tablet Take 1 mg by mouth daily.    07/19/2016 at Unknown time  . furosemide (LASIX) 20 MG tablet Take 1 tablet (20 mg total) by mouth daily. 90 tablet 3 unknown  . lisinopril (PRINIVIL,ZESTRIL) 10 MG tablet Take 1 tablet (10 mg total) by mouth daily. 90 tablet 3 07/19/2016 at Unknown time  . metoprolol succinate (TOPROL-XL) 50 MG 24 hr tablet Take 50 mg by mouth 2 (two) times daily. Take with or immediately following a meal.   07/19/2016 at 0930  . nitroGLYCERIN (NITROSTAT) 0.4 MG SL tablet Place 1 tablet (0.4 mg total) under the tongue every 5 (five) minutes as needed for chest pain. 30 tablet 9 unknown  . omeprazole (PRILOSEC) 20 MG capsule Take 20 mg by mouth 2 (two) times daily before a meal.   07/19/2016 at Unknown time  . potassium chloride SA (K-DUR,KLOR-CON) 20 MEQ tablet Take 20 mEq by mouth daily.   07/19/2016 at Unknown time  . simvastatin (ZOCOR) 40 MG tablet Take 1 tablet (40 mg total) by mouth daily. (Patient taking differently: Take 20 mg by mouth at bedtime. ) 30 tablet 6 07/19/2016 at Unknown time  . ferrous sulfate 325 (65 FE) MG tablet Take 1 tablet (325  mg total) by mouth 2 (two) times daily with a meal. (Patient not taking: Reported on 07/20/2016)   Completed Course   Scheduled: . albuterol  2.5 mg Nebulization Q4H WA  . atorvastatin  10 mg Oral q1800  . diltiazem  240 mg Oral Daily  . doxycycline  100 mg Oral Q12H  . folic acid  1 mg Oral Daily  . furosemide  40 mg Oral Daily  . lisinopril  10 mg Oral Daily  . metoprolol succinate  50 mg Oral BID  . pantoprazole  40 mg Oral Daily  . sodium chloride flush  3 mL Intravenous Q12H   Continuous:  [id number] chloride, acetaminophen **OR** acetaminophen, HYDROcodone-acetaminophen, nitroGLYCERIN, ondansetron **OR** ondansetron (ZOFRAN)  IV, phenol, polyethylene glycol, sodium chloride flush  Assesment: He was admitted with cellulitis and abscess of his hand and right arm. He had several injuries. He failed outpatient treatment. He is substantially better and ready for discharge  He has COPD at baseline which is pretty severe but he has stopped smoking  He has paroxysmal atrial fibrillation on anticoagulation which I held because I thought part of his problem was related to a hematoma. He will resume that  He has chronic diastolic heart failure which is stable  He has severe peripheral arterial disease also improved after procedures Principal Problem:   Cellulitis and abscess of hand Active Problems:   Essential hypertension, benign   Hx of CABG   COPD (chronic obstructive pulmonary disease) (HCC)   PAD (peripheral artery disease) (HCC)   PAF (paroxysmal atrial fibrillation) (HCC)   Cellulitis of right arm   Chronic diastolic congestive heart failure ([place])    Plan: Discharge home today please see discharge summary for details    LOS: 5 days   John Sampson L 07/25/2016, 10:28 AM

## 2016-07-27 ENCOUNTER — Ambulatory Visit (HOSPITAL_COMMUNITY): Payer: Medicare Other | Admitting: Physical Therapy

## 2016-07-27 ENCOUNTER — Encounter: Payer: Self-pay | Admitting: Vascular Surgery

## 2016-07-27 DIAGNOSIS — X58XXXD Exposure to other specified factors, subsequent encounter: Secondary | ICD-10-CM | POA: Diagnosis not present

## 2016-07-27 DIAGNOSIS — M79601 Pain in right arm: Secondary | ICD-10-CM

## 2016-07-27 DIAGNOSIS — S51811D Laceration without foreign body of right forearm, subsequent encounter: Secondary | ICD-10-CM

## 2016-07-27 NOTE — Therapy (Signed)
[facility] [address] [place], [place], [address] Phone: [phone]   Fax:  [phone]  Wound Care Therapy  Patient Details  Name: John Sampson MRN: [id number] Date of Birth: 13-Jun-1936 Referring Provider: Dr. Sinda Du  Encounter Date: 07/27/2016      PT End of Session - 07/27/16 0927    Visit Number 2   Number of Visits 4   Date for PT Re-Evaluation 07/29/16   Authorization Type medicare   PT Start Time 0855   PT Stop Time 0912   PT Time Calculation (min) 17 min   Activity Tolerance Patient tolerated treatment well   Behavior During Therapy [facility] for tasks assessed/performed      Past Medical History:  Diagnosis Date  . Atrial flutter ([place])   . COPD (chronic obstructive pulmonary disease) ([place]) 3.12.14   2D Echo, EF 50-55%  . Coronary atherosclerosis of native coronary artery    Multivessel status post CABG  . Gastric ulcer    Small - nonbleeding  . GERD (gastroesophageal reflux disease)   . Headache(784.0)   . Iron deficiency anemia    Negative Givens capsule study   . Myocardial infarction ([place])   . PAD (peripheral artery disease) (HCC)    Moderate bilateral SFA disease at angiography 01/2013  . Pituitary macroadenoma [facility])     Past Surgical History:  Procedure Laterality Date  . ABDOMINAL AORTAGRAM N/A 02/19/2013   Procedure: ABDOMINAL Maxcine Ham;  Surgeon: [name], MD;  Location: [facility] CATH LAB;  Service: Cardiovascular;  Laterality: N/A;  . CARDIOVERSION N/A 06/16/2015   Procedure: CARDIOVERSION;  Surgeon: Satira Sark, MD;  Location: [facility] ORS;  Service: Cardiovascular;  Laterality: N/A;  . COLONOSCOPY  08/18/2012   Procedure: COLONOSCOPY;  Surgeon: [name], MD;  Location[facility] ENDO SUITE;  Service: Endoscopy;  Laterality: N/A;  1:25  . CORONARY ARTERY BYPASS GRAFT  2003   5 grafts - details not clear  . CRANIOTOMY  12/31/2011   Procedure: CRANIOTOMY HYPOPHYSECTOMY TRANSNASAL APPROACH;   Surgeon: [name], MD;  Location: [place] NEURO ORS;  Service: Neurosurgery;  Laterality: N/A;  Transphenoidal Hypophysectomy With Fat Graft Harvest from right abdomen   . ESOPHAGOGASTRODUODENOSCOPY  08/18/2012   Procedure: ESOPHAGOGASTRODUODENOSCOPY (EGD);  Surgeon: [name], MD;  Location[facility] ENDO SU[facility];  Service: Endoscopy;  Laterality: N/A;  . EYE SURGERY  2012  . GIVENS CAPSULE STUDY N/A 01/25/2013   Procedure: GIVENS CAPSULE STUDY;  Surgeon: [name], MD;  [facility] ENDO SUITE;  Service: Endoscopy;  Laterality: N/A;  730  . LOWER EXTREMITY ANGIOGRAM N/A 02/19/2013   Procedure: LOWER EXTREMITY ANGIOGRAM;  Surgeon: [name], MD;  Location: [facility] CATH LAB;  Service: Cardiovascular;  Laterality: N/A;  . PERIPHERAL VASCULAR CATHETERIZATION N/A 01/19/2016   Procedure: Abdominal Aortogram;  Surgeon: Conrad [place], MD;  Location: [place] CV LAB;  Service: Cardiovascular;  Laterality: N/A;  . PERIPHERAL VASCULAR CATHETERIZATION Bilateral 01/19/2016   Procedure: Lower Extremity Angiography;  Surgeon: Conrad [place], MD;  Location: [place] CV LAB;  Service: Cardiovascular;  Laterality: Bilateral;  . PV Angiogram  02/19/13   Indications: slow healing left calf ulcer  . Stress Myocardial Perfusion  12/08/2011   Indications: Abnormal EKG, Eval of prior GABG  . TEE WITHOUT CARDIOVERSION N/A 06/16/2015   Procedure: TRANSESOPHAGEAL ECHOCARDIOGRAM (TEE);  Surgeon: Satira Sark, MD;  Location: [facility] ORS;  Service: Cardiovascular;  Laterality: N/A;  . TONSILLECTOMY  Age 47  There were no vitals filed for this visit.                  Wound Therapy - 07/27/16 0909    Subjective Pt states that he went to the hospital on 07/20/16.  States that his hand got as big as a baseball and was very painful.  His forearm was fine.  Pt was admitted into the hospital for cellulitis for five days of IV antibiotic.  He is still on oral antibiotics at this time.  He feels much better.     Patient and Family Stated Goals wound to completely heal    Date of Onset 07/05/16   Prior Treatments one treatment of OP therapy, 5 days of IV antibiotics    Pain Assessment No/denies pain   Evaluation and Treatment Procedures Explained to Patient/Family Yes   Evaluation and Treatment Procedures agreed to   Wound Properties Date First Assessed: 07/15/16 Time First Assessed: 0849 Wound Type: Laceration Location: Arm Location Orientation: Right Wound Description (Comments): dorsal aspect of forearm  Present on Admission: Yes Final Assessment Date: 07/27/16 Final Assessment Time: 0900   Dressing Type Gauze (Comment);Impregnated gauze (petrolatum)  Was foam dressing,(appplied at hospital),   Dressing Changed Changed   Dressing Status Clean;Old drainage   Dressing Change Frequency PRN   Site / Wound Assessment Clean;Red   % Wound base Red or Granulating 100%   % Wound base Yellow 0%   Peri-wound Assessment Edema   Treatment Cleansed  Skin is friable therefore placed xeroform and covered    Wound Properties Date First Assessed: 07/27/16 Time First Assessed: 0905 Wound Type: Laceration Location: Hand Location Orientation: Right Wound Description (Comments): Dorsal aspect of 5th meta carpal bone    Dressing Type Gauze (Comment);Impregnated gauze (petrolatum)  was foam which was applied at hospital    Dressing Changed Changed   Dressing Status Old drainage   Dressing Change Frequency PRN   Site / Wound Assessment Red;Yellow   % Wound base Red or Granulating 80%   % Wound base Yellow 20%   Peri-wound Assessment Edema;Erythema (blanchable)   Wound Length (cm) 0.7 cm  2 areas side by side which are about the same size    Wound Width (cm) 0.7 cm   Margins Attached edges (approximated)   Closure None   Drainage Amount Scant   Drainage Description Serous   Treatment Cleansed;Other (Comment)  education    Selective Debridement - Location Rt dorsal forearm   Selective Debridement - Tools Used  Forceps   Selective Debridement - Tissue Removed slough   Wound Therapy - Clinical Statement Pt is a 80 yo male who has injured his Rt forearm in a storm door approximately 10 days ago.  He has been caring for the wound but it does not seem to want to heal and he noted some swelling therefore he sought medical attention and was referred to skilled physical therapy.  Do to history of non healing wounds, CAD and PAD Mr. Elmendorf will benefit from skilled PT to create a healing envioronment for his wound to close.    Wound Therapy - Functional Problem List non healing wound limiting use of RT UE    Factors Delaying/Impairing Wound Healing Infection - systemic/local;Multiple medical problems;Polypharmacy;Vascular compromise   Hydrotherapy Plan Debridement;Dressing change;Patient/family education   Wound Therapy - Frequency --  2 x a week for 2 weeks    Wound Therapy - Current Recommendations PT   Dressing  xeroform, 4x4 and gauze with  netting to hold dressing in place.                  PT Education - 07/27/16 [phone]    Education provided Yes   Education Details If dressing gets wet take off and reapply new dressing, If wounds start draining more, swelling more or has increased pain pt should notify MD immediately.    Person(s) Educated Patient   Methods Explanation   Comprehension Verbalized understanding          PT Short Term Goals - 07/27/16 0936      PT SHORT TERM GOAL #1   Title Pt to verbalize signs and symptoms of infection and the need to seek medical help immediately if apparent with any wounds.    Time 1   Period Weeks   Status Achieved     PT SHORT TERM GOAL #2   Title Pt Rt forearm wound to be healed    Time 2   Period Weeks   Status Achieved     PT SHORT TERM GOAL #3   Title Pt to verbalize the importance of moisturizing skin as we age to improve elasticity for increased protection against skin tears.    Time 2   Period Weeks   Status Achieved     PT SHORT  TERM GOAL #4   Title Pt wounds on dorsal aspect of 5th metacarpal to be healed    Time 1   Period Weeks   Status New                  Plan - 07/27/16 0931    Clinical Impression Statement Mr. Hearns is a 80 yo male who injured his Rt forearm and hand in a storm door in July.  He was referred to wound care, had one treatment and then was admitted into the hospital with cellulitis.  He has recently been discharged from the hospital, he is still on oral antibiotics, and is now to resume his outpatient therapy.  At this time the wound on his foream is healed, but he has two small wounds located on the dorsal aspect of his fifth metatarsal.  Due to his severe cellulitis he will be monitored by skilled physical therapy until his wounds are completely healed.    Rehab Potential Good   PT Frequency 2x / week   PT Duration 2 weeks   PT Treatment/Interventions ADLs/Self Care Home Management;Patient/family education   PT Next Visit Plan Pt will most likely be ready for discharge next visit.  Review proper care of wound and see if pt is comfortable with discharge.    Consulted and Agree with Plan of Care Patient      Patient will benefit from skilled therapeutic intervention in order to improve the following deficits and impairments:  Increased edema, Pain, Other (comment) (open wound )  Visit Diagnosis: Laceration of right forearm, subsequent encounter  Pain In Right Arm     Problem List Patient Active Problem List   Diagnosis Date Noted  . Chronic diastolic congestive heart failure ([place]) 07/24/2016  . Cellulitis of right arm 07/20/2016  . Cellulitis and abscess of hand 07/20/2016  . Venous stasis ulcers ([place]) 12/24/2015  . Chronic venous insufficiency 12/24/2015  . Atherosclerosis of extremity with ulceration ([place]) 12/24/2015  . CHF (congestive heart failure) ([place]) 12/24/2015  . Critical lower limb ischemia 12/24/2015  . Obesity 12/24/2015  . PAF (paroxysmal atrial  fibrillation) ([place]) 12/24/2015  . PAD (peripheral artery disease) ([place])  06/07/2014  . COPD (chronic obstructive pulmonary disease) ([place]) 01/23/2013  . Essential hypertension, benign 08/03/2012  . Mixed hyperlipidemia 08/03/2012  . Hx of CABG 08/03/2012   Rayetta Humphrey, PT CLT [phone] 07/27/2016, 9:39 AM  [facility] [address] [place], [place], [address] Phone: [phone]   Fax:  [phone]  Name: John Sampson MRN: [id number] Date of Birth: 06-04-1936

## 2016-07-29 ENCOUNTER — Ambulatory Visit (HOSPITAL_COMMUNITY): Payer: Medicare Other | Admitting: Physical Therapy

## 2016-07-29 DIAGNOSIS — M79601 Pain in right arm: Secondary | ICD-10-CM

## 2016-07-29 DIAGNOSIS — S51811D Laceration without foreign body of right forearm, subsequent encounter: Secondary | ICD-10-CM

## 2016-07-29 NOTE — Therapy (Signed)
John Sampson, [place], [address] Phone: [phone]   Fax:  [phone]  Wound Care Therapy  Patient Details  Name: John Sampson MRN: [id number] Date of Birth: [birth year] Referring Provider: Dr. Sinda Du   Encounter Date: 07/29/2016      PT End of Session - 07/29/16 1012    Visit Number 3   Number of Visits 4   Date for PT Re-Evaluation 08/03/16   Authorization Type medicare   Authorization - Visit Number 3   Authorization - Number of Visits 10   PT Start Time 0945   PT Stop Time 1000  very simple wound    PT Time Calculation (min) 15 min   Activity Tolerance Patient tolerated treatment well   Behavior During Therapy [facility] for tasks assessed/performed      Past Medical History:  Diagnosis Date  . Atrial flutter ([place])   . COPD (chronic obstructive pulmonary disease) ([place]) 3.12.14   2D Echo, EF 50-55%  . Coronary atherosclerosis of native coronary artery    Multivessel status post CABG  . Gastric ulcer    Small - nonbleeding  . GERD (gastroesophageal reflux disease)   . Headache(784.0)   . Iron deficiency anemia    Negative Givens capsule study   . Myocardial infarction ([place])   . PAD (peripheral artery disease) (HCC)    Moderate bilateral SFA disease at angiography 01/2013  . Pituitary macroadenoma [facility])     Past Surgical History:  Procedure Laterality Date  . ABDOMINAL AORTAGRAM N/A 02/19/2013   Procedure: ABDOMINAL Maxcine Ham;  Surgeon: [name], MD;  Location[facility] CATH LAB;  Service: Cardiovascular;  Laterality: N/A;  . CARDIOVERSION N/A 06/16/2015   Procedure: CARDIOVERSION;  Surgeon: Satira Sark, MD;  Location: [facility] ORS;  Service: Cardiovascular;  Laterality: N/A;  . COLONOSCOPY  08/18/2012   Procedure: COLONOSCOPY;  Surgeon: [name], MD;  [facility] ENDO [facility];  Service: Endoscopy;  Laterality: N/A;  1:25  . CORONARY ARTERY BYPASS GRAFT  2003   5 grafts - details not  clear  . CRANIOTOMY  12/31/2011   Procedure: CRANIOTOMY HYPOPHYSECTOMY TRANSNASAL APPROACH;  Surgeon: [name], MD;  Location: [place] NEURO ORS;  Service: Neurosurgery;  Laterality: N/A;  Transphenoidal Hypophysectomy With Fat Graft Harvest from right abdomen   . ESOPHAGOGASTRODUODENOSCOPY  08/18/2012   Procedure: ESOPHAGOGASTRODUODENOSCOPY (EGD);  Surgeon: [name], MD;  [facility];  Service: Endoscopy;  Laterality: N/A;  . EYE SURGERY  2012  . GIVENS CAPSULE STUDY N/A 01/25/2013   Procedure: GIVENS CAPSULE STUDY;  Surgeon: [name], MD;  [facility];  Service: Endoscopy;  Laterality: N/A;  730  . LOWER EXTREMITY ANGIOGRAM N/A 02/19/2013   Procedure: LOWER EXTREMITY ANGIOGRAM;  Surgeon: [name], MD;  Location: [facility] CATH LAB;  Service: Cardiovascular;  Laterality: N/A;  . PERIPHERAL VASCULAR CATHETERIZATION N/A 01/19/2016   Procedure: Abdominal Aortogram;  Surgeon: Conrad [place], MD;  Location: [place] CV LAB;  Service: Cardiovascular;  Laterality: N/A;  . PERIPHERAL VASCULAR CATHETERIZATION Bilateral 01/19/2016   Procedure: Lower Extremity Angiography;  Surgeon: Conrad [place], MD;  Location: [place] CV LAB;  Service: Cardiovascular;  Laterality: Bilateral;  . PV Angiogram  02/19/13   Indications: slow healing left calf ulcer  . Stress Myocardial Perfusion  12/08/2011   Indications: Abnormal EKG, Eval of prior GABG  . TEE WITHOUT CARDIOVERSION N/A 06/16/2015   Procedure: TRANSESOPHAGEAL ECHOCARDIOGRAM (TEE);  Surgeon: Satira Sark, MD;  Location: [facility] ORS;  Service: Cardiovascular;  Laterality: N/A;  . TONSILLECTOMY  Age 74    There were no vitals filed for this visit.         [facility] PT Assessment - 07/29/16 0001      Assessment   Medical Diagnosis Non-healing wound    Referring Provider Dr. Sinda Du    Onset Date/Surgical Date 07/05/16   Hand Dominance Right   Next MD Visit unknown   Prior Therapy not for this condition      Precautions   Precautions None     Restrictions   Weight Bearing Restrictions No     Balance Screen   Has the patient fallen in the past 6 months No   Has the patient had a decrease in activity level because of a fear of falling?  No   Is the patient reluctant to leave their home because of a fear of falling?  No     Prior Function   Level of Independence Independent;Independent with basic ADLs   Vocation Retired                 Wound Therapy - 07/29/16 1007    Subjective Patient arrives stating he is feeling well, he is still on antibiotics but he would like one more skilled session for his wounds.    Patient and Family Stated Goals wound to completely heal    Date of Onset 07/05/16   Prior Treatments one treatment of OP therapy, 5 days of IV antibiotics    Pain Assessment No/denies pain   Evaluation and Treatment Procedures Explained to Patient/Family Yes   Evaluation and Treatment Procedures agreed to   Wound Properties Date First Assessed: 07/27/16 Time First Assessed: 0905 Wound Type: Laceration Location: Hand Location Orientation: Right Wound Description (Comments): Dorsal aspect of 5th meta carpal bone    Dressing Type Gauze (Comment);Impregnated gauze (petrolatum)   Dressing Changed Changed   Dressing Status None   Dressing Change Frequency PRN   Site / Wound Assessment Red   % Wound base Red or Granulating 100%   % Wound base Yellow 0%   Peri-wound Assessment Edema;Erythema (blanchable)   Margins Attached edges (approximated)   Closure None   Drainage Amount None   Treatment Cleansed;Packing (Impregnated strip);Tape changed   Wound Therapy - Clinical Statement Patient arrives today stating he is feeling good; upon removal of dressings, forewarm wounsd are healed however metacarpal wounds are still present but improving. Cleansed and dressed with xeroform and 4x4 gauze, followed by wrapping of hand with small gauze roll and netting. DId not apply tape to skin  secondary to friable skin. While wounds look well, patient requests one more skilled service to monitor wound healing status and ensure full closure with no infection.    Wound Therapy - Functional Problem List non healing wound limiting use of RT UE    Factors Delaying/Impairing Wound Healing Infection - systemic/local;Multiple medical problems;Polypharmacy;Vascular compromise   Wound Therapy - Current Recommendations PT   Dressing  xeroform, 4x4 and gauze with netting to hold dressing in place.                  PT Education - 07/29/16 1011    Education provided Yes   Education Details if dressing comes off, apply neosporin and gauze to area. One more skilled session. Education regarding wound status/POC throughout session.    Person(s) Educated Patient   Methods Explanation   Comprehension  Verbalized understanding          PT Short Term Goals - 07/27/16 0936      PT SHORT TERM GOAL #1   Title Pt to verbalize signs and symptoms of infection and the need to seek medical help immediately if apparent with any wounds.    Time 1   Period Weeks   Status Achieved     PT SHORT TERM GOAL #2   Title Pt Rt forearm wound to be healed    Time 2   Period Weeks   Status Achieved     PT SHORT TERM GOAL #3   Title Pt to verbalize the importance of moisturizing skin as we age to improve elasticity for increased protection against skin tears.    Time 2   Period Weeks   Status Achieved     PT SHORT TERM GOAL #4   Title Pt wounds on dorsal aspect of 5th metacarpal to be healed    Time 1   Period Weeks   Status Ongoing                 Patient will benefit from skilled therapeutic intervention in order to improve the following deficits and impairments:     Visit Diagnosis: Laceration of right forearm, subsequent encounter - Plan: PT plan of care cert/re-cert  Pain In Right Arm - Plan: PT plan of care cert/re-cert     Problem List Patient Active Problem List    Diagnosis Date Noted  . Chronic diastolic congestive heart failure ([place]) 07/24/2016  . Cellulitis of right arm 07/20/2016  . Cellulitis and abscess of hand 07/20/2016  . Venous stasis ulcers ([place]) 12/24/2015  . Chronic venous insufficiency 12/24/2015  . Atherosclerosis of extremity with ulceration ([place]) 12/24/2015  . CHF (congestive heart failure) ([place]) 12/24/2015  . Critical lower limb ischemia 12/24/2015  . Obesity 12/24/2015  . PAF (paroxysmal atrial fibrillation) ([place]) 12/24/2015  . PAD (peripheral artery disease) ([place]) 06/07/2014  . COPD (chronic obstructive pulmonary disease) ([place]) 01/23/2013  . Essential hypertension, benign 08/03/2012  . Mixed hyperlipidemia 08/03/2012  . Hx of CABG 08/03/2012    Deniece Ree PT, DPT [facility] [address] [place], [place], [address] Phone: [phone]   Fax:  [phone]  Name: John Sampson MRN: [id number] Date of Birth: [birth year]

## 2016-08-03 ENCOUNTER — Ambulatory Visit (HOSPITAL_COMMUNITY): Payer: Medicare Other | Attending: Pulmonary Disease | Admitting: Physical Therapy

## 2016-08-03 DIAGNOSIS — X58XXXD Exposure to other specified factors, subsequent encounter: Secondary | ICD-10-CM | POA: Insufficient documentation

## 2016-08-03 DIAGNOSIS — J040 Acute laryngitis: Secondary | ICD-10-CM | POA: Diagnosis not present

## 2016-08-03 DIAGNOSIS — M79601 Pain in right arm: Secondary | ICD-10-CM | POA: Insufficient documentation

## 2016-08-03 DIAGNOSIS — S51811D Laceration without foreign body of right forearm, subsequent encounter: Secondary | ICD-10-CM | POA: Insufficient documentation

## 2016-08-03 NOTE — Therapy (Signed)
[place], [place], [address] Phone: [phone]   Fax:  [phone]  Wound Care Therapy  Patient Details  Name: John Sampson MRN: [id number] Date of Birth: March 09, 1936 Referring Provider: Dr. Sinda Du   Encounter Date: 08/03/2016      PT End of Session - 08/03/16 1106    Visit Number 4   Number of Visits 4   Date for PT Re-Evaluation 08/03/16   Authorization Type medicare   Authorization - Visit Number 4   Authorization - Number of Visits 4   PT Start Time [id number]   PT Stop Time 1053   PT Time Calculation (min) 15 min   Activity Tolerance Patient tolerated treatment well   Behavior During Therapy [facility] for tasks assessed/performed      Past Medical History:  Diagnosis Date  . Atrial flutter ([place])   . COPD (chronic obstructive pulmonary disease) ([place]) 3.12.14   2D Echo, EF 50-55%  . Coronary atherosclerosis of native coronary artery    Multivessel status post CABG  . Gastric ulcer    Small - nonbleeding  . GERD (gastroesophageal reflux disease)   . Headache(784.0)   . Iron deficiency anemia    Negative Givens capsule study   . Myocardial infarction ([place])   . PAD (peripheral artery disease) (H[facility])    Moderate bilateral SFA disease at angiography 01/2013  . Pituitary macroadenoma [facility])     Past Surgical History:  Procedure Laterality Date  . ABDOMINAL AORTAGRAM N/A 02/19/2013   Procedure: ABDOMINAL Maxcine Ham;  Surgeon: [name], MD;  Location: [facility] CATH LAB;  Service: Cardiovascular;  Laterality: N/A;  . CARDIOVERSION N/A 06/16/2015   Procedure: CARDIOVERSION;  Surgeon: Satira Sark, MD;  Location: [facility] ORS;  Service: Cardiovascular;  Laterality: N/A;  . COLONOSCOPY  08/18/2012   Procedure: COLONOSCOPY;  Surgeon: [name], MD;  Location: [facility] ENDO SUITE;  Service: Endoscopy;  Laterality: N/A;  1:25  . CORONARY ARTERY BYPASS GRAFT  2003   5 grafts - details not clear  . CRANIOTOMY   12/31/2011   Procedure: CRANIOTOMY HYPOPHYSECTOMY TRANSNASAL APPROACH;  Surgeon: [name], MD;  Location: [place] NEURO ORS;  Service: Neurosurgery;  Laterality: N/A;  Transphenoidal Hypophysectomy With Fat Graft Harvest from right abdomen   . ESOPHAGOGASTRODUODENOSCOPY  08/18/2012   Procedure: ESOPHAGOGASTRODUODENOSCOPY (EGD);  Surgeon: [name], MD;  Location: [facility] ENDO SUITE;  Service: Endoscopy;  Laterality: N/A;  . EYE SURGERY  2012  . GIVENS CAPSULE STUDY N/A 01/25/2013   Procedure: GIVENS CAPSULE STUDY;  Surgeon: [name], MD;  Location: [facility] ENDO SUITE;  Service: Endoscopy;  Laterality: N/A;  730  . LOWER EXTREMITY ANGIOGRAM N/A 02/19/2013   Procedure: LOWER EXTREMITY ANGIOGRAM;  Surgeon: [name], MD;  Location: [facility] CATH LAB;  Service: Cardiovascular;  Laterality: N/A;  . PERIPHERAL VASCULAR CATHETERIZATION N/A 01/19/2016   Procedure: Abdominal Aortogram;  Surgeon: Conrad Moravia, MD;  Location: [place] CV LAB;  Service: Cardiovascular;  Laterality: N/A;  . PERIPHERAL VASCULAR CATHETERIZATION Bilateral 01/19/2016   Procedure: Lower Extremity Angiography;  Surgeon: Conrad Glidden, MD;  Location: [place] CV LAB;  Service: Cardiovascular;  Laterality: Bilateral;  . PV Angiogram  02/19/13   Indications: slow healing left calf ulcer  . Stress Myocardial Perfusion  12/08/2011   Indications: Abnormal EKG, Eval of prior GABG  . TEE WITHOUT CARDIOVERSION N/A 06/16/2015   Procedure: TRANSESOPHAGEAL ECHOCARDIOGRAM (TEE);  Surgeon: Satira Sark, MD;  Location: [facility] ORS;  Service: Cardiovascular;  Laterality: N/A;  . TONSILLECTOMY  Age 41    There were no vitals filed for this visit.                  Wound Therapy - 08/03/16 1101    Subjective Pt states that he has no pain in his arm.l  Feels he is ready for discharge.    Patient and Family Stated Goals wound to completely heal    Date of Onset 07/05/16   Prior Treatments one treatment of OP therapy, 5 days of IV  antibiotics    Pain Assessment No/denies pain   Evaluation and Treatment Procedures Explained to Patient/Family Yes   Evaluation and Treatment Procedures agreed to   Wound Properties Date First Assessed: 07/27/16 Time First Assessed: 0905 Wound Type: Laceration Location: Hand Location Orientation: Right Wound Description (Comments): Dorsal aspect of 5th meta carpal bone  Final Assessment Date: 08/03/16 Final Assessment Time: 1039   Dressing Type Gauze (Comment);Impregnated gauze (petrolatum)   Dressing Changed Changed   Dressing Status None   Dressing Change Frequency PRN   % Wound base Yellow 0%   Peri-wound Assessment --   Margins Attached edges (approximated)   Closure None   Drainage Amount None   Wound Therapy - Clinical Statement Pt no longer has any edema or increased redness.  Wound has healed.  Pt wound site cleansed and debrided of dead skin.  Pt was educated about the importance of moisturizing skin everyday as we get older.     Wound Therapy - Functional Problem List non healing wound limiting use of RT UE    Factors Delaying/Impairing Wound Healing --   Wound Therapy - Current Recommendations PT   Dressing  xeroform, 4x4 and gauze with netting to hold dressing in place.   to pad new skin.  Pt educated to take dressing off tomorrow                  PT Education - 08/03/16 1105    Education provided Yes   Education Details The importance of moisturizing skin as we get older due to decreased elasticity of skin.   Person(s) Educated Patient   Methods Explanation   Comprehension Verbalized understanding          PT Short Term Goals - 08/03/16 1107      PT SHORT TERM GOAL #1   Title Pt to verbalize signs and symptoms of infection and the need to seek medical help immediately if apparent with any wounds.    Time 1   Period Weeks   Status Achieved     PT SHORT TERM GOAL #2   Title Pt Rt forearm wound to be healed    Time 2   Period Weeks   Status Achieved      PT SHORT TERM GOAL #3   Title Pt to verbalize the importance of moisturizing skin as we age to improve elasticity for increased protection against skin tears.    Time 2   Period Weeks   Status Achieved     PT SHORT TERM GOAL #4   Title Pt wounds on dorsal aspect of 5th metacarpal to be healed    Time 1   Period Weeks   Status Achieved                  Plan - 08/03/16 1107    Clinical Impression Statement Pt is ready to be discharged  Rehab Potential Good   PT Frequency 2x / week   PT Duration 2 weeks   PT Treatment/Interventions ADLs/Self Care Home Management;Patient/family education   PT Next Visit Plan discharge pt    Consulted and Agree with Plan of Care Patient      Patient will benefit from skilled therapeutic intervention in order to improve the following deficits and impairments:  Increased edema, Pain, Other (comment) (open wound )  Visit Diagnosis: Laceration of right forearm, subsequent encounter  Pain In Right Arm      G-Codes - August 18, 2016 1108    Functional Limitation Other PT primary   Mobility: Walking and Moving Around Goal Status [id number]) At least 1 percent but less than 20 percent impaired, limited or restricted   Mobility: Walking and Moving Around Discharge Status [phone]) At least 1 percent but less than 20 percent impaired, limited or restricted       Problem List Patient Active Problem List   Diagnosis Date Noted  . Chronic diastolic congestive heart failure ([place]) 07/24/2016  . Cellulitis of right arm 07/20/2016  . Cellulitis and abscess of hand 07/20/2016  . Venous stasis ulcers ([place]) 12/24/2015  . Chronic venous insufficiency 12/24/2015  . Atherosclerosis of extremity with ulceration ([place]) 12/24/2015  . CHF (congestive heart failure) ([place]) 12/24/2015  . Critical lower limb ischemia 12/24/2015  . Obesity 12/24/2015  . PAF (paroxysmal atrial fibrillation) ([place]) 12/24/2015  . PAD (peripheral artery disease) ([place]) 06/07/2014  .  COPD (chronic obstructive pulmonary disease) ([place]) 01/23/2013  . Essential hypertension, benign Aug 18, 2012  . Mixed hyperlipidemia 08/18/2012  . Hx of CABG 08/18/12    Rayetta Humphrey, PT CLT [phone] Aug 18, 2016, 11:08 AM  [place] [address] [place], [place], [address] Phone: [phone]   Fax:  [phone]  Name: GRIFFON LEYENDECKER MRN: [id number] Date of Birth: 05/29/1936

## 2016-08-06 ENCOUNTER — Ambulatory Visit (HOSPITAL_COMMUNITY): Payer: Medicare Other

## 2016-08-10 ENCOUNTER — Ambulatory Visit (HOSPITAL_COMMUNITY): Payer: Medicare Other | Admitting: Physical Therapy

## 2016-08-10 DIAGNOSIS — E1151 Type 2 diabetes mellitus with diabetic peripheral angiopathy without gangrene: Secondary | ICD-10-CM | POA: Diagnosis not present

## 2016-08-10 DIAGNOSIS — E1140 Type 2 diabetes mellitus with diabetic neuropathy, unspecified: Secondary | ICD-10-CM | POA: Diagnosis not present

## 2016-08-11 DIAGNOSIS — R490 Dysphonia: Secondary | ICD-10-CM | POA: Diagnosis not present

## 2016-08-12 ENCOUNTER — Ambulatory Visit (HOSPITAL_COMMUNITY): Payer: Medicare Other | Admitting: Physical Therapy

## 2016-08-16 ENCOUNTER — Ambulatory Visit (HOSPITAL_COMMUNITY): Payer: Medicare Other | Admitting: Physical Therapy

## 2016-08-18 ENCOUNTER — Other Ambulatory Visit (HOSPITAL_COMMUNITY): Payer: Self-pay | Admitting: Otolaryngology

## 2016-08-19 ENCOUNTER — Ambulatory Visit (HOSPITAL_COMMUNITY): Payer: Medicare Other | Admitting: Physical Therapy

## 2016-08-20 ENCOUNTER — Other Ambulatory Visit (HOSPITAL_COMMUNITY): Payer: Self-pay | Admitting: Otolaryngology

## 2016-08-20 DIAGNOSIS — R498 Other voice and resonance disorders: Secondary | ICD-10-CM

## 2016-08-20 DIAGNOSIS — R490 Dysphonia: Secondary | ICD-10-CM

## 2016-08-23 ENCOUNTER — Emergency Department (HOSPITAL_COMMUNITY): Payer: Medicare Other

## 2016-08-23 ENCOUNTER — Inpatient Hospital Stay (HOSPITAL_COMMUNITY)
Admission: EM | Admit: 2016-08-23 | Discharge: 2016-08-27 | DRG: 190 | Disposition: A | Discharge status: Home / Self Care | Payer: Medicare Other | Attending: Pulmonary Disease | Admitting: Pulmonary Disease

## 2016-08-23 ENCOUNTER — Ambulatory Visit (HOSPITAL_COMMUNITY): Payer: Medicare Other | Admitting: Physical Therapy

## 2016-08-23 ENCOUNTER — Encounter (HOSPITAL_COMMUNITY): Payer: Self-pay | Admitting: Emergency Medicine

## 2016-08-23 DIAGNOSIS — R490 Dysphonia: Secondary | ICD-10-CM

## 2016-08-23 DIAGNOSIS — Z87891 Personal history of nicotine dependence: Secondary | ICD-10-CM | POA: Diagnosis not present

## 2016-08-23 DIAGNOSIS — J189 Pneumonia, unspecified organism: Secondary | ICD-10-CM | POA: Diagnosis present

## 2016-08-23 DIAGNOSIS — I10 Essential (primary) hypertension: Secondary | ICD-10-CM | POA: Diagnosis present

## 2016-08-23 DIAGNOSIS — Z79899 Other long term (current) drug therapy: Secondary | ICD-10-CM

## 2016-08-23 DIAGNOSIS — I252 Old myocardial infarction: Secondary | ICD-10-CM

## 2016-08-23 DIAGNOSIS — J449 Chronic obstructive pulmonary disease, unspecified: Secondary | ICD-10-CM | POA: Diagnosis present

## 2016-08-23 DIAGNOSIS — R05 Cough: Secondary | ICD-10-CM | POA: Diagnosis not present

## 2016-08-23 DIAGNOSIS — K219 Gastro-esophageal reflux disease without esophagitis: Secondary | ICD-10-CM | POA: Diagnosis not present

## 2016-08-23 DIAGNOSIS — R918 Other nonspecific abnormal finding of lung field: Secondary | ICD-10-CM | POA: Diagnosis present

## 2016-08-23 DIAGNOSIS — J440 Chronic obstructive pulmonary disease with acute lower respiratory infection: Principal | ICD-10-CM | POA: Diagnosis present

## 2016-08-23 DIAGNOSIS — Z7901 Long term (current) use of anticoagulants: Secondary | ICD-10-CM

## 2016-08-23 DIAGNOSIS — R042 Hemoptysis: Secondary | ICD-10-CM | POA: Diagnosis not present

## 2016-08-23 DIAGNOSIS — I2510 Atherosclerotic heart disease of native coronary artery without angina pectoris: Secondary | ICD-10-CM | POA: Diagnosis present

## 2016-08-23 DIAGNOSIS — Z8249 Family history of ischemic heart disease and other diseases of the circulatory system: Secondary | ICD-10-CM

## 2016-08-23 DIAGNOSIS — Z951 Presence of aortocoronary bypass graft: Secondary | ICD-10-CM

## 2016-08-23 DIAGNOSIS — I480 Paroxysmal atrial fibrillation: Secondary | ICD-10-CM | POA: Diagnosis present

## 2016-08-23 DIAGNOSIS — I739 Peripheral vascular disease, unspecified: Secondary | ICD-10-CM | POA: Diagnosis present

## 2016-08-23 DIAGNOSIS — R498 Other voice and resonance disorders: Secondary | ICD-10-CM

## 2016-08-23 DIAGNOSIS — Y95 Nosocomial condition: Secondary | ICD-10-CM | POA: Diagnosis present

## 2016-08-23 LAB — CBC
HCT: 36.9 % — ABNORMAL LOW (ref 39.0–52.0)
HEMOGLOBIN: 11.5 g/dL — AB (ref 13.0–17.0)
MCH: 29.3 pg (ref 26.0–34.0)
MCHC: 31.2 g/dL (ref 30.0–36.0)
MCV: 93.9 fL (ref 78.0–100.0)
PLATELETS: 269 10*3/uL (ref 150–400)
RBC: 3.93 MIL/uL — AB (ref 4.22–5.81)
RDW: 16 % — ABNORMAL HIGH (ref 11.5–15.5)
WBC: 11.3 10*3/uL — AB (ref 4.0–10.5)

## 2016-08-23 LAB — BASIC METABOLIC PANEL
ANION GAP: 7 (ref 5–15)
BUN: 15 mg/dL (ref 6–20)
CALCIUM: 9.3 mg/dL (ref 8.9–10.3)
CO2: 23 mmol/L (ref 22–32)
CREATININE: 0.89 mg/dL (ref 0.61–1.24)
Chloride: 106 mmol/L (ref 101–111)
GFR calc non Af Amer: >60 mL/min (ref >60)
Glucose, Bld: 91 mg/dL (ref 65–99)
Potassium: 3.6 mmol/L (ref 3.5–5.1)
SODIUM: 136 mmol/L (ref 135–145)

## 2016-08-23 LAB — I-STAT TROPONIN, ED: TROPONIN I, POC: 0 ng/mL (ref 0.00–0.08)

## 2016-08-23 MED ORDER — PIPERACILLIN-TAZOBACTAM 3.375 G IVPB 30 MIN
3.3750 g | Freq: Once | INTRAVENOUS | Status: AC
  Administered 2016-08-23: 3.375 g via INTRAVENOUS
  Filled 2016-08-23: qty 50

## 2016-08-23 MED ORDER — IOPAMIDOL (ISOVUE-370) INJECTION 76%
100.0000 mL | Freq: Once | INTRAVENOUS | Status: AC | PRN
  Administered 2016-08-23: 100 mL via INTRAVENOUS

## 2016-08-23 MED ORDER — VANCOMYCIN HCL IN DEXTROSE 1-5 GM/200ML-% IV SOLN
1000.0000 mg | Freq: Once | INTRAVENOUS | Status: AC
  Administered 2016-08-23: 1000 mg via INTRAVENOUS
  Filled 2016-08-23: qty 200

## 2016-08-23 NOTE — ED Notes (Signed)
Pt sob in room but pt states it's due to his COPD, pt c/o fatigue like as before when his HGB dropped to 5.  Pt denies fevers, N/V or diarrhea.  Pt had received 3 units of blood at [place], [place] back in April/May of this year.  Pt with recent admission here due to cellulitis to right hand and became hoarse and never got better.  ENT has scheduled pt with CT of neck due to hoarseness.

## 2016-08-23 NOTE — ED Notes (Signed)
Patient transported to CT 

## 2016-08-23 NOTE — ED Provider Notes (Signed)
[facility] DEPT Provider Note   CSN: [id number] Arrival date & time: 08/23/16  1610     History   Chief Complaint Chief Complaint  Patient presents with  . Hematemesis    HPI John Sampson is a 80 y.o. male.  Patient complains of feeling weak since Saturday and also coughing up blood   The history is provided by the patient. No language interpreter was used.  Weakness  Primary symptoms include no focal weakness. This is a recurrent problem. The current episode started more than 2 days ago. The problem has not changed since onset.There was no focality noted. There has been no fever. Pertinent negatives include no shortness of breath, no chest pain and no headaches. There were no medications administered prior to arrival. Associated medical issues do not include trauma.    Past Medical History:  Diagnosis Date  . Atrial flutter ([place])   . COPD (chronic obstructive pulmonary disease) ([place]) 3.12.14   2D Echo, EF 50-55%  . Coronary atherosclerosis of native coronary artery    Multivessel status post CABG  . Gastric ulcer    Small - nonbleeding  . GERD (gastroesophageal reflux disease)   . Headache(784.0)   . Iron deficiency anemia    Negative Givens capsule study   . Myocardial infarction ([place])   . PAD (peripheral artery disease) (H[facility])    Moderate bilateral SFA disease at angiography 01/2013  . Pituitary macroadenoma [facility])     Patient Active Problem List   Diagnosis Date Noted  . Chronic diastolic congestive heart failure ([place]) 07/24/2016  . Cellulitis of right arm 07/20/2016  . Cellulitis and abscess of hand 07/20/2016  . Venous stasis ulcers ([place]) 12/24/2015  . Chronic venous insufficiency 12/24/2015  . Atherosclerosis of extremity with ulceration ([place]) 12/24/2015  . CHF (congestive heart failure) ([place]) 12/24/2015  . Critical lower limb ischemia 12/24/2015  . Obesity 12/24/2015  . PAF (paroxysmal atrial fibrillation) ([place]) 12/24/2015  . PAD (peripheral artery  disease) ([place]) 06/07/2014  . COPD (chronic obstructive pulmonary disease) ([place]) 01/23/2013  . Essential hypertension, benign 08/03/2012  . Mixed hyperlipidemia 08/03/2012  . Hx of CABG 08/03/2012    Past Surgical History:  Procedure Laterality Date  . ABDOMINAL AORTAGRAM N/A 02/19/2013   Procedure: ABDOMINAL Maxcine Ham;  Surgeon: [name], MD;  Location: [facility] CATH LAB;  Service: Cardiovascular;  Laterality: N/A;  . CARDIOVERSION N/A 06/16/2015   Procedure: CARDIOVERSION;  Surgeon: Satira Sark, MD;  Location: [facility] ORS;  Service: Cardiovascular;  Laterality: N/A;  . COLONOSCOPY  08/18/2012   Procedure: COLONOSCOPY;  Surgeon: [name], MD;  Location[facility] ENDO SUITE;  Service: Endoscopy;  Laterality: N/A;  1:25  . CORONARY ARTERY BYPASS GRAFT  2003   5 grafts - details not clear  . CRANIOTOMY  12/31/2011   Procedure: CRANIOTOMY HYPOPHYSECTOMY TRANSNASAL APPROACH;  Surgeon: [name], MD;  Location: [facility] NEURO ORS;  Service: Neurosurgery;  Laterality: N/A;  Transphenoidal Hypophysectomy With Fat Graft Harvest from right abdomen   . ESOPHAGOGASTRODUODENOSCOPY  08/18/2012   Procedure: ESOPHAGOGASTRODUODENOSCOPY (EGD);  Surgeon: [name], MD;  [facility] END[facility];  Service: Endoscopy;  Laterality: N/A;  . EYE SURGERY  2012  . GIVENS CAPSULE STUDY N/A 01/25/2013   Procedure: GIVENS CAPSULE STUDY;  Surgeon: [name], MD;  [facility] ENDO [facility];  Service: Endoscopy;  Laterality: N/A;  730  . LOWER EXTREMITY ANGIOGRAM N/A 02/19/2013   Procedure: LOWER EXTREMITY ANGIOGRAM;  Surgeon: [name], MD;  Location:  [facility] CATH LAB;  Service: Cardiovascular;  Laterality: N/A;  . PERIPHERAL VASCULAR CATHETERIZATION N/A 01/19/2016   Procedure: Abdominal Aortogram;  Surgeon: Conrad Unionville, MD;  Location: [facility] CV LAB;  Service: Cardiovascular;  Laterality: N/A;  . PERIPHERAL VASCULAR CATHETERIZATION Bilateral 01/19/2016   Procedure: Lower Extremity Angiography;  Surgeon: Conrad Union, MD;  Location: [facility] CV LAB;  Service: Cardiovascular;  Laterality: Bilateral;  . PV Angiogram  02/19/13   Indications: slow healing left calf ulcer  . Stress Myocardial Perfusion  12/08/2011   Indications: Abnormal EKG, Eval of prior GABG  . TEE WITHOUT CARDIOVERSION N/A 06/16/2015   Procedure: TRANSESOPHAGEAL ECHOCARDIOGRAM (TEE);  Surgeon: Satira Sark, MD;  Location: [facility] ORS;  Service: Cardiovascular;  Laterality: N/A;  . TONSILLECTOMY  Age 25       Home Medications    Prior to Admission medications   Medication Sig Start Date End Date Taking? Authorizing Provider  acetaminophen (TYLENOL) 325 MG tablet Take 650 mg by mouth daily as needed for headache.   Yes Historical Provider, MD  albuterol (PROVENTIL HFA) 108 (90 BASE) MCG/ACT inhaler Inhale 2 puffs into the lungs every 6 (six) hours as needed for wheezing or shortness of breath. 11/15/14  Yes Satira Sark, MD  apixaban (ELIQUIS) 5 MG TABS tablet Take 1 tablet (5 mg total) by mouth 2 (two) times daily. 12/24/15  Yes Luke K Kilroy, PA-C  diltiazem (CARDIZEM CD) 240 MG 24 hr capsule Take 1 capsule (240 mg total) by mouth daily. 05/30/15  Yes Lendon Colonel, NP  docusate sodium (COLACE) 100 MG capsule Take 200 mg by mouth at bedtime.   Yes Historical Provider, MD  ferrous sulfate 325 (65 FE) MG tablet Take 1 tablet (325 mg total) by mouth 2 (two) times daily with a meal. 01/23/13  Yes [name], MD  fish oil-omega-3 fatty acids 1000 MG capsule Take 1 g by mouth 2 (two) times daily.    Yes Historical Provider, MD  folic acid (FOLVITE) 1 MG tablet Take 1 mg by mouth daily.    Yes Historical Provider, MD  furosemide (LASIX) 20 MG tablet Take 1 tablet (20 mg total) by mouth daily. 03/01/16  Yes Satira Sark, MD  lisinopril (PRINIVIL,ZESTRIL) 10 MG tablet Take 1 tablet (10 mg total) by mouth daily. 05/30/15  Yes Lendon Colonel, NP  metoprolol succinate (TOPROL-XL) 50 MG 24 hr tablet Take 50 mg by mouth 2 (two)  times daily. Take with or immediately following a meal.   Yes Historical Provider, MD  omeprazole (PRILOSEC) 20 MG capsule Take 20 mg by mouth 2 (two) times daily before a meal.   Yes Historical Provider, MD  potassium chloride SA (K-DUR,KLOR-CON) 20 MEQ tablet Take 20 mEq by mouth daily.   Yes Historical Provider, MD  simvastatin (ZOCOR) 40 MG tablet Take 0.5 tablets (20 mg total) by mouth at bedtime. 07/25/16  Yes Sinda Du, MD  doxycycline (VIBRA-TABS) 100 MG tablet Take 1 tablet (100 mg total) by mouth every 12 (twelve) hours. Patient not taking: Reported on 08/23/2016 07/25/16   Sinda Du, MD  nitroGLYCERIN (NITROSTAT) 0.4 MG SL tablet Place 1 tablet (0.4 mg total) under the tongue every 5 (five) minutes as needed for chest pain. 06/07/14   Satira Sark, MD    Family History Family History  Problem Relation Age of Onset  . CAD Mother   . Heart disease Father     before age 83  Social History Social History  Substance Use Topics  . Smoking status: Former Smoker    Packs/day: 3.00    Years: 45.00    Types: Cigarettes    Start date: 01/24/1955    Quit date: 01/23/2002  . Smokeless tobacco: Never Used     Comment: Quit 11 yrs ago  . Alcohol use No     Allergies   Review of patient's allergies indicates no known allergies.   Review of Systems Review of Systems  Constitutional: Negative for appetite change and fatigue.  HENT: Negative for congestion, ear discharge and sinus pressure.   Eyes: Negative for discharge.  Respiratory: Negative for cough and shortness of breath.   Cardiovascular: Negative for chest pain.  Gastrointestinal: Negative for abdominal pain and diarrhea.  Genitourinary: Negative for frequency and hematuria.  Musculoskeletal: Negative for back pain.  Skin: Negative for rash.  Neurological: Positive for weakness. Negative for focal weakness, seizures and headaches.  Psychiatric/Behavioral: Negative for hallucinations.     Physical  Exam Updated Vital Signs BP 171/65   Pulse 61   Temp 98.1 F (36.7 C) (Oral)   Resp 16   Ht 5\' 9"  (1.753 m)   Wt 215 lb (97.5 kg)   SpO2 96%   BMI 31.75 kg/m   Physical Exam  Constitutional: He is oriented to person, place, and time. He appears well-developed.  HENT:  Head: Normocephalic.  Eyes: Conjunctivae and EOM are normal. No scleral icterus.  Neck: Neck supple. No thyromegaly present.  Cardiovascular: Normal rate and regular rhythm.  Exam reveals no gallop and no friction rub.   No murmur heard. Pulmonary/Chest: No stridor. He has no wheezes. He has no rales. He exhibits no tenderness.  Abdominal: He exhibits no distension. There is no tenderness. There is no rebound.  Musculoskeletal: Normal range of motion. He exhibits no edema.  Lymphadenopathy:    He has no cervical adenopathy.  Neurological: He is oriented to person, place, and time. He exhibits normal muscle tone. Coordination normal.  Skin: No rash noted. No erythema.  Psychiatric: He has a normal mood and affect. His behavior is normal.     ED Treatments / Results  Labs (all labs ordered are listed, but only abnormal results are displayed) Labs Reviewed  CBC - Abnormal; Notable for the following:       Result Value   WBC 11.3 (*)    RBC 3.93 (*)    Hemoglobin 11.5 (*)    HCT 36.9 (*)    RDW 16.0 (*)    All other components within normal limits  BASIC METABOLIC PANEL  I-STAT TROPOININ, ED    EKG  EKG Interpretation None       Radiology Dg Chest 2 View  Result Date: 08/23/2016 CLINICAL DATA:  Coughing up bloody sputum for 4 days. Generalized weakness. EXAM: CHEST  2 VIEW COMPARISON:  02/14/2013 FINDINGS: Mildly elevated right hemidiaphragm. Prior CABG. Mild enlargement of the cardiopericardial silhouette. Atherosclerotic calcification of the aortic arch. Mild upper zone vascular prominence without overt edema. No pleural effusion. Bony demineralization. IMPRESSION: 1. Mild enlargement of the  cardiopericardial silhouette with suspected pulmonary venous hypertension but no overt edema. 2. Elevated right hemidiaphragm. 3. Prior CABG. 4. Atherosclerotic calcification of the aortic arch. 5. If the patient is experiencing true hemoptysis, chest CT may be warranted. Electronically Signed   By: Van Clines M.D.   On: 08/23/2016 20:14    Procedures Procedures (including critical care time)  Medications Ordered in ED Medications  iopamidol (  ISOVUE-370) 76 % injection 100 mL (100 mLs Intravenous Contrast Given 08/23/16 2136)     Initial Impression / Assessment and Plan / ED Course  I have reviewed the triage vital signs and the nursing notes.  Pertinent labs & imaging results that were available during my care of the patient were reviewed by me and considered in my medical decision making (see chart for details).  Clinical Course    CT scan shows no PE with possible left lower lobe pneumonia. Patient will be admitted for pneumonia  Final Clinical Impressions(s) / ED Diagnoses   Final diagnoses:  None    New Prescriptions New Prescriptions   No medications on file     Milton Ferguson, MD 08/23/16 2216

## 2016-08-23 NOTE — ED Triage Notes (Signed)
PT stated he has been spitting up blood x4 days and felt generalized weakness. PT states recent hospitalization for abscess to right hand. PT states "I think my blood is low"

## 2016-08-23 NOTE — ED Notes (Signed)
MD at bedside. 

## 2016-08-24 DIAGNOSIS — Z79899 Other long term (current) drug therapy: Secondary | ICD-10-CM | POA: Diagnosis not present

## 2016-08-24 DIAGNOSIS — I252 Old myocardial infarction: Secondary | ICD-10-CM | POA: Diagnosis not present

## 2016-08-24 DIAGNOSIS — I480 Paroxysmal atrial fibrillation: Secondary | ICD-10-CM

## 2016-08-24 DIAGNOSIS — R918 Other nonspecific abnormal finding of lung field: Secondary | ICD-10-CM | POA: Diagnosis present

## 2016-08-24 DIAGNOSIS — Z87891 Personal history of nicotine dependence: Secondary | ICD-10-CM | POA: Diagnosis not present

## 2016-08-24 DIAGNOSIS — K219 Gastro-esophageal reflux disease without esophagitis: Secondary | ICD-10-CM | POA: Diagnosis present

## 2016-08-24 DIAGNOSIS — I2510 Atherosclerotic heart disease of native coronary artery without angina pectoris: Secondary | ICD-10-CM | POA: Diagnosis present

## 2016-08-24 DIAGNOSIS — J189 Pneumonia, unspecified organism: Secondary | ICD-10-CM | POA: Diagnosis present

## 2016-08-24 DIAGNOSIS — I10 Essential (primary) hypertension: Secondary | ICD-10-CM

## 2016-08-24 DIAGNOSIS — Z951 Presence of aortocoronary bypass graft: Secondary | ICD-10-CM | POA: Diagnosis not present

## 2016-08-24 DIAGNOSIS — I739 Peripheral vascular disease, unspecified: Secondary | ICD-10-CM | POA: Diagnosis present

## 2016-08-24 DIAGNOSIS — J449 Chronic obstructive pulmonary disease, unspecified: Secondary | ICD-10-CM | POA: Diagnosis not present

## 2016-08-24 DIAGNOSIS — Z8249 Family history of ischemic heart disease and other diseases of the circulatory system: Secondary | ICD-10-CM | POA: Diagnosis not present

## 2016-08-24 DIAGNOSIS — J438 Other emphysema: Secondary | ICD-10-CM

## 2016-08-24 DIAGNOSIS — Y95 Nosocomial condition: Secondary | ICD-10-CM | POA: Diagnosis present

## 2016-08-24 DIAGNOSIS — J440 Chronic obstructive pulmonary disease with acute lower respiratory infection: Secondary | ICD-10-CM | POA: Diagnosis present

## 2016-08-24 DIAGNOSIS — I4891 Unspecified atrial fibrillation: Secondary | ICD-10-CM | POA: Diagnosis not present

## 2016-08-24 DIAGNOSIS — R221 Localized swelling, mass and lump, neck: Secondary | ICD-10-CM | POA: Diagnosis not present

## 2016-08-24 DIAGNOSIS — Z7901 Long term (current) use of anticoagulants: Secondary | ICD-10-CM | POA: Diagnosis not present

## 2016-08-24 LAB — COMPREHENSIVE METABOLIC PANEL
ALBUMIN: 3.7 g/dL (ref 3.5–5.0)
ALT: 16 U/L — ABNORMAL LOW (ref 17–63)
ANION GAP: 7 (ref 5–15)
AST: 13 U/L — ABNORMAL LOW (ref 15–41)
Alkaline Phosphatase: 53 U/L (ref 38–126)
BUN: 15 mg/dL (ref 6–20)
CO2: 23 mmol/L (ref 22–32)
Calcium: 9.1 mg/dL (ref 8.9–10.3)
Chloride: 106 mmol/L (ref 101–111)
Creatinine, Ser: 0.85 mg/dL (ref 0.61–1.24)
GFR calc Af Amer: >60 mL/min (ref >60)
GFR calc non Af Amer: >60 mL/min (ref >60)
GLUCOSE: 105 mg/dL — AB (ref 65–99)
POTASSIUM: 3.6 mmol/L (ref 3.5–5.1)
SODIUM: 136 mmol/L (ref 135–145)
TOTAL PROTEIN: 6.7 g/dL (ref 6.5–8.1)
Total Bilirubin: 0.6 mg/dL (ref 0.3–1.2)

## 2016-08-24 LAB — EXPECTORATED SPUTUM ASSESSMENT W REFEX TO RESP CULTURE

## 2016-08-24 LAB — CBC
HCT: 35.7 % — ABNORMAL LOW (ref 39.0–52.0)
Hemoglobin: 11.1 g/dL — ABNORMAL LOW (ref 13.0–17.0)
MCH: 29.1 pg (ref 26.0–34.0)
MCHC: 31.1 g/dL (ref 30.0–36.0)
MCV: 93.7 fL (ref 78.0–100.0)
Platelets: 284 10*3/uL (ref 150–400)
RBC: 3.81 MIL/uL — ABNORMAL LOW (ref 4.22–5.81)
RDW: 16 % — AB (ref 11.5–15.5)
WBC: 11.8 10*3/uL — ABNORMAL HIGH (ref 4.0–10.5)

## 2016-08-24 LAB — STREP PNEUMONIAE URINARY ANTIGEN: STREP PNEUMO URINARY ANTIGEN: NEGATIVE

## 2016-08-24 LAB — EXPECTORATED SPUTUM ASSESSMENT W GRAM STAIN, RFLX TO RESP C

## 2016-08-24 MED ORDER — OMEGA-3 FATTY ACIDS 1000 MG PO CAPS: 1.0000 g | ORAL_CAPSULE | Freq: Two times a day (BID) | ORAL | Status: DC

## 2016-08-24 MED ORDER — VANCOMYCIN HCL 10 G IV SOLR
1250.0000 mg | Freq: Two times a day (BID) | INTRAVENOUS | Status: DC
  Administered 2016-08-24 – 2016-08-26 (×6): 1250 mg via INTRAVENOUS
  Filled 2016-08-24 (×7): qty 1250

## 2016-08-24 MED ORDER — CEFEPIME HCL 1 G IJ SOLR
1.0000 g | Freq: Three times a day (TID) | INTRAMUSCULAR | Status: DC
  Administered 2016-08-24 – 2016-08-27 (×10): 1 g via INTRAVENOUS
  Filled 2016-08-24 (×20): qty 1

## 2016-08-24 MED ORDER — IPRATROPIUM-ALBUTEROL 0.5-2.5 (3) MG/3ML IN SOLN
3.0000 mL | Freq: Four times a day (QID) | RESPIRATORY_TRACT | Status: DC
  Administered 2016-08-24 (×3): 3 mL via RESPIRATORY_TRACT
  Filled 2016-08-24 (×2): qty 3

## 2016-08-24 MED ORDER — OMEGA-3-ACID ETHYL ESTERS 1 G PO CAPS
1.0000 g | ORAL_CAPSULE | Freq: Two times a day (BID) | ORAL | Status: DC
  Administered 2016-08-24 – 2016-08-27 (×8): 1 g via ORAL
  Filled 2016-08-24 (×8): qty 1

## 2016-08-24 MED ORDER — LISINOPRIL 10 MG PO TABS
10.0000 mg | ORAL_TABLET | Freq: Every day | ORAL | Status: DC
  Administered 2016-08-24 – 2016-08-27 (×4): 10 mg via ORAL
  Filled 2016-08-24 (×4): qty 1

## 2016-08-24 MED ORDER — DEXTROSE 5 % IV SOLN
INTRAVENOUS | Status: AC
  Filled 2016-08-24: qty 1

## 2016-08-24 MED ORDER — METOPROLOL SUCCINATE ER 50 MG PO TB24
50.0000 mg | ORAL_TABLET | Freq: Two times a day (BID) | ORAL | Status: DC
  Administered 2016-08-24 – 2016-08-27 (×7): 50 mg via ORAL
  Filled 2016-08-24 (×8): qty 1

## 2016-08-24 MED ORDER — PANTOPRAZOLE SODIUM 40 MG PO TBEC
40.0000 mg | DELAYED_RELEASE_TABLET | Freq: Every day | ORAL | Status: DC
  Administered 2016-08-24 – 2016-08-27 (×4): 40 mg via ORAL
  Filled 2016-08-24 (×4): qty 1

## 2016-08-24 MED ORDER — SIMVASTATIN 20 MG PO TABS
20.0000 mg | ORAL_TABLET | Freq: Every day | ORAL | Status: DC
  Administered 2016-08-24 – 2016-08-26 (×4): 20 mg via ORAL
  Filled 2016-08-24 (×4): qty 1

## 2016-08-24 MED ORDER — DILTIAZEM HCL ER COATED BEADS 240 MG PO CP24
240.0000 mg | ORAL_CAPSULE | Freq: Every day | ORAL | Status: DC
  Administered 2016-08-24 – 2016-08-27 (×4): 240 mg via ORAL
  Filled 2016-08-24 (×4): qty 1

## 2016-08-24 MED ORDER — FUROSEMIDE 20 MG PO TABS
20.0000 mg | ORAL_TABLET | Freq: Every day | ORAL | Status: DC
  Administered 2016-08-24 – 2016-08-27 (×4): 20 mg via ORAL
  Filled 2016-08-24 (×4): qty 1

## 2016-08-24 MED ORDER — FOLIC ACID 1 MG PO TABS
1.0000 mg | ORAL_TABLET | Freq: Every day | ORAL | Status: DC
  Administered 2016-08-24 – 2016-08-27 (×4): 1 mg via ORAL
  Filled 2016-08-24 (×4): qty 1

## 2016-08-24 MED ORDER — FERROUS SULFATE 325 (65 FE) MG PO TABS
325.0000 mg | ORAL_TABLET | Freq: Two times a day (BID) | ORAL | Status: DC
  Administered 2016-08-24 – 2016-08-27 (×7): 325 mg via ORAL
  Filled 2016-08-24 (×6): qty 1

## 2016-08-24 NOTE — Care Management Obs Status (Signed)
[facility] NOTIFICATION   Patient Details  Name: ALEXAN MIGNOGNA MRN: [id number] Date of Birth: [birth year]   Medicare Observation Status Notification Given:  Yes    Lyndee Herbst, Chauncey Reading, RN 08/24/2016, 9:58 AM

## 2016-08-24 NOTE — H&P (Signed)
TRH H&P    Patient Demographics:    John Sampson, is a 80 y.o. male  MRN: [id number]  DOB - 11/19/1936  Admit Date - 08/23/2016  Referring MD/NP/PA: Dr. Bland Span  Outpatient Primary MD for the patient is Alonza Bogus, MD  Patient coming from: Home  Chief Complaint  Patient presents with  . Hematemesis      HPI:    John Sampson  is a 80 y.o. male, With history of COPD, atrial flutter, CAD status post CABG, gastric ulcer, peripheral arterial disease who came to the ED with complaints of hemoptysis and generalized weakness for 2 days. He denies fever, no chest pain. No nausea vomiting or diarrhea. No headache. He does have history of COPD and has not had was no shortness of breath. Patient is on anticoagulation with apixaban.  In the ED CT chest was done which was negative for PE, showed multiple bilateral pulmonary nodules largest measuring 1 cm. Also showed left lower lobe infiltrate, consistent with pneumonia. Patient was recently discharged from hospital on 07/25/2016 after he was treated for hand cellulitis   Review of systems:   .  A full 10 point Review of Systems was done, except as stated above, all other Review of Systems were negative.   With Past History of the following :    Past Medical History:  Diagnosis Date  . Atrial flutter ([place])   . COPD (chronic obstructive pulmonary disease) ([place]) 3.12.14   2D Echo, EF 50-55%  . Coronary atherosclerosis of native coronary artery    Multivessel status post CABG  . Gastric ulcer    Small - nonbleeding  . GERD (gastroesophageal reflux disease)   . Headache(784.0)   . Iron deficiency anemia    Negative Givens capsule study   . Myocardial infarction ([place])   . PAD (peripheral artery disease) ([facility])    Moderate bilateral SFA disease at angiography 01/2013  . Pituitary macroadenoma [facility])       Past Surgical History:  Procedure Laterality Date    . ABDOMINAL AORTAGRAM N/A 02/19/2013   Procedure: ABDOMINAL Maxcine Ham;  Surgeon: [name], MD;  Location: [facility] CATH LAB;  Service: Cardiovascular;  Laterality: N/A;  . CARDIOVERSION N/A 06/16/2015   Procedure: CARDIOVERSION;  Surgeon: Satira Sark, MD;  Location: [facility] ORS;  Service: Cardiovascular;  Laterality: N/A;  . COLONOSCOPY  08/18/2012   Procedure: COLONOSCOPY;  Surgeon: [name], MD;  Location: [facility] ENDO SUITE;  Service: Endoscopy;  Laterality: N/A;  1:25  . CORONARY ARTERY BYPASS GRAFT  2003   5 grafts - details not clear  . CRANIOTOMY  12/31/2011   Procedure: CRANIOTOMY HYPOPHYSECTOMY TRANSNASAL APPROACH;  Surgeon: [name], MD;  Location: [place] NEURO ORS;  Service: Neurosurgery;  Laterality: N/A;  Transphenoidal Hypophysectomy With Fat Graft Harvest from right abdomen   . ESOPHAGOGASTRODUODENOSCOPY  08/18/2012   Procedure: ESOPHAGOGASTRODUODENOSCOPY (EGD);  Surgeon: [name], MD;  Location: [facility] ENDO SU[facility];  Service: Endoscopy;  Laterality: N/A;  . EYE SURGERY  2012  . GIVENS CAPSULE STUDY N/A 01/25/2013  Procedure: GIVENS CAPSULE STUDY;  Surgeon: [name], MD;  Location: [facility] ENDO [facility];  Service: Endoscopy;  Laterality: N/A;  730  . LOWER EXTREMITY ANGIOGRAM N/A 02/19/2013   Procedure: LOWER EXTREMITY ANGIOGRAM;  Surgeon: [name], MD;  Location: [facility] CATH LAB;  Service: Cardiovascular;  Laterality: N/A;  . PERIPHERAL VASCULAR CATHETERIZATION N/A 01/19/2016   Procedure: Abdominal Aortogram;  Surgeon: Conrad Levelland, MD;  Location: [place] CV LAB;  Service: Cardiovascular;  Laterality: N/A;  . PERIPHERAL VASCULAR CATHETERIZATION Bilateral 01/19/2016   Procedure: Lower Extremity Angiography;  Surgeon: Conrad Rib Lake, MD;  Location: [place] CV LAB;  Service: Cardiovascular;  Laterality: Bilateral;  . PV Angiogram  02/19/13   Indications: slow healing left calf ulcer  . Stress Myocardial Perfusion  12/08/2011   Indications: Abnormal EKG, Eval of prior GABG   . TEE WITHOUT CARDIOVERSION N/A 06/16/2015   Procedure: TRANSESOPHAGEAL ECHOCARDIOGRAM (TEE);  Surgeon: Satira Sark, MD;  Location: [facility] ORS;  Service: Cardiovascular;  Laterality: N/A;  . TONSILLECTOMY  Age 15      Social History:      Social History  Substance Use Topics  . Smoking status: Former Smoker    Packs/day: 3.00    Years: 45.00    Types: Cigarettes    Start date: 01/24/1955    Quit date: 01/23/2002  . Smokeless tobacco: Never Used     Comment: Quit 11 yrs ago  . Alcohol use No       Family History :     Family History  Problem Relation Age of Onset  . CAD Mother   . Heart disease Father     before age 89      Home Medications:   Prior to Admission medications   Medication Sig Start Date End Date Taking? Authorizing Provider  acetaminophen (TYLENOL) 325 MG tablet Take 650 mg by mouth daily as needed for headache.   Yes Historical Provider, MD  albuterol (PROVENTIL HFA) 108 (90 BASE) MCG/ACT inhaler Inhale 2 puffs into the lungs every 6 (six) hours as needed for wheezing or shortness of breath. 11/15/14  Yes Satira Sark, MD  apixaban (ELIQUIS) 5 MG TABS tablet Take 1 tablet (5 mg total) by mouth 2 (two) times daily. 12/24/15  Yes Luke K Kilroy, PA-C  diltiazem (CARDIZEM CD) 240 MG 24 hr capsule Take 1 capsule (240 mg total) by mouth daily. 05/30/15  Yes Lendon Colonel, NP  docusate sodium (COLACE) 100 MG capsule Take 200 mg by mouth at bedtime.   Yes Historical Provider, MD  ferrous sulfate 325 (65 FE) MG tablet Take 1 tablet (325 mg total) by mouth 2 (two) times daily with a meal. 01/23/13  Yes [name], MD  fish oil-omega-3 fatty acids 1000 MG capsule Take 1 g by mouth 2 (two) times daily.    Yes Historical Provider, MD  folic acid (FOLVITE) 1 MG tablet Take 1 mg by mouth daily.    Yes Historical Provider, MD  furosemide (LASIX) 20 MG tablet Take 1 tablet (20 mg total) by mouth daily. 03/01/16  Yes Satira Sark, MD  lisinopril  (PRINIVIL,ZESTRIL) 10 MG tablet Take 1 tablet (10 mg total) by mouth daily. 05/30/15  Yes Lendon Colonel, NP  metoprolol succinate (TOPROL-XL) 50 MG 24 hr tablet Take 50 mg by mouth 2 (two) times daily. Take with or immediately following a meal.   Yes Historical Provider, MD  omeprazole (PRILOSEC) 20 MG capsule Take 20 mg by  mouth 2 (two) times daily before a meal.   Yes Historical Provider, MD  potassium chloride SA (K-DUR,KLOR-CON) 20 MEQ tablet Take 20 mEq by mouth daily.   Yes Historical Provider, MD  simvastatin (ZOCOR) 40 MG tablet Take 0.5 tablets (20 mg total) by mouth at bedtime. 07/25/16  Yes Sinda Du, MD  doxycycline (VIBRA-TABS) 100 MG tablet Take 1 tablet (100 mg total) by mouth every 12 (twelve) hours. Patient not taking: Reported on 08/23/2016 07/25/16   Sinda Du, MD  nitroGLYCERIN (NITROSTAT) 0.4 MG SL tablet Place 1 tablet (0.4 mg total) under the tongue every 5 (five) minutes as needed for chest pain. 06/07/14   Satira Sark, MD     Allergies:    No Known Allergies   Physical Exam:   Vitals  Blood pressure (!) 159/48, pulse 75, temperature 98.1 F (36.7 C), temperature source Oral, resp. rate 15, height 5\' 9"  (1.753 m), weight 96.4 kg (212 lb 8 oz), SpO2 98 %.  1.  General: Elderly gentleman in mild distress  2. Psychiatric:  Intact judgement and  insight, awake alert, oriented x 3.  3. Neurologic: No focal neurological deficits, all cranial nerves intact.Strength 5/5 all 4 extremities, sensation intact all 4 extremities, plantars down going.  4. Eyes :  anicteric sclerae, moist conjunctivae with no lid lag. PERRLA.  5. ENMT:  Oropharynx clear with moist mucous membranes and good dentition  6. Neck:  supple, no cervical lymphadenopathy appriciated, No thyromegaly  7. Respiratory : Normal respiratory effort, good air movement bilaterally,clear to  auscultation bilaterally  8. Cardiovascular : RRR, no gallops, rubs or murmurs, no leg  edema  9. Gastrointestinal:  Positive bowel sounds, abdomen soft, non-tender to palpation,no hepatosplenomegaly, no rigidity or guarding       10. Skin:  No cyanosis, normal texture and turgor, no rash, lesions or ulcers  11.Musculoskeletal:  Good muscle tone,  joints appear normal , no effusions,  normal range of motion    Data Review:    CBC  Recent Labs Lab 08/23/16 1750  WBC 11.3*  HGB 11.5*  HCT 36.9*  PLT 269  MCV 93.9  MCH 29.3  MCHC 31.2  RDW 16.0*   ------------------------------------------------------------------------------------------------------------------  Chemistries   Recent Labs Lab 08/23/16 1750  NA 136  K 3.6  CL 106  CO2 23  GLUCOSE 91  BUN 15  CREATININE 0.89  CALCIUM 9.3   ------------------------------------------------------------------------------------------------------------------  ------------------------------------------------------------------------------------------------------------------   --------------------------------------------------------------------------------------------------------------- Urine analysis: No results found for: COLORURINE, APPEARANCEUR, LABSPEC, PHURINE, GLUCOSEU, HGBUR, BILIRUBINUR, KETONESUR, PROTEINUR, UROBILINOGEN, NITRITE, LEUKOCYTESUR    Imaging Results:    Dg Chest 2 View  Result Date: 08/23/2016 CLINICAL DATA:  Coughing up bloody sputum for 4 days. Generalized weakness. EXAM: CHEST  2 VIEW COMPARISON:  02/14/2013 FINDINGS: Mildly elevated right hemidiaphragm. Prior CABG. Mild enlargement of the cardiopericardial silhouette. Atherosclerotic calcification of the aortic arch. Mild upper zone vascular prominence without overt edema. No pleural effusion. Bony demineralization. IMPRESSION: 1. Mild enlargement of the cardiopericardial silhouette with suspected pulmonary venous hypertension but no overt edema. 2. Elevated right hemidiaphragm. 3. Prior CABG. 4. Atherosclerotic calcification of the  aortic arch. 5. If the patient is experiencing true hemoptysis, chest CT may be warranted. Electronically Signed   By: Van Clines M.D.   On: 08/23/2016 20:14   Ct Angio Chest Pe W And/or Wo Contrast  Result Date: 08/23/2016 CLINICAL DATA:  80 year old male with hemoptysis. EXAM: CT ANGIOGRAPHY CHEST WITH CONTRAST TECHNIQUE: Multidetector CT imaging of the chest was  performed using the standard protocol during bolus administration of intravenous contrast. Multiplanar CT image reconstructions and MIPs were obtained to evaluate the vascular anatomy. CONTRAST:  100 cc Isovue 370 COMPARISON:  Chest radiograph dated 08/23/2016 FINDINGS: Evaluation of this exam is limited due to respiratory motion artifact. There is emphysematous changes of the lungs. There is a patchy area of ground-glass and nodular density at the left lung base concerning for pneumonia. Right infrahilar and right leg base consolidative changes may represent atelectasis although infiltrate is not excluded. Stop there are multiple bilateral pulmonary nodules scattered throughout the lungs. The largest nodule measures approximately 10 mm in the left upper lobe along the pleural surface (series 4, image 41). A 10 mm subpleural nodular density is noted at the right lung base (series 4, image 74). There is no pleural effusion or pneumothorax. The central airways are patent. There is advanced atherosclerotic calcification of the thoracic aorta. There is no aneurysmal dilatation or evidence of dissection. The origins of the great vessels of the aortic arch appear patent. Evaluation for pulmonary embolism is limited due to respiratory motion artifact and suboptimal visualization of the peripheral branches of the pulmonary artery. No definite central pulmonary artery embolus identified. V/Q scan may provide better evaluation if there is high clinical concern for PE. Top-normal cardiac size. There is advanced coronary vascular calcification and CABG  surgery. No pericardial effusion. Mildly enlarged right hilar lymph nodes measuring approximately 12 mm in short axis. There is no mediastinal adenopathy. The esophagus is grossly unremarkable. An 11 mm calcified nodule noted in the left thyroid gland. There is no axillary adenopathy. The chest wall soft tissues appear unremarkable. There is osteopenia with degenerative changes of the spine. No acute fracture. Median sternotomy wires noted. The visualized upper abdomen appears unremarkable. IMPRESSION: No definite CT evidence of central pulmonary artery embolus. Multiple bilateral pulmonary nodules measuring up to 10 mm. Non-contrast chest CT at 3-6 months is recommended. If the nodules are stable at time of repeat CT, then future CT at 18-24 months (from today's scan) is considered optional for low-risk patients, but is recommended for high-risk patients. This recommendation follows the consensus statement: Guidelines for Management of Incidental Pulmonary Nodules Detected on CT Images: From the Fleischner Society 2017; Radiology 2017; 284:228-243. Patchy left lung base density concerning for pneumonia/infiltrate. Correlation with clinical exam and follow-up to resolution recommended. Mildly enlarged right hilar lymph nodes. Electronically Signed   By: Anner Crete M.D.   On: 08/23/2016 22:20    My personal review of EKG: Rhythm NSR   Assessment & Plan:    Active Problems:   Pneumonia   HCAP (healthcare-associated pneumonia)    1. Healthcare associated pneumonia- started vancomycin and cefepime for pharmacy consultation. Follow blood cultures, Gram stain and sputum culture, strep pneumo urinary antigen. 2. Hemoptysis- likely due to above, will hold anticoagulation with apixaban 3. Paroxysmal atrial fibrillation- heart rate currently in normal sinus rhythm, continue Cardizem and metoprolol. Anticoagulation on hold as above. 4. COPD- not in exacerbation, stable, continue duol nebs every 6 hours  when necessary 5. Hypertension- blood pressure controlled, continue Cardizem, metoprolol 6. Multiple bilateral pulmonary nodules- CT chest shows multiple bilateral pulmonary nodules, largest measuring 1 cm, repeat CT chest recommended in 3-6 months   DVT Prophylaxis-   SCDs   AM Labs Ordered, also please review Full Orders  Family Communication: No family at bedside  Code Status:   DO NOT RESUSCITATE  Admission status:  observation    Time spent in  minutes : 60 minutes   Dianne Whelchel S M.D on 08/24/2016 at 12:39 AM  Between 7am to 7pm - Pager - [phone]. After 7pm go to [email or web address] - password [facility]  [facility] - Office  [phone]

## 2016-08-24 NOTE — Progress Notes (Signed)
Subjective: Patient was was admitted due to healthcare associated pneumonia. He was started on combinations antibiotics. Patient is complain of shortness of breath, He has history of COPD.  Objective: Vital signs in last 24 hours: Temp:  [98.1 F (36.7 C)-98.4 F (36.9 C)] 98.4 F (36.9 C) (09/26 0508) Pulse Rate:  [52-77] 52 (09/26 0508) Resp:  [15-26] 18 (09/26 0508) BP: (147-175)/(48-67) 147/64 (09/26 0508) SpO2:  [95 %-100 %] 98 % (09/26 0508) Weight:  [96.4 kg (212 lb 8 oz)-97.5 kg (215 lb)] 96.4 kg (212 lb 8 oz) (09/25 2300) Weight change:  Last BM Date: 08/23/16  Intake/Output from previous day: 09/25 0701 - 09/26 0700 In: 50 [IV Piggyback:50] Out: 200 [Urine:200]  PHYSICAL EXAM General appearance: alert, fatigued and no distress Resp: diminished breath sounds bilaterally and rhonchi bilaterally Cardio: irregularly irregular rhythm GI: soft, non-tender; bowel sounds normal; no masses,  no organomegaly Extremities: extremities normal, atraumatic, no cyanosis or edema  Lab Results:  Results for orders placed or performed during the hospital encounter of 08/23/16 (from the past 48 hour(s))  Basic metabolic panel     Status: None   Collection Time: 08/23/16  5:50 PM  Result Value Ref Range   Sodium 136 135 - 145 mmol/L   Potassium 3.6 3.5 - 5.1 mmol/L   Chloride 106 101 - 111 mmol/L   CO2 23 22 - 32 mmol/L   Glucose, Bld 91 65 - 99 mg/dL   BUN 15 6 - 20 mg/dL   Creatinine, Ser 0.89 0.61 - 1.24 mg/dL   Calcium 9.3 8.9 - 10.3 mg/dL   GFR calc non Af Amer >60 >60 mL/min   GFR calc Af Amer >60 >60 mL/min    Comment: (NOTE) The eGFR has been calculated using the CKD EPI equation. This calculation has not been validated in all clinical situations. eGFR's persistently <60 mL/min signify possible Chronic Kidney Disease.    Anion gap 7 5 - 15  CBC     Status: Abnormal   Collection Time: 08/23/16  5:50 PM  Result Value Ref Range   WBC 11.3 (H) 4.0 - 10.5 K/uL   RBC  3.93 (L) 4.22 - 5.81 MIL/uL   Hemoglobin 11.5 (L) 13.0 - 17.0 g/dL   HCT 36.9 (L) 39.0 - 52.0 %   MCV 93.9 78.0 - 100.0 fL   MCH 29.3 26.0 - 34.0 pg   MCHC 31.2 30.0 - 36.0 g/dL   RDW 16.0 (H) 11.5 - 15.5 %   Platelets 269 150 - 400 K/uL  I-stat troponin, ED     Status: None   Collection Time: 08/23/16  9:05 PM  Result Value Ref Range   Troponin i, poc 0.00 0.00 - 0.08 ng/mL   Comment 3            Comment: Due to the release kinetics of cTnI, a negative result within the first hours of the onset of symptoms does not rule out myocardial infarction with certainty. If myocardial infarction is still suspected, repeat the test at appropriate intervals.   Culture, blood (routine x 2) Call MD if unable to obtain prior to antibiotics being given     Status: None (Preliminary result)   Collection Time: 08/24/16 12:57 AM  Result Value Ref Range   Specimen Description BLOOD RIGHT HAND    Special Requests      BOTTLES DRAWN AEROBIC AND ANAEROBIC AEB 8CC ANA 4CC   Culture PENDING    Report Status PENDING   Culture, blood (routine  x 2) Call MD if unable to obtain prior to antibiotics being given     Status: None (Preliminary result)   Collection Time: 08/24/16  1:11 AM  Result Value Ref Range   Specimen Description BLOOD LEFT HAND    Special Requests BOTTLES DRAWN AEROBIC AND ANAEROBIC 4CC EACH    Culture PENDING    Report Status PENDING   Culture, sputum-assessment     Status: None   Collection Time: 08/24/16  2:45 AM  Result Value Ref Range   Specimen Description SPUTUM EXPECTORATED    Special Requests NONE    Sputum evaluation THIS SPECIMEN IS ACCEPTABLE FOR SPUTUM CULTURE    Report Status 08/24/2016 FINAL   CBC     Status: Abnormal   Collection Time: 08/24/16  6:06 AM  Result Value Ref Range   WBC 11.8 (H) 4.0 - 10.5 K/uL   RBC 3.81 (L) 4.22 - 5.81 MIL/uL   Hemoglobin 11.1 (L) 13.0 - 17.0 g/dL   HCT 35.7 (L) 39.0 - 52.0 %   MCV 93.7 78.0 - 100.0 fL   MCH 29.1 26.0 - 34.0 pg    MCHC 31.1 30.0 - 36.0 g/dL   RDW 16.0 (H) 11.5 - 15.5 %   Platelets 284 150 - 400 K/uL  Comprehensive metabolic panel     Status: Abnormal   Collection Time: 08/24/16  6:06 AM  Result Value Ref Range   Sodium 136 135 - 145 mmol/L   Potassium 3.6 3.5 - 5.1 mmol/L   Chloride 106 101 - 111 mmol/L   CO2 23 22 - 32 mmol/L   Glucose, Bld 105 (H) 65 - 99 mg/dL   BUN 15 6 - 20 mg/dL   Creatinine, Ser 0.85 0.61 - 1.24 mg/dL   Calcium 9.1 8.9 - 10.3 mg/dL   Total Protein 6.7 6.5 - 8.1 g/dL   Albumin 3.7 3.5 - 5.0 g/dL   AST 13 (L) 15 - 41 U/L   ALT 16 (L) 17 - 63 U/L   Alkaline Phosphatase 53 38 - 126 U/L   Total Bilirubin 0.6 0.3 - 1.2 mg/dL   GFR calc non Af Amer >60 >60 mL/min   GFR calc Af Amer >60 >60 mL/min    Comment: (NOTE) The eGFR has been calculated using the CKD EPI equation. This calculation has not been validated in all clinical situations. eGFR's persistently <60 mL/min signify possible Chronic Kidney Disease.    Anion gap 7 5 - 15    ABGS No results for input(s): PHART, PO2ART, TCO2, HCO3 in the last 72 hours.  Invalid input(s): PCO2 CULTURES Recent Results (from the past 240 hour(s))  Culture, blood (routine x 2) Call MD if unable to obtain prior to antibiotics being given     Status: None (Preliminary result)   Collection Time: 08/24/16 12:57 AM  Result Value Ref Range Status   Specimen Description BLOOD RIGHT HAND  Final   Special Requests   Final    BOTTLES DRAWN AEROBIC AND ANAEROBIC AEB 8CC ANA 4CC   Culture PENDING  Incomplete   Report Status PENDING  Incomplete  Culture, blood (routine x 2) Call MD if unable to obtain prior to antibiotics being given     Status: None (Preliminary result)   Collection Time: 08/24/16  1:11 AM  Result Value Ref Range Status   Specimen Description BLOOD LEFT HAND  Final   Special Requests BOTTLES DRAWN AEROBIC AND ANAEROBIC 4CC EACH  Final   Culture PENDING  Incomplete  Report Status PENDING  Incomplete  Culture,  sputum-assessment     Status: None   Collection Time: 08/24/16  2:45 AM  Result Value Ref Range Status   Specimen Description SPUTUM EXPECTORATED  Final   Special Requests NONE  Final   Sputum evaluation THIS SPECIMEN IS ACCEPTABLE FOR SPUTUM CULTURE  Final   Report Status 08/24/2016 FINAL  Final   Studies/Results: Dg Chest 2 View  Result Date: 08/23/2016 CLINICAL DATA:  Coughing up bloody sputum for 4 days. Generalized weakness. EXAM: CHEST  2 VIEW COMPARISON:  02/14/2013 FINDINGS: Mildly elevated right hemidiaphragm. Prior CABG. Mild enlargement of the cardiopericardial silhouette. Atherosclerotic calcification of the aortic arch. Mild upper zone vascular prominence without overt edema. No pleural effusion. Bony demineralization. IMPRESSION: 1. Mild enlargement of the cardiopericardial silhouette with suspected pulmonary venous hypertension but no overt edema. 2. Elevated right hemidiaphragm. 3. Prior CABG. 4. Atherosclerotic calcification of the aortic arch. 5. If the patient is experiencing true hemoptysis, chest CT may be warranted. Electronically Signed   By: Van Clines M.D.   On: 08/23/2016 20:14   Ct Angio Chest Pe W And/or Wo Contrast  Result Date: 08/23/2016 CLINICAL DATA:  80 year old male with hemoptysis. EXAM: CT ANGIOGRAPHY CHEST WITH CONTRAST TECHNIQUE: Multidetector CT imaging of the chest was performed using the standard protocol during bolus administration of intravenous contrast. Multiplanar CT image reconstructions and MIPs were obtained to evaluate the vascular anatomy. CONTRAST:  100 cc Isovue 370 COMPARISON:  Chest radiograph dated 08/23/2016 FINDINGS: Evaluation of this exam is limited due to respiratory motion artifact. There is emphysematous changes of the lungs. There is a patchy area of ground-glass and nodular density at the left lung base concerning for pneumonia. Right infrahilar and right leg base consolidative changes may represent atelectasis although  infiltrate is not excluded. Stop there are multiple bilateral pulmonary nodules scattered throughout the lungs. The largest nodule measures approximately 10 mm in the left upper lobe along the pleural surface (series 4, image 41). A 10 mm subpleural nodular density is noted at the right lung base (series 4, image 74). There is no pleural effusion or pneumothorax. The central airways are patent. There is advanced atherosclerotic calcification of the thoracic aorta. There is no aneurysmal dilatation or evidence of dissection. The origins of the great vessels of the aortic arch appear patent. Evaluation for pulmonary embolism is limited due to respiratory motion artifact and suboptimal visualization of the peripheral branches of the pulmonary artery. No definite central pulmonary artery embolus identified. V/Q scan may provide better evaluation if there is high clinical concern for PE. Top-normal cardiac size. There is advanced coronary vascular calcification and CABG surgery. No pericardial effusion. Mildly enlarged right hilar lymph nodes measuring approximately 12 mm in short axis. There is no mediastinal adenopathy. The esophagus is grossly unremarkable. An 11 mm calcified nodule noted in the left thyroid gland. There is no axillary adenopathy. The chest wall soft tissues appear unremarkable. There is osteopenia with degenerative changes of the spine. No acute fracture. Median sternotomy wires noted. The visualized upper abdomen appears unremarkable. IMPRESSION: No definite CT evidence of central pulmonary artery embolus. Multiple bilateral pulmonary nodules measuring up to 10 mm. Non-contrast chest CT at 3-6 months is recommended. If the nodules are stable at time of repeat CT, then future CT at 18-24 months (from today's scan) is considered optional for low-risk patients, but is recommended for high-risk patients. This recommendation follows the consensus statement: Guidelines for Management of Incidental  Pulmonary Nodules Detected  on CT Images: From the Fleischner Society 2017; Radiology 2017; [phone]. Patchy left lung base density concerning for pneumonia/infiltrate. Correlation with clinical exam and follow-up to resolution recommended. Mildly enlarged right hilar lymph nodes. Electronically Signed   By: Anner Crete M.D.   On: 08/23/2016 22:20    Medications: I have reviewed the patient's current medications.  Assesment:   Active Problems:   Essential hypertension, benign   Hx of CABG   COPD (chronic obstructive pulmonary disease) (HCC)   PAF (paroxysmal atrial fibrillation) (HCC)   Pneumonia   HCAP (healthcare-associated pneumonia)    Plan:  Medications reviewed Continue combinations iv antibiotics Continue neb treatment Continue regular treatment.    LOS: 1 day   Seif Teichert 08/24/2016, 8:12 AM

## 2016-08-24 NOTE — Progress Notes (Addendum)
Pharmacy Antibiotic Note  John Sampson is a 80 y.o. male admitted on 08/23/2016 with health-care associated pneumonia.  Pharmacy has been consulted for vancomycin dosing and antibiotic renal dose adjustment . Patient was recently discharged from hospital on 07/25/2016 after he was treated for hand cellulitis with doxycycline 100 mg po bid.  Plan: Vancomycin 1250 mg IV every 12 hours.  Goal trough 15-20 mcg/mL.  Continue Cefepime 1 gram IV Q8hr.  Height: 5\' 9"  (175.3 cm) Weight: 212 lb 8 oz (96.4 kg) IBW/kg (Calculated) : 70.7 kg  Temp (24hrs), Avg:98.1 F (36.7 C), Min:98.1 F (36.7 C), Max:98.1 F (36.7 C)   Recent Labs Lab 08/23/16 1750  WBC 11.3*  CREATININE 0.89    Estimated Creatinine Clearance: 78.4 mL/min (by C-G formula based on SCr of 0.89 mg/dL).    CT scan showed left lower lobe infiltrate, consistent with pneumonia and multiple bilateral pulmonary nodules.  No Known Allergies  Antimicrobials this admission: Zosyn 3.375 grams IV x 1  08/23/16 Vancomycin 1 gram given in ED then 1250 mg IV Q12hr 08/23/16 >>  Cefepime 1 gram IV Q8hr 08/24/16 >>  Dose adjustments this admission: None  Microbiology results: 08/24/16 BCx: pending 08/24/16 Sputum: pending  08/24/16 Strep Pneumoniae urinary antigen: pending  Thank you for allowing pharmacy to be a part of this patient's care.  Beryle Lathe 08/24/2016 12:55 AM

## 2016-08-24 NOTE — Care Management Note (Signed)
Case Management Note  Patient Details  Name: EPIC NOLAND MRN: [id number] Date of Birth: 02/23/1936  Subjective/Objective:  Patient adm from home alone with pneumonia.  He has a PCP and insurance, reports no issues. He is completley independent.                Action/Plan: Anticipate DC home with self care .   Expected Discharge Date:  08/25/16               Expected Discharge Plan:  Home/Self Care  In-House Referral:  NA  Discharge planning Services  CM Consult  Post Acute Care Choice:  NA Choice offered to:  NA  DME Arranged:    DME Agency:     HH Arranged:    HH Agency:     Status of Service:  Completed, signed off  If discussed at [employer] of Stay Meetings, dates discussed:    Additional Comments:  Lucette Kratz, Chauncey Reading, RN 08/24/2016, 10:33 AM

## 2016-08-25 ENCOUNTER — Inpatient Hospital Stay (HOSPITAL_COMMUNITY): Payer: Medicare Other

## 2016-08-25 LAB — HIV ANTIBODY (ROUTINE TESTING W REFLEX): HIV Screen 4th Generation wRfx: NONREACTIVE

## 2016-08-25 MED ORDER — IPRATROPIUM-ALBUTEROL 0.5-2.5 (3) MG/3ML IN SOLN
3.0000 mL | Freq: Three times a day (TID) | RESPIRATORY_TRACT | Status: DC
  Administered 2016-08-25 – 2016-08-27 (×7): 3 mL via RESPIRATORY_TRACT
  Filled 2016-08-25 (×7): qty 3

## 2016-08-25 MED ORDER — IOPAMIDOL (ISOVUE-300) INJECTION 61%
75.0000 mL | Freq: Once | INTRAVENOUS | Status: AC | PRN
  Administered 2016-08-25: 75 mL via INTRAVENOUS

## 2016-08-25 MED ORDER — IOPAMIDOL (ISOVUE-370) INJECTION 76%: 75.0000 mL | Freq: Once | INTRAVENOUS | Status: DC | PRN

## 2016-08-25 NOTE — Progress Notes (Signed)
Subjective: Patient continue to have cough and congestion. He feels slightly better. He is receiving combinations of IV antibiotics and nebulizer treatment..  Objective: Vital signs in last 24 hours: Temp:  [97.6 F (36.4 C)-98.1 F (36.7 C)] 97.6 F (36.4 C) (09/27 0529) Pulse Rate:  [50-64] 61 (09/27 0529) Resp:  [15-20] 15 (09/27 0529) BP: (138-147)/(41-57) 138/41 (09/27 0529) SpO2:  [94 %-98 %] 94 % (09/27 0748) Weight change:  Last BM Date: 08/24/16  Intake/Output from previous day: 09/26 0701 - 09/27 0700 In: 1370 [P.O.:720; IV Piggyback:650] Out: 1000 [Urine:1000]  PHYSICAL EXAM General appearance: alert, fatigued and no distress Resp: diminished breath sounds bilaterally and rhonchi bilaterally Cardio: irregularly irregular rhythm GI: soft, non-tender; bowel sounds normal; no masses,  no organomegaly Extremities: extremities normal, atraumatic, no cyanosis or edema  Lab Results:  Results for orders placed or performed during the hospital encounter of 08/23/16 (from the past 48 hour(s))  Basic metabolic panel     Status: None   Collection Time: 08/23/16  5:50 PM  Result Value Ref Range   Sodium 136 135 - 145 mmol/L   Potassium 3.6 3.5 - 5.1 mmol/L   Chloride 106 101 - 111 mmol/L   CO2 23 22 - 32 mmol/L   Glucose, Bld 91 65 - 99 mg/dL   BUN 15 6 - 20 mg/dL   Creatinine, Ser 0.89 0.61 - 1.24 mg/dL   Calcium 9.3 8.9 - 10.3 mg/dL   GFR calc non Af Amer >60 >60 mL/min   GFR calc Af Amer >60 >60 mL/min    Comment: (NOTE) The eGFR has been calculated using the CKD EPI equation. This calculation has not been validated in all clinical situations. eGFR's persistently <60 mL/min signify possible Chronic Kidney Disease.    Anion gap 7 5 - 15  CBC     Status: Abnormal   Collection Time: 08/23/16  5:50 PM  Result Value Ref Range   WBC 11.3 (H) 4.0 - 10.5 K/uL   RBC 3.93 (L) 4.22 - 5.81 MIL/uL   Hemoglobin 11.5 (L) 13.0 - 17.0 g/dL   HCT 36.9 (L) 39.0 - 52.0 %   MCV  93.9 78.0 - 100.0 fL   MCH 29.3 26.0 - 34.0 pg   MCHC 31.2 30.0 - 36.0 g/dL   RDW 16.0 (H) 11.5 - 15.5 %   Platelets 269 150 - 400 K/uL  I-stat troponin, ED     Status: None   Collection Time: 08/23/16  9:05 PM  Result Value Ref Range   Troponin i, poc 0.00 0.00 - 0.08 ng/mL   Comment 3            Comment: Due to the release kinetics of cTnI, a negative result within the first hours of the onset of symptoms does not rule out myocardial infarction with certainty. If myocardial infarction is still suspected, repeat the test at appropriate intervals.   HIV antibody     Status: None   Collection Time: 08/24/16 12:57 AM  Result Value Ref Range   HIV Screen 4th Generation wRfx Non Reactive Non Reactive    Comment: (NOTE) Performed At: [facility] [address] [place], [place] [id number] Lindon Romp MD [id number]   Culture, blood (routine x 2) Call MD if unable to obtain prior to antibiotics being given     Status: None (Preliminary result)   Collection Time: 08/24/16 12:57 AM  Result Value Ref Range   Specimen Description BLOOD RIGHT HAND    Special  Requests      BOTTLES DRAWN AEROBIC AND ANAEROBIC AEB 8CC ANA 4CC   Culture NO GROWTH < 12 HOURS    Report Status PENDING   Strep pneumoniae urinary antigen     Status: None   Collection Time: 08/24/16  1:08 AM  Result Value Ref Range   Strep Pneumo Urinary Antigen NEGATIVE NEGATIVE    Comment:        Infection due to S. pneumoniae cannot be absolutely ruled out since the antigen present may be below the detection limit of the test. Performed at [facility]   Culture, blood (routine x 2) Call MD if unable to obtain prior to antibiotics being given     Status: None (Preliminary result)   Collection Time: 08/24/16  1:11 AM  Result Value Ref Range   Specimen Description BLOOD LEFT HAND    Special Requests BOTTLES DRAWN AEROBIC AND ANAEROBIC 4CC EACH    Culture NO GROWTH < 12 HOURS    Report Status  PENDING   Culture, sputum-assessment     Status: None   Collection Time: 08/24/16  2:45 AM  Result Value Ref Range   Specimen Description SPUTUM EXPECTORATED    Special Requests NONE    Sputum evaluation THIS SPECIMEN IS ACCEPTABLE FOR SPUTUM CULTURE    Report Status 08/24/2016 FINAL   Culture, respiratory (NON-Expectorated)     Status: None (Preliminary result)   Collection Time: 08/24/16  2:45 AM  Result Value Ref Range   Specimen Description EXPECTORATED SPUTUM    Special Requests NONE    Gram Stain      FEW WBC PRESENT, PREDOMINANTLY PMN FEW SQUAMOUS EPITHELIAL CELLS PRESENT FEW GRAM NEGATIVE RODS FEW GRAM NEGATIVE COCCOBACILLI RARE GRAM NEGATIVE DIPLOCOCCI RARE GRAM POSITIVE COCCI IN PAIRS Performed at [facility]    Culture PENDING    Report Status PENDING   CBC     Status: Abnormal   Collection Time: 08/24/16  6:06 AM  Result Value Ref Range   WBC 11.8 (H) 4.0 - 10.5 K/uL   RBC 3.81 (L) 4.22 - 5.81 MIL/uL   Hemoglobin 11.1 (L) 13.0 - 17.0 g/dL   HCT 35.7 (L) 39.0 - 52.0 %   MCV 93.7 78.0 - 100.0 fL   MCH 29.1 26.0 - 34.0 pg   MCHC 31.1 30.0 - 36.0 g/dL   RDW 16.0 (H) 11.5 - 15.5 %   Platelets 284 150 - 400 K/uL  Comprehensive metabolic panel     Status: Abnormal   Collection Time: 08/24/16  6:06 AM  Result Value Ref Range   Sodium 136 135 - 145 mmol/L   Potassium 3.6 3.5 - 5.1 mmol/L   Chloride 106 101 - 111 mmol/L   CO2 23 22 - 32 mmol/L   Glucose, Bld 105 (H) 65 - 99 mg/dL   BUN 15 6 - 20 mg/dL   Creatinine, Ser 0.85 0.61 - 1.24 mg/dL   Calcium 9.1 8.9 - 10.3 mg/dL   Total Protein 6.7 6.5 - 8.1 g/dL   Albumin 3.7 3.5 - 5.0 g/dL   AST 13 (L) 15 - 41 U/L   ALT 16 (L) 17 - 63 U/L   Alkaline Phosphatase 53 38 - 126 U/L   Total Bilirubin 0.6 0.3 - 1.2 mg/dL   GFR calc non Af Amer >60 >60 mL/min   GFR calc Af Amer >60 >60 mL/min    Comment: (NOTE) The eGFR has been calculated using the CKD EPI equation. This calculation has  not been validated in all  clinical situations. eGFR's persistently <60 mL/min signify possible Chronic Kidney Disease.    Anion gap 7 5 - 15    ABGS No results for input(s): PHART, PO2ART, TCO2, HCO3 in the last 72 hours.  Invalid input(s): PCO2 CULTURES Recent Results (from the past 240 hour(s))  Culture, blood (routine x 2) Call MD if unable to obtain prior to antibiotics being given     Status: None (Preliminary result)   Collection Time: 08/24/16 12:57 AM  Result Value Ref Range Status   Specimen Description BLOOD RIGHT HAND  Final   Special Requests   Final    BOTTLES DRAWN AEROBIC AND ANAEROBIC AEB 8CC ANA 4CC   Culture NO GROWTH < 12 HOURS  Final   Report Status PENDING  Incomplete  Culture, blood (routine x 2) Call MD if unable to obtain prior to antibiotics being given     Status: None (Preliminary result)   Collection Time: 08/24/16  1:11 AM  Result Value Ref Range Status   Specimen Description BLOOD LEFT HAND  Final   Special Requests BOTTLES DRAWN AEROBIC AND ANAEROBIC 4CC EACH  Final   Culture NO GROWTH < 12 HOURS  Final   Report Status PENDING  Incomplete  Culture, sputum-assessment     Status: None   Collection Time: 08/24/16  2:45 AM  Result Value Ref Range Status   Specimen Description SPUTUM EXPECTORATED  Final   Special Requests NONE  Final   Sputum evaluation THIS SPECIMEN IS ACCEPTABLE FOR SPUTUM CULTURE  Final   Report Status 08/24/2016 FINAL  Final  Culture, respiratory (NON-Expectorated)     Status: None (Preliminary result)   Collection Time: 08/24/16  2:45 AM  Result Value Ref Range Status   Specimen Description EXPECTORATED SPUTUM  Final   Special Requests NONE  Final   Gram Stain   Final    FEW WBC PRESENT, PREDOMINANTLY PMN FEW SQUAMOUS EPITHELIAL CELLS PRESENT FEW GRAM NEGATIVE RODS FEW GRAM NEGATIVE COCCOBACILLI RARE GRAM NEGATIVE DIPLOCOCCI RARE GRAM POSITIVE COCCI IN PAIRS Performed at [facility]    Culture PENDING  Incomplete   Report Status PENDING   Incomplete   Studies/Results: Dg Chest 2 View  Result Date: 08/23/2016 CLINICAL DATA:  Coughing up bloody sputum for 4 days. Generalized weakness. EXAM: CHEST  2 VIEW COMPARISON:  02/14/2013 FINDINGS: Mildly elevated right hemidiaphragm. Prior CABG. Mild enlargement of the cardiopericardial silhouette. Atherosclerotic calcification of the aortic arch. Mild upper zone vascular prominence without overt edema. No pleural effusion. Bony demineralization. IMPRESSION: 1. Mild enlargement of the cardiopericardial silhouette with suspected pulmonary venous hypertension but no overt edema. 2. Elevated right hemidiaphragm. 3. Prior CABG. 4. Atherosclerotic calcification of the aortic arch. 5. If the patient is experiencing true hemoptysis, chest CT may be warranted. Electronically Signed   By: Van Clines M.D.   On: 08/23/2016 20:14   Ct Angio Chest Pe W And/or Wo Contrast  Result Date: 08/23/2016 CLINICAL DATA:  80 year old male with hemoptysis. EXAM: CT ANGIOGRAPHY CHEST WITH CONTRAST TECHNIQUE: Multidetector CT imaging of the chest was performed using the standard protocol during bolus administration of intravenous contrast. Multiplanar CT image reconstructions and MIPs were obtained to evaluate the vascular anatomy. CONTRAST:  100 cc Isovue 370 COMPARISON:  Chest radiograph dated 08/23/2016 FINDINGS: Evaluation of this exam is limited due to respiratory motion artifact. There is emphysematous changes of the lungs. There is a patchy area of ground-glass and nodular density at the left lung base  concerning for pneumonia. Right infrahilar and right leg base consolidative changes may represent atelectasis although infiltrate is not excluded. Stop there are multiple bilateral pulmonary nodules scattered throughout the lungs. The largest nodule measures approximately 10 mm in the left upper lobe along the pleural surface (series 4, image 41). A 10 mm subpleural nodular density is noted at the right lung base  (series 4, image 74). There is no pleural effusion or pneumothorax. The central airways are patent. There is advanced atherosclerotic calcification of the thoracic aorta. There is no aneurysmal dilatation or evidence of dissection. The origins of the great vessels of the aortic arch appear patent. Evaluation for pulmonary embolism is limited due to respiratory motion artifact and suboptimal visualization of the peripheral branches of the pulmonary artery. No definite central pulmonary artery embolus identified. V/Q scan may provide better evaluation if there is high clinical concern for PE. Top-normal cardiac size. There is advanced coronary vascular calcification and CABG surgery. No pericardial effusion. Mildly enlarged right hilar lymph nodes measuring approximately 12 mm in short axis. There is no mediastinal adenopathy. The esophagus is grossly unremarkable. An 11 mm calcified nodule noted in the left thyroid gland. There is no axillary adenopathy. The chest wall soft tissues appear unremarkable. There is osteopenia with degenerative changes of the spine. No acute fracture. Median sternotomy wires noted. The visualized upper abdomen appears unremarkable. IMPRESSION: No definite CT evidence of central pulmonary artery embolus. Multiple bilateral pulmonary nodules measuring up to 10 mm. Non-contrast chest CT at 3-6 months is recommended. If the nodules are stable at time of repeat CT, then future CT at 18-24 months (from today's scan) is considered optional for low-risk patients, but is recommended for high-risk patients. This recommendation follows the consensus statement: Guidelines for Management of Incidental Pulmonary Nodules Detected on CT Images: From the Fleischner Society 2017; Radiology 2017; 284:228-243. Patchy left lung base density concerning for pneumonia/infiltrate. Correlation with clinical exam and follow-up to resolution recommended. Mildly enlarged right hilar lymph nodes. Electronically Signed    By: Anner Crete M.D.   On: 08/23/2016 22:20    Medications: I have reviewed the patient's current medications.  Assesment:   Active Problems:   Essential hypertension, benign   Hx of CABG   COPD (chronic obstructive pulmonary disease) (HCC)   PAF (paroxysmal atrial fibrillation) (HCC)   Pneumonia   HCAP (healthcare-associated pneumonia)    Plan:  Medications reviewed Continue combinations iv antibiotics Continue neb treatment Cbc/bmp in AM    LOS: 2 days   Jeny Nield 08/25/2016, 8:11 AM

## 2016-08-25 NOTE — Care Management Important Message (Signed)
Important Message  Patient Details  Name: EMYR PAPALEO MRN: [id number] Date of Birth: [birth year]   Medicare Important Message Given:  Yes    Emmalyne Giacomo, Chauncey Reading, RN 08/25/2016, 11:41 AM

## 2016-08-25 NOTE — Consult Note (Signed)
   [facility] CM Inpatient Consult   08/25/2016  John Sampson 05/16/1936 [id number]   Spoke with patient at bedside regarding [facility] services. Patient does not wish to participate with [facility] at this time, however he may wish to sign up in the future. Patient given [facility] brochure and contact information for future reference, voices appreciation of information.    Inpatient case manager aware that patient offered [facility] case management services but declined.   Of note, [facility] Care Management services would not replace or interfere with any services that are arranged by inpatient case management or social work. For additional questions or referrals please contact:   Royetta Crochet. Laymond Purser, RN, BSN, [facility] Liaison [phone]

## 2016-08-26 ENCOUNTER — Ambulatory Visit (HOSPITAL_COMMUNITY): Payer: Medicare Other | Admitting: Physical Therapy

## 2016-08-26 LAB — BASIC METABOLIC PANEL
ANION GAP: 5 (ref 5–15)
BUN: 17 mg/dL (ref 6–20)
CALCIUM: 9 mg/dL (ref 8.9–10.3)
CO2: 26 mmol/L (ref 22–32)
Chloride: 107 mmol/L (ref 101–111)
Creatinine, Ser: 0.93 mg/dL (ref 0.61–1.24)
GFR calc Af Amer: >60 mL/min (ref >60)
GFR calc non Af Amer: >60 mL/min (ref >60)
GLUCOSE: 94 mg/dL (ref 65–99)
POTASSIUM: 3.5 mmol/L (ref 3.5–5.1)
Sodium: 138 mmol/L (ref 135–145)

## 2016-08-26 LAB — CBC
HEMATOCRIT: 35.8 % — AB (ref 39.0–52.0)
HEMOGLOBIN: 11.2 g/dL — AB (ref 13.0–17.0)
MCH: 29.2 pg (ref 26.0–34.0)
MCHC: 31.3 g/dL (ref 30.0–36.0)
MCV: 93.5 fL (ref 78.0–100.0)
Platelets: 246 10*3/uL (ref 150–400)
RBC: 3.83 MIL/uL — ABNORMAL LOW (ref 4.22–5.81)
RDW: 15.6 % — ABNORMAL HIGH (ref 11.5–15.5)
WBC: 8.7 10*3/uL (ref 4.0–10.5)

## 2016-08-26 LAB — CULTURE, RESPIRATORY: CULTURE: NORMAL

## 2016-08-26 LAB — CULTURE, RESPIRATORY W GRAM STAIN

## 2016-08-26 NOTE — Progress Notes (Signed)
Subjective: Patient is coughing and also congested. Continue to wheeze. No fever or chills. No chest pain, nausea or vomiting.  Objective: Vital signs in last 24 hours: Temp:  [97.5 F (36.4 C)-98.5 F (36.9 C)] 97.7 F (36.5 C) (09/28 0555) Pulse Rate:  [54-63] 55 (09/28 0555) Resp:  [16] 16 (09/28 0555) BP: (96-149)/(37-58) 149/52 (09/28 0555) SpO2:  [95 %-99 %] 98 % (09/28 0738) Weight change:  Last BM Date: 08/25/16  Intake/Output from previous day: 09/27 0701 - 09/28 0700 In: 1130 [P.O.:480; IV Piggyback:650] Out: 200 [Urine:200]  PHYSICAL EXAM General appearance: alert, fatigued and no distress Resp: diminished breath sounds bilaterally and rhonchi bilaterally Cardio: irregularly irregular rhythm GI: soft, non-tender; bowel sounds normal; no masses,  no organomegaly Extremities: extremities normal, atraumatic, no cyanosis or edema  Lab Results:  Results for orders placed or performed during the hospital encounter of 08/23/16 (from the past 48 hour(s))  Basic metabolic panel     Status: None   Collection Time: 08/26/16  6:27 AM  Result Value Ref Range   Sodium 138 135 - 145 mmol/L   Potassium 3.5 3.5 - 5.1 mmol/L   Chloride 107 101 - 111 mmol/L   CO2 26 22 - 32 mmol/L   Glucose, Bld 94 65 - 99 mg/dL   BUN 17 6 - 20 mg/dL   Creatinine, Ser 0.93 0.61 - 1.24 mg/dL   Calcium 9.0 8.9 - 10.3 mg/dL   GFR calc non Af Amer >60 >60 mL/min   GFR calc Af Amer >60 >60 mL/min    Comment: (NOTE) The eGFR has been calculated using the CKD EPI equation. This calculation has not been validated in all clinical situations. eGFR's persistently <60 mL/min signify possible Chronic Kidney Disease.    Anion gap 5 5 - 15  CBC     Status: Abnormal   Collection Time: 08/26/16  6:27 AM  Result Value Ref Range   WBC 8.7 4.0 - 10.5 K/uL   RBC 3.83 (L) 4.22 - 5.81 MIL/uL   Hemoglobin 11.2 (L) 13.0 - 17.0 g/dL   HCT 35.8 (L) 39.0 - 52.0 %   MCV 93.5 78.0 - 100.0 fL   MCH 29.2 26.0 -  34.0 pg   MCHC 31.3 30.0 - 36.0 g/dL   RDW 15.6 (H) 11.5 - 15.5 %   Platelets 246 150 - 400 K/uL    ABGS No results for input(s): PHART, PO2ART, TCO2, HCO3 in the last 72 hours.  Invalid input(s): PCO2 CULTURES Recent Results (from the past 240 hour(s))  Culture, blood (routine x 2) Call MD if unable to obtain prior to antibiotics being given     Status: None (Preliminary result)   Collection Time: 08/24/16 12:57 AM  Result Value Ref Range Status   Specimen Description BLOOD RIGHT HAND  Final   Special Requests   Final    BOTTLES DRAWN AEROBIC AND ANAEROBIC AEB=8CC ANA=4CC   Culture NO GROWTH 1 DAY  Final   Report Status PENDING  Incomplete  Culture, blood (routine x 2) Call MD if unable to obtain prior to antibiotics being given     Status: None (Preliminary result)   Collection Time: 08/24/16  1:11 AM  Result Value Ref Range Status   Specimen Description BLOOD LEFT HAND  Final   Special Requests BOTTLES DRAWN AEROBIC AND ANAEROBIC 4CC EACH  Final   Culture NO GROWTH 1 DAY  Final   Report Status PENDING  Incomplete  Culture, sputum-assessment     Status:  None   Collection Time: 08/24/16  2:45 AM  Result Value Ref Range Status   Specimen Description SPUTUM EXPECTORATED  Final   Special Requests NONE  Final   Sputum evaluation THIS SPECIMEN IS ACCEPTABLE FOR SPUTUM CULTURE  Final   Report Status 08/24/2016 FINAL  Final  Culture, respiratory (NON-Expectorated)     Status: None (Preliminary result)   Collection Time: 08/24/16  2:45 AM  Result Value Ref Range Status   Specimen Description EXPECTORATED SPUTUM  Final   Special Requests NONE  Final   Gram Stain   Final    FEW WBC PRESENT, PREDOMINANTLY PMN FEW SQUAMOUS EPITHELIAL CELLS PRESENT FEW GRAM NEGATIVE RODS FEW GRAM NEGATIVE COCCOBACILLI RARE GRAM NEGATIVE DIPLOCOCCI RARE GRAM POSITIVE COCCI IN PAIRS    Culture   Final    CULTURE REINCUBATED FOR BETTER GROWTH Performed at [facility]    Report Status  PENDING  Incomplete   Studies/Results: Ct Soft Tissue Neck W Contrast  Result Date: 08/25/2016 CLINICAL DATA:  Hemoptysis.  Swelling in the left neck. EXAM: CT NECK WITH CONTRAST TECHNIQUE: Multidetector CT imaging of the neck was performed using the standard protocol following the bolus administration of intravenous contrast. CONTRAST:  3m ISOVUE-300 IOPAMIDOL (ISOVUE-300) INJECTION 61% COMPARISON:  None. FINDINGS: Pharynx and larynx: No focal mucosal or submucosal lesions are present. The nasopharynx, oropharynx, and hypopharynx are clear. Vocal cords are midline and symmetric. Salivary glands: The submandibular and parotid glands are within normal limits bilaterally. Thyroid: In the left lobe of thyroid is enlarged and hypodense compared to the right. A central calcification is noted. There is possible erosion into the subglottic trachea on the left anteriorly. The thyroid cartilage appears to be intact. Lymph nodes: Asymmetric sub cm left level 2 lymph nodes are present. No pathologically enlarged nodes are present. Vascular: Atherosclerotic calcifications are present at the carotid bifurcations bilaterally with less than 50% stenosis. Limited intracranial: Unremarkable Visualized orbits: Not visualize Mastoids and visualized paranasal sinuses: Clear Skeleton: Mild endplate degenerative changes are present throughout the cervical and upper thoracic spine. No focal lytic or blastic lesions are present. Median sternotomy is noted. The patient is edentulous. Upper chest: Emphysematous changes are noted. No focal nodule, mass, or airspace disease is present. Other: IMPRESSION: 1. Asymmetric hypodense mass in the left lobe of thyroid with possible sub tracheal invasion. The lesion may be visible by endoscopy. This lesion is concerning for a thyroid neoplasm. 2. No significant adenopathy or cartilage destruction. 3. No other acute or focal lesion to explain hemoptysis or left-sided neck swelling. 4.  Atherosclerosis. 5. Degenerate changes in the cervical and upper thoracic spine. Electronically Signed   By: CSan MorelleM.D.   On: 08/25/2016 18:13    Medications: I have reviewed the patient's current medications.  Assesment:   Active Problems:   Essential hypertension, benign   Hx of CABG   COPD (chronic obstructive pulmonary disease) (HCC)   PAF (paroxysmal atrial fibrillation) (HCC)   Pneumonia   HCAP (healthcare-associated pneumonia)    Plan:  Medications reviewed Continue combinations iv antibiotics Continue neb treatment    LOS: 3 days   Sam Wunschel 08/26/2016, 8:31 AM

## 2016-08-27 ENCOUNTER — Ambulatory Visit (HOSPITAL_COMMUNITY): Payer: Medicare Other

## 2016-08-27 ENCOUNTER — Ambulatory Visit: Payer: Medicare Other | Admitting: Vascular Surgery

## 2016-08-27 LAB — VANCOMYCIN, TROUGH: VANCOMYCIN TR: 24 ug/mL — AB (ref 15–20)

## 2016-08-27 MED ORDER — AMOXICILLIN-POT CLAVULANATE 500-125 MG PO TABS: 1.0000 | ORAL_TABLET | Freq: Three times a day (TID) | ORAL | 0 refills | Status: DC

## 2016-08-27 MED ORDER — VANCOMYCIN HCL IN DEXTROSE 1-5 GM/200ML-% IV SOLN: 1000.0000 mg | Freq: Two times a day (BID) | INTRAVENOUS | Status: DC

## 2016-08-27 NOTE — Discharge Summary (Signed)
Physician Discharge Summary  Patient ID: John Sampson MRN: [id number] DOB/AGE: 1936-06-23 80 y.o. Primary Care Physician:HAWKINS,EDWARD L, MD Admit date: 08/23/2016 Discharge date: 08/27/2016    Discharge Diagnoses:   Active Problems:   Essential hypertension, benign   Hx of CABG   COPD (chronic obstructive pulmonary disease) (HCC)   PAF (paroxysmal atrial fibrillation) (HCC)   Pneumonia   HCAP (healthcare-associated pneumonia)     Medication List    TAKE these medications   acetaminophen 325 MG tablet Commonly known as:  TYLENOL Take 650 mg by mouth daily as needed for headache.   albuterol 108 (90 Base) MCG/ACT inhaler Commonly known as:  PROVENTIL HFA Inhale 2 puffs into the lungs every 6 (six) hours as needed for wheezing or shortness of breath.   amoxicillin-clavulanate 500-125 MG tablet Commonly known as:  AUGMENTIN Take 1 tablet (500 mg total) by mouth 3 (three) times daily.   apixaban 5 MG Tabs tablet Commonly known as:  ELIQUIS Take 1 tablet (5 mg total) by mouth 2 (two) times daily.   diltiazem 240 MG 24 hr capsule Commonly known as:  CARDIZEM CD Take 1 capsule (240 mg total) by mouth daily.   docusate sodium 100 MG capsule Commonly known as:  COLACE Take 200 mg by mouth at bedtime.   doxycycline 100 MG tablet Commonly known as:  VIBRA-TABS Take 1 tablet (100 mg total) by mouth every 12 (twelve) hours.   ferrous sulfate 325 (65 FE) MG tablet Take 1 tablet (325 mg total) by mouth 2 (two) times daily with a meal.   fish oil-omega-3 fatty acids 1000 MG capsule Take 1 g by mouth 2 (two) times daily.   folic acid 1 MG tablet Commonly known as:  FOLVITE Take 1 mg by mouth daily.   furosemide 20 MG tablet Commonly known as:  LASIX Take 1 tablet (20 mg total) by mouth daily.   lisinopril 10 MG tablet Commonly known as:  PRINIVIL,ZESTRIL Take 1 tablet (10 mg total) by mouth daily.   metoprolol succinate 50 MG 24 hr tablet Commonly known as:   TOPROL-XL Take 50 mg by mouth 2 (two) times daily. Take with or immediately following a meal.   nitroGLYCERIN 0.4 MG SL tablet Commonly known as:  NITROSTAT Place 1 tablet (0.4 mg total) under the tongue every 5 (five) minutes as needed for chest pain.   omeprazole 20 MG capsule Commonly known as:  PRILOSEC Take 20 mg by mouth 2 (two) times daily before a meal.   potassium chloride SA 20 MEQ tablet Commonly known as:  K-DUR,KLOR-CON Take 20 mEq by mouth daily.   simvastatin 40 MG tablet Commonly known as:  ZOCOR Take 0.5 tablets (20 mg total) by mouth at bedtime.       Discharged Condition: home    Consults: none  Significant Diagnostic Studies: Dg Chest 2 View  Result Date: 08/23/2016 CLINICAL DATA:  Coughing up bloody sputum for 4 days. Generalized weakness. EXAM: CHEST  2 VIEW COMPARISON:  02/14/2013 FINDINGS: Mildly elevated right hemidiaphragm. Prior CABG. Mild enlargement of the cardiopericardial silhouette. Atherosclerotic calcification of the aortic arch. Mild upper zone vascular prominence without overt edema. No pleural effusion. Bony demineralization. IMPRESSION: 1. Mild enlargement of the cardiopericardial silhouette with suspected pulmonary venous hypertension but no overt edema. 2. Elevated right hemidiaphragm. 3. Prior CABG. 4. Atherosclerotic calcification of the aortic arch. 5. If the patient is experiencing true hemoptysis, chest CT may be warranted. Electronically Signed   By: Cindra Eves.D.  On: 08/23/2016 20:14   Ct Soft Tissue Neck W Contrast  Result Date: 08/25/2016 CLINICAL DATA:  Hemoptysis.  Swelling in the left neck. EXAM: CT NECK WITH CONTRAST TECHNIQUE: Multidetector CT imaging of the neck was performed using the standard protocol following the bolus administration of intravenous contrast. CONTRAST:  30mL ISOVUE-300 IOPAMIDOL (ISOVUE-300) INJECTION 61% COMPARISON:  None. FINDINGS: Pharynx and larynx: No focal mucosal or submucosal lesions are  present. The nasopharynx, oropharynx, and hypopharynx are clear. Vocal cords are midline and symmetric. Salivary glands: The submandibular and parotid glands are within normal limits bilaterally. Thyroid: In the left lobe of thyroid is enlarged and hypodense compared to the right. A central calcification is noted. There is possible erosion into the subglottic trachea on the left anteriorly. The thyroid cartilage appears to be intact. Lymph nodes: Asymmetric sub cm left level 2 lymph nodes are present. No pathologically enlarged nodes are present. Vascular: Atherosclerotic calcifications are present at the carotid bifurcations bilaterally with less than 50% stenosis. Limited intracranial: Unremarkable Visualized orbits: Not visualize Mastoids and visualized paranasal sinuses: Clear Skeleton: Mild endplate degenerative changes are present throughout the cervical and upper thoracic spine. No focal lytic or blastic lesions are present. Median sternotomy is noted. The patient is edentulous. Upper chest: Emphysematous changes are noted. No focal nodule, mass, or airspace disease is present. Other: IMPRESSION: 1. Asymmetric hypodense mass in the left lobe of thyroid with possible sub tracheal invasion. The lesion may be visible by endoscopy. This lesion is concerning for a thyroid neoplasm. 2. No significant adenopathy or cartilage destruction. 3. No other acute or focal lesion to explain hemoptysis or left-sided neck swelling. 4. Atherosclerosis. 5. Degenerate changes in the cervical and upper thoracic spine. Electronically Signed   By: San Morelle M.D.   On: 08/25/2016 18:13   Ct Angio Chest Pe W And/or Wo Contrast  Result Date: 08/23/2016 CLINICAL DATA:  80 year old male with hemoptysis. EXAM: CT ANGIOGRAPHY CHEST WITH CONTRAST TECHNIQUE: Multidetector CT imaging of the chest was performed using the standard protocol during bolus administration of intravenous contrast. Multiplanar CT image reconstructions  and MIPs were obtained to evaluate the vascular anatomy. CONTRAST:  100 cc Isovue 370 COMPARISON:  Chest radiograph dated 08/23/2016 FINDINGS: Evaluation of this exam is limited due to respiratory motion artifact. There is emphysematous changes of the lungs. There is a patchy area of ground-glass and nodular density at the left lung base concerning for pneumonia. Right infrahilar and right leg base consolidative changes may represent atelectasis although infiltrate is not excluded. Stop there are multiple bilateral pulmonary nodules scattered throughout the lungs. The largest nodule measures approximately 10 mm in the left upper lobe along the pleural surface (series 4, image 41). A 10 mm subpleural nodular density is noted at the right lung base (series 4, image 74). There is no pleural effusion or pneumothorax. The central airways are patent. There is advanced atherosclerotic calcification of the thoracic aorta. There is no aneurysmal dilatation or evidence of dissection. The origins of the great vessels of the aortic arch appear patent. Evaluation for pulmonary embolism is limited due to respiratory motion artifact and suboptimal visualization of the peripheral branches of the pulmonary artery. No definite central pulmonary artery embolus identified. V/Q scan may provide better evaluation if there is high clinical concern for PE. Top-normal cardiac size. There is advanced coronary vascular calcification and CABG surgery. No pericardial effusion. Mildly enlarged right hilar lymph nodes measuring approximately 12 mm in short axis. There is no mediastinal adenopathy. The  esophagus is grossly unremarkable. An 11 mm calcified nodule noted in the left thyroid gland. There is no axillary adenopathy. The chest wall soft tissues appear unremarkable. There is osteopenia with degenerative changes of the spine. No acute fracture. Median sternotomy wires noted. The visualized upper abdomen appears unremarkable. IMPRESSION: No  definite CT evidence of central pulmonary artery embolus. Multiple bilateral pulmonary nodules measuring up to 10 mm. Non-contrast chest CT at 3-6 months is recommended. If the nodules are stable at time of repeat CT, then future CT at 18-24 months (from today's scan) is considered optional for low-risk patients, but is recommended for high-risk patients. This recommendation follows the consensus statement: Guidelines for Management of Incidental Pulmonary Nodules Detected on CT Images: From the Fleischner Society 2017; Radiology 2017; 284:228-243. Patchy left lung base density concerning for pneumonia/infiltrate. Correlation with clinical exam and follow-up to resolution recommended. Mildly enlarged right hilar lymph nodes. Electronically Signed   By: Anner Crete M.D.   On: 08/23/2016 22:20    Lab Results: Basic Metabolic Panel:  Recent Labs  08/26/16 0627  NA 138  K 3.5  CL 107  CO2 26  GLUCOSE 94  BUN 17  CREATININE 0.93  CALCIUM 9.0   Liver Function Tests: No results for input(s): AST, ALT, ALKPHOS, BILITOT, PROT, ALBUMIN in the last 72 hours.   CBC:  Recent Labs  08/26/16 0627  WBC 8.7  HGB 11.2*  HCT 35.8*  MCV 93.5  PLT 246    Recent Results (from the past 240 hour(s))  Culture, blood (routine x 2) Call MD if unable to obtain prior to antibiotics being given     Status: None (Preliminary result)   Collection Time: 08/24/16 12:57 AM  Result Value Ref Range Status   Specimen Description BLOOD RIGHT HAND  Final   Special Requests   Final    BOTTLES DRAWN AEROBIC AND ANAEROBIC AEB=8CC ANA=4CC   Culture NO GROWTH 2 DAYS  Final   Report Status PENDING  Incomplete  Culture, blood (routine x 2) Call MD if unable to obtain prior to antibiotics being given     Status: None (Preliminary result)   Collection Time: 08/24/16  1:11 AM  Result Value Ref Range Status   Specimen Description BLOOD LEFT HAND  Final   Special Requests BOTTLES DRAWN AEROBIC AND ANAEROBIC 4CC EACH   Final   Culture NO GROWTH 2 DAYS  Final   Report Status PENDING  Incomplete  Culture, sputum-assessment     Status: None   Collection Time: 08/24/16  2:45 AM  Result Value Ref Range Status   Specimen Description SPUTUM EXPECTORATED  Final   Special Requests NONE  Final   Sputum evaluation THIS SPECIMEN IS ACCEPTABLE FOR SPUTUM CULTURE  Final   Report Status 08/24/2016 FINAL  Final  Culture, respiratory (NON-Expectorated)     Status: None   Collection Time: 08/24/16  2:45 AM  Result Value Ref Range Status   Specimen Description EXPECTORATED SPUTUM  Final   Special Requests NONE  Final   Gram Stain   Final    FEW WBC PRESENT, PREDOMINANTLY PMN FEW SQUAMOUS EPITHELIAL CELLS PRESENT FEW GRAM NEGATIVE RODS FEW GRAM NEGATIVE COCCOBACILLI RARE GRAM NEGATIVE DIPLOCOCCI RARE GRAM POSITIVE COCCI IN PAIRS    Culture   Final    Consistent with normal respiratory flora. Performed at [facility]    Report Status 08/26/2016 FINAL  Final     Hospital Course:   This is a 80 years old male  patient who was admitted to cough, congestion and hemoptysis. patient was found to have  Pneumonia. He was treated with combinations of IV antibiotics. He also received nebulizer treatment. Over the hospital stay patient improved and he is being discharged on oral antibiotics.  Discharge Exam: Blood pressure 120/82, pulse 64, temperature 98.6 F (37 C), temperature source Oral, resp. rate 16, height 5\' 9"  (1.753 m), weight 96.4 kg (212 lb 8 oz), SpO2 95 %.   Disposition:  home    Follow-up Information    HAWKINS,EDWARD L, MD Follow up in 2 week(s).   Specialty:  Pulmonary Disease Contact information: [place] [address] [phone]           Signed: Exa Bomba   08/27/2016, 8:33 AM

## 2016-08-27 NOTE — Progress Notes (Signed)
CRITICAL VALUE ALERT  Critical value received:  vanc level 24  Date of notification:  08/27/2016  Time of notification:  0805  Critical value read back: yes  Nurse who received alert:  Sharen Hones, RN  MD notified (1st page):  Mickel Baas with Pharmacy  Time of first page:  0803  MD notified (2nd page):  Time of second page:  Responding MD:  Mickel Baas pharmacy  Time MD responded: [phone]  New orders given to hold the dose of van for now and they will re-evaluate.

## 2016-08-27 NOTE — Progress Notes (Signed)
Patient discharged with instructions, prescription, and care notes.  Verbalized understanding via teach back.  IV was removed and the site was WNL. Patient voiced no further complaints or concerns at the time of discharge.  Appointments scheduled per instructions.  Patient left the floor via w/c family  And staff in stable condition. 

## 2016-08-27 NOTE — Progress Notes (Signed)
Pharmacy Antibiotic Note  John Sampson is a 80 y.o. male admitted on 08/23/2016 with health-care associated pneumonia.  Pharmacy has been consulted for vancomycin dosing and antibiotic renal dose adjustment . Patient was recently discharged from hospital on 07/25/2016 after he was treated for hand cellulitis with doxycycline 100 mg po bid.  Trough level is above goal.  Renal fxn is stable.    Plan: Reduce Vancomycin to 1000mg  IV q12hrs Check Vancomycin trough level weekly or sooner if warranted Continue Cefepime 1 gram IV Q8hr. Duration of therapy per MD (anticipate 8 days total) Monitor labs, renal fxn, progress and c/s  Height: 5\' 9"  (175.3 cm) Weight: 212 lb 8 oz (96.4 kg) IBW/kg (Calculated) : 70.7 kg  Temp (24hrs), Avg:98.6 F (37 C), Min:98.3 F (36.8 C), Max:98.8 F (37.1 C)   Recent Labs Lab 08/23/16 1750 08/24/16 0606 08/26/16 0627 08/27/16 0653  WBC 11.3* 11.8* 8.7  --   CREATININE 0.89 0.85 0.93  --   VANCOTROUGH  --   --   --  24*    Estimated Creatinine Clearance: 75 mL/min (by C-G formula based on SCr of 0.93 mg/dL).    CT scan showed left lower lobe infiltrate, consistent with pneumonia and multiple bilateral pulmonary nodules.  No Known Allergies   Pharmacokinetic dosing service: Vancomycin single level analysis: Current dose being given: 1250 mg Current dosing interval:  12 hrs  Single level Trough Data:  Trough level obtained: 24 mcg/ml  Estimated PK Parameters: --------------------------- New rate constant (kel): 0.050 hr-1 New half-life: 13.86 Hours New Vd from levels: 67.48  Liters  (0.7 L/kg)  Patient response:  Patient is responding to treatment, renal fxn is stable  Recommendations: ==================== Give Vancomycin  1000 mg  IV q 12 hrs. Expected Ctrough: 18.5 mcg/ml  Recommended labs and intervals: Measure Bun and Scr 3 times/week.   Antimicrobials this admission: Zosyn 3.375 grams IV x 1  08/23/16 Vancomycin 1 gram given in  ED then 1250 mg IV Q12hr 08/23/16 >>  Cefepime 1 gram IV Q8hr 08/24/16 >>  Dose adjustments this admission: 9/29:  Dose adjusted due to trough level above goal:  1000mg  IV q12hrs  Microbiology results: 08/24/16 BCx: pending 08/24/16 Sputum: pending  08/24/16 Strep Pneumoniae urinary antigen: pending  Thank you for allowing pharmacy to be a part of this patient's care.  Hart Robinsons A 08/27/2016 8:43 AM

## 2016-08-29 LAB — CULTURE, BLOOD (ROUTINE X 2)
CULTURE: NO GROWTH
Culture: NO GROWTH

## 2016-08-31 ENCOUNTER — Encounter: Payer: Self-pay | Admitting: Vascular Surgery

## 2016-09-06 ENCOUNTER — Encounter (HOSPITAL_COMMUNITY): Payer: Medicare Other

## 2016-09-06 ENCOUNTER — Ambulatory Visit: Payer: Medicare Other | Admitting: Vascular Surgery

## 2016-09-07 ENCOUNTER — Ambulatory Visit (HOSPITAL_COMMUNITY): Admission: RE | Admit: 2016-09-07 | Payer: Medicare Other | Source: Ambulatory Visit

## 2016-09-07 DIAGNOSIS — C320 Malignant neoplasm of glottis: Secondary | ICD-10-CM | POA: Diagnosis not present

## 2016-09-07 DIAGNOSIS — R490 Dysphonia: Secondary | ICD-10-CM | POA: Diagnosis not present

## 2016-09-08 ENCOUNTER — Telehealth: Payer: Self-pay | Admitting: Cardiology

## 2016-09-08 NOTE — Telephone Encounter (Signed)
Dr. Radene Journey called and lvm requesting surgical clearance for this pt and would like to speak w/ Dr. Domenic Polite. Please give him a call @ [phone]

## 2016-09-08 NOTE — Telephone Encounter (Signed)
Will forward to Dr. McDowell 

## 2016-09-08 NOTE — Telephone Encounter (Signed)
Dr. Radene Journey called and lvm requesting surgical clearance for this pt and would like to speak w/ Dr. Domenic Polite. Please give him a call @ [phone]

## 2016-09-09 ENCOUNTER — Ambulatory Visit: Payer: Self-pay | Admitting: Otolaryngology

## 2016-09-09 NOTE — Telephone Encounter (Signed)
I spoke directly with Dr. Lucia Gaskins today. Patient is to undergo thyroid surgery next week due to suspected malignancy. I saw John Sampson back in June and he was clinically stable from a cardiac perspective without angina symptoms on medical therapy (see office note). He has a history of multivessel CAD status post CABG in 2003, negative ischemic workup in 2013. LVEF is 55% by most recent evaluation and he has been stable in terms of diastolic heart failure control on low-dose Lasix. He does have a history of atypical atrial flutter with previous cardioversion in July 2016. He is on Eliquis for stroke prophylaxis. Eliquis can be held 48 hours prior to planned surgery, and then resumed within a few days after surgery if hemostasis achieved. I do not anticipate that he needs further ischemic testing at this time in light of his clinical stability.

## 2016-09-09 NOTE — H&P (Signed)
PREOPERATIVE H&P  Chief Complaint: hoarseness  HPI: John Sampson is a 80 y.o. male who presents for evaluation of hoarseness he's had since a hospitalization he had in August. On exam in the office he had a paralyzed left TVC. Subsequent CT scan of the neck revealed a left thyroid mass that was suspicious for malignancy. He's taken to the OR at this time for total thyroidectomy and possible left vocal cord larygoplasty. I discussed cardiac clearance with Dr Domenic Polite and patient OK to stop Eiquis.  Past Medical History:  Diagnosis Date  . Atrial flutter ([place])   . COPD (chronic obstructive pulmonary disease) ([place]) 3.12.14   2D Echo, EF 50-55%  . Coronary atherosclerosis of native coronary artery    Multivessel status post CABG  . Gastric ulcer    Small - nonbleeding  . GERD (gastroesophageal reflux disease)   . Headache(784.0)   . Iron deficiency anemia    Negative Givens capsule study   . Myocardial infarction   . PAD (peripheral artery disease) (HCC)    Moderate bilateral SFA disease at angiography 01/2013  . 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 ABDOMINAL AORTAGRAM N/A 02/19/2013   Procedure: ABDOMINAL Maxcine Ham;  Surgeon: [name], MD;  Location: [facility] CATH LAB;  Service: Cardiovascular;  Laterality: N/A;  . CARDIOVERSION N/A 06/16/2015   Procedure: CARDIOVERSION;  Surgeon: Satira Sark, MD;  Location: [facility] ORS;  Service: Cardiovascular;  Laterality: N/A;  . COLONOSCOPY  08/18/2012   Procedure: COLONOSCOPY;  Surgeon: [name], MD;  Location: [facility] ENDO SUITE;  Service: Endoscopy;  Laterality: N/A;  1:25  . CORONARY ARTERY BYPASS GRAFT  2003   5 grafts - details not clear  . CRANIOTOMY  12/31/2011   Procedure: CRANIOTOMY HYPOPHYSECTOMY TRANSNASAL APPROACH;  Surgeon: [name], MD;  Location: [place] NEURO ORS;  Service: Neurosurgery;  Laterality: N/A;  Transphenoidal Hypophysectomy With Fat Graft Harvest from right abdomen   .  ESOPHAGOGASTRODUODENOSCOPY  08/18/2012   Procedure: ESOPHAGOGASTRODUODENOSCOPY (EGD);  Surgeon: [name], MD;  Location: [facility] ENDO SUITE;  Service: Endoscopy;  Laterality: N/A;  . EYE SURGERY  2012  . GIVENS CAPSULE STUDY N/A 01/25/2013   Procedure: GIVENS CAPSULE STUDY;  Surgeon: [name], MD;  Location: [facility] END[facility];  Service: Endoscopy;  Laterality: N/A;  730  . LOWER EXTREMITY ANGIOGRAM N/A 02/19/2013   Procedure: LOWER EXTREMITY ANGIOGRAM;  Surgeon: [name], MD;  Location: [facility] CATH LAB;  Service: Cardiovascular;  Laterality: N/A;  . PERIPHERAL VASCULAR CATHETERIZATION N/A 01/19/2016   Procedure: Abdominal Aortogram;  Surgeon: Conrad Roxobel, MD;  Location: [facility] CV LAB;  Service: Cardiovascular;  Laterality: N/A;  . PERIPHERAL VASCULAR CATHETERIZATION Bilateral 01/19/2016   Procedure: Lower Extremity Angiography;  Surgeon: Conrad Leeper, MD;  Location: [place] CV LAB;  Service: Cardiovascular;  Laterality: Bilateral;  . PV Angiogram  02/19/13   Indications: slow healing left calf ulcer  . Stress Myocardial Perfusion  12/08/2011   Indications: Abnormal EKG, Eval of prior GABG  . TEE WITHOUT CARDIOVERSION N/A 06/16/2015   Procedure: TRANSESOPHAGEAL ECHOCARDIOGRAM (TEE);  Surgeon: Satira Sark, MD;  Location: [facility] ORS;  Service: Cardiovascular;  Laterality: N/A;  . TONSILLECTOMY  Age 15   Social History   Social History  . Marital status: Widowed    Spouse name: N/A  . Number of children: N/A  . Years of education: N/A   Social History Main Topics  .  Smoking status: Former Smoker    Packs/day: 3.00    Years: 45.00    Types: Cigarettes    Start date: 01/24/1955    Quit date: 01/23/2002  . Smokeless tobacco: Never Used     Comment: Quit 11 yrs ago  . Alcohol use No  . Drug use: No  . Sexual activity: No   Other Topics Concern  . Not on file   Social History Narrative  . No narrative on file   Family History  Problem Relation Age of Onset  . CAD  Mother   . Heart disease Father     before age 31   No Known Allergies Prior to Admission medications   Medication Sig Start Date End Date Taking? Authorizing Provider  acetaminophen (TYLENOL) 325 MG tablet Take 650 mg by mouth daily as needed for headache.    Historical Provider, MD  albuterol (PROVENTIL HFA) 108 (90 BASE) MCG/ACT inhaler Inhale 2 puffs into the lungs every 6 (six) hours as needed for wheezing or shortness of breath. 11/15/14   Satira Sark, MD  amoxicillin-clavulanate (AUGMENTIN) 500-125 MG tablet Take 1 tablet (500 mg total) by mouth 3 (three) times daily. 08/27/16   Rosita Fire, MD  apixaban (ELIQUIS) 5 MG TABS tablet Take 1 tablet (5 mg total) by mouth 2 (two) times daily. 12/24/15   Erlene Quan, PA-C  diltiazem (CARDIZEM CD) 240 MG 24 hr capsule Take 1 capsule (240 mg total) by mouth daily. 05/30/15   Lendon Colonel, NP  docusate sodium (COLACE) 100 MG capsule Take 200 mg by mouth at bedtime.    Historical Provider, MD  doxycycline (VIBRA-TABS) 100 MG tablet Take 1 tablet (100 mg total) by mouth every 12 (twelve) hours. Patient not taking: Reported on 08/23/2016 07/25/16   Sinda Du, MD  ferrous sulfate 325 (65 FE) MG tablet Take 1 tablet (325 mg total) by mouth 2 (two) times daily with a meal. 01/23/13   [name], MD  fish oil-omega-3 fatty acids 1000 MG capsule Take 1 g by mouth 2 (two) times daily.     Historical Provider, MD  folic acid (FOLVITE) 1 MG tablet Take 1 mg by mouth daily.     Historical Provider, MD  furosemide (LASIX) 20 MG tablet Take 1 tablet (20 mg total) by mouth daily. 03/01/16   Satira Sark, MD  lisinopril (PRINIVIL,ZESTRIL) 10 MG tablet Take 1 tablet (10 mg total) by mouth daily. 05/30/15   Lendon Colonel, NP  metoprolol succinate (TOPROL-XL) 50 MG 24 hr tablet Take 50 mg by mouth 2 (two) times daily. Take with or immediately following a meal.    Historical Provider, MD  nitroGLYCERIN (NITROSTAT) 0.4 MG SL tablet Place 1  tablet (0.4 mg total) under the tongue every 5 (five) minutes as needed for chest pain. 06/07/14   Satira Sark, MD  omeprazole (PRILOSEC) 20 MG capsule Take 20 mg by mouth 2 (two) times daily before a meal.    Historical Provider, MD  potassium chloride SA (K-DUR,KLOR-CON) 20 MEQ tablet Take 20 mEq by mouth daily.    Historical Provider, MD  simvastatin (ZOCOR) 40 MG tablet Take 0.5 tablets (20 mg total) by mouth at bedtime. 07/25/16   Sinda Du, MD     Positive ROS: hoarseness, no dysphagia  All other systems have been reviewed and were otherwise negative with the exception of those mentioned in the HPI and as above.  Physical Exam: There were no vitals filed for  this visit.  General: Alert, no acute distress Oral: Normal oral mucosa and tonsils Nasal: Clear nasal passages  FOL paralyzed left TVC Neck: Thick short neck with vague left thyroid fullness Ear: Ear canal is clear with normal appearing TMs Cardiovascular: Regular rate and rhythm, no murmur.  Respiratory: Clear to auscultation Neurologic: Alert and oriented x 3   Assessment/Plan: thyroid cancer Plan for Procedure(s): TOTAL THYROIDECTOMY Larygoplasty medialization OF LEFT VOCAL CORD   Melony Overly, MD 09/09/2016 10:30 AM

## 2016-09-10 ENCOUNTER — Encounter (HOSPITAL_COMMUNITY): Payer: Self-pay

## 2016-09-10 ENCOUNTER — Encounter (HOSPITAL_COMMUNITY)
Admission: RE | Admit: 2016-09-10 | Discharge: 2016-09-10 | Disposition: A | Discharge status: Home / Self Care | Payer: Medicare Other | Source: Ambulatory Visit | Attending: Otolaryngology | Admitting: Otolaryngology

## 2016-09-10 DIAGNOSIS — Z01818 Encounter for other preprocedural examination: Secondary | ICD-10-CM | POA: Diagnosis not present

## 2016-09-10 LAB — CBC
HEMATOCRIT: 39.3 % (ref 39.0–52.0)
Hemoglobin: 12.1 g/dL — ABNORMAL LOW (ref 13.0–17.0)
MCH: 28.7 pg (ref 26.0–34.0)
MCHC: 30.8 g/dL (ref 30.0–36.0)
MCV: 93.3 fL (ref 78.0–100.0)
Platelets: 290 10*3/uL (ref 150–400)
RBC: 4.21 MIL/uL — AB (ref 4.22–5.81)
RDW: 15.1 % (ref 11.5–15.5)
WBC: 7.8 10*3/uL (ref 4.0–10.5)

## 2016-09-10 LAB — BASIC METABOLIC PANEL
ANION GAP: 10 (ref 5–15)
BUN: 14 mg/dL (ref 6–20)
CHLORIDE: 109 mmol/L (ref 101–111)
CO2: 21 mmol/L — ABNORMAL LOW (ref 22–32)
Calcium: 9.8 mg/dL (ref 8.9–10.3)
Creatinine, Ser: 1.12 mg/dL (ref 0.61–1.24)
GFR calc non Af Amer: >60 mL/min (ref >60)
Glucose, Bld: 101 mg/dL — ABNORMAL HIGH (ref 65–99)
POTASSIUM: 4.3 mmol/L (ref 3.5–5.1)
SODIUM: 140 mmol/L (ref 135–145)

## 2016-09-10 NOTE — Progress Notes (Signed)
Anesthesia Chart Review:  Pt is a 80 year old male scheduled for total thyroidectomy, myringoplasty of the left vocal cord on 09/14/2016 with Melony Overly, M.D.  - Cardiologist is Rozann Lesches, MD who has cleared patient for surgery.  - PCP is Sinda Du, MD  PMH includes:  CAD (s/p CABG 2003), atrial flutter, PAD, iron deficiency anemia, pituitary macroadenoma, COPD, GERD. Former smoker. BMI 32.  Pt hospitalized 9/25-9/29/17 for pneumonia.  Medications include: Albuterol, eliquis, diltiazem, iron, Lasix, lisinopril, metoprolol, Prilosec, potassium, simvastatin. Patient to hold eliquis 48 hours prior to surgery.  Preoperative labs reviewed.  PT/INR will be obtained DOS.  CXR 08/23/16:  1. Mild enlargement of the cardiopericardial silhouette with suspected pulmonary venous hypertension but no overt edema. 2. Elevated right hemidiaphragm. 3. Prior CABG. 4. Atherosclerotic calcification of the aortic arch. 5. If the patient is experiencing true hemoptysis, chest CT may be warranted.  CT angio chest 08/23/16:  1. No definite CT evidence of central pulmonary artery embolus. 2. Multiple bilateral pulmonary nodules measuring up to 10 mm. Non-contrast chest CT at 3-6 months is recommended. If the nodules are stable at time of repeat CT, then future CT at 18-24 months (from today's scan) is considered optional for low-risk patients, but is recommended for high-risk patients.  3. Patchy left lung base density concerning for pneumonia/infiltrate. Correlation with clinical exam and follow-up to resolution recommended. 4. Mildly enlarged right hilar lymph nodes.  EKG 08/23/16: Sinus rhythm. Abnormal R-wave progression, early transition. Repol abnrm suggests ischemia, anterolateral. Minimal ST elevation, inferior leads.   Echo 01/01/16:  - Left ventricle: The cavity size was normal. Wall thickness was increased in a pattern of moderate LVH. The estimated ejection fraction was 55%. Probable  hypokinesis of the basal inferior myocardium. Doppler parameters are consistent with restrictive physiology, indicative of decreased left ventricular diastolic compliance and/or increased left atrial pressure. - Aortic valve: Mildly calcified annulus. Trileaflet; mildly calcified leaflets. - Mitral valve: Calcified annulus. There was mild regurgitation. - Left atrium: The atrium was severely dilated. - Right atrium: Central venous pressure (est): 3 mm Hg. - Tricuspid valve: There was mild regurgitation. - Pulmonary arteries: Systolic pressure was moderately increased. PA peak pressure: 53 mm Hg (S). - Pericardium, extracardiac: There was no pericardial effusion.  Nuclear stress test 12/08/11:  1. There is a moderate perfusion defect due to infarct/scar with mild peri-infarct ischemia seen in the basal inferior lateral and mid inferior lateral regions. The post-rest LV is somewhat enlarged in size the rest LVEF somewhat enlarged in size 2. The post-rest EF is 43%. Global LV systolic function is mildly reduced. Wall motion abnormalities: Moderate hypokinesis in the basal inferior lateral and mid inferior lateral regions. 3. Abnormal myocardial perfusion study. This is a low risk scan. A similar defect is described in reports from 2007 and 2009.  If no changes, I anticipate pt can proceed with surgery as scheduled.   Willeen Cass, FNP-BC [facility]/Anesthesiology Phone: [phone] 09/10/2016 4:26 PM

## 2016-09-10 NOTE — Pre-Procedure Instructions (Signed)
JAIME LOMBARDI  09/10/2016      [name] - [place], [place] - [address] [place] [address] Phone: [phone] Fax: [phone]  [place], [place] - [id number] [place] [address] [place] [address] [place] [address] Phone: [phone] Fax: [phone]    Your procedure is scheduled on October 17  Report to [place] at [place].M.  Call this number if you have problems the morning of surgery:  [phone]   Remember:  Do not eat food or drink liquids after midnight.   Take these medicines the morning of surgery with A SIP OF WATER acetaminophen (TYLENOL), albuterol (PROVENTIL HFA), diltiazem (CARDIZEM CD),  metoprolol succinate (TOPROL-XL) ,  nitroGLYCERIN (NITROSTAT) if needed , omeprazole (PRILOSEC),   7 days prior to surgery STOP taking any Aspirin, Aleve, Naproxen, Ibuprofen, Motrin, Advil, Goody's, BC's, all herbal medications, fish oil, and all vitamins   Last Elquilis on Saturday October 14 do not take any more does starting on Sunday October 15   Do not wear jewelry.  Do not wear lotions, powders, or cologne, or deoderant.  Men may shave face and neck.  Do not bring valuables to the hospital.  [facility] is not responsible for any belongings or valuables.  Contacts, dentures or bridgework may not be worn into surgery.  Leave your suitcase in the car.  After surgery it may be brought to your room.  For patients admitted to the hospital, discharge time will be determined by your treatment team.  Patients discharged the day of surgery will not be allowed to drive home.    Special instructions:   [facility]- Preparing For Surgery  Before surgery, you can play an important role. Because skin is not sterile, your skin needs to be as free of germs as possible. You can reduce the number of germs on your skin by washing with CHG (chlorahexidine gluconate) Soap before surgery.  CHG is an  antiseptic cleaner which kills germs and bonds with the skin to continue killing germs even after washing.  Please do not use if you have an allergy to CHG or antibacterial soaps. If your skin becomes reddened/irritated stop using the CHG.  Do not shave (including legs and underarms) for at least 48 hours prior to first CHG shower. It is OK to shave your face.  Please follow these instructions carefully.   1. Shower the NIGHT BEFORE SURGERY and the MORNING OF SURGERY with CHG.   2. If you chose to wash your hair, wash your hair first as usual with your normal shampoo.  3. After you shampoo, rinse your hair and body thoroughly to remove the shampoo.  4. Use CHG as you would any other liquid soap. You can apply CHG directly to the skin and wash gently with a scrungie or a clean washcloth.   5. Apply the CHG Soap to your body ONLY FROM THE NECK DOWN.  Do not use on open wounds or open sores. Avoid contact with your eyes, ears, mouth and genitals (private parts). Wash genitals (private parts) with your normal soap.  6. Wash thoroughly, paying special attention to the area where your surgery will be performed.  7. Thoroughly rinse your body with warm water from the neck down.  8. DO NOT shower/wash with your normal soap after using and rinsing off the CHG Soap.  9. Pat yourself dry with a CLEAN TOWEL.   10. Wear CLEAN  PAJAMAS   11. Place CLEAN SHEETS on your bed the night of your first shower and DO NOT SLEEP WITH PETS.    Day of Surgery: Do not apply any deodorants/lotions. Please wear clean clothes to the hospital/surgery center.      Please read over the following fact sheets that you were given. Coughing and Deep Breathing and Surgical Site Infection Prevention

## 2016-09-10 NOTE — Progress Notes (Addendum)
PCP - Sinda Du Cardiologist - Domenic Polite - clearance in epic  Chest x-ray - 08/23/16 within one month of surgery EKG - 08/23/16 Stress Test - 12/08/11 ECHO - 01/01/16 Cardiac Cath - 01/03/02  Last dose of Elquilis on October 14 will need Pt/INR DOS     Patient denies shortness of breath, fever, cough and chest pain at PAT appointment

## 2016-09-13 ENCOUNTER — Encounter (HOSPITAL_COMMUNITY): Payer: Self-pay | Admitting: Certified Registered Nurse Anesthetist

## 2016-09-13 ENCOUNTER — Telehealth: Payer: Self-pay

## 2016-09-13 NOTE — Telephone Encounter (Signed)
I have notified patient that his shipment of Eliquis 5mg  has arrived at the [facility]. He lives in [place], and is inquiring if we can ship it to him.

## 2016-09-14 ENCOUNTER — Inpatient Hospital Stay (HOSPITAL_COMMUNITY)
Admission: RE | Admit: 2016-09-14 | Discharge: 2016-09-16 | DRG: 627 | Disposition: A | Discharge status: Home / Self Care | Payer: Medicare Other | Source: Ambulatory Visit | Attending: Otolaryngology | Admitting: Otolaryngology

## 2016-09-14 ENCOUNTER — Inpatient Hospital Stay (HOSPITAL_COMMUNITY): Payer: Medicare Other | Admitting: Certified Registered Nurse Anesthetist

## 2016-09-14 ENCOUNTER — Encounter (HOSPITAL_COMMUNITY)
Admission: RE | Disposition: A | Discharge status: Home / Self Care | Payer: Self-pay | Source: Ambulatory Visit | Attending: Otolaryngology

## 2016-09-14 ENCOUNTER — Inpatient Hospital Stay (HOSPITAL_COMMUNITY): Payer: Medicare Other | Admitting: Emergency Medicine

## 2016-09-14 DIAGNOSIS — K219 Gastro-esophageal reflux disease without esophagitis: Secondary | ICD-10-CM | POA: Diagnosis not present

## 2016-09-14 DIAGNOSIS — D509 Iron deficiency anemia, unspecified: Secondary | ICD-10-CM | POA: Diagnosis not present

## 2016-09-14 DIAGNOSIS — Z7901 Long term (current) use of anticoagulants: Secondary | ICD-10-CM

## 2016-09-14 DIAGNOSIS — J449 Chronic obstructive pulmonary disease, unspecified: Secondary | ICD-10-CM | POA: Diagnosis not present

## 2016-09-14 DIAGNOSIS — Z87891 Personal history of nicotine dependence: Secondary | ICD-10-CM

## 2016-09-14 DIAGNOSIS — I2510 Atherosclerotic heart disease of native coronary artery without angina pectoris: Secondary | ICD-10-CM | POA: Diagnosis not present

## 2016-09-14 DIAGNOSIS — I252 Old myocardial infarction: Secondary | ICD-10-CM | POA: Diagnosis not present

## 2016-09-14 DIAGNOSIS — R490 Dysphonia: Secondary | ICD-10-CM | POA: Diagnosis not present

## 2016-09-14 DIAGNOSIS — C73 Malignant neoplasm of thyroid gland: Principal | ICD-10-CM | POA: Diagnosis present

## 2016-09-14 DIAGNOSIS — Z951 Presence of aortocoronary bypass graft: Secondary | ICD-10-CM | POA: Diagnosis not present

## 2016-09-14 DIAGNOSIS — Z7951 Long term (current) use of inhaled steroids: Secondary | ICD-10-CM

## 2016-09-14 DIAGNOSIS — C770 Secondary and unspecified malignant neoplasm of lymph nodes of head, face and neck: Secondary | ICD-10-CM | POA: Diagnosis not present

## 2016-09-14 DIAGNOSIS — I739 Peripheral vascular disease, unspecified: Secondary | ICD-10-CM | POA: Diagnosis not present

## 2016-09-14 HISTORY — PX: THYROIDECTOMY: SHX17

## 2016-09-14 HISTORY — PX: LARYNGOPLASTY: SHX282

## 2016-09-14 LAB — PROTIME-INR
INR: 1.11
PROTHROMBIN TIME: 14.4 s (ref 11.4–15.2)

## 2016-09-14 SURGERY — THYROIDECTOMY
Anesthesia: General | Site: Throat

## 2016-09-14 MED ORDER — FENTANYL CITRATE (PF) 100 MCG/2ML IJ SOLN
25.0000 ug | INTRAMUSCULAR | Status: DC | PRN
  Administered 2016-09-14: 50 ug via INTRAVENOUS

## 2016-09-14 MED ORDER — FENTANYL CITRATE (PF) 100 MCG/2ML IJ SOLN: 25.0000 ug | INTRAMUSCULAR | Status: DC | PRN

## 2016-09-14 MED ORDER — CEFAZOLIN SODIUM-DEXTROSE 2-4 GM/100ML-% IV SOLN
2.0000 g | INTRAVENOUS | Status: AC
  Administered 2016-09-14: 2 g via INTRAVENOUS
  Filled 2016-09-14: qty 100

## 2016-09-14 MED ORDER — OMEGA-3 FATTY ACIDS 1000 MG PO CAPS
1.0000 g | ORAL_CAPSULE | Freq: Two times a day (BID) | ORAL | Status: DC
  Filled 2016-09-14: qty 1

## 2016-09-14 MED ORDER — PHENYLEPHRINE HCL 10 MG/ML IJ SOLN
INTRAVENOUS | Status: DC | PRN
  Administered 2016-09-14: 25 ug/min via INTRAVENOUS

## 2016-09-14 MED ORDER — ONDANSETRON HCL 4 MG/2ML IJ SOLN
INTRAMUSCULAR | Status: DC | PRN
  Administered 2016-09-14: 4 mg via INTRAVENOUS

## 2016-09-14 MED ORDER — DOCUSATE SODIUM 100 MG PO CAPS
200.0000 mg | ORAL_CAPSULE | Freq: Every day | ORAL | Status: DC
  Administered 2016-09-14: 200 mg via ORAL
  Filled 2016-09-14 (×2): qty 2

## 2016-09-14 MED ORDER — PROPOFOL 10 MG/ML IV BOLUS
INTRAVENOUS | Status: AC
  Filled 2016-09-14: qty 20

## 2016-09-14 MED ORDER — ONDANSETRON HCL 4 MG/2ML IJ SOLN
4.0000 mg | INTRAMUSCULAR | Status: DC | PRN
  Administered 2016-09-15: 4 mg via INTRAVENOUS
  Filled 2016-09-14: qty 2

## 2016-09-14 MED ORDER — FUROSEMIDE 20 MG PO TABS
20.0000 mg | ORAL_TABLET | Freq: Every day | ORAL | Status: DC
  Administered 2016-09-15: 20 mg via ORAL
  Filled 2016-09-14: qty 1

## 2016-09-14 MED ORDER — ONDANSETRON HCL 4 MG PO TABS
4.0000 mg | ORAL_TABLET | ORAL | Status: DC | PRN
  Administered 2016-09-14 – 2016-09-15 (×3): 4 mg via ORAL
  Filled 2016-09-14 (×3): qty 1

## 2016-09-14 MED ORDER — HYDROCODONE-ACETAMINOPHEN 5-325 MG PO TABS
1.0000 | ORAL_TABLET | ORAL | Status: DC | PRN
  Administered 2016-09-14 – 2016-09-15 (×4): 2 via ORAL
  Filled 2016-09-14 (×5): qty 2

## 2016-09-14 MED ORDER — FERROUS SULFATE 325 (65 FE) MG PO TABS
325.0000 mg | ORAL_TABLET | Freq: Two times a day (BID) | ORAL | Status: DC
  Administered 2016-09-14 – 2016-09-16 (×4): 325 mg via ORAL
  Filled 2016-09-14 (×4): qty 1

## 2016-09-14 MED ORDER — FENTANYL CITRATE (PF) 100 MCG/2ML IJ SOLN
INTRAMUSCULAR | Status: DC | PRN
  Administered 2016-09-14: 50 ug via INTRAVENOUS
  Administered 2016-09-14: 25 ug via INTRAVENOUS
  Administered 2016-09-14: 100 ug via INTRAVENOUS
  Administered 2016-09-14: 25 ug via INTRAVENOUS

## 2016-09-14 MED ORDER — NITROGLYCERIN 0.4 MG SL SUBL: 0.4000 mg | SUBLINGUAL_TABLET | SUBLINGUAL | Status: DC | PRN

## 2016-09-14 MED ORDER — POTASSIUM CHLORIDE CRYS ER 20 MEQ PO TBCR
20.0000 meq | EXTENDED_RELEASE_TABLET | Freq: Every day | ORAL | Status: DC
  Administered 2016-09-15: 20 meq via ORAL
  Filled 2016-09-14: qty 1

## 2016-09-14 MED ORDER — ZOLPIDEM TARTRATE 5 MG PO TABS: 5.0000 mg | ORAL_TABLET | Freq: Every evening | ORAL | Status: DC | PRN

## 2016-09-14 MED ORDER — ALBUTEROL SULFATE HFA 108 (90 BASE) MCG/ACT IN AERS
INHALATION_SPRAY | RESPIRATORY_TRACT | Status: DC | PRN
  Administered 2016-09-14 (×2): 2 via RESPIRATORY_TRACT

## 2016-09-14 MED ORDER — 0.9 % SODIUM CHLORIDE (POUR BTL) OPTIME
TOPICAL | Status: DC | PRN
  Administered 2016-09-14: 1000 mL

## 2016-09-14 MED ORDER — CEFAZOLIN IN D5W 1 GM/50ML IV SOLN
1.0000 g | Freq: Three times a day (TID) | INTRAVENOUS | Status: DC
  Administered 2016-09-14 – 2016-09-16 (×6): 1 g via INTRAVENOUS
  Filled 2016-09-14 (×7): qty 50

## 2016-09-14 MED ORDER — LIDOCAINE-EPINEPHRINE 1 %-1:100000 IJ SOLN
INTRAMUSCULAR | Status: AC
  Filled 2016-09-14: qty 1

## 2016-09-14 MED ORDER — LIDOCAINE-EPINEPHRINE 1 %-1:100000 IJ SOLN
INTRAMUSCULAR | Status: DC | PRN
  Administered 2016-09-14: 6 mL

## 2016-09-14 MED ORDER — BACITRACIN ZINC 500 UNIT/GM EX OINT
TOPICAL_OINTMENT | CUTANEOUS | Status: AC
  Filled 2016-09-14: qty 28.35

## 2016-09-14 MED ORDER — PHENOL 1.4 % MT LIQD: 1.0000 | OROMUCOSAL | Status: DC | PRN

## 2016-09-14 MED ORDER — OXYCODONE HCL 5 MG/5ML PO SOLN: 5.0000 mg | Freq: Once | ORAL | Status: DC | PRN

## 2016-09-14 MED ORDER — ALBUTEROL SULFATE (2.5 MG/3ML) 0.083% IN NEBU
3.0000 mL | INHALATION_SOLUTION | Freq: Four times a day (QID) | RESPIRATORY_TRACT | Status: DC | PRN
  Administered 2016-09-14 – 2016-09-16 (×4): 3 mL via RESPIRATORY_TRACT
  Filled 2016-09-14 (×4): qty 3

## 2016-09-14 MED ORDER — BACITRACIN ZINC 500 UNIT/GM EX OINT
1.0000 "application " | TOPICAL_OINTMENT | Freq: Three times a day (TID) | CUTANEOUS | Status: DC
  Administered 2016-09-14 – 2016-09-16 (×5): 1 via TOPICAL
  Filled 2016-09-14: qty 28.35

## 2016-09-14 MED ORDER — OXYCODONE HCL 5 MG PO TABS: 5.0000 mg | ORAL_TABLET | Freq: Once | ORAL | Status: DC | PRN

## 2016-09-14 MED ORDER — CALCIUM CARBONATE-VITAMIN D 500-200 MG-UNIT PO TABS
2.0000 | ORAL_TABLET | Freq: Two times a day (BID) | ORAL | Status: DC
  Administered 2016-09-14 – 2016-09-15 (×4): 2 via ORAL
  Filled 2016-09-14 (×4): qty 2

## 2016-09-14 MED ORDER — LACTATED RINGERS IV SOLN
INTRAVENOUS | Status: DC | PRN
  Administered 2016-09-14 (×2): via INTRAVENOUS

## 2016-09-14 MED ORDER — BACITRACIN ZINC 500 UNIT/GM EX OINT
TOPICAL_OINTMENT | CUTANEOUS | Status: DC | PRN
  Administered 2016-09-14: 1 via TOPICAL

## 2016-09-14 MED ORDER — LIDOCAINE HCL (CARDIAC) 20 MG/ML IV SOLN
INTRAVENOUS | Status: DC | PRN
  Administered 2016-09-14: 50 mg via INTRAVENOUS

## 2016-09-14 MED ORDER — CHLORHEXIDINE GLUCONATE CLOTH 2 % EX PADS: 6.0000 | MEDICATED_PAD | Freq: Once | CUTANEOUS | Status: DC

## 2016-09-14 MED ORDER — ONDANSETRON HCL 4 MG/2ML IJ SOLN: 4.0000 mg | Freq: Once | INTRAMUSCULAR | Status: DC | PRN

## 2016-09-14 MED ORDER — METOPROLOL SUCCINATE ER 50 MG PO TB24
50.0000 mg | ORAL_TABLET | Freq: Two times a day (BID) | ORAL | Status: DC
  Administered 2016-09-14 – 2016-09-15 (×3): 50 mg via ORAL
  Filled 2016-09-14 (×3): qty 1

## 2016-09-14 MED ORDER — FOLIC ACID 1 MG PO TABS
1.0000 mg | ORAL_TABLET | Freq: Every day | ORAL | Status: DC
  Administered 2016-09-15: 1 mg via ORAL
  Filled 2016-09-14: qty 1

## 2016-09-14 MED ORDER — EPINEPHRINE HCL (NASAL) 0.1 % NA SOLN
NASAL | Status: AC
  Filled 2016-09-14: qty 30

## 2016-09-14 MED ORDER — ROCURONIUM BROMIDE 100 MG/10ML IV SOLN
INTRAVENOUS | Status: DC | PRN
  Administered 2016-09-14: 20 mg via INTRAVENOUS
  Administered 2016-09-14: 50 mg via INTRAVENOUS

## 2016-09-14 MED ORDER — SUGAMMADEX SODIUM 200 MG/2ML IV SOLN
INTRAVENOUS | Status: DC | PRN
  Administered 2016-09-14: 200 mg via INTRAVENOUS

## 2016-09-14 MED ORDER — ACETAMINOPHEN 160 MG/5ML PO SOLN: 650.0000 mg | ORAL | Status: DC | PRN

## 2016-09-14 MED ORDER — DILTIAZEM HCL ER COATED BEADS 240 MG PO CP24
240.0000 mg | ORAL_CAPSULE | Freq: Every day | ORAL | Status: DC
  Administered 2016-09-15: 240 mg via ORAL
  Filled 2016-09-14: qty 1

## 2016-09-14 MED ORDER — LISINOPRIL 10 MG PO TABS
10.0000 mg | ORAL_TABLET | Freq: Every day | ORAL | Status: DC
  Administered 2016-09-14 – 2016-09-15 (×2): 10 mg via ORAL
  Filled 2016-09-14 (×2): qty 1

## 2016-09-14 MED ORDER — FENTANYL CITRATE (PF) 100 MCG/2ML IJ SOLN
INTRAMUSCULAR | Status: AC
  Filled 2016-09-14: qty 2

## 2016-09-14 MED ORDER — PROPOFOL 10 MG/ML IV BOLUS
INTRAVENOUS | Status: DC | PRN
  Administered 2016-09-14: 140 mg via INTRAVENOUS

## 2016-09-14 MED ORDER — ACETAMINOPHEN 650 MG RE SUPP: 650.0000 mg | RECTAL | Status: DC | PRN

## 2016-09-14 MED ORDER — KCL IN DEXTROSE-NACL 20-5-0.45 MEQ/L-%-% IV SOLN
INTRAVENOUS | Status: DC
  Administered 2016-09-14 – 2016-09-15 (×2): via INTRAVENOUS
  Filled 2016-09-14 (×4): qty 1000

## 2016-09-14 MED ORDER — FENTANYL CITRATE (PF) 100 MCG/2ML IJ SOLN
INTRAMUSCULAR | Status: AC
  Filled 2016-09-14: qty 4

## 2016-09-14 MED ORDER — EPINEPHRINE PF 1 MG/ML IJ SOLN
INTRAMUSCULAR | Status: DC | PRN
  Administered 2016-09-14: 1 mg

## 2016-09-14 SURGICAL SUPPLY — 53 items
ATTRACTOMAT 16X20 MAGNETIC DRP (DRAPES) ×4 IMPLANT
BLADE 10 SAFETY STRL DISP (BLADE) ×4 IMPLANT
BUR ROUND FLUTED 4 SOFT TCH (BURR) ×3 IMPLANT
BUR ROUND FLUTED 4MM SOFT TCH (BURR) ×1
CANISTER SUCTION 2500CC (MISCELLANEOUS) ×4 IMPLANT
CLEANER TIP ELECTROSURG 2X2 (MISCELLANEOUS) ×8 IMPLANT
CONT SPEC 4OZ CLIKSEAL STRL BL (MISCELLANEOUS) ×12 IMPLANT
CORDS BIPOLAR (ELECTRODE) ×4 IMPLANT
COVER SURGICAL LIGHT HANDLE (MISCELLANEOUS) ×4 IMPLANT
DRAIN JACKSON RD 7FR 3/32 (WOUND CARE) ×4 IMPLANT
DRAIN SNY 7 FPER (WOUND CARE) IMPLANT
ELECT COATED BLADE 2.86 ST (ELECTRODE) ×8 IMPLANT
ELECT REM PT RETURN 9FT ADLT (ELECTROSURGICAL) ×4
ELECTRODE REM PT RTRN 9FT ADLT (ELECTROSURGICAL) ×2 IMPLANT
EVACUATOR SILICONE 100CC (DRAIN) ×4 IMPLANT
GAUZE SPONGE 4X4 12PLY STRL (GAUZE/BANDAGES/DRESSINGS) ×4 IMPLANT
GAUZE SPONGE 4X4 16PLY XRAY LF (GAUZE/BANDAGES/DRESSINGS) ×8 IMPLANT
GLOVE BIOGEL PI IND STRL 6.5 (GLOVE) ×2 IMPLANT
GLOVE BIOGEL PI IND STRL 7.0 (GLOVE) ×2 IMPLANT
GLOVE BIOGEL PI INDICATOR 6.5 (GLOVE) ×2
GLOVE BIOGEL PI INDICATOR 7.0 (GLOVE) ×2
GLOVE SS BIOGEL STRL SZ 7.5 (GLOVE) ×4 IMPLANT
GLOVE SUPERSENSE BIOGEL SZ 7.5 (GLOVE) ×4
GLOVE SURG SS PI 6.0 STRL IVOR (GLOVE) ×8 IMPLANT
GLOVE SURG SS PI 7.5 STRL IVOR (GLOVE) ×4 IMPLANT
GOWN STRL REUS W/ TWL LRG LVL3 (GOWN DISPOSABLE) ×6 IMPLANT
GOWN STRL REUS W/ TWL XL LVL3 (GOWN DISPOSABLE) ×4 IMPLANT
GOWN STRL REUS W/TWL LRG LVL3 (GOWN DISPOSABLE) ×6
GOWN STRL REUS W/TWL XL LVL3 (GOWN DISPOSABLE) ×4
KIT BASIN OR (CUSTOM PROCEDURE TRAY) ×4 IMPLANT
KIT ROOM TURNOVER OR (KITS) ×4 IMPLANT
LOCATOR NERVE 3 VOLT (DISPOSABLE) IMPLANT
NEEDLE HYPO 25GX1X1/2 BEV (NEEDLE) ×4 IMPLANT
NS IRRIG 1000ML POUR BTL (IV SOLUTION) ×4 IMPLANT
PAD ARMBOARD 7.5X6 YLW CONV (MISCELLANEOUS) ×8 IMPLANT
PENCIL BUTTON HOLSTER BLD 10FT (ELECTRODE) ×4 IMPLANT
SHEARS HARMONIC 9CM CVD (BLADE) ×4 IMPLANT
SPECIMEN JAR MEDIUM (MISCELLANEOUS) ×4 IMPLANT
SPONGE INTESTINAL PEANUT (DISPOSABLE) ×4 IMPLANT
STAPLER VISISTAT 35W (STAPLE) ×4 IMPLANT
SUT CHROMIC 3 0 PS 2 (SUTURE) ×4 IMPLANT
SUT ETHILON 2 0 FS 18 (SUTURE) ×4 IMPLANT
SUT ETHILON 5 0 P 3 18 (SUTURE) ×2
SUT NYLON ETHILON 5-0 P-3 1X18 (SUTURE) ×2 IMPLANT
SUT SILK 2 0 (SUTURE) ×2
SUT SILK 2 0 SH CR/8 (SUTURE) IMPLANT
SUT SILK 2-0 18XBRD TIE 12 (SUTURE) ×2 IMPLANT
SUT SILK 3 0 (SUTURE) ×2
SUT SILK 3-0 18XBRD TIE 12 (SUTURE) ×2 IMPLANT
SUT SILK 4 0 (SUTURE)
SUT SILK 4-0 18XBRD TIE 12 (SUTURE) IMPLANT
TAPE CLOTH SURG 4X10 WHT LF (GAUZE/BANDAGES/DRESSINGS) ×4 IMPLANT
TRAY ENT MC OR (CUSTOM PROCEDURE TRAY) ×4 IMPLANT

## 2016-09-14 NOTE — Transfer of Care (Signed)
Immediate Anesthesia Transfer of Care Note  Patient: John Sampson  Procedure(s) Performed: Procedure(s): TOTAL THYROIDECTOMY (N/A) LARYNGOPLASTY (Left)  Patient Location: PACU  Anesthesia Type:General  Level of Consciousness: awake, alert  and oriented  Airway & Oxygen Therapy: Patient Spontanous Breathing and Patient connected to nasal cannula oxygen  Post-op Assessment: Report given to RN and Post -op Vital signs reviewed and stable  Post vital signs: Reviewed and stable  Last Vitals:  Vitals:   09/14/16 0621 09/14/16 1153  BP: (!) 193/56 (!) 153/66  Pulse: (!) 59   Resp: 20 (!) 6  Temp: 36.7 C 36.4 C    Last Pain:  Vitals:   09/14/16 1153  TempSrc:   PainSc: Asleep      Patients Stated Pain Goal: 3 ([id number])  Complications: No apparent anesthesia complications

## 2016-09-14 NOTE — Brief Op Note (Signed)
09/14/2016  11:41 AM  PATIENT:  John Sampson  80 y.o. male  PRE-OPERATIVE DIAGNOSIS:  thyroid cancer  POST-OPERATIVE DIAGNOSIS:  thyroid cancer  PROCEDURE:  Procedure(s): TOTAL THYROIDECTOMY (N/A) LARYNGOPLASTY (Left)  SURGEON:  Surgeon(s) and Role:    * [name], MD - Primary    * Thornell Sartorius, MD - Assisting    * Alphonsa Overall, MD - Assisting  PHYSICIAN ASSISTANT:   ASSISTANTS: Dr Ernesto Rutherford and Dr Nydia Bouton   ANESTHESIA:   general  EBL:  Total I/O In: 800 [I.V.:800] Out: 70 [Blood:50]  BLOOD ADMINISTERED:none  DRAINS: (12) Jackson-Pratt drain(s) with closed bulb suction in the neck   LOCAL MEDICATIONS USED:  XYLOCAINE with EPI 4 cc  SPECIMEN:  Source of Specimen:  left and right thyroid lobes, left neck node  DISPOSITION OF SPECIMEN:  PATHOLOGY  COUNTS:  YES  TOURNIQUET:  * No tourniquets in log *  DICTATION: .Other Dictation: Dictation Number [id number]  PLAN OF CARE: Admit to inpatient   PATIENT DISPOSITION:  PACU - hemodynamically stable.   Delay start of Pharmacological VTE agent (>24hrs) due to surgical blood loss or risk of bleeding: yes

## 2016-09-14 NOTE — Anesthesia Postprocedure Evaluation (Signed)
Anesthesia Post Note  Patient: John Sampson  Procedure(s) Performed: Procedure(s) (LRB): TOTAL THYROIDECTOMY (N/A) LARYNGOPLASTY (Left)  Patient location during evaluation: PACU Anesthesia Type: General Level of consciousness: awake, awake and alert and oriented Pain management: pain level controlled Vital Signs Assessment: post-procedure vital signs reviewed and stable Respiratory status: spontaneous breathing, nonlabored ventilation and respiratory function stable Cardiovascular status: blood pressure returned to baseline Anesthetic complications: no    Last Vitals:  Vitals:   09/14/16 1303 09/14/16 1332  BP: (!) 157/56 (!) 152/56  Pulse: (!) 54 (!) 52  Resp: 17 13  Temp: 36.4 C 36.8 C    Last Pain:  Vitals:   09/14/16 1439  TempSrc:   PainSc: Asleep                 [name] COKER

## 2016-09-14 NOTE — Progress Notes (Signed)
Post op check AF VSS JP output   30 cc last 2 hrs  serosanginous Neck soft without swelling Voice doing better without airway problems Stable post op course   Will check Ca in am

## 2016-09-14 NOTE — Interval H&P Note (Signed)
History and Physical Interval Note:  09/14/2016 7:25 AM  John Sampson  has presented today for surgery, with the diagnosis of thyroid cancer  The various methods of treatment have been discussed with the patient and family. After consideration of risks, benefits and other options for treatment, the patient has consented to  Procedure(s): TOTAL THYROIDECTOMY (N/A) LARYNGOPLASTY (Left) as a surgical intervention .  The patient's history has been reviewed, patient examined, no change in status, stable for surgery.  I have reviewed the patient's chart and labs.  Questions were answered to the patient's satisfaction.     CHRISTOPHER NEWMAN

## 2016-09-14 NOTE — Anesthesia Preprocedure Evaluation (Addendum)
Anesthesia Evaluation  Patient identified by MRN, date of birth, ID band Patient awake    Reviewed: Allergy & Precautions, NPO status , Patient's Chart, lab work & pertinent test results  Airway Mallampati: II  TM Distance: >3 FB Neck ROM: Full    Dental  (+) Edentulous Lower, Edentulous Upper   Pulmonary former smoker,    breath sounds clear to auscultation       Cardiovascular  Rhythm:Regular Rate:Normal     Neuro/Psych    GI/Hepatic   Endo/Other    Renal/GU      Musculoskeletal   Abdominal   Peds  Hematology   Anesthesia Other Findings   Reproductive/Obstetrics                           Anesthesia Physical Anesthesia Plan  ASA: III  Anesthesia Plan: General   Post-op Pain Management:    Induction:   Airway Management Planned: Oral ETT  Additional Equipment:   Intra-op Plan:   Post-operative Plan: Possible Post-op intubation/ventilation  Informed Consent: I have reviewed the patients History and Physical, chart, labs and discussed the procedure including the risks, benefits and alternatives for the proposed anesthesia with the patient or authorized representative who has indicated his/her understanding and acceptance.     Plan Discussed with: CRNA and Anesthesiologist  Anesthesia Plan Comments:        Anesthesia Quick Evaluation

## 2016-09-14 NOTE — Anesthesia Procedure Notes (Signed)
Procedure Name: Intubation Date/Time: 09/14/2016 7:42 AM Performed by: Clearnce Sorrel Pre-anesthesia Checklist: Patient identified, Emergency Drugs available, Suction available, Patient being monitored and Timeout performed Patient Re-evaluated:Patient Re-evaluated prior to inductionOxygen Delivery Method: Circle system utilized Preoxygenation: Pre-oxygenation with 100% oxygen Intubation Type: IV induction Ventilation: Mask ventilation without difficulty and Oral airway inserted - appropriate to patient size Laryngoscope Size: Mac and 4 Grade View: Grade I Tube type: Oral Number of attempts: 1 Airway Equipment and Method: Stylet Placement Confirmation: ETT inserted through vocal cords under direct vision,  positive ETCO2 and breath sounds checked- equal and bilateral Secured at: 23 cm Tube secured with: Tape Dental Injury: Teeth and Oropharynx as per pre-operative assessment

## 2016-09-15 ENCOUNTER — Encounter (HOSPITAL_COMMUNITY): Payer: Self-pay | Admitting: Otolaryngology

## 2016-09-15 LAB — CALCIUM: CALCIUM: 9.2 mg/dL (ref 8.9–10.3)

## 2016-09-15 MED ORDER — OMEGA-3-ACID ETHYL ESTERS 1 G PO CAPS
1.0000 g | ORAL_CAPSULE | Freq: Two times a day (BID) | ORAL | Status: DC
  Administered 2016-09-15 (×2): 1 g via ORAL
  Filled 2016-09-15 (×2): qty 1

## 2016-09-15 NOTE — Op Note (Signed)
NAMEMarland Sampson  John, Sampson NO.:  [id number]  MEDICAL RECORD NO.:  [id number]  LOCATION:  [id number]                        FACILITY:  [facility]  PHYSICIAN:  Leonides Sake. Lucia Gaskins, M.D.DATE OF BIRTH:  05-31-1936  DATE OF PROCEDURE:  09/14/2016 DATE OF DISCHARGE:                              OPERATIVE REPORT   PREOPERATIVE DIAGNOSIS:  Left-sided thyroid cancer.  POSTOPERATIVE DIAGNOSIS:  Left-sided thyroid cancer.  Frozen section consistent with papillary carcinoma.  OPERATION PERFORMED:  Total thyroidectomy with left vocal cord laryngoplasty.  SURGEON:  Leonides Sake. Lucia Gaskins, M.D.  ASSISTANT SURGEON:  Minna Merritts, M.D. and Fenton Malling. Lucia Gaskins, M.D.  ANESTHESIA:  General endotracheal.  ESTIMATED BLOOD LOSS:  Less than 100 mL.  COMPLICATIONS:  None.  BRIEF CLINICAL NOTE:  John Sampson is a 80 year old gentleman who has had about a 54-month history of hoarseness and on exam in the office, has a left vocal cord paralysis.  Subsequent CT scan of the neck showed enlarged partially-calcified left thyroid mass consistent with malignancy.  He was taken to the operating room at this time for total thyroidectomy and left vocal cord laryngoplasty help to improve his voice as he has had a hoarse voice and weakness of his voice, although, he has had really no significant aspiration.  DESCRIPTION OF PROCEDURE:  After adequate endotracheal anesthesia, about a 10 or 12-mm esophageal dilator was passed down without any difficulty into the upper esophagus and was identified this during the procedure as on CT scan of the lesion, not only was attached to the trachea, but also attached partly to the esophageal musculature posteriorly.  The neck was subsequently prepped with Betadine solution and draped out with sterile towels.  The patient received 2 g of Ancef IV preoperatively.  The patient's neck was extended.  A standard thyroid incision was made just above the clavicular heads.  The  strap muscles on the left side were actually partially attached and invaded by the large left thyroid tumor, the mass was very firm and hard and measured probably 4-5 cm in size. Extended all the way up above the cricoid cartilage to the inferior edge of the laryngeal cartilage.  First, frozen section was obtained from the body of the cancer and sent to Pathology.  Subsequent report of this revealed findings consistent with a papillary carcinoma.  At this point, dissection was carried around laterally, superiorly and inferiorly around the large left thyroid mass.  It was actually adherent to the trachea and it was carefully dissected off the trachea leaving a little bit rim around the trachea, but not damaging or opening up the lumen of the trachea on the left side.  Superiorly, the superior thyroid vessels were ligated and divided as was the large superior vein and went to the jugular vein.  These were all ligated with 2-0 silk sutures. Inferiorly, the inferior extent dissection was extended down behind the clavicular heads and this was taken out piecemeal with clamping, ligating and dividing the tissue inferiorly.  The patient already had a left vocal cord paralysis and no attention was paid to the recurrent nerve on the left side.  However, the parathyroid was questionably identified inferiorly on the left  side and then portion of this was preserved and left with the patient.  Laterally, the internal jugular vein was separated from the lesion and this was dissected out with cautery and hemostats.  Portion of the strap muscles were resected, overlie the mass, but the mass was definitely attached to the trachea and this was carefully dissected off the trachea leaving it intact and a second frozen was sent and again revealed just papillary carcinoma.  The remaining lobe was removed and sent as left lobe specimen.  Following completion of the left thyroid lobectomy including the cancer,  right thyroid was removed.  The right thyroid was fairly standard.  There was one small nodule in the right thyroid lobe that was removed.  The inferior and superior parathyroid glands were identified and preserved. Care was taken to preserve the recurrent nerve and small amount of additional thyroid tissue was left overlying the recurrent nerve in the tracheoesophageal groove.  Superior vessels were ligated with 2-0 and 3- 0 silk sutures, and the inferior vessels were ligated with 3-0 silk sutures.  Right lobe was sent as a separate specimen.  On palpation, for any reporting lymph nodes, there was one small lymph node on the left side that was sent as a separate specimen as a left parajugular lymph node.  This completed the total thyroidectomy.  Following the total thyroidectomy, the wound was irrigated with saline.  Prior to closure, a laryngoplasty was performed on the left side.  Using the drill as the laryngeal cartilage was calcified, a small window was made in the left side inferiorly about the level of the cords.  On the left side, the laryngeal cartilage window measured probably 5-6 mm x 12-14 mm in length.  The silastics were shaped to appropriate size that medialized the vocal cord on the left side probably 2-3 mm anteriorly and 4-5 mm posteriorly.  This was secured in the small window and completed the medialization laryngoplasty.  Prior to placing silastic, perichondrium was elevated anteriorly, inferiorly and posteriorly off the inner side of the laryngeal cartilage and adrenaline was placed for hemostasis. Following placement of the silastic, the fiberoptic laryngoscope was used to visualize the cord, which appeared to be better medialized. This completed the procedure.  Wound was irrigated with saline and the defect was closed with 3-0 chromic sutures subcutaneously, reapproximated the remaining inferior strap muscle.  Subcutaneous stitches of 3-0 chromic were placed and  then the skin was reapproximated with a 5-0 nylon suture followed by bacitracin ointment and dressing.  A JP drain was brought out through a separate stab incision inferiorly on the left side.  The patient was subsequently awoken from anesthesia and transferred to the recovery room, postop doing well.          ______________________________ Leonides Sake. Lucia Gaskins, M.D.     CEN/MEDQ  D:  09/14/2016  T:  09/15/2016  Job:  [id number]  cc:   Percell Miller L. Luan Pulling, M.D. Minna Merritts, M.D. Fenton Malling. Lucia Gaskins, M.D.

## 2016-09-15 NOTE — Progress Notes (Signed)
POD 1 AF VSS JP output  140 cc yesterday, 20 cc so far todday Neck flat without swelling No airway problems, having a little difficulty swallowing but taking regular diet Voice doing well Ca 9.2 Stable post op course Plan to remove JP and discharge in am

## 2016-09-16 LAB — CALCIUM: Calcium: 9.2 mg/dL (ref 8.9–10.3)

## 2016-09-16 NOTE — Progress Notes (Signed)
POD 2  Discharge AF VSS JP 10 cc last shift.  Was removed Neck wound doing well without swelling Complains of some difficulty swallowing but no significant pain Ca 9.2 stable Discharge home today on his regular meds.  No new meds given Follow up my office in 4 days

## 2016-09-16 NOTE — Discharge Instructions (Signed)
Take your regular medications and restart your blood thinner Eliquis tomorrow. OK to get your incision site wet with a shower Apply antibiotic ointment daily to the incision Return to see Dr Lucia Gaskins  Next Monday at 4:30 at his office

## 2016-09-16 NOTE — Progress Notes (Signed)
Patient discharged to home with instructions. 

## 2016-09-17 NOTE — Discharge Summary (Signed)
NAMEMarland Kitchen  [name], [name] NO.:  [id number]  MEDICAL RECORD NO.:  [id number]  LOCATION:  [id number]                        FACILITY:  [facility]  PHYSICIAN:  Leonides Sake. Lucia Gaskins, M.D.DATE OF BIRTH:  [birth year]  DATE OF ADMISSION:  09/14/2016 DATE OF DISCHARGE:  09/16/2016                              DISCHARGE SUMMARY   DIAGNOSIS:  Left lobe thyroid cancer (frozen section consistent with papillary carcinoma).  OTHER DIAGNOSES:  Essential hypertension; coronary artery disease, status post coronary artery bypass graft; chronic obstructive pulmonary disease; chronic venous insufficiency.  OPERATION DURING THIS HOSPITALIZATION:  Total thyroidectomy with left vocal cord medialization laryngoplasty, date of operation, September 14, 2016.  HOSPITAL COURSE:  John Sampson is a 80 year old gentleman who had a left thyroid mass with preoperative paralysis of the left true vocal cord and on CT findings, this is consistent with probable malignant thyroid mass along with vocal cord paralysis.  He was taken to the hospital at this time for total thyroidectomy and left vocal cord laryngoplasty.  The patient was admitted via the operating room, which time, he underwent a total thyroidectomy and left vocal cord laryngoplasty with no significant complications.  Of note, the tumor was adjacent and adherent to the trachea.  The tumor was peeled off the trachea and the trachea remained intact throughout the procedure.  Postoperatively, the patient was admitted for observation, had a JP drain in for 2 days.  His calciums were monitored and were stable at 9.2 on the first and second postoperative day.  His neck wound was doing well with no significant swelling, no airway problems.  He did have some complaints of little bit of difficulty swallowing, most likely as a result of the proximity of the tumor to the esophagus.  He is able to take adequate p.o., ultimately did have some little bit of  coughing associated with the p.o. He remained afebrile throughout his hospitalization and received perioperative Ancef during his hospitalization.  Following removal of the JP drain, the patient was subsequently discharged to home on his second postoperative day.  He is doing well with minimal pain.  DISCHARGE INSTRUCTIONS:  The patient was discharged to home.  He was instructed to apply bacitracin ointment to his incision site daily.  It is okay to take a shower and get incisions site wet.  He will continue with his regular medications and we will restart his Eliquis that had been discontinued for 2 days preoperatively.  We will have him follow up in my office in 4 days for recheck, to have sutures removed and review of his final pathology report.  He will subsequently follow up with Dr. Criss Rosales of Endocrinology for further treatment of his papillary thyroid carcinoma in 2-3 weeks.          ______________________________ Leonides Sake Lucia Gaskins, M.D.    CEN/MEDQ  D:  09/16/2016  T:  09/17/2016  Job:  [id number]

## 2016-09-23 NOTE — Progress Notes (Signed)
Cardiology Office Note  Date: 09/24/2016   ID: John Sampson, DOB [birth year], MRN [id number]  PCP: John Bogus, MD  Primary Cardiologist: John Lesches, MD   Chief Complaint  Patient presents with  . Coronary Artery Disease  . History of atrial flutter    History of Present Illness: John Sampson is a 80 y.o. male last seen in June. Interval history reviewed, he was diagnosed with thyroid cancer (papillary carcinoma) and underwent total thyroidectomy with John Sampson recently in October. States he still feels very weak, has not gotten back to baseline, but he has had no specific cardiac complaints of angina or palpitations. I reviewed his recent ECG which showed sinus rhythm. He was temporarily off Eliquis for surgery, now back on his routine medications.  Echocardiogram from February of this year is outlined below.  Past Medical History:  Diagnosis Date  . Atrial flutter ([place])   . COPD (chronic obstructive pulmonary disease) ([place]) 3.12.14   2D Echo, EF 50-55%  . Coronary atherosclerosis of native coronary artery    Multivessel status post CABG  . Gastric ulcer    Small - nonbleeding  . GERD (gastroesophageal reflux disease)   . Headache(784.0)   . Iron deficiency anemia    Negative Givens capsule study   . Myocardial infarction   . PAD (peripheral artery disease) (HCC)    Moderate bilateral SFA disease at angiography 01/2013  . 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 acetaminophen (TYLENOL) 325 MG tablet Take 650 mg by mouth daily as needed for headache.    . albuterol (PROVENTIL HFA) 108 (90 BASE) MCG/ACT inhaler Inhale 2 puffs into the lungs every 6 (six) hours as needed for wheezing or shortness of breath. 1 Inhaler 3  . apixaban (ELIQUIS) 5 MG TABS tablet Take 1 tablet (5 mg total) by mouth 2 (two) times daily. 60 tablet 3  . diltiazem (CARDIZEM CD) 240 MG 24 hr capsule Take 1 capsule (240 mg total)  by mouth daily. 90 capsule 3  . docusate sodium (COLACE) 100 MG capsule Take 200 mg by mouth at bedtime.    . ferrous sulfate 325 (65 FE) MG tablet Take 1 tablet (325 mg total) by mouth 2 (two) times daily with a meal.    . fish oil-omega-3 fatty acids 1000 MG capsule Take 1 g by mouth 2 (two) times daily.     . folic acid (FOLVITE) 1 MG tablet Take 1 mg by mouth daily.     . furosemide (LASIX) 20 MG tablet Take 1 tablet (20 mg total) by mouth daily. (Patient taking differently: Take 20 mg by mouth daily as needed for fluid. ) 90 tablet 3  . lisinopril (PRINIVIL,ZESTRIL) 10 MG tablet Take 1 tablet (10 mg total) by mouth daily. 90 tablet 3  . metoprolol succinate (TOPROL-XL) 50 MG 24 hr tablet Take 50 mg by mouth 2 (two) times daily. Take with or immediately following a meal.    . nitroGLYCERIN (NITROSTAT) 0.4 MG SL tablet Place 1 tablet (0.4 mg total) under the tongue every 5 (five) minutes as needed for chest pain. 30 tablet 9  . omeprazole (PRILOSEC) 20 MG capsule Take 20 mg by mouth 2 (two) times daily before a meal.    . potassium chloride SA (K-DUR,KLOR-CON) 20 MEQ tablet Take 20 mEq by mouth daily.    . simvastatin (ZOCOR) 40 MG tablet Take 0.5 tablets (20 mg total) by mouth at  bedtime. 30 tablet 12   No current facility-administered medications for this visit.    Allergies:  No known allergies   Social History: The patient  reports that he quit smoking about 14 years ago. His smoking use included Cigarettes. He started smoking about 61 years ago. He has a 135.00 pack-year smoking history. He has never used smokeless tobacco. He reports that he does not drink alcohol or use drugs.   ROS:  Please see the history of present illness. Otherwise, complete review of systems is positive for fatigue, appetite is fair.  All other systems are reviewed and negative.   Physical Exam: VS:  BP (!) 136/58   Pulse 66   Ht 5\' 9"  (1.753 m)   Wt 215 lb (97.5 kg)   SpO2 96%   BMI 31.75 kg/m , BMI Body  mass index is 31.75 kg/m.  Wt Readings from Last 3 Encounters:  09/24/16 215 lb (97.5 kg)  09/14/16 217 lb (98.4 kg)  09/10/16 217 lb (98.4 kg)    General: Overweight male, appears comfortable at rest. HEENT: Conjunctiva and lids normal, oropharynx clear. Neck: Supple, no elevated JVP or carotid bruits, no thyromegaly. Lungs: Clear to auscultation, nonlabored breathing at rest. Cardiac: Regular rate and rhythm with ectopy, no S3 or significant systolic murmur, no pericardial rub. Abdomen: Soft, nontender, protuberant, bowel sounds present, no guarding or rebound. Extremities: Mild leg edema , distal pulses 1-2, left greater than right +.  ECG: I personally reviewed the tracing from 08/23/2016 which showed sinus rhythm with anteroseptal and anterolateral ST/T-wave abnormalities.  Recent Labwork: 08/24/2016: ALT 16; AST 13 09/10/2016: BUN 14; Creatinine, Ser 1.12; Hemoglobin 12.1; Platelets 290; Potassium 4.3; Sodium 140     Component Value Date/Time   CHOL 184 08/14/2013 0818   TRIG 108 08/14/2013 0818   HDL 44 08/14/2013 0818   CHOLHDL 4.2 08/14/2013 0818   VLDL 22 08/14/2013 0818   LDLCALC 118 (H) 08/14/2013 0818    Other Studies Reviewed Today:  Echocardiogram 01/01/2016: Study Conclusions  - Left ventricle: The cavity size was normal. Wall thickness was  increased in a pattern of moderate LVH. The estimated ejection  fraction was 55%. Probable hypokinesis of the basalinferior  myocardium. Doppler parameters are consistent with restrictive  physiology, indicative of decreased left ventricular diastolic  compliance and/or increased left atrial pressure. - Aortic valve: Mildly calcified annulus. Trileaflet; mildly  calcified leaflets. - Mitral valve: Calcified annulus. There was mild regurgitation. - Left atrium: The atrium was severely dilated. - Right atrium: Central venous pressure (est): 3 mm Hg. - Tricuspid valve: There was mild regurgitation. - Pulmonary  arteries: Systolic pressure was moderately increased.  PA peak pressure: 53 mm Hg (S). - Pericardium, extracardiac: There was no pericardial effusion.  Impressions:  - Moderate LVH with LVEF approximately 55% and basal inferior  hypokinesis. Restrictive diastolic filling pattern is noted with  evidence of increased LV filling pressure. severe left atrial  enlargement. MAC with mild mitral regurgitation. Sclerotic aortic  valve without stenosis. Mild tricuspid regurgitation with  evidence of moderate pulmonary hypertension, PASP 53 mmHg.  Assessment and Plan:  1. Symptomatically stable CAD with history of CABG. Plan to continue medical therapy and observation.  2. History of typical atrial flutter status post cardioversion in July 2016 and without recurrence. He continues on Eliquis, Cardizem CD, and Toprol-XL.  3. Patient status post recent total thyroidectomy for management of papillary thyroid carcinoma. No perioperative complications.  Current medicines were reviewed with the patient today.  Disposition: Follow-up with me in 3 months.  Signed, Satira Sark, MD, [facility] 09/24/2016 2:22 PM    [facility] at [facility] [address] [place], [place] [address] Phone: [phone]; Fax: [phone]

## 2016-09-24 ENCOUNTER — Ambulatory Visit (INDEPENDENT_AMBULATORY_CARE_PROVIDER_SITE_OTHER): Payer: Medicare Other | Admitting: Cardiology

## 2016-09-24 ENCOUNTER — Encounter: Payer: Self-pay | Admitting: Cardiology

## 2016-09-24 VITALS — BP 136/58 | HR 66 | Ht 69.0 in | Wt 215.0 lb

## 2016-09-24 DIAGNOSIS — I483 Typical atrial flutter: Secondary | ICD-10-CM

## 2016-09-24 DIAGNOSIS — I2510 Atherosclerotic heart disease of native coronary artery without angina pectoris: Secondary | ICD-10-CM | POA: Diagnosis not present

## 2016-09-24 DIAGNOSIS — I70232 Atherosclerosis of native arteries of right leg with ulceration of calf: Secondary | ICD-10-CM | POA: Diagnosis not present

## 2016-09-24 NOTE — Patient Instructions (Signed)
Your physician recommends that you schedule a follow-up appointment in: 3 months Dr McDowell    Your physician recommends that you continue on your current medications as directed. Please refer to the Current Medication list given to you today.     Thank you for choosing [facility] !         

## 2016-10-01 DIAGNOSIS — I4891 Unspecified atrial fibrillation: Secondary | ICD-10-CM | POA: Diagnosis not present

## 2016-10-01 DIAGNOSIS — C73 Malignant neoplasm of thyroid gland: Secondary | ICD-10-CM | POA: Diagnosis not present

## 2016-10-01 DIAGNOSIS — J449 Chronic obstructive pulmonary disease, unspecified: Secondary | ICD-10-CM | POA: Diagnosis not present

## 2016-10-01 DIAGNOSIS — I2510 Atherosclerotic heart disease of native coronary artery without angina pectoris: Secondary | ICD-10-CM | POA: Diagnosis not present

## 2016-10-07 DIAGNOSIS — C73 Malignant neoplasm of thyroid gland: Secondary | ICD-10-CM | POA: Diagnosis not present

## 2016-10-07 DIAGNOSIS — E890 Postprocedural hypothyroidism: Secondary | ICD-10-CM | POA: Diagnosis not present

## 2016-10-14 ENCOUNTER — Other Ambulatory Visit (HOSPITAL_COMMUNITY): Payer: Self-pay | Admitting: Endocrinology

## 2016-10-14 DIAGNOSIS — C73 Malignant neoplasm of thyroid gland: Secondary | ICD-10-CM

## 2016-10-26 DIAGNOSIS — E1140 Type 2 diabetes mellitus with diabetic neuropathy, unspecified: Secondary | ICD-10-CM | POA: Diagnosis not present

## 2016-10-26 DIAGNOSIS — E1151 Type 2 diabetes mellitus with diabetic peripheral angiopathy without gangrene: Secondary | ICD-10-CM | POA: Diagnosis not present

## 2016-11-01 ENCOUNTER — Ambulatory Visit (HOSPITAL_COMMUNITY): Payer: Medicare Other

## 2016-11-02 ENCOUNTER — Ambulatory Visit (HOSPITAL_COMMUNITY): Payer: Medicare Other

## 2016-11-03 ENCOUNTER — Encounter (HOSPITAL_COMMUNITY): Payer: Medicare Other

## 2016-11-08 ENCOUNTER — Ambulatory Visit (HOSPITAL_COMMUNITY): Payer: Medicare Other

## 2016-11-08 ENCOUNTER — Encounter (HOSPITAL_COMMUNITY)
Admission: RE | Admit: 2016-11-08 | Discharge: 2016-11-08 | Disposition: A | Discharge status: Home / Self Care | Payer: Medicare Other | Source: Ambulatory Visit | Attending: Endocrinology | Admitting: Endocrinology

## 2016-11-08 DIAGNOSIS — C73 Malignant neoplasm of thyroid gland: Secondary | ICD-10-CM

## 2016-11-08 DIAGNOSIS — R948 Abnormal results of function studies of other organs and systems: Secondary | ICD-10-CM | POA: Diagnosis not present

## 2016-11-08 MED ORDER — STERILE WATER FOR INJECTION IJ SOLN
INTRAMUSCULAR | Status: AC
  Filled 2016-11-08: qty 10

## 2016-11-08 MED ORDER — THYROTROPIN ALFA 1.1 MG IM SOLR
0.9000 mg | INTRAMUSCULAR | Status: AC
  Administered 2016-11-08: 0.9 mg via INTRAMUSCULAR

## 2016-11-09 ENCOUNTER — Ambulatory Visit (HOSPITAL_COMMUNITY): Payer: Medicare Other

## 2016-11-09 ENCOUNTER — Encounter (HOSPITAL_COMMUNITY)
Admission: RE | Admit: 2016-11-09 | Discharge: 2016-11-09 | Disposition: A | Discharge status: Home / Self Care | Payer: Medicare Other | Source: Ambulatory Visit | Attending: Endocrinology | Admitting: Endocrinology

## 2016-11-09 DIAGNOSIS — R948 Abnormal results of function studies of other organs and systems: Secondary | ICD-10-CM | POA: Diagnosis not present

## 2016-11-09 DIAGNOSIS — C73 Malignant neoplasm of thyroid gland: Secondary | ICD-10-CM | POA: Insufficient documentation

## 2016-11-09 MED ORDER — STERILE WATER FOR INJECTION IJ SOLN
INTRAMUSCULAR | Status: AC
  Filled 2016-11-09: qty 10

## 2016-11-09 MED ORDER — THYROTROPIN ALFA 1.1 MG IM SOLR
0.9000 mg | INTRAMUSCULAR | Status: AC
  Administered 2016-11-09: 0.9 mg via INTRAMUSCULAR

## 2016-11-10 ENCOUNTER — Encounter (HOSPITAL_COMMUNITY)
Admission: RE | Admit: 2016-11-10 | Discharge: 2016-11-10 | Disposition: A | Discharge status: Home / Self Care | Payer: Medicare Other | Source: Ambulatory Visit | Attending: Endocrinology | Admitting: Endocrinology

## 2016-11-10 ENCOUNTER — Encounter (HOSPITAL_COMMUNITY): Payer: Medicare Other

## 2016-11-10 DIAGNOSIS — R948 Abnormal results of function studies of other organs and systems: Secondary | ICD-10-CM | POA: Diagnosis not present

## 2016-11-10 DIAGNOSIS — C73 Malignant neoplasm of thyroid gland: Secondary | ICD-10-CM | POA: Diagnosis not present

## 2016-11-10 MED ORDER — SODIUM IODIDE I 131 CAPSULE
150.1000 | Freq: Once | INTRAVENOUS | Status: AC | PRN
  Administered 2016-11-10: 150.1 via ORAL

## 2016-11-15 ENCOUNTER — Other Ambulatory Visit (HOSPITAL_COMMUNITY): Payer: Medicare Other

## 2016-11-19 ENCOUNTER — Encounter (HOSPITAL_COMMUNITY)
Admission: RE | Admit: 2016-11-19 | Discharge: 2016-11-19 | Disposition: A | Discharge status: Home / Self Care | Payer: Medicare Other | Source: Ambulatory Visit | Attending: Endocrinology | Admitting: Endocrinology

## 2016-11-19 DIAGNOSIS — C73 Malignant neoplasm of thyroid gland: Secondary | ICD-10-CM | POA: Insufficient documentation

## 2016-11-19 DIAGNOSIS — R948 Abnormal results of function studies of other organs and systems: Secondary | ICD-10-CM | POA: Diagnosis not present

## 2016-12-10 DIAGNOSIS — I10 Essential (primary) hypertension: Secondary | ICD-10-CM | POA: Diagnosis not present

## 2016-12-10 DIAGNOSIS — C73 Malignant neoplasm of thyroid gland: Secondary | ICD-10-CM | POA: Diagnosis not present

## 2016-12-10 DIAGNOSIS — E890 Postprocedural hypothyroidism: Secondary | ICD-10-CM | POA: Diagnosis not present

## 2016-12-27 ENCOUNTER — Encounter: Payer: Self-pay | Admitting: Cardiology

## 2016-12-27 ENCOUNTER — Other Ambulatory Visit (HOSPITAL_COMMUNITY)
Admission: RE | Admit: 2016-12-27 | Discharge: 2016-12-27 | Disposition: A | Discharge status: Home / Self Care | Payer: Medicare Other | Source: Ambulatory Visit | Attending: Cardiology | Admitting: Cardiology

## 2016-12-27 ENCOUNTER — Ambulatory Visit (INDEPENDENT_AMBULATORY_CARE_PROVIDER_SITE_OTHER): Payer: Medicare Other | Admitting: Cardiology

## 2016-12-27 VITALS — BP 152/52 | HR 84 | Ht 69.0 in | Wt 216.0 lb

## 2016-12-27 DIAGNOSIS — I779 Disorder of arteries and arterioles, unspecified: Secondary | ICD-10-CM

## 2016-12-27 DIAGNOSIS — Z79899 Other long term (current) drug therapy: Secondary | ICD-10-CM

## 2016-12-27 DIAGNOSIS — E782 Mixed hyperlipidemia: Secondary | ICD-10-CM | POA: Diagnosis not present

## 2016-12-27 DIAGNOSIS — Z8679 Personal history of other diseases of the circulatory system: Secondary | ICD-10-CM

## 2016-12-27 DIAGNOSIS — I739 Peripheral vascular disease, unspecified: Secondary | ICD-10-CM

## 2016-12-27 DIAGNOSIS — I2510 Atherosclerotic heart disease of native coronary artery without angina pectoris: Secondary | ICD-10-CM | POA: Diagnosis not present

## 2016-12-27 LAB — BASIC METABOLIC PANEL
ANION GAP: 9 (ref 5–15)
BUN: 21 mg/dL — ABNORMAL HIGH (ref 6–20)
CALCIUM: 9.4 mg/dL (ref 8.9–10.3)
CO2: 25 mmol/L (ref 22–32)
Chloride: 105 mmol/L (ref 101–111)
Creatinine, Ser: 0.96 mg/dL (ref 0.61–1.24)
GFR calc Af Amer: >60 mL/min (ref >60)
GFR calc non Af Amer: >60 mL/min (ref >60)
GLUCOSE: 91 mg/dL (ref 65–99)
Potassium: 3.3 mmol/L — ABNORMAL LOW (ref 3.5–5.1)
Sodium: 139 mmol/L (ref 135–145)

## 2016-12-27 LAB — CBC
HEMATOCRIT: 35.4 % — AB (ref 39.0–52.0)
Hemoglobin: 11.1 g/dL — ABNORMAL LOW (ref 13.0–17.0)
MCH: 29.5 pg (ref 26.0–34.0)
MCHC: 31.4 g/dL (ref 30.0–36.0)
MCV: 94.1 fL (ref 78.0–100.0)
Platelets: 314 10*3/uL (ref 150–400)
RBC: 3.76 MIL/uL — ABNORMAL LOW (ref 4.22–5.81)
RDW: 14.5 % (ref 11.5–15.5)
WBC: 8 10*3/uL (ref 4.0–10.5)

## 2016-12-27 NOTE — Patient Instructions (Signed)
Your physician wants you to follow-up in: 6 months with Dr Ferne Reus will receive a reminder letter in the mail two months in advance. If you don't receive a letter, please call our office to schedule the follow-up appointment.   Your physician recommends that you continue on your current medications as directed. Please refer to the Current Medication list given to you today.    Your physician recommends that you return for lab work in: NOW, CBC, BMET    Your physician has requested that you have a carotid duplex just before your visit in 6 months. This test is an ultrasound of the carotid arteries in your neck. It looks at blood flow through these arteries that supply the brain with blood. Allow one hour for this exam. There are no restrictions or special instructions.     Thank you for choosing [facility] !

## 2016-12-27 NOTE — Progress Notes (Signed)
Cardiology Office Note  Date: 12/27/2016   ID: LINDA BIEHN, DOB 07-Jul-1936, MRN [id number]  PCP: Alonza Bogus, MD  Primary Cardiologist: Rozann Lesches, MD   Chief Complaint  Patient presents with  . Coronary Artery Disease    History of Present Illness: John Sampson is a 81 y.o. male last seen in October 2017. He presents for a routine follow-up visit. Reports no major change from a cardiac perspective since last evaluation. No angina symptoms and stable NYHA class II dyspnea. He has gotten over prior thyroid cancer surgery.  We discussed follow-up lab work on [employer]. Reports no bleeding problems.  Most recent ischemic testing was Myoview in 2013. I went over the results today.  He has not had recent carotid Dopplers.  Past Medical History:  Diagnosis Date  . Atrial flutter ([place])   . COPD (chronic obstructive pulmonary disease) ([place]) 3.12.14   2D Echo, EF 50-55%  . Coronary atherosclerosis of native coronary artery    Multivessel status post CABG  . Gastric ulcer    Small - nonbleeding  . GERD (gastroesophageal reflux disease)   . Headache(784.0)   . Iron deficiency anemia    Negative Givens capsule study   . Myocardial infarction   . PAD (peripheral artery disease) (HCC)    Moderate bilateral SFA disease at angiography 01/2013  . Pituitary macroadenoma [facility])     Past Surgical History:  Procedure Laterality Date  . ABDOMINAL AORTAGRAM N/A 02/19/2013   Procedure: ABDOMINAL Maxcine Ham;  Surgeon: [name], MD;  Location: [facility] CATH LAB;  Service: Cardiovascular;  Laterality: N/A;  . CARDIAC CATHETERIZATION    . CARDIOVERSION N/A 06/16/2015   Procedure: CARDIOVERSION;  Surgeon: Satira Sark, MD;  Location: [facility] ORS;  Service: Cardiovascular;  Laterality: N/A;  . COLONOSCOPY  08/18/2012   Procedure: COLONOSCOPY;  Surgeon: [name], MD;  Location: [facility] ENDO SUITE;  Service: Endoscopy;  Laterality: N/A;  1:25  . CORONARY ARTERY BYPASS GRAFT  2003   5  grafts - details not clear  . CRANIOTOMY  12/31/2011   Procedure: CRANIOTOMY HYPOPHYSECTOMY TRANSNASAL APPROACH;  Surgeon: [name], MD;  Location: [facility] NEURO ORS;  Service: Neurosurgery;  Laterality: N/A;  Transphenoidal Hypophysectomy With Fat Graft Harvest from right abdomen   . ESOPHAGOGASTRODUODENOSCOPY  08/18/2012   Procedure: ESOPHAGOGASTRODUODENOSCOPY (EGD);  Surgeon: [name], MD;  [facility] ENDO [facility];  Service: Endoscopy;  Laterality: N/A;  . EYE SURGERY  2012  . GIVENS CAPSULE STUDY N/A 01/25/2013   Procedure: GIVENS CAPSULE STUDY;  Surgeon: [name], MD;  [facility];  Service: Endoscopy;  Laterality: N/A;  730  . LARYNGOPLASTY Left 09/14/2016   Procedure: LARYNGOPLASTY;  Surgeon: [name], MD;  Location: [facility];  Service: ENT;  Laterality: Left;  . LOWER EXTREMITY ANGIOGRAM N/A 02/19/2013   Procedure: LOWER EXTREMITY ANGIOGRAM;  Surgeon: [name], MD;  Location: [facility] CATH LAB;  Service: Cardiovascular;  Laterality: N/A;  . PERIPHERAL VASCULAR CATHETERIZATION N/A 01/19/2016   Procedure: Abdominal Aortogram;  Surgeon: Conrad Hyattville, MD;  Location: [facility] CV LAB;  Service: Cardiovascular;  Laterality: N/A;  . PERIPHERAL VASCULAR CATHETERIZATION Bilateral 01/19/2016   Procedure: Lower Extremity Angiography;  Surgeon: Conrad Richardson, MD;  Location: [facility] CV LAB;  Service: Cardiovascular;  Laterality: Bilateral;  . PV Angiogram  02/19/13   Indications: slow healing left calf ulcer  . Stress Myocardial Perfusion  12/08/2011   Indications: Abnormal EKG, Eval of  prior GABG  . TEE WITHOUT CARDIOVERSION N/A 06/16/2015   Procedure: TRANSESOPHAGEAL ECHOCARDIOGRAM (TEE);  Surgeon: Satira Sark, MD;  Location: [facility];  Service: Cardiovascular;  Laterality: N/A;  . THYROIDECTOMY N/A 09/14/2016   Procedure: TOTAL THYROIDECTOMY;  Surgeon: [name], MD;  Location: [facility];  Service: ENT;  Laterality: N/A;  . TONSILLECTOMY  Age 76     Current Outpatient Prescriptions  Medication Sig Dispense Refill  . acetaminophen (TYLENOL) 325 MG tablet Take 650 mg by mouth daily as needed for headache.    . albuterol (PROVENTIL HFA) 108 (90 BASE) MCG/ACT inhaler Inhale 2 puffs into the lungs every 6 (six) hours as needed for wheezing or shortness of breath. 1 Inhaler 3  . apixaban (ELIQUIS) 5 MG TABS tablet Take 1 tablet (5 mg total) by mouth 2 (two) times daily. 60 tablet 3  . diltiazem (CARDIZEM CD) 240 MG 24 hr capsule Take 1 capsule (240 mg total) by mouth daily. 90 capsule 3  . docusate sodium (COLACE) 100 MG capsule Take 200 mg by mouth at bedtime.    . ferrous sulfate 325 (65 FE) MG tablet Take 1 tablet (325 mg total) by mouth 2 (two) times daily with a meal.    . fish oil-omega-3 fatty acids 1000 MG capsule Take 1 g by mouth 2 (two) times daily.     . folic acid (FOLVITE) 1 MG tablet Take 1 mg by mouth daily.     . furosemide (LASIX) 20 MG tablet Take 1 tablet (20 mg total) by mouth daily. (Patient taking differently: Take 20 mg by mouth daily as needed for fluid. ) 90 tablet 3  . levothyroxine (SYNTHROID, LEVOTHROID) 150 MCG tablet Take 150 mcg by mouth daily before breakfast.    . lisinopril (PRINIVIL,ZESTRIL) 10 MG tablet Take 1 tablet (10 mg total) by mouth daily. 90 tablet 3  . metoprolol succinate (TOPROL-XL) 50 MG 24 hr tablet Take 50 mg by mouth 2 (two) times daily. Take with or immediately following a meal.    . nitroGLYCERIN (NITROSTAT) 0.4 MG SL tablet Place 1 tablet (0.4 mg total) under the tongue every 5 (five) minutes as needed for chest pain. 30 tablet 9  . omeprazole (PRILOSEC) 20 MG capsule Take 20 mg by mouth 2 (two) times daily before a meal.    . potassium chloride SA (K-DUR,KLOR-CON) 20 MEQ tablet Take 20 mEq by mouth daily.    . simvastatin (ZOCOR) 40 MG tablet Take 0.5 tablets (20 mg total) by mouth at bedtime. 30 tablet 12   No current facility-administered medications for this visit.    Allergies:  No  known allergies   Social History: The patient  reports that he quit smoking about 14 years ago. His smoking use included Cigarettes. He started smoking about 61 years ago. He has a 135.00 pack-year smoking history. He has never used smokeless tobacco. He reports that he does not drink alcohol or use drugs.   ROS:  Please see the history of present illness. Otherwise, complete review of systems is positive for none.  All other systems are reviewed and negative.   Physical Exam: VS:  BP (!) 152/52   Pulse 84   Ht 5' 9"  (1.753 m)   Wt 216 lb (98 kg)   SpO2 98%   BMI 31.90 kg/m , BMI Body mass index is 31.9 kg/m.  Wt Readings from Last 3 Encounters:  12/27/16 216 lb (98 kg)  09/24/16 215 lb (97.5 kg)  09/14/16 217  lb (98.4 kg)    General: Overweight male, appears comfortable at rest. HEENT: Conjunctiva and lids normal, oropharynx clear. Neck: Supple, no elevated JVP or carotid bruits, no thyromegaly. Lungs: Clear to auscultation, nonlabored breathing at rest. Cardiac: Regular rate and rhythm with ectopy, no S3 or significant systolic murmur, no pericardial rub. Abdomen: Soft, nontender, protuberant, bowel sounds present, no guarding or rebound. Extremities: Mild leg edema , distal pulses 1-2, left greater than right +. Skin: Warm and dry. Musculoskeletal: No kyphosis. Neuropsychiatric: Alert and oriented 3, affect appropriate.  ECG: I personally reviewed the tracing from 08/23/2016 which showed sinus rhythm with anteroseptal and high lateral repolarization abnormalities, nonspecific diffuse ST-T changes.  Recent Labwork: 08/24/2016: ALT 16; AST 13 09/10/2016: BUN 14; Creatinine, Ser 1.12; Hemoglobin 12.1; Platelets 290; Potassium 4.3; Sodium 140     Component Value Date/Time   CHOL 184 08/14/2013 0818   TRIG 108 08/14/2013 0818   HDL 44 08/14/2013 0818   CHOLHDL 4.2 08/14/2013 0818   VLDL 22 08/14/2013 0818   LDLCALC 118 (H) 08/14/2013 0818    Other Studies Reviewed  Today:  Echocardiogram 01/01/2016: Study Conclusions  - Left ventricle: The cavity size was normal. Wall thickness was   increased in a pattern of moderate LVH. The estimated ejection   fraction was 55%. Probable hypokinesis of the basalinferior   myocardium. Doppler parameters are consistent with restrictive   physiology, indicative of decreased left ventricular diastolic   compliance and/or increased left atrial pressure. - Aortic valve: Mildly calcified annulus. Trileaflet; mildly   calcified leaflets. - Mitral valve: Calcified annulus. There was mild regurgitation. - Left atrium: The atrium was severely dilated. - Right atrium: Central venous pressure (est): 3 mm Hg. - Tricuspid valve: There was mild regurgitation. - Pulmonary arteries: Systolic pressure was moderately increased.   PA peak pressure: 53 mm Hg (S). - Pericardium, extracardiac: There was no pericardial effusion.  Impressions:  - Moderate LVH with LVEF approximately 55% and basal inferior   hypokinesis. Restrictive diastolic filling pattern is noted with   evidence of increased LV filling pressure. severe left atrial   enlargement. MAC with mild mitral regurgitation. Sclerotic aortic   valve without stenosis. Mild tricuspid regurgitation with   evidence of moderate pulmonary hypertension, PASP 53 mmHg.  Lexiscan Myoview 12/08/2011 [facility]): Moderate-sized inferolateral scar with mild peri-infarct ischemia, LVEF 43%.  Assessment and Plan:  1. CAD status post CABG in 2003. Follow-up ischemic testing from 2013 is reviewed above. He does not report any significant angina symptoms and we will continue with observation on medical therapy.  2. History of carotid artery disease, will arrange follow-up carotid Dopplers for his next visit. He continues on statin therapy.  3. History of atrial flutter, maintaining sinus rhythm. Continue Eliquis and Cardizem CD. Arranging follow-up CBC and BMET.  4. Hyperlipidemia, on  statin therapy. Keep follow-up with Dr. Luan Pulling.  Current medicines were reviewed with the patient today.   Orders Placed This Encounter  Procedures  . CBC  . Basic Metabolic Panel (BMET)    Disposition: Follow-up in 6 months.  Signed, Satira Sark, MD, [facility] 12/27/2016 11:24 AM    [facility] [place]. [address] [place], [place] [address] Phone: [phone]; Fax: [phone]

## 2017-01-11 DIAGNOSIS — E1151 Type 2 diabetes mellitus with diabetic peripheral angiopathy without gangrene: Secondary | ICD-10-CM | POA: Diagnosis not present

## 2017-01-11 DIAGNOSIS — E1140 Type 2 diabetes mellitus with diabetic neuropathy, unspecified: Secondary | ICD-10-CM | POA: Diagnosis not present

## 2017-01-14 DIAGNOSIS — I10 Essential (primary) hypertension: Secondary | ICD-10-CM | POA: Diagnosis not present

## 2017-01-14 DIAGNOSIS — I2510 Atherosclerotic heart disease of native coronary artery without angina pectoris: Secondary | ICD-10-CM | POA: Diagnosis not present

## 2017-01-14 DIAGNOSIS — C73 Malignant neoplasm of thyroid gland: Secondary | ICD-10-CM | POA: Diagnosis not present

## 2017-01-14 DIAGNOSIS — J449 Chronic obstructive pulmonary disease, unspecified: Secondary | ICD-10-CM | POA: Diagnosis not present

## 2017-01-19 DIAGNOSIS — M79671 Pain in right foot: Secondary | ICD-10-CM | POA: Diagnosis not present

## 2017-01-19 DIAGNOSIS — G5790 Unspecified mononeuropathy of unspecified lower limb: Secondary | ICD-10-CM | POA: Diagnosis not present

## 2017-01-19 DIAGNOSIS — G600 Hereditary motor and sensory neuropathy: Secondary | ICD-10-CM | POA: Diagnosis not present

## 2017-01-19 DIAGNOSIS — M79672 Pain in left foot: Secondary | ICD-10-CM | POA: Diagnosis not present

## 2017-02-08 DIAGNOSIS — M79672 Pain in left foot: Secondary | ICD-10-CM | POA: Diagnosis not present

## 2017-02-08 DIAGNOSIS — M25579 Pain in unspecified ankle and joints of unspecified foot: Secondary | ICD-10-CM | POA: Diagnosis not present

## 2017-02-08 DIAGNOSIS — G5790 Unspecified mononeuropathy of unspecified lower limb: Secondary | ICD-10-CM | POA: Diagnosis not present

## 2017-02-08 DIAGNOSIS — M792 Neuralgia and neuritis, unspecified: Secondary | ICD-10-CM | POA: Diagnosis not present

## 2017-02-08 DIAGNOSIS — G600 Hereditary motor and sensory neuropathy: Secondary | ICD-10-CM | POA: Diagnosis not present

## 2017-02-08 DIAGNOSIS — M79671 Pain in right foot: Secondary | ICD-10-CM | POA: Diagnosis not present

## 2017-02-16 DIAGNOSIS — C73 Malignant neoplasm of thyroid gland: Secondary | ICD-10-CM | POA: Diagnosis not present

## 2017-02-16 DIAGNOSIS — R490 Dysphonia: Secondary | ICD-10-CM | POA: Diagnosis not present

## 2017-02-28 DIAGNOSIS — M79671 Pain in right foot: Secondary | ICD-10-CM | POA: Diagnosis not present

## 2017-02-28 DIAGNOSIS — M25579 Pain in unspecified ankle and joints of unspecified foot: Secondary | ICD-10-CM | POA: Diagnosis not present

## 2017-02-28 DIAGNOSIS — M79672 Pain in left foot: Secondary | ICD-10-CM | POA: Diagnosis not present

## 2017-03-29 DIAGNOSIS — M25579 Pain in unspecified ankle and joints of unspecified foot: Secondary | ICD-10-CM | POA: Diagnosis not present

## 2017-03-29 DIAGNOSIS — M79671 Pain in right foot: Secondary | ICD-10-CM | POA: Diagnosis not present

## 2017-03-29 DIAGNOSIS — M79672 Pain in left foot: Secondary | ICD-10-CM | POA: Diagnosis not present

## 2017-03-30 ENCOUNTER — Encounter (HOSPITAL_COMMUNITY): Payer: Self-pay | Admitting: *Deleted

## 2017-03-30 ENCOUNTER — Emergency Department (HOSPITAL_COMMUNITY): Payer: Medicare Other

## 2017-03-30 ENCOUNTER — Inpatient Hospital Stay (HOSPITAL_COMMUNITY)
Admission: EM | Admit: 2017-03-30 | Discharge: 2017-04-01 | DRG: 378 | Disposition: A | Discharge status: Home / Self Care | Payer: Medicare Other | Attending: Pulmonary Disease | Admitting: Pulmonary Disease

## 2017-03-30 DIAGNOSIS — I872 Venous insufficiency (chronic) (peripheral): Secondary | ICD-10-CM | POA: Diagnosis present

## 2017-03-30 DIAGNOSIS — K257 Chronic gastric ulcer without hemorrhage or perforation: Secondary | ICD-10-CM | POA: Diagnosis not present

## 2017-03-30 DIAGNOSIS — K922 Gastrointestinal hemorrhage, unspecified: Secondary | ICD-10-CM | POA: Diagnosis not present

## 2017-03-30 DIAGNOSIS — J449 Chronic obstructive pulmonary disease, unspecified: Secondary | ICD-10-CM | POA: Diagnosis not present

## 2017-03-30 DIAGNOSIS — D1779 Benign lipomatous neoplasm of other sites: Secondary | ICD-10-CM | POA: Diagnosis present

## 2017-03-30 DIAGNOSIS — K5750 Diverticulosis of both small and large intestine without perforation or abscess without bleeding: Secondary | ICD-10-CM | POA: Diagnosis present

## 2017-03-30 DIAGNOSIS — Z8249 Family history of ischemic heart disease and other diseases of the circulatory system: Secondary | ICD-10-CM

## 2017-03-30 DIAGNOSIS — K921 Melena: Secondary | ICD-10-CM | POA: Diagnosis not present

## 2017-03-30 DIAGNOSIS — K219 Gastro-esophageal reflux disease without esophagitis: Secondary | ICD-10-CM | POA: Diagnosis present

## 2017-03-30 DIAGNOSIS — Z79899 Other long term (current) drug therapy: Secondary | ICD-10-CM

## 2017-03-30 DIAGNOSIS — D500 Iron deficiency anemia secondary to blood loss (chronic): Secondary | ICD-10-CM | POA: Diagnosis not present

## 2017-03-30 DIAGNOSIS — D649 Anemia, unspecified: Secondary | ICD-10-CM | POA: Diagnosis present

## 2017-03-30 DIAGNOSIS — I739 Peripheral vascular disease, unspecified: Secondary | ICD-10-CM | POA: Diagnosis present

## 2017-03-30 DIAGNOSIS — E890 Postprocedural hypothyroidism: Secondary | ICD-10-CM | POA: Diagnosis present

## 2017-03-30 DIAGNOSIS — Z7901 Long term (current) use of anticoagulants: Secondary | ICD-10-CM

## 2017-03-30 DIAGNOSIS — Z0389 Encounter for observation for other suspected diseases and conditions ruled out: Secondary | ICD-10-CM | POA: Diagnosis not present

## 2017-03-30 DIAGNOSIS — I10 Essential (primary) hypertension: Secondary | ICD-10-CM | POA: Diagnosis present

## 2017-03-30 DIAGNOSIS — I480 Paroxysmal atrial fibrillation: Secondary | ICD-10-CM | POA: Diagnosis present

## 2017-03-30 DIAGNOSIS — Z951 Presence of aortocoronary bypass graft: Secondary | ICD-10-CM | POA: Diagnosis not present

## 2017-03-30 DIAGNOSIS — I5032 Chronic diastolic (congestive) heart failure: Secondary | ICD-10-CM | POA: Diagnosis present

## 2017-03-30 DIAGNOSIS — E782 Mixed hyperlipidemia: Secondary | ICD-10-CM | POA: Diagnosis present

## 2017-03-30 DIAGNOSIS — Z66 Do not resuscitate: Secondary | ICD-10-CM | POA: Diagnosis not present

## 2017-03-30 DIAGNOSIS — K317 Polyp of stomach and duodenum: Secondary | ICD-10-CM | POA: Diagnosis present

## 2017-03-30 DIAGNOSIS — C73 Malignant neoplasm of thyroid gland: Secondary | ICD-10-CM | POA: Diagnosis not present

## 2017-03-30 DIAGNOSIS — R531 Weakness: Secondary | ICD-10-CM | POA: Diagnosis not present

## 2017-03-30 DIAGNOSIS — K31819 Angiodysplasia of stomach and duodenum without bleeding: Secondary | ICD-10-CM | POA: Diagnosis present

## 2017-03-30 DIAGNOSIS — D62 Acute posthemorrhagic anemia: Secondary | ICD-10-CM | POA: Diagnosis present

## 2017-03-30 DIAGNOSIS — Z87891 Personal history of nicotine dependence: Secondary | ICD-10-CM

## 2017-03-30 DIAGNOSIS — I252 Old myocardial infarction: Secondary | ICD-10-CM

## 2017-03-30 DIAGNOSIS — Z85850 Personal history of malignant neoplasm of thyroid: Secondary | ICD-10-CM

## 2017-03-30 DIAGNOSIS — I2510 Atherosclerotic heart disease of native coronary artery without angina pectoris: Secondary | ICD-10-CM | POA: Diagnosis present

## 2017-03-30 LAB — CBC WITH DIFFERENTIAL/PLATELET
BASOS ABS: 0 10*3/uL (ref 0.0–0.1)
BASOS PCT: 1 %
EOS PCT: 5 %
Eosinophils Absolute: 0.4 10*3/uL (ref 0.0–0.7)
HCT: 26.8 % — ABNORMAL LOW (ref 39.0–52.0)
Hemoglobin: 7.7 g/dL — ABNORMAL LOW (ref 13.0–17.0)
Lymphocytes Relative: 15 %
Lymphs Abs: 1.3 10*3/uL (ref 0.7–4.0)
MCH: 25.8 pg — ABNORMAL LOW (ref 26.0–34.0)
MCHC: 28.7 g/dL — ABNORMAL LOW (ref 30.0–36.0)
MCV: 89.9 fL (ref 78.0–100.0)
Monocytes Absolute: 0.9 10*3/uL (ref 0.1–1.0)
Monocytes Relative: 11 %
NEUTROS ABS: 5.9 10*3/uL (ref 1.7–7.7)
Neutrophils Relative %: 68 %
PLATELETS: 299 10*3/uL (ref 150–400)
RBC: 2.98 MIL/uL — ABNORMAL LOW (ref 4.22–5.81)
RDW: 15.3 % (ref 11.5–15.5)
WBC: 8.6 10*3/uL (ref 4.0–10.5)

## 2017-03-30 LAB — TROPONIN I

## 2017-03-30 LAB — URINALYSIS, ROUTINE W REFLEX MICROSCOPIC
Bilirubin Urine: NEGATIVE
GLUCOSE, UA: NEGATIVE mg/dL
Hgb urine dipstick: NEGATIVE
Ketones, ur: NEGATIVE mg/dL
LEUKOCYTES UA: NEGATIVE
Nitrite: NEGATIVE
PROTEIN: NEGATIVE mg/dL
Specific Gravity, Urine: 1.011 (ref 1.005–1.030)
pH: 6 (ref 5.0–8.0)

## 2017-03-30 LAB — PREPARE RBC (CROSSMATCH)

## 2017-03-30 LAB — BASIC METABOLIC PANEL
ANION GAP: 9 (ref 5–15)
BUN: 17 mg/dL (ref 6–20)
CALCIUM: 9.1 mg/dL (ref 8.9–10.3)
CO2: 23 mmol/L (ref 22–32)
Chloride: 109 mmol/L (ref 101–111)
Creatinine, Ser: 1.02 mg/dL (ref 0.61–1.24)
GLUCOSE: 106 mg/dL — AB (ref 65–99)
Potassium: 4.2 mmol/L (ref 3.5–5.1)
SODIUM: 141 mmol/L (ref 135–145)

## 2017-03-30 LAB — TSH: TSH: 0.013 u[IU]/mL — ABNORMAL LOW (ref 0.350–4.500)

## 2017-03-30 LAB — ABO/RH: ABO/RH(D): O POS

## 2017-03-30 LAB — POC OCCULT BLOOD, ED: FECAL OCCULT BLD: POSITIVE — AB

## 2017-03-30 MED ORDER — ALBUTEROL SULFATE (2.5 MG/3ML) 0.083% IN NEBU
2.5000 mg | INHALATION_SOLUTION | Freq: Four times a day (QID) | RESPIRATORY_TRACT | Status: DC | PRN
  Administered 2017-03-31: 2.5 mg via RESPIRATORY_TRACT
  Filled 2017-03-30: qty 3

## 2017-03-30 MED ORDER — ACETAMINOPHEN 325 MG PO TABS: 650.0000 mg | ORAL_TABLET | Freq: Four times a day (QID) | ORAL | Status: DC | PRN

## 2017-03-30 MED ORDER — PANTOPRAZOLE SODIUM 40 MG IV SOLR
40.0000 mg | Freq: Two times a day (BID) | INTRAVENOUS | Status: DC
  Administered 2017-03-30 – 2017-03-31 (×2): 40 mg via INTRAVENOUS
  Filled 2017-03-30 (×2): qty 40

## 2017-03-30 MED ORDER — METOPROLOL SUCCINATE ER 50 MG PO TB24
50.0000 mg | ORAL_TABLET | Freq: Two times a day (BID) | ORAL | Status: DC
  Filled 2017-03-30: qty 1

## 2017-03-30 MED ORDER — SIMVASTATIN 20 MG PO TABS
20.0000 mg | ORAL_TABLET | Freq: Every day | ORAL | Status: DC
  Administered 2017-03-30 – 2017-03-31 (×2): 20 mg via ORAL
  Filled 2017-03-30 (×2): qty 1

## 2017-03-30 MED ORDER — DILTIAZEM HCL ER COATED BEADS 240 MG PO CP24
240.0000 mg | ORAL_CAPSULE | Freq: Every day | ORAL | Status: DC
  Administered 2017-03-31 – 2017-04-01 (×2): 240 mg via ORAL
  Filled 2017-03-30 (×2): qty 1

## 2017-03-30 MED ORDER — ACETAMINOPHEN 650 MG RE SUPP: 650.0000 mg | Freq: Four times a day (QID) | RECTAL | Status: DC | PRN

## 2017-03-30 MED ORDER — ZOLPIDEM TARTRATE 5 MG PO TABS
5.0000 mg | ORAL_TABLET | Freq: Once | ORAL | Status: AC
  Administered 2017-03-31: 5 mg via ORAL
  Filled 2017-03-30: qty 1

## 2017-03-30 MED ORDER — FOLIC ACID 1 MG PO TABS
1.0000 mg | ORAL_TABLET | Freq: Every day | ORAL | Status: DC
  Administered 2017-03-31 – 2017-04-01 (×2): 1 mg via ORAL
  Filled 2017-03-30 (×2): qty 1

## 2017-03-30 MED ORDER — SODIUM CHLORIDE 0.9 % IV SOLN
10.0000 mL/h | Freq: Once | INTRAVENOUS | Status: DC
  Administered 2017-03-30: 10 mL/h via INTRAVENOUS

## 2017-03-30 MED ORDER — ONDANSETRON HCL 4 MG/2ML IJ SOLN: 4.0000 mg | Freq: Four times a day (QID) | INTRAMUSCULAR | Status: DC | PRN

## 2017-03-30 MED ORDER — LEVOTHYROXINE SODIUM 75 MCG PO TABS
150.0000 ug | ORAL_TABLET | Freq: Every day | ORAL | Status: DC
  Administered 2017-03-31 – 2017-04-01 (×2): 150 ug via ORAL
  Filled 2017-03-30 (×2): qty 2

## 2017-03-30 MED ORDER — ONDANSETRON HCL 4 MG PO TABS: 4.0000 mg | ORAL_TABLET | Freq: Four times a day (QID) | ORAL | Status: DC | PRN

## 2017-03-30 MED ORDER — LISINOPRIL 10 MG PO TABS
10.0000 mg | ORAL_TABLET | Freq: Every day | ORAL | Status: DC
  Administered 2017-03-31 – 2017-04-01 (×2): 10 mg via ORAL
  Filled 2017-03-30 (×2): qty 1

## 2017-03-30 NOTE — H&P (Addendum)
TRH H&P    Patient Demographics:    John Sampson, is a 81 y.o. male  MRN: [id number]  DOB - 1936/02/24  Admit Date - 03/30/2017  Referring MD/NP/PA: Dr. Tomi Bamberger  Outpatient Primary MD for the patient is Alonza Bogus, MD  Patient coming from: Home  Chief Complaint  Patient presents with  . Weakness      HPI:    John Sampson  is a 80 y.o. male, With history of COPD, CAD status post CABG, gastric ulcer, GERD, peripheral arterial disease, atrial flutter on eliquis came to hospital with  symptoms of generalized weakness for past 3 days. Patient says that he feels fatigued, with weakness and no energy. He denies vomiting or diarrhea. He does have history of COPD but denies worsening shortness of breath. He denies chest pain. He denies episode of passing out. Denies dizziness.  In ED Hemoglobin is 7.7, previous hemoglobin from 12/27/2016 was 11.1 Blood pressure 126/53, pulse 54 Stool for occult blood positive  Patient has had  endoscopy with colonoscopy done at  [facility] about 15 months ago. Result was normal as per patient. He denies using NSAIDs recently. He was also started on eliquis by cardiology about a year ago for atrial flutter. He denies vomiting blood. Has not noticed black colored stool.    Review of systems:    In addition to the HPI above,  No Fever-chills, No Headache, No changes with Vision or hearing, No problems swallowing food or Liquids, No Chest pain, + chronic shortness of breath from COPD No Abdominal pain, No Nausea or Vomiting, bowel movements are regular, No Blood in stool or Urine, No dysuria, No new skin rashes or bruises, No new joints pains-aches,  No new weakness, tingling, numbness in any extremity + Thyroidectomy done last year  A full 10 point Review of Systems was done, except as stated above, all other Review of Systems were negative.   With  Past History of the following :    Past Medical History:  Diagnosis Date  . Atrial flutter ([place])   . COPD (chronic obstructive pulmonary disease) ([place]) 3.12.14   2D Echo, EF 50-55%  . Coronary atherosclerosis of native coronary artery    Multivessel status post CABG  . Gastric ulcer    Small - nonbleeding  . GERD (gastroesophageal reflux disease)   . Headache(784.0)   . Iron deficiency anemia    Negative Givens capsule study   . Myocardial infarction   . PAD (peripheral artery disease) ([facility])    Moderate bilateral SFA disease at angiography 01/2013  . Pituitary macroadenoma [facility])       Past Surgical History:  Procedure Laterality Date  . ABDOMINAL AORTAGRAM N/A 02/19/2013   Procedure: ABDOMINAL Maxcine Ham;  Surgeon: [name], MD;  Location: [facility] CATH LAB;  Service: Cardiovascular;  Laterality: N/A;  . CARDIAC CATHETERIZATION    . CARDIOVERSION N/A 06/16/2015   Procedure: CARDIOVERSION;  Surgeon: Satira Sark, MD;  Location: [facility] ORS;  Service: Cardiovascular;  Laterality: N/A;  . COLONOSCOPY  08/18/2012  Procedure: COLONOSCOPY;  Surgeon: [name], MD;  Location: [facility] ENDO SUITE;  Service: Endoscopy;  Laterality: N/A;  1:25  . CORONARY ARTERY BYPASS GRAFT  2003   5 grafts - details not clear  . CRANIOTOMY  12/31/2011   Procedure: CRANIOTOMY HYPOPHYSECTOMY TRANSNASAL APPROACH;  Surgeon: [name], MD;  Location: [place] NEURO ORS;  Service: Neurosurgery;  Laterality: N/A;  Transphenoidal Hypophysectomy With Fat Graft Harvest from right abdomen   . ESOPHAGOGASTRODUODENOSCOPY  08/18/2012   Procedure: ESOPHAGOGASTRODUODENOSCOPY (EGD);  Surgeon: [name], MD;  Location: [facility];  Service: Endoscopy;  Laterality: N/A;  . EYE SURGERY  2012  . GIVENS CAPSULE STUDY N/A 01/25/2013   Procedure: GIVENS CAPSULE STUDY;  Surgeon: [name], MD;  Location: [facility] END[facility];  Service: Endoscopy;  Laterality: N/A;  730  . LARYNGOPLASTY Left 09/14/2016   Procedure:  LARYNGOPLASTY;  Surgeon: [name], MD;  Location: [place];  Service: ENT;  Laterality: Left;  . LOWER EXTREMITY ANGIOGRAM N/A 02/19/2013   Procedure: LOWER EXTREMITY ANGIOGRAM;  Surgeon: [name], MD;  Location: [facility] CATH LAB;  Service: Cardiovascular;  Laterality: N/A;  . PERIPHERAL VASCULAR CATHETERIZATION N/A 01/19/2016   Procedure: Abdominal Aortogram;  Surgeon: Conrad Saylorville, MD;  Location: [place] CV LAB;  Service: Cardiovascular;  Laterality: N/A;  . PERIPHERAL VASCULAR CATHETERIZATION Bilateral 01/19/2016   Procedure: Lower Extremity Angiography;  Surgeon: Conrad Holliday, MD;  Location: [place] CV LAB;  Service: Cardiovascular;  Laterality: Bilateral;  . PV Angiogram  02/19/13   Indications: slow healing left calf ulcer  . Stress Myocardial Perfusion  12/08/2011   Indications: Abnormal EKG, Eval of prior GABG  . TEE WITHOUT CARDIOVERSION N/A 06/16/2015   Procedure: TRANSESOPHAGEAL ECHOCARDIOGRAM (TEE);  Surgeon: Satira Sark, MD;  Location: [facility] ORS;  Service: Cardiovascular;  Laterality: N/A;  . THYROIDECTOMY N/A 09/14/2016   Procedure: TOTAL THYROIDECTOMY;  Surgeon: [name], MD;  Location: [place];  Service: ENT;  Laterality: N/A;  . TONSILLECTOMY  Age 33      Social History:      Social History  Substance Use Topics  . Smoking status: Former Smoker    Packs/day: 3.00    Years: 45.00    Types: Cigarettes    Start date: 01/24/1955    Quit date: 01/23/2002  . Smokeless tobacco: Never Used     Comment: Quit 11 yrs ago  . Alcohol use No       Family History :     Family History  Problem Relation Age of Onset  . CAD Mother   . Heart disease Father     before age 24      Home Medications:   Prior to Admission medications   Medication Sig Start Date End Date Taking? Authorizing Provider  acetaminophen (TYLENOL) 325 MG tablet Take 650 mg by mouth daily as needed for headache.   Yes Historical Provider, MD  albuterol (PROVENTIL HFA) 108  (90 BASE) MCG/ACT inhaler Inhale 2 puffs into the lungs every 6 (six) hours as needed for wheezing or shortness of breath. 11/15/14  Yes Satira Sark, MD  apixaban (ELIQUIS) 5 MG TABS tablet Take 1 tablet (5 mg total) by mouth 2 (two) times daily. 12/24/15  Yes Luke K Kilroy, PA-C  diltiazem (CARDIZEM CD) 240 MG 24 hr capsule Take 1 capsule (240 mg total) by mouth daily. 05/30/15  Yes Lendon Colonel, NP  ferrous sulfate 325 (65 FE) MG tablet Take  1 tablet (325 mg total) by mouth 2 (two) times daily with a meal. 01/23/13  Yes [name], MD  fish oil-omega-3 fatty acids 1000 MG capsule Take 1 g by mouth 2 (two) times daily.    Yes Historical Provider, MD  folic acid (FOLVITE) 1 MG tablet Take 1 mg by mouth daily.    Yes Historical Provider, MD  furosemide (LASIX) 20 MG tablet Take 1 tablet (20 mg total) by mouth daily. Patient taking differently: Take 20 mg by mouth daily as needed for fluid.  03/01/16  Yes Satira Sark, MD  levothyroxine (SYNTHROID, LEVOTHROID) 150 MCG tablet Take 150 mcg by mouth daily before breakfast.   Yes Historical Provider, MD  lisinopril (PRINIVIL,ZESTRIL) 10 MG tablet Take 1 tablet (10 mg total) by mouth daily. 05/30/15  Yes Lendon Colonel, NP  metoprolol succinate (TOPROL-XL) 50 MG 24 hr tablet Take 50 mg by mouth 2 (two) times daily. Take with or immediately following a meal.   Yes Historical Provider, MD  nitroGLYCERIN (NITROSTAT) 0.4 MG SL tablet Place 1 tablet (0.4 mg total) under the tongue every 5 (five) minutes as needed for chest pain. 06/07/14  Yes Satira Sark, MD  omeprazole (PRILOSEC) 20 MG capsule Take 20 mg by mouth 2 (two) times daily before a meal.   Yes Historical Provider, MD  potassium chloride SA (K-DUR,KLOR-CON) 20 MEQ tablet Take 20 mEq by mouth daily.   Yes Historical Provider, MD  simvastatin (ZOCOR) 40 MG tablet Take 0.5 tablets (20 mg total) by mouth at bedtime. 07/25/16  Yes Sinda Du, MD     Allergies:     Allergies    Allergen Reactions  . No Known Allergies      Physical Exam:   Vitals  Blood pressure (!) 126/53, pulse (!) 54, temperature 97.6 F (36.4 C), temperature source Oral, resp. rate 19, height 5\' 9"  (1.753 m), weight 99.8 kg (220 lb), SpO2 93 %.  1.  General: Appears in no acute distress  2. Psychiatric:  Intact judgement and  insight, awake alert, oriented x 3.  3. Neurologic: No focal neurological deficits, all cranial nerves intact.Strength 5/5 all 4 extremities, sensation intact all 4 extremities, plantars down going.  4. Eyes :  anicteric sclerae, pale looking palpebral conjunctivae  5. ENMT:  Oropharynx clear with moist mucous membranes and good dentition  6. Neck:  supple, no cervical lymphadenopathy appriciated, No thyromegaly  7. Respiratory : Normal respiratory effort, good air movement bilaterally,clear to  auscultation bilaterally  8. Cardiovascular : RRR, no gallops, rubs or murmurs, no leg edema  9. Gastrointestinal:  Positive bowel sounds, abdomen soft, non-tender to palpation,no hepatosplenomegaly, no rigidity or guarding       10. Skin:  No cyanosis, normal texture and turgor, no rash, lesions or ulcers  11.Musculoskeletal:  Good muscle tone,  joints appear normal , no effusions,  normal range of motion    Data Review:    CBC  Recent Labs Lab 03/30/17 1720  WBC 8.6  HGB 7.7*  HCT 26.8*  PLT 299  MCV 89.9  MCH 25.8*  MCHC 28.7*  RDW 15.3  LYMPHSABS 1.3  MONOABS 0.9  EOSABS 0.4  BASOSABS 0.0   ------------------------------------------------------------------------------------------------------------------  Chemistries   Recent Labs Lab 03/30/17 1720  NA 141  K 4.2  CL 109  CO2 23  GLUCOSE 106*  BUN 17  CREATININE 1.02  CALCIUM 9.1    ------------------------------------------------------------------------------------------------------------------  ------------------------------------------------------------------------------------------  Recent Labs Lab 03/30/17 1726  TROPONINI <  0.03    --------------------------------------------------------------------------------------------------------------- Urine analysis:    Component Value Date/Time   COLORURINE YELLOW 03/30/2017 1830   APPEARANCEUR CLEAR 03/30/2017 1830   LABSPEC 1.011 03/30/2017 1830   PHURINE 6.0 03/30/2017 1830   GLUCOSEU NEGATIVE 03/30/2017 1830   HGBUR NEGATIVE 03/30/2017 1830   BILIRUBINUR NEGATIVE 03/30/2017 1830   KETONESUR NEGATIVE 03/30/2017 1830   PROTEINUR NEGATIVE 03/30/2017 1830   NITRITE NEGATIVE 03/30/2017 1830   LEUKOCYTESUR NEGATIVE 03/30/2017 1830      Imaging Results:    Dg Chest 2 View  Result Date: 03/30/2017 CLINICAL DATA:  Generalized weakness EXAM: CHEST  2 VIEW COMPARISON:  08/23/2016 FINDINGS: Cardiac shadow is stable. Postoperative changes are again seen. The lungs are well aerated bilaterally. No infiltrate or effusion is seen. No acute bony abnormality is noted. IMPRESSION: No active cardiopulmonary disease. Electronically Signed   By: Inez Catalina M.D.   On: 03/30/2017 18:16    My personal review of EKG: Rhythm NSR, T-wave inversions in lead 1, aVL, V2 which are  unchanged since September 2017   Assessment & Plan:    Active Problems:   GI bleed   Anemia   CAD   History of atrial flutter  1. GI bleed- patient tested positive for stool for guaiac in the ED.He has a history of gastric ulcer in the past. [facility] consult gastroenterology in a.m. Will hold eliquis. Patient's vitals are stable, will place under observation in telemetry. 2. Anemia- patient singleton found to be 7.7, previous hemoglobin was 11.1 in January 2018. 1 unit PRBC has been ordered in the ED. Will follow CBC in a.m. 3. CAD- stable, EKG shows  no new changes. Cardiac enzymes, troponin 1 is negative in the ED. Continue Toprol-XL, simvastatin. 4. History of atrial flutter- heart rate is controlled, patient currently in normal sinus rhythm. Will continue Toprol-XL, Cardizem. Hold eliquis due to above. We will closely monitor the patient on telemetry. 5. Hypertension-blood pressure is controlled, continue excellent, Cardizem, lisinopril 6. Hypothyroidism-status post thyroidectomy, continue replacement with levothyroxine 150 g by mouth daily. Will check TSH 7. COPD- stable, not in exacerbation. Continue albuterol when necessary   DVT Prophylaxis-   SCDs   AM Labs Ordered, also please review Full Orders  Family Communication: Admission, patients condition and plan of care including tests being ordered have been discussed with the patient and His family members at bedside who indicate understanding and agree with the plan and Code Status.  Code Status:  DO NOT RESUSCITATE  Admission status: Observation    Time spent in minutes : 55 minutes   Taurus Alamo S M.D on 03/30/2017 at 7:44 PM  Between 7am to 7pm - Pager - [phone]. After 7pm go to [email or web address] - password [facility]  [facility] - Office  [phone]

## 2017-03-30 NOTE — ED Provider Notes (Signed)
[facility] DEPT Provider Note   CSN: [id number] Arrival date & time: 03/30/17  1646     History   Chief Complaint Chief Complaint  Patient presents with  . Weakness    HPI John Sampson is a 81 y.o. male.  HPI Pt states he started having generalized weakness yesterday.  He feels fatigued, weak all over and no energy.  No fevers.  No vomiting  Or diarrhea.  No blood in his stool.  No chest pain.  No more shortness of breath than usual with his COPD.  Past Medical History:  Diagnosis Date  . Atrial flutter ([place])   . COPD (chronic obstructive pulmonary disease) ([place]) 3.12.14   2D Echo, EF 50-55%  . Coronary atherosclerosis of native coronary artery    Multivessel status post CABG  . Gastric ulcer    Small - nonbleeding  . GERD (gastroesophageal reflux disease)   . Headache(784.0)   . Iron deficiency anemia    Negative Givens capsule study   . Myocardial infarction   . PAD (peripheral artery disease) (H[facility])    Moderate bilateral SFA disease at angiography 01/2013  . Pituitary macroadenoma [facility])     Patient Active Problem List   Diagnosis Date Noted  . GI bleed 03/30/2017  . Thyroid cancer ([place]) 09/14/2016  . HCAP (healthcare-associated pneumonia) 08/24/2016  . Pneumonia 08/23/2016  . Chronic diastolic congestive heart failure ([place]) 07/24/2016  . Cellulitis of right arm 07/20/2016  . Cellulitis and abscess of hand 07/20/2016  . Venous stasis ulcers ([place]) 12/24/2015  . Chronic venous insufficiency 12/24/2015  . Atherosclerosis of extremity with ulceration ([place]) 12/24/2015  . CHF (congestive heart failure) ([place]) 12/24/2015  . Critical lower limb ischemia 12/24/2015  . Obesity 12/24/2015  . PAF (paroxysmal atrial fibrillation) ([place]) 12/24/2015  . PAD (peripheral artery disease) ([place]) 06/07/2014  . COPD (chronic obstructive pulmonary disease) ([place]) 01/23/2013  . Anemia 08/03/2012  . Essential hypertension, benign 08/03/2012  . Mixed hyperlipidemia 08/03/2012  . Hx  of CABG 08/03/2012    Past Surgical History:  Procedure Laterality Date  . ABDOMINAL AORTAGRAM N/A 02/19/2013   Procedure: ABDOMINAL Maxcine Ham;  Surgeon: [name], MD;  Location: [facility] CATH LAB;  Service: Cardiovascular;  Laterality: N/A;  . CARDIAC CATHETERIZATION    . CARDIOVERSION N/A 06/16/2015   Procedure: CARDIOVERSION;  Surgeon: Satira Sark, MD;  Location: [facility] ORS;  Service: Cardiovascular;  Laterality: N/A;  . COLONOSCOPY  08/18/2012   Procedure: COLONOSCOPY;  Surgeon: [name], MD;  Location: [facility] ENDO SUITE;  Service: Endoscopy;  Laterality: N/A;  1:25  . CORONARY ARTERY BYPASS GRAFT  2003   5 grafts - details not clear  . CRANIOTOMY  12/31/2011   Procedure: CRANIOTOMY HYPOPHYSECTOMY TRANSNASAL APPROACH;  Surgeon: [name], MD;  Location: [place] NEURO ORS;  Service: Neurosurgery;  Laterality: N/A;  Transphenoidal Hypophysectomy With Fat Graft Harvest from right abdomen   . ESOPHAGOGASTRODUODENOSCOPY  08/18/2012   Procedure: ESOPHAGOGASTRODUODENOSCOPY (EGD);  Surgeon: [name], MD;  [facility];  Service: Endoscopy;  Laterality: N/A;  . EYE SURGERY  2012  . GIVENS CAPSULE STUDY N/A 01/25/2013   Procedure: GIVENS CAPSULE STUDY;  Surgeon: [name], MD;  [facility];  Service: Endoscopy;  Laterality: N/A;  730  . LARYNGOPLASTY Left 09/14/2016   Procedure: LARYNGOPLASTY;  Surgeon: [name], MD;  Location: [facility];  Service: ENT;  Laterality: Left;  . LOWER EXTREMITY ANGIOGRAM N/A 02/19/2013   Procedure: LOWER EXTREMITY  ANGIOGRAM;  Surgeon: [name], MD;  Location: [facility] CATH LAB;  Service: Cardiovascular;  Laterality: N/A;  . PERIPHERAL VASCULAR CATHETERIZATION N/A 01/19/2016   Procedure: Abdominal Aortogram;  Surgeon: Conrad [place], MD;  Location: [facility] CV LAB;  Service: Cardiovascular;  Laterality: N/A;  . PERIPHERAL VASCULAR CATHETERIZATION Bilateral 01/19/2016   Procedure: Lower Extremity Angiography;  Surgeon: Conrad Merkel, MD;  Location: [name];  Service: Cardiovascular;  Laterality: Bilateral;  . PV Angiogram  02/19/13   Indications: slow healing left calf ulcer  . Stress Myocardial Perfusion  12/08/2011   Indications: Abnormal EKG, Eval of prior GABG  . TEE WITHOUT CARDIOVERSION N/A 06/16/2015   Procedure: TRANSESOPHAGEAL ECHOCARDIOGRAM (TEE);  Surgeon: Satira Sark, MD;  Location: [facility] ORS;  Service: Cardiovascular;  Laterality: N/A;  . THYROIDECTOMY N/A 09/14/2016   Procedure: TOTAL THYROIDECTOMY;  Surgeon: [name], MD;  Location: [place];  Service: ENT;  Laterality: N/A;  . TONSILLECTOMY  Age 57       Home Medications    Prior to Admission medications   Medication Sig Start Date End Date Taking? Authorizing Provider  acetaminophen (TYLENOL) 325 MG tablet Take 650 mg by mouth daily as needed for headache.   Yes Historical Provider, MD  albuterol (PROVENTIL HFA) 108 (90 BASE) MCG/ACT inhaler Inhale 2 puffs into the lungs every 6 (six) hours as needed for wheezing or shortness of breath. 11/15/14  Yes Satira Sark, MD  apixaban (ELIQUIS) 5 MG TABS tablet Take 1 tablet (5 mg total) by mouth 2 (two) times daily. 12/24/15  Yes Luke K Kilroy, PA-C  diltiazem (CARDIZEM CD) 240 MG 24 hr capsule Take 1 capsule (240 mg total) by mouth daily. 05/30/15  Yes Lendon Colonel, NP  ferrous sulfate 325 (65 FE) MG tablet Take 1 tablet (325 mg total) by mouth 2 (two) times daily with a meal. 01/23/13  Yes [name], MD  fish oil-omega-3 fatty acids 1000 MG capsule Take 1 g by mouth 2 (two) times daily.    Yes Historical Provider, MD  folic acid (FOLVITE) 1 MG tablet Take 1 mg by mouth daily.    Yes Historical Provider, MD  furosemide (LASIX) 20 MG tablet Take 1 tablet (20 mg total) by mouth daily. Patient taking differently: Take 20 mg by mouth daily as needed for fluid.  03/01/16  Yes Satira Sark, MD  levothyroxine (SYNTHROID, LEVOTHROID) 150 MCG tablet Take 150 mcg by mouth  daily before breakfast.   Yes Historical Provider, MD  lisinopril (PRINIVIL,ZESTRIL) 10 MG tablet Take 1 tablet (10 mg total) by mouth daily. 05/30/15  Yes Lendon Colonel, NP  metoprolol succinate (TOPROL-XL) 50 MG 24 hr tablet Take 50 mg by mouth 2 (two) times daily. Take with or immediately following a meal.   Yes Historical Provider, MD  nitroGLYCERIN (NITROSTAT) 0.4 MG SL tablet Place 1 tablet (0.4 mg total) under the tongue every 5 (five) minutes as needed for chest pain. 06/07/14  Yes Satira Sark, MD  omeprazole (PRILOSEC) 20 MG capsule Take 20 mg by mouth 2 (two) times daily before a meal.   Yes Historical Provider, MD  potassium chloride SA (K-DUR,KLOR-CON) 20 MEQ tablet Take 20 mEq by mouth daily.   Yes Historical Provider, MD  simvastatin (ZOCOR) 40 MG tablet Take 0.5 tablets (20 mg total) by mouth at bedtime. 07/25/16  Yes Sinda Du, MD    Family History Family History  Problem Relation Age of Onset  .  CAD Mother   . Heart disease Father     before age 40    Social History Social History  Substance Use Topics  . Smoking status: Former Smoker    Packs/day: 3.00    Years: 45.00    Types: Cigarettes    Start date: 01/24/1955    Quit date: 01/23/2002  . Smokeless tobacco: Never Used     Comment: Quit 11 yrs ago  . Alcohol use No     Allergies   No known allergies   Review of Systems Review of Systems  All other systems reviewed and are negative.    Physical Exam Updated Vital Signs BP (!) 123/51 (BP Location: Right Arm)   Pulse (!) 52   Temp 97.6 F (36.4 C) (Oral)   Resp 18   Ht 5\' 9"  (1.753 m)   Wt 99.8 kg   SpO2 93%   BMI 32.49 kg/m   Physical Exam  Constitutional: No distress.  HENT:  Head: Normocephalic and atraumatic.  Right Ear: External ear normal.  Left Ear: External ear normal.  Eyes: Conjunctivae are normal. Right eye exhibits no discharge. Left eye exhibits no discharge. No scleral icterus.  Neck: Neck supple. No tracheal  deviation present.  Cardiovascular: Normal rate, regular rhythm and intact distal pulses.   Pulmonary/Chest: Effort normal and breath sounds normal. No stridor. No respiratory distress. He has no wheezes. He has no rales.  Abdominal: Soft. Bowel sounds are normal. He exhibits no distension. There is no tenderness. There is no rebound and no guarding.  Musculoskeletal: He exhibits edema. He exhibits no tenderness (mild edema ankles).  Neurological: He is alert. He has normal strength. No cranial nerve deficit (no facial droop, extraocular movements intact, no slurred speech) or sensory deficit. He exhibits normal muscle tone. He displays no seizure activity. Coordination normal.  Skin: Skin is warm and dry. No rash noted. He is not diaphoretic.  Psychiatric: He has a normal mood and affect.  Nursing note and vitals reviewed.    ED Treatments / Results  Labs (all labs ordered are listed, but only abnormal results are displayed) Labs Reviewed  BASIC METABOLIC PANEL - Abnormal; Notable for the following:       Result Value   Glucose, Bld 106 (*)    All other components within normal limits  CBC WITH DIFFERENTIAL/PLATELET - Abnormal; Notable for the following:    RBC 2.98 (*)    Hemoglobin 7.7 (*)    HCT 26.8 (*)    MCH 25.8 (*)    MCHC 28.7 (*)    All other components within normal limits  POC OCCULT BLOOD, ED - Abnormal; Notable for the following:    Fecal Occult Bld POSITIVE (*)    All other components within normal limits  URINALYSIS, ROUTINE W REFLEX MICROSCOPIC  TROPONIN I  CBC  TSH  TYPE AND SCREEN  PREPARE RBC (CROSSMATCH)  ABO/RH    EKG  EKG Interpretation  Date/Time:  Wednesday Mar 30 2017 17:18:20 EDT Ventricular Rate:  59 PR Interval:    QRS Duration: 109 QT Interval:  443 QTC Calculation: 439 R Axis:   27 Text Interpretation:  Sinus rhythm Abnormal R-wave progression, early transition Repol abnrm, prob ischemia, anterolateral lds ST elevation, consider  inferior injury No significant change since last tracing Confirmed by Ashford Clouse  MD-J, Azrael Maddix ([id number]) on 03/30/2017 5:21:14 PM       Radiology Dg Chest 2 View  Result Date: 03/30/2017 CLINICAL DATA:  Generalized weakness EXAM: CHEST  2 VIEW COMPARISON:  08/23/2016 FINDINGS: Cardiac shadow is stable. Postoperative changes are again seen. The lungs are well aerated bilaterally. No infiltrate or effusion is seen. No acute bony abnormality is noted. IMPRESSION: No active cardiopulmonary disease. Electronically Signed   By: Inez Catalina M.D.   On: 03/30/2017 18:16    Procedures .Critical Care Performed by: Dorie Rank Authorized by: Dorie Rank   Critical care provider statement:    Critical care time (minutes):  30   Critical care was time spent personally by me on the following activities:  Discussions with consultants, evaluation of patient's response to treatment, examination of patient, ordering and performing treatments and interventions, ordering and review of laboratory studies, ordering and review of radiographic studies, pulse oximetry, re-evaluation of patient's condition, obtaining history from patient or surrogate and review of old charts   (including critical care time)  Medications Ordered in ED Medications  0.9 %  sodium chloride infusion (not administered)     Initial Impression / Assessment and Plan / ED Course  I have reviewed the triage vital signs and the nursing notes.  Pertinent labs & imaging results that were available during my care of the patient were reviewed by me and considered in my medical decision making (see chart for details).  Clinical Course as of Mar 30 2025  Wed Mar 30, 2017  2026 D/w Dr Laural Golden.  Requests clear liquids.  Will see patient in the AM  [JK]    Clinical Course User Index [JK] Dorie Rank, MD  the patient presented to the emergency room with complaints of fatigue and weakness. His hemoglobin is down to 7.7. Previously his hemoglobin was in the 11  range. His fecal occult test is positive for blood. Patient's symptoms are related to an occult GI bleed. Because of his symptomatic anemia I'll order a unit of blood. I will consult the medical service for admission and further evaluation and consult with GI on call. Final Clinical Impressions(s) / ED Diagnoses   Final diagnoses:  Gastrointestinal hemorrhage, unspecified gastrointestinal hemorrhage type  Acute blood loss anemia    New Prescriptions New Prescriptions   No medications on file     Dorie Rank, MD 03/30/17 2027

## 2017-03-30 NOTE — ED Notes (Signed)
Patient alert and oriented.  States that he came in this morning due to having decreased energy.  States that he is ambulatory and does not use any assistive devices to walk.  Patient states that he has been on eliquis for the past year, and has taken it today.

## 2017-03-30 NOTE — Progress Notes (Signed)
Floor staff nurse phoned ED for report twice. ED Nurse unavailable to give report.

## 2017-03-30 NOTE — ED Triage Notes (Signed)
Generalized weakness since yesterday. Mild sob noted, pt states this is his nromal due to his COPD. Nausea. Lightheaded with movment. Neuro check in triage neg

## 2017-03-31 ENCOUNTER — Encounter (HOSPITAL_COMMUNITY): Payer: Self-pay | Admitting: *Deleted

## 2017-03-31 ENCOUNTER — Encounter (HOSPITAL_COMMUNITY)
Admission: EM | Disposition: A | Discharge status: Home / Self Care | Payer: Self-pay | Source: Home / Self Care | Attending: Pulmonary Disease

## 2017-03-31 DIAGNOSIS — I5032 Chronic diastolic (congestive) heart failure: Secondary | ICD-10-CM | POA: Diagnosis present

## 2017-03-31 DIAGNOSIS — K31819 Angiodysplasia of stomach and duodenum without bleeding: Secondary | ICD-10-CM | POA: Diagnosis not present

## 2017-03-31 DIAGNOSIS — K922 Gastrointestinal hemorrhage, unspecified: Secondary | ICD-10-CM | POA: Diagnosis not present

## 2017-03-31 DIAGNOSIS — K317 Polyp of stomach and duodenum: Secondary | ICD-10-CM | POA: Diagnosis not present

## 2017-03-31 DIAGNOSIS — I10 Essential (primary) hypertension: Secondary | ICD-10-CM | POA: Diagnosis present

## 2017-03-31 DIAGNOSIS — E782 Mixed hyperlipidemia: Secondary | ICD-10-CM | POA: Diagnosis present

## 2017-03-31 DIAGNOSIS — D175 Benign lipomatous neoplasm of intra-abdominal organs: Secondary | ICD-10-CM | POA: Diagnosis not present

## 2017-03-31 DIAGNOSIS — E890 Postprocedural hypothyroidism: Secondary | ICD-10-CM | POA: Diagnosis present

## 2017-03-31 DIAGNOSIS — J449 Chronic obstructive pulmonary disease, unspecified: Secondary | ICD-10-CM | POA: Diagnosis present

## 2017-03-31 DIAGNOSIS — Z8249 Family history of ischemic heart disease and other diseases of the circulatory system: Secondary | ICD-10-CM | POA: Diagnosis not present

## 2017-03-31 DIAGNOSIS — Z7901 Long term (current) use of anticoagulants: Secondary | ICD-10-CM | POA: Diagnosis not present

## 2017-03-31 DIAGNOSIS — K5750 Diverticulosis of both small and large intestine without perforation or abscess without bleeding: Secondary | ICD-10-CM | POA: Diagnosis present

## 2017-03-31 DIAGNOSIS — I872 Venous insufficiency (chronic) (peripheral): Secondary | ICD-10-CM | POA: Diagnosis present

## 2017-03-31 DIAGNOSIS — Z79899 Other long term (current) drug therapy: Secondary | ICD-10-CM | POA: Diagnosis not present

## 2017-03-31 DIAGNOSIS — K259 Gastric ulcer, unspecified as acute or chronic, without hemorrhage or perforation: Secondary | ICD-10-CM | POA: Diagnosis not present

## 2017-03-31 DIAGNOSIS — K257 Chronic gastric ulcer without hemorrhage or perforation: Secondary | ICD-10-CM | POA: Diagnosis present

## 2017-03-31 DIAGNOSIS — I739 Peripheral vascular disease, unspecified: Secondary | ICD-10-CM | POA: Diagnosis present

## 2017-03-31 DIAGNOSIS — K219 Gastro-esophageal reflux disease without esophagitis: Secondary | ICD-10-CM | POA: Diagnosis present

## 2017-03-31 DIAGNOSIS — Z66 Do not resuscitate: Secondary | ICD-10-CM | POA: Diagnosis present

## 2017-03-31 DIAGNOSIS — D1779 Benign lipomatous neoplasm of other sites: Secondary | ICD-10-CM | POA: Diagnosis present

## 2017-03-31 DIAGNOSIS — Z951 Presence of aortocoronary bypass graft: Secondary | ICD-10-CM | POA: Diagnosis not present

## 2017-03-31 DIAGNOSIS — I2510 Atherosclerotic heart disease of native coronary artery without angina pectoris: Secondary | ICD-10-CM | POA: Diagnosis present

## 2017-03-31 DIAGNOSIS — Z87891 Personal history of nicotine dependence: Secondary | ICD-10-CM | POA: Diagnosis not present

## 2017-03-31 DIAGNOSIS — D500 Iron deficiency anemia secondary to blood loss (chronic): Secondary | ICD-10-CM | POA: Diagnosis not present

## 2017-03-31 DIAGNOSIS — I480 Paroxysmal atrial fibrillation: Secondary | ICD-10-CM | POA: Diagnosis present

## 2017-03-31 DIAGNOSIS — K921 Melena: Secondary | ICD-10-CM | POA: Diagnosis present

## 2017-03-31 DIAGNOSIS — K3189 Other diseases of stomach and duodenum: Secondary | ICD-10-CM | POA: Diagnosis not present

## 2017-03-31 DIAGNOSIS — D62 Acute posthemorrhagic anemia: Secondary | ICD-10-CM | POA: Diagnosis present

## 2017-03-31 DIAGNOSIS — I252 Old myocardial infarction: Secondary | ICD-10-CM | POA: Diagnosis not present

## 2017-03-31 HISTORY — PX: HOT HEMOSTASIS: SHX5433

## 2017-03-31 HISTORY — PX: ESOPHAGOGASTRODUODENOSCOPY: SHX5428

## 2017-03-31 LAB — CBC
HCT: 29.8 % — ABNORMAL LOW (ref 39.0–52.0)
HEMOGLOBIN: 8.9 g/dL — AB (ref 13.0–17.0)
MCH: 26.6 pg (ref 26.0–34.0)
MCHC: 29.9 g/dL — AB (ref 30.0–36.0)
MCV: 89.2 fL (ref 78.0–100.0)
PLATELETS: 278 10*3/uL (ref 150–400)
RBC: 3.34 MIL/uL — AB (ref 4.22–5.81)
RDW: 15.2 % (ref 11.5–15.5)
WBC: 8.2 10*3/uL (ref 4.0–10.5)

## 2017-03-31 LAB — COMPREHENSIVE METABOLIC PANEL
ALK PHOS: 56 U/L (ref 38–126)
ALT: 15 U/L — ABNORMAL LOW (ref 17–63)
ANION GAP: 8 (ref 5–15)
AST: 17 U/L (ref 15–41)
Albumin: 3.7 g/dL (ref 3.5–5.0)
BUN: 19 mg/dL (ref 6–20)
CALCIUM: 8.9 mg/dL (ref 8.9–10.3)
CO2: 23 mmol/L (ref 22–32)
CREATININE: 1 mg/dL (ref 0.61–1.24)
Chloride: 106 mmol/L (ref 101–111)
Glucose, Bld: 100 mg/dL — ABNORMAL HIGH (ref 65–99)
Potassium: 3.6 mmol/L (ref 3.5–5.1)
Sodium: 137 mmol/L (ref 135–145)
Total Bilirubin: 0.6 mg/dL (ref 0.3–1.2)
Total Protein: 6.3 g/dL — ABNORMAL LOW (ref 6.5–8.1)

## 2017-03-31 LAB — HEMOGLOBIN AND HEMATOCRIT, BLOOD
HCT: 31.4 % — ABNORMAL LOW (ref 39.0–52.0)
Hemoglobin: 9.5 g/dL — ABNORMAL LOW (ref 13.0–17.0)

## 2017-03-31 LAB — PREPARE RBC (CROSSMATCH)

## 2017-03-31 SURGERY — EGD (ESOPHAGOGASTRODUODENOSCOPY)
Anesthesia: Moderate Sedation

## 2017-03-31 MED ORDER — MEPERIDINE HCL 50 MG/ML IJ SOLN
INTRAMUSCULAR | Status: AC
  Filled 2017-03-31: qty 1

## 2017-03-31 MED ORDER — SODIUM CHLORIDE 0.9 % IV SOLN
Freq: Once | INTRAVENOUS | Status: AC
  Administered 2017-03-31: 09:00:00 via INTRAVENOUS

## 2017-03-31 MED ORDER — PANTOPRAZOLE SODIUM 40 MG PO TBEC
40.0000 mg | DELAYED_RELEASE_TABLET | Freq: Two times a day (BID) | ORAL | Status: DC
  Administered 2017-03-31 – 2017-04-01 (×2): 40 mg via ORAL
  Filled 2017-03-31 (×2): qty 1

## 2017-03-31 MED ORDER — ALBUTEROL SULFATE (2.5 MG/3ML) 0.083% IN NEBU
2.5000 mg | INHALATION_SOLUTION | Freq: Four times a day (QID) | RESPIRATORY_TRACT | Status: DC
  Administered 2017-03-31 – 2017-04-01 (×2): 2.5 mg via RESPIRATORY_TRACT
  Filled 2017-03-31 (×2): qty 3

## 2017-03-31 MED ORDER — SODIUM CHLORIDE 0.9 % IV SOLN: INTRAVENOUS | Status: DC

## 2017-03-31 MED ORDER — MEPERIDINE HCL 50 MG/ML IJ SOLN
INTRAMUSCULAR | Status: DC | PRN
  Administered 2017-03-31 (×2): 25 mg via INTRAVENOUS

## 2017-03-31 MED ORDER — STERILE WATER FOR IRRIGATION IR SOLN
Status: DC | PRN
  Administered 2017-03-31: 14:00:00

## 2017-03-31 MED ORDER — SODIUM CHLORIDE 0.9 % IV SOLN
INTRAVENOUS | Status: DC
  Administered 2017-03-31: 13:00:00 via INTRAVENOUS

## 2017-03-31 MED ORDER — MIDAZOLAM HCL 5 MG/5ML IJ SOLN
INTRAMUSCULAR | Status: AC
  Filled 2017-03-31: qty 10

## 2017-03-31 MED ORDER — LIDOCAINE VISCOUS 2 % MT SOLN
OROMUCOSAL | Status: DC | PRN
  Administered 2017-03-31: 3 mL via OROMUCOSAL

## 2017-03-31 MED ORDER — LIDOCAINE VISCOUS 2 % MT SOLN
OROMUCOSAL | Status: AC
  Filled 2017-03-31: qty 15

## 2017-03-31 MED ORDER — MIDAZOLAM HCL 5 MG/5ML IJ SOLN
INTRAMUSCULAR | Status: DC | PRN
  Administered 2017-03-31 (×2): 2 mg via INTRAVENOUS
  Administered 2017-03-31: 1 mg via INTRAVENOUS

## 2017-03-31 NOTE — Op Note (Addendum)
[facility] Patient Name: John Sampson Procedure Date: 03/31/2017 1:59 PM MRN: [id number] Date of Birth: May 28, 1936 Attending MD: Hildred Laser , MD CSN: [id number] Age: 81 Admit Type: Inpatient Procedure:                Upper GI endoscopy Indications:              Melena and anemia Providers:                Hildred Laser, MD, [place]is [place] B. Sharon Seller, RN, Aram Candela Referring MD:             Jasper Loser. Luan Pulling, MD Medicines:                Lidocaine spray, Meperidine 50 mg IV, Midazolam 5                            mg IV Complications:            No immediate complications. Estimated Blood Loss:     Estimated blood loss: none. Procedure:                Pre-Anesthesia Assessment:                           - Prior to the procedure, a History and Physical                            was performed, and patient medications and                            allergies were reviewed. The patient's tolerance of                            previous anesthesia was also reviewed. The risks                            and benefits of the procedure and the sedation                            options and risks were discussed with the patient.                            All questions were answered, and informed consent                            was obtained. Prior Anticoagulants: The patient                            last took Eliquis (apixaban) 2 days prior to the                            procedure. ASA Grade Assessment: III - A patient  with severe systemic disease. After reviewing the                            risks and benefits, the patient was deemed in                            satisfactory condition to undergo the procedure.                           After obtaining informed consent, the endoscope was                            passed under direct vision. Throughout the                            procedure, the patient's blood pressure,  pulse, and                            oxygen saturations were monitored continuously. The                            EG-[id number] ([id number]) scope was introduced through the                            mouth, and advanced to the second part of duodenum.                            The upper GI endoscopy was accomplished without                            difficulty. The patient tolerated the procedure                            well. Scope In: 2:14:07 PM Scope Out: 2:29:14 PM Total Procedure Duration: 0 hours 15 minutes 7 seconds  Findings:      The examined esophagus was normal.      The Z-line was regular and was found 42 cm from the incisors.      Two non-bleeding gastric ulcers with no stigmata of bleeding were found       in the prepyloric region of the stomach. The largest lesion was 4 mm in       largest dimension.      A healed ulcer was found in the prepyloric region of the stomach.      A few diminutive sessile polyps were found in the gastric body.      The exam of the stomach was otherwise normal.      The duodenal bulb was normal.      A small diverticulum was found in the second portion of the duodenum.      There was a small lipoma in the second portion of the duodenum.      Four small angiodysplastic lesions without bleeding were found in the       second portion of the duodenum. Coagulation for bleeding prevention       using argon plasma was successful. Impression:               -  Normal esophagus.                           - Z-line regular, 42 cm from the incisors.                           - Non-bleeding gastric ulcers with no stigmata of                            bleeding.                           - Scars in the prepyloric region of the stomach.                           - A few gastric polyps.                           - Normal duodenal bulb.                           - Duodenal diverticulum.                           - Duodenal lipoma.                           -  Four non-bleeding angiodysplastic lesions in the                            duodenum. Treated with argon plasma coagulation                            (APC).                           - No specimens collected. Moderate Sedation:      Moderate (conscious) sedation was administered by the endoscopy nurse       and supervised by the endoscopist. The following parameters were       monitored: oxygen saturation, heart rate, blood pressure, CO2       capnography and response to care. Total physician intraservice time was       18 minutes. Recommendation:           - Return patient to hospital ward for ongoing care.                           - Continue present medications.                           - Change PPI to oral route.                           - H&H in a.m.                           - Hold by mouth iron.                           -  Cardiac diet.                           - Resume Eliquis on 04/03/2017. Procedure Code(s):        --- Professional ---                           [phone], Esophagogastroduodenoscopy, flexible,                            transoral; with control of bleeding, any method                           99152, Moderate sedation services provided by the                            same physician or other qualified health care                            professional performing the diagnostic or                            therapeutic service that the sedation supports,                            requiring the presence of an independent trained                            observer to assist in the monitoring of the                            patient's level of consciousness and physiological                            status; initial 15 minutes of intraservice time,                            patient age 44 years or older Diagnosis Code(s):        --- Professional ---                           K25.9, Gastric ulcer, unspecified as acute or                            chronic,  without hemorrhage or perforation                           K31.89, Other diseases of stomach and duodenum                           K31.7, Polyp of stomach and duodenum                           D17.5, Benign lipomatous neoplasm of  intra-abdominal organs                           K31.819, Angiodysplasia of stomach and duodenum                            without bleeding                           D50.0, Iron deficiency anemia secondary to blood                            loss (chronic)                           K92.1, Melena (includes Hematochezia)                           K57.10, Diverticulosis of small intestine without                            perforation or abscess without bleeding CPT copyright 2016 [employer]. All rights reserved. The codes documented in this report are preliminary and upon coder review may  be revised to meet current compliance requirements. Hildred Laser, MD Hildred Laser, MD 03/31/2017 2:41:22 PM This report has been signed electronically. Number of Addenda: 0

## 2017-03-31 NOTE — Progress Notes (Signed)
Brief EGD note:  Few small hyperplastic appearing polyps gastric body. Two small prepyloric ulcers with clean base along with antral scarring. Small duodenal diverticulum. 4 small AV malformations without stigmata of bleed also and second part of duodenum. These were ablated with APC. Incidental finding of small submucosal duodenal lipoma.

## 2017-03-31 NOTE — Care Management Note (Signed)
Case Management Note  Patient Details  Name: John Sampson MRN: [id number] Date of Birth: [birth year]  Subjective/Objective:   Adm from home with GIB. Lives alone, ind with ADL's PTA. Has PCP, drives to appointments, reports no issues affording medications. No HH or DME PTA.                 Action/Plan: Plans to return home when discharge.    Expected Discharge Date: 04/01/2017            Expected Discharge Plan:  Home/Self Care  In-House Referral:     Discharge planning Services  CM Consult  Post Acute Care Choice:    Choice offered to:     DME Arranged:    DME Agency:     HH Arranged:    HH Agency:     Status of Service:  In process, will continue to follow  If discussed at Long Length of Stay Meetings, dates discussed:    Additional Comments:  Aricka Goldberger, Chauncey Reading, RN 03/31/2017, 9:45 AM

## 2017-03-31 NOTE — Consult Note (Signed)
Reason for Consult:GI bleed  Referring Physician:   [name] is an 81 y.o. male.  HPI: admitted thru ED yesterday. C/o tiredness, weakness. No appetite. He felt he was anemic. Hx of same. Admission hemoglobin 7.7. He has been transfused with 1 unit of PRBCs.  No change in his stools. Stools have been black. States he takes Iron.  Last colonoscopy/EGD in [place] about 15 months ago for GI bleed. He reports both were normal. Hx of atrial flutter and maintained on Eliquis. (on hold).   Underwent an EGD and Colonoscopy in 2013 for IDA revealed: Impression:  Multiple diverticula at sigmoid and a few at ascending colon. Two small polyps ablated via cold biopsy(cecum and splenic flexure). Two rectal polyps were snared. Larger one was 8 mm. Two gastric ulcers at antrum with scarring and deformity. Suspect chronic peptic ulcer disease. Biopsy: Tubular adenoma. Next colonoscopy in 5 yrs.      Givens capsule in March of 2014 was normal.  Past Medical History:  Diagnosis Date  . Atrial flutter ([place])   . COPD (chronic obstructive pulmonary disease) ([place]) 3.12.14   2D Echo, EF 50-55%  . Coronary atherosclerosis of native coronary artery    Multivessel status post CABG  . Gastric ulcer    Small - nonbleeding  . GERD (gastroesophageal reflux disease)   . Headache(784.0)   . Iron deficiency anemia    Negative Givens capsule study   . Myocardial infarction ([place])   . PAD (peripheral artery disease) ([facility])    Moderate bilateral SFA disease at angiography 01/2013  . Pituitary macroadenoma [facility])     Past Surgical History:  Procedure Laterality Date  . ABDOMINAL AORTAGRAM N/A 02/19/2013   Procedure: ABDOMINAL Maxcine Ham;  Surgeon: [name], MD;  Location: [facility] CATH LAB;  Service: Cardiovascular;  Laterality: N/A;  . CARDIAC CATHETERIZATION    . CARDIOVERSION N/A 06/16/2015   Procedure: CARDIOVERSION;  Surgeon: Satira Sark, MD;  Location: [facility] ORS;  Service: Cardiovascular;   Laterality: N/A;  . COLONOSCOPY  08/18/2012   Procedure: COLONOSCOPY;  Surgeon: [name], MD;  Location: [facility] ENDO SUITE;  Service: Endoscopy;  Laterality: N/A;  1:25  . CORONARY ARTERY BYPASS GRAFT  2003   5 grafts - details not clear  . CRANIOTOMY  12/31/2011   Procedure: CRANIOTOMY HYPOPHYSECTOMY TRANSNASAL APPROACH;  Surgeon: [name], MD;  Location: [place] NEURO ORS;  Service: Neurosurgery;  Laterality: N/A;  Transphenoidal Hypophysectomy With Fat Graft Harvest from right abdomen   . ESOPHAGOGASTRODUODENOSCOPY  08/18/2012   Procedure: ESOPHAGOGASTRODUODENOSCOPY (EGD);  Surgeon: [name], MD;  [facility] END[facility];  Service: Endoscopy;  Laterality: N/A;  . EYE SURGERY  2012  . GIVENS CAPSULE STUDY N/A 01/25/2013   Procedure: GIVENS CAPSULE STUDY;  Surgeon: [name], MD;  Location: [facility] END[facility];  Service: Endoscopy;  Laterality: N/A;  730  . LARYNGOPLASTY Left 09/14/2016   Procedure: LARYNGOPLASTY;  Surgeon: [name], MD;  Location: [place];  Service: ENT;  Laterality: Left;  . LOWER EXTREMITY ANGIOGRAM N/A 02/19/2013   Procedure: LOWER EXTREMITY ANGIOGRAM;  Surgeon: [name], MD;  Location: [facility] CATH LAB;  Service: Cardiovascular;  Laterality: N/A;  . PERIPHERAL VASCULAR CATHETERIZATION N/A 01/19/2016   Procedure: Abdominal Aortogram;  Surgeon: Conrad McClain, MD;  Location: [place] CV LAB;  Service: Cardiovascular;  Laterality: N/A;  . PERIPHERAL VASCULAR CATHETERIZATION Bilateral 01/19/2016   Procedure: Lower Extremity Angiography;  Surgeon: Conrad Havana, MD;  Location:  [place] INVASIVE CV LAB;  Service: Cardiovascular;  Laterality: Bilateral;  . PV Angiogram  02/19/13   Indications: slow healing left calf ulcer  . Stress Myocardial Perfusion  12/08/2011   Indications: Abnormal EKG, Eval of prior GABG  . TEE WITHOUT CARDIOVERSION N/A 06/16/2015   Procedure: TRANSESOPHAGEAL ECHOCARDIOGRAM (TEE);  Surgeon: Satira Sark, MD;  Location: [facility] ORS;  Service:  Cardiovascular;  Laterality: N/A;  . THYROIDECTOMY N/A 09/14/2016   Procedure: TOTAL THYROIDECTOMY;  Surgeon: [name], MD;  Location: [place];  Service: ENT;  Laterality: N/A;  . TONSILLECTOMY  Age 58    Family History  Problem Relation Age of Onset  . CAD Mother   . Heart disease Father     before age 73    Social History:  reports that he quit smoking about 15 years ago. His smoking use included Cigarettes. He started smoking about 62 years ago. He has a 135.00 pack-year smoking history. He has never used smokeless tobacco. He reports that he does not drink alcohol or use drugs.  Allergies:  Allergies  Allergen Reactions  . No Known Allergies     Medications: I have reviewed the patient's current medications.  Results for orders placed or performed during the hospital encounter of 03/30/17 (from the past 48 hour(s))  Basic metabolic panel     Status: Abnormal   Collection Time: 03/30/17  5:20 PM  Result Value Ref Range   Sodium 141 135 - 145 mmol/L   Potassium 4.2 3.5 - 5.1 mmol/L   Chloride 109 101 - 111 mmol/L   CO2 23 22 - 32 mmol/L   Glucose, Bld 106 (H) 65 - 99 mg/dL   BUN 17 6 - 20 mg/dL   Creatinine, Ser 1.02 0.61 - 1.24 mg/dL   Calcium 9.1 8.9 - 10.3 mg/dL   GFR calc non Af Amer >60 >60 mL/min   GFR calc Af Amer >60 >60 mL/min    Comment: (NOTE) The eGFR has been calculated using the CKD EPI equation. This calculation has not been validated in all clinical situations. eGFR's persistently <60 mL/min signify possible Chronic Kidney Disease.    Anion gap 9 5 - 15  CBC with Differential     Status: Abnormal   Collection Time: 03/30/17  5:20 PM  Result Value Ref Range   WBC 8.6 4.0 - 10.5 K/uL   RBC 2.98 (L) 4.22 - 5.81 MIL/uL   Hemoglobin 7.7 (L) 13.0 - 17.0 g/dL   HCT 26.8 (L) 39.0 - 52.0 %   MCV 89.9 78.0 - 100.0 fL   MCH 25.8 (L) 26.0 - 34.0 pg   MCHC 28.7 (L) 30.0 - 36.0 g/dL   RDW 15.3 11.5 - 15.5 %   Platelets 299 150 - 400 K/uL    Neutrophils Relative % 68 %   Neutro Abs 5.9 1.7 - 7.7 K/uL   Lymphocytes Relative 15 %   Lymphs Abs 1.3 0.7 - 4.0 K/uL   Monocytes Relative 11 %   Monocytes Absolute 0.9 0.1 - 1.0 K/uL   Eosinophils Relative 5 %   Eosinophils Absolute 0.4 0.0 - 0.7 K/uL   Basophils Relative 1 %   Basophils Absolute 0.0 0.0 - 0.1 K/uL  TSH     Status: Abnormal   Collection Time: 03/30/17  5:20 PM  Result Value Ref Range   TSH 0.013 (L) 0.350 - 4.500 uIU/mL    Comment: Performed by a 3rd Generation assay with a functional sensitivity of <=0.01 uIU/mL.  Troponin I     Status: None   Collection Time: 03/30/17  5:26 PM  Result Value Ref Range   Troponin I <0.03 <0.03 ng/mL  Urinalysis, Routine w reflex microscopic     Status: None   Collection Time: 03/30/17  6:30 PM  Result Value Ref Range   Color, Urine YELLOW YELLOW   APPearance CLEAR CLEAR   Specific Gravity, Urine 1.011 1.005 - 1.030   pH 6.0 5.0 - 8.0   Glucose, UA NEGATIVE NEGATIVE mg/dL   Hgb urine dipstick NEGATIVE NEGATIVE   Bilirubin Urine NEGATIVE NEGATIVE   Ketones, ur NEGATIVE NEGATIVE mg/dL   Protein, ur NEGATIVE NEGATIVE mg/dL   Nitrite NEGATIVE NEGATIVE   Leukocytes, UA NEGATIVE NEGATIVE  Type and screen [facility]     Status: None   Collection Time: 03/30/17  6:30 PM  Result Value Ref Range   ABO/RH(D) O POS    Antibody Screen NEG    Sample Expiration 04/02/2017    Unit Number [id number]    Blood Component Type RED CELLS,LR    Unit division 00    Status of Unit ISSUED,FINAL    Transfusion Status OK TO TRANSFUSE    Crossmatch Result Compatible   ABO/Rh     Status: None   Collection Time: 03/30/17  6:30 PM  Result Value Ref Range   ABO/RH(D) O POS   POC occult blood, ED     Status: Abnormal   Collection Time: 03/30/17  6:37 PM  Result Value Ref Range   Fecal Occult Bld POSITIVE (A) NEGATIVE  Prepare RBC     Status: None   Collection Time: 03/30/17  7:30 PM  Result Value Ref Range   Order Confirmation  ORDER PROCESSED BY BLOOD BANK   CBC     Status: Abnormal   Collection Time: 03/31/17  5:44 AM  Result Value Ref Range   WBC 8.2 4.0 - 10.5 K/uL   RBC 3.34 (L) 4.22 - 5.81 MIL/uL   Hemoglobin 8.9 (L) 13.0 - 17.0 g/dL   HCT 29.8 (L) 39.0 - 52.0 %   MCV 89.2 78.0 - 100.0 fL   MCH 26.6 26.0 - 34.0 pg   MCHC 29.9 (L) 30.0 - 36.0 g/dL   RDW 15.2 11.5 - 15.5 %   Platelets 278 150 - 400 K/uL  Comprehensive metabolic panel     Status: Abnormal   Collection Time: 03/31/17  5:44 AM  Result Value Ref Range   Sodium 137 135 - 145 mmol/L   Potassium 3.6 3.5 - 5.1 mmol/L   Chloride 106 101 - 111 mmol/L   CO2 23 22 - 32 mmol/L   Glucose, Bld 100 (H) 65 - 99 mg/dL   BUN 19 6 - 20 mg/dL   Creatinine, Ser 1.00 0.61 - 1.24 mg/dL   Calcium 8.9 8.9 - 10.3 mg/dL   Total Protein 6.3 (L) 6.5 - 8.1 g/dL   Albumin 3.7 3.5 - 5.0 g/dL   AST 17 15 - 41 U/L   ALT 15 (L) 17 - 63 U/L   Alkaline Phosphatase 56 38 - 126 U/L   Total Bilirubin 0.6 0.3 - 1.2 mg/dL   GFR calc non Af Amer >60 >60 mL/min   GFR calc Af Amer >60 >60 mL/min    Comment: (NOTE) The eGFR has been calculated using the CKD EPI equation. This calculation has not been validated in all clinical situations. eGFR's persistently <60 mL/min signify possible Chronic Kidney Disease.    Anion gap  8 5 - 15    Dg Chest 2 View  Result Date: 03/30/2017 CLINICAL DATA:  Generalized weakness EXAM: CHEST  2 VIEW COMPARISON:  08/23/2016 FINDINGS: Cardiac shadow is stable. Postoperative changes are again seen. The lungs are well aerated bilaterally. No infiltrate or effusion is seen. No acute bony abnormality is noted. IMPRESSION: No active cardiopulmonary disease. Electronically Signed   By: Inez Catalina M.D.   On: 03/30/2017 18:16    ROS Blood pressure (!) 136/58, pulse (!) 55, temperature 98.3 F (36.8 C), temperature source Oral, resp. rate 18, height '5\' 9"'  (1.753 m), weight 220 lb (99.8 kg), SpO2 100 %. Physical Exam Alert and oriented. Skin warm  and dry. Oral mucosa is moist.   . Sclera anicteric, conjunctivae is pink. Thyroid not enlarged. No cervical lymphadenopathy. Lungs clear. Heart regular rate and rhythm.  Abdomen is soft. Bowel sounds are positive. No hepatomegaly. No abdominal masses felt. No tenderness.  1-2 + edema to lower extremities.   .Assessment/Plan: GI bleed. Probable EGD today. I have discussed with Dr. Laural Golden.   SETZER,TERRI W 03/31/2017, 8:10 AM

## 2017-03-31 NOTE — Care Management Obs Status (Signed)
[place] NOTIFICATION   Patient Details  Name: DELYLE WEIDER MRN: [id number] Date of Birth: Apr 14, 1936   Medicare Observation Status Notification Given:  Yes    Murray Guzzetta, Chauncey Reading, RN 03/31/2017, 9:05 AM

## 2017-03-31 NOTE — Progress Notes (Signed)
Subjective: He was admitted yesterday with GI bleeding. He is on long-term anticoagulation for cardiac arrhythmias. He has coronary disease. He's not having any chest pain. He also has COPD. His breathing is about the same. He has been weak and noticed that his stools are dark but he is on iron.  Objective: Vital signs in last 24 hours: Temp:  [97.6 F (36.4 C)-98.6 F (37 C)] 98.3 F (36.8 C) (05/03 0456) Pulse Rate:  [51-60] 55 (05/03 0456) Resp:  [18-25] 18 (05/03 0456) BP: (104-136)/(38-58) 136/58 (05/03 0456) SpO2:  [93 %-100 %] 100 % (05/03 0456) Weight:  [99.8 kg (220 lb)] 99.8 kg (220 lb) (05/02 1700) Weight change:  Last BM Date: 03/29/17  Intake/Output from previous day: 05/02 0701 - 05/03 0700 In: 575 [P.O.:240; Blood:335] Out: 300 [Urine:300]  PHYSICAL EXAM General appearance: alert, cooperative and no distress Resp: clear to auscultation bilaterally Cardio: regular rate and rhythm, S1, S2 normal, no murmur, click, rub or gallop GI: soft, non-tender; bowel sounds normal; no masses,  no organomegaly Extremities: extremities normal, atraumatic, no cyanosis or edema Skin warm and dry. Still somewhat pale  Lab Results:  Results for orders placed or performed during the hospital encounter of 03/30/17 (from the past 48 hour(s))  Basic metabolic panel     Status: Abnormal   Collection Time: 03/30/17  5:20 PM  Result Value Ref Range   Sodium 141 135 - 145 mmol/Sampson   Potassium 4.2 3.5 - 5.1 mmol/Sampson   Chloride 109 101 - 111 mmol/Sampson   CO2 23 22 - 32 mmol/Sampson   Glucose, Bld 106 (H) 65 - 99 mg/dL   BUN 17 6 - 20 mg/dL   Creatinine, Ser 1.02 0.61 - 1.24 mg/dL   Calcium 9.1 8.9 - 10.3 mg/dL   GFR calc non Af Amer >60 >60 mL/min   GFR calc Af Amer >60 >60 mL/min    Comment: (NOTE) The eGFR has been calculated using the CKD EPI equation. This calculation has not been validated in all clinical situations. eGFR's persistently <60 mL/min signify possible Chronic Kidney Disease.     Anion gap 9 5 - 15  CBC with Differential     Status: Abnormal   Collection Time: 03/30/17  5:20 PM  Result Value Ref Range   WBC 8.6 4.0 - 10.5 K/uL   RBC 2.98 (Sampson) 4.22 - 5.81 MIL/uL   Hemoglobin 7.7 (Sampson) 13.0 - 17.0 g/dL   HCT 26.8 (Sampson) 39.0 - 52.0 %   MCV 89.9 78.0 - 100.0 fL   MCH 25.8 (Sampson) 26.0 - 34.0 pg   MCHC 28.7 (Sampson) 30.0 - 36.0 g/dL   RDW 15.3 11.5 - 15.5 %   Platelets 299 150 - 400 K/uL   Neutrophils Relative % 68 %   Neutro Abs 5.9 1.7 - 7.7 K/uL   Lymphocytes Relative 15 %   Lymphs Abs 1.3 0.7 - 4.0 K/uL   Monocytes Relative 11 %   Monocytes Absolute 0.9 0.1 - 1.0 K/uL   Eosinophils Relative 5 %   Eosinophils Absolute 0.4 0.0 - 0.7 K/uL   Basophils Relative 1 %   Basophils Absolute 0.0 0.0 - 0.1 K/uL  TSH     Status: Abnormal   Collection Time: 03/30/17  5:20 PM  Result Value Ref Range   TSH 0.013 (Sampson) 0.350 - 4.500 uIU/mL    Comment: Performed by a 3rd Generation assay with a functional sensitivity of <=0.01 uIU/mL.  Troponin I     Status: None  Collection Time: 03/30/17  5:26 PM  Result Value Ref Range   Troponin I <0.03 <0.03 ng/mL  Urinalysis, Routine w reflex microscopic     Status: None   Collection Time: 03/30/17  6:30 PM  Result Value Ref Range   Color, Urine YELLOW YELLOW   APPearance CLEAR CLEAR   Specific Gravity, Urine 1.011 1.005 - 1.030   pH 6.0 5.0 - 8.0   Glucose, UA NEGATIVE NEGATIVE mg/dL   Hgb urine dipstick NEGATIVE NEGATIVE   Bilirubin Urine NEGATIVE NEGATIVE   Ketones, ur NEGATIVE NEGATIVE mg/dL   Protein, ur NEGATIVE NEGATIVE mg/dL   Nitrite NEGATIVE NEGATIVE   Leukocytes, UA NEGATIVE NEGATIVE  Type and screen [facility]     Status: None   Collection Time: 03/30/17  6:30 PM  Result Value Ref Range   ABO/RH(D) O POS    Antibody Screen NEG    Sample Expiration 04/02/2017    Unit Number [id number]    Blood Component Type RED CELLS,LR    Unit division 00    Status of Unit ISSUED,FINAL    Transfusion Status OK TO  TRANSFUSE    Crossmatch Result Compatible   ABO/Rh     Status: None   Collection Time: 03/30/17  6:30 PM  Result Value Ref Range   ABO/RH(D) O POS   POC occult blood, ED     Status: Abnormal   Collection Time: 03/30/17  6:37 PM  Result Value Ref Range   Fecal Occult Bld POSITIVE (A) NEGATIVE  Prepare RBC     Status: None   Collection Time: 03/30/17  7:30 PM  Result Value Ref Range   Order Confirmation ORDER PROCESSED BY BLOOD BANK   CBC     Status: Abnormal   Collection Time: 03/31/17  5:44 AM  Result Value Ref Range   WBC 8.2 4.0 - 10.5 K/uL   RBC 3.34 (Sampson) 4.22 - 5.81 MIL/uL   Hemoglobin 8.9 (Sampson) 13.0 - 17.0 g/dL   HCT 87.5 (Sampson) 64.3 - 32.9 %   MCV 89.2 78.0 - 100.0 fL   MCH 26.6 26.0 - 34.0 pg   MCHC 29.9 (Sampson) 30.0 - 36.0 g/dL   RDW 51.8 84.1 - 66.0 %   Platelets 278 150 - 400 K/uL  Comprehensive metabolic panel     Status: Abnormal   Collection Time: 03/31/17  5:44 AM  Result Value Ref Range   Sodium 137 135 - 145 mmol/Sampson   Potassium 3.6 3.5 - 5.1 mmol/Sampson   Chloride 106 101 - 111 mmol/Sampson   CO2 23 22 - 32 mmol/Sampson   Glucose, Bld 100 (H) 65 - 99 mg/dL   BUN 19 6 - 20 mg/dL   Creatinine, Ser 6.30 0.61 - 1.24 mg/dL   Calcium 8.9 8.9 - 16.0 mg/dL   Total Protein 6.3 (Sampson) 6.5 - 8.1 g/dL   Albumin 3.7 3.5 - 5.0 g/dL   AST 17 15 - 41 U/Sampson   ALT 15 (Sampson) 17 - 63 U/Sampson   Alkaline Phosphatase 56 38 - 126 U/Sampson   Total Bilirubin 0.6 0.3 - 1.2 mg/dL   GFR calc non Af Amer >60 >60 mL/min   GFR calc Af Amer >60 >60 mL/min    Comment: (NOTE) The eGFR has been calculated using the CKD EPI equation. This calculation has not been validated in all clinical situations. eGFR's persistently <60 mL/min signify possible Chronic Kidney Disease.    Anion gap 8 5 - 15    ABGS No results  for input(s): PHART, PO2ART, TCO2, HCO3 in the last 72 hours.  Invalid input(s): PCO2 CULTURES No results found for this or any previous visit (from the past 240 hour(s)). Studies/Results: Dg Chest 2  View  Result Date: 03/30/2017 CLINICAL DATA:  Generalized weakness EXAM: CHEST  2 VIEW COMPARISON:  08/23/2016 FINDINGS: Cardiac shadow is stable. Postoperative changes are again seen. The lungs are well aerated bilaterally. No infiltrate or effusion is seen. No acute bony abnormality is noted. IMPRESSION: No active cardiopulmonary disease. Electronically Signed   By: Inez Catalina M.D.   On: 03/30/2017 18:16    Medications:  Prior to Admission:  Prescriptions Prior to Admission  Medication Sig Dispense Refill Last Dose  . acetaminophen (TYLENOL) 325 MG tablet Take 650 mg by mouth daily as needed for headache.   Past Week at Unknown time  . albuterol (PROVENTIL HFA) 108 (90 BASE) MCG/ACT inhaler Inhale 2 puffs into the lungs every 6 (six) hours as needed for wheezing or shortness of breath. 1 Inhaler 3 03/30/2017 at Unknown time  . apixaban (ELIQUIS) 5 MG TABS tablet Take 1 tablet (5 mg total) by mouth 2 (two) times daily. 60 tablet 3 03/30/2017 at 0900  . diltiazem (CARDIZEM CD) 240 MG 24 hr capsule Take 1 capsule (240 mg total) by mouth daily. 90 capsule 3 03/30/2017 at Unknown time  . ferrous sulfate 325 (65 FE) MG tablet Take 1 tablet (325 mg total) by mouth 2 (two) times daily with a meal.   03/30/2017 at Unknown time  . fish oil-omega-3 fatty acids 1000 MG capsule Take 1 g by mouth 2 (two) times daily.    03/30/2017 at Unknown time  . folic acid (FOLVITE) 1 MG tablet Take 1 mg by mouth daily.    03/30/2017 at Unknown time  . furosemide (LASIX) 20 MG tablet Take 1 tablet (20 mg total) by mouth daily. (Patient taking differently: Take 20 mg by mouth daily as needed for fluid. ) 90 tablet 3 unknown  . levothyroxine (SYNTHROID, LEVOTHROID) 150 MCG tablet Take 150 mcg by mouth daily before breakfast.   03/30/2017 at Unknown time  . lisinopril (PRINIVIL,ZESTRIL) 10 MG tablet Take 1 tablet (10 mg total) by mouth daily. 90 tablet 3 03/30/2017 at Unknown time  . metoprolol succinate (TOPROL-XL) 50 MG 24 hr tablet Take  50 mg by mouth 2 (two) times daily. Take with or immediately following a meal.   03/30/2017 at 0900  . nitroGLYCERIN (NITROSTAT) 0.4 MG SL tablet Place 1 tablet (0.4 mg total) under the tongue every 5 (five) minutes as needed for chest pain. 30 tablet 9 unknown  . omeprazole (PRILOSEC) 20 MG capsule Take 20 mg by mouth 2 (two) times daily before a meal.   03/30/2017 at Unknown time  . potassium chloride SA (K-DUR,KLOR-CON) 20 MEQ tablet Take 20 mEq by mouth daily.   03/29/2017 at Unknown time  . simvastatin (ZOCOR) 40 MG tablet Take 0.5 tablets (20 mg total) by mouth at bedtime. 30 tablet 12 03/29/2017 at Unknown time   Scheduled: . diltiazem  240 mg Oral Daily  . folic acid  1 mg Oral Daily  . levothyroxine  150 mcg Oral QAC breakfast  . lisinopril  10 mg Oral Daily  . metoprolol succinate  50 mg Oral BID  . pantoprazole (PROTONIX) IV  40 mg Intravenous Q12H  . simvastatin  20 mg Oral QHS   Continuous: . sodium chloride     LAG:[id number] **OR** acetaminophen, albuterol, ondansetron **OR** ondansetron (ZOFRAN) IV  Assesment: He has GI bleeding. The source of this is not known yet. He had acute blood loss anemia and his hemoglobin level has come up over 8 the with his coronary disease he needs to be above 10 so he needs another unit of blood. At baseline he has COPD and that seems pretty well controlled at this point. He is chronically anticoagulated because of atrial fib. He has a history of thyroid cancer. Active Problems:   Anemia   Mixed hyperlipidemia   Hx of CABG   COPD (chronic obstructive pulmonary disease) (HCC)   PAF (paroxysmal atrial fibrillation) (HCC)   Thyroid cancer (HCC)   GI bleed    Plan: GI consultation. He's going to get another unit of blood. Continue other treatments.    LOS: 0 days   John Sampson 03/31/2017, 9:02 AM

## 2017-04-01 ENCOUNTER — Encounter (HOSPITAL_COMMUNITY): Payer: Self-pay | Admitting: Internal Medicine

## 2017-04-01 LAB — TYPE AND SCREEN
ABO/RH(D): O POS
Antibody Screen: NEGATIVE
UNIT DIVISION: 0
Unit division: 0

## 2017-04-01 LAB — BPAM RBC
BLOOD PRODUCT EXPIRATION DATE: 201805242359
Blood Product Expiration Date: 201805242359
ISSUE DATE / TIME: 201805031525
ISSUE DATE / TIME: 201805022229
Unit Type and Rh: 5100
Unit Type and Rh: 5100

## 2017-04-01 LAB — CBC WITH DIFFERENTIAL/PLATELET
BASOS ABS: 0 10*3/uL (ref 0.0–0.1)
Basophils Relative: 0 %
EOS ABS: 0.5 10*3/uL (ref 0.0–0.7)
EOS PCT: 7 %
HCT: 33.8 % — ABNORMAL LOW (ref 39.0–52.0)
Hemoglobin: 10.1 g/dL — ABNORMAL LOW (ref 13.0–17.0)
LYMPHS PCT: 13 %
Lymphs Abs: 1 10*3/uL (ref 0.7–4.0)
MCH: 26.7 pg (ref 26.0–34.0)
MCHC: 29.9 g/dL — ABNORMAL LOW (ref 30.0–36.0)
MCV: 89.4 fL (ref 78.0–100.0)
Monocytes Absolute: 0.8 10*3/uL (ref 0.1–1.0)
Monocytes Relative: 10 %
Neutro Abs: 5.2 10*3/uL (ref 1.7–7.7)
Neutrophils Relative %: 70 %
PLATELETS: 264 10*3/uL (ref 150–400)
RBC: 3.78 MIL/uL — AB (ref 4.22–5.81)
RDW: 15.1 % (ref 11.5–15.5)
WBC: 7.5 10*3/uL (ref 4.0–10.5)

## 2017-04-01 MED ORDER — APIXABAN 5 MG PO TABS
5.0000 mg | ORAL_TABLET | Freq: Two times a day (BID) | ORAL | 3 refills | Status: DC
Start: 2017-04-01 — End: 2017-09-12

## 2017-04-01 NOTE — Progress Notes (Signed)
Patient ID: MYRLE DUES, male   DOB: Sep 28, 1936, 81 y.o.   MRN: [id number] States he feels good. Undergoing a breathing tx at this time. Blood pressure (!) 150/52, pulse 60, temperature 98.2 F (36.8 C), temperature source Oral, resp. rate 18, height 5\' 9"  (1.753 m), weight 220 lb (99.8 kg), SpO2 96 %. Assessment: UGIB. EGD yesterday. Impression:               - Normal esophagus.                           - Z-line regular, 42 cm from the incisors.                           - Non-bleeding gastric ulcers with no stigmata of                            bleeding.                           - Scars in the prepyloric region of the stomach.                           - A few gastric polyps.                           - Normal duodenal bulb.                           - Duodenal diverticulum.                           - Duodenal lipoma.                           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 Treated with argon plasma coagulation  May start Eliquis back in 2 days.

## 2017-04-01 NOTE — Progress Notes (Signed)
Discharge instructions gone over with patient, verbalized understanding. IV removed, patient tolerated procedure well. 

## 2017-04-01 NOTE — Discharge Summary (Signed)
Physician Discharge Summary  Patient ID: John Sampson MRN: [id number] DOB/AGE: 08-02-1936 81 y.o. Primary Care Physician:Ambreen Tufte L, MD Admit date: 03/30/2017 Discharge date: 04/01/2017    Discharge Diagnoses:   Active Problems:   Anemia   Mixed hyperlipidemia   Hx of CABG   COPD (chronic obstructive pulmonary disease) (HCC)   PAF (paroxysmal atrial fibrillation) (HCC)   Thyroid cancer (HCC)   GI bleed   GI bleeding Acute on chronic blood loss anemia Chronic gastric and duodenal ulcers  Allergies as of 04/01/2017      Reactions   No Known Allergies       Medication List    STOP taking these medications   ferrous sulfate 325 (65 FE) MG tablet     TAKE these medications   acetaminophen 325 MG tablet Commonly known as:  TYLENOL Take 650 mg by mouth daily as needed for headache.   albuterol 108 (90 Base) MCG/ACT inhaler Commonly known as:  PROVENTIL HFA Inhale 2 puffs into the lungs every 6 (six) hours as needed for wheezing or shortness of breath.   apixaban 5 MG Tabs tablet Commonly known as:  ELIQUIS Take 1 tablet (5 mg total) by mouth 2 (two) times daily. Hold until Monday then start 1 tablet twice a day as before What changed:  additional instructions   diltiazem 240 MG 24 hr capsule Commonly known as:  CARDIZEM CD Take 1 capsule (240 mg total) by mouth daily.   fish oil-omega-3 fatty acids 1000 MG capsule Take 1 g by mouth 2 (two) times daily.   folic acid 1 MG tablet Commonly known as:  FOLVITE Take 1 mg by mouth daily.   furosemide 20 MG tablet Commonly known as:  LASIX Take 1 tablet (20 mg total) by mouth daily. What changed:  when to take this  reasons to take this   levothyroxine 150 MCG tablet Commonly known as:  SYNTHROID, LEVOTHROID Take 150 mcg by mouth daily before breakfast.   lisinopril 10 MG tablet Commonly known as:  PRINIVIL,ZESTRIL Take 1 tablet (10 mg total) by mouth daily.   metoprolol succinate 50 MG 24 hr  tablet Commonly known as:  TOPROL-XL Take 50 mg by mouth 2 (two) times daily. Take with or immediately following a meal.   nitroGLYCERIN 0.4 MG SL tablet Commonly known as:  NITROSTAT Place 1 tablet (0.4 mg total) under the tongue every 5 (five) minutes as needed for chest pain.   omeprazole 20 MG capsule Commonly known as:  PRILOSEC Take 20 mg by mouth 2 (two) times daily before a meal.   potassium chloride SA 20 MEQ tablet Commonly known as:  K-DUR,KLOR-CON Take 20 mEq by mouth daily.   simvastatin 40 MG tablet Commonly known as:  ZOCOR Take 0.5 tablets (20 mg total) by mouth at bedtime.       Discharged Condition:Improved    Consults: Gastroenterology  Significant Diagnostic Studies: Dg Chest 2 View  Result Date: 03/30/2017 CLINICAL DATA:  Generalized weakness EXAM: CHEST  2 VIEW COMPARISON:  08/23/2016 FINDINGS: Cardiac shadow is stable. Postoperative changes are again seen. The lungs are well aerated bilaterally. No infiltrate or effusion is seen. No acute bony abnormality is noted. IMPRESSION: No active cardiopulmonary disease. Electronically Signed   By: Inez Catalina M.D.   On: 03/30/2017 18:16    Lab Results: Basic Metabolic Panel:  Recent Labs  03/30/17 1720 03/31/17 0544  NA 141 137  K 4.2 3.6  CL 109 106  CO2 23 23  GLUCOSE 106* 100*  BUN 17 19  CREATININE 1.02 1.00  CALCIUM 9.1 8.9   Liver Function Tests:  Recent Labs  03/31/17 0544  AST 17  ALT 15*  ALKPHOS 56  BILITOT 0.6  PROT 6.3*  ALBUMIN 3.7     CBC:  Recent Labs  03/30/17 1720 03/31/17 0544 03/31/17 1949 04/01/17 0805  WBC 8.6 8.2  --  7.5  NEUTROABS 5.9  --   --  5.2  HGB 7.7* 8.9* 9.5* 10.1*  HCT 26.8* 29.8* 31.4* 33.8*  MCV 89.9 89.2  --  89.4  PLT 299 278  --  264    No results found for this or any previous visit (from the past 240 hour(s)).   Hospital Course: This is a 81 year old who came to the emergency department because of weakness. He was found to have  anemia. He has been having some trouble with bleeding and he is on iron replacement. On admission his hemoglobin was 7.7. He was given 2 units of packed red blood cells and improved. Hemoglobin level at the time of discharge was 10.1. He had EGD and did not have a bleeding site seen. He's going to be discharged home off iron so we'll have a better idea if he has melena. He will follow-up in my office with GI. Hold his anticoagulants for 3 days.  Discharge Exam: Blood pressure (!) 150/52, pulse 60, temperature 98.2 F (36.8 C), temperature source Oral, resp. rate 18, height 5\' 9"  (1.753 m), weight 99.8 kg (220 lb), SpO2 96 %. He's awake and alert. No distress. His abdomen is soft. His heart is regular.  Disposition: Home      Signed: [place] L   04/01/2017, 9:01 AM

## 2017-04-01 NOTE — Progress Notes (Signed)
Subjective: He feels okay. No further bleeding noted. He did receive another unit of packed red blood cells yesterday and his hemoglobin level is pending this morning. He says he feels much better. No chest pain no nausea or vomiting no increased shortness of breath  Objective: Vital signs in last 24 hours: Temp:  [97.5 F (36.4 C)-98.4 F (36.9 C)] 98.2 F (36.8 C) (05/04 0602) Pulse Rate:  [56-65] 60 (05/04 0602) Resp:  [16-22] 18 (05/04 0602) BP: (107-161)/(40-57) 150/52 (05/04 0602) SpO2:  [94 %-100 %] 96 % (05/04 0750) Weight change:  Last BM Date: 03/29/17  Intake/Output from previous day: 05/03 0701 - 05/04 0700 In: 1608 [P.O.:480; I.V.:800; Blood:328] Out: 850 [Urine:850]  PHYSICAL EXAM General appearance: alert, cooperative and no distress Resp: clear to auscultation bilaterally Cardio: regular rate and rhythm, S1, S2 normal, no murmur, click, rub or gallop GI: soft, non-tender; bowel sounds normal; no masses,  no organomegaly Extremities: extremities normal, atraumatic, no cyanosis or edema Skin warm and dry  Lab Results:  Results for orders placed or performed during the hospital encounter of 03/30/17 (from the past 48 hour(s))  Basic metabolic panel     Status: Abnormal   Collection Time: 03/30/17  5:20 PM  Result Value Ref Range   Sodium 141 135 - 145 mmol/L   Potassium 4.2 3.5 - 5.1 mmol/L   Chloride 109 101 - 111 mmol/L   CO2 23 22 - 32 mmol/L   Glucose, Bld 106 (H) 65 - 99 mg/dL   BUN 17 6 - 20 mg/dL   Creatinine, Ser 1.02 0.61 - 1.24 mg/dL   Calcium 9.1 8.9 - 10.3 mg/dL   GFR calc non Af Amer >60 >60 mL/min   GFR calc Af Amer >60 >60 mL/min    Comment: (NOTE) The eGFR has been calculated using the CKD EPI equation. This calculation has not been validated in all clinical situations. eGFR's persistently <60 mL/min signify possible Chronic Kidney Disease.    Anion gap 9 5 - 15  CBC with Differential     Status: Abnormal   Collection Time: 03/30/17   5:20 PM  Result Value Ref Range   WBC 8.6 4.0 - 10.5 K/uL   RBC 2.98 (L) 4.22 - 5.81 MIL/uL   Hemoglobin 7.7 (L) 13.0 - 17.0 g/dL   HCT 26.8 (L) 39.0 - 52.0 %   MCV 89.9 78.0 - 100.0 fL   MCH 25.8 (L) 26.0 - 34.0 pg   MCHC 28.7 (L) 30.0 - 36.0 g/dL   RDW 15.3 11.5 - 15.5 %   Platelets 299 150 - 400 K/uL   Neutrophils Relative % 68 %   Neutro Abs 5.9 1.7 - 7.7 K/uL   Lymphocytes Relative 15 %   Lymphs Abs 1.3 0.7 - 4.0 K/uL   Monocytes Relative 11 %   Monocytes Absolute 0.9 0.1 - 1.0 K/uL   Eosinophils Relative 5 %   Eosinophils Absolute 0.4 0.0 - 0.7 K/uL   Basophils Relative 1 %   Basophils Absolute 0.0 0.0 - 0.1 K/uL  TSH     Status: Abnormal   Collection Time: 03/30/17  5:20 PM  Result Value Ref Range   TSH 0.013 (L) 0.350 - 4.500 uIU/mL    Comment: Performed by a 3rd Generation assay with a functional sensitivity of <=0.01 uIU/mL.  Troponin I     Status: None   Collection Time: 03/30/17  5:26 PM  Result Value Ref Range   Troponin I <0.03 <0.03 ng/mL  Urinalysis,  Routine w reflex microscopic     Status: None   Collection Time: 03/30/17  6:30 PM  Result Value Ref Range   Color, Urine YELLOW YELLOW   APPearance CLEAR CLEAR   Specific Gravity, Urine 1.011 1.005 - 1.030   pH 6.0 5.0 - 8.0   Glucose, UA NEGATIVE NEGATIVE mg/dL   Hgb urine dipstick NEGATIVE NEGATIVE   Bilirubin Urine NEGATIVE NEGATIVE   Ketones, ur NEGATIVE NEGATIVE mg/dL   Protein, ur NEGATIVE NEGATIVE mg/dL   Nitrite NEGATIVE NEGATIVE   Leukocytes, UA NEGATIVE NEGATIVE  Type and screen [facility]     Status: None (Preliminary result)   Collection Time: 03/30/17  6:30 PM  Result Value Ref Range   ABO/RH(D) O POS    Antibody Screen NEG    Sample Expiration 04/02/2017    Unit Number [id number]    Blood Component Type RED CELLS,LR    Unit division 00    Status of Unit ISSUED,FINAL    Transfusion Status OK TO TRANSFUSE    Crossmatch Result Compatible    Unit Number [id number]    Blood  Component Type RED CELLS,LR    Unit division 00    Status of Unit ISSUED    Transfusion Status OK TO TRANSFUSE    Crossmatch Result Compatible   ABO/Rh     Status: None   Collection Time: 03/30/17  6:30 PM  Result Value Ref Range   ABO/RH(D) O POS   Prepare RBC     Status: None   Collection Time: 03/30/17  6:34 PM  Result Value Ref Range   Order Confirmation ORDER PROCESSED BY BLOOD BANK   POC occult blood, ED     Status: Abnormal   Collection Time: 03/30/17  6:37 PM  Result Value Ref Range   Fecal Occult Bld POSITIVE (A) NEGATIVE  Prepare RBC     Status: None   Collection Time: 03/30/17  7:30 PM  Result Value Ref Range   Order Confirmation ORDER PROCESSED BY BLOOD BANK   CBC     Status: Abnormal   Collection Time: 03/31/17  5:44 AM  Result Value Ref Range   WBC 8.2 4.0 - 10.5 K/uL   RBC 3.34 (L) 4.22 - 5.81 MIL/uL   Hemoglobin 8.9 (L) 13.0 - 17.0 g/dL   HCT 29.8 (L) 39.0 - 52.0 %   MCV 89.2 78.0 - 100.0 fL   MCH 26.6 26.0 - 34.0 pg   MCHC 29.9 (L) 30.0 - 36.0 g/dL   RDW 15.2 11.5 - 15.5 %   Platelets 278 150 - 400 K/uL  Comprehensive metabolic panel     Status: Abnormal   Collection Time: 03/31/17  5:44 AM  Result Value Ref Range   Sodium 137 135 - 145 mmol/L   Potassium 3.6 3.5 - 5.1 mmol/L   Chloride 106 101 - 111 mmol/L   CO2 23 22 - 32 mmol/L   Glucose, Bld 100 (H) 65 - 99 mg/dL   BUN 19 6 - 20 mg/dL   Creatinine, Ser 1.00 0.61 - 1.24 mg/dL   Calcium 8.9 8.9 - 10.3 mg/dL   Total Protein 6.3 (L) 6.5 - 8.1 g/dL   Albumin 3.7 3.5 - 5.0 g/dL   AST 17 15 - 41 U/L   ALT 15 (L) 17 - 63 U/L   Alkaline Phosphatase 56 38 - 126 U/L   Total Bilirubin 0.6 0.3 - 1.2 mg/dL   GFR calc non Af Amer >60 >60 mL/min   GFR  calc Af Amer >60 >60 mL/min    Comment: (NOTE) The eGFR has been calculated using the CKD EPI equation. This calculation has not been validated in all clinical situations. eGFR's persistently <60 mL/min signify possible Chronic Kidney Disease.    Anion gap  8 5 - 15  Hemoglobin and hematocrit, blood     Status: Abnormal   Collection Time: 03/31/17  7:49 PM  Result Value Ref Range   Hemoglobin 9.5 (L) 13.0 - 17.0 g/dL   HCT 31.4 (L) 39.0 - 52.0 %  CBC with Differential/Platelet     Status: Abnormal   Collection Time: 04/01/17  8:05 AM  Result Value Ref Range   WBC 7.5 4.0 - 10.5 K/uL   RBC 3.78 (L) 4.22 - 5.81 MIL/uL   Hemoglobin 10.1 (L) 13.0 - 17.0 g/dL   HCT 33.8 (L) 39.0 - 52.0 %   MCV 89.4 78.0 - 100.0 fL   MCH 26.7 26.0 - 34.0 pg   MCHC 29.9 (L) 30.0 - 36.0 g/dL   RDW 15.1 11.5 - 15.5 %   Platelets 264 150 - 400 K/uL   Neutrophils Relative % 70 %   Neutro Abs 5.2 1.7 - 7.7 K/uL   Lymphocytes Relative 13 %   Lymphs Abs 1.0 0.7 - 4.0 K/uL   Monocytes Relative 10 %   Monocytes Absolute 0.8 0.1 - 1.0 K/uL   Eosinophils Relative 7 %   Eosinophils Absolute 0.5 0.0 - 0.7 K/uL   Basophils Relative 0 %   Basophils Absolute 0.0 0.0 - 0.1 K/uL    ABGS No results for input(s): PHART, PO2ART, TCO2, HCO3 in the last 72 hours.  Invalid input(s): PCO2 CULTURES No results found for this or any previous visit (from the past 240 hour(s)). Studies/Results: Dg Chest 2 View  Result Date: 03/30/2017 CLINICAL DATA:  Generalized weakness EXAM: CHEST  2 VIEW COMPARISON:  08/23/2016 FINDINGS: Cardiac shadow is stable. Postoperative changes are again seen. The lungs are well aerated bilaterally. No infiltrate or effusion is seen. No acute bony abnormality is noted. IMPRESSION: No active cardiopulmonary disease. Electronically Signed   By: Inez Catalina M.D.   On: 03/30/2017 18:16    Medications:  Prior to Admission:  Prescriptions Prior to Admission  Medication Sig Dispense Refill Last Dose  . acetaminophen (TYLENOL) 325 MG tablet Take 650 mg by mouth daily as needed for headache.   Past Week at Unknown time  . albuterol (PROVENTIL HFA) 108 (90 BASE) MCG/ACT inhaler Inhale 2 puffs into the lungs every 6 (six) hours as needed for wheezing or  shortness of breath. 1 Inhaler 3 03/30/2017 at Unknown time  . apixaban (ELIQUIS) 5 MG TABS tablet Take 1 tablet (5 mg total) by mouth 2 (two) times daily. 60 tablet 3 03/30/2017 at 0900  . diltiazem (CARDIZEM CD) 240 MG 24 hr capsule Take 1 capsule (240 mg total) by mouth daily. 90 capsule 3 03/30/2017 at Unknown time  . ferrous sulfate 325 (65 FE) MG tablet Take 1 tablet (325 mg total) by mouth 2 (two) times daily with a meal.   03/30/2017 at Unknown time  . fish oil-omega-3 fatty acids 1000 MG capsule Take 1 g by mouth 2 (two) times daily.    03/30/2017 at Unknown time  . folic acid (FOLVITE) 1 MG tablet Take 1 mg by mouth daily.    03/30/2017 at Unknown time  . furosemide (LASIX) 20 MG tablet Take 1 tablet (20 mg total) by mouth daily. (Patient taking differently: Take  20 mg by mouth daily as needed for fluid. ) 90 tablet 3 unknown  . levothyroxine (SYNTHROID, LEVOTHROID) 150 MCG tablet Take 150 mcg by mouth daily before breakfast.   03/30/2017 at Unknown time  . lisinopril (PRINIVIL,ZESTRIL) 10 MG tablet Take 1 tablet (10 mg total) by mouth daily. 90 tablet 3 03/30/2017 at Unknown time  . metoprolol succinate (TOPROL-XL) 50 MG 24 hr tablet Take 50 mg by mouth 2 (two) times daily. Take with or immediately following a meal.   03/30/2017 at 0900  . nitroGLYCERIN (NITROSTAT) 0.4 MG SL tablet Place 1 tablet (0.4 mg total) under the tongue every 5 (five) minutes as needed for chest pain. 30 tablet 9 unknown  . omeprazole (PRILOSEC) 20 MG capsule Take 20 mg by mouth 2 (two) times daily before a meal.   03/30/2017 at Unknown time  . potassium chloride SA (K-DUR,KLOR-CON) 20 MEQ tablet Take 20 mEq by mouth daily.   03/29/2017 at Unknown time  . simvastatin (ZOCOR) 40 MG tablet Take 0.5 tablets (20 mg total) by mouth at bedtime. 30 tablet 12 03/29/2017 at Unknown time   Scheduled: . albuterol  2.5 mg Nebulization QID  . diltiazem  240 mg Oral Daily  . folic acid  1 mg Oral Daily  . levothyroxine  150 mcg Oral QAC breakfast   . lisinopril  10 mg Oral Daily  . metoprolol succinate  50 mg Oral BID  . pantoprazole  40 mg Oral BID AC  . simvastatin  20 mg Oral QHS   Continuous:  [id number] **OR** acetaminophen, ondansetron **OR** ondansetron (ZOFRAN) IV  Assesment: He was admitted with GI bleeding. He has acute on chronic blood loss anemia. He is anticoagulated because of paroxysmal atrial fibrillation. He has coronary disease. He has COPD. He seems stable now. He did not have active bleeding on EGD. Active Problems:   Anemia   Mixed hyperlipidemia   Hx of CABG   COPD (chronic obstructive pulmonary disease) (HCC)   PAF (paroxysmal atrial fibrillation) (HCC)   Thyroid cancer (HCC)   GI bleed   GI bleeding    Plan: Check CBC. If his hemoglobin level is okay he can be discharged home today.    LOS: 1 day   [place] L 04/01/2017, 8:58 AM

## 2017-04-04 DIAGNOSIS — E1140 Type 2 diabetes mellitus with diabetic neuropathy, unspecified: Secondary | ICD-10-CM | POA: Diagnosis not present

## 2017-04-04 DIAGNOSIS — E1151 Type 2 diabetes mellitus with diabetic peripheral angiopathy without gangrene: Secondary | ICD-10-CM | POA: Diagnosis not present

## 2017-04-05 DIAGNOSIS — M79671 Pain in right foot: Secondary | ICD-10-CM | POA: Diagnosis not present

## 2017-04-05 DIAGNOSIS — M25579 Pain in unspecified ankle and joints of unspecified foot: Secondary | ICD-10-CM | POA: Diagnosis not present

## 2017-04-05 DIAGNOSIS — M79672 Pain in left foot: Secondary | ICD-10-CM | POA: Diagnosis not present

## 2017-04-11 DIAGNOSIS — I10 Essential (primary) hypertension: Secondary | ICD-10-CM | POA: Diagnosis not present

## 2017-04-11 DIAGNOSIS — C73 Malignant neoplasm of thyroid gland: Secondary | ICD-10-CM | POA: Diagnosis not present

## 2017-04-11 DIAGNOSIS — I779 Disorder of arteries and arterioles, unspecified: Secondary | ICD-10-CM | POA: Diagnosis not present

## 2017-04-11 DIAGNOSIS — J449 Chronic obstructive pulmonary disease, unspecified: Secondary | ICD-10-CM | POA: Diagnosis not present

## 2017-04-11 DIAGNOSIS — I4891 Unspecified atrial fibrillation: Secondary | ICD-10-CM | POA: Diagnosis not present

## 2017-04-11 DIAGNOSIS — I2510 Atherosclerotic heart disease of native coronary artery without angina pectoris: Secondary | ICD-10-CM | POA: Diagnosis not present

## 2017-04-11 DIAGNOSIS — D500 Iron deficiency anemia secondary to blood loss (chronic): Secondary | ICD-10-CM | POA: Diagnosis not present

## 2017-04-11 DIAGNOSIS — R609 Edema, unspecified: Secondary | ICD-10-CM | POA: Diagnosis not present

## 2017-04-11 DIAGNOSIS — S40812A Abrasion of left upper arm, initial encounter: Secondary | ICD-10-CM | POA: Diagnosis not present

## 2017-04-12 ENCOUNTER — Ambulatory Visit (HOSPITAL_COMMUNITY): Payer: Medicare Other | Attending: Pulmonary Disease | Admitting: Physical Therapy

## 2017-04-12 DIAGNOSIS — M79602 Pain in left arm: Secondary | ICD-10-CM | POA: Diagnosis not present

## 2017-04-12 DIAGNOSIS — X58XXXS Exposure to other specified factors, sequela: Secondary | ICD-10-CM | POA: Diagnosis not present

## 2017-04-12 DIAGNOSIS — S40812S Abrasion of left upper arm, sequela: Secondary | ICD-10-CM | POA: Insufficient documentation

## 2017-04-12 NOTE — Therapy (Signed)
[place], [place], [address] Phone: [phone]   Fax:  [phone]  Wound Care Evaluation  Patient Details  Name: John Sampson MRN: [id number] Date of Birth: Jan 21, 1936 Referring Provider: Sinda Du   Encounter Date: 04/12/2017      PT End of Session - 04/12/17 1253    Visit Number 1   Number of Visits 3   Date for PT Re-Evaluation 05/12/17   Authorization Type medicare   Authorization - Visit Number 1   Authorization - Number of Visits 3   PT Start Time 1120   PT Stop Time 1140   PT Time Calculation (min) 20 min   Activity Tolerance Patient tolerated treatment well   Behavior During Therapy [facility] for tasks assessed/performed      Past Medical History:  Diagnosis Date  . Atrial flutter ([place])   . Cancer ([place])    Thyroid  . COPD (chronic obstructive pulmonary disease) ([place]) 3.12.14   2D Echo, EF 50-55%  . Coronary atherosclerosis of native coronary artery    Multivessel status post CABG  . Gastric ulcer    Small - nonbleeding  . GERD (gastroesophageal reflux disease)   . Headache(784.0)   . Iron deficiency anemia    Negative Givens capsule study   . Myocardial infarction ([place])   . PAD (peripheral artery disease) ([facility])    Moderate bilateral SFA disease at angiography 01/2013  . Pituitary macroadenoma [facility])     Past Surgical History:  Procedure Laterality Date  . ABDOMINAL AORTAGRAM N/A 02/19/2013   Procedure: ABDOMINAL Maxcine Ham;  Surgeon: [name], MD;  Location: [facility] CATH LAB;  Service: Cardiovascular;  Laterality: N/A;  . CARDIAC CATHETERIZATION    . CARDIOVERSION N/A 06/16/2015   Procedure: CARDIOVERSION;  Surgeon: Satira Sark, MD;  [facility] ORS;  Service: Cardiovascular;  Laterality: N/A;  . COLONOSCOPY  08/18/2012   Procedure: COLONOSCOPY;  Surgeon: [name], MD;  [facility] ENDO SUITE;  Service: Endoscopy;  Laterality: N/A;  1:25  . CORONARY ARTERY BYPASS GRAFT   2003   5 grafts - details not clear  . CRANIOTOMY  12/31/2011   Procedure: CRANIOTOMY HYPOPHYSECTOMY TRANSNASAL APPROACH;  Surgeon: [name], MD;  Location: [place] NEURO ORS;  Service: Neurosurgery;  Laterality: N/A;  Transphenoidal Hypophysectomy With Fat Graft Harvest from right abdomen   . ESOPHAGOGASTRODUODENOSCOPY  08/18/2012   Procedure: ESOPHAGOGASTRODUODENOSCOPY (EGD);  Surgeon: [name], MD;  [facility] END[facility];  Service: Endoscopy;  Laterality: N/A;  . ESOPHAGOGASTRODUODENOSCOPY N/A 03/31/2017   Procedure: ESOPHAGOGASTRODUODENOSCOPY (EGD);  Surgeon: [name], MD;  [facility];  Service: Endoscopy;  Laterality: N/A;  . EYE SURGERY  2012  . GIVENS CAPSULE STUDY N/A 01/25/2013   Procedure: GIVENS CAPSULE STUDY;  Surgeon: [name], MD;  [facility] END[facility];  Service: Endoscopy;  Laterality: N/A;  730  . HOT HEMOSTASIS  03/31/2017   Procedure: HOT HEMOSTASIS (ARGON PLASMA COAGULATION/BICAP);  Surgeon: [name], MD;  [facility] END[facility];  Service: Endoscopy;;  duodenum  . LARYNGOPLASTY Left 09/14/2016   Procedure: LARYNGOPLASTY;  Surgeon: [name], MD;  Location: [place];  Service: ENT;  Laterality: Left;  . LOWER EXTREMITY ANGIOGRAM N/A 02/19/2013   Procedure: LOWER EXTREMITY ANGIOGRAM;  Surgeon: [name], MD;  Location: [facility] CATH LAB;  Service: Cardiovascular;  Laterality: N/A;  . PERIPHERAL VASCULAR CATHETERIZATION N/A 01/19/2016   Procedure: Abdominal Aortogram;  Surgeon: Aaron Edelman  Starlyn Skeans, MD;  Location: [place] CV LAB;  Service: Cardiovascular;  Laterality: N/A;  . PERIPHERAL VASCULAR CATHETERIZATION Bilateral 01/19/2016   Procedure: Lower Extremity Angiography;  Surgeon: Conrad Hanalei, MD;  Location: [place] CV LAB;  Service: Cardiovascular;  Laterality: Bilateral;  . PV Angiogram  02/19/13   Indications: slow healing left calf ulcer  . Stress Myocardial Perfusion  12/08/2011   Indications: Abnormal EKG, Eval of prior GABG   . TEE WITHOUT CARDIOVERSION N/A 06/16/2015   Procedure: TRANSESOPHAGEAL ECHOCARDIOGRAM (TEE);  Surgeon: Satira Sark, MD;  Location: [facility] ORS;  Service: Cardiovascular;  Laterality: N/A;  . THYROIDECTOMY N/A 09/14/2016   Procedure: TOTAL THYROIDECTOMY;  Surgeon: [name], MD;  Location: [place];  Service: ENT;  Laterality: N/A;  . THYROIDECTOMY    . TONSILLECTOMY  Age 81    There were no vitals filed for this visit.        [facility] PT Assessment - 04/12/17 0001      Assessment   Medical Diagnosis non healing wound   Referring Provider Sinda Du    Onset Date/Surgical Date 04/04/17   Hand Dominance Right   Next MD Visit not scheduled   Prior Therapy none     Precautions   Precautions Other (comment)  cellulitis     Restrictions   Weight Bearing Restrictions No     Balance Screen   Has the patient fallen in the past 6 months No   Has the patient had a decrease in activity level because of a fear of falling?  Yes   Is the patient reluctant to leave their home because of a fear of falling?  No     Home Environment   Living Environment Private residence     Prior Function   Level of Independence Independent         Wound Therapy - 04/12/17 1210    Subjective Pt states that he has a hx of COPD, anemia, CAD,CABG, with recent diagnosis of thyroid cancer.  He hit his arm on the storm door last week and the wound does not want to stop bleeding.  With his recent hospitalizatin for anemia and recent diagnosis of thyroid cancer his physican felt it necessary to refer him to skilled physical therapy for wound care to ensure that no infection occurs.    Patient and Family Stated Goals wound to heal    Date of Onset 04/04/17   Prior Treatments self care    Pain Assessment 0-10   Pain Score 2    Pain Type Acute pain   Pain Location Arm   Pain Orientation Left   Pain Descriptors / Indicators Sore   Pain Onset With Activity   Patients Stated Pain Goal 0   Pain  Intervention(s) Emotional support   Evaluation and Treatment Procedures Explained to Patient/Family Yes   Evaluation and Treatment Procedures agreed to   Wound Properties Date First Assessed: 04/12/17 Time First Assessed: 1249 Wound Type: Laceration Location: Arm Location Orientation: Left;Medial;Lateral;Posterior Wound Description (Comments): three wounds:  lateral superior 1x1.5 cm; lateral inferior 1.4x.4; medial 4.2 x 1.8;  All are superfical with 100% granulation    Dressing Type Gauze (Comment)   Dressing Changed Changed   Dressing Status Clean;Dry   Dressing Change Frequency PRN   Site / Wound Assessment Clean;Dry   % Wound base Red or Granulating 100%   Peri-wound Assessment Intact   Wound Length (cm) --  see above 3 seperate wounds  Drainage Amount Scant   Treatment Cleansed;Other (Comment)   Wound Therapy - Clinical Statement Mr. Worley is a 81 yo male recently discharged from [facility] with diagnosis of anemia.  After discharge he states he lost his voice.  After multiple scans it was determined that he had thyroid cancer and is currently under care for this diagnosis.  He hit his left arm on his storm door last week and it has not healed.  He states that the wound continues to bleed.  Due to his history of anemia and cancer his MD felt that he would benefit from skilled physcical therapy to ensure that a proper healing environment has been maintained.   Factors Delaying/Impairing Wound Healing Multiple medical problems;Polypharmacy;Vascular compromise   Hydrotherapy Plan Debridement;Dressing change;Patient/family education   Wound Therapy - Frequency --  1 time a week for 4 weeks    Wound Therapy - Current Recommendations PT   Wound Plan cleanse, debridement and dressing change .   Dressing  xeroform, 4x4, kerlix and netting                          PT Education - 04/19/17 1252    Education provided Yes   Education Details Keep dressing dry.  If dressing gets wet  redress.    Person(s) Educated Patient   Methods Explanation   Comprehension Verbalized understanding          PT Short Term Goals - 19-Apr-2017 1255      PT SHORT TERM GOAL #1   Title Wounds on left forearm to have no drainage to stop soiling of clothing.    Time 1   Period Weeks   Status New     PT SHORT TERM GOAL #2   Title PT wound to be closed to prevent any risk of infection    Time 3   Period Weeks   Status New                 Plan - 04-19-2017 1254    Clinical Impression Statement see above    Rehab Potential Good   PT Frequency 1x / week   PT Duration 3 weeks   PT Treatment/Interventions ADLs/Self Care Home Management;Other (comment)  debridement if needed and dressing change    PT Next Visit Plan Pt may be ready for discharge.    Consulted and Agree with Plan of Care Patient      Patient will benefit from skilled therapeutic intervention in order to improve the following deficits and impairments:  Pain, Other (comment) (non-healing wound )  Visit Diagnosis: Abrasion of left arm, sequela  Pain in left arm      G-Codes - 04-19-17 1259    Functional Assessment Tool Used (Outpatient Only) clinical judgement :  granulation of wound    Functional Limitation Other PT primary   Other PT Primary Current Status (T9030) At least 1 percent but less than 20 percent impaired, limited or restricted   Other PT Primary Goal Status (S9233) 0 percent impaired, limited or restricted      Problem List Patient Active Problem List   Diagnosis Date Noted  . GI bleeding 03/31/2017  . GI bleed 03/30/2017  . Thyroid cancer ([place]) 09/14/2016  . HCAP (healthcare-associated pneumonia) 08/24/2016  . Pneumonia 08/23/2016  . Chronic diastolic congestive heart failure ([place]) 07/24/2016  . Cellulitis of right arm 07/20/2016  . Cellulitis and abscess of hand 07/20/2016  . Venous stasis ulcers ([place]) 12/24/2015  .  Chronic venous insufficiency 12/24/2015  . Atherosclerosis of  extremity with ulceration ([place]) 12/24/2015  . CHF (congestive heart failure) ([place]) 12/24/2015  . Critical lower limb ischemia 12/24/2015  . Obesity 12/24/2015  . PAF (paroxysmal atrial fibrillation) ([place]) 12/24/2015  . PAD (peripheral artery disease) ([place]) 06/07/2014  . COPD (chronic obstructive pulmonary disease) ([place]) 01/23/2013  . Anemia 08/03/2012  . Essential hypertension, benign 08/03/2012  . Mixed hyperlipidemia 08/03/2012  . Hx of CABG 08/03/2012    Rayetta Humphrey, PT CLT [phone] 04/12/2017, 1:00 PM  [facility] [address] [place], [place], [address] Phone: [phone]   Fax:  [phone]  Name: DEMARQUES PILZ MRN: [id number] Date of Birth: Mar 25, 1936

## 2017-04-13 ENCOUNTER — Ambulatory Visit (HOSPITAL_COMMUNITY): Payer: Medicare Other | Admitting: Physical Therapy

## 2017-04-13 DIAGNOSIS — D509 Iron deficiency anemia, unspecified: Secondary | ICD-10-CM | POA: Diagnosis not present

## 2017-04-13 DIAGNOSIS — I10 Essential (primary) hypertension: Secondary | ICD-10-CM | POA: Diagnosis not present

## 2017-04-13 DIAGNOSIS — S40812S Abrasion of left upper arm, sequela: Secondary | ICD-10-CM

## 2017-04-13 DIAGNOSIS — M79602 Pain in left arm: Secondary | ICD-10-CM | POA: Diagnosis not present

## 2017-04-13 DIAGNOSIS — J449 Chronic obstructive pulmonary disease, unspecified: Secondary | ICD-10-CM | POA: Diagnosis not present

## 2017-04-13 DIAGNOSIS — I2510 Atherosclerotic heart disease of native coronary artery without angina pectoris: Secondary | ICD-10-CM | POA: Diagnosis not present

## 2017-04-13 NOTE — Therapy (Signed)
[facility] [address] [place], [place], [address] Phone: [phone]   Fax:  [phone]  Wound Care Therapy  Patient Details  Name: John Sampson MRN: [id number] Date of Birth: September 07, 1936 Referring Provider: Sinda Du   Encounter Date: 04/13/2017      PT End of Session - 04/13/17 0843    Visit Number 2   Number of Visits 3   Date for PT Re-Evaluation 05/12/17   Authorization Type medicare   Authorization - Visit Number 2   Authorization - Number of Visits 3   PT Start Time 0820   PT Stop Time 0840   PT Time Calculation (min) 20 min   Activity Tolerance Patient tolerated treatment well   Behavior During Therapy [facility] for tasks assessed/performed      Past Medical History:  Diagnosis Date  . Atrial flutter ([place])   . Cancer ([place])    Thyroid  . COPD (chronic obstructive pulmonary disease) ([place]) 3.12.14   2D Echo, EF 50-55%  . Coronary atherosclerosis of native coronary artery    Multivessel status post CABG  . Gastric ulcer    Small - nonbleeding  . GERD (gastroesophageal reflux disease)   . Headache(784.0)   . Iron deficiency anemia    Negative Givens capsule study   . Myocardial infarction ([place])   . PAD (peripheral artery disease) (H[facility])    Moderate bilateral SFA disease at angiography 01/2013  . Pituitary macroadenoma [facility])     Past Surgical History:  Procedure Laterality Date  . ABDOMINAL AORTAGRAM N/A 02/19/2013   Procedure: ABDOMINAL Maxcine Ham;  Surgeon: [name], MD;  Location: [facility] CATH LAB;  Service: Cardiovascular;  Laterality: N/A;  . CARDIAC CATHETERIZATION    . CARDIOVERSION N/A 06/16/2015   Procedure: CARDIOVERSION;  Surgeon: Satira Sark, MD;  Location: [facility] ORS;  Service: Cardiovascular;  Laterality: N/A;  . COLONOSCOPY  08/18/2012   Procedure: COLONOSCOPY;  Surgeon: [name], MD;  [facility];  Service: Endoscopy;  Laterality: N/A;  1:25  . CORONARY ARTERY BYPASS GRAFT   2003   5 grafts - details not clear  . CRANIOTOMY  12/31/2011   Procedure: CRANIOTOMY HYPOPHYSECTOMY TRANSNASAL APPROACH;  Surgeon: [name], MD;  Location: [place] NEURO ORS;  Service: Neurosurgery;  Laterality: N/A;  Transphenoidal Hypophysectomy With Fat Graft Harvest from right abdomen   . ESOPHAGOGASTRODUODENOSCOPY  08/18/2012   Procedure: ESOPHAGOGASTRODUODENOSCOPY (EGD);  Surgeon: [name], MD;  [facility];  Service: Endoscopy;  Laterality: N/A;  . ESOPHAGOGASTRODUODENOSCOPY N/A 03/31/2017   Procedure: ESOPHAGOGASTRODUODENOSCOPY (EGD);  Surgeon: [name], MD;  [facility];  Service: Endoscopy;  Laterality: N/A;  . EYE SURGERY  2012  . GIVENS CAPSULE STUDY N/A 01/25/2013   Procedure: GIVENS CAPSULE STUDY;  Surgeon: [name], MD;  [facility];  Service: Endoscopy;  Laterality: N/A;  730  . HOT HEMOSTASIS  03/31/2017   Procedure: HOT HEMOSTASIS (ARGON PLASMA COAGULATION/BICAP);  Surgeon: [name], MD;  [facility];  Service: Endoscopy;;  duodenum  . LARYNGOPLASTY Left 09/14/2016   Procedure: LARYNGOPLASTY;  Surgeon: [name], MD;  Location: [place];  Service: ENT;  Laterality: Left;  . LOWER EXTREMITY ANGIOGRAM N/A 02/19/2013   Procedure: LOWER EXTREMITY ANGIOGRAM;  Surgeon: [name], MD;  Location: [facility] CATH LAB;  Service: Cardiovascular;  Laterality: N/A;  . PERIPHERAL VASCULAR CATHETERIZATION N/A 01/19/2016   Procedure: Abdominal Aortogram;  Surgeon: Aaron Edelman  Starlyn Skeans, MD;  Location: [place] CV LAB;  Service: Cardiovascular;  Laterality: N/A;  . PERIPHERAL VASCULAR CATHETERIZATION Bilateral 01/19/2016   Procedure: Lower Extremity Angiography;  Surgeon: Conrad [place], MD;  Location: [place] CV LAB;  Service: Cardiovascular;  Laterality: Bilateral;  . PV Angiogram  02/19/13   Indications: slow healing left calf ulcer  . Stress Myocardial Perfusion  12/08/2011   Indications: Abnormal EKG, Eval of prior GABG   . TEE WITHOUT CARDIOVERSION N/A 06/16/2015   Procedure: TRANSESOPHAGEAL ECHOCARDIOGRAM (TEE);  Surgeon: Satira Sark, MD;  [facility];  Service: Cardiovascular;  Laterality: N/A;  . THYROIDECTOMY N/A 09/14/2016   Procedure: TOTAL THYROIDECTOMY;  Surgeon: [name], MD;  Location: [place];  Service: ENT;  Laterality: N/A;  . THYROIDECTOMY    . TONSILLECTOMY  Age 81    There were no vitals filed for this visit.                  Wound Therapy - 04/13/17 0841    Subjective Pt stopped in this morning asking to be seen secondary to dressing coming loose and bleeding through.  Pt reports no pain.   Patient and Family Stated Goals wound to heal    Date of Onset 04/04/17   Prior Treatments self care    Pain Assessment No/denies pain   Evaluation and Treatment Procedures Explained to Patient/Family Yes   Evaluation and Treatment Procedures agreed to   Wound Properties Date First Assessed: 04/12/17 Time First Assessed: 1249 Wound Type: Laceration Location: Arm Location Orientation: Left;Medial;Lateral;Posterior Wound Description (Comments): three wounds:  lateral superior 1x1.5 cm; lateral inferior 1.4x.4; medial 4.2 x 1.8;  All are superfical with 100% granulation    Dressing Type Gauze (Comment)   Dressing Changed Changed   Dressing Status Clean;Dry   Dressing Change Frequency PRN   Site / Wound Assessment Clean;Dry   % Wound base Red or Granulating 100%   Peri-wound Assessment Intact   Drainage Amount Scant   Treatment Cleansed   Wound Therapy - Clinical Statement Removed bandage with noted drainage, however only minimally and currently not draining or bleeding.  Cleansed wound and redressed with xeroform, gauze secured with medipore and wrapped with 3" conform/#5 netting.  Pt reported overall comfort.     Factors Delaying/Impairing Wound Healing Multiple medical problems;Polypharmacy;Vascular compromise   Hydrotherapy Plan Debridement;Dressing  change;Patient/family education   Wound Therapy - Frequency --  1 time a week for 4 weeks    Wound Therapy - Current Recommendations PT   Wound Plan cleanse, debridement and dressing change .   Dressing  xeroform, 4x4, kerlix and netting                  PT Education - 04/12/17 1252    Education provided Yes   Education Details Keep dressing dry.  If dressing gets wet redress.    Person(s) Educated Patient   Methods Explanation   Comprehension Verbalized understanding          PT Short Term Goals - 04/12/17 1255      PT SHORT TERM GOAL #1   Title Wounds on left forearm to have no drainage to stop soiling of clothing.    Time 1   Period Weeks   Status New     PT SHORT TERM GOAL #2   Title PT wound to be closed to prevent any risk of infection    Time 3   Period Weeks   Status New  Plan - 2017-04-27 1254    Clinical Impression Statement see above    Rehab Potential Good   PT Frequency 1x / week   PT Duration 3 weeks   PT Treatment/Interventions ADLs/Self Care Home Management;Other (comment)  debridement if needed and dressing change    PT Next Visit Plan Pt may be ready for discharge.    Consulted and Agree with Plan of Care Patient      Patient will benefit from skilled therapeutic intervention in order to improve the following deficits and impairments:     Visit Diagnosis: Abrasion of left arm, sequela  Pain in left arm      G-Codes - 04/27/2017 1259    Functional Assessment Tool Used (Outpatient Only) clinical judgement :  granulation of wound    Functional Limitation Other PT primary   Other PT Primary Current Status (M5784) At least 1 percent but less than 20 percent impaired, limited or restricted   Other PT Primary Goal Status (O9629) 0 percent impaired, limited or restricted       Problem List Patient Active Problem List   Diagnosis Date Noted  . GI bleeding 03/31/2017  . GI bleed 03/30/2017  . Thyroid cancer  ([place]) 09/14/2016  . HCAP (healthcare-associated pneumonia) 08/24/2016  . Pneumonia 08/23/2016  . Chronic diastolic congestive heart failure ([place]) 07/24/2016  . Cellulitis of right arm 07/20/2016  . Cellulitis and abscess of hand 07/20/2016  . Venous stasis ulcers ([place]) 12/24/2015  . Chronic venous insufficiency 12/24/2015  . Atherosclerosis of extremity with ulceration ([place]) 12/24/2015  . CHF (congestive heart failure) ([place]) 12/24/2015  . Critical lower limb ischemia 12/24/2015  . Obesity 12/24/2015  . PAF (paroxysmal atrial fibrillation) ([place]) 12/24/2015  . PAD (peripheral artery disease) ([place]) 06/07/2014  . COPD (chronic obstructive pulmonary disease) ([place]) 01/23/2013  . Anemia 08/03/2012  . Essential hypertension, benign 08/03/2012  . Mixed hyperlipidemia 08/03/2012  . Hx of CABG 08/03/2012    Teena Irani, PTA/CLT [phone]  04/13/2017, 8:44 AM  [facility] [address] [place], [place], [address] Phone: [phone]   Fax:  [phone]  Name: John Sampson MRN: [id number] Date of Birth: Jun 23, 1936

## 2017-04-18 ENCOUNTER — Ambulatory Visit (HOSPITAL_COMMUNITY): Payer: Medicare Other | Admitting: Physical Therapy

## 2017-04-18 DIAGNOSIS — I4891 Unspecified atrial fibrillation: Secondary | ICD-10-CM | POA: Diagnosis not present

## 2017-04-18 DIAGNOSIS — D509 Iron deficiency anemia, unspecified: Secondary | ICD-10-CM | POA: Diagnosis not present

## 2017-04-18 DIAGNOSIS — C73 Malignant neoplasm of thyroid gland: Secondary | ICD-10-CM | POA: Diagnosis not present

## 2017-04-18 DIAGNOSIS — I779 Disorder of arteries and arterioles, unspecified: Secondary | ICD-10-CM | POA: Diagnosis not present

## 2017-04-18 DIAGNOSIS — R609 Edema, unspecified: Secondary | ICD-10-CM | POA: Diagnosis not present

## 2017-04-18 DIAGNOSIS — M79602 Pain in left arm: Secondary | ICD-10-CM | POA: Diagnosis not present

## 2017-04-18 DIAGNOSIS — S40812S Abrasion of left upper arm, sequela: Secondary | ICD-10-CM

## 2017-04-18 DIAGNOSIS — J449 Chronic obstructive pulmonary disease, unspecified: Secondary | ICD-10-CM | POA: Diagnosis not present

## 2017-04-18 DIAGNOSIS — I10 Essential (primary) hypertension: Secondary | ICD-10-CM | POA: Diagnosis not present

## 2017-04-18 DIAGNOSIS — I2510 Atherosclerotic heart disease of native coronary artery without angina pectoris: Secondary | ICD-10-CM | POA: Diagnosis not present

## 2017-04-18 NOTE — Therapy (Signed)
[facility] [address] [place], [place], [address] Phone: [phone]   Fax:  [phone]  Wound Care Therapy  Patient Details  Name: John Sampson MRN: [id number] Date of Birth: [birth year] Referring Provider: Sinda Du   Encounter Date: 04/18/2017      PT End of Session - 04/18/17 0852    Visit Number 3   Number of Visits 8   Date for PT Re-Evaluation 05/12/17   Authorization Type medicare   Authorization - Visit Number 3   Authorization - Number of Visits 8   PT Start Time 0815   PT Stop Time 0852   PT Time Calculation (min) 37 min   Activity Tolerance Patient tolerated treatment well   Behavior During Therapy [facility] for tasks assessed/performed      Past Medical History:  Diagnosis Date  . Atrial flutter ([place])   . Cancer ([place])    Thyroid  . COPD (chronic obstructive pulmonary disease) ([place]) 3.12.14   2D Echo, EF 50-55%  . Coronary atherosclerosis of native coronary artery    Multivessel status post CABG  . Gastric ulcer    Small - nonbleeding  . GERD (gastroesophageal reflux disease)   . Headache(784.0)   . Iron deficiency anemia    Negative Givens capsule study   . Myocardial infarction ([place])   . PAD (peripheral artery disease) (H[facility])    Moderate bilateral SFA disease at angiography 01/2013  . Pituitary macroadenoma [facility])     Past Surgical History:  Procedure Laterality Date  . ABDOMINAL AORTAGRAM N/A 02/19/2013   Procedure: ABDOMINAL Maxcine Ham;  Surgeon: [name], MD;  Location: [facility] CATH LAB;  Service: Cardiovascular;  Laterality: N/A;  . CARDIAC CATHETERIZATION    . CARDIOVERSION N/A 06/16/2015   Procedure: CARDIOVERSION;  Surgeon: Satira Sark, MD;  Location: [facility] ORS;  Service: Cardiovascular;  Laterality: N/A;  . COLONOSCOPY  08/18/2012   Procedure: COLONOSCOPY;  Surgeon: [name], MD;  [facility] ENDO SU[facility];  Service: Endoscopy;  Laterality: N/A;  1:25  . CORONARY ARTERY BYPASS GRAFT   2003   5 grafts - details not clear  . CRANIOTOMY  12/31/2011   Procedure: CRANIOTOMY HYPOPHYSECTOMY TRANSNASAL APPROACH;  Surgeon: [name], MD;  Location: [facility] NEURO ORS;  Service: Neurosurgery;  Laterality: N/A;  Transphenoidal Hypophysectomy With Fat Graft Harvest from right abdomen   . ESOPHAGOGASTRODUODENOSCOPY  08/18/2012   Procedure: ESOPHAGOGASTRODUODENOSCOPY (EGD);  Surgeon: [name], MD;  [facility] END[facility];  Service: Endoscopy;  Laterality: N/A;  . ESOPHAGOGASTRODUODENOSCOPY N/A 03/31/2017   Procedure: ESOPHAGOGASTRODUODENOSCOPY (EGD);  Surgeon: [name], MD;  [facility];  Service: Endoscopy;  Laterality: N/A;  . EYE SURGERY  2012  . GIVENS CAPSULE STUDY N/A 01/25/2013   Procedure: GIVENS CAPSULE STUDY;  Surgeon: [name], MD;  [facility];  Service: Endoscopy;  Laterality: N/A;  730  . HOT HEMOSTASIS  03/31/2017   Procedure: HOT HEMOSTASIS (ARGON PLASMA COAGULATION/BICAP);  Surgeon: [name], MD;  [facility];  Service: Endoscopy;;  duodenum  . LARYNGOPLASTY Left 09/14/2016   Procedure: LARYNGOPLASTY;  Surgeon: [name], MD;  Location: [facility];  Service: ENT;  Laterality: Left;  . LOWER EXTREMITY ANGIOGRAM N/A 02/19/2013   Procedure: LOWER EXTREMITY ANGIOGRAM;  Surgeon: [name], MD;  Location: [facility] CATH LAB;  Service: Cardiovascular;  Laterality: N/A;  . PERIPHERAL VASCULAR CATHETERIZATION N/A 01/19/2016   Procedure: Abdominal Aortogram;  Surgeon: Aaron Edelman  Starlyn Skeans, MD;  Location: [place] [facility] CV LAB;  Service: Cardiovascular;  Laterality: N/A;  . PERIPHERAL VASCULAR CATHETERIZATION Bilateral 01/19/2016   Procedure: Lower Extremity Angiography;  Surgeon: Conrad Cedar, MD;  Location: [place] CV LAB;  Service: Cardiovascular;  Laterality: Bilateral;  . PV Angiogram  02/19/13   Indications: slow healing left calf ulcer  . Stress Myocardial Perfusion  12/08/2011   Indications: Abnormal EKG, Eval of prior GABG   . TEE WITHOUT CARDIOVERSION N/A 06/16/2015   Procedure: TRANSESOPHAGEAL ECHOCARDIOGRAM (TEE);  Surgeon: Satira Sark, MD;  Location: [facility] ORS;  Service: Cardiovascular;  Laterality: N/A;  . THYROIDECTOMY N/A 09/14/2016   Procedure: TOTAL THYROIDECTOMY;  Surgeon: [name], MD;  Location: [facility];  Service: ENT;  Laterality: N/A;  . THYROIDECTOMY    . TONSILLECTOMY  Age 81    There were no vitals filed for this visit.         [facility] PT Assessment - 04/18/17 0001      Assessment   Medical Diagnosis non healing wound   Referring Provider Sinda Du    Onset Date/Surgical Date 04/04/17   Hand Dominance Right   Next MD Visit not scheduled   Prior Therapy none     Precautions   Precautions Other (comment)  cellulitis     Restrictions   Weight Bearing Restrictions No     Balance Screen   Has the patient fallen in the past 6 months No   Has the patient had a decrease in activity level because of a fear of falling?  Yes   Is the patient reluctant to leave their home because of a fear of falling?  No     Home Environment   Living Environment Private residence     Prior Function   Level of Independence Independent                 Wound Therapy - 04/18/17 0845    Subjective Pt states that his wound continued to bleed through the bandages.  There is no pain involved.    Patient and Family Stated Goals wound to heal    Date of Onset 04/04/17   Prior Treatments self care    Pain Assessment No/denies pain   Evaluation and Treatment Procedures Explained to Patient/Family Yes   Evaluation and Treatment Procedures agreed to   Wound Properties Date First Assessed: 04/12/17 Time First Assessed: 1249 Wound Type: Laceration Location: Arm Location Orientation: Left;Medial;Lateral;Posterior Wound Description (Comments): three wounds:  lateral superior 1x1.5 cm; lateral inferior 1.4x.4; medial 4.2 x 1.8;  All are superfical with 100% granulation    Dressing Type  Gauze (Comment)   Dressing Changed Changed   Dressing Status Old drainage   Dressing Change Frequency PRN   Site / Wound Assessment Clean;Friable   % Wound base Red or Granulating 100%  after debridement.    Peri-wound Assessment Intact   Drainage Amount Moderate   Drainage Description Sanguineous   Treatment Cleansed;Debridement (Selective)   Wound Therapy - Clinical Statement Drainage has came through the bandage.  Therapist changed dressing to [place] and added coban from wrist to upper arm to attempt to control seepage of drainage.    Factors Delaying/Impairing Wound Healing Multiple medical problems;Polypharmacy;Vascular compromise   Hydrotherapy Plan Debridement;Dressing change;Patient/family education   Wound Therapy - Frequency --  2 time a week for 4 weeks    Wound Therapy - Current Recommendations PT   Wound Plan cleanse, debridement and dressing change .  Dressing  medihoney, 4x4, kling, coban and netting                  PT Education - 04/18/17 0851    Education provided Yes   Education Details If pt notices his hand swelling he is to take coban off and keep all other dressings on.    Person(s) Educated Patient   Methods Explanation   Comprehension Verbalized understanding          PT Short Term Goals - 04/18/17 0853      PT SHORT TERM GOAL #1   Title Wounds on left forearm to have no drainage to stop soiling of clothing.    Time 1   Period Weeks   Status On-going     PT SHORT TERM GOAL #2   Title PT wound to be closed to prevent any risk of infection    Time 3   Period Weeks   Status On-going                  Plan - 04/18/17 0623    Clinical Impression Statement as above    Rehab Potential Good   PT Frequency 2x / week   PT Duration 4 weeks   PT Treatment/Interventions ADLs/Self Care Home Management;Other (comment)  debridement if needed and dressing change    PT Next Visit Plan continue to see pt until wound are completely  healed.    Consulted and Agree with Plan of Care Patient      Patient will benefit from skilled therapeutic intervention in order to improve the following deficits and impairments:  Pain, Other (comment) (non-healing wound )  Visit Diagnosis: Abrasion of left arm, sequela  Pain in left arm     Problem List Patient Active Problem List   Diagnosis Date Noted  . GI bleeding 03/31/2017  . GI bleed 03/30/2017  . Thyroid cancer ([place]) 09/14/2016  . HCAP (healthcare-associated pneumonia) 08/24/2016  . Pneumonia 08/23/2016  . Chronic diastolic congestive heart failure ([place]) 07/24/2016  . Cellulitis of right arm 07/20/2016  . Cellulitis and abscess of hand 07/20/2016  . Venous stasis ulcers ([place]) 12/24/2015  . Chronic venous insufficiency 12/24/2015  . Atherosclerosis of extremity with ulceration ([place]) 12/24/2015  . CHF (congestive heart failure) ([place]) 12/24/2015  . Critical lower limb ischemia 12/24/2015  . Obesity 12/24/2015  . PAF (paroxysmal atrial fibrillation) ([place]) 12/24/2015  . PAD (peripheral artery disease) ([place]) 06/07/2014  . COPD (chronic obstructive pulmonary disease) ([place]) 01/23/2013  . Anemia 08/03/2012  . Essential hypertension, benign 08/03/2012  . Mixed hyperlipidemia 08/03/2012  . Hx of CABG 08/03/2012   Rayetta Humphrey, PT CLT [phone] 04/18/2017, 8:56 AM  [facility] [address] [place], [place], [address] Phone: [phone]   Fax:  [phone]  Name: John Sampson MRN: [id number] Date of Birth: 15-Sep-1936

## 2017-04-20 ENCOUNTER — Telehealth (HOSPITAL_COMMUNITY): Payer: Self-pay | Admitting: Physical Therapy

## 2017-04-20 NOTE — Telephone Encounter (Signed)
Pt came into clinic this morning requesting to be seen by a therapist.  Noted swelling below bandage and into Lt hand..  No therapist was available to treatment, however removed coban layer and replaced with#6 netting.  Feel coban was too tight and where it had slid down into bend of elbow was creating too much pressure to allow fluid movement.  Instructed patient to keep UE elevated and massage hand and wrist area up to promote decreased edema.  Instructed to go to call clinic or go to ED if did not improve by this afternoon.  Pt has appointment here tomorrow. Teena Irani, PTA/CLT [phone]

## 2017-04-21 ENCOUNTER — Ambulatory Visit (HOSPITAL_COMMUNITY): Payer: Medicare Other | Admitting: Physical Therapy

## 2017-04-21 DIAGNOSIS — M79602 Pain in left arm: Secondary | ICD-10-CM | POA: Diagnosis not present

## 2017-04-21 DIAGNOSIS — S40812S Abrasion of left upper arm, sequela: Secondary | ICD-10-CM

## 2017-04-21 NOTE — Therapy (Signed)
[facility] [place], [place], [address] Phone: [phone]   Fax:  [phone]  Wound Care Therapy  Patient Details  Name: John Sampson MRN: [id number] Date of Birth: 07-12-1936 Referring Provider: Sinda Du   Encounter Date: 04/21/2017      PT End of Session - 04/21/17 1512    Visit Number 4   Number of Visits 8   Date for PT Re-Evaluation 05/12/17   Authorization Type medicare   Authorization - Visit Number 4   Authorization - Number of Visits 8   PT Start Time 7494   PT Stop Time 1450   PT Time Calculation (min) 15 min   Activity Tolerance Patient tolerated treatment well   Behavior During Therapy [facility] for tasks assessed/performed      Past Medical History:  Diagnosis Date  . Atrial flutter ([place])   . Cancer ([place])    Thyroid  . COPD (chronic obstructive pulmonary disease) ([place]) 3.12.14   2D Echo, EF 50-55%  . Coronary atherosclerosis of native coronary artery    Multivessel status post CABG  . Gastric ulcer    Small - nonbleeding  . GERD (gastroesophageal reflux disease)   . Headache(784.0)   . Iron deficiency anemia    Negative Givens capsule study   . Myocardial infarction ([place])   . PAD (peripheral artery disease) (H[facility])    Moderate bilateral SFA disease at angiography 01/2013  . Pituitary macroadenoma [facility])     Past Surgical History:  Procedure Laterality Date  . ABDOMINAL AORTAGRAM N/A 02/19/2013   Procedure: ABDOMINAL Maxcine Ham;  Surgeon: [name], MD;  Location: [facility] CATH LAB;  Service: Cardiovascular;  Laterality: N/A;  . CARDIAC CATHETERIZATION    . CARDIOVERSION N/A 06/16/2015   Procedure: CARDIOVERSION;  Surgeon: Satira Sark, MD;  Location: [facility] ORS;  Service: Cardiovascular;  Laterality: N/A;  . COLONOSCOPY  08/18/2012   Procedure: COLONOSCOPY;  Surgeon: [name], MD;  [facility];  Service: Endoscopy;  Laterality: N/A;  1:25  . CORONARY ARTERY BYPASS GRAFT   2003   5 grafts - details not clear  . CRANIOTOMY  12/31/2011   Procedure: CRANIOTOMY HYPOPHYSECTOMY TRANSNASAL APPROACH;  Surgeon: [name], MD;  Location: [place] NEURO ORS;  Service: Neurosurgery;  Laterality: N/A;  Transphenoidal Hypophysectomy With Fat Graft Harvest from right abdomen   . ESOPHAGOGASTRODUODENOSCOPY  08/18/2012   Procedure: ESOPHAGOGASTRODUODENOSCOPY (EGD);  Surgeon: [name], MD;  [facility];  Service: Endoscopy;  Laterality: N/A;  . ESOPHAGOGASTRODUODENOSCOPY N/A 03/31/2017   Procedure: ESOPHAGOGASTRODUODENOSCOPY (EGD);  Surgeon: [name], MD;  [facility];  Service: Endoscopy;  Laterality: N/A;  . EYE SURGERY  2012  . GIVENS CAPSULE STUDY N/A 01/25/2013   Procedure: GIVENS CAPSULE STUDY;  Surgeon: [name], MD;  [facility];  Service: Endoscopy;  Laterality: N/A;  730  . HOT HEMOSTASIS  03/31/2017   Procedure: HOT HEMOSTASIS (ARGON PLASMA COAGULATION/BICAP);  Surgeon: [name], MD;  [facility];  Service: Endoscopy;;  duodenum  . LARYNGOPLASTY Left 09/14/2016   Procedure: LARYNGOPLASTY;  Surgeon: [name], MD;  Location: [facility];  Service: ENT;  Laterality: Left;  . LOWER EXTREMITY ANGIOGRAM N/A 02/19/2013   Procedure: LOWER EXTREMITY ANGIOGRAM;  Surgeon: [name], MD;  Location: [facility] CATH LAB;  Service: Cardiovascular;  Laterality: N/A;  . PERIPHERAL VASCULAR CATHETERIZATION N/A 01/19/2016   Procedure: Abdominal Aortogram;  Surgeon: Aaron Edelman  Starlyn Skeans, MD;  Location: [place] CV LAB;  Service: Cardiovascular;  Laterality: N/A;  . PERIPHERAL VASCULAR CATHETERIZATION Bilateral 01/19/2016   Procedure: Lower Extremity Angiography;  Surgeon: Conrad [place], MD;  Location: [place] CV LAB;  Service: Cardiovascular;  Laterality: Bilateral;  . PV Angiogram  02/19/13   Indications: slow healing left calf ulcer  . Stress Myocardial Perfusion  12/08/2011   Indications: Abnormal EKG, Eval of prior GABG   . TEE WITHOUT CARDIOVERSION N/A 06/16/2015   Procedure: TRANSESOPHAGEAL ECHOCARDIOGRAM (TEE);  Surgeon: Satira Sark, MD;  [facility]S;  Service: Cardiovascular;  Laterality: N/A;  . THYROIDECTOMY N/A 09/14/2016   Procedure: TOTAL THYROIDECTOMY;  Surgeon: [name], MD;  Location: [facility];  Service: ENT;  Laterality: N/A;  . THYROIDECTOMY    . TONSILLECTOMY  Age 24    There were no vitals filed for this visit.                  Wound Therapy - 04/21/17 1508    Subjective Pt states that his hand swelled up he does not want any coban.   Patient and Family Stated Goals wound to heal    Date of Onset 04/04/17   Prior Treatments self care    Evaluation and Treatment Procedures Explained to Patient/Family Yes   Evaluation and Treatment Procedures agreed to   Wound Properties Date First Assessed: 04/12/17 Time First Assessed: 1249 Wound Type: Laceration Location: Arm Location Orientation: Left;Medial;Lateral;Posterior Wound Description (Comments): three wounds:  lateral superior 1x1.5 cm; lateral inferior 1.4x.4; medial 4.2 x 1.8;  All are superfical with 100% granulation    Dressing Type Gauze (Comment)   Dressing Changed Changed   Dressing Status Old drainage   Dressing Change Frequency PRN   Site / Wound Assessment Clean;Friable   % Wound base Red or Granulating 100%  after debridement.    Peri-wound Assessment Intact   Drainage Amount Moderate   Drainage Description Sanguineous   Treatment Cleansed;Debridement (Selective)   Wound Therapy - Clinical Statement Slough easily debrided from wound using 4x4, no forceps needed.  Pt wound continues to bleed excessively.  Wound sizes themselves are decreasing.  Pt urged to keep elbow above heart and apply direct pressue if he notices drainage through his bandages.    Factors Delaying/Impairing Wound Healing Multiple medical problems;Polypharmacy;Vascular compromise   Hydrotherapy Plan Debridement;Dressing  change;Patient/family education   Wound Therapy - Frequency --  2 time a week for 4 weeks    Wound Therapy - Current Recommendations PT   Wound Plan self care, cleanse, debridement and dressing change .   Dressing  medihoney, 4x4,Ab pad,  kling,and netting                    PT Short Term Goals - 04/18/17 0853      PT SHORT TERM GOAL #1   Title Wounds on left forearm to have no drainage to stop soiling of clothing.    Time 1   Period Weeks   Status On-going     PT SHORT TERM GOAL #2   Title PT wound to be closed to prevent any risk of infection    Time 3   Period Weeks   Status On-going                  Plan - 04/21/17 1513    Clinical Impression Statement as above    Rehab Potential Good   PT Frequency 2x / week  PT Duration 4 weeks   PT Treatment/Interventions ADLs/Self Care Home Management;Other (comment)  debridement if needed and dressing change    PT Next Visit Plan Remeasure wounds.  continue to see pt until wound are completely healed.    Consulted and Agree with Plan of Care Patient      Patient will benefit from skilled therapeutic intervention in order to improve the following deficits and impairments:  Pain, Other (comment) (non-healing wound )  Visit Diagnosis: Abrasion of left arm, sequela  Pain in left arm     Problem List Patient Active Problem List   Diagnosis Date Noted  . GI bleeding 03/31/2017  . GI bleed 03/30/2017  . Thyroid cancer ([place]) 09/14/2016  . HCAP (healthcare-associated pneumonia) 08/24/2016  . Pneumonia 08/23/2016  . Chronic diastolic congestive heart failure ([place]) 07/24/2016  . Cellulitis of right arm 07/20/2016  . Cellulitis and abscess of hand 07/20/2016  . Venous stasis ulcers ([place]) 12/24/2015  . Chronic venous insufficiency 12/24/2015  . Atherosclerosis of extremity with ulceration ([place]) 12/24/2015  . CHF (congestive heart failure) ([place]) 12/24/2015  . Critical lower limb ischemia 12/24/2015  .  Obesity 12/24/2015  . PAF (paroxysmal atrial fibrillation) ([place]) 12/24/2015  . PAD (peripheral artery disease) ([place]) 06/07/2014  . COPD (chronic obstructive pulmonary disease) ([place]) 01/23/2013  . Anemia 08/03/2012  . Essential hypertension, benign 08/03/2012  . Mixed hyperlipidemia 08/03/2012  . Hx of CABG 08/03/2012    Rayetta Humphrey, PT CLT [phone] 04/21/2017, 3:14 PM  [facility] [address] [place], [place], [address] Phone: [phone]   Fax:  [phone]  Name: John Sampson MRN: [id number] Date of Birth: 24-May-1936

## 2017-04-22 ENCOUNTER — Telehealth (HOSPITAL_COMMUNITY): Payer: Self-pay | Admitting: Physical Therapy

## 2017-04-22 NOTE — Telephone Encounter (Signed)
Changed appt to 04-26-17

## 2017-04-26 ENCOUNTER — Ambulatory Visit (HOSPITAL_COMMUNITY): Payer: Medicare Other | Admitting: Physical Therapy

## 2017-04-26 DIAGNOSIS — S40812S Abrasion of left upper arm, sequela: Secondary | ICD-10-CM | POA: Diagnosis not present

## 2017-04-26 DIAGNOSIS — M79602 Pain in left arm: Secondary | ICD-10-CM | POA: Diagnosis not present

## 2017-04-26 NOTE — Therapy (Signed)
[place], [place], [address] Phone: [phone]   Fax:  [phone]  Wound Care Therapy  Patient Details  Name: John Sampson MRN: [id number] Date of Birth: Nov 02, 1936 Referring Provider: Sinda Du   Encounter Date: 04/26/2017      PT End of Session - 04/26/17 1213    Visit Number 5   Number of Visits 8   Date for PT Re-Evaluation 05/12/17   Authorization Type medicare   Authorization - Visit Number 5   Authorization - Number of Visits 8   PT Start Time 0950   PT Stop Time 1010   PT Time Calculation (min) 20 min   Activity Tolerance Patient tolerated treatment well   Behavior During Therapy [facility] for tasks assessed/performed      Past Medical History:  Diagnosis Date  . Atrial flutter ([place])   . Cancer ([place])    Thyroid  . COPD (chronic obstructive pulmonary disease) ([place]) 3.12.14   2D Echo, EF 50-55%  . Coronary atherosclerosis of native coronary artery    Multivessel status post CABG  . Gastric ulcer    Small - nonbleeding  . GERD (gastroesophageal reflux disease)   . Headache(784.0)   . Iron deficiency anemia    Negative Givens capsule study   . Myocardial infarction ([place])   . PAD (peripheral artery disease) (H[facility])    Moderate bilateral SFA disease at angiography 01/2013  . Pituitary macroadenoma [facility])     Past Surgical History:  Procedure Laterality Date  . ABDOMINAL AORTAGRAM N/A 02/19/2013   Procedure: ABDOMINAL Maxcine Ham;  Surgeon: [name], MD;  Location: [facility] CATH LAB;  Service: Cardiovascular;  Laterality: N/A;  . CARDIAC CATHETERIZATION    . CARDIOVERSION N/A 06/16/2015   Procedure: CARDIOVERSION;  Surgeon: Satira Sark, MD;  Location: [facility] ORS;  Service: Cardiovascular;  Laterality: N/A;  . COLONOSCOPY  08/18/2012   Procedure: COLONOSCOPY;  Surgeon: [name], MD;  Location: [facility] ENDO SUITE;  Service: Endoscopy;  Laterality: N/A;  1:25  . CORONARY ARTERY BYPASS GRAFT   2003   5 grafts - details not clear  . CRANIOTOMY  12/31/2011   Procedure: CRANIOTOMY HYPOPHYSECTOMY TRANSNASAL APPROACH;  Surgeon: [name], MD;  Location: [place] NEURO ORS;  Service: Neurosurgery;  Laterality: N/A;  Transphenoidal Hypophysectomy With Fat Graft Harvest from right abdomen   . ESOPHAGOGASTRODUODENOSCOPY  08/18/2012   Procedure: ESOPHAGOGASTRODUODENOSCOPY (EGD);  Surgeon: [name], MD;  [facility] ENDO [facility];  Service: Endoscopy;  Laterality: N/A;  . ESOPHAGOGASTRODUODENOSCOPY N/A 03/31/2017   Procedure: ESOPHAGOGASTRODUODENOSCOPY (EGD);  Surgeon: [name], MD;  [facility];  Service: Endoscopy;  Laterality: N/A;  . EYE SURGERY  2012  . GIVENS CAPSULE STUDY N/A 01/25/2013   Procedure: GIVENS CAPSULE STUDY;  Surgeon: [name], MD;  [facility]: [facility] ENDO [facility]ITE;  Service: Endoscopy;  Laterality: N/A;  730  . HOT HEMOSTASIS  03/31/2017   Procedure: HOT HEMOSTASIS (ARGON PLASMA COAGULATION/BICAP);  Surgeon: [name], MD;  [facility] ENDO [facility];  Service: Endoscopy;;  duodenum  . LARYNGOPLASTY Left 09/14/2016   Procedure: LARYNGOPLASTY;  Surgeon: [name], MD;  Location: [place];  Service: ENT;  Laterality: Left;  . LOWER EXTREMITY ANGIOGRAM N/A 02/19/2013   Procedure: LOWER EXTREMITY ANGIOGRAM;  Surgeon: [name], MD;  Location: [facility] CATH LAB;  Service: Cardiovascular;  Laterality: N/A;  . PERIPHERAL VASCULAR CATHETERIZATION N/A 01/19/2016   Procedure: Abdominal Aortogram;  Surgeon: Aaron Edelman  Starlyn Skeans, MD;  Location: [place] CV LAB;  Service: Cardiovascular;  Laterality: N/A;  . PERIPHERAL VASCULAR CATHETERIZATION Bilateral 01/19/2016   Procedure: Lower Extremity Angiography;  Surgeon: Conrad Trosky, MD;  Location: [place] CV LAB;  Service: Cardiovascular;  Laterality: Bilateral;  . PV Angiogram  02/19/13   Indications: slow healing left calf ulcer  . Stress Myocardial Perfusion  12/08/2011   Indications: Abnormal EKG, Eval of prior GABG   . TEE WITHOUT CARDIOVERSION N/A 06/16/2015   Procedure: TRANSESOPHAGEAL ECHOCARDIOGRAM (TEE);  Surgeon: Satira Sark, MD;  Location: [facility] ORS;  Service: Cardiovascular;  Laterality: N/A;  . THYROIDECTOMY N/A 09/14/2016   Procedure: TOTAL THYROIDECTOMY;  Surgeon: [name], MD;  Location: [place];  Service: ENT;  Laterality: N/A;  . THYROIDECTOMY    . TONSILLECTOMY  Age 34    There were no vitals filed for this visit.                  Wound Therapy - 04/26/17 1211    Subjective Patient arrives stating he had to embellish his dressing a little as it slid but otherwise he is good; continues to request no use of coban/compression    Patient and Family Stated Goals wound to heal    Date of Onset 04/04/17   Prior Treatments self care    Pain Score 0-No pain   Evaluation and Treatment Procedures Explained to Patient/Family Yes   Evaluation and Treatment Procedures agreed to   Wound Properties Date First Assessed: 04/12/17 Time First Assessed: 1249 Wound Type: Laceration Location: Arm Location Orientation: Left;Medial;Lateral;Posterior Wound Description (Comments): three wounds:  lateral superior 1x1.5 cm; lateral inferior 1.4x.4; medial 4.2 x 1.8;  All are superfical with 100% granulation    Dressing Type Gauze (Comment)   Dressing Changed Changed   Dressing Status Old drainage   Dressing Change Frequency PRN   % Wound base Red or Granulating 100%   Peri-wound Assessment Intact   Drainage Amount Minimal   Drainage Description Sanguineous   Treatment Cleansed;Packing (Impregnated strip);Other (Comment)  cleansed, xeroform, gauze and netting    Wound Therapy - Clinical Statement  Patient arrives in good spirits today, reporting he had to adjust/embellish his bandage a little bit as it slid down but otherwise he is doing well. Did note gauze sticking to dry skin/scabs in the area and had to take extra time to remove today due to needing to prevent additional skin tears.  Otherwise dressed wound with xeroform and gauze, education provided about apparent wound status/improvements throughout session.    Factors Delaying/Impairing Wound Healing Multiple medical problems;Polypharmacy;Vascular compromise   Hydrotherapy Plan Debridement;Dressing change;Patient/family education   Wound Therapy - Frequency Other (comment)  2x/week    Wound Therapy - Current Recommendations PT   Wound Plan self care, cleansing, dressing with xeroform/gauze    Dressing  xeroform, gauze and netting                  PT Education - 04/26/17 1213    Education provided Yes   Education Details wound status/apparent improvement throughotu session    Person(s) Educated Patient   Methods Explanation   Comprehension Verbalized understanding          PT Short Term Goals - 04/18/17 0853      PT SHORT TERM GOAL #1   Title Wounds on left forearm to have no drainage to stop soiling of clothing.    Time 1   Period Weeks   Status  On-going     PT SHORT TERM GOAL #2   Title PT wound to be closed to prevent any risk of infection    Time 3   Period Weeks   Status On-going                Patient will benefit from skilled therapeutic intervention in order to improve the following deficits and impairments:     Visit Diagnosis: Abrasion of left arm, sequela  Pain in left arm     Problem List Patient Active Problem List   Diagnosis Date Noted  . GI bleeding 03/31/2017  . GI bleed 03/30/2017  . Thyroid cancer ([place]) 09/14/2016  . HCAP (healthcare-associated pneumonia) 08/24/2016  . Pneumonia 08/23/2016  . Chronic diastolic congestive heart failure ([place]) 07/24/2016  . Cellulitis of right arm 07/20/2016  . Cellulitis and abscess of hand 07/20/2016  . Venous stasis ulcers ([place]) 12/24/2015  . Chronic venous insufficiency 12/24/2015  . Atherosclerosis of extremity with ulceration ([place]) 12/24/2015  . CHF (congestive heart failure) ([place]) 12/24/2015  . Critical  lower limb ischemia 12/24/2015  . Obesity 12/24/2015  . PAF (paroxysmal atrial fibrillation) ([place]) 12/24/2015  . PAD (peripheral artery disease) ([place]) 06/07/2014  . COPD (chronic obstructive pulmonary disease) ([place]) 01/23/2013  . Anemia 08/03/2012  . Essential hypertension, benign 08/03/2012  . Mixed hyperlipidemia 08/03/2012  . Hx of CABG 08/03/2012    Deniece Ree PT, DPT [facility] [address] [place], [place], [address] Phone: [phone]   Fax:  [phone]  Name: John Sampson MRN: [id number] Date of Birth: 1936-11-21

## 2017-04-27 ENCOUNTER — Encounter (HOSPITAL_COMMUNITY): Payer: Self-pay | Admitting: Emergency Medicine

## 2017-04-27 ENCOUNTER — Emergency Department (HOSPITAL_COMMUNITY)
Admission: EM | Admit: 2017-04-27 | Discharge: 2017-04-27 | Disposition: A | Discharge status: Home / Self Care | Payer: Medicare Other | Attending: Emergency Medicine | Admitting: Emergency Medicine

## 2017-04-27 ENCOUNTER — Emergency Department (HOSPITAL_COMMUNITY): Payer: Medicare Other

## 2017-04-27 ENCOUNTER — Ambulatory Visit (HOSPITAL_COMMUNITY): Payer: Medicare Other | Admitting: Physical Therapy

## 2017-04-27 DIAGNOSIS — Z79899 Other long term (current) drug therapy: Secondary | ICD-10-CM | POA: Diagnosis not present

## 2017-04-27 DIAGNOSIS — J449 Chronic obstructive pulmonary disease, unspecified: Secondary | ICD-10-CM | POA: Insufficient documentation

## 2017-04-27 DIAGNOSIS — R5383 Other fatigue: Secondary | ICD-10-CM | POA: Diagnosis not present

## 2017-04-27 DIAGNOSIS — I2510 Atherosclerotic heart disease of native coronary artery without angina pectoris: Secondary | ICD-10-CM | POA: Diagnosis not present

## 2017-04-27 DIAGNOSIS — I110 Hypertensive heart disease with heart failure: Secondary | ICD-10-CM | POA: Diagnosis not present

## 2017-04-27 DIAGNOSIS — Z87891 Personal history of nicotine dependence: Secondary | ICD-10-CM | POA: Insufficient documentation

## 2017-04-27 DIAGNOSIS — Z85850 Personal history of malignant neoplasm of thyroid: Secondary | ICD-10-CM | POA: Insufficient documentation

## 2017-04-27 DIAGNOSIS — R531 Weakness: Secondary | ICD-10-CM | POA: Diagnosis present

## 2017-04-27 DIAGNOSIS — I509 Heart failure, unspecified: Secondary | ICD-10-CM | POA: Insufficient documentation

## 2017-04-27 HISTORY — DX: Angiodysplasia of colon without hemorrhage: K55.20

## 2017-04-27 LAB — DIFFERENTIAL
Basophils Absolute: 0 10*3/uL (ref 0.0–0.1)
Basophils Relative: 0 %
EOS PCT: 4 %
Eosinophils Absolute: 0.3 10*3/uL (ref 0.0–0.7)
LYMPHS PCT: 12 %
Lymphs Abs: 1 10*3/uL (ref 0.7–4.0)
MONO ABS: 0.9 10*3/uL (ref 0.1–1.0)
Monocytes Relative: 11 %
Neutro Abs: 6.3 10*3/uL (ref 1.7–7.7)
Neutrophils Relative %: 73 %

## 2017-04-27 LAB — URINALYSIS, ROUTINE W REFLEX MICROSCOPIC
BILIRUBIN URINE: NEGATIVE
GLUCOSE, UA: NEGATIVE mg/dL
Hgb urine dipstick: NEGATIVE
KETONES UR: NEGATIVE mg/dL
Leukocytes, UA: NEGATIVE
Nitrite: NEGATIVE
PH: 6 (ref 5.0–8.0)
Protein, ur: NEGATIVE mg/dL
Specific Gravity, Urine: 1.008 (ref 1.005–1.030)

## 2017-04-27 LAB — CBC
HEMATOCRIT: 35.4 % — AB (ref 39.0–52.0)
Hemoglobin: 10.6 g/dL — ABNORMAL LOW (ref 13.0–17.0)
MCH: 25.8 pg — AB (ref 26.0–34.0)
MCHC: 29.9 g/dL — AB (ref 30.0–36.0)
MCV: 86.1 fL (ref 78.0–100.0)
Platelets: 298 10*3/uL (ref 150–400)
RBC: 4.11 MIL/uL — ABNORMAL LOW (ref 4.22–5.81)
RDW: 16.6 % — AB (ref 11.5–15.5)
WBC: 8.8 10*3/uL (ref 4.0–10.5)

## 2017-04-27 LAB — BASIC METABOLIC PANEL
Anion gap: 10 (ref 5–15)
BUN: 13 mg/dL (ref 6–20)
CHLORIDE: 107 mmol/L (ref 101–111)
CO2: 21 mmol/L — ABNORMAL LOW (ref 22–32)
Calcium: 9.4 mg/dL (ref 8.9–10.3)
Creatinine, Ser: 0.87 mg/dL (ref 0.61–1.24)
GFR calc Af Amer: >60 mL/min (ref >60)
GFR calc non Af Amer: >60 mL/min (ref >60)
GLUCOSE: 97 mg/dL (ref 65–99)
POTASSIUM: 3.9 mmol/L (ref 3.5–5.1)
Sodium: 138 mmol/L (ref 135–145)

## 2017-04-27 LAB — TROPONIN I

## 2017-04-27 LAB — CBG MONITORING, ED: Glucose-Capillary: 94 mg/dL (ref 65–99)

## 2017-04-27 LAB — HEPATIC FUNCTION PANEL
ALBUMIN: 4 g/dL (ref 3.5–5.0)
ALK PHOS: 71 U/L (ref 38–126)
ALT: 18 U/L (ref 17–63)
AST: 23 U/L (ref 15–41)
BILIRUBIN INDIRECT: 0.4 mg/dL (ref 0.3–0.9)
Bilirubin, Direct: 0.1 mg/dL (ref 0.1–0.5)
Total Bilirubin: 0.5 mg/dL (ref 0.3–1.2)
Total Protein: 6.8 g/dL (ref 6.5–8.1)

## 2017-04-27 NOTE — Discharge Instructions (Signed)
Take your usual prescriptions as previously directed.  Call your regular medical doctor tomorrow to schedule a follow up appointment within the next 2 days.  Return to the Emergency Department immediately sooner if worsening.  ° °

## 2017-04-27 NOTE — ED Triage Notes (Signed)
Pt reports generalized weakness since Monday, was told by Dr. Luan Pulling to be seen here.  Pt thinks his hemoglobin is low.  Pt denies pain, denies unilateral weakness. Pt alert and oriented.

## 2017-04-27 NOTE — ED Notes (Signed)
EKG given to Dr. Liu.  

## 2017-04-27 NOTE — ED Provider Notes (Signed)
[facility] DEPT Provider Note   CSN: [id number] Arrival date & time: 04/27/17  1722     History   Chief Complaint Chief Complaint  Patient presents with  . Weakness    HPI John Sampson is a 81 y.o. male.   Weakness     Pt was seen at 1750. Per pt, c/o gradual onset and worsening of persistent generalized weakness for the past 2 days. Pt states he went to his PMD's office, and was told to come to the ED for further evaluation. Pt states "the last time" he felt this way his "hemoglobin was low and I needed a transfusion." Denies abd pain, no N/V/D, no CP/SOB, no back pain, no fevers, no black or blood in stools, no focal motor weakness.    Past Medical History:  Diagnosis Date  . Atrial flutter ([place])   . AV malformation of GI tract   . Cancer ([place])    Thyroid  . COPD (chronic obstructive pulmonary disease) ([place]) 3.12.14   2D Echo, EF 50-55%  . Coronary atherosclerosis of native coronary artery    Multivessel status post CABG  . Gastric ulcer    Small - nonbleeding  . GERD (gastroesophageal reflux disease)   . Headache(784.0)   . Iron deficiency anemia    Negative Givens capsule study   . Myocardial infarction ([place])   . PAD (peripheral artery disease) ([facility])    Moderate bilateral SFA disease at angiography 01/2013  . Pituitary macroadenoma [facility])     Patient Active Problem List   Diagnosis Date Noted  . GI bleeding 03/31/2017  . GI bleed 03/30/2017  . Thyroid cancer ([place]) 09/14/2016  . HCAP (healthcare-associated pneumonia) 08/24/2016  . Pneumonia 08/23/2016  . Chronic diastolic congestive heart failure ([place]) 07/24/2016  . Cellulitis of right arm 07/20/2016  . Cellulitis and abscess of hand 07/20/2016  . Venous stasis ulcers ([place]) 12/24/2015  . Chronic venous insufficiency 12/24/2015  . Atherosclerosis of extremity with ulceration ([place]) 12/24/2015  . CHF (congestive heart failure) ([place]) 12/24/2015  . Critical lower limb ischemia 12/24/2015  . Obesity  12/24/2015  . PAF (paroxysmal atrial fibrillation) ([place]) 12/24/2015  . PAD (peripheral artery disease) ([place]) 06/07/2014  . COPD (chronic obstructive pulmonary disease) ([place]) 01/23/2013  . Anemia 08/03/2012  . Essential hypertension, benign 08/03/2012  . Mixed hyperlipidemia 08/03/2012  . Hx of CABG 08/03/2012    Past Surgical History:  Procedure Laterality Date  . ABDOMINAL AORTAGRAM N/A 02/19/2013   Procedure: ABDOMINAL Maxcine Ham;  Surgeon: [name], MD;  Location[facility] CATH LAB;  Service: Cardiovascular;  Laterality: N/A;  . CARDIAC CATHETERIZATION    . CARDIOVERSION N/A 06/16/2015   Procedure: CARDIOVERSION;  Surgeon: Satira Sark, MD;  Location: [facility] ORS;  Service: Cardiovascular;  Laterality: N/A;  . COLONOSCOPY  08/18/2012   Procedure: COLONOSCOPY;  Surgeon: [name], MD;  [facility];  Service: Endoscopy;  Laterality: N/A;  1:25  . CORONARY ARTERY BYPASS GRAFT  2003   5 grafts - details not clear  . CRANIOTOMY  12/31/2011   Procedure: CRANIOTOMY HYPOPHYSECTOMY TRANSNASAL APPROACH;  Surgeon: [name], MD;  Location: [place] [facility] NEURO ORS;  Service: Neurosurgery;  Laterality: N/A;  Transphenoidal Hypophysectomy With Fat Graft Harvest from right abdomen   . ESOPHAGOGASTRODUODENOSCOPY  08/18/2012   Procedure: ESOPHAGOGASTRODUODENOSCOPY (EGD);  Surgeon: [name], MD;  [facility];  Service: Endoscopy;  Laterality: N/A;  . ESOPHAGOGASTRODUODENOSCOPY N/A 03/31/2017   Procedure: ESOPHAGOGASTRODUODENOSCOPY (EGD);  Surgeon: Hildred Laser  U, MD;  [facility];  Service: Endoscopy;  Laterality: N/A;  . EYE SURGERY  2012  . GIVENS CAPSULE STUDY N/A 01/25/2013   Procedure: GIVENS CAPSULE STUDY;  Surgeon: [name], MD;  [facility];  Service: Endoscopy;  Laterality: N/A;  730  . HOT HEMOSTASIS  03/31/2017   Procedure: HOT HEMOSTASIS (ARGON PLASMA COAGULATION/BICAP);  Surgeon: [name], MD;  [facility];  Service:  Endoscopy;;  duodenum  . LARYNGOPLASTY Left 09/14/2016   Procedure: LARYNGOPLASTY;  Surgeon: [name], MD;  Location: [place];  Service: ENT;  Laterality: Left;  . LOWER EXTREMITY ANGIOGRAM N/A 02/19/2013   Procedure: LOWER EXTREMITY ANGIOGRAM;  Surgeon: [name], MD;  Location[facility] CATH LAB;  Service: Cardiovascular;  Laterality: N/A;  . PERIPHERAL VASCULAR CATHETERIZATION N/A 01/19/2016   Procedure: Abdominal Aortogram;  Surgeon: Conrad O[place]ovis, MD;  Location: [place] CV LAB;  Service: Cardiovascular;  Laterality: N/A;  . PERIPHERAL VASCULAR CATHETERIZATION Bilateral 01/19/2016   Procedure: Lower Extremity Angiography;  Surgeon: Conrad , MD;  Location: [place] CV LAB;  Service: Cardiovascular;  Laterality: Bilateral;  . PV Angiogram  02/19/13   Indications: slow healing left calf ulcer  . Stress Myocardial Perfusion  12/08/2011   Indications: Abnormal EKG, Eval of prior GABG  . TEE WITHOUT CARDIOVERSION N/A 06/16/2015   Procedure: TRANSESOPHAGEAL ECHOCARDIOGRAM (TEE);  Surgeon: Satira Sark, MD;  Location: [facility] ORS;  Service: Cardiovascular;  Laterality: N/A;  . THYROIDECTOMY N/A 09/14/2016   Procedure: TOTAL THYROIDECTOMY;  Surgeon: [name], MD;  Location: [facility];  Service: ENT;  Laterality: N/A;  . THYROIDECTOMY    . TONSILLECTOMY  Age 62       Home Medications    Prior to Admission medications   Medication Sig Start Date End Date Taking? Authorizing Provider  acetaminophen (TYLENOL) 325 MG tablet Take 650 mg by mouth daily as needed for headache.    [provider]  albuterol (PROVENTIL HFA) 108 (90 BASE) MCG/ACT inhaler Inhale 2 puffs into the lungs every 6 (six) hours as needed for wheezing or shortness of breath. 11/15/14   Satira Sark, MD  apixaban (ELIQUIS) 5 MG TABS tablet Take 1 tablet (5 mg total) by mouth 2 (two) times daily. Hold until Monday then start 1 tablet twice a day as before 04/01/17   Sinda Du, MD    diltiazem (CARDIZEM CD) 240 MG 24 hr capsule Take 1 capsule (240 mg total) by mouth daily. 05/30/15   Lendon Colonel, NP  fish oil-omega-3 fatty acids 1000 MG capsule Take 1 g by mouth 2 (two) times daily.     [provider]  folic acid (FOLVITE) 1 MG tablet Take 1 mg by mouth daily.     [provider]  furosemide (LASIX) 20 MG tablet Take 1 tablet (20 mg total) by mouth daily. Patient taking differently: Take 20 mg by mouth daily as needed for fluid.  03/01/16   Satira Sark, MD  levothyroxine (SYNTHROID, LEVOTHROID) 150 MCG tablet Take 150 mcg by mouth daily before breakfast.    [provider]  lisinopril (PRINIVIL,ZESTRIL) 10 MG tablet Take 1 tablet (10 mg total) by mouth daily. 05/30/15   Lendon Colonel, NP  metoprolol succinate (TOPROL-XL) 50 MG 24 hr tablet Take 50 mg by mouth 2 (two) times daily. Take with or immediately following a meal.    [provider]  nitroGLYCERIN (NITROSTAT) 0.4 MG SL  tablet Place 1 tablet (0.4 mg total) under the tongue every 5 (five) minutes as needed for chest pain. 06/07/14   Satira Sark, MD  omeprazole (PRILOSEC) 20 MG capsule Take 20 mg by mouth 2 (two) times daily before a meal.    [provider]  potassium chloride SA (K-DUR,KLOR-CON) 20 MEQ tablet Take 20 mEq by mouth daily.    [provider]  simvastatin (ZOCOR) 40 MG tablet Take 0.5 tablets (20 mg total) by mouth at bedtime. 07/25/16   Sinda Du, MD    Family History Family History  Problem Relation Age of Onset  . CAD Mother   . Heart disease Father        before age 3    Social History Social History  Substance Use Topics  . Smoking status: Former Smoker    Packs/day: 3.00    Years: 45.00    Types: Cigarettes    Start date: 01/24/1955    Quit date: 01/23/2002  . Smokeless tobacco: Never Used     Comment: Quit 11 yrs ago  . Alcohol use No     Allergies   No known allergies   Review of Systems Review  of Systems  Neurological: Positive for weakness.  ROS: Statement: All systems negative except as marked or noted in the HPI; Constitutional: Negative for fever and chills. ; ; Eyes: Negative for eye pain, redness and discharge. ; ; ENMT: Negative for ear pain, hoarseness, nasal congestion, sinus pressure and sore throat. ; ; Cardiovascular: Negative for chest pain, palpitations, diaphoresis, dyspnea and peripheral edema. ; ; Respiratory: Negative for cough, wheezing and stridor. ; ; Gastrointestinal: Negative for nausea, vomiting, diarrhea, abdominal pain, blood in stool, hematemesis, jaundice and rectal bleeding. . ; ; Genitourinary: Negative for dysuria, flank pain and hematuria. ; ; Musculoskeletal: Negative for back pain and neck pain. Negative for swelling and trauma.; ; Skin: Negative for pruritus, rash, abrasions, blisters, bruising and skin lesion.; ; Neuro: +generalized weakness. Negative for headache, lightheadedness and neck stiffness. Negative for altered level of consciousness, altered mental status, extremity weakness, paresthesias, involuntary movement, seizure and syncope.       Physical Exam Updated Vital Signs BP (!) 118/91   Pulse (!) 52   Temp 98.3 F (36.8 C) (Oral)   Resp 18   Ht 5\' 9"  (1.753 m)   Wt 95.3 kg (210 lb)   SpO2 99%   BMI 31.01 kg/m    18:32:42 Orthostatic Vital Signs BP  Orthostatic Lying   BP- Lying: 145/51  Pulse- Lying: 68      Orthostatic Sitting  BP- Sitting: 152/51  Pulse- Sitting: 69      Orthostatic Standing at 0 minutes  BP- Standing at 0 minutes: 149/53  Pulse- Standing at 0 minutes: 71     Physical Exam 0947: Physical examination:  Nursing notes reviewed; Vital signs and O2 SAT reviewed;  Constitutional: Well developed, Well nourished, Well hydrated, In no acute distress; Head:  Normocephalic, atraumatic; Eyes: EOMI, PERRL, No scleral icterus; ENMT: Mouth and pharynx normal, Mucous membranes moist; Neck: Supple, Full range of  motion, No lymphadenopathy; Cardiovascular: Regular rate and rhythm, No gallop; Respiratory: Breath sounds clear & equal bilaterally, No wheezes.  Speaking full sentences with ease, Normal respiratory effort/excursion; Chest: Nontender, Movement normal; Abdomen: Soft, Nontender, Nondistended, Normal bowel sounds; Genitourinary: No CVA tenderness; Extremities: Pulses normal, No tenderness, No edema, No calf edema or asymmetry.; Neuro: AA&Ox3, Major CN grossly intact. No facial droop. Speech clear. No gross  focal motor or sensory deficits in extremities.; Skin: Color normal, Warm, Dry.    ED Treatments / Results  Labs (all labs ordered are listed, but only abnormal results are displayed)   EKG  EKG Interpretation  Date/Time:  Wednesday Apr 27 2017 17:31:21 EDT Ventricular Rate:  69 PR Interval:  172 QRS Duration: 102 QT Interval:  396 QTC Calculation: 424 R Axis:   1 Text Interpretation:  Sinus rhythm with occasional Premature ventricular complexes Left ventricular hypertrophy with repolarization abnormality When compared with ECG of 03/30/2017 No significant change was found Confirmed by [facility]  MD, Nunzio Cory [phone]) on 04/27/2017 6:03:54 PM       Radiology   Procedures Procedures (including critical care time)  Medications Ordered in ED Medications - No data to display   Initial Impression / Assessment and Plan / ED Course  I have reviewed the triage vital signs and the nursing notes.  Pertinent labs & imaging results that were available during my care of the patient were reviewed by me and considered in my medical decision making (see chart for details).  MDM Reviewed: previous chart, vitals and nursing note Reviewed previous: labs and ECG Interpretation: labs, ECG and x-ray   Results for orders placed or performed during the hospital encounter of 53/61/44  Basic metabolic panel  Result Value Ref Range   Sodium 138 135 - 145 mmol/L   Potassium 3.9 3.5 - 5.1 mmol/L    Chloride 107 101 - 111 mmol/L   CO2 21 (L) 22 - 32 mmol/L   Glucose, Bld 97 65 - 99 mg/dL   BUN 13 6 - 20 mg/dL   Creatinine, Ser 0.87 0.61 - 1.24 mg/dL   Calcium 9.4 8.9 - 10.3 mg/dL   GFR calc non Af Amer >60 >60 mL/min   GFR calc Af Amer >60 >60 mL/min   Anion gap 10 5 - 15  CBC  Result Value Ref Range   WBC 8.8 4.0 - 10.5 K/uL   RBC 4.11 (L) 4.22 - 5.81 MIL/uL   Hemoglobin 10.6 (L) 13.0 - 17.0 g/dL   HCT 35.4 (L) 39.0 - 52.0 %   MCV 86.1 78.0 - 100.0 fL   MCH 25.8 (L) 26.0 - 34.0 pg   MCHC 29.9 (L) 30.0 - 36.0 g/dL   RDW 16.6 (H) 11.5 - 15.5 %   Platelets 298 150 - 400 K/uL  Urinalysis, Routine w reflex microscopic  Result Value Ref Range   Color, Urine YELLOW YELLOW   APPearance CLEAR CLEAR   Specific Gravity, Urine 1.008 1.005 - 1.030   pH 6.0 5.0 - 8.0   Glucose, UA NEGATIVE NEGATIVE mg/dL   Hgb urine dipstick NEGATIVE NEGATIVE   Bilirubin Urine NEGATIVE NEGATIVE   Ketones, ur NEGATIVE NEGATIVE mg/dL   Protein, ur NEGATIVE NEGATIVE mg/dL   Nitrite NEGATIVE NEGATIVE   Leukocytes, UA NEGATIVE NEGATIVE  Hepatic function panel  Result Value Ref Range   Total Protein 6.8 6.5 - 8.1 g/dL   Albumin 4.0 3.5 - 5.0 g/dL   AST 23 15 - 41 U/L   ALT 18 17 - 63 U/L   Alkaline Phosphatase 71 38 - 126 U/L   Total Bilirubin 0.5 0.3 - 1.2 mg/dL   Bilirubin, Direct 0.1 0.1 - 0.5 mg/dL   Indirect Bilirubin 0.4 0.3 - 0.9 mg/dL  Troponin I  Result Value Ref Range   Troponin I <0.03 <0.03 ng/mL  Differential  Result Value Ref Range   Neutrophils Relative % 73 %  Neutro Abs 6.3 1.7 - 7.7 K/uL   Lymphocytes Relative 12 %   Lymphs Abs 1.0 0.7 - 4.0 K/uL   Monocytes Relative 11 %   Monocytes Absolute 0.9 0.1 - 1.0 K/uL   Eosinophils Relative 4 %   Eosinophils Absolute 0.3 0.0 - 0.7 K/uL   Basophils Relative 0 %   Basophils Absolute 0.0 0.0 - 0.1 K/uL  CBG monitoring, ED  Result Value Ref Range   Glucose-Capillary 94 65 - 99 mg/dL   Dg Chest 2 View Result Date:  04/27/2017 CLINICAL DATA:  Weakness. EXAM: CHEST  2 VIEW COMPARISON:  03/30/2017 FINDINGS: Previous median sternotomy and CABG procedure. The heart size appears normal. Aortic atherosclerosis. No pleural effusion or edema. No airspace opacities. IMPRESSION: 1. No acute findings. 2.  Aortic Atherosclerosis (ICD10-I70.0). Electronically Signed   By: Kerby Moors M.D.   On: 04/27/2017 19:08   Results for DENY, CHEVEZ (MRN [id number]) as of 04/27/2017 19:23  Ref. Range 03/30/2017 17:20 03/31/2017 05:44 03/31/2017 19:49 04/01/2017 08:05 04/27/2017 17:50  Hemoglobin Latest Ref Range: 13.0 - 17.0 g/dL 7.7 (L) 8.9 (L) 9.5 (L) 10.1 (L) 10.6 (L)  HCT Latest Ref Range: 39.0 - 52.0 % 26.8 (L) 29.8 (L) 31.4 (L) 33.8 (L) 35.4 (L)    1920:  Workup reassuring. H/H per baseline. Not orthostatic on VS. No clear indication for admission at this time. Return precautions given. Dx and testing d/w pt and family.  Questions answered.  Verb understanding, agreeable to d/c home with outpt f/u.    Final Clinical Impressions(s) / ED Diagnoses   Final diagnoses:  None    New Prescriptions New Prescriptions   No medications on file      Francine Graven, DO 04/29/17 2340

## 2017-04-29 ENCOUNTER — Ambulatory Visit (HOSPITAL_COMMUNITY): Payer: Medicare Other | Attending: Pulmonary Disease

## 2017-04-29 DIAGNOSIS — M79602 Pain in left arm: Secondary | ICD-10-CM | POA: Diagnosis not present

## 2017-04-29 DIAGNOSIS — X58XXXA Exposure to other specified factors, initial encounter: Secondary | ICD-10-CM | POA: Insufficient documentation

## 2017-04-29 DIAGNOSIS — S40812S Abrasion of left upper arm, sequela: Secondary | ICD-10-CM | POA: Diagnosis not present

## 2017-04-29 NOTE — Therapy (Signed)
[place], [place], [address] Phone: [phone]   Fax:  [phone]  Wound Care Therapy  Patient Details  Name: John Sampson MRN: [id number] Date of Birth: 1936-07-29 Referring Provider: Sinda Du   Encounter Date: 04/29/2017      PT End of Session - 04/29/17 1018    PT Start Time 0950   PT Stop Time 1012   PT Time Calculation (min) 22 min   Activity Tolerance Patient tolerated treatment well;No increased pain   Behavior During Therapy WFL for tasks assessed/performed      Past Medical History:  Diagnosis Date  . Atrial flutter ([place])   . AV malformation of GI tract   . Cancer ([place])    Thyroid  . COPD (chronic obstructive pulmonary disease) ([place]) 3.12.14   2D Echo, EF 50-55%  . Coronary atherosclerosis of native coronary artery    Multivessel status post CABG  . Gastric ulcer    Small - nonbleeding  . GERD (gastroesophageal reflux disease)   . Headache(784.0)   . Iron deficiency anemia    Negative Givens capsule study   . Myocardial infarction ([place])   . PAD (peripheral artery disease) ([facility])    Moderate bilateral SFA disease at angiography 01/2013  . Pituitary macroadenoma [facility])     Past Surgical History:  Procedure Laterality Date  . ABDOMINAL AORTAGRAM N/A 02/19/2013   Procedure: ABDOMINAL Maxcine Ham;  Surgeon: [name], MD;  Location: [facility] CATH LAB;  Service: Cardiovascular;  Laterality: N/A;  . CARDIAC CATHETERIZATION    . CARDIOVERSION N/A 06/16/2015   Procedure: CARDIOVERSION;  Surgeon: Satira Sark, MD;  Location: [facility] ORS;  Service: Cardiovascular;  Laterality: N/A;  . COLONOSCOPY  08/18/2012   Procedure: COLONOSCOPY;  Surgeon: [name], MD;  [facility] ENDO SU[facility];  Service: Endoscopy;  Laterality: N/A;  1:25  . CORONARY ARTERY BYPASS GRAFT  2003   5 grafts - details not clear  . CRANIOTOMY  12/31/2011   Procedure: CRANIOTOMY HYPOPHYSECTOMY TRANSNASAL APPROACH;  Surgeon:  [name], MD;  Location: [place] NEURO ORS;  Service: Neurosurgery;  Laterality: N/A;  Transphenoidal Hypophysectomy With Fat Graft Harvest from right abdomen   . ESOPHAGOGASTRODUODENOSCOPY  08/18/2012   Procedure: ESOPHAGOGASTRODUODENOSCOPY (EGD);  Surgeon: [name], MD;  [facility];  Service: Endoscopy;  Laterality: N/A;  . ESOPHAGOGASTRODUODENOSCOPY N/A 03/31/2017   Procedure: ESOPHAGOGASTRODUODENOSCOPY (EGD);  Surgeon: [name], MD;  [facility];  Service: Endoscopy;  Laterality: N/A;  . EYE SURGERY  2012  . GIVENS CAPSULE STUDY N/A 01/25/2013   Procedure: GIVENS CAPSULE STUDY;  Surgeon: [name], MD;  [facility];  Service: Endoscopy;  Laterality: N/A;  730  . HOT HEMOSTASIS  03/31/2017   Procedure: HOT HEMOSTASIS (ARGON PLASMA COAGULATION/BICAP);  Surgeon: [name], MD;  [facility];  Service: Endoscopy;;  duodenum  . LARYNGOPLASTY Left 09/14/2016   Procedure: LARYNGOPLASTY;  Surgeon: [name], MD;  Location: [place];  Service: ENT;  Laterality: Left;  . LOWER EXTREMITY ANGIOGRAM N/A 02/19/2013   Procedure: LOWER EXTREMITY ANGIOGRAM;  Surgeon: [name], MD;  [facility] CATH LAB;  Service: Cardiovascular;  Laterality: N/A;  . PERIPHERAL VASCULAR CATHETERIZATION N/A 01/19/2016   Procedure: Abdominal Aortogram;  Surgeon: Conrad [place], MD;  Location: [place] CV LAB;  Service: Cardiovascular;  Laterality: N/A;  . PERIPHERAL VASCULAR CATHETERIZATION Bilateral 01/19/2016   Procedure: Lower Extremity Angiography;  Surgeon: Conrad , MD;  Location: [place] CV LAB;  Service: Cardiovascular;  Laterality: Bilateral;  . PV Angiogram  02/19/13   Indications: slow healing left calf ulcer  . Stress Myocardial Perfusion  12/08/2011   Indications: Abnormal EKG, Eval of prior GABG  . TEE WITHOUT CARDIOVERSION N/A 06/16/2015   Procedure: TRANSESOPHAGEAL ECHOCARDIOGRAM (TEE);  Surgeon: Satira Sark, MD;   Location: [facility] ORS;  Service: Cardiovascular;  Laterality: N/A;  . THYROIDECTOMY N/A 09/14/2016   Procedure: TOTAL THYROIDECTOMY;  Surgeon: [name], MD;  Location: [place];  Service: ENT;  Laterality: N/A;  . THYROIDECTOMY    . TONSILLECTOMY  Age 38    There were no vitals filed for this visit.       Subjective Assessment - 04/29/17 1019    Subjective Pt arrived with dressing intact, stated they slid a little bit but no pain.  Pt went to ER Wednesday feeling really weak, all testing came back good.  Going to MD following this session.                     Wound Therapy - 04/29/17 1020    Subjective Pt arrived with dressing intact, stated they slid a little bit but no pain.  Pt went to ER Wednesday feeling really weak, all testing came back good.  Going to MD following this session.     Patient and Family Stated Goals wound to heal    Date of Onset 04/04/17   Prior Treatments self care    Pain Assessment No/denies pain   Evaluation and Treatment Procedures Explained to Patient/Family Yes   Evaluation and Treatment Procedures agreed to   Wound Properties Date First Assessed: 04/12/17 Time First Assessed: 1249 Wound Type: Laceration Location: Arm Location Orientation: Left;Medial;Lateral;Posterior Wound Description (Comments): three wounds:  lateral superior 1x1.5 cm; lateral inferior 1.4x.4; medial 4.2 x 1.8;  All are superfical with 100% granulation    Dressing Type Impregnated gauze (bismuth);Gauze (Comment)  xeroform, 2x2, medipore tape, gauze and netting   Dressing Changed Changed   Dressing Status Old drainage   Dressing Change Frequency PRN   Site / Wound Assessment Clean;Friable   % Wound base Red or Granulating 100%   Peri-wound Assessment Intact   Drainage Amount Scant   Drainage Description Sanguineous   Treatment Cleansed;Debridement (Selective)   Wound Therapy - Clinical Statement Pt arrived with dressings intact, no reports of pain today.  Selective  debridement for removal of slough and dry skin/scab with noted granulation tissue underneath.  Continued with xeroform and gauze dressings with netting.    Factors Delaying/Impairing Wound Healing Multiple medical problems;Polypharmacy;Vascular compromise   Hydrotherapy Plan Debridement;Dressing change;Patient/family education   Wound Therapy - Frequency Other (comment)   Wound Therapy - Current Recommendations PT   Wound Plan self care, cleansing, dressing with xeroform/gauze    Dressing  xeroform, gauze and netting                    PT Short Term Goals - 04/18/17 0853      PT SHORT TERM GOAL #1   Title Wounds on left forearm to have no drainage to stop soiling of clothing.    Time 1   Period Weeks   Status On-going     PT SHORT TERM GOAL #2   Title PT wound to be closed to prevent any risk of infection    Time 3   Period Weeks   Status On-going  Patient will benefit from skilled therapeutic intervention in order to improve the following deficits and impairments:     Visit Diagnosis: Abrasion of left arm, sequela  Pain in left arm     Problem List Patient Active Problem List   Diagnosis Date Noted  . GI bleeding 03/31/2017  . GI bleed 03/30/2017  . Thyroid cancer ([place]) 09/14/2016  . HCAP (healthcare-associated pneumonia) 08/24/2016  . Pneumonia 08/23/2016  . Chronic diastolic congestive heart failure ([place]) 07/24/2016  . Cellulitis of right arm 07/20/2016  . Cellulitis and abscess of hand 07/20/2016  . Venous stasis ulcers ([place]) 12/24/2015  . Chronic venous insufficiency 12/24/2015  . Atherosclerosis of extremity with ulceration ([place]) 12/24/2015  . CHF (congestive heart failure) ([place]) 12/24/2015  . Critical lower limb ischemia 12/24/2015  . Obesity 12/24/2015  . PAF (paroxysmal atrial fibrillation) ([place]) 12/24/2015  . PAD (peripheral artery disease) ([place]) 06/07/2014  . COPD (chronic obstructive pulmonary disease) ([place])  01/23/2013  . Anemia 08/03/2012  . Essential hypertension, benign 08/03/2012  . Mixed hyperlipidemia 08/03/2012  . Hx of CABG 08/03/2012   Ihor Austin, [place]; [place]  Aldona Lento 04/29/2017, 10:25 AM  [facility] [address] [place], [place], [address] Phone: [phone]   Fax:  [phone]  Name: KIJUAN GALLICCHIO MRN: [id number] Date of Birth: 12-12-1935

## 2017-05-02 ENCOUNTER — Ambulatory Visit (HOSPITAL_COMMUNITY): Payer: Medicare Other | Admitting: Physical Therapy

## 2017-05-02 DIAGNOSIS — I4891 Unspecified atrial fibrillation: Secondary | ICD-10-CM | POA: Diagnosis not present

## 2017-05-02 DIAGNOSIS — M25579 Pain in unspecified ankle and joints of unspecified foot: Secondary | ICD-10-CM | POA: Diagnosis not present

## 2017-05-02 DIAGNOSIS — I10 Essential (primary) hypertension: Secondary | ICD-10-CM | POA: Diagnosis not present

## 2017-05-02 DIAGNOSIS — M79672 Pain in left foot: Secondary | ICD-10-CM | POA: Diagnosis not present

## 2017-05-02 DIAGNOSIS — M79671 Pain in right foot: Secondary | ICD-10-CM | POA: Diagnosis not present

## 2017-05-02 DIAGNOSIS — J449 Chronic obstructive pulmonary disease, unspecified: Secondary | ICD-10-CM | POA: Diagnosis not present

## 2017-05-02 DIAGNOSIS — C73 Malignant neoplasm of thyroid gland: Secondary | ICD-10-CM | POA: Diagnosis not present

## 2017-05-03 ENCOUNTER — Ambulatory Visit (HOSPITAL_COMMUNITY): Payer: Medicare Other | Admitting: Physical Therapy

## 2017-05-03 DIAGNOSIS — M79602 Pain in left arm: Secondary | ICD-10-CM | POA: Diagnosis not present

## 2017-05-03 DIAGNOSIS — S40812S Abrasion of left upper arm, sequela: Secondary | ICD-10-CM | POA: Diagnosis not present

## 2017-05-03 NOTE — Therapy (Signed)
[place], [place], [address] Phone: [phone]   Fax:  [phone]  Wound Care Therapy  Patient Details  Name: John Sampson MRN: [id number] Date of Birth: 06/25/1936 Referring Provider: Sinda Du   Encounter Date: 05/03/2017      PT End of Session - 05/03/17 1349    Visit Number 7   Number of Visits 8   Date for PT Re-Evaluation 05/12/17   Authorization Type medicare   Authorization - Visit Number 7   Authorization - Number of Visits 8   PT Start Time [phone]   PT Stop Time 1020   PT Time Calculation (min) 25 min   Activity Tolerance Patient tolerated treatment well   Behavior During Therapy [facility] for tasks assessed/performed      Past Medical History:  Diagnosis Date  . Atrial flutter ([place])   . AV malformation of GI tract   . Cancer ([place])    Thyroid  . COPD (chronic obstructive pulmonary disease) ([place]) 3.12.14   2D Echo, EF 50-55%  . Coronary atherosclerosis of native coronary artery    Multivessel status post CABG  . Gastric ulcer    Small - nonbleeding  . GERD (gastroesophageal reflux disease)   . Headache(784.0)   . Iron deficiency anemia    Negative Givens capsule study   . Myocardial infarction ([place])   . PAD (peripheral artery disease) ([facility])    Moderate bilateral SFA disease at angiography 01/2013  . Pituitary macroadenoma [facility])     Past Surgical History:  Procedure Laterality Date  . ABDOMINAL AORTAGRAM N/A 02/19/2013   Procedure: ABDOMINAL Maxcine Ham;  Surgeon: [name], MD;  [facility] CATH LAB;  Service: Cardiovascular;  Laterality: N/A;  . CARDIAC CATHETERIZATION    . CARDIOVERSION N/A 06/16/2015   Procedure: CARDIOVERSION;  Surgeon: Satira Sark, MD;  Location: [facility] ORS;  Service: Cardiovascular;  Laterality: N/A;  . COLONOSCOPY  08/18/2012   Procedure: COLONOSCOPY;  Surgeon: [name], MD;  [facility];  Service: Endoscopy;  Laterality: N/A;  1:25  .  CORONARY ARTERY BYPASS GRAFT  2003   5 grafts - details not clear  . CRANIOTOMY  12/31/2011   Procedure: CRANIOTOMY HYPOPHYSECTOMY TRANSNASAL APPROACH;  Surgeon: [name], MD;  Location: [place] NEURO ORS;  Service: Neurosurgery;  Laterality: N/A;  Transphenoidal Hypophysectomy With Fat Graft Harvest from right abdomen   . ESOPHAGOGASTRODUODENOSCOPY  08/18/2012   Procedure: ESOPHAGOGASTRODUODENOSCOPY (EGD);  Surgeon: [name], MD;  [facility];  Service: Endoscopy;  Laterality: N/A;  . ESOPHAGOGASTRODUODENOSCOPY N/A 03/31/2017   Procedure: ESOPHAGOGASTRODUODENOSCOPY (EGD);  Surgeon: [name], MD;  [facility];  Service: Endoscopy;  Laterality: N/A;  . EYE SURGERY  2012  . GIVENS CAPSULE STUDY N/A 01/25/2013   Procedure: GIVENS CAPSULE STUDY;  Surgeon: [name], MD;  [facility];  Service: Endoscopy;  Laterality: N/A;  730  . HOT HEMOSTASIS  03/31/2017   Procedure: HOT HEMOSTASIS (ARGON PLASMA COAGULATION/BICAP);  Surgeon: [name], MD;  [facility];  Service: Endoscopy;;  duodenum  . LARYNGOPLASTY Left 09/14/2016   Procedure: LARYNGOPLASTY;  Surgeon: [name], MD;  Location: [place];  Service: ENT;  Laterality: Left;  . LOWER EXTREMITY ANGIOGRAM N/A 02/19/2013   Procedure: LOWER EXTREMITY ANGIOGRAM;  Surgeon: [name], MD;  Location[facility] CATH LAB;  Service: Cardiovascular;  Laterality: N/A;  . PERIPHERAL VASCULAR CATHETERIZATION N/A 01/19/2016  Procedure: Abdominal Aortogram;  Surgeon: Conrad [place], MD;  Location: [place] CV LAB;  Service: Cardiovascular;  Laterality: N/A;  . PERIPHERAL VASCULAR CATHETERIZATION Bilateral 01/19/2016   Procedure: Lower Extremity Angiography;  Surgeon: Conrad , MD;  Location: [place] CV LAB;  Service: Cardiovascular;  Laterality: Bilateral;  . PV Angiogram  02/19/13   Indications: slow healing left calf ulcer  . Stress Myocardial Perfusion  12/08/2011   Indications:  Abnormal EKG, Eval of prior GABG  . TEE WITHOUT CARDIOVERSION N/A 06/16/2015   Procedure: TRANSESOPHAGEAL ECHOCARDIOGRAM (TEE);  Surgeon: Satira Sark, MD;  Location: [facility];  Service: Cardiovascular;  Laterality: N/A;  . THYROIDECTOMY N/A 09/14/2016   Procedure: TOTAL THYROIDECTOMY;  Surgeon: [name], MD;  Location: [place];  Service: ENT;  Laterality: N/A;  . THYROIDECTOMY    . TONSILLECTOMY  Age 81    There were no vitals filed for this visit.                  Wound Therapy - 05/03/17 1345    Subjective Pt comes today secondary to conflicting appointment.  Pt comes with dressing intact.  No pain.    Patient and Family Stated Goals wound to heal    Date of Onset 04/04/17   Prior Treatments self care    Pain Assessment No/denies pain   Evaluation and Treatment Procedures Explained to Patient/Family Yes   Evaluation and Treatment Procedures agreed to   Wound Properties Date First Assessed: 04/12/17 Time First Assessed: 1249 Wound Type: Laceration Location: Arm Location Orientation: Left;Medial;Lateral;Posterior Wound Description (Comments): three wounds:  lateral superior 1x1.5 cm; lateral inferior 1.4x.4; medial 4.2 x 1.8;  All are superfical with 100% granulation    Dressing Type Impregnated gauze (bismuth);Gauze (Comment)  xeroform, 2x2, medipore tape, gauze and netting   Dressing Changed Changed   Dressing Status Old drainage   Dressing Change Frequency PRN   Site / Wound Assessment Clean;Friable   % Wound base Red or Granulating 100%   Peri-wound Assessment Intact   Drainage Amount Scant   Drainage Description Sanguineous   Treatment Cleansed;Debridement (Selective)   Selective Debridement - Location Lt forearm   Selective Debridement - Tools Used Forceps   Selective Debridement - Tissue Removed removal of dry skin and slough from wounds   Wound Therapy - Clinical Statement Pt arrived with dressings intact, no reports of pain today.  Selective  debridement for removal of slough and dry skin/scab with noted granulation tissue underneath.  Continued with xeroform and gauze dressings with netting.   Very small areas remain without any active bleeding noted this session  Applied netting up onto upper UE to prevent slippage into cubital region.  Pt reported comfort with bandaging.     Factors Delaying/Impairing Wound Healing Multiple medical problems;Polypharmacy;Vascular compromise   Hydrotherapy Plan Debridement;Dressing change;Patient/family education   Wound Therapy - Frequency Other (comment)   Wound Therapy - Current Recommendations PT   Wound Plan Re-measure/reassess next session.   Dressing  xeroform, gauze and netting                    PT Short Term Goals - 04/18/17 0853      PT SHORT TERM GOAL #1   Title Wounds on left forearm to have no drainage to stop soiling of clothing.    Time 1   Period Weeks   Status On-going     PT SHORT TERM GOAL #2   Title PT wound to  be closed to prevent any risk of infection    Time 3   Period Weeks   Status On-going                Patient will benefit from skilled therapeutic intervention in order to improve the following deficits and impairments:     Visit Diagnosis: Abrasion of left arm, sequela  Pain in left arm     Problem List Patient Active Problem List   Diagnosis Date Noted  . GI bleeding 03/31/2017  . GI bleed 03/30/2017  . Thyroid cancer ([place]) 09/14/2016  . HCAP (healthcare-associated pneumonia) 08/24/2016  . Pneumonia 08/23/2016  . Chronic diastolic congestive heart failure ([place]) 07/24/2016  . Cellulitis of right arm 07/20/2016  . Cellulitis and abscess of hand 07/20/2016  . Venous stasis ulcers ([place]) 12/24/2015  . Chronic venous insufficiency 12/24/2015  . Atherosclerosis of extremity with ulceration ([place]) 12/24/2015  . CHF (congestive heart failure) ([place]) 12/24/2015  . Critical lower limb ischemia 12/24/2015  . Obesity 12/24/2015  .  PAF (paroxysmal atrial fibrillation) ([place]) 12/24/2015  . PAD (peripheral artery disease) ([place]) 06/07/2014  . COPD (chronic obstructive pulmonary disease) ([place]) 01/23/2013  . Anemia 08/03/2012  . Essential hypertension, benign 08/03/2012  . Mixed hyperlipidemia 08/03/2012  . Hx of CABG 08/03/2012    Teena Irani, PTA/CLT [phone]  05/03/2017, 1:53 PM  [facility] [address] [place], [place], [address] Phone: [phone]   Fax:  [phone]  Name: IZACC DEMEYER MRN: [id number] Date of Birth: [birth year]

## 2017-05-04 ENCOUNTER — Ambulatory Visit (HOSPITAL_COMMUNITY): Payer: Medicare Other | Admitting: Physical Therapy

## 2017-05-04 DIAGNOSIS — E890 Postprocedural hypothyroidism: Secondary | ICD-10-CM | POA: Diagnosis not present

## 2017-05-06 ENCOUNTER — Telehealth: Payer: Self-pay | Admitting: *Deleted

## 2017-05-06 ENCOUNTER — Ambulatory Visit (HOSPITAL_COMMUNITY): Payer: Medicare Other

## 2017-05-06 DIAGNOSIS — S40812S Abrasion of left upper arm, sequela: Secondary | ICD-10-CM | POA: Diagnosis not present

## 2017-05-06 DIAGNOSIS — M79602 Pain in left arm: Secondary | ICD-10-CM | POA: Diagnosis not present

## 2017-05-06 NOTE — Telephone Encounter (Signed)
Called to notify pt that his Eliquis has arrived in the office.

## 2017-05-06 NOTE — Therapy (Signed)
[place], [place], [address] Phone: [phone]   Fax:  [phone]  Wound Care Therapy  Patient Details  Name: John Sampson MRN: [id number] Date of Birth: 11-30-1935 Referring Provider: Sinda Du   Encounter Date: 05/06/2017      PT End of Session - 05/06/17 0854    Visit Number 8   Number of Visits 12   Date for PT Re-Evaluation 05/12/17   Authorization Type medicare   Authorization - Visit Number 8   Authorization - Number of Visits 12   PT Start Time 0814   PT Stop Time 0837   PT Time Calculation (min) 23 min   Activity Tolerance Patient tolerated treatment well   Behavior During Therapy [facility] for tasks assessed/performed      Past Medical History:  Diagnosis Date  . Atrial flutter ([place])   . AV malformation of GI tract   . Cancer ([place])    Thyroid  . COPD (chronic obstructive pulmonary disease) ([place]) 3.12.14   2D Echo, EF 50-55%  . Coronary atherosclerosis of native coronary artery    Multivessel status post CABG  . Gastric ulcer    Small - nonbleeding  . GERD (gastroesophageal reflux disease)   . Headache(784.0)   . Iron deficiency anemia    Negative Givens capsule study   . Myocardial infarction ([place])   . PAD (peripheral artery disease) (H[facility])    Moderate bilateral SFA disease at angiography 01/2013  . Pituitary macroadenoma [facility])     Past Surgical History:  Procedure Laterality Date  . ABDOMINAL AORTAGRAM N/A 02/19/2013   Procedure: ABDOMINAL Maxcine Ham;  Surgeon: [name], MD;  Location: [facility] CATH LAB;  Service: Cardiovascular;  Laterality: N/A;  . CARDIAC CATHETERIZATION    . CARDIOVERSION N/A 06/16/2015   Procedure: CARDIOVERSION;  Surgeon: Satira Sark, MD;  [facility]: [facility] ORS;  Service: Cardiovascular;  Laterality: N/A;  . COLONOSCOPY  08/18/2012   Procedure: COLONOSCOPY;  Surgeon: [name], MD;  [facility] ENDO SU[facility];  Service: Endoscopy;  Laterality: N/A;  1:25   . CORONARY ARTERY BYPASS GRAFT  2003   5 grafts - details not clear  . CRANIOTOMY  12/31/2011   Procedure: CRANIOTOMY HYPOPHYSECTOMY TRANSNASAL APPROACH;  Surgeon: [name], MD;  Location: [place] NEURO ORS;  Service: Neurosurgery;  Laterality: N/A;  Transphenoidal Hypophysectomy With Fat Graft Harvest from right abdomen   . ESOPHAGOGASTRODUODENOSCOPY  08/18/2012   Procedure: ESOPHAGOGASTRODUODENOSCOPY (EGD);  Surgeon: [name], MD;  [facility] ENDO [facility];  Service: Endoscopy;  Laterality: N/A;  . ESOPHAGOGASTRODUODENOSCOPY N/A 03/31/2017   Procedure: ESOPHAGOGASTRODUODENOSCOPY (EGD);  Surgeon: [name], MD;  [facility];  Service: Endoscopy;  Laterality: N/A;  . EYE SURGERY  2012  . GIVENS CAPSULE STUDY N/A 01/25/2013   Procedure: GIVENS CAPSULE STUDY;  Surgeon: [name], MD;  [facility] END[facility];  Service: Endoscopy;  Laterality: N/A;  730  . HOT HEMOSTASIS  03/31/2017   Procedure: HOT HEMOSTASIS (ARGON PLASMA COAGULATION/BICAP);  Surgeon: [name], MD;  [facility] ENDO [facility];  Service: Endoscopy;;  duodenum  . LARYNGOPLASTY Left 09/14/2016   Procedure: LARYNGOPLASTY;  Surgeon: [name], MD;  Location: [place];  Service: ENT;  Laterality: Left;  . LOWER EXTREMITY ANGIOGRAM N/A 02/19/2013   Procedure: LOWER EXTREMITY ANGIOGRAM;  Surgeon: [name], MD;  Location[facility] C[facility] LAB;  Service: Cardiovascular;  Laterality: N/A;  . PERIPHERAL VASCULAR CATHETERIZATION N/A 01/19/2016  Procedure: Abdominal Aortogram;  Surgeon: Conrad [place]operton, MD;  Location: [place] CV LAB;  Service: Cardiovascular;  Laterality: N/A;  . PERIPHERAL VASCULAR CATHETERIZATION Bilateral 01/19/2016   Procedure: Lower Extremity Angiography;  Surgeon: Conrad [place], MD;  Location: [employer] CV LAB;  Service: Cardiovascular;  Laterality: Bilateral;  . PV Angiogram  02/19/13   Indications: slow healing left calf ulcer  . Stress Myocardial Perfusion  12/08/2011   Indications:  Abnormal EKG, Eval of prior GABG  . TEE WITHOUT CARDIOVERSION N/A 06/16/2015   Procedure: TRANSESOPHAGEAL ECHOCARDIOGRAM (TEE);  Surgeon: Satira Sark, MD;  Location: [facility] ORS;  Service: Cardiovascular;  Laterality: N/A;  . THYROIDECTOMY N/A 09/14/2016   Procedure: TOTAL THYROIDECTOMY;  Surgeon: [name], MD;  Location: [place];  Service: ENT;  Laterality: N/A;  . THYROIDECTOMY    . TONSILLECTOMY  Age 35    There were no vitals filed for this visit.       Subjective Assessment - 05/06/17 0851    Subjective Pt arrived with dressings intact, no reports of pain.                   Wound Therapy - 05/06/17 0851    Subjective Pt arrived with dressings intact, no reports of pain.   Patient and Family Stated Goals wound to heal    Date of Onset 04/04/17   Prior Treatments self care    Pain Assessment No/denies pain   Evaluation and Treatment Procedures Explained to Patient/Family Yes   Evaluation and Treatment Procedures agreed to   Wound Properties Date First Assessed: 04/12/17 Time First Assessed: 1249 Wound Type: Laceration Location: Arm Location Orientation: Left;Medial;Lateral;Posterior Wound Description (Comments): three wounds:  lateral superior 1x1.5 cm; lateral inferior 1.4x.4; medial 4.2 x 1.8;  All are superfical with 100% granulation    Dressing Type Impregnated gauze (bismuth);Gauze (Comment)  xeroform, 2x2, medipore tape and gauze/netting   Dressing Changed Changed   Dressing Status Old drainage   Dressing Change Frequency PRN   Site / Wound Assessment Clean;Friable   % Wound base Red or Granulating 100%   Peri-wound Assessment Intact   Wound Length (cm) --  lateral superior.4x.5; lateral inferior.6x.2; medial1.3x.8   Drainage Amount Scant   Drainage Description Sanguineous   Treatment Cleansed;Debridement (Selective)   Selective Debridement - Location Lt forearm   Selective Debridement - Tools Used Forceps   Selective Debridement - Tissue  Removed removal of dry skin and slough from wounds   Wound Therapy - Clinical Statement Pt arrived with dressings intact, reports minimal amount of dressing sliding with the medipore tape assistance.  Upon removal of dressings noted 2 small additional wounds .1x .1 on medial bend of elbow, slight puss with selective debridement with 100% granuation tissue following debridement.  Overall wounds are improving granulation tissue, minimal amount of slough following debridement and decrease in wound size.  Lateral superior at .4x .5 (was 1x1.5); lateral interior .6x .2 (was 1.4x.4) and medial wound 1.3x .8 (was 4.2x 1.8).  Continued with xeroform, 2x2 with medipore tape and gauze with netting.     Factors Delaying/Impairing Wound Healing Multiple medical problems;Polypharmacy;Vascular compromise   Hydrotherapy Plan Debridement;Dressing change;Patient/family education   Wound Therapy - Frequency Other (comment)Pt will need continued skilled care for 2 times a week for two more weeks; for a total of 10 visits    Wound Therapy - Current Recommendations PT   Wound Plan self care, cleansing, dressing with xeroform/gauze    Dressing  xeroform,  gauze and netting                    PT Short Term Goals - 04/18/17 0853      PT SHORT TERM GOAL #1   Title Wounds on left forearm to have no drainage to stop soiling of clothing.    Time 1   Period Weeks   Status On-going     PT SHORT TERM GOAL #2   Title PT wound to be closed to prevent any risk of infection    Time 3   Period Weeks   Status On-going                Patient will benefit from skilled therapeutic intervention in order to improve the following deficits and impairments:     Visit Diagnosis: Abrasion of left arm, sequela  Pain in left arm     Problem List Patient Active Problem List   Diagnosis Date Noted  . GI bleeding 03/31/2017  . GI bleed 03/30/2017  . Thyroid cancer ([place]) 09/14/2016  . HCAP  (healthcare-associated pneumonia) 08/24/2016  . Pneumonia 08/23/2016  . Chronic diastolic congestive heart failure ([place]) 07/24/2016  . Cellulitis of right arm 07/20/2016  . Cellulitis and abscess of hand 07/20/2016  . Venous stasis ulcers ([place]) 12/24/2015  . Chronic venous insufficiency 12/24/2015  . Atherosclerosis of extremity with ulceration ([place]) 12/24/2015  . CHF (congestive heart failure) ([place]) 12/24/2015  . Critical lower limb ischemia 12/24/2015  . Obesity 12/24/2015  . PAF (paroxysmal atrial fibrillation) ([place]) 12/24/2015  . PAD (peripheral artery disease) ([place]) 06/07/2014  . COPD (chronic obstructive pulmonary disease) ([place]) 01/23/2013  . Anemia 08/03/2012  . Essential hypertension, benign 08/03/2012  . Mixed hyperlipidemia 08/03/2012  . Hx of CABG 08/03/2012   Ihor Austin, LPTA; [place]  Rayetta Humphrey, PT CLT [phone] 05/06/2017, 9:12 AM  [place] [address] [place], [place], [address] Phone: [phone]   Fax:  [phone]  Name: John Sampson MRN: [id number] Date of Birth: July 04, 1936

## 2017-05-09 ENCOUNTER — Ambulatory Visit (HOSPITAL_COMMUNITY): Payer: Medicare Other | Admitting: Physical Therapy

## 2017-05-09 DIAGNOSIS — M79602 Pain in left arm: Secondary | ICD-10-CM | POA: Diagnosis not present

## 2017-05-09 DIAGNOSIS — S40812S Abrasion of left upper arm, sequela: Secondary | ICD-10-CM | POA: Diagnosis not present

## 2017-05-09 NOTE — Therapy (Signed)
John Sampson, [place], [address] Phone: [phone]   Fax:  [phone]  Wound Care Therapy  Patient Details  Name: John Sampson MRN: [id number] Date of Birth: Jan 07, 1936 Referring Provider: Sinda Du  Encounter Date: 05/09/2017      PT End of Session - 05/09/17 0931    Visit Number 9   Number of Visits 12   Date for PT Re-Evaluation 05/12/17   Authorization Type medicare   Authorization - Visit Number 9   Authorization - Number of Visits 12   PT Start Time 0815   PT Stop Time 0835   PT Time Calculation (min) 20 min   Activity Tolerance Patient tolerated treatment well   Behavior During Therapy [facility] for tasks assessed/performed      Past Medical History:  Diagnosis Date  . Atrial flutter ([place])   . AV malformation of GI tract   . Cancer ([place])    Thyroid  . COPD (chronic obstructive pulmonary disease) ([place]) 3.12.14   2D Echo, EF 50-55%  . Coronary atherosclerosis of native coronary artery    Multivessel status post CABG  . Gastric ulcer    Small - nonbleeding  . GERD (gastroesophageal reflux disease)   . Headache(784.0)   . Iron deficiency anemia    Negative Givens capsule study   . Myocardial infarction ([place])   . PAD (peripheral artery disease) ([facility])    Moderate bilateral SFA disease at angiography 01/2013  . Pituitary macroadenoma [facility])     Past Surgical History:  Procedure Laterality Date  . ABDOMINAL AORTAGRAM N/A 02/19/2013   Procedure: ABDOMINAL Maxcine Ham;  Surgeon: [name], MD;  Location: [facility];  Service: Cardiovascular;  Laterality: N/A;  . CARDIAC CATHETERIZATION    . CARDIOVERSION N/A 06/16/2015   Procedure: CARDIOVERSION;  Surgeon: Satira Sark, MD;  Location: [facility] ORS;  Service: Cardiovascular;  Laterality: N/A;  . COLONOSCOPY  08/18/2012   Procedure: COLONOSCOPY;  Surgeon: [name], MD;  [facility];  Service: Endoscopy;  Laterality: N/A;  1:25   . CORONARY ARTERY BYPASS GRAFT  2003   5 grafts - details not clear  . CRANIOTOMY  12/31/2011   Procedure: CRANIOTOMY HYPOPHYSECTOMY TRANSNASAL APPROACH;  Surgeon: [name], MD;  Location: [place] NEURO ORS;  Service: Neurosurgery;  Laterality: N/A;  Transphenoidal Hypophysectomy With Fat Graft Harvest from right abdomen   . ESOPHAGOGASTRODUODENOSCOPY  08/18/2012   Procedure: ESOPHAGOGASTRODUODENOSCOPY (EGD);  Surgeon: [name], MD;  [facility];  Service: Endoscopy;  Laterality: N/A;  . ESOPHAGOGASTRODUODENOSCOPY N/A 03/31/2017   Procedure: ESOPHAGOGASTRODUODENOSCOPY (EGD);  Surgeon: [name], MD;  [facility];  Service: Endoscopy;  Laterality: N/A;  . EYE SURGERY  2012  . GIVENS CAPSULE STUDY N/A 01/25/2013   Procedure: GIVENS CAPSULE STUDY;  Surgeon: [name], MD;  [facility];  Service: Endoscopy;  Laterality: N/A;  730  . HOT HEMOSTASIS  03/31/2017   Procedure: HOT HEMOSTASIS (ARGON PLASMA COAGULATION/BICAP);  Surgeon: [name], MD;  [facility];  Service: Endoscopy;;  duodenum  . LARYNGOPLASTY Left 09/14/2016   Procedure: LARYNGOPLASTY;  Surgeon: [name], MD;  Location: [place];  Service: ENT;  Laterality: Left;  . LOWER EXTREMITY ANGIOGRAM N/A 02/19/2013   Procedure: LOWER EXTREMITY ANGIOGRAM;  Surgeon: [name], MD;  Location: [facility] CATH [facility];  Service: Cardiovascular;  Laterality: N/A;  . PERIPHERAL VASCULAR CATHETERIZATION N/A 01/19/2016  Procedure: Abdominal Aortogram;  Surgeon: Conrad Sheldon, MD;  Location: [place];  Service: Cardiovascular;  Laterality: N/A;  . PERIPHERAL VASCULAR CATHETERIZATION Bilateral 01/19/2016   Procedure: Lower Extremity Angiography;  Surgeon: Conrad [place], MD;  Location: [place];  Service: Cardiovascular;  Laterality: Bilateral;  . PV Angiogram  02/19/13   Indications: slow healing left calf ulcer  . Stress Myocardial Perfusion  12/08/2011   Indications:  Abnormal EKG, Eval of prior GABG  . TEE WITHOUT CARDIOVERSION N/A 06/16/2015   Procedure: TRANSESOPHAGEAL ECHOCARDIOGRAM (TEE);  Surgeon: Satira Sark, MD;  Location: [facility];  Service: Cardiovascular;  Laterality: N/A;  . THYROIDECTOMY N/A 09/14/2016   Procedure: TOTAL THYROIDECTOMY;  Surgeon: [name], MD;  Location: [facility];  Service: ENT;  Laterality: N/A;  . THYROIDECTOMY    . TONSILLECTOMY  Age 81    There were no vitals filed for this visit.         [facility] PT Assessment - 05/09/17 0926      Assessment   Medical Diagnosis non healing wound   Referring Provider Sinda Du   Onset Date/Surgical Date 04/04/17   Hand Dominance Right   Next MD Visit not scheduled   Prior Therapy none     Precautions   Precautions Other (comment)  cellulitis     Restrictions   Weight Bearing Restrictions No     John [facility]pson residence     Prior Function   Level of Independence Independent                 Wound Therapy - 05/09/17 0928    Subjective Pt arrived with dressings intact, no reports of pain.   Patient and Family Stated Goals wound to heal    Date of Onset 04/04/17   Prior Treatments self care    Pain Assessment No/denies pain   Evaluation and Treatment Procedures Explained to Patient/Family Yes   Evaluation and Treatment Procedures agreed to   Wound Properties Date First Assessed: 04/12/17 Time First Assessed: 1249 Wound Type: Laceration Location: Arm Location Orientation: Left;Medial;Lateral;Posterior Wound Description (Comments): three wounds:  lateral superior 1x1.5 cm; lateral inferior 1.4x.4; medial 4.2 x 1.8;  All are superfical with 100% granulation    Dressing Type Impregnated gauze (bismuth);Gauze (Comment)  xeroform, 2x2, medipore tape and gauze/netting   Dressing Changed Changed   Dressing Status Old drainage   Dressing Change Frequency PRN   Site / Wound Assessment Clean;Friable   % Wound base Red or  Granulating 100%   Peri-wound Assessment Intact   Drainage Amount Scant   Drainage Description Sanguineous   Treatment Cleansed;Debridement (Selective)   Selective Debridement - Location Lt forearm   Selective Debridement - Tools Used Forceps   Selective Debridement - Tissue Removed removal of dry skin and slough from wounds   Wound Therapy - Clinical Statement dressings intact without c/o pain or noted bleeding through bandages.  Wounds all healed except one small one positioned towards the elbow region.  Cleansed arm well and reapplied xeroform and medipore.  Left off the kerlix and netting due to scrunching of dressings and rubbing on skin.     Factors Delaying/Impairing Wound Healing Multiple medical problems;Polypharmacy;Vascular compromise   Hydrotherapy Plan Debridement;Dressing change;Patient/family education   Wound Therapy - Frequency Other (comment)   Wound Therapy - Current Recommendations PT   Wound Plan self care, cleansing, dressing with xeroform/gauze .  Anticipate dishcharge next session.    Dressing  xeroform, gauze and netting                    PT Short Term Goals - 04/18/17 0853      PT SHORT TERM GOAL #1   Title Wounds on left forearm to have no drainage to stop soiling of clothing.    Time 1   Period Weeks   Status On-going     PT SHORT TERM GOAL #2   Title PT wound to be closed to prevent any risk of infection    Time 3   Period Weeks   Status On-going                Patient will benefit from skilled therapeutic intervention in order to improve the following deficits and impairments:     Visit Diagnosis: Abrasion of left arm, sequela  Pain in left arm     Problem List Patient Active Problem List   Diagnosis Date Noted  . GI bleeding 03/31/2017  . GI bleed 03/30/2017  . Thyroid cancer ([place]) 09/14/2016  . HCAP (healthcare-associated pneumonia) 08/24/2016  . Pneumonia 08/23/2016  . Chronic diastolic congestive heart failure  ([place]) 07/24/2016  . Cellulitis of right arm 07/20/2016  . Cellulitis and abscess of hand 07/20/2016  . Venous stasis ulcers ([place]) 12/24/2015  . Chronic venous insufficiency 12/24/2015  . Atherosclerosis of extremity with ulceration ([place]) 12/24/2015  . CHF (congestive heart failure) ([place]) 12/24/2015  . Critical lower limb ischemia 12/24/2015  . Obesity 12/24/2015  . PAF (paroxysmal atrial fibrillation) ([place]) 12/24/2015  . PAD (peripheral artery disease) ([place]) 06/07/2014  . COPD (chronic obstructive pulmonary disease) ([place]) 01/23/2013  . Anemia 08/03/2012  . Essential hypertension, benign 08/03/2012  . Mixed hyperlipidemia 08/03/2012  . Hx of CABG 08/03/2012    Teena Irani, PTA/CLT [phone]  05/09/2017, 9:32 AM  [facility] [address] [place], [place], [address] Phone: [phone]   Fax:  [phone]  Name: MARCIA LEPERA MRN: [id number] Date of Birth: 1936/01/16

## 2017-05-11 ENCOUNTER — Ambulatory Visit (HOSPITAL_COMMUNITY): Payer: Medicare Other | Admitting: Physical Therapy

## 2017-05-11 DIAGNOSIS — M79602 Pain in left arm: Secondary | ICD-10-CM | POA: Diagnosis not present

## 2017-05-11 DIAGNOSIS — S40812S Abrasion of left upper arm, sequela: Secondary | ICD-10-CM | POA: Diagnosis not present

## 2017-05-11 NOTE — Therapy (Signed)
[place], [place], [address] Phone: [phone]   Fax:  [phone]  Wound Care Therapy/Discharge   Patient Details  Name: John Sampson MRN: [id number] Date of Birth: [birth year] Referring Provider: Sinda Du  Encounter Date: 05/11/2017      PT End of Session - 05/11/17 0933    Visit Number 10   Number of Visits 10   Date for PT Re-Evaluation 05/12/17   Authorization Type medicare   Authorization - Visit Number 10   Authorization - Number of Visits 10   PT Start Time 0900   PT Stop Time 0920   PT Time Calculation (min) 20 min   Activity Tolerance Patient tolerated treatment well   Behavior During Therapy [facility] for tasks assessed/performed      Past Medical History:  Diagnosis Date  . Atrial flutter ([place])   . AV malformation of GI tract   . Cancer ([place])    Thyroid  . COPD (chronic obstructive pulmonary disease) ([place]) 3.12.14   2D Echo, EF 50-55%  . Coronary atherosclerosis of native coronary artery    Multivessel status post CABG  . Gastric ulcer    Small - nonbleeding  . GERD (gastroesophageal reflux disease)   . Headache(784.0)   . Iron deficiency anemia    Negative Givens capsule study   . Myocardial infarction ([place])   . PAD (peripheral artery disease) (H[facility])    Moderate bilateral SFA disease at angiography 01/2013  . Pituitary macroadenoma [facility])     Past Surgical History:  Procedure Laterality Date  . ABDOMINAL AORTAGRAM N/A 02/19/2013   Procedure: ABDOMINAL Maxcine Ham;  Surgeon: [name], MD;  Location: [facility] CATH LAB;  Service: Cardiovascular;  Laterality: N/A;  . CARDIAC CATHETERIZATION    . CARDIOVERSION N/A 06/16/2015   Procedure: CARDIOVERSION;  Surgeon: Satira Sark, MD;  Location: [facility] ORS;  Service: Cardiovascular;  Laterality: N/A;  . COLONOSCOPY  08/18/2012   Procedure: COLONOSCOPY;  Surgeon: [name], MD;  Location: [facility] ENDO SUITE;  Service: Endoscopy;  Laterality:  N/A;  1:25  . CORONARY ARTERY BYPASS GRAFT  2003   5 grafts - details not clear  . CRANIOTOMY  12/31/2011   Procedure: CRANIOTOMY HYPOPHYSECTOMY TRANSNASAL APPROACH;  Surgeon: [name], MD;  Location: [place] NEURO ORS;  Service: Neurosurgery;  Laterality: N/A;  Transphenoidal Hypophysectomy With Fat Graft Harvest from right abdomen   . ESOPHAGOGASTRODUODENOSCOPY  08/18/2012   Procedure: ESOPHAGOGASTRODUODENOSCOPY (EGD);  Surgeon: [name], MD;  [facility] ENDO [facility];  Service: Endoscopy;  Laterality: N/A;  . ESOPHAGOGASTRODUODENOSCOPY N/A 03/31/2017   Procedure: ESOPHAGOGASTRODUODENOSCOPY (EGD);  Surgeon: [name], MD;  [facility] END[facility];  Service: Endoscopy;  Laterality: N/A;  . EYE SURGERY  2012  . GIVENS CAPSULE STUDY N/A 01/25/2013   Procedure: GIVENS CAPSULE STUDY;  Surgeon: [name], MD;  Location: [facility] ENDO SUITE;  Service: Endoscopy;  Laterality: N/A;  730  . HOT HEMOSTASIS  03/31/2017   Procedure: HOT HEMOSTASIS (ARGON PLASMA COAGULATION/BICAP);  Surgeon: [name], MD;  [facility] ENDO [facility];  Service: Endoscopy;;  duodenum  . LARYNGOPLASTY Left 09/14/2016   Procedure: LARYNGOPLASTY;  Surgeon: [name], MD;  Location: [place];  Service: ENT;  Laterality: Left;  . LOWER EXTREMITY ANGIOGRAM N/A 02/19/2013   Procedure: LOWER EXTREMITY ANGIOGRAM;  Surgeon: [name], MD;  Location: [facility] CATH LAB;  Service: Cardiovascular;  Laterality: N/A;  . PERIPHERAL VASCULAR CATHETERIZATION N/A 01/19/2016  Procedure: Abdominal Aortogram;  Surgeon: Conrad [place], MD;  Location: [place] CV LAB;  Service: Cardiovascular;  Laterality: N/A;  . PERIPHERAL VASCULAR CATHETERIZATION Bilateral 01/19/2016   Procedure: Lower Extremity Angiography;  Surgeon: Conrad , MD;  Location: [place] CV LAB;  Service: Cardiovascular;  Laterality: Bilateral;  . PV Angiogram  02/19/13   Indications: slow healing left calf ulcer  . Stress Myocardial Perfusion  12/08/2011    Indications: Abnormal EKG, Eval of prior GABG  . TEE WITHOUT CARDIOVERSION N/A 06/16/2015   Procedure: TRANSESOPHAGEAL ECHOCARDIOGRAM (TEE);  Surgeon: Satira Sark, MD;  Location: [facility] ORS;  Service: Cardiovascular;  Laterality: N/A;  . THYROIDECTOMY N/A 09/14/2016   Procedure: TOTAL THYROIDECTOMY;  Surgeon: [name], MD;  Location: [employer];  Service: ENT;  Laterality: N/A;  . THYROIDECTOMY    . TONSILLECTOMY  Age 81    There were no vitals filed for this visit.                  Wound Therapy - 05/11/17 0926    Subjective Pt states that she has no pain.     Patient and Family Stated Goals wound to heal    Date of Onset 04/04/17   Prior Treatments self care    Pain Assessment No/denies pain   Evaluation and Treatment Procedures Explained to Patient/Family Yes   Evaluation and Treatment Procedures agreed to   Wound Properties Date First Assessed: 04/12/17 Time First Assessed: 1249 Wound Type: Laceration Location: Arm Location Orientation: Left;Medial;Lateral;Posterior Wound Description (Comments): three wounds:  lateral superior 1x1.5 cm; lateral inferior 1.4x.4; medial 4.2 x 1.8;  All are superfical with 100% granulation    Dressing Type Impregnated gauze (bismuth);Gauze (Comment)  xeroform, 2x2, medipore tape and gauze/netting   Dressing Changed Changed   Dressing Status Clean   Dressing Change Frequency PRN   Site / Wound Assessment Clean   % Wound base Red or Granulating 100%   Peri-wound Assessment Intact   Drainage Amount None   Treatment Cleansed   Selective Debridement - Location Lt forearm   Wound Therapy - Clinical Statement No bleeding only scant areas of less than .2x .1 present.  Pt arm cleansed followed by dressing change.  Pt instructed to keep dressing on until Thursday 05/13/2017 and then remove.  At this time pt can keep all dressings off.  Pt urged to moisturize entire body twice a day to keep skin as supple as possible.  PT verbalized  understanding.;    Factors Delaying/Impairing Wound Healing Multiple medical problems;Polypharmacy;Vascular compromise   Hydrotherapy Plan Debridement;Dressing change;Patient/family education   Wound Therapy - Frequency Other (comment)   Wound Therapy - Current Recommendations PT   Wound Plan Discharge pt    Dressing  xeroform, gauze medicpore tape                 PT Education - 05/11/17 0932    Education provided Yes   Education Details The importance of moisturizing skin as we grow older.   Person(s) Educated Patient   Methods Explanation   Comprehension Verbalized understanding          PT Short Term Goals - 05/11/17 0934      PT SHORT TERM GOAL #1   Title Wounds on left forearm to have no drainage to stop soiling of clothing.    Time 1   Period Weeks   Status Achieved     PT SHORT TERM GOAL #2   Title PT wound  to be closed to prevent any risk of infection    Time 3   Period Weeks   Status Achieved                  Plan - May 30, 2017 0933    Clinical Impression Statement as above    Rehab Potential Good   PT Frequency 2x / week   PT Duration 4 weeks   PT Treatment/Interventions ADLs/Self Care Home Management;Other (comment)  debridement if needed and dressing change    PT Next Visit Plan Discharge pt to home care.    Consulted and Agree with Plan of Care Patient      Patient will benefit from skilled therapeutic intervention in order to improve the following deficits and impairments:  Pain, Other (comment) (non-healing wound )  Visit Diagnosis: Abrasion of left arm, sequela  Pain in left arm      G-Codes - 05/30/17 0934    Functional Limitation Self care   Self Care Goal Status (L2440) At least 1 percent but less than 20 percent impaired, limited or restricted   Self Care Discharge Status [phone]) At least 1 percent but less than 20 percent impaired, limited or restricted       Problem List Patient Active Problem List   Diagnosis  Date Noted  . GI bleeding 03/31/2017  . GI bleed 03/30/2017  . Thyroid cancer ([place]) 09/14/2016  . HCAP (healthcare-associated pneumonia) 08/24/2016  . Pneumonia 08/23/2016  . Chronic diastolic congestive heart failure ([place]) 07/24/2016  . Cellulitis of right arm 07/20/2016  . Cellulitis and abscess of hand 07/20/2016  . Venous stasis ulcers ([place]) 12/24/2015  . Chronic venous insufficiency 12/24/2015  . Atherosclerosis of extremity with ulceration ([place]) 12/24/2015  . CHF (congestive heart failure) ([place]) 12/24/2015  . Critical lower limb ischemia 12/24/2015  . Obesity 12/24/2015  . PAF (paroxysmal atrial fibrillation) ([place]) 12/24/2015  . PAD (peripheral artery disease) ([place]) 06/07/2014  . COPD (chronic obstructive pulmonary disease) ([place]) 01/23/2013  . Anemia 08/03/2012  . Essential hypertension, benign 08/03/2012  . Mixed hyperlipidemia 08/03/2012  . Hx of CABG 08/03/2012    Rayetta Humphrey, PT CLT [phone] 05-30-2017, 9:36 AM  [place] [address] [place], [place], [address] Phone: [phone]   Fax:  [phone]  Name: ARNALDO HEFFRON MRN: [id number] Date of Birth: 1936/10/18  PHYSICAL THERAPY DISCHARGE SUMMARY  Visits from Start of Care: 10  Current functional level related to goals / functional outcomes: As above   Remaining deficits: As above   Education / Equipment: HEP Plan: Patient agrees to discharge.  Patient goals were met. Patient is being discharged due to meeting the stated rehab goals.  ?????       Rayetta Humphrey, [place] CLT [phone]

## 2017-05-12 ENCOUNTER — Encounter (INDEPENDENT_AMBULATORY_CARE_PROVIDER_SITE_OTHER): Payer: Self-pay

## 2017-05-12 ENCOUNTER — Ambulatory Visit (INDEPENDENT_AMBULATORY_CARE_PROVIDER_SITE_OTHER): Payer: Medicare Other | Admitting: Internal Medicine

## 2017-05-12 ENCOUNTER — Encounter (INDEPENDENT_AMBULATORY_CARE_PROVIDER_SITE_OTHER): Payer: Self-pay | Admitting: Internal Medicine

## 2017-05-12 VITALS — BP 122/70 | HR 72 | Temp 97.0°F | Resp 18 | Ht 69.0 in | Wt 211.0 lb

## 2017-05-12 DIAGNOSIS — I2510 Atherosclerotic heart disease of native coronary artery without angina pectoris: Secondary | ICD-10-CM | POA: Diagnosis not present

## 2017-05-12 DIAGNOSIS — D500 Iron deficiency anemia secondary to blood loss (chronic): Secondary | ICD-10-CM | POA: Diagnosis not present

## 2017-05-12 LAB — CBC WITH DIFFERENTIAL/PLATELET
BASOS ABS: 0 {cells}/uL (ref 0–200)
Basophils Relative: 0 %
Eosinophils Absolute: 182 cells/uL (ref 15–500)
Eosinophils Relative: 2 %
HCT: 37.1 % — ABNORMAL LOW (ref 38.5–50.0)
Hemoglobin: 11.2 g/dL — ABNORMAL LOW (ref 13.2–17.1)
LYMPHS PCT: 15 %
Lymphs Abs: 1365 cells/uL (ref 850–3900)
MCH: 26.1 pg — AB (ref 27.0–33.0)
MCHC: 30.2 g/dL — AB (ref 32.0–36.0)
MCV: 86.5 fL (ref 80.0–100.0)
MPV: 9.7 fL (ref 7.5–12.5)
Monocytes Absolute: 910 cells/uL (ref 200–950)
Monocytes Relative: 10 %
NEUTROS PCT: 73 %
Neutro Abs: 6643 cells/uL (ref 1500–7800)
Platelets: 288 10*3/uL (ref 140–400)
RBC: 4.29 MIL/uL (ref 4.20–5.80)
RDW: 17.6 % — AB (ref 11.0–15.0)
WBC: 9.1 10*3/uL (ref 3.8–10.8)

## 2017-05-12 NOTE — Progress Notes (Addendum)
Subjective:    Patient ID: John Sampson, male    DOB: [birth year], 81 y.o.   MRN: [id number]  HPI  Here today for f/u. Underwent an EGD in May for melena and anemia.  [facility] in [facility] for melena and anemia. Underwent an EGD (see below). He denies see any black stools. Is not on iron at this time. Maintained on Eliquis for atrial fib. Appetite is okay.  He feels tired.  His Hemoglobin is up to 10.6 as of 04/27/2017.  Has a BM every other day.  CBC Latest Ref Rng & Units 04/27/2017 04/01/2017 03/31/2017  WBC 4.0 - 10.5 K/uL 8.8 7.5 -  Hemoglobin 13.0 - 17.0 g/dL 10.6(L) 10.1(L) 9.5(L)  Hematocrit 39.0 - 52.0 % 35.4(L) 33.8(L) 31.4(L)  Platelets 150 - 400 K/uL 298 264 -    04/11/2017 H and H  9.2 and 31.4  03/31/2017 EGD: Melena and anemia: Impression:               - Normal esophagus.                           - Z-line regular, 42 cm from the incisors.                           - Non-bleeding gastric ulcers with no stigmata of                            bleeding.                           - Scars in the prepyloric region of the stomach.                           - A few gastric polyps.                           - Normal duodenal bulb.                           - Duodenal diverticulum.                           - Duodenal lipoma.                           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 Treated with argon plasma coagulation                            ([facility]).                           - No specimens collected.  01/23/2013)Given Capsule: IDA and heme positive stools  Summary & Recommendation; No lesions noted to small bowel mucosa to account for patient's occult GI bleed. Small amount of black material coating antral mucosa which may or may not be coffee-ground material. Patient is not  taking iron  pills. Will repeat patient's H&H later this week and determine if if he should undergo repeat EGD(last one in September  2013).   08/18/2012 Colonoscopy/EGD: IDA, heme positive stools  Impression:  Multiple diverticula at sigmoid and a few at ascending colon. Two small polyps ablated via cold biopsy(cecum and splenic flexure). Two rectal polyps were snared. Larger one was 8 mm. Two gastric ulcers at antrum with scarring and deformity. Suspect chronic peptic ulcer disease.     Review of Systems Past Medical History:  Diagnosis Date  . Atrial flutter ([place])   . AV malformation of GI tract   . Cancer ([place])    Thyroid  . COPD (chronic obstructive pulmonary disease) ([place]) 3.12.14   2D Echo, EF 50-55%  . Coronary atherosclerosis of native coronary artery    Multivessel status post CABG  . Gastric ulcer    Small - nonbleeding  . GERD (gastroesophageal reflux disease)   . Headache(784.0)   . Iron deficiency anemia    Negative Givens capsule study   . Myocardial infarction ([place])   . PAD (peripheral artery disease) (H[facility])    Moderate bilateral SFA disease at angiography 01/2013  . Pituitary macroadenoma [facility])     Past Surgical History:  Procedure Laterality Date  . ABDOMINAL AORTAGRAM N/A 02/19/2013   Procedure: ABDOMINAL Maxcine Ham;  Surgeon: [name], MD;  Location: [facility] CATH LAB;  Service: Cardiovascular;  Laterality: N/A;  . CARDIAC CATHETERIZATION    . CARDIOVERSION N/A 06/16/2015   Procedure: CARDIOVERSION;  Surgeon: Satira Sark, MD;  [facility] ORS;  Service: Cardiovascular;  Laterality: N/A;  . COLONOSCOPY  08/18/2012   Procedure: COLONOSCOPY;  Surgeon: [name], MD;  [facility] ENDO [facility];  Service: Endoscopy;  Laterality: N/A;  1:25  . CORONARY ARTERY BYPASS GRAFT  2003   5 grafts - details not clear  . CRANIOTOMY  12/31/2011   Procedure: CRANIOTOMY HYPOPHYSECTOMY TRANSNASAL APPROACH;  Surgeon: [name], MD;  Location: [place] NEURO ORS;  Service: Neurosurgery;  Laterality: N/A;  Transphenoidal Hypophysectomy With Fat Graft Harvest from right abdomen   .  ESOPHAGOGASTRODUODENOSCOPY  08/18/2012   Procedure: ESOPHAGOGASTRODUODENOSCOPY (EGD);  Surgeon: [name], MD;  [facility];  Service: Endoscopy;  Laterality: N/A;  . ESOPHAGOGASTRODUODENOSCOPY N/A 03/31/2017   Procedure: ESOPHAGOGASTRODUODENOSCOPY (EGD);  Surgeon: [name], MD;  [facility];  Service: Endoscopy;  Laterality: N/A;  . EYE SURGERY  2012  . GIVENS CAPSULE STUDY N/A 01/25/2013   Procedure: GIVENS CAPSULE STUDY;  Surgeon: [name], MD;  [facility];  Service: Endoscopy;  Laterality: N/A;  730  . HOT HEMOSTASIS  03/31/2017   Procedure: HOT HEMOSTASIS (ARGON PLASMA COAGULATION/BICAP);  Surgeon: [name], MD;  [facility];  Service: Endoscopy;;  duodenum  . LARYNGOPLASTY Left 09/14/2016   Procedure: LARYNGOPLASTY;  Surgeon: [name], MD;  Location: [place];  Service: ENT;  Laterality: Left;  . LOWER EXTREMITY ANGIOGRAM N/A 02/19/2013   Procedure: LOWER EXTREMITY ANGIOGRAM;  Surgeon: [name], MD;  Location: [facility] CATH LAB;  Service: Cardiovascular;  Laterality: N/A;  . PERIPHERAL VASCULAR CATHETERIZATION N/A 01/19/2016   Procedure: Abdominal Aortogram;  Surgeon: Conrad [place], MD;  Location: [place] CV LAB;  Service: Cardiovascular;  Laterality: N/A;  . PERIPHERAL VASCULAR CATHETERIZATION Bilateral 01/19/2016   Procedure: Lower Extremity Angiography;  Surgeon: Conrad [place], MD;  Location: [place] CV LAB;  Service: Cardiovascular;  Laterality: Bilateral;  . PV  Angiogram  02/19/13   Indications: slow healing left calf ulcer  . Stress Myocardial Perfusion  12/08/2011   Indications: Abnormal EKG, Eval of prior GABG  . TEE WITHOUT CARDIOVERSION N/A 06/16/2015   Procedure: TRANSESOPHAGEAL ECHOCARDIOGRAM (TEE);  Surgeon: Satira Sark, MD;  Location[facility] ORS;  Service: Cardiovascular;  Laterality: N/A;  . THYROIDECTOMY N/A 09/14/2016   Procedure: TOTAL THYROIDECTOMY;  Surgeon: [name], MD;   Location: [place];  Service: ENT;  Laterality: N/A;  . THYROIDECTOMY    . TONSILLECTOMY  Age 14    Allergies  Allergen Reactions  . No Known Allergies     Current Outpatient Prescriptions on File Prior to Visit  Medication Sig Dispense Refill  . acetaminophen (TYLENOL) 325 MG tablet Take 650 mg by mouth daily as needed for headache.    . albuterol (PROVENTIL HFA) 108 (90 BASE) MCG/ACT inhaler Inhale 2 puffs into the lungs every 6 (six) hours as needed for wheezing or shortness of breath. 1 Inhaler 3  . apixaban (ELIQUIS) 5 MG TABS tablet Take 1 tablet (5 mg total) by mouth 2 (two) times daily. Hold until Monday then start 1 tablet twice a day as before 60 tablet 3  . diltiazem (CARDIZEM CD) 240 MG 24 hr capsule Take 1 capsule (240 mg total) by mouth daily. 90 capsule 3  . fish oil-omega-3 fatty acids 1000 MG capsule Take 1 g by mouth 2 (two) times daily.     . folic acid (FOLVITE) 1 MG tablet Take 1 mg by mouth daily.     . furosemide (LASIX) 20 MG tablet Take 1 tablet (20 mg total) by mouth daily. (Patient taking differently: Take 20 mg by mouth daily as needed for fluid. ) 90 tablet 3  . levothyroxine (SYNTHROID, LEVOTHROID) 150 MCG tablet Take 150 mcg by mouth daily before breakfast.    . lisinopril (PRINIVIL,ZESTRIL) 10 MG tablet Take 1 tablet (10 mg total) by mouth daily. 90 tablet 3  . metoprolol succinate (TOPROL-XL) 50 MG 24 hr tablet Take 50 mg by mouth 2 (two) times daily. Take with or immediately following a meal.    . nitroGLYCERIN (NITROSTAT) 0.4 MG SL tablet Place 1 tablet (0.4 mg total) under the tongue every 5 (five) minutes as needed for chest pain. 30 tablet 9  . omeprazole (PRILOSEC) 20 MG capsule Take 20 mg by mouth 2 (two) times daily before a meal.    . potassium chloride SA (K-DUR,KLOR-CON) 20 MEQ tablet Take 20 mEq by mouth daily.    . simvastatin (ZOCOR) 40 MG tablet Take 0.5 tablets (20 mg total) by mouth at bedtime. 30 tablet 12   No current facility-administered  medications on file prior to visit.         Objective:   Physical Exam Blood pressure 122/70, pulse 72, temperature 97 F (36.1 C), temperature source Oral, resp. rate 18, height 5\' 9"  (1.753 m), weight 211 lb (95.7 kg).  Alert and oriented. Skin warm and dry. Oral mucosa is moist.   . Sclera anicteric, conjunctivae is pink. Thyroid not enlarged. No cervical lymphadenopathy. Lungs clear. Heart regular rate and rhythm.  Abdomen is soft. Bowel sounds are positive. No hepatomegaly. No abdominal masses felt. No tenderness.  No edema to lower extremities.          Assessment & Plan:  Chronic GI blood loss.  Hemoglobin actually coming up. CBC today;.  OV in 4 months.

## 2017-05-12 NOTE — Patient Instructions (Signed)
CBC. If u see any black stools, go directly to the ED

## 2017-05-16 ENCOUNTER — Ambulatory Visit (HOSPITAL_COMMUNITY): Payer: Medicare Other | Admitting: Physical Therapy

## 2017-05-16 ENCOUNTER — Other Ambulatory Visit (INDEPENDENT_AMBULATORY_CARE_PROVIDER_SITE_OTHER): Payer: Self-pay | Admitting: *Deleted

## 2017-05-16 DIAGNOSIS — K922 Gastrointestinal hemorrhage, unspecified: Secondary | ICD-10-CM

## 2017-05-18 ENCOUNTER — Ambulatory Visit (HOSPITAL_COMMUNITY): Payer: Medicare Other | Admitting: Physical Therapy

## 2017-05-23 ENCOUNTER — Ambulatory Visit (HOSPITAL_COMMUNITY): Payer: Medicare Other | Admitting: Physical Therapy

## 2017-05-25 ENCOUNTER — Ambulatory Visit (HOSPITAL_COMMUNITY): Payer: Medicare Other

## 2017-05-25 DIAGNOSIS — I2510 Atherosclerotic heart disease of native coronary artery without angina pectoris: Secondary | ICD-10-CM | POA: Diagnosis not present

## 2017-05-25 DIAGNOSIS — J449 Chronic obstructive pulmonary disease, unspecified: Secondary | ICD-10-CM | POA: Diagnosis not present

## 2017-05-25 DIAGNOSIS — D509 Iron deficiency anemia, unspecified: Secondary | ICD-10-CM | POA: Diagnosis not present

## 2017-05-25 DIAGNOSIS — I4891 Unspecified atrial fibrillation: Secondary | ICD-10-CM | POA: Diagnosis not present

## 2017-06-06 DIAGNOSIS — M79672 Pain in left foot: Secondary | ICD-10-CM | POA: Diagnosis not present

## 2017-06-06 DIAGNOSIS — M79671 Pain in right foot: Secondary | ICD-10-CM | POA: Diagnosis not present

## 2017-06-06 DIAGNOSIS — M25579 Pain in unspecified ankle and joints of unspecified foot: Secondary | ICD-10-CM | POA: Diagnosis not present

## 2017-06-20 DIAGNOSIS — E1140 Type 2 diabetes mellitus with diabetic neuropathy, unspecified: Secondary | ICD-10-CM | POA: Diagnosis not present

## 2017-06-20 DIAGNOSIS — E1151 Type 2 diabetes mellitus with diabetic peripheral angiopathy without gangrene: Secondary | ICD-10-CM | POA: Diagnosis not present

## 2017-06-27 ENCOUNTER — Other Ambulatory Visit (INDEPENDENT_AMBULATORY_CARE_PROVIDER_SITE_OTHER): Payer: Self-pay | Admitting: *Deleted

## 2017-06-27 ENCOUNTER — Encounter (INDEPENDENT_AMBULATORY_CARE_PROVIDER_SITE_OTHER): Payer: Self-pay | Admitting: *Deleted

## 2017-06-27 DIAGNOSIS — K922 Gastrointestinal hemorrhage, unspecified: Secondary | ICD-10-CM

## 2017-07-04 DIAGNOSIS — E890 Postprocedural hypothyroidism: Secondary | ICD-10-CM | POA: Diagnosis not present

## 2017-07-04 DIAGNOSIS — C73 Malignant neoplasm of thyroid gland: Secondary | ICD-10-CM | POA: Diagnosis not present

## 2017-07-11 DIAGNOSIS — M79672 Pain in left foot: Secondary | ICD-10-CM | POA: Diagnosis not present

## 2017-07-11 DIAGNOSIS — M25579 Pain in unspecified ankle and joints of unspecified foot: Secondary | ICD-10-CM | POA: Diagnosis not present

## 2017-07-11 DIAGNOSIS — K922 Gastrointestinal hemorrhage, unspecified: Secondary | ICD-10-CM | POA: Diagnosis not present

## 2017-07-11 DIAGNOSIS — M79671 Pain in right foot: Secondary | ICD-10-CM | POA: Diagnosis not present

## 2017-07-11 LAB — CBC
HEMATOCRIT: 32 % — AB (ref 38.5–50.0)
Hemoglobin: 9.8 g/dL — ABNORMAL LOW (ref 13.2–17.1)
MCH: 26.8 pg — ABNORMAL LOW (ref 27.0–33.0)
MCHC: 30.6 g/dL — ABNORMAL LOW (ref 32.0–36.0)
MCV: 87.7 fL (ref 80.0–100.0)
MPV: 9.5 fL (ref 7.5–12.5)
Platelets: 319 10*3/uL (ref 140–400)
RBC: 3.65 MIL/uL — ABNORMAL LOW (ref 4.20–5.80)
RDW: 17.2 % — AB (ref 11.0–15.0)
WBC: 6.3 10*3/uL (ref 3.8–10.8)

## 2017-07-12 ENCOUNTER — Encounter (INDEPENDENT_AMBULATORY_CARE_PROVIDER_SITE_OTHER): Payer: Self-pay | Admitting: *Deleted

## 2017-07-12 ENCOUNTER — Telehealth (INDEPENDENT_AMBULATORY_CARE_PROVIDER_SITE_OTHER): Payer: Self-pay | Admitting: Internal Medicine

## 2017-07-12 ENCOUNTER — Other Ambulatory Visit (INDEPENDENT_AMBULATORY_CARE_PROVIDER_SITE_OTHER): Payer: Self-pay | Admitting: *Deleted

## 2017-07-12 DIAGNOSIS — D649 Anemia, unspecified: Secondary | ICD-10-CM

## 2017-07-12 NOTE — Telephone Encounter (Signed)
CBC noted for 1 week. The patient has been sent a letter as a reminder.

## 2017-07-12 NOTE — Telephone Encounter (Signed)
Tammy, CBC in 1 week 

## 2017-07-12 NOTE — Telephone Encounter (Signed)
Patient called at 4:45pm yesterday and stated that he had just come in, stated that he had blood work done yesterday morning and he thinks it's an emergency.  He would like to speak to [place].  [phone]

## 2017-07-12 NOTE — Telephone Encounter (Signed)
I have spoken with patient. He will come by office for 3 stool cards and CBC in 1 week.

## 2017-07-18 ENCOUNTER — Telehealth (INDEPENDENT_AMBULATORY_CARE_PROVIDER_SITE_OTHER): Payer: Self-pay | Admitting: *Deleted

## 2017-07-18 NOTE — Telephone Encounter (Signed)
   Diagnosis:    Result(s)   Card 1: Positive: 07/13/2017    Card 2:Negative: 07/14/2017   Card 3: Positive: 07/16/2017    Completed by: Thomas Hoff ,LPN   HEMOCCULT SENSA DEVELOPER: LOT#:  [id number] EXPIRATION DATE: 2020-18   HEMOCCULT SENSA CARD:  LOT#:  [id number] EXPIRATION DATE: 03/20   CARD CONTROL RESULTS:  POSITIVE: Positive NEGATIVE: Negative    ADDITIONAL COMMENTS:  Forwarded to Deberah Castle, NP, ordering provider.

## 2017-07-18 NOTE — Telephone Encounter (Signed)
Results given to patient. CBC tomorrow. I have spoken with patient

## 2017-07-19 DIAGNOSIS — D649 Anemia, unspecified: Secondary | ICD-10-CM | POA: Diagnosis not present

## 2017-07-19 LAB — CBC
HCT: 32.8 % — ABNORMAL LOW (ref 38.5–50.0)
HEMOGLOBIN: 9.9 g/dL — AB (ref 13.2–17.1)
MCH: 26.3 pg — ABNORMAL LOW (ref 27.0–33.0)
MCHC: 30.2 g/dL — AB (ref 32.0–36.0)
MCV: 87.2 fL (ref 80.0–100.0)
MPV: 9.8 fL (ref 7.5–12.5)
PLATELETS: 278 10*3/uL (ref 140–400)
RBC: 3.76 MIL/uL — ABNORMAL LOW (ref 4.20–5.80)
RDW: 16.4 % — AB (ref 11.0–15.0)
WBC: 6 10*3/uL (ref 3.8–10.8)

## 2017-07-20 ENCOUNTER — Encounter (INDEPENDENT_AMBULATORY_CARE_PROVIDER_SITE_OTHER): Payer: Self-pay | Admitting: *Deleted

## 2017-07-20 ENCOUNTER — Other Ambulatory Visit (INDEPENDENT_AMBULATORY_CARE_PROVIDER_SITE_OTHER): Payer: Self-pay | Admitting: *Deleted

## 2017-07-20 DIAGNOSIS — D649 Anemia, unspecified: Secondary | ICD-10-CM

## 2017-07-29 ENCOUNTER — Telehealth: Payer: Self-pay

## 2017-07-29 NOTE — Telephone Encounter (Signed)
LM on home phone that Eliquis is here at the office

## 2017-08-03 DIAGNOSIS — D649 Anemia, unspecified: Secondary | ICD-10-CM | POA: Diagnosis not present

## 2017-08-03 LAB — CBC
HCT: 29.8 % — ABNORMAL LOW (ref 38.5–50.0)
Hemoglobin: 9.1 g/dL — ABNORMAL LOW (ref 13.2–17.1)
MCH: 26.6 pg — ABNORMAL LOW (ref 27.0–33.0)
MCHC: 30.5 g/dL — ABNORMAL LOW (ref 32.0–36.0)
MCV: 87.1 fL (ref 80.0–100.0)
MPV: 10.6 fL (ref 7.5–12.5)
PLATELETS: 242 10*3/uL (ref 140–400)
RBC: 3.42 10*6/uL — ABNORMAL LOW (ref 4.20–5.80)
RDW: 14.1 % (ref 11.0–15.0)
WBC: 8.6 10*3/uL (ref 3.8–10.8)

## 2017-08-08 ENCOUNTER — Encounter (INDEPENDENT_AMBULATORY_CARE_PROVIDER_SITE_OTHER): Payer: Self-pay | Admitting: *Deleted

## 2017-08-08 ENCOUNTER — Other Ambulatory Visit (INDEPENDENT_AMBULATORY_CARE_PROVIDER_SITE_OTHER): Payer: Self-pay | Admitting: *Deleted

## 2017-08-08 DIAGNOSIS — M25579 Pain in unspecified ankle and joints of unspecified foot: Secondary | ICD-10-CM | POA: Diagnosis not present

## 2017-08-08 DIAGNOSIS — M79672 Pain in left foot: Secondary | ICD-10-CM | POA: Diagnosis not present

## 2017-08-08 DIAGNOSIS — D508 Other iron deficiency anemias: Secondary | ICD-10-CM

## 2017-08-08 DIAGNOSIS — M79671 Pain in right foot: Secondary | ICD-10-CM | POA: Diagnosis not present

## 2017-08-08 NOTE — Progress Notes (Signed)
Cardiology Office Note  Date: 08/09/2017   ID: KELSO BIBBY, DOB 07/04/1936, MRN [id number]  PCP: Sinda Du, MD  Primary Cardiologist: Rozann Lesches, MD   Chief Complaint  Patient presents with  . Coronary Artery Disease  . Atrial Flutter    History of Present Illness: John Sampson is a 81 y.o. male last seen in January. He presents for a routine follow-up visit. Since last encounter he reports no chest pain or palpitations. He states that he has been compliant with his medications. Try to stay busy around his house, walks some for exercise.   He is following with GI with history of heme positive stools and anemia. Recent serial CBCs noted. He does not endorse any obvious hematochezia or visual blood in his stools. He has been able to stay on Eliquis so far.  He is due for follow-up carotid Dopplers.  Last ischemic testing was in 2013. He has been comfortable with observation in the absence of any new angina symptoms or unusual shortness of breath.  Past Medical History:  Diagnosis Date  . Atrial flutter ([place])   . AV malformation of GI tract   . Cancer ([place])    Thyroid  . COPD (chronic obstructive pulmonary disease) ([place]) 3.12.14   2D Echo, EF 50-55%  . Coronary atherosclerosis of native coronary artery    Multivessel status post CABG  . Gastric ulcer    Small - nonbleeding  . GERD (gastroesophageal reflux disease)   . Headache(784.0)   . Iron deficiency anemia    Negative Givens capsule study   . Myocardial infarction ([place])   . PAD (peripheral artery disease) ([facility])    Moderate bilateral SFA disease at angiography 01/2013  . Pituitary macroadenoma [facility])     Past Surgical History:  Procedure Laterality Date  . ABDOMINAL AORTAGRAM N/A 02/19/2013   Procedure: ABDOMINAL Maxcine Ham;  Surgeon: [name], MD;  Location: [facility] CATH LAB;  Service: Cardiovascular;  Laterality: N/A;  . CARDIAC CATHETERIZATION    . CARDIOVERSION N/A 06/16/2015   Procedure:  CARDIOVERSION;  Surgeon: Satira Sark, MD;  Location: [facility] ORS;  Service: Cardiovascular;  Laterality: N/A;  . COLONOSCOPY  08/18/2012   Procedure: COLONOSCOPY;  Surgeon: [name], MD;  Location: [facility] ENDO SUITE;  Service: Endoscopy;  Laterality: N/A;  1:25  . CORONARY ARTERY BYPASS GRAFT  2003   5 grafts - details not clear  . CRANIOTOMY  12/31/2011   Procedure: CRANIOTOMY HYPOPHYSECTOMY TRANSNASAL APPROACH;  Surgeon: [name], MD;  Location: [place] NEURO ORS;  Service: Neurosurgery;  Laterality: N/A;  Transphenoidal Hypophysectomy With Fat Graft Harvest from right abdomen   . ESOPHAGOGASTRODUODENOSCOPY  08/18/2012   Procedure: ESOPHAGOGASTRODUODENOSCOPY (EGD);  Surgeon: [name], MD;  [facility];  Service: Endoscopy;  Laterality: N/A;  . ESOPHAGOGASTRODUODENOSCOPY N/A 03/31/2017   Procedure: ESOPHAGOGASTRODUODENOSCOPY (EGD);  Surgeon: [name], MD;  [facility];  Service: Endoscopy;  Laterality: N/A;  . EYE SURGERY  2012  . GIVENS CAPSULE STUDY N/A 01/25/2013   Procedure: GIVENS CAPSULE STUDY;  Surgeon: [name], MD;  [facility];  Service: Endoscopy;  Laterality: N/A;  730  . HOT HEMOSTASIS  03/31/2017   Procedure: HOT HEMOSTASIS (ARGON PLASMA COAGULATION/BICAP);  Surgeon: [name], MD;  [facility] END[facility];  Service: Endoscopy;;  duodenum  . LARYNGOPLASTY Left 09/14/2016   Procedure: LARYNGOPLASTY;  Surgeon: [name], MD;  Location: [place];  Service: ENT;  Laterality: Left;  . LOWER EXTREMITY ANGIOGRAM N/A 02/19/2013   Procedure: LOWER EXTREMITY ANGIOGRAM;  Surgeon: [name], MD;  Location: [facility] CATH LAB;  Service: Cardiovascular;  Laterality: N/A;  . PERIPHERAL VASCULAR CATHETERIZATION N/A 01/19/2016   Procedure: Abdominal Aortogram;  Surgeon: Conrad Custer, MD;  Location: [place] CV LAB;  Service: Cardiovascular;  Laterality: N/A;  . PERIPHERAL VASCULAR CATHETERIZATION Bilateral 01/19/2016   Procedure:  Lower Extremity Angiography;  Surgeon: Conrad Cadwell, MD;  Location: [place] CV LAB;  Service: Cardiovascular;  Laterality: Bilateral;  . PV Angiogram  02/19/13   Indications: slow healing left calf ulcer  . Stress Myocardial Perfusion  12/08/2011   Indications: Abnormal EKG, Eval of prior GABG  . TEE WITHOUT CARDIOVERSION N/A 06/16/2015   Procedure: TRANSESOPHAGEAL ECHOCARDIOGRAM (TEE);  Surgeon: Satira Sark, MD;  Location: [facility]S;  Service: Cardiovascular;  Laterality: N/A;  . THYROIDECTOMY N/A 09/14/2016   Procedure: TOTAL THYROIDECTOMY;  Surgeon: [name], MD;  Location: [place];  Service: ENT;  Laterality: N/A;  . THYROIDECTOMY    . TONSILLECTOMY  Age 30    Current Outpatient Prescriptions  Medication Sig Dispense Refill  . acetaminophen (TYLENOL) 325 MG tablet Take 650 mg by mouth daily as needed for headache.    . albuterol (PROVENTIL HFA) 108 (90 BASE) MCG/ACT inhaler Inhale 2 puffs into the lungs every 6 (six) hours as needed for wheezing or shortness of breath. 1 Inhaler 3  . apixaban (ELIQUIS) 5 MG TABS tablet Take 1 tablet (5 mg total) by mouth 2 (two) times daily. Hold until Monday then start 1 tablet twice a day as before 60 tablet 3  . diltiazem (CARDIZEM CD) 240 MG 24 hr capsule Take 1 capsule (240 mg total) by mouth daily. 90 capsule 3  . fish oil-omega-3 fatty acids 1000 MG capsule Take 1 g by mouth 2 (two) times daily.     . folic acid (FOLVITE) 1 MG tablet Take 1 mg by mouth daily.     . furosemide (LASIX) 20 MG tablet Take 1 tablet (20 mg total) by mouth daily. (Patient taking differently: Take 20 mg by mouth daily as needed for fluid. ) 90 tablet 3  . levothyroxine (SYNTHROID, LEVOTHROID) 150 MCG tablet Take 135 mcg by mouth daily before breakfast.     . lisinopril (PRINIVIL,ZESTRIL) 10 MG tablet Take 1 tablet (10 mg total) by mouth daily. 90 tablet 3  . metoprolol succinate (TOPROL-XL) 50 MG 24 hr tablet Take 50 mg by mouth 2 (two) times daily. Take with  or immediately following a meal.    . nitroGLYCERIN (NITROSTAT) 0.4 MG SL tablet Place 1 tablet (0.4 mg total) under the tongue every 5 (five) minutes as needed for chest pain. 30 tablet 9  . omeprazole (PRILOSEC) 20 MG capsule Take 20 mg by mouth 2 (two) times daily before a meal.    . potassium chloride SA (K-DUR,KLOR-CON) 20 MEQ tablet Take 20 mEq by mouth daily.    . simvastatin (ZOCOR) 40 MG tablet Take 0.5 tablets (20 mg total) by mouth at bedtime. 30 tablet 12   No current facility-administered medications for this visit.    Allergies:  No known allergies   Social History: The patient  reports that he quit smoking about 15 years ago. His smoking use included Cigarettes. He started smoking about 62 years ago. He has a 135.00 pack-year smoking history. He has never used smokeless tobacco. He reports that he does not drink alcohol or  use drugs.   ROS:  Please see the history of present illness. Otherwise, complete review of systems is positive for arthritic stiffness.  All other systems are reviewed and negative.   Physical Exam: VS:  BP 140/72 (BP Location: Left Arm)   Pulse 67   Ht _0  (1.753 m)   Wt 209 lb (94.8 kg)   SpO2 91%   BMI 30.86 kg/m , BMI Body mass index is 30.86 kg/m.  Wt Readings from Last 3 Encounters:  08/09/17 209 lb (94.8 kg)  05/12/17 211 lb (95.7 kg)  04/27/17 210 lb (95.3 kg)    General: Overweight male, appears comfortable at rest. HEENT: Conjunctiva and lids normal, oropharynx clear. Neck: Supple, no elevated JVP or carotid bruits, no thyromegaly. Lungs: Clear to auscultation, nonlabored breathing at rest. Cardiac: Regular rate and rhythm with ectopy, no S3 or significant systolic murmur, no pericardial rub. Abdomen: Soft, nontender, protuberant, bowel sounds present, no guarding or rebound. Extremities: Mild leg edema , distal pulses 1-2, left greater than right +. Skin: Warm and dry. Musculoskeletal: No kyphosis. Neuropsychiatric: Alert and  oriented 3, affect appropriate.  ECG: I personally reviewed the tracing from 04/27/2017 which showed sinus rhythm with increased voltage and PVC.  Recent Labwork: 03/30/2017: TSH 0.013 04/27/2017: ALT 18; AST 23; BUN 13; Creatinine, Ser 0.87; Potassium 3.9; Sodium 138 08/03/2017: Hemoglobin 9.1; Platelets 242   Other Studies Reviewed Today:  Echocardiogram 01/01/2016: Study Conclusions  - Left ventricle: The cavity size was normal. Wall thickness was increased in a pattern of moderate LVH. The estimated ejection fraction was 55%. Probable hypokinesis of the basalinferior myocardium. Doppler parameters are consistent with restrictive physiology, indicative of decreased left ventricular diastolic compliance and/or increased left atrial pressure. - Aortic valve: Mildly calcified annulus. Trileaflet; mildly calcified leaflets. - Mitral valve: Calcified annulus. There was mild regurgitation. - Left atrium: The atrium was severely dilated. - Right atrium: Central venous pressure (est): 3 mm Hg. - Tricuspid valve: There was mild regurgitation. - Pulmonary arteries: Systolic pressure was moderately increased. PA peak pressure: 53 mm Hg (S). - Pericardium, extracardiac: There was no pericardial effusion.  Impressions:  - Moderate LVH with LVEF approximately 55% and basal inferior hypokinesis. Restrictive diastolic filling pattern is noted with evidence of increased LV filling pressure. severe left atrial enlargement. MAC with mild mitral regurgitation. Sclerotic aortic valve without stenosis. Mild tricuspid regurgitation with evidence of moderate pulmonary hypertension, PASP 53 mmHg.  Lexiscan Myoview 12/08/2011 [facility]): Moderate-sized inferolateral scar with mild peri-infarct ischemia, LVEF 43%.  Assessment and Plan:  1. Multivessel CAD status post CABG in 2003. He underwent ischemic testing in 2013 and we are following him on medical therapy at this point in the  absence of progressive angina or shortness of breath.  2. Carotid artery disease, asymptomatic. He is due for follow-up carotid Dopplers. Continue statin therapy.  3. Paroxysmal atrial flutter, no palpitations reported. He continues on Cardizem CD and has been tolerating Eliquis. I reviewed his most recent lab work.  4. Hyperlipidemia, remains on Zocor. He is following up with Dr. Luan Pulling.  Current medicines were reviewed with the patient today.   Orders Placed This Encounter  Procedures  . US Carotid Duplex Bilateral    Disposition: Follow-up in 6 months.  Signed, Satira Sark, MD, [facility] 08/09/2017 9:45 AM    [facility] [address] [place], [place] [address] Phone: [phone]; Fax: [phone]

## 2017-08-09 ENCOUNTER — Ambulatory Visit (INDEPENDENT_AMBULATORY_CARE_PROVIDER_SITE_OTHER): Payer: Medicare Other | Admitting: Cardiology

## 2017-08-09 ENCOUNTER — Encounter: Payer: Self-pay | Admitting: Cardiology

## 2017-08-09 VITALS — BP 140/72 | HR 67 | Ht 69.0 in | Wt 209.0 lb

## 2017-08-09 DIAGNOSIS — E782 Mixed hyperlipidemia: Secondary | ICD-10-CM

## 2017-08-09 DIAGNOSIS — I779 Disorder of arteries and arterioles, unspecified: Secondary | ICD-10-CM | POA: Diagnosis not present

## 2017-08-09 DIAGNOSIS — I739 Peripheral vascular disease, unspecified: Secondary | ICD-10-CM

## 2017-08-09 DIAGNOSIS — I2510 Atherosclerotic heart disease of native coronary artery without angina pectoris: Secondary | ICD-10-CM | POA: Diagnosis not present

## 2017-08-09 DIAGNOSIS — I483 Typical atrial flutter: Secondary | ICD-10-CM

## 2017-08-09 NOTE — Patient Instructions (Signed)
Your physician wants you to follow-up in:  6 months with Dr Ferne Reus will receive a reminder letter in the mail two months in advance. If you don't receive a letter, please call our office to schedule the follow-up appointment.   Your physician has requested that you have a carotid duplex. This test is an ultrasound of the carotid arteries in your neck. It looks at blood flow through these arteries that supply the brain with blood. Allow one hour for this exam. There are no restrictions or special instructions.     Your physician recommends that you continue on your current medications as directed. Please refer to the Current Medication list given to you today.    If you need a refill on your cardiac medications before your next appointment, please call your pharmacy.     No lab work due this visit.       Thank you for choosing [facility] !

## 2017-08-17 DIAGNOSIS — C73 Malignant neoplasm of thyroid gland: Secondary | ICD-10-CM | POA: Diagnosis not present

## 2017-08-22 DIAGNOSIS — D508 Other iron deficiency anemias: Secondary | ICD-10-CM | POA: Diagnosis not present

## 2017-08-22 LAB — CBC
HCT: 28.1 % — ABNORMAL LOW (ref 38.5–50.0)
HEMOGLOBIN: 8.5 g/dL — AB (ref 13.2–17.1)
MCH: 25.5 pg — AB (ref 27.0–33.0)
MCHC: 30.2 g/dL — AB (ref 32.0–36.0)
MCV: 84.4 fL (ref 80.0–100.0)
MPV: 10.7 fL (ref 7.5–12.5)
Platelets: 287 10*3/uL (ref 140–400)
RBC: 3.33 10*6/uL — ABNORMAL LOW (ref 4.20–5.80)
RDW: 13.4 % (ref 11.0–15.0)
WBC: 8 10*3/uL (ref 3.8–10.8)

## 2017-08-23 ENCOUNTER — Telehealth (INDEPENDENT_AMBULATORY_CARE_PROVIDER_SITE_OTHER): Payer: Self-pay | Admitting: Internal Medicine

## 2017-08-23 ENCOUNTER — Other Ambulatory Visit (INDEPENDENT_AMBULATORY_CARE_PROVIDER_SITE_OTHER): Payer: Self-pay | Admitting: Internal Medicine

## 2017-08-23 DIAGNOSIS — D500 Iron deficiency anemia secondary to blood loss (chronic): Secondary | ICD-10-CM

## 2017-08-23 NOTE — Telephone Encounter (Signed)
EGD/Colonoscopy Patient is on Eliquis.   IDA.

## 2017-08-23 NOTE — Telephone Encounter (Signed)
Procedures needs to be done soon

## 2017-08-23 NOTE — Telephone Encounter (Signed)
Message left on answering machine 

## 2017-08-24 ENCOUNTER — Telehealth (INDEPENDENT_AMBULATORY_CARE_PROVIDER_SITE_OTHER): Payer: Self-pay | Admitting: *Deleted

## 2017-08-24 ENCOUNTER — Encounter (INDEPENDENT_AMBULATORY_CARE_PROVIDER_SITE_OTHER): Payer: Self-pay | Admitting: *Deleted

## 2017-08-24 DIAGNOSIS — D500 Iron deficiency anemia secondary to blood loss (chronic): Secondary | ICD-10-CM | POA: Insufficient documentation

## 2017-08-24 MED ORDER — PEG 3350-KCL-NA BICARB-NACL 420 G PO SOLR: 4000.0000 mL | Freq: Once | ORAL | 0 refills | Status: AC

## 2017-08-24 NOTE — Telephone Encounter (Signed)
Noted. I have spoken with patient.

## 2017-08-24 NOTE — Telephone Encounter (Signed)
Patient sch'd for colonoscopy/endoscopy 09/12/17, needs to stop ELiquis 2 days prior -- please advise if ok to stop

## 2017-08-24 NOTE — Telephone Encounter (Signed)
Not a pt of [employer], AGNP-C.  Not sure why I have received this message.

## 2017-08-24 NOTE — Telephone Encounter (Signed)
Patient needs trilyte 

## 2017-08-24 NOTE — Telephone Encounter (Signed)
TCS/EGD sch'd 09/12/17, left detailed message for patient, instructions mailed

## 2017-08-24 NOTE — Telephone Encounter (Signed)
OK to hold Eliquis 2 days prior to procedure.  Resume night of procedure if OK with Dr Laural Golden.

## 2017-08-24 NOTE — Telephone Encounter (Signed)
Patient aware.

## 2017-09-05 DIAGNOSIS — I2510 Atherosclerotic heart disease of native coronary artery without angina pectoris: Secondary | ICD-10-CM | POA: Diagnosis not present

## 2017-09-05 DIAGNOSIS — I4891 Unspecified atrial fibrillation: Secondary | ICD-10-CM | POA: Diagnosis not present

## 2017-09-05 DIAGNOSIS — I10 Essential (primary) hypertension: Secondary | ICD-10-CM | POA: Diagnosis not present

## 2017-09-05 DIAGNOSIS — M25579 Pain in unspecified ankle and joints of unspecified foot: Secondary | ICD-10-CM | POA: Diagnosis not present

## 2017-09-05 DIAGNOSIS — M79671 Pain in right foot: Secondary | ICD-10-CM | POA: Diagnosis not present

## 2017-09-05 DIAGNOSIS — M79672 Pain in left foot: Secondary | ICD-10-CM | POA: Diagnosis not present

## 2017-09-05 DIAGNOSIS — J449 Chronic obstructive pulmonary disease, unspecified: Secondary | ICD-10-CM | POA: Diagnosis not present

## 2017-09-12 ENCOUNTER — Encounter (HOSPITAL_COMMUNITY): Payer: Self-pay

## 2017-09-12 ENCOUNTER — Ambulatory Visit (HOSPITAL_COMMUNITY)
Admission: RE | Admit: 2017-09-12 | Discharge: 2017-09-12 | Disposition: A | Discharge status: Home / Self Care | Payer: Medicare Other | Source: Ambulatory Visit | Attending: Internal Medicine | Admitting: Internal Medicine

## 2017-09-12 ENCOUNTER — Encounter (HOSPITAL_COMMUNITY)
Admission: RE | Disposition: A | Discharge status: Home / Self Care | Payer: Self-pay | Source: Ambulatory Visit | Attending: Internal Medicine

## 2017-09-12 ENCOUNTER — Ambulatory Visit (INDEPENDENT_AMBULATORY_CARE_PROVIDER_SITE_OTHER): Payer: Medicare Other | Admitting: Internal Medicine

## 2017-09-12 DIAGNOSIS — K5730 Diverticulosis of large intestine without perforation or abscess without bleeding: Secondary | ICD-10-CM | POA: Diagnosis not present

## 2017-09-12 DIAGNOSIS — Z951 Presence of aortocoronary bypass graft: Secondary | ICD-10-CM | POA: Diagnosis not present

## 2017-09-12 DIAGNOSIS — K259 Gastric ulcer, unspecified as acute or chronic, without hemorrhage or perforation: Secondary | ICD-10-CM | POA: Insufficient documentation

## 2017-09-12 DIAGNOSIS — K644 Residual hemorrhoidal skin tags: Secondary | ICD-10-CM | POA: Diagnosis not present

## 2017-09-12 DIAGNOSIS — K6289 Other specified diseases of anus and rectum: Secondary | ICD-10-CM | POA: Diagnosis not present

## 2017-09-12 DIAGNOSIS — Z86010 Personal history of colonic polyps: Secondary | ICD-10-CM | POA: Diagnosis not present

## 2017-09-12 DIAGNOSIS — I4892 Unspecified atrial flutter: Secondary | ICD-10-CM | POA: Insufficient documentation

## 2017-09-12 DIAGNOSIS — I252 Old myocardial infarction: Secondary | ICD-10-CM | POA: Diagnosis not present

## 2017-09-12 DIAGNOSIS — I2510 Atherosclerotic heart disease of native coronary artery without angina pectoris: Secondary | ICD-10-CM | POA: Diagnosis not present

## 2017-09-12 DIAGNOSIS — J449 Chronic obstructive pulmonary disease, unspecified: Secondary | ICD-10-CM | POA: Insufficient documentation

## 2017-09-12 DIAGNOSIS — K31819 Angiodysplasia of stomach and duodenum without bleeding: Secondary | ICD-10-CM | POA: Diagnosis not present

## 2017-09-12 DIAGNOSIS — Z87891 Personal history of nicotine dependence: Secondary | ICD-10-CM | POA: Diagnosis not present

## 2017-09-12 DIAGNOSIS — D500 Iron deficiency anemia secondary to blood loss (chronic): Secondary | ICD-10-CM | POA: Insufficient documentation

## 2017-09-12 DIAGNOSIS — Z7901 Long term (current) use of anticoagulants: Secondary | ICD-10-CM | POA: Diagnosis not present

## 2017-09-12 DIAGNOSIS — K219 Gastro-esophageal reflux disease without esophagitis: Secondary | ICD-10-CM | POA: Diagnosis not present

## 2017-09-12 DIAGNOSIS — Z09 Encounter for follow-up examination after completed treatment for conditions other than malignant neoplasm: Secondary | ICD-10-CM | POA: Diagnosis not present

## 2017-09-12 DIAGNOSIS — Z79899 Other long term (current) drug therapy: Secondary | ICD-10-CM | POA: Diagnosis not present

## 2017-09-12 DIAGNOSIS — K228 Other specified diseases of esophagus: Secondary | ICD-10-CM | POA: Diagnosis not present

## 2017-09-12 DIAGNOSIS — K3189 Other diseases of stomach and duodenum: Secondary | ICD-10-CM | POA: Diagnosis not present

## 2017-09-12 DIAGNOSIS — Z791 Long term (current) use of non-steroidal anti-inflammatories (NSAID): Secondary | ICD-10-CM | POA: Diagnosis not present

## 2017-09-12 HISTORY — PX: ESOPHAGOGASTRODUODENOSCOPY: SHX5428

## 2017-09-12 HISTORY — PX: COLONOSCOPY: SHX5424

## 2017-09-12 LAB — HEMOGLOBIN AND HEMATOCRIT, BLOOD
HEMATOCRIT: 28.3 % — AB (ref 39.0–52.0)
Hemoglobin: 8.1 g/dL — ABNORMAL LOW (ref 13.0–17.0)

## 2017-09-12 SURGERY — EGD (ESOPHAGOGASTRODUODENOSCOPY)
Anesthesia: Moderate Sedation

## 2017-09-12 MED ORDER — FLINTSTONES PLUS IRON PO CHEW: 1.0000 | CHEWABLE_TABLET | Freq: Two times a day (BID) | ORAL | Status: DC

## 2017-09-12 MED ORDER — STERILE WATER FOR IRRIGATION IR SOLN
Status: DC | PRN
  Administered 2017-09-12: 2.5 mL

## 2017-09-12 MED ORDER — MEPERIDINE HCL 50 MG/ML IJ SOLN
INTRAMUSCULAR | Status: DC | PRN
  Administered 2017-09-12 (×2): 25 mg via INTRAVENOUS

## 2017-09-12 MED ORDER — LIDOCAINE VISCOUS 2 % MT SOLN
OROMUCOSAL | Status: DC | PRN
  Administered 2017-09-12: 1 via OROMUCOSAL

## 2017-09-12 MED ORDER — MIDAZOLAM HCL 5 MG/5ML IJ SOLN
INTRAMUSCULAR | Status: DC | PRN
  Administered 2017-09-12 (×4): 1 mg via INTRAVENOUS

## 2017-09-12 MED ORDER — MEPERIDINE HCL 50 MG/ML IJ SOLN
INTRAMUSCULAR | Status: AC
  Filled 2017-09-12: qty 1

## 2017-09-12 MED ORDER — SODIUM CHLORIDE 0.9 % IV SOLN
INTRAVENOUS | Status: DC
  Administered 2017-09-12: 10:00:00 via INTRAVENOUS

## 2017-09-12 MED ORDER — MIDAZOLAM HCL 5 MG/5ML IJ SOLN
INTRAMUSCULAR | Status: AC
  Filled 2017-09-12: qty 10

## 2017-09-12 MED ORDER — LIDOCAINE VISCOUS 2 % MT SOLN
OROMUCOSAL | Status: DC
Start: 2017-09-12 — End: 2017-09-12
  Filled 2017-09-12: qty 15

## 2017-09-12 NOTE — Op Note (Signed)
[facility] Patient Name: John Sampson Procedure Date: 09/12/2017 11:51 AM MRN: [id number] Date of Birth: [birth year] Attending MD: Hildred Laser , MD CSN: [id number] Age: 81 Admit Type: Outpatient Procedure:                Colonoscopy Indications:              High risk colon cancer surveillance: Personal                            history of colonic polyps Providers:                Hildred Laser, MD, Selena Lesser, Aram Candela Referring MD:             Jasper Loser. Luan Pulling MD, MD Medicines:                Midazolam 1 mg IV Complications:            No immediate complications. Estimated Blood Loss:     Estimated blood loss: none. Procedure:                Pre-Anesthesia Assessment:                           - Prior to the procedure, a History and Physical                            was performed, and patient medications and                            allergies were reviewed. The patient's tolerance of                            previous anesthesia was also reviewed. The risks                            and benefits of the procedure and the sedation                            options and risks were discussed with the patient.                            All questions were answered, and informed consent                            was obtained. Prior Anticoagulants: The patient                            last took Eliquis (apixaban) 3 days prior to the                            procedure. ASA Grade Assessment: III - A patient                            with severe systemic disease. After reviewing the  risks and benefits, the patient was deemed in                            satisfactory condition to undergo the procedure.                           After obtaining informed consent, the colonoscope                            was passed under direct vision. Throughout the                            procedure, the patient's blood pressure, pulse, and                   oxygen saturations were monitored continuously. The                            [id number] ([id number]) scope was introduced through                            the anus and advanced to the the cecum, identified                            by appendiceal orifice and ileocecal valve. The                            colonoscopy was performed without difficulty. The                            patient tolerated the procedure well. The quality                            of the bowel preparation was excellent. The                            ileocecal valve, appendiceal orifice, and rectum                            were photographed. Scope In: 11:53:14 AM Scope Out: 12:14:37 PM Scope Withdrawal Time: 0 hours 9 minutes 35 seconds  Total Procedure Duration: 0 hours 21 minutes 23 seconds  Findings:      The perianal and digital rectal examinations were normal.      Scattered medium-mouthed diverticula were found in the sigmoid colon and       ascending colon.      External hemorrhoids were found during retroflexion. The hemorrhoids       were small.      Anal papilla(e) were hypertrophied. Impression:               - Diverticulosis in the sigmoid colon and in the                            ascending colon.                           -  External hemorrhoids.                           - Anal papilla(e) were hypertrophied.                           - No specimens collected. Moderate Sedation:      Moderate (conscious) sedation was administered by the endoscopy nurse       and supervised by the endoscopist. The following parameters were       monitored: oxygen saturation, heart rate, blood pressure, CO2       capnography and response to care. Total physician intraservice time was       25 minutes. Recommendation:           - Patient has a contact number available for                            emergencies. The signs and symptoms of potential                            delayed complications  were discussed with the                            patient. Return to normal activities tomorrow.                            Written discharge instructions were provided to the                            patient.                           - High fiber diet today.                           - Continue present medications.                           - Resume Eliquis (apixaban) at prior dose in 3 days.                           - No repeat colonoscopy due to the absence of                            advanced adenomas. Procedure Code(s):        --- Professional ---                           [phone], Colonoscopy, flexible; diagnostic, including                            collection of specimen(s) by brushing or washing,                            when performed (separate procedure)  93734, Moderate sedation services provided by the                            same physician or other qualified health care                            professional performing the diagnostic or                            therapeutic service that the sedation supports,                            requiring the presence of an independent trained                            observer to assist in the monitoring of the                            patient's level of consciousness and physiological                            status; initial 15 minutes of intraservice time,                            patient age 2 years or older                           [phone], Moderate sedation services; each additional                            15 minutes intraservice time Diagnosis Code(s):        --- Professional ---                           Z86.010, Personal history of colonic polyps                           K64.4, Residual hemorrhoidal skin tags                           K62.89, Other specified diseases of anus and rectum                           K57.30, Diverticulosis of large intestine without                             perforation or abscess without bleeding CPT copyright 2016 American [employer] Association. All rights reserved. The codes documented in this report are preliminary and upon coder review may  be revised to meet current compliance requirements. Hildred Laser, MD Hildred Laser, MD 09/12/2017 12:38:39 PM This report has been signed electronically. Number of Addenda: 0

## 2017-09-12 NOTE — Op Note (Signed)
[facility] Patient Name: John Sampson Procedure Date: 09/12/2017 11:06 AM MRN: [id number] Date of Birth: 03-01-1936 Attending MD: Hildred Laser , MD CSN: [id number] Age: 81 Admit Type: Outpatient Procedure:                Upper GI endoscopy Indications:              Iron deficiency anemia secondary to chronic blood                            loss, Follow-up of gastric ulcer Providers:                Hildred Laser, MD, Selena Lesser, Lurline Del, RN Referring MD:             Jasper Loser. Luan Pulling MD, MD Medicines:                Lidocaine spray, Meperidine 25 mg IV, Midazolam 3                            mg IV Complications:            No immediate complications. Estimated Blood Loss:     Estimated blood loss was minimal. Procedure:                Pre-Anesthesia Assessment:                           - Prior to the procedure, a History and Physical                            was performed, and patient medications and                            allergies were reviewed. The patient's tolerance of                            previous anesthesia was also reviewed. The risks                            and benefits of the procedure and the sedation                            options and risks were discussed with the patient.                            All questions were answered, and informed consent                            was obtained. Prior Anticoagulants: The patient                            last took Eliquis (apixaban) 3 days prior to the                            procedure. ASA Grade Assessment: III - A patient  with severe systemic disease. After reviewing the                            risks and benefits, the patient was deemed in                            satisfactory condition to undergo the procedure.                           After obtaining informed consent, the endoscope was                            passed under direct vision. Throughout the                             procedure, the patient's blood pressure, pulse, and                            oxygen saturations were monitored continuously. The                            EG-[id number] ([id number]) scope was introduced through the                            mouth, and advanced to the third part of duodenum.                            The upper GI endoscopy was accomplished without                            difficulty. The patient tolerated the procedure                            well. Scope In: 11:37:13 AM Scope Out: 35:32:99 AM Total Procedure Duration: 0 hours 12 minutes 33 seconds  Findings:      The examined esophagus was normal.      The Z-line was irregular and was found 42 cm from the incisors.      A healed ulcer was found in the prepyloric region of the stomach.      Two non-bleeding superficial gastric ulcers with no stigmata of bleeding       were found in the prepyloric region of the stomach. The largest lesion       was 3 mm in largest dimension.      The exam of the stomach was otherwise normal.      The duodenal bulb was normal.      Three angiodysplastic lesions without bleeding were found in the second       portion of the duodenum and in the third portion of the duodenum.       Coagulation for bleeding prevention using argon plasma was successful. Impression:               - Normal esophagus.                           - Z-line irregular,  42 cm from the incisors.                           - Scar in the prepyloric region of the stomach.                           - Non-bleeding gastric ulcers with no stigmata of                            bleeding. These ulcers appear to be healing.                           - Normal duodenal bulb.                           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 Treated with argon plasma coagulation                            (APC).                           - No specimens  collected. Moderate Sedation:      Moderate (conscious) sedation was administered by the endoscopy nurse       and supervised by the endoscopist. The following parameters were       monitored: oxygen saturation, heart rate, blood pressure, CO2       capnography and response to care. Total physician intraservice time was       18 minutes. Recommendation:           - Patient has a contact number available for                            emergencies. The signs and symptoms of potential                            delayed complications were discussed with the                            patient. Return to normal activities tomorrow.                            Written discharge instructions were provided to the                            patient.                           - High fiber diet today.                           - Continue present medications.                           -  Resume Eliquis (apixaban) at prior dose in 3 days.                           - Repeat upper endoscopy PRN. Procedure Code(s):        --- Professional ---                           [phone], Esophagogastroduodenoscopy, flexible,                            transoral; with control of bleeding, any method                           99152, Moderate sedation services provided by the                            same physician or other qualified health care                            professional performing the diagnostic or                            therapeutic service that the sedation supports,                            requiring the presence of an independent trained                            observer to assist in the monitoring of the                            patient's level of consciousness and physiological                            status; initial 15 minutes of intraservice time,                            patient age 68 years or older Diagnosis Code(s):        --- Professional ---                           K22.8,  Other specified diseases of esophagus                           K31.89, Other diseases of stomach and duodenum                           K25.9, Gastric ulcer, unspecified as acute or                            chronic, without hemorrhage or perforation                           K31.819, Angiodysplasia of stomach and duodenum  without bleeding                           D50.0, Iron deficiency anemia secondary to blood                            loss (chronic) CPT copyright 2016 American Medical Association. All rights reserved. The codes documented in this report are preliminary and upon coder review may  be revised to meet current compliance requirements. Hildred Laser, MD Hildred Laser, MD 09/12/2017 12:35:18 PM This report has been signed electronically. Number of Addenda: 0

## 2017-09-12 NOTE — H&P (Signed)
John Sampson is an 81 y.o. male.   Chief Complaint: patient is here for EGD and colonoscopy. HPI: patient is 81 year old Caucasian male with multiple medical problems who has history of gastric ulcer, duodenal AV malformation and col who presents with persistent anemia and history of melena and heme positive stools.last EGD was in May 2018. Hemoglobin 3 weeks ago was 8.5 g.  He has not experienced melena or rectal bleeding recently.he has been off anticoagulants for 3days. He does not take OTC NSAIDS. Last colonoscopy was in September 2013 with removal of 4 polyps and 3 were adenomas. Family history is negative for CRC.  Past Medical History:  Diagnosis Date  . Atrial flutter ([place])   . AV malformation of GI tract   . Cancer ([place])    Thyroid  . COPD (chronic obstructive pulmonary disease) ([place]) 3.12.14   2D Echo, EF 50-55%  . Coronary atherosclerosis of native coronary artery    Multivessel status post CABG  . Gastric ulcer    Small - nonbleeding  . GERD (gastroesophageal reflux disease)   . Headache(784.0)   . Iron deficiency anemia    Negative Givens capsule study   . Myocardial infarction ([place])   . PAD (peripheral artery disease) ([facility])    Moderate bilateral SFA disease at angiography 01/2013  . 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 ABDOMINAL AORTAGRAM N/A 02/19/2013   Procedure: ABDOMINAL Maxcine Ham;  Surgeon: [name], MD;  Location: [facility] CATH LAB;  Service: Cardiovascular;  Laterality: N/A;  . CARDIAC CATHETERIZATION    . CARDIOVERSION N/A 06/16/2015   Procedure: CARDIOVERSION;  Surgeon: Satira Sark, MD;  Location: [facility] ORS;  Service: Cardiovascular;  Laterality: N/A;  . COLONOSCOPY  08/18/2012   Procedure: COLONOSCOPY;  Surgeon: [name], MD;  [facility] ENDO SU[facility];  Service: Endoscopy;  Laterality: N/A;  1:25  . CORONARY ARTERY BYPASS GRAFT  2003   5 grafts - details not clear  . CRANIOTOMY  12/31/2011   Procedure: CRANIOTOMY HYPOPHYSECTOMY TRANSNASAL APPROACH;  Surgeon: [name], MD;  Location: [place] NEURO ORS;  Service: Neurosurgery;  Laterality: N/A;  Transphenoidal Hypophysectomy With Fat Graft Harvest from right abdomen   . ESOPHAGOGASTRODUODENOSCOPY  08/18/2012   Procedure: ESOPHAGOGASTRODUODENOSCOPY (EGD);  Surgeon: [name], MD;  [facility];  Service: Endoscopy;  Laterality: N/A;  . ESOPHAGOGASTRODUODENOSCOPY N/A 03/31/2017   Procedure: ESOPHAGOGASTRODUODENOSCOPY (EGD);  Surgeon: [name], MD;  [facility];  Service: Endoscopy;  Laterality: N/A;  . EYE SURGERY  2012  . GIVENS CAPSULE STUDY N/A 01/25/2013   Procedure: GIVENS CAPSULE STUDY;  Surgeon: [name], MD;  [facility];  Service: Endoscopy;  Laterality: N/A;  730  . HOT HEMOSTASIS  03/31/2017   Procedure: HOT HEMOSTASIS (ARGON PLASMA COAGULATION/BICAP);  Surgeon: [name], MD;  [facility];  Service: Endoscopy;;  duodenum  . LARYNGOPLASTY Left 09/14/2016   Procedure: LARYNGOPLASTY;  Surgeon: [name], MD;  Location: [place];  Service: ENT;  Laterality: Left;  . LOWER EXTREMITY ANGIOGRAM N/A 02/19/2013   Procedure: LOWER EXTREMITY ANGIOGRAM;  Surgeon: [name], MD;  Location: [facility] CATH LAB;  Service: Cardiovascular;  Laterality: N/A;  . PERIPHERAL VASCULAR CATHETERIZATION N/A 01/19/2016   Procedure: Abdominal Aortogram;  Surgeon: Conrad Applegate, MD;  Location: [place] CV LAB;  Service: Cardiovascular;  Laterality: N/A;  . PERIPHERAL VASCULAR CATHETERIZATION Bilateral 01/19/2016   Procedure:  Lower Extremity Angiography;  Surgeon: Conrad Branch, MD;  Location: [place] CV LAB;  Service: Cardiovascular;  Laterality: Bilateral;  . PV Angiogram  02/19/13   Indications: slow healing left calf ulcer  . Stress Myocardial Perfusion  12/08/2011   Indications: Abnormal EKG, Eval of prior GABG  . TEE WITHOUT CARDIOVERSION N/A 06/16/2015   Procedure:  TRANSESOPHAGEAL ECHOCARDIOGRAM (TEE);  Surgeon: Satira Sark, MD;  Location[facility]S;  Service: Cardiovascular;  Laterality: N/A;  . THYROIDECTOMY N/A 09/14/2016   Procedure: TOTAL THYROIDECTOMY;  Surgeon: [name], MD;  Location: [place];  Service: ENT;  Laterality: N/A;  . THYROIDECTOMY    . TONSILLECTOMY  Age 89    Family History  Problem Relation Age of Onset  . CAD Mother   . Heart disease Father        before age 90   Social History:  reports that he quit smoking about 15 years ago. His smoking use included Cigarettes. He started smoking about 62 years ago. He has a 135.00 pack-year smoking history. He has never used smokeless tobacco. He reports that he does not drink alcohol or use drugs.  Allergies:  Allergies  Allergen Reactions  . No Known Allergies     Medications Prior to Admission  Medication Sig Dispense Refill  . acetaminophen (TYLENOL) 325 MG tablet Take 650 mg by mouth daily as needed for headache.    . albuterol (PROVENTIL HFA) 108 (90 BASE) MCG/ACT inhaler Inhale 2 puffs into the lungs every 6 (six) hours as needed for wheezing or shortness of breath. 1 Inhaler 3  . apixaban (ELIQUIS) 5 MG TABS tablet Take 1 tablet (5 mg total) by mouth 2 (two) times daily. Hold until Monday then start 1 tablet twice a day as before 60 tablet 3  . diltiazem (CARDIZEM CD) 240 MG 24 hr capsule Take 1 capsule (240 mg total) by mouth daily. 90 capsule 3  . fish oil-omega-3 fatty acids 1000 MG capsule Take 1 g by mouth 2 (two) times daily.     . folic acid (FOLVITE) 1 MG tablet Take 1 mg by mouth daily.     . furosemide (LASIX) 20 MG tablet Take 1 tablet (20 mg total) by mouth daily. (Patient taking differently: Take 20 mg by mouth daily as needed for fluid. ) 90 tablet 3  . levothyroxine (SYNTHROID, LEVOTHROID) 137 MCG tablet Take 137 mcg by mouth daily before breakfast.     . lisinopril (PRINIVIL,ZESTRIL) 10 MG tablet Take 1 tablet (10 mg total) by mouth daily. 90 tablet 3   . metoprolol succinate (TOPROL-XL) 50 MG 24 hr tablet Take 50 mg by mouth 2 (two) times daily. Take with or immediately following a meal.    . multivitamin (ONE-A-DAY MEN'S) TABS tablet Take 1 tablet by mouth daily.    . nitroGLYCERIN (NITROSTAT) 0.4 MG SL tablet Place 1 tablet (0.4 mg total) under the tongue every 5 (five) minutes as needed for chest pain. 30 tablet 9  . omeprazole (PRILOSEC) 20 MG capsule Take 20 mg by mouth 2 (two) times daily before a meal.    . potassium chloride SA (K-DUR,KLOR-CON) 20 MEQ tablet Take 20 mEq by mouth daily.    . simvastatin (ZOCOR) 40 MG tablet Take 0.5 tablets (20 mg total) by mouth at bedtime. 30 tablet 12    No results found for this or any previous visit (from the past 48 hour(s)). No results found.  ROS  Blood pressure (!) 179/68, pulse 74,  temperature 97.7 F (36.5 C), temperature source Oral, resp. rate 20, height 5\' 10"  (1.778 m), weight 212 lb (96.2 kg), SpO2 98 %. Physical Exam  Constitutional: He appears well-developed and well-nourished.  HENT:  Mouth/Throat: Oropharynx is clear and moist.  Eyes: Conjunctivae are normal. No scleral icterus.  Neck: No thyromegaly present.  Collar scar..  Cardiovascular:  Regular rhythm with occasional irregularity. Normal  S1 and S2. No murmur or gallop noted.  Respiratory: Effort normal and breath sounds normal.  midsternal scar.  GI: Soft. He exhibits no distension and no mass. There is no tenderness.  Small umbilical hernia.  Musculoskeletal: He exhibits no edema.  Lymphadenopathy:    He has no cervical adenopathy.  Neurological: He is alert.  Skin: Skin is warm and dry.     Assessment/Plan Iron deficiency anemia. History of gastric ulcer, duodenal  AVMs. History of colonic adenomas. Diagnostic EGD and surveillance colonoscopy.  Hildred Laser, MD 09/12/2017, 11:22 AM

## 2017-09-12 NOTE — Discharge Instructions (Signed)
Resume  Apixaban on 09/15/2017. Resume other medications as before. High fiber diet. No driving for 24 hours. Physician will call with results of blood test when completed.   Colonoscopy, Adult, Care After This sheet gives you information about how to care for yourself after your procedure. Your health care provider may also give you more specific instructions. If you have problems or questions, contact your health care provider. What can I expect after the procedure? After the procedure, it is common to have:  A small amount of blood in your stool for 24 hours after the procedure.  Some gas.  Mild abdominal cramping or bloating.  Follow these instructions at home: General instructions   For the first 24 hours after the procedure: ? Do not drive or use machinery. ? Do not sign important documents. ? Do not drink alcohol. ? Do your regular daily activities at a slower pace than normal. ? Eat soft, easy-to-digest foods. ? Rest often.  Take over-the-counter or prescription medicines only as told by your health care provider.  It is up to you to get the results of your procedure. Ask your health care provider, or the department performing the procedure, when your results will be ready. Relieving cramping and bloating  Try walking around when you have cramps or feel bloated.  Apply heat to your abdomen as told by your health care provider. Use a heat source that your health care provider recommends, such as a moist heat pack or a heating pad. ? Place a towel between your skin and the heat source. ? Leave the heat on for 20-30 minutes. ? Remove the heat if your skin turns bright red. This is especially important if you are unable to feel pain, heat, or cold. You may have a greater risk of getting burned. Eating and drinking  Drink enough fluid to keep your urine clear or pale yellow.  Resume your normal diet as instructed by your health care provider. Avoid heavy or fried foods  that are hard to digest.  Avoid drinking alcohol for as long as instructed by your health care provider. Contact a health care provider if:  You have blood in your stool 2-3 days after the procedure. Get help right away if:  You have more than a small spotting of blood in your stool.  You pass large blood clots in your stool.  Your abdomen is swollen.  You have nausea or vomiting.  You have a fever.  You have increasing abdominal pain that is not relieved with medicine. This information is not intended to replace advice given to you by your health care provider. Make sure you discuss any questions you have with your health care provider. Document Released: 06/29/2004 Document Revised: 08/09/2016 Document Reviewed: 01/27/2016 Elsevier Interactive Patient Education  2018 [employer].   Esophagogastroduodenoscopy, Care After Refer to this sheet in the next few weeks. These instructions provide you with information about caring for yourself after your procedure. Your health care provider may also give you more specific instructions. Your treatment has been planned according to current medical practices, but problems sometimes occur. Call your health care provider if you have any problems or questions after your procedure. What can I expect after the procedure? After the procedure, it is common to have:  A sore throat.  Nausea.  Bloating.  Dizziness.  Fatigue.  Follow these instructions at home:  Do not eat or drink anything until the numbing medicine (local anesthetic) has worn off and your gag reflex  has returned. You will know that the local anesthetic has worn off when you can swallow comfortably.  Do not drive for 24 hours if you received a medicine to help you relax (sedative).  If your health care provider took a tissue sample for testing during the procedure, make sure to get your test results. This is your responsibility. Ask your health care provider or the  department performing the test when your results will be ready.  Keep all follow-up visits as told by your health care provider. This is important. Contact a health care provider if:  You cannot stop coughing.  You are not urinating.  You are urinating less than usual. Get help right away if:  You have trouble swallowing.  You cannot eat or drink.  You have throat or chest pain that gets worse.  You are dizzy or light-headed.  You faint.  You have nausea or vomiting.  You have chills.  You have a fever.  You have severe abdominal pain.  You have black, tarry, or bloody stools. This information is not intended to replace advice given to you by your health care provider. Make sure you discuss any questions you have with your health care provider. Document Released: 11/01/2012 Document Revised: 04/22/2016 Document Reviewed: 10/09/2015 Elsevier Interactive Patient Education  2018 [employer].   High-Fiber Diet Fiber, also called dietary fiber, is a type of carbohydrate found in fruits, vegetables, whole grains, and beans. A high-fiber diet can have many health benefits. Your health care provider may recommend a high-fiber diet to help:  Prevent constipation. Fiber can make your bowel movements more regular.  Lower your cholesterol.  Relieve hemorrhoids, uncomplicated diverticulosis, or irritable bowel syndrome.  Prevent overeating as part of a weight-loss plan.  Prevent heart disease, type 2 diabetes, and certain cancers.  What is my plan? The recommended daily intake of fiber includes:  38 grams for men under age 42.  74 grams for men over age 50.  51 grams for women under age 68.  68 grams for women over age 77.  You can get the recommended daily intake of dietary fiber by eating a variety of fruits, vegetables, grains, and beans. Your health care provider may also recommend a fiber supplement if it is not possible to get enough fiber through your  diet. What do I need to know about a high-fiber diet?  Fiber supplements have not been widely studied for their effectiveness, so it is better to get fiber through food sources.  Always check the fiber content on thenutrition facts label of any prepackaged food. Look for foods that contain at least 5 grams of fiber per serving.  Ask your dietitian if you have questions about specific foods that are related to your condition, especially if those foods are not listed in the following section.  Increase your daily fiber consumption gradually. Increasing your intake of dietary fiber too quickly may cause bloating, cramping, or gas.  Drink plenty of water. Water helps you to digest fiber. What foods can I eat? Grains Whole-grain breads. Multigrain cereal. Oats and oatmeal. Brown rice. Barley. Bulgur wheat. [place]. Bran muffins. Popcorn. Rye wafer crackers. Vegetables Sweet potatoes. Spinach. Kale. Artichokes. Cabbage. Broccoli. Green peas. Carrots. Squash. Fruits Berries. Pears. Apples. Oranges. Avocados. Prunes and raisins. Dried figs. Meats and Other Protein Sources Navy, kidney, pinto, and soy beans. Split peas. Lentils. Nuts and seeds. Dairy Fiber-fortified yogurt. Beverages Fiber-fortified soy milk. Fiber-fortified orange juice. Other Fiber bars. The items listed above may not  be a complete list of recommended foods or beverages. Contact your dietitian for more options. What foods are not recommended? Grains White bread. Pasta made with refined flour. White rice. Vegetables Fried potatoes. Canned vegetables. Well-cooked vegetables. Fruits Fruit juice. Cooked, strained fruit. Meats and Other Protein Sources Fatty cuts of meat. Fried Sales executive or fried fish. Dairy Milk. Yogurt. Cream cheese. Sour cream. Beverages Soft drinks. Other Cakes and pastries. Butter and oils. The items listed above may not be a complete list of foods and beverages to avoid. Contact your dietitian for  more information. What are some tips for including high-fiber foods in my diet?  Eat a wide variety of high-fiber foods.  Make sure that half of all grains consumed each day are whole grains.  Replace breads and cereals made from refined flour or white flour with whole-grain breads and cereals.  Replace white rice with brown rice, bulgur wheat, or millet.  Start the day with a breakfast that is high in fiber, such as a cereal that contains at least 5 grams of fiber per serving.  Use beans in place of meat in soups, salads, or pasta.  Eat high-fiber snacks, such as berries, raw vegetables, nuts, or popcorn. This information is not intended to replace advice given to you by your health care provider. Make sure you discuss any questions you have with your health care provider. Document Released: 11/15/2005 Document Revised: 04/22/2016 Document Reviewed: 04/30/2014 Elsevier Interactive Patient Education  2017 [employer].

## 2017-09-13 ENCOUNTER — Other Ambulatory Visit (INDEPENDENT_AMBULATORY_CARE_PROVIDER_SITE_OTHER): Payer: Self-pay | Admitting: *Deleted

## 2017-09-13 DIAGNOSIS — R531 Weakness: Secondary | ICD-10-CM

## 2017-09-13 DIAGNOSIS — D509 Iron deficiency anemia, unspecified: Secondary | ICD-10-CM

## 2017-09-13 DIAGNOSIS — K922 Gastrointestinal hemorrhage, unspecified: Secondary | ICD-10-CM

## 2017-09-13 DIAGNOSIS — D649 Anemia, unspecified: Secondary | ICD-10-CM

## 2017-09-14 ENCOUNTER — Encounter (HOSPITAL_COMMUNITY): Payer: Self-pay | Admitting: Internal Medicine

## 2017-09-15 DIAGNOSIS — K922 Gastrointestinal hemorrhage, unspecified: Secondary | ICD-10-CM | POA: Diagnosis not present

## 2017-09-15 DIAGNOSIS — D509 Iron deficiency anemia, unspecified: Secondary | ICD-10-CM | POA: Diagnosis not present

## 2017-09-16 LAB — IRON, TOTAL/TOTAL IRON BINDING CAP
%SAT: 15 % (ref 15–60)
Iron: 62 ug/dL (ref 50–180)
TIBC: 413 mcg/dL (calc) (ref 250–425)

## 2017-09-16 LAB — HEMOGLOBIN AND HEMATOCRIT, BLOOD
HCT: 27.6 % — ABNORMAL LOW (ref 38.5–50.0)
HEMOGLOBIN: 8.1 g/dL — AB (ref 13.2–17.1)

## 2017-09-16 LAB — VITAMIN B12: Vitamin B-12: 1168 pg/mL — ABNORMAL HIGH (ref 200–1100)

## 2017-09-16 LAB — FOLATE: Folate: 10 ng/mL

## 2017-09-19 DIAGNOSIS — E1140 Type 2 diabetes mellitus with diabetic neuropathy, unspecified: Secondary | ICD-10-CM | POA: Diagnosis not present

## 2017-09-19 DIAGNOSIS — E1151 Type 2 diabetes mellitus with diabetic peripheral angiopathy without gangrene: Secondary | ICD-10-CM | POA: Diagnosis not present

## 2017-09-21 ENCOUNTER — Other Ambulatory Visit (INDEPENDENT_AMBULATORY_CARE_PROVIDER_SITE_OTHER): Payer: Self-pay | Admitting: *Deleted

## 2017-09-21 DIAGNOSIS — D649 Anemia, unspecified: Secondary | ICD-10-CM

## 2017-09-23 DIAGNOSIS — D649 Anemia, unspecified: Secondary | ICD-10-CM | POA: Diagnosis not present

## 2017-09-23 LAB — HEMOGLOBIN AND HEMATOCRIT, BLOOD
HEMATOCRIT: 30 % — AB (ref 38.5–50.0)
HEMOGLOBIN: 8.8 g/dL — AB (ref 13.2–17.1)

## 2017-09-26 ENCOUNTER — Telehealth (INDEPENDENT_AMBULATORY_CARE_PROVIDER_SITE_OTHER): Payer: Self-pay | Admitting: *Deleted

## 2017-09-26 NOTE — Telephone Encounter (Signed)
   Diagnosis:    Result(s)   Card 1:Negative:          Completed by: Thomas Hoff , LPN   HEMOCCULT SENSA DEVELOPER: LOT#:  [id number] EXPIRATION DATE: 2020-10   HEMOCCULT SENSA CARD:  LOT#:  [id number] EXPIRATION DATE: 03/20   CARD CONTROL RESULTS:  POSITIVE: Positive NEGATIVE: Negative    ADDITIONAL COMMENTS: Patient was called and given his results. Forwarded to [place] for review.

## 2017-09-27 NOTE — Telephone Encounter (Signed)
Stool heme-negative 

## 2017-10-03 ENCOUNTER — Ambulatory Visit (HOSPITAL_COMMUNITY): Admission: RE | Admit: 2017-10-03 | Payer: Medicare Other | Source: Ambulatory Visit

## 2017-10-03 DIAGNOSIS — M79671 Pain in right foot: Secondary | ICD-10-CM | POA: Diagnosis not present

## 2017-10-03 DIAGNOSIS — M25579 Pain in unspecified ankle and joints of unspecified foot: Secondary | ICD-10-CM | POA: Diagnosis not present

## 2017-10-03 DIAGNOSIS — M79672 Pain in left foot: Secondary | ICD-10-CM | POA: Diagnosis not present

## 2017-10-04 DIAGNOSIS — E039 Hypothyroidism, unspecified: Secondary | ICD-10-CM | POA: Diagnosis not present

## 2017-10-04 DIAGNOSIS — E890 Postprocedural hypothyroidism: Secondary | ICD-10-CM | POA: Diagnosis not present

## 2017-10-06 ENCOUNTER — Ambulatory Visit (HOSPITAL_COMMUNITY)
Admission: RE | Admit: 2017-10-06 | Discharge: 2017-10-06 | Disposition: A | Discharge status: Home / Self Care | Payer: Medicare Other | Source: Ambulatory Visit | Attending: Cardiology | Admitting: Cardiology

## 2017-10-06 DIAGNOSIS — I6523 Occlusion and stenosis of bilateral carotid arteries: Secondary | ICD-10-CM | POA: Insufficient documentation

## 2017-10-06 DIAGNOSIS — I779 Disorder of arteries and arterioles, unspecified: Secondary | ICD-10-CM | POA: Diagnosis not present

## 2017-10-06 DIAGNOSIS — I739 Peripheral vascular disease, unspecified: Secondary | ICD-10-CM

## 2017-10-25 ENCOUNTER — Telehealth: Payer: Self-pay

## 2017-10-25 NOTE — Telephone Encounter (Signed)
**Note De-Identified Salam [place] Obfuscation** The pts Eliquis from [facility] pt assistant was mailed to this office [facility].) and he is seen in our [place] office.  Per Varney Biles at the [place] office I have placed the pts Eliquis in interoffice mail to be delivered to the [place] office.

## 2017-10-27 ENCOUNTER — Telehealth: Payer: Self-pay | Admitting: *Deleted

## 2017-10-27 NOTE — Telephone Encounter (Signed)
Pt.notified

## 2017-10-27 NOTE — Telephone Encounter (Signed)
Called to notify pt Eliquis is in office for pick up. Left msg to call back.

## 2017-10-31 DIAGNOSIS — M79672 Pain in left foot: Secondary | ICD-10-CM | POA: Diagnosis not present

## 2017-10-31 DIAGNOSIS — M79671 Pain in right foot: Secondary | ICD-10-CM | POA: Diagnosis not present

## 2017-10-31 DIAGNOSIS — M25579 Pain in unspecified ankle and joints of unspecified foot: Secondary | ICD-10-CM | POA: Diagnosis not present

## 2017-11-01 DIAGNOSIS — R490 Dysphonia: Secondary | ICD-10-CM | POA: Diagnosis not present

## 2017-11-30 DIAGNOSIS — S61402A Unspecified open wound of left hand, initial encounter: Secondary | ICD-10-CM | POA: Diagnosis not present

## 2017-12-01 ENCOUNTER — Ambulatory Visit (HOSPITAL_COMMUNITY): Payer: Medicare Other | Attending: Pulmonary Disease | Admitting: Physical Therapy

## 2017-12-01 ENCOUNTER — Encounter (HOSPITAL_COMMUNITY): Payer: Self-pay | Admitting: Physical Therapy

## 2017-12-01 ENCOUNTER — Other Ambulatory Visit: Payer: Self-pay

## 2017-12-01 ENCOUNTER — Telehealth (HOSPITAL_COMMUNITY): Payer: Self-pay

## 2017-12-01 DIAGNOSIS — M79642 Pain in left hand: Secondary | ICD-10-CM | POA: Insufficient documentation

## 2017-12-01 DIAGNOSIS — S60419A Abrasion of unspecified finger, initial encounter: Secondary | ICD-10-CM | POA: Insufficient documentation

## 2017-12-01 DIAGNOSIS — S60512A Abrasion of left hand, initial encounter: Secondary | ICD-10-CM | POA: Insufficient documentation

## 2017-12-01 DIAGNOSIS — X58XXXA Exposure to other specified factors, initial encounter: Secondary | ICD-10-CM | POA: Insufficient documentation

## 2017-12-01 NOTE — Therapy (Signed)
[place], [place], [address] Phone: [phone]   Fax:  [phone]  Wound Care Evaluation  Patient Details  Name: John Sampson MRN: [id number] Date of Birth: 06/11/1936 Referring Provider: Sinda Du MD   Encounter Date: 12/01/2017  PT End of Session - 12/01/17 1402    Visit Number  1    Number of Visits  3    Date for PT Re-Evaluation  12/15/17    Authorization Type  Medicare/Medicare Part A and B    Authorization Time Period  01/01/2018-01/15/2018    PT Start Time  1302    PT Stop Time  1325    PT Time Calculation (min)  23 min    Activity Tolerance  Patient tolerated treatment well    Behavior During Therapy  [facility] for tasks assessed/performed       Past Medical History:  Diagnosis Date  . Atrial flutter ([place])   . AV malformation of GI tract   . Cancer ([place])    Thyroid  . COPD (chronic obstructive pulmonary disease) ([place]) 3.12.14   2D Echo, EF 50-55%  . Coronary atherosclerosis of native coronary artery    Multivessel status post CABG  . Gastric ulcer    Small - nonbleeding  . GERD (gastroesophageal reflux disease)   . Headache(784.0)   . Iron deficiency anemia    Negative Givens capsule study   . Myocardial infarction ([place])   . PAD (peripheral artery disease) ([facility])    Moderate bilateral SFA disease at angiography 01/2013  . Pituitary macroadenoma [facility])     Past Surgical History:  Procedure Laterality Date  . ABDOMINAL AORTAGRAM N/A 02/19/2013   Procedure: ABDOMINAL Maxcine Ham;  Surgeon: [name], MD;  Location: [facility] CATH LAB;  Service: Cardiovascular;  Laterality: N/A;  . CARDIAC CATHETERIZATION    . CARDIOVERSION N/A 06/16/2015   Procedure: CARDIOVERSION;  Surgeon: Satira Sark, MD;  Location: [facility] ORS;  Service: Cardiovascular;  Laterality: N/A;  . COLONOSCOPY  08/18/2012   Procedure: COLONOSCOPY;  Surgeon: [name], MD;  [facility] ENDO SU[facility];  Service: Endoscopy;   Laterality: N/A;  1:25  . COLONOSCOPY N/A 09/12/2017   Procedure: COLONOSCOPY;  Surgeon: [name], MD;  [facility] ENDO SU[facility];  Service: Endoscopy;  Laterality: N/A;  . CORONARY ARTERY BYPASS GRAFT  2003   5 grafts - details not clear  . CRANIOTOMY  12/31/2011   Procedure: CRANIOTOMY HYPOPHYSECTOMY TRANSNASAL APPROACH;  Surgeon: [name], MD;  Location: [place] NEURO ORS;  Service: Neurosurgery;  Laterality: N/A;  Transphenoidal Hypophysectomy With Fat Graft Harvest from right abdomen   . ESOPHAGOGASTRODUODENOSCOPY  08/18/2012   Procedure: ESOPHAGOGASTRODUODENOSCOPY (EGD);  Surgeon: [name], MD;  [facility];  Service: Endoscopy;  Laterality: N/A;  . ESOPHAGOGASTRODUODENOSCOPY N/A 03/31/2017   Procedure: ESOPHAGOGASTRODUODENOSCOPY (EGD);  Surgeon: [name], MD;  [facility];  Service: Endoscopy;  Laterality: N/A;  . ESOPHAGOGASTRODUODENOSCOPY N/A 09/12/2017   Procedure: ESOPHAGOGASTRODUODENOSCOPY (EGD);  Surgeon: [name], MD;  [facility];  Service: Endoscopy;  Laterality: N/A;  10:40  . EYE SURGERY  2012  . GIVENS CAPSULE STUDY N/A 01/25/2013   Procedure: GIVENS CAPSULE STUDY;  Surgeon: [name], MD;  [facility];  Service: Endoscopy;  Laterality: N/A;  730  . HOT HEMOSTASIS  03/31/2017   Procedure: HOT HEMOSTASIS (ARGON PLASMA COAGULATION/BICAP);  Surgeon: [name], MD;  [facility];  Service: Endoscopy;;  duodenum  . LARYNGOPLASTY Left 09/14/2016   Procedure: LARYNGOPLASTY;  Surgeon: [name], MD;  Location: [place];  Service: ENT;  Laterality: Left;  . LOWER EXTREMITY ANGIOGRAM N/A 02/19/2013   Procedure: LOWER EXTREMITY ANGIOGRAM;  Surgeon: [name], MD;  Location: [facility] CATH LAB;  Service: Cardiovascular;  Laterality: N/A;  . PERIPHERAL VASCULAR CATHETERIZATION N/A 01/19/2016   Procedure: Abdominal Aortogram;  Surgeon: Conrad Lennox, MD;  Location: [place] CV LAB;   Service: Cardiovascular;  Laterality: N/A;  . PERIPHERAL VASCULAR CATHETERIZATION Bilateral 01/19/2016   Procedure: Lower Extremity Angiography;  Surgeon: Conrad [place], MD;  Location: [place] CV LAB;  Service: Cardiovascular;  Laterality: Bilateral;  . PV Angiogram  02/19/13   Indications: slow healing left calf ulcer  . Stress Myocardial Perfusion  12/08/2011   Indications: Abnormal EKG, Eval of prior GABG  . TEE WITHOUT CARDIOVERSION N/A 06/16/2015   Procedure: TRANSESOPHAGEAL ECHOCARDIOGRAM (TEE);  Surgeon: Satira Sark, MD;  Location: [facility];  Service: Cardiovascular;  Laterality: N/A;  . THYROIDECTOMY N/A 09/14/2016   Procedure: TOTAL THYROIDECTOMY;  Surgeon: [name], MD;  Location: [place];  Service: ENT;  Laterality: N/A;  . THYROIDECTOMY    . TONSILLECTOMY  Age 82    There were no vitals filed for this visit.    [facility] PT Assessment - 12/01/17 0001      Assessment   Medical Diagnosis  Wound on left hand    Referring Provider  Sinda Du MD    Onset Date/Surgical Date  11/30/17    Hand Dominance  Right    Prior Therapy  Yes previous wounds on LE and UE      Precautions   Precautions  None      Restrictions   Weight Bearing Restrictions  No      Balance Screen   Has the patient fallen in the past 6 months  No    Has the patient had a decrease in activity level because of a fear of falling?   No    Is the patient reluctant to leave their home because of a fear of falling?   No      Home Social worker  Private residence    Living Arrangements  Alone      Prior Function   Level of Independence  Independent;Independent with basic ADLs      Cognition   Overall Cognitive Status  Within Functional Limits for tasks assessed      Observation/Other Assessments   Observations  Skin discoleration in spots on BUE, bruising on left dorsal aspect of knuckles    Skin Integrity  thin      Wound Therapy - 12/01/17 1333    Subjective   Patient reports hitting his left hand on the corner of his kitchen cabinet yesterday morning. He states it began bleeding a lot and he could not get it to stop by applying bandaids. He is concerned because the would would not stop bleeding which he contributed to being on coumadin. He went to his PCP yestrday to have it checked and they wrapped it with a guaze bandage and referred him here.    Patient and Family Stated Goals  wound to heal    Date of Onset  11/30/17    Prior Treatments  prior wound care for wound on BUE/BLE    Pain Assessment  No/denies pain    Evaluation and Treatment Procedures Explained to Patient/Family  Yes    Evaluation and Treatment Procedures  agreed to    Wound Properties Date First Assessed: 12/01/17 Time First Assessed: 9371 Wound Type: Other (Comment) Location: Hand Location Orientation: Left;Other (Comment) Wound Description (Comments): small superficial abrasion to dorsum of hand 2 inches distal to wrist Present on Admission: Yes   Dressing Type  Impregnated gauze (bismuth);Gauze (Comment);Tape dressing    Dressing Changed  New    Dressing Status  Clean;Dry;Intact    Dressing Change Frequency  -- 1x/week    Site / Wound Assessment  Clean;Pink;Dry    % Wound base Red or Granulating  100%    % Wound base Yellow/Fibrinous Exudate  0%    % Wound base Black/Eschar  0%    % Wound base Other/Granulation Tissue (Comment)  0%    Peri-wound Assessment  Intact    Wound Length (cm)  1 cm    Wound Width (cm)  0.2 cm    Wound Surface Area (cm^2)  0.2 cm^2    Margins  Attached edges (approximated)    Closure  None    Drainage Amount  Scant    Drainage Description  Serous    Treatment  Cleansed;Packing (Impregnated strip);Packing (Dry gauze);Tape changed    Wound Properties Date First Assessed: 12/01/17 Time First Assessed: 1305 Wound Type: Other (Comment) Location: Hand Location Orientation: Left;Other (Comment) Wound Description (Comments): abrasion to dorsum of left hand,  proxmial to 5th MCP joint Present on Admission: Yes   Dressing Type  Impregnated gauze (bismuth);Gauze (Comment);Tape dressing    Dressing Changed  New    Dressing Status  Clean;Dry;Intact    Dressing Change Frequency  -- 1x/week    Site / Wound Assessment  Clean;Dry;Bleeding    % Wound base Red or Granulating  100%    % Wound base Yellow/Fibrinous Exudate  0%    % Wound base Black/Eschar  0%    % Wound base Other/Granulation Tissue (Comment)  0%    Peri-wound Assessment  Intact    Wound Length (cm)  2 cm    Wound Width (cm)  0.8 cm    Wound Surface Area (cm^2)  1.6 cm^2    Margins  Attached edges (approximated)    Drainage Amount  Scant    Drainage Description  Sanguineous    Treatment  Cleansed;Packing (Dry gauze);Packing (Impregnated strip);Tape changed    Wound Therapy - Clinical Statement  Patient presents with two msall abrasions to dorsum of left hand. ABrasions are superficia, clean, dray, and have intact wound margins. Anticipate both wounds will heal well within 2 week period. Will follow 1x/week for 2 weeks.    Wound Therapy - Functional Problem List  skin abrasion    Factors Delaying/Impairing Wound Healing  Vascular compromise;Multiple medical problems    Hydrotherapy Plan  Dressing change;Patient/family education;Debridement    Wound Therapy - Frequency  Other (comment) 1x/week    Wound Therapy - Current Recommendations  PT    Wound Plan  Will treat 1x/week for 2 weeks. Clean, debride, and dress wound.    Dressing   xeroform, gauze medicpore tape    Patient/Family will be able to   dress and keep wound clean, dry, and intact independently    Patient/Family Instruction Goal - Progress  Goal set today    Additional Wound Therapy Goal  wounds to heal    Additional Wound Therapy Goal - Progress  Goal set today    Goals/treatment plan/discharge plan were made with and agreed upon by patient/family  Yes    Time For Goal Achievement  2 weeks    Wound Therapy - Potential for  Goals  Excellent       Objective measurements completed on examination: See above findings.     PT Education - 12/01/17 1401    Education provided  Yes    Education Details  Educated on POC and prognosis for wound to heal. Instructed to keep dressing clean and dry.    Person(s) Educated  Patient    Methods  Explanation    Comprehension  Verbalized understanding       PT Short Term Goals - 12/01/17 1410      PT SHORT TERM GOAL #1   Title  wounds to heal on dorsal aspect of left hand    Time  2    Period  Weeks    Status  New    Target Date  12/15/17        Plan - 12/01/17 1408    Clinical Impression Statement  See Above    Clinical Presentation  Stable    Clinical Decision Making  Low    Rehab Potential  Excellent    PT Frequency  1x / week    PT Duration  2 weeks    PT Treatment/Interventions  Other (comment) Wound Care    PT Next Visit Plan  clean and dress wound    Consulted and Agree with Plan of Care  Patient       Patient will benefit from skilled therapeutic intervention in order to improve the following deficits and impairments:  Decreased skin integrity  Visit Diagnosis: Abrasion of hand and fingers, left, initial encounter  Pain in left hand    Problem List Patient Active Problem List   Diagnosis Date Noted  . Iron deficiency anemia due to chronic blood loss 08/24/2017  . GI bleeding 03/31/2017  . GI bleed 03/30/2017  . Thyroid cancer ([place]) 09/14/2016  . HCAP (healthcare-associated pneumonia) 08/24/2016  . Pneumonia 08/23/2016  . Chronic diastolic congestive heart failure ([place]) 07/24/2016  . Cellulitis of right arm 07/20/2016  . Cellulitis and abscess of hand 07/20/2016  . Venous stasis ulcers ([place]) 12/24/2015  . Chronic venous insufficiency 12/24/2015  . Atherosclerosis of extremity with ulceration ([place]) 12/24/2015  . CHF (congestive heart failure) ([place]) 12/24/2015  . Critical lower limb ischemia 12/24/2015  . Obesity 12/24/2015  . PAF  (paroxysmal atrial fibrillation) ([place]) 12/24/2015  . PAD (peripheral artery disease) ([place]) 06/07/2014  . COPD (chronic obstructive pulmonary disease) ([place]) 01/23/2013  . Anemia 08/03/2012  . Essential hypertension, benign 08/03/2012  . Mixed hyperlipidemia 08/03/2012  . Hx of CABG 08/03/2012     Kipp Brood, PT, DPT Physical Therapist with [facility]  12/01/2017 2:11 PM    [facility] [address] [place], [place], [address] Phone: [phone]   Fax:  [phone]  Name: John Sampson MRN: [id number] Date of Birth: October 30, 1936

## 2017-12-06 ENCOUNTER — Emergency Department (HOSPITAL_COMMUNITY): Payer: Medicare Other

## 2017-12-06 ENCOUNTER — Other Ambulatory Visit: Payer: Self-pay

## 2017-12-06 ENCOUNTER — Emergency Department (HOSPITAL_COMMUNITY)
Admission: EM | Admit: 2017-12-06 | Discharge: 2017-12-06 | Disposition: A | Discharge status: Home / Self Care | Payer: Medicare Other | Attending: Emergency Medicine | Admitting: Emergency Medicine

## 2017-12-06 ENCOUNTER — Ambulatory Visit (HOSPITAL_COMMUNITY): Payer: Medicare Other | Admitting: Physical Therapy

## 2017-12-06 ENCOUNTER — Encounter (HOSPITAL_COMMUNITY): Payer: Self-pay | Admitting: *Deleted

## 2017-12-06 ENCOUNTER — Encounter (INDEPENDENT_AMBULATORY_CARE_PROVIDER_SITE_OTHER): Payer: Self-pay | Admitting: Internal Medicine

## 2017-12-06 ENCOUNTER — Ambulatory Visit (INDEPENDENT_AMBULATORY_CARE_PROVIDER_SITE_OTHER): Payer: Medicare Other | Admitting: Internal Medicine

## 2017-12-06 ENCOUNTER — Encounter (HOSPITAL_COMMUNITY): Payer: Self-pay | Admitting: Physical Therapy

## 2017-12-06 VITALS — BP 140/100 | HR 64 | Temp 98.0°F | Ht 69.0 in | Wt 203.0 lb

## 2017-12-06 DIAGNOSIS — Z951 Presence of aortocoronary bypass graft: Secondary | ICD-10-CM | POA: Diagnosis not present

## 2017-12-06 DIAGNOSIS — Z8679 Personal history of other diseases of the circulatory system: Secondary | ICD-10-CM | POA: Insufficient documentation

## 2017-12-06 DIAGNOSIS — Z79899 Other long term (current) drug therapy: Secondary | ICD-10-CM | POA: Insufficient documentation

## 2017-12-06 DIAGNOSIS — I2510 Atherosclerotic heart disease of native coronary artery without angina pectoris: Secondary | ICD-10-CM | POA: Diagnosis not present

## 2017-12-06 DIAGNOSIS — D500 Iron deficiency anemia secondary to blood loss (chronic): Secondary | ICD-10-CM

## 2017-12-06 DIAGNOSIS — S60419A Abrasion of unspecified finger, initial encounter: Secondary | ICD-10-CM | POA: Diagnosis not present

## 2017-12-06 DIAGNOSIS — M79642 Pain in left hand: Secondary | ICD-10-CM

## 2017-12-06 DIAGNOSIS — Z85850 Personal history of malignant neoplasm of thyroid: Secondary | ICD-10-CM | POA: Insufficient documentation

## 2017-12-06 DIAGNOSIS — I509 Heart failure, unspecified: Secondary | ICD-10-CM | POA: Insufficient documentation

## 2017-12-06 DIAGNOSIS — J449 Chronic obstructive pulmonary disease, unspecified: Secondary | ICD-10-CM | POA: Diagnosis not present

## 2017-12-06 DIAGNOSIS — N3001 Acute cystitis with hematuria: Secondary | ICD-10-CM | POA: Diagnosis not present

## 2017-12-06 DIAGNOSIS — Z7901 Long term (current) use of anticoagulants: Secondary | ICD-10-CM | POA: Diagnosis not present

## 2017-12-06 DIAGNOSIS — Z87891 Personal history of nicotine dependence: Secondary | ICD-10-CM | POA: Insufficient documentation

## 2017-12-06 DIAGNOSIS — I252 Old myocardial infarction: Secondary | ICD-10-CM | POA: Diagnosis not present

## 2017-12-06 DIAGNOSIS — I110 Hypertensive heart disease with heart failure: Secondary | ICD-10-CM | POA: Diagnosis not present

## 2017-12-06 DIAGNOSIS — R05 Cough: Secondary | ICD-10-CM | POA: Diagnosis not present

## 2017-12-06 DIAGNOSIS — R531 Weakness: Secondary | ICD-10-CM | POA: Diagnosis not present

## 2017-12-06 DIAGNOSIS — R0602 Shortness of breath: Secondary | ICD-10-CM | POA: Diagnosis not present

## 2017-12-06 DIAGNOSIS — S60512A Abrasion of left hand, initial encounter: Secondary | ICD-10-CM | POA: Diagnosis not present

## 2017-12-06 LAB — URINALYSIS, ROUTINE W REFLEX MICROSCOPIC
Bilirubin Urine: NEGATIVE
Glucose, UA: NEGATIVE mg/dL
Ketones, ur: NEGATIVE mg/dL
NITRITE: NEGATIVE
Protein, ur: 30 mg/dL — AB
SPECIFIC GRAVITY, URINE: 1.021 (ref 1.005–1.030)
pH: 5 (ref 5.0–8.0)

## 2017-12-06 LAB — BASIC METABOLIC PANEL
Anion gap: 11 (ref 5–15)
BUN: 17 mg/dL (ref 6–20)
CALCIUM: 9 mg/dL (ref 8.9–10.3)
CO2: 23 mmol/L (ref 22–32)
Chloride: 101 mmol/L (ref 101–111)
Creatinine, Ser: 0.97 mg/dL (ref 0.61–1.24)
GFR calc Af Amer: >60 mL/min (ref >60)
GLUCOSE: 100 mg/dL — AB (ref 65–99)
Potassium: 3.7 mmol/L (ref 3.5–5.1)
Sodium: 135 mmol/L (ref 135–145)

## 2017-12-06 LAB — CBC
HCT: 33.3 % — ABNORMAL LOW (ref 39.0–52.0)
Hemoglobin: 9.4 g/dL — ABNORMAL LOW (ref 13.0–17.0)
MCH: 24 pg — ABNORMAL LOW (ref 26.0–34.0)
MCHC: 28.2 g/dL — AB (ref 30.0–36.0)
MCV: 84.9 fL (ref 78.0–100.0)
Platelets: 338 10*3/uL (ref 150–400)
RBC: 3.92 MIL/uL — ABNORMAL LOW (ref 4.22–5.81)
RDW: 15.8 % — AB (ref 11.5–15.5)
WBC: 9.8 10*3/uL (ref 4.0–10.5)

## 2017-12-06 LAB — BRAIN NATRIURETIC PEPTIDE: B Natriuretic Peptide: 305 pg/mL — ABNORMAL HIGH (ref 0.0–100.0)

## 2017-12-06 LAB — TROPONIN I

## 2017-12-06 MED ORDER — SODIUM CHLORIDE 0.9 % IV BOLUS (SEPSIS)
1000.0000 mL | Freq: Once | INTRAVENOUS | Status: AC
  Administered 2017-12-06: 1000 mL via INTRAVENOUS

## 2017-12-06 MED ORDER — CEPHALEXIN 500 MG PO CAPS: 500.0000 mg | ORAL_CAPSULE | Freq: Four times a day (QID) | ORAL | 0 refills | Status: DC

## 2017-12-06 MED ORDER — DEXTROSE 5 % IV SOLN
1.0000 g | Freq: Once | INTRAVENOUS | Status: AC
  Administered 2017-12-06: 1 g via INTRAVENOUS
  Filled 2017-12-06: qty 10

## 2017-12-06 NOTE — ED Notes (Signed)
Pt unable to provide urine specimen at this time. States he feels like his hgb is low, per pt normally runs at 7 hgb, denies seeing blood in stool or urine.

## 2017-12-06 NOTE — ED Provider Notes (Signed)
[facility] EMERGENCY DEPARTMENT Provider Note   CSN: [id number] Arrival date & time: 12/06/17  1130     History   Chief Complaint Chief Complaint  Patient presents with  . Weakness    HPI MELITON SAMAD is a 82 y.o. male.   Weakness  Primary symptoms include no focal weakness. This is a new problem. The current episode started 2 days ago. The problem has not changed since onset.There was no focality noted. There has been no fever. Pertinent negatives include no shortness of breath, no vomiting and no altered mental status. There were no medications administered prior to arrival. Associated medical issues do not include trauma.    Past Medical History:  Diagnosis Date  . Atrial flutter ([place])   . AV malformation of GI tract   . Cancer ([place])    Thyroid  . COPD (chronic obstructive pulmonary disease) ([place]) 3.12.14   2D Echo, EF 50-55%  . Coronary atherosclerosis of native coronary artery    Multivessel status post CABG  . Gastric ulcer    Small - nonbleeding  . GERD (gastroesophageal reflux disease)   . Headache(784.0)   . Iron deficiency anemia    Negative Givens capsule study   . Myocardial infarction ([place])   . PAD (peripheral artery disease) (HCC)    Moderate bilateral SFA disease at angiography 01/2013  . Pituitary macroadenoma [facility])     Patient Active Problem List   Diagnosis Date Noted  . Iron deficiency anemia due to chronic blood loss 08/24/2017  . GI bleeding 03/31/2017  . GI bleed 03/30/2017  . Thyroid cancer ([place]) 09/14/2016  . HCAP (healthcare-associated pneumonia) 08/24/2016  . Pneumonia 08/23/2016  . Chronic diastolic congestive heart failure ([place]) 07/24/2016  . Cellulitis of right arm 07/20/2016  . Cellulitis and abscess of hand 07/20/2016  . Venous stasis ulcers ([place]) 12/24/2015  . Chronic venous insufficiency 12/24/2015  . Atherosclerosis of extremity with ulceration ([place]) 12/24/2015  . CHF (congestive heart failure) ([place]) 12/24/2015  . Critical  lower limb ischemia 12/24/2015  . Obesity 12/24/2015  . PAF (paroxysmal atrial fibrillation) ([place]) 12/24/2015  . PAD (peripheral artery disease) ([place]) 06/07/2014  . COPD (chronic obstructive pulmonary disease) ([place]) 01/23/2013  . Anemia 08/03/2012  . Essential hypertension, benign 08/03/2012  . Mixed hyperlipidemia 08/03/2012  . Hx of CABG 08/03/2012    Past Surgical History:  Procedure Laterality Date  . ABDOMINAL AORTAGRAM N/A 02/19/2013   Procedure: ABDOMINAL Maxcine Ham;  Surgeon: [name], MD;  Location: [facility] CATH LAB;  Service: Cardiovascular;  Laterality: N/A;  . CARDIAC CATHETERIZATION    . CARDIOVERSION N/A 06/16/2015   Procedure: CARDIOVERSION;  Surgeon: Satira Sark, MD;  Location: [facility] ORS;  Service: Cardiovascular;  Laterality: N/A;  . COLONOSCOPY  08/18/2012   Procedure: COLONOSCOPY;  Surgeon: [name], MD;  Location[facility] ENDO SU[facility];  Service: Endoscopy;  Laterality: N/A;  1:25  . COLONOSCOPY N/A 09/12/2017   Procedure: COLONOSCOPY;  Surgeon: [name], MD;  [facility] ENDO SU[facility];  Service: Endoscopy;  Laterality: N/A;  . CORONARY ARTERY BYPASS GRAFT  2003   5 grafts - details not clear  . CRANIOTOMY  12/31/2011   Procedure: CRANIOTOMY HYPOPHYSECTOMY TRANSNASAL APPROACH;  Surgeon: [name], MD;  Location: [place] NEURO ORS;  Service: Neurosurgery;  Laterality: N/A;  Transphenoidal Hypophysectomy With Fat Graft Harvest from right abdomen   . ESOPHAGOGASTRODUODENOSCOPY  08/18/2012   Procedure: ESOPHAGOGASTRODUODENOSCOPY (EGD);  Surgeon: [name], MD;  [facility];  Service: Endoscopy;  Laterality: N/A;  . ESOPHAGOGASTRODUODENOSCOPY N/A 03/31/2017   Procedure: ESOPHAGOGASTRODUODENOSCOPY (EGD);  Surgeon: [name], MD;  [facility];  Service: Endoscopy;  Laterality: N/A;  . ESOPHAGOGASTRODUODENOSCOPY N/A 09/12/2017   Procedure: ESOPHAGOGASTRODUODENOSCOPY (EGD);  Surgeon: [name], MD;  [facility];   Service: Endoscopy;  Laterality: N/A;  10:40  . EYE SURGERY  2012  . GIVENS CAPSULE STUDY N/A 01/25/2013   Procedure: GIVENS CAPSULE STUDY;  Surgeon: [name], MD;  [facility];  Service: Endoscopy;  Laterality: N/A;  730  . HOT HEMOSTASIS  03/31/2017   Procedure: HOT HEMOSTASIS (ARGON PLASMA COAGULATION/BICAP);  Surgeon: [name], MD;  [facility];  Service: Endoscopy;;  duodenum  . LARYNGOPLASTY Left 09/14/2016   Procedure: LARYNGOPLASTY;  Surgeon: [name], MD;  Location: [place];  Service: ENT;  Laterality: Left;  . LOWER EXTREMITY ANGIOGRAM N/A 02/19/2013   Procedure: LOWER EXTREMITY ANGIOGRAM;  Surgeon: [name], MD;  Location: [facility] CATH LAB;  Service: Cardiovascular;  Laterality: N/A;  . PERIPHERAL VASCULAR CATHETERIZATION N/A 01/19/2016   Procedure: Abdominal Aortogram;  Surgeon: Conrad [place], MD;  Location: [place] CV LAB;  Service: Cardiovascular;  Laterality: N/A;  . PERIPHERAL VASCULAR CATHETERIZATION Bilateral 01/19/2016   Procedure: Lower Extremity Angiography;  Surgeon: Conrad Concord, MD;  Location: [place] CV LAB;  Service: Cardiovascular;  Laterality: Bilateral;  . PV Angiogram  02/19/13   Indications: slow healing left calf ulcer  . Stress Myocardial Perfusion  12/08/2011   Indications: Abnormal EKG, Eval of prior GABG  . TEE WITHOUT CARDIOVERSION N/A 06/16/2015   Procedure: TRANSESOPHAGEAL ECHOCARDIOGRAM (TEE);  Surgeon: Satira Sark, MD;  Location: [facility] ORS;  Service: Cardiovascular;  Laterality: N/A;  . THYROIDECTOMY N/A 09/14/2016   Procedure: TOTAL THYROIDECTOMY;  Surgeon: [name], MD;  Location: [place];  Service: ENT;  Laterality: N/A;  . THYROIDECTOMY    . TONSILLECTOMY  Age 33       Home Medications    Prior to Admission medications   Medication Sig Start Date End Date Taking? Authorizing Provider  albuterol (PROVENTIL HFA) 108 (90 BASE) MCG/ACT inhaler Inhale 2 puffs into the lungs every 6  (six) hours as needed for wheezing or shortness of breath. 11/15/14  Yes Satira Sark, MD  apixaban (ELIQUIS) 5 MG TABS tablet Take 1 tablet (5 mg total) by mouth 2 (two) times daily. Hold until Monday then start 1 tablet twice a day as before 09/15/17  Yes Rehman, Mechele Dawley, MD  diltiazem (CARDIZEM CD) 240 MG 24 hr capsule Take 1 capsule (240 mg total) by mouth daily. 05/30/15  Yes Lendon Colonel, NP  folic acid (FOLVITE) 1 MG tablet Take 1 mg by mouth daily.    Yes [provider]  furosemide (LASIX) 20 MG tablet Take 1 tablet (20 mg total) by mouth daily. Patient taking differently: Take 20 mg by mouth daily as needed for fluid.  03/01/16  Yes Satira Sark, MD  levothyroxine (SYNTHROID, LEVOTHROID) 125 MCG tablet Take 125 mcg by mouth daily before breakfast.    Yes [provider]  lisinopril (PRINIVIL,ZESTRIL) 10 MG tablet Take 1 tablet (10 mg total) by mouth daily. 05/30/15  Yes Lendon Colonel, NP  metoprolol succinate (TOPROL-XL) 50 MG 24 hr tablet Take 50 mg by mouth 2 (two) times daily. Take with or immediately following a meal.   Yes [provider]  nitroGLYCERIN (NITROSTAT)  0.4 MG SL tablet Place 1 tablet (0.4 mg total) under the tongue every 5 (five) minutes as needed for chest pain. 06/07/14  Yes Satira Sark, MD  omeprazole (PRILOSEC) 20 MG capsule Take 20 mg by mouth 2 (two) times daily before a meal.   Yes [provider]  potassium chloride SA (K-DUR,KLOR-CON) 20 MEQ tablet Take 20 mEq by mouth daily.   Yes [provider]  simvastatin (ZOCOR) 40 MG tablet Take 0.5 tablets (20 mg total) by mouth at bedtime. 07/25/16  Yes Sinda Du, MD  cephALEXin (KEFLEX) 500 MG capsule Take 1 capsule (500 mg total) by mouth 4 (four) times daily. 12/06/17   Akanksha Bellmore, Corene Cornea, MD    Family History Family History  Problem Relation Age of Onset  . CAD Mother   . Heart disease Father        before age 50    Social History Social  History   Tobacco Use  . Smoking status: Former Smoker    Packs/day: 3.00    Years: 45.00    Pack years: 135.00    Types: Cigarettes    Start date: 01/24/1955    Last attempt to quit: 01/23/2002    Years since quitting: 15.8  . Smokeless tobacco: Never Used  . Tobacco comment: Quit 11 yrs ago  Substance Use Topics  . Alcohol use: No    Alcohol/week: 0.0 oz  . Drug use: No     Allergies   No known allergies   Review of Systems Review of Systems  Respiratory: Negative for shortness of breath.   Gastrointestinal: Negative for vomiting.  Neurological: Positive for weakness. Negative for focal weakness.  All other systems reviewed and are negative.    Physical Exam Updated Vital Signs BP (!) 142/62   Pulse 70   Temp 98.5 F (36.9 C) (Oral)   Resp 20   Ht 5\' 9"  (1.753 m)   Wt 92.1 kg (203 lb)   SpO2 97%   BMI 29.98 kg/m   Physical Exam  Constitutional: He appears well-developed and well-nourished.  HENT:  Head: Normocephalic and atraumatic.  Eyes: Conjunctivae and EOM are normal.  Neck: Normal range of motion.  Cardiovascular: Normal rate.  Pulmonary/Chest: Effort normal. No respiratory distress.  Abdominal: Soft. He exhibits no distension.  Musculoskeletal: Normal range of motion.  Neurological: He is alert. He displays normal reflexes. No cranial nerve deficit or sensory deficit. He exhibits normal muscle tone. Coordination normal.  Skin: Skin is warm and dry.  Nursing note and vitals reviewed.    ED Treatments / Results  Labs (all labs ordered are listed, but only abnormal results are displayed) Labs Reviewed  BASIC METABOLIC PANEL - Abnormal; Notable for the following components:      Result Value   Glucose, Bld 100 (*)    All other components within normal limits  CBC - Abnormal; Notable for the following components:   RBC 3.92 (*)    Hemoglobin 9.4 (*)    HCT 33.3 (*)    MCH 24.0 (*)    MCHC 28.2 (*)    RDW 15.8 (*)    All other components  within normal limits  URINALYSIS, ROUTINE W REFLEX MICROSCOPIC - Abnormal; Notable for the following components:   APPearance HAZY (*)    Hgb urine dipstick LARGE (*)    Protein, ur 30 (*)    Leukocytes, UA TRACE (*)    Bacteria, UA RARE (*)    Squamous Epithelial / LPF 0-5 (*)  All other components within normal limits  BRAIN NATRIURETIC PEPTIDE - Abnormal; Notable for the following components:   B Natriuretic Peptide 305.0 (*)    All other components within normal limits  URINE CULTURE  TROPONIN I    EKG  EKG Interpretation  Date/Time:  Tuesday December 06 2017 12:17:06 EST Ventricular Rate:  64 PR Interval:  168 QRS Duration: 100 QT Interval:  414 QTC Calculation: 427 R Axis:   17 Text Interpretation:  Normal sinus rhythm Left ventricular hypertrophy with repolarization abnormality Abnormal ECG No significant change since last tracing Confirmed by Merrily Pew [phone]) on 12/06/2017 2:30:31 PM       Radiology Dg Chest 2 View  Result Date: 12/06/2017 CLINICAL DATA:  Increasing shortness of breath, weakness and cough for 1 week. History of COPD. EXAM: CHEST  2 VIEW COMPARISON:  CT chest 08/23/2016. The PA and lateral chest 08/23/2016, 03/30/2017 and 04/27/2017. FINDINGS: The lungs are emphysematous. There is mild cardiomegaly and interstitial pulmonary edema. No pneumothorax or pleural effusion. Eventration of the right hemidiaphragm is noted. The patient is status post CABG. No acute bony abnormality. IMPRESSION: Cardiomegaly and mild interstitial edema. Emphysema. Electronically Signed   By: Inge Rise M.D.   On: 12/06/2017 15:09    Procedures Procedures (including critical care time)  Medications Ordered in ED Medications  sodium chloride 0.9 % bolus 1,000 mL (0 mLs Intravenous Stopped 12/06/17 1730)  cefTRIAXone (ROCEPHIN) 1 g in dextrose 5 % 50 mL IVPB (0 g Intravenous Stopped 12/06/17 1712)     Initial Impression / Assessment and Plan / ED Course  I have  reviewed the triage vital signs and the nursing notes.  Pertinent labs & imaging results that were available during my care of the patient were reviewed by me and considered in my medical decision making (see chart for details).    Generalized weakness. Suspect dehydration. Possibly infectious vs metabolic. Will eval appropriately.   Suspect UTI as the cause for his symptoms.  Also with fluid of pulmonary edema, but able to ambulate without hypoxia or resp distress, so he will take his Lasix for the next few days and follow with Dr. Luan Pulling to make sure his urine infection is improved. Strict  Return precautions.   Final Clinical Impressions(s) / ED Diagnoses   Final diagnoses:  Acute cystitis with hematuria    ED Discharge Orders        Ordered    cephALEXin (KEFLEX) 500 MG capsule  4 times daily     12/06/17 1824       Rafeef Lau, Corene Cornea, MD 12/06/17 1842

## 2017-12-06 NOTE — Patient Instructions (Signed)
Cbc today

## 2017-12-06 NOTE — ED Notes (Signed)
Pt ambulated with a steady gait, maintained an o2 level of 93. RN aware.

## 2017-12-06 NOTE — Progress Notes (Signed)
Subjective:    Patient ID: John Sampson, male    DOB: [birth year], 82 y.o.   MRN: [id number]  HPI Presents today with c/o that he thinks his hemoglobin is low since Saturday. States he has no energy and is weak.  No black stools. Last hemoglobin in October was 8.8. Was to have another H and H two weeks after but that lab is not in Epic. Hx of IDA.   Stool card in October was negative.  Underwent and EGD and Colonoscopy in October of this year.   CBC Latest Ref Rng & Units 09/23/2017 09/15/2017 09/12/2017  WBC 3.8 - 10.8 Thousand/uL - - -  Hemoglobin 13.2 - 17.1 g/dL 8.8(L) 8.1(L) 8.1(L)  Hematocrit 38.5 - 50.0 % 30.0(L) 27.6(L) 28.3(L)  Platelets 140 - 400 Thousand/uL - - -        Takes Eliquis for paroxysmal atrial fib. Hx of CABG., CHF, COPD  09/12/2017 EGD anemia   Impression:               - Normal esophagus.                           - Z-line irregular, 42 cm from the incisors.                           - Scar in the prepyloric region of the stomach.                           - Non-bleeding gastric ulcers with no stigmata of                            bleeding. These ulcers appear to be healing.                           - Normal duodenal bulb.                           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 Treated with argon plasma coagulation                            (APC). Colonoscopy:  High risk colon cancer screenin  Impression:               - Diverticulosis in the sigmoid colon and in the                            ascending colon.                           - External hemorrhoids.                           - Anal papilla(e) were hypertrophied.                           - No specimens collected.  03/31/2017 EGD: Melena and anemia: Impression: - Normal esophagus. - Z-line regular, 42 cm from the incisors. - Non-bleeding gastric ulcers  with no stigmata of  bleeding. - Scars in the prepyloric region of the stomach. - A few gastric polyps. - Normal duodenal bulb. - Duodenal diverticulum. - Duodenal lipoma. - Four non-bleeding angiodysplastic lesions in the  duodenum. Treated with argon plasma coagulation  (APC). - No specimens collected.   CBC    Component Value Date/Time   WBC 8.0 08/22/2017 0739   RBC 3.33 (L) 08/22/2017 0739   HGB 8.8 (L) 09/23/2017 0900   HCT 30.0 (L) 09/23/2017 0900   PLT 287 08/22/2017 0739   MCV 84.4 08/22/2017 0739   MCH 25.5 (L) 08/22/2017 0739   MCHC 30.2 (L) 08/22/2017 0739   RDW 13.4 08/22/2017 0739   LYMPHSABS 1,365 05/12/2017 1032   MONOABS 910 05/12/2017 1032   EOSABS 182 05/12/2017 1032   BASOSABS 0 05/12/2017 1032     Review of Systems Past Medical History:  Diagnosis Date  . Atrial flutter ([place])   . AV malformation of GI tract   . Cancer ([place])    Thyroid  . COPD (chronic obstructive pulmonary disease) ([place]) 3.12.14   2D Echo, EF 50-55%  . Coronary atherosclerosis of native coronary artery    Multivessel status post CABG  . Gastric ulcer    Small - nonbleeding  . GERD (gastroesophageal reflux disease)   . Headache(784.0)   . Iron deficiency anemia    Negative Givens capsule study   . Myocardial infarction ([place])   . PAD (peripheral artery disease) (HCC)    Moderate bilateral SFA disease at angiography 01/2013  . Pituitary macroadenoma [facility])     Past Surgical History:  Procedure Laterality Date  . ABDOMINAL AORTAGRAM N/A 02/19/2013   Procedure: ABDOMINAL Maxcine Ham;  Surgeon: [name], MD;  Location: [facility] CATH LAB;  Service: Cardiovascular;  Laterality: N/A;  . CARDIAC CATHETERIZATION    .  CARDIOVERSION N/A 06/16/2015   Procedure: CARDIOVERSION;  Surgeon: Satira Sark, MD;  [facility] OR[facility];  Service: Cardiovascular;  Laterality: N/A;  . COLONOSCOPY  08/18/2012   Procedure: COLONOSCOPY;  Surgeon: [name], MD;  [facility];  Service: Endoscopy;  Laterality: N/A;  1:25  . COLONOSCOPY N/A 09/12/2017   Procedure: COLONOSCOPY;  Surgeon: [name], MD;  [facility];  Service: Endoscopy;  Laterality: N/A;  . CORONARY ARTERY BYPASS GRAFT  2003   5 grafts - details not clear  . CRANIOTOMY  12/31/2011   Procedure: CRANIOTOMY HYPOPHYSECTOMY TRANSNASAL APPROACH;  Surgeon: [name], MD;  Location: [place] NEURO ORS;  Service: Neurosurgery;  Laterality: N/A;  Transphenoidal Hypophysectomy With Fat Graft Harvest from right abdomen   . ESOPHAGOGASTRODUODENOSCOPY  08/18/2012   Procedure: ESOPHAGOGASTRODUODENOSCOPY (EGD);  Surgeon: [name], MD;  [facility];  Service: Endoscopy;  Laterality: N/A;  . ESOPHAGOGASTRODUODENOSCOPY N/A 03/31/2017   Procedure: ESOPHAGOGASTRODUODENOSCOPY (EGD);  Surgeon: [name], MD;  [facility];  Service: Endoscopy;  Laterality: N/A;  . ESOPHAGOGASTRODUODENOSCOPY N/A 09/12/2017   Procedure: ESOPHAGOGASTRODUODENOSCOPY (EGD);  Surgeon: [name], MD;  [facility];  Service: Endoscopy;  Laterality: N/A;  10:40  . EYE SURGERY  2012  . GIVENS CAPSULE STUDY N/A 01/25/2013   Procedure: GIVENS CAPSULE STUDY;  Surgeon: [name], MD;  [facility];  Service: Endoscopy;  Laterality: N/A;  730  . HOT HEMOSTASIS  03/31/2017  Procedure: HOT HEMOSTASIS (ARGON PLASMA COAGULATION/BICAP);  Surgeon: [name], MD;  [facility];  Service: Endoscopy;;  duodenum  . LARYNGOPLASTY Left 09/14/2016   Procedure: LARYNGOPLASTY;  Surgeon: [name], MD;  Location: [place];  Service: ENT;  Laterality: Left;  . LOWER EXTREMITY ANGIOGRAM N/A 02/19/2013    Procedure: LOWER EXTREMITY ANGIOGRAM;  Surgeon: [name], MD;  Location[facility] CATH LAB;  Service: Cardiovascular;  Laterality: N/A;  . PERIPHERAL VASCULAR CATHETERIZATION N/A 01/19/2016   Procedure: Abdominal Aortogram;  Surgeon: Conrad [place], MD;  Location: [place] CV LAB;  Service: Cardiovascular;  Laterality: N/A;  . PERIPHERAL VASCULAR CATHETERIZATION Bilateral 01/19/2016   Procedure: Lower Extremity Angiography;  Surgeon: Conrad Stem, MD;  Location: [place] CV LAB;  Service: Cardiovascular;  Laterality: Bilateral;  . PV Angiogram  02/19/13   Indications: slow healing left calf ulcer  . Stress Myocardial Perfusion  12/08/2011   Indications: Abnormal EKG, Eval of prior GABG  . TEE WITHOUT CARDIOVERSION N/A 06/16/2015   Procedure: TRANSESOPHAGEAL ECHOCARDIOGRAM (TEE);  Surgeon: Satira Sark, MD;  Location[facility];  Service: Cardiovascular;  Laterality: N/A;  . THYROIDECTOMY N/A 09/14/2016   Procedure: TOTAL THYROIDECTOMY;  Surgeon: [name], MD;  Location: [place];  Service: ENT;  Laterality: N/A;  . THYROIDECTOMY    . TONSILLECTOMY  Age 58    Allergies  Allergen Reactions  . No Known Allergies     Current Outpatient Medications on File Prior to Visit  Medication Sig Dispense Refill  . acetaminophen (TYLENOL) 325 MG tablet Take 650 mg by mouth daily as needed for headache.    . albuterol (PROVENTIL HFA) 108 (90 BASE) MCG/ACT inhaler Inhale 2 puffs into the lungs every 6 (six) hours as needed for wheezing or shortness of breath. 1 Inhaler 3  . apixaban (ELIQUIS) 5 MG TABS tablet Take 1 tablet (5 mg total) by mouth 2 (two) times daily. Hold until Monday then start 1 tablet twice a day as before 60 tablet 3  . diltiazem (CARDIZEM CD) 240 MG 24 hr capsule Take 1 capsule (240 mg total) by mouth daily. 90 capsule 3  . fish oil-omega-3 fatty acids 1000 MG capsule Take 1 g by mouth 2 (two) times daily.     . folic acid (FOLVITE) 1 MG tablet Take 1 mg by mouth daily.       . furosemide (LASIX) 20 MG tablet Take 1 tablet (20 mg total) by mouth daily. (Patient taking differently: Take 20 mg by mouth daily as needed for fluid. ) 90 tablet 3  . levothyroxine (SYNTHROID, LEVOTHROID) 137 MCG tablet Take 137 mcg by mouth daily before breakfast.     . lisinopril (PRINIVIL,ZESTRIL) 10 MG tablet Take 1 tablet (10 mg total) by mouth daily. 90 tablet 3  . metoprolol succinate (TOPROL-XL) 50 MG 24 hr tablet Take 50 mg by mouth 2 (two) times daily. Take with or immediately following a meal.    . multivitamin (ONE-A-DAY MEN'S) TABS tablet Take 1 tablet by mouth daily.    . nitroGLYCERIN (NITROSTAT) 0.4 MG SL tablet Place 1 tablet (0.4 mg total) under the tongue every 5 (five) minutes as needed for chest pain. 30 tablet 9  . omeprazole (PRILOSEC) 20 MG capsule Take 20 mg by mouth 2 (two) times daily before a meal.    . Pediatric Multivitamins-Iron (FLINTSTONES PLUS IRON) chewable tablet Chew 1 tablet by mouth 2 (two) times daily.    . potassium chloride SA (  K-DUR,KLOR-CON) 20 MEQ tablet Take 20 mEq by mouth daily.    . simvastatin (ZOCOR) 40 MG tablet Take 0.5 tablets (20 mg total) by mouth at bedtime. 30 tablet 12   No current facility-administered medications on file prior to visit.         Objective:   Physical Exam Blood pressure (!) 140/100, pulse 64, temperature 98 F (36.7 C), height 5\' 9"  (1.753 m), weight 203 lb (92.1 kg). Alert and oriented. Skin warm and dry. Oral mucosa is moist.   . Sclera anicteric, conjunctivae is pink. Thyroid not enlarged. No cervical lymphadenopathy. Lungs clear. Heart regular rate and rhythm.  Abdomen is soft. Bowel sounds are positive. No hepatomegaly. No abdominal masses felt. No tenderness.  No edema to lower extremities.  Stool brown and guaiac positive (Not grossly)        Assessment & Plan:  Heme positive stool.  IDA. Patient is extremely weak and SOB (Does have hx of COPD).  I discussed with Dr Laural Golden. Patient needs to go to the  ED Report to Dr. Wilson Singer.

## 2017-12-06 NOTE — Therapy (Signed)
[place], [place], [address] Phone: [phone]   Fax:  [phone]  Wound Care Therapy  Patient Details  Name: John Sampson MRN: [id number] Date of Birth: 03/10/1936 Referring Provider: Sinda Du MD   Encounter Date: 12/06/2017  PT End of Session - 12/06/17 1103    Visit Number  2    Number of Visits  3    Date for PT Re-Evaluation  12/15/17    Authorization Type  Medicare/Medicare Part A and B    Authorization Time Period  01/01/2018-01/15/2018    Authorization - Visit Number  2    Authorization - Number of Visits  3    PT Start Time  1000    PT Stop Time  1020    PT Time Calculation (min)  20 min    Activity Tolerance  Patient tolerated treatment well    Behavior During Therapy  [facility] for tasks assessed/performed       Past Medical History:  Diagnosis Date  . Atrial flutter ([place])   . AV malformation of GI tract   . Cancer ([place])    Thyroid  . COPD (chronic obstructive pulmonary disease) ([place]) 3.12.14   2D Echo, EF 50-55%  . Coronary atherosclerosis of native coronary artery    Multivessel status post CABG  . Gastric ulcer    Small - nonbleeding  . GERD (gastroesophageal reflux disease)   . Headache(784.0)   . Iron deficiency anemia    Negative Givens capsule study   . Myocardial infarction ([place])   . PAD (peripheral artery disease) (H[facility])    Moderate bilateral SFA disease at angiography 01/2013  . Pituitary macroadenoma [facility])     Past Surgical History:  Procedure Laterality Date  . ABDOMINAL AORTAGRAM N/A 02/19/2013   Procedure: ABDOMINAL Maxcine Ham;  Surgeon: [name], MD;  Location: [facility] CATH LAB;  Service: Cardiovascular;  Laterality: N/A;  . CARDIAC CATHETERIZATION    . CARDIOVERSION N/A 06/16/2015   Procedure: CARDIOVERSION;  Surgeon: Satira Sark, MD;  Location: [facility] ORS;  Service: Cardiovascular;  Laterality: N/A;  . COLONOSCOPY  08/18/2012   Procedure: COLONOSCOPY;  Surgeon:  [name], MD;  Location: [facility] ENDO SUITE;  Service: Endoscopy;  Laterality: N/A;  1:25  . COLONOSCOPY N/A 09/12/2017   Procedure: COLONOSCOPY;  Surgeon: [name], MD;  [facility] ENDO SUITE;  Service: Endoscopy;  Laterality: N/A;  . CORONARY ARTERY BYPASS GRAFT  2003   5 grafts - details not clear  . CRANIOTOMY  12/31/2011   Procedure: CRANIOTOMY HYPOPHYSECTOMY TRANSNASAL APPROACH;  Surgeon: [name], MD;  Location: [place] NEURO ORS;  Service: Neurosurgery;  Laterality: N/A;  Transphenoidal Hypophysectomy With Fat Graft Harvest from right abdomen   . ESOPHAGOGASTRODUODENOSCOPY  08/18/2012   Procedure: ESOPHAGOGASTRODUODENOSCOPY (EGD);  Surgeon: [name], MD;  [facility] END[facility];  Service: Endoscopy;  Laterality: N/A;  . ESOPHAGOGASTRODUODENOSCOPY N/A 03/31/2017   Procedure: ESOPHAGOGASTRODUODENOSCOPY (EGD);  Surgeon: [name], MD;  [facility] END[facility];  Service: Endoscopy;  Laterality: N/A;  . ESOPHAGOGASTRODUODENOSCOPY N/A 09/12/2017   Procedure: ESOPHAGOGASTRODUODENOSCOPY (EGD);  Surgeon: [name], MD;  [facility] END[facility];  Service: Endoscopy;  Laterality: N/A;  10:40  . EYE SURGERY  2012  . GIVENS CAPSULE STUDY N/A 01/25/2013   Procedure: GIVENS CAPSULE STUDY;  Surgeon: [name], MD;  [facility] END[facility];  Service: Endoscopy;  Laterality: N/A;  730  . HOT HEMOSTASIS  03/31/2017  Procedure: HOT HEMOSTASIS (ARGON PLASMA COAGULATION/BICAP);  Surgeon: [name], MD;  [facility] ENDO SU[facility];  Service: Endoscopy;;  duodenum  . LARYNGOPLASTY Left 09/14/2016   Procedure: LARYNGOPLASTY;  Surgeon: [name], MD;  Location: [place];  Service: ENT;  Laterality: Left;  . LOWER EXTREMITY ANGIOGRAM N/A 02/19/2013   Procedure: LOWER EXTREMITY ANGIOGRAM;  Surgeon: [name], MD;  Location: [facility] CATH LAB;  Service: Cardiovascular;  Laterality: N/A;  . PERIPHERAL VASCULAR CATHETERIZATION N/A 01/19/2016   Procedure: Abdominal  Aortogram;  Surgeon: Conrad Roberts, MD;  Location: [place] CV LAB;  Service: Cardiovascular;  Laterality: N/A;  . PERIPHERAL VASCULAR CATHETERIZATION Bilateral 01/19/2016   Procedure: Lower Extremity Angiography;  Surgeon: Conrad Westfield, MD;  Location: [place] CV LAB;  Service: Cardiovascular;  Laterality: Bilateral;  . PV Angiogram  02/19/13   Indications: slow healing left calf ulcer  . Stress Myocardial Perfusion  12/08/2011   Indications: Abnormal EKG, Eval of prior GABG  . TEE WITHOUT CARDIOVERSION N/A 06/16/2015   Procedure: TRANSESOPHAGEAL ECHOCARDIOGRAM (TEE);  Surgeon: Satira Sark, MD;  Location: [facility] ORS;  Service: Cardiovascular;  Laterality: N/A;  . THYROIDECTOMY N/A 09/14/2016   Procedure: TOTAL THYROIDECTOMY;  Surgeon: [name], MD;  Location: [place];  Service: ENT;  Laterality: N/A;  . THYROIDECTOMY    . TONSILLECTOMY  Age 82    There were no vitals filed for this visit.              Wound Therapy - 12/06/17 1057    Subjective  Patient states that he is having slight pain in his hand; states that he does not feel as if he has any energy and is going to get his hemoglobin checked after this visit.      Patient and Family Stated Goals  wound to heal    Date of Onset  11/30/17    Prior Treatments  prior wound care for wound on BUE/BLE    Evaluation and Treatment Procedures Explained to Patient/Family  Yes    Evaluation and Treatment Procedures  agreed to    Wound Properties Date First Assessed: 12/01/17 Time First Assessed: 1305 Wound Type: Other (Comment) Location: Hand Location Orientation: Left;Other (Comment) Wound Description (Comments): small superficial abrasion to dorsum of hand 2 inches distal to wrist Present on Admission: Yes   Dressing Type  Impregnated gauze (bismuth);Gauze (Comment);Tape dressing    Dressing Changed  Changed    Dressing Status  Clean;Dry;Intact    Dressing Change Frequency  PRN 1x/week    Site / Wound Assessment   Clean;Dry;Brown;Yellow    % Wound base Red or Granulating  40%    % Wound base Yellow/Fibrinous Exudate  60%    % Wound base Black/Eschar  0%    % Wound base Other/Granulation Tissue (Comment)  0%    Peri-wound Assessment  Intact    Margins  Attached edges (approximated)    Closure  None    Drainage Amount  Scant    Drainage Description  Serous    Treatment  Cleansed;Debridement (Selective)    Wound Properties Date First Assessed: 12/01/17 Time First Assessed: 1305 Wound Type: Other (Comment) Location: Hand Location Orientation: Left;Other (Comment) Wound Description (Comments): abrasion to dorsum of left hand, proxmial to 5th MCP joint Present on Admission: Yes   Dressing Type  Impregnated gauze (bismuth);Gauze (Comment);Tape dressing    Dressing Changed  Changed    Dressing Status  Clean;Dry;Intact    Dressing Change Frequency  PRN 1x/week    Site / Wound Assessment  Clean;Dry;Bleeding    % Wound base Red or Granulating  60%    % Wound base Yellow/Fibrinous Exudate  40%    % Wound base Black/Eschar  0%    % Wound base Other/Granulation Tissue (Comment)  0%    Peri-wound Assessment  Intact    Margins  Attached edges (approximated)    Drainage Amount  Scant    Drainage Description  Sanguineous    Treatment  Cleansed;Debridement (Selective)    Wound Therapy - Clinical Statement  Pt wounds currently covered with adherent eschar which was able to be debrided.  Pt has no swelling or redness.  Pt lives alone and has difficulty caring for the wound on his left hand and therefore will continue to benefit from skilled physical therapy for wound care until pt does not exhibit any eschar on the wound bed.      Wound Therapy - Functional Problem List  skin abrasion    Factors Delaying/Impairing Wound Healing  Vascular compromise;Multiple medical problems    Hydrotherapy Plan  Dressing change;Patient/family education;Debridement    Wound Therapy - Frequency  Other (comment) 1x/week    Wound  Therapy - Current Recommendations  PT    Wound Plan  Will treat 2x/week for 2 weeks. Clean, debride, and dress wound.    Dressing   xeroform, gauze medicpore tape    Patient/Family will be able to   dress and keep wound clean, dry, and intact independently    Additional Wound Therapy Goal  wounds to heal    Goals/treatment plan/discharge plan were made with and agreed upon by patient/family  Yes    Time For Goal Achievement  2 weeks    Wound Therapy - Potential for Goals  Excellent                PT Short Term Goals - 12/06/17 1105      PT SHORT TERM GOAL #1   Title  wounds to heal on dorsal aspect of left hand    Time  2    Period  Weeks    Status  On-going               Plan - 12/06/17 1104    Clinical Impression Statement  see above     Rehab Potential  Excellent    PT Frequency  1x / week    PT Duration  2 weeks    PT Treatment/Interventions  Other (comment) Wound Care    PT Next Visit Plan  clean , debride if necessary and dress wound    Consulted and Agree with Plan of Care  Patient       Patient will benefit from skilled therapeutic intervention in order to improve the following deficits and impairments:  Decreased skin integrity  Visit Diagnosis: Abrasion of hand and fingers, left, initial encounter  Pain in left hand     Problem List Patient Active Problem List   Diagnosis Date Noted  . Iron deficiency anemia due to chronic blood loss 08/24/2017  . GI bleeding 03/31/2017  . GI bleed 03/30/2017  . Thyroid cancer ([place]) 09/14/2016  . HCAP (healthcare-associated pneumonia) 08/24/2016  . Pneumonia 08/23/2016  . Chronic diastolic congestive heart failure ([place]) 07/24/2016  . Cellulitis of right arm 07/20/2016  . Cellulitis and abscess of hand 07/20/2016  . Venous stasis ulcers ([place]) 12/24/2015  . Chronic venous insufficiency 12/24/2015  . Atherosclerosis of extremity with ulceration ([place]) 12/24/2015  .  CHF (congestive heart failure) ([place])  12/24/2015  . Critical lower limb ischemia 12/24/2015  . Obesity 12/24/2015  . PAF (paroxysmal atrial fibrillation) ([place]) 12/24/2015  . PAD (peripheral artery disease) ([place]) 06/07/2014  . COPD (chronic obstructive pulmonary disease) ([place]) 01/23/2013  . Anemia 08/03/2012  . Essential hypertension, benign 08/03/2012  . Mixed hyperlipidemia 08/03/2012  . Hx of CABG 08/03/2012    Rayetta Humphrey, PT CLT [phone] 12/06/2017, 11:05 AM  [place] [address] [place], [place], [address] Phone: [phone]   Fax:  [phone]  Name: MAHMUD KEITHLY MRN: [id number] Date of Birth: July 01, 1936

## 2017-12-06 NOTE — ED Triage Notes (Addendum)
Pt called Dr. Olevia Perches office and was instructed to come to ED.  Pt states that he has been having trouble with his Hbg and has been low.

## 2017-12-06 NOTE — Discharge Instructions (Signed)
Please take your lasix daily for the next 5 days

## 2017-12-08 LAB — URINE CULTURE: Culture: 10000 — AB

## 2017-12-09 ENCOUNTER — Encounter (HOSPITAL_COMMUNITY): Payer: Self-pay | Admitting: Physical Therapy

## 2017-12-09 ENCOUNTER — Ambulatory Visit (HOSPITAL_COMMUNITY): Payer: Medicare Other | Admitting: Physical Therapy

## 2017-12-09 ENCOUNTER — Telehealth: Payer: Self-pay | Admitting: *Deleted

## 2017-12-09 DIAGNOSIS — S60419A Abrasion of unspecified finger, initial encounter: Secondary | ICD-10-CM | POA: Diagnosis not present

## 2017-12-09 DIAGNOSIS — M79642 Pain in left hand: Secondary | ICD-10-CM | POA: Diagnosis not present

## 2017-12-09 DIAGNOSIS — S60512A Abrasion of left hand, initial encounter: Principal | ICD-10-CM

## 2017-12-09 NOTE — Telephone Encounter (Signed)
Post ED Visit - Positive Culture Follow-up  Culture report reviewed by antimicrobial stewardship pharmacist:  []  Elenor Quinones, Pharm.D. []  Heide Guile, Pharm.D., BCPS AQ-ID []  Parks Neptune, Pharm.D., BCPS []  Alycia Rossetti, Pharm.D., BCPS []  Roper, Pharm.D., BCPS, AAHIVP []  Legrand Como, Pharm.D., BCPS, AAHIVP []  Salome Arnt, PharmD, BCPS [x]  Jalene Mullet, PharmD []  Vincenza Hews, PharmD, BCPS  Positive urine culture Treated with Cephalexin, organism sensitive to the same and no further patient follow-up is required at this time.  Harlon Flor [facility] 12/09/2017, 10:42 AM

## 2017-12-09 NOTE — Therapy (Signed)
[place] [address] [place], [place], [address] Phone: [phone]   Fax:  [phone]  Wound Care Therapy  Patient Details  Name: John Sampson MRN: [id number] Date of Birth: Sep 25, 1936 Referring Provider: Sinda Du    Encounter Date: 12/09/2017    Past Medical History:  Diagnosis Date  . Atrial flutter ([place])   . AV malformation of GI tract   . Cancer ([place])    Thyroid  . COPD (chronic obstructive pulmonary disease) ([place]) 3.12.14   2D Echo, EF 50-55%  . Coronary atherosclerosis of native coronary artery    Multivessel status post CABG  . Gastric ulcer    Small - nonbleeding  . GERD (gastroesophageal reflux disease)   . Headache(784.0)   . Iron deficiency anemia    Negative Givens capsule study   . Myocardial infarction ([place])   . PAD (peripheral artery disease) (H[facility])    Moderate bilateral SFA disease at angiography 01/2013  . [facility])     Past Surgical History:  Procedure Laterality Date  . ABDOMINAL AORTAGRAM N/A 02/19/2013   Procedure: ABDOMINAL Maxcine Ham;  Surgeon: [name], MD;  Location: [facility] CATH LAB;  Service: Cardiovascular;  Laterality: N/A;  . CARDIAC CATHETERIZATION    . CARDIOVERSION N/A 06/16/2015   Procedure: CARDIOVERSION;  Surgeon: Satira Sark, MD;  Location: [facility] ORS;  Service: Cardiovascular;  Laterality: N/A;  . COLONOSCOPY  08/18/2012   Procedure: COLONOSCOPY;  Surgeon: [name], MD;  [facility] END[facility];  Service: Endoscopy;  Laterality: N/A;  1:25  . COLONOSCOPY N/A 09/12/2017   Procedure: COLONOSCOPY;  Surgeon: [name], MD;  [facility];  Service: Endoscopy;  Laterality: N/A;  . CORONARY ARTERY BYPASS GRAFT  2003   5 grafts - details not clear  . CRANIOTOMY  12/31/2011   Procedure: CRANIOTOMY HYPOPHYSECTOMY TRANSNASAL APPROACH;  Surgeon: [name], MD;  Location: [place] NEURO ORS;  Service: Neurosurgery;  Laterality: N/A;  Transphenoidal  Hypophysectomy With Fat Graft Harvest from right abdomen   . ESOPHAGOGASTRODUODENOSCOPY  08/18/2012   Procedure: ESOPHAGOGASTRODUODENOSCOPY (EGD);  Surgeon: [name], MD;  [facility];  Service: Endoscopy;  Laterality: N/A;  . ESOPHAGOGASTRODUODENOSCOPY N/A 03/31/2017   Procedure: ESOPHAGOGASTRODUODENOSCOPY (EGD);  Surgeon: [name], MD;  [facility];  Service: Endoscopy;  Laterality: N/A;  . ESOPHAGOGASTRODUODENOSCOPY N/A 09/12/2017   Procedure: ESOPHAGOGASTRODUODENOSCOPY (EGD);  Surgeon: [name], MD;  [facility];  Service: Endoscopy;  Laterality: N/A;  10:40  . EYE SURGERY  2012  . GIVENS CAPSULE STUDY N/A 01/25/2013   Procedure: GIVENS CAPSULE STUDY;  Surgeon: [name], MD;  [facility];  Service: Endoscopy;  Laterality: N/A;  730  . HOT HEMOSTASIS  03/31/2017   Procedure: HOT HEMOSTASIS (ARGON PLASMA COAGULATION/BICAP);  Surgeon: [name], MD;  [facility];  Service: Endoscopy;;  duodenum  . LARYNGOPLASTY Left 09/14/2016   Procedure: LARYNGOPLASTY;  Surgeon: [name], MD;  Location: [place];  Service: ENT;  Laterality: Left;  . LOWER EXTREMITY ANGIOGRAM N/A 02/19/2013   Procedure: LOWER EXTREMITY ANGIOGRAM;  Surgeon: [name], MD;  Location: [facility] CATH LAB;  Service: Cardiovascular;  Laterality: N/A;  . PERIPHERAL VASCULAR CATHETERIZATION N/A 01/19/2016   Procedure: Abdominal Aortogram;  Surgeon: Conrad Rhineland, MD;  Location: [place] CV LAB;  Service: Cardiovascular;  Laterality: N/A;  . PERIPHERAL VASCULAR CATHETERIZATION Bilateral 01/19/2016   Procedure: Lower  Extremity Angiography;  Surgeon: Conrad Dorchester, MD;  Location: [place] CV LAB;  Service: Cardiovascular;  Laterality: Bilateral;  . PV Angiogram  02/19/13   Indications: slow healing left calf ulcer  . Stress Myocardial Perfusion  12/08/2011   Indications: Abnormal EKG, Eval of prior GABG  . TEE WITHOUT CARDIOVERSION N/A  06/16/2015   Procedure: TRANSESOPHAGEAL ECHOCARDIOGRAM (TEE);  Surgeon: Satira Sark, MD;  Location[facility];  Service: Cardiovascular;  Laterality: N/A;  . THYROIDECTOMY N/A 09/14/2016   Procedure: TOTAL THYROIDECTOMY;  Surgeon: [name], MD;  Location: [place];  Service: ENT;  Laterality: N/A;  . THYROIDECTOMY    . TONSILLECTOMY  Age 82    There were no vitals filed for this visit.     [facility] John Sampson Assessment - 12/09/17 0001      Assessment   Medical Diagnosis  Wound on left hand    Referring Provider  Sinda Du     Onset Date/Surgical Date  11/30/17    Hand Dominance  Right    Prior Therapy  Yes previous wounds on LE and UE      Precautions   Precautions  None      Restrictions   Weight Bearing Restrictions  No      Balance Screen   Has the patient fallen in the past 6 months  No    Has the patient had a decrease in activity level because of a fear of falling?   No    Is the patient reluctant to leave their home because of a fear of falling?   No      Home Social worker  Private residence    Living Arrangements  Alone      Prior Function   Level of Independence  Independent;Independent with basic ADLs      Cognition   Overall Cognitive Status  Within Functional Limits for tasks assessed      Observation/Other Assessments   Observations  Skin discoleration in spots on BUE, bruising on left dorsal aspect of knuckles    Skin Integrity  thin              Wound Therapy - 12/09/17 1113    Subjective  John Sampson states that John Sampson is not having any pain from his hand     Patient and Family Stated Goals  wound to heal    Date of Onset  11/30/17    Prior Treatments  prior wound care for wound on BUE/BLE    Evaluation and Treatment Procedures Explained to Patient/Family  Yes    Evaluation and Treatment Procedures  agreed to    Wound Properties Date First Assessed: 12/01/17 Time First Assessed: 1305 Wound Type: Other (Comment) Location: Hand  Location Orientation: Left;Other (Comment) Wound Description (Comments): small superficial abrasion to dorsum of hand 2 inches distal to wrist Present on Admission: Yes Final Assessment Date: 12/09/17 Final Assessment Time: 1114   Wound Properties Date First Assessed: 12/01/17 Time First Assessed: 1305 Wound Type: Other (Comment) Location: Hand Location Orientation: Left;Other (Comment) Wound Description (Comments): abrasion to dorsum of left hand, proxmial to 5th MCP joint Present on Admission: Yes   Dressing Type  Impregnated gauze (bismuth);Gauze (Comment);Tape dressing    Dressing Status  Clean;Dry;Intact    Dressing Change Frequency  PRN 1x/week    Site / Wound Assessment  Clean;Dry;Bleeding    % Wound base Red or Granulating  20%    % Wound base Yellow/Fibrinous Exudate  80%    %  Wound base Black/Eschar  0%    % Wound base Other/Granulation Tissue (Comment)  0%    Peri-wound Assessment  Intact    Wound Length (cm)  1.3 cm was 2    Wound Width (cm)  0.4 cm was .8    Wound Depth (cm)  0.3 cm    Wound Volume (cm^3)  0.16 cm^3    Wound Surface Area (cm^2)  0.52 cm^2    Margins  Epibole (rolled edges)    Drainage Amount  Scant    Drainage Description  Serous    Treatment  Cleansed;Debridement (Selective)    Selective Debridement - Location  wound proximal to 5th MCP jt    Selective Debridement - Tools Used  Forceps    Selective Debridement - Tissue Removed  slough    Wound Therapy - Clinical Statement  John Sampson wound distal to wrist is now healed.  Wound proximal to 5th MCP jt  is now covered with slough and has increased depth and epiboled edges.  John Sampson lives alone and is on blood thinners therefore John Sampson will benefit from physical therapy to contnue with wound care to include debridement until wound is healed.      Wound Therapy - Functional Problem List  skin abrasion    Factors Delaying/Impairing Wound Healing  Vascular compromise;Multiple medical problems    Hydrotherapy Plan  Dressing  change;Patient/family education;Debridement    Wound Therapy - Frequency  Other (comment) 1x/week    Wound Therapy - Current Recommendations  John Sampson    Wound Plan  Will treat 2x/week for 2 weeks. Clean, debride, and dress wound. total of 4weeks     Dressing   xeroform, gauze medicpore tape    Patient/Family will be able to   dress and keep wound clean, dry, and intact independently    Patient/Family Instruction Goal - Progress  Met    Additional Wound Therapy Goal  wounds to heal    Additional Wound Therapy Goal - Progress  Progressing toward goal    Goals/treatment plan/discharge plan were made with and agreed upon by patient/family  Yes    Time For Goal Achievement  2 weeks requesting two more visits as John Sampson multiple medical problem    Wound Therapy - Potential for Goals  Excellent                John Sampson Short Term Goals - 12/06/17 1105      John Sampson SHORT TERM GOAL #1   Title  wounds to heal on dorsal aspect of left hand    Time  2    Period  Weeks    Status  On-going                 Patient will benefit from skilled therapeutic intervention in order to improve the following deficits and impairments:     Visit Diagnosis: Abrasion of hand and fingers, left, initial encounter     Problem List Patient Active Problem List   Diagnosis Date Noted  . Iron deficiency anemia due to chronic blood loss 08/24/2017  . GI bleeding 03/31/2017  . GI bleed 03/30/2017  . Thyroid cancer ([place]) 09/14/2016  . HCAP (healthcare-associated pneumonia) 08/24/2016  . Pneumonia 08/23/2016  . Chronic diastolic congestive heart failure ([place]) 07/24/2016  . Cellulitis of right arm 07/20/2016  . Cellulitis and abscess of hand 07/20/2016  . Venous stasis ulcers ([place]) 12/24/2015  . Chronic venous insufficiency 12/24/2015  . Atherosclerosis of extremity with ulceration ([place]) 12/24/2015  . CHF (congestive heart  failure) ([place]) 12/24/2015  . Critical lower limb ischemia 12/24/2015  . Obesity  12/24/2015  . PAF (paroxysmal atrial fibrillation) ([place]) 12/24/2015  . PAD (peripheral artery disease) ([place]) 06/07/2014  . COPD (chronic obstructive pulmonary disease) ([place]) 01/23/2013  . Anemia 08/03/2012  . Essential hypertension, benign 08/03/2012  . Mixed hyperlipidemia 08/03/2012  . Hx of CABG 08/03/2012    John Sampson, John Sampson CLT [phone] 12/09/2017, 11:21 AM  [facility] [address] [place], [place], [address] Phone: [phone]   Fax:  [phone]  Name: FATEH KINDLE MRN: [id number] Date of Birth: 06-Jan-1936

## 2017-12-13 ENCOUNTER — Encounter (HOSPITAL_COMMUNITY): Payer: Self-pay | Admitting: Physical Therapy

## 2017-12-13 ENCOUNTER — Ambulatory Visit (HOSPITAL_COMMUNITY): Payer: Medicare Other | Admitting: Physical Therapy

## 2017-12-13 DIAGNOSIS — S60419A Abrasion of unspecified finger, initial encounter: Secondary | ICD-10-CM | POA: Diagnosis not present

## 2017-12-13 DIAGNOSIS — M79642 Pain in left hand: Secondary | ICD-10-CM | POA: Diagnosis not present

## 2017-12-13 DIAGNOSIS — S60512A Abrasion of left hand, initial encounter: Principal | ICD-10-CM

## 2017-12-13 NOTE — Therapy (Addendum)
[place], [place], [address] Phone: [phone]   Fax:  [phone]  Wound Care Therapy/Discharge  Patient Details  Name: John Sampson MRN: [id number] Date of Birth: Oct 17, 1936 Referring Provider: Sinda Du    Encounter Date: 12/13/2017  PT End of Session - 12/13/17 1458    Visit Number  3    Number of Visits  3   Date for PT Re-Evaluation  12/15/17    Authorization Type  Medicare/Medicare Part A and B    Authorization Time Period  01/01/2018-01/15/2018    Authorization - Visit Number  3    Authorization - Number of Visits  3   PT Start Time  2876    PT Stop Time  1450    PT Time Calculation (min)  17 min    Activity Tolerance  Patient tolerated treatment well    Behavior During Therapy  [facility] for tasks assessed/performed       Past Medical History:  Diagnosis Date  . Atrial flutter ([place])   . AV malformation of GI tract   . Cancer ([place])    Thyroid  . COPD (chronic obstructive pulmonary disease) ([place]) 3.12.14   2D Echo, EF 50-55%  . Coronary atherosclerosis of native coronary artery    Multivessel status post CABG  . Gastric ulcer    Small - nonbleeding  . GERD (gastroesophageal reflux disease)   . Headache(784.0)   . Iron deficiency anemia    Negative Givens capsule study   . Myocardial infarction ([place])   . PAD (peripheral artery disease) ([facility])    Moderate bilateral SFA disease at angiography 01/2013  . Pituitary macroadenoma [facility])     Past Surgical History:  Procedure Laterality Date  . ABDOMINAL AORTAGRAM N/A 02/19/2013   Procedure: ABDOMINAL Maxcine Ham;  Surgeon: [name], MD;  Location: [facility] CATH LAB;  Service: Cardiovascular;  Laterality: N/A;  . CARDIAC CATHETERIZATION    . CARDIOVERSION N/A 06/16/2015   Procedure: CARDIOVERSION;  Surgeon: Satira Sark, MD;  Location: [facility] ORS;  Service: Cardiovascular;  Laterality: N/A;  . COLONOSCOPY  08/18/2012   Procedure: COLONOSCOPY;   Surgeon: [name], MD;  Location: [facility] ENDO SUITE;  Service: Endoscopy;  Laterality: N/A;  1:25  . COLONOSCOPY N/A 09/12/2017   Procedure: COLONOSCOPY;  Surgeon: [name], MD;  [facility] ENDO SU[facility];  Service: Endoscopy;  Laterality: N/A;  . CORONARY ARTERY BYPASS GRAFT  2003   5 grafts - details not clear  . CRANIOTOMY  12/31/2011   Procedure: CRANIOTOMY HYPOPHYSECTOMY TRANSNASAL APPROACH;  Surgeon: [name], MD;  Location: [place] NEURO ORS;  Service: Neurosurgery;  Laterality: N/A;  Transphenoidal Hypophysectomy With Fat Graft Harvest from right abdomen   . ESOPHAGOGASTRODUODENOSCOPY  08/18/2012   Procedure: ESOPHAGOGASTRODUODENOSCOPY (EGD);  Surgeon: [name], MD;  [facility];  Service: Endoscopy;  Laterality: N/A;  . ESOPHAGOGASTRODUODENOSCOPY N/A 03/31/2017   Procedure: ESOPHAGOGASTRODUODENOSCOPY (EGD);  Surgeon: [name], MD;  [facility];  Service: Endoscopy;  Laterality: N/A;  . ESOPHAGOGASTRODUODENOSCOPY N/A 09/12/2017   Procedure: ESOPHAGOGASTRODUODENOSCOPY (EGD);  Surgeon: [name], MD;  [facility];  Service: Endoscopy;  Laterality: N/A;  10:40  . EYE SURGERY  2012  . GIVENS CAPSULE STUDY N/A 01/25/2013   Procedure: GIVENS CAPSULE STUDY;  Surgeon: [name], MD;  [facility];  Service: Endoscopy;  Laterality: N/A;  730  . HOT HEMOSTASIS  03/31/2017  Procedure: HOT HEMOSTASIS (ARGON PLASMA COAGULATION/BICAP);  Surgeon: [name], MD;  [facility];  Service: Endoscopy;;  duodenum  . LARYNGOPLASTY Left 09/14/2016   Procedure: LARYNGOPLASTY;  Surgeon: [name], MD;  Location: [place];  Service: ENT;  Laterality: Left;  . LOWER EXTREMITY ANGIOGRAM N/A 02/19/2013   Procedure: LOWER EXTREMITY ANGIOGRAM;  Surgeon: [name], MD;  Location: [facility] CATH LAB;  Service: Cardiovascular;  Laterality: N/A;  . PERIPHERAL VASCULAR CATHETERIZATION N/A 01/19/2016   Procedure: Abdominal  Aortogram;  Surgeon: Conrad Scooba, MD;  Location: [place] CV LAB;  Service: Cardiovascular;  Laterality: N/A;  . PERIPHERAL VASCULAR CATHETERIZATION Bilateral 01/19/2016   Procedure: Lower Extremity Angiography;  Surgeon: Conrad Mooreton, MD;  Location: [place] CV LAB;  Service: Cardiovascular;  Laterality: Bilateral;  . PV Angiogram  02/19/13   Indications: slow healing left calf ulcer  . Stress Myocardial Perfusion  12/08/2011   Indications: Abnormal EKG, Eval of prior GABG  . TEE WITHOUT CARDIOVERSION N/A 06/16/2015   Procedure: TRANSESOPHAGEAL ECHOCARDIOGRAM (TEE);  Surgeon: Satira Sark, MD;  Location: [facility]S;  Service: Cardiovascular;  Laterality: N/A;  . THYROIDECTOMY N/A 09/14/2016   Procedure: TOTAL THYROIDECTOMY;  Surgeon: [name], MD;  Location: [place];  Service: ENT;  Laterality: N/A;  . THYROIDECTOMY    . TONSILLECTOMY  Age 18    There were no vitals filed for this visit.              Wound Therapy - 12/13/17 1436    Subjective  No pain    Patient and Family Stated Goals  wound to heal    Date of Onset  11/30/17    Prior Treatments  prior wound care for wound on BUE/BLE    Evaluation and Treatment Procedures Explained to Patient/Family  Yes    Evaluation and Treatment Procedures  agreed to    Wound Properties Date First Assessed: 12/01/17 Time First Assessed: 1305 Wound Type: Other (Comment) Location: Hand Location Orientation: Left;Other (Comment) Wound Description (Comments): abrasion to dorsum of left hand, proxmial to 5th MCP joint Present on Admission: Yes   Dressing Type  Impregnated gauze (bismuth);Gauze (Comment);Tape dressing    Dressing Status  Clean;Dry;Intact    Dressing Change Frequency  PRN 1x/week    Site / Wound Assessment  Clean;Dry;Bleeding    % Wound base Red or Granulating  90%    % Wound base Yellow/Fibrinous Exudate  10%    % Wound base Black/Eschar  0%    % Wound base Other/Granulation Tissue (Comment)  0%    Peri-wound  Assessment  Intact    Margins  Epibole (rolled edges)    Drainage Amount  None    Treatment  Cleansed;Debridement (Selective)    Selective Debridement - Location  wound proximal to 5th MCP jt    Selective Debridement - Tools Used  Forceps    Selective Debridement - Tissue Removed  dry skin    Wound Therapy - Clinical Statement  Pt wound distal to wrist is now healed.  Wound proximal to 5th MCP jt  is now covered with slough and has increased depth and epiboled edges.  Pt lives alone and is on blood thinners therefore he will benefit from physical therapy to contnue with wound care to include debridement until wound is healed.      Wound Therapy - Functional Problem List  skin abrasion    Factors Delaying/Impairing Wound Healing  Vascular compromise;Multiple medical problems  Hydrotherapy Plan  Debridement;Dressing change;Patient/family education    Wound Therapy - Frequency  Other (comment) 1x/week    Wound Therapy - Current Recommendations  PT    Wound Plan  Patient's wound is healing well. Therapists anticipates wound will be healed by next visit. Patient to visualize wound prior to next visit and cancel if wound is completely healed. total of 4weeks    Dressing   xeroform, gauze medicpore tape    Patient/Family will be able to   dress and keep wound clean, dry, and intact independently    Additional Wound Therapy Goal  wounds to heal    Goals/treatment plan/discharge plan were made with and agreed upon by patient/family  Yes    Time For Goal Achievement  2 weeks requesting two more visits as pt multiple medical problem    Wound Therapy - Potential for Goals  Excellent                PT Short Term Goals - 12/06/17 1105      PT SHORT TERM GOAL #1   Title  wounds to heal on dorsal aspect of left hand    Time  2    Period  Weeks    Status  met              Plan - 12/13/17 1459    Clinical Impression Statement  see above    Rehab Potential  Excellent    PT  Frequency  1x / week    PT Duration  2 weeks    PT Treatment/Interventions  Other (comment) Wound Care    PT Next Visit Plan  see above    Consulted and Agree with Plan of Care  Patient       Patient will benefit from skilled therapeutic intervention in order to improve the following deficits and impairments:  Decreased skin integrity  Visit Diagnosis: Abrasion of hand and fingers, left, initial encounter     Problem List Patient Active Problem List   Diagnosis Date Noted  . Iron deficiency anemia due to chronic blood loss 08/24/2017  . GI bleeding 03/31/2017  . GI bleed 03/30/2017  . Thyroid cancer ([place]) 09/14/2016  . HCAP (healthcare-associated pneumonia) 08/24/2016  . Pneumonia 08/23/2016  . Chronic diastolic congestive heart failure ([place]) 07/24/2016  . Cellulitis of right arm 07/20/2016  . Cellulitis and abscess of hand 07/20/2016  . Venous stasis ulcers ([place]) 12/24/2015  . Chronic venous insufficiency 12/24/2015  . Atherosclerosis of extremity with ulceration ([place]) 12/24/2015  . CHF (congestive heart failure) ([place]) 12/24/2015  . Critical lower limb ischemia 12/24/2015  . Obesity 12/24/2015  . PAF (paroxysmal atrial fibrillation) ([place]) 12/24/2015  . PAD (peripheral artery disease) ([place]) 06/07/2014  . COPD (chronic obstructive pulmonary disease) ([place]) 01/23/2013  . Anemia 08/03/2012  . Essential hypertension, benign 08/03/2012  . Mixed hyperlipidemia 08/03/2012  . Hx of CABG 08/03/2012    Rayetta Humphrey, PT CLT [phone] 12/13/2017, 2:59 PM  [place] [address] [place], [place], [address] Phone: [phone]   Fax:  [phone]  Name: John Sampson MRN: [id number] Date of Birth: [birth year] 12/15/2017  PHYSICAL THERAPY DISCHARGE SUMMARY  Visits from Start of Care: 3  Current functional level related to goals / functional outcomes: Wound fully healed   Remaining deficits: none   Education /  Equipment: Care for wound  Plan: Patient agrees to discharge.  Patient goals were met. Patient is being discharged due to meeting the  stated rehab goals.  ?????       Rayetta Humphrey, [place] CLT [phone]

## 2017-12-26 ENCOUNTER — Encounter: Payer: Self-pay | Admitting: Hematology

## 2017-12-26 DIAGNOSIS — E890 Postprocedural hypothyroidism: Secondary | ICD-10-CM | POA: Diagnosis not present

## 2017-12-26 DIAGNOSIS — R634 Abnormal weight loss: Secondary | ICD-10-CM | POA: Diagnosis not present

## 2017-12-26 DIAGNOSIS — I2510 Atherosclerotic heart disease of native coronary artery without angina pectoris: Secondary | ICD-10-CM | POA: Diagnosis not present

## 2017-12-26 DIAGNOSIS — I4891 Unspecified atrial fibrillation: Secondary | ICD-10-CM | POA: Diagnosis not present

## 2017-12-26 DIAGNOSIS — J449 Chronic obstructive pulmonary disease, unspecified: Secondary | ICD-10-CM | POA: Diagnosis not present

## 2018-01-02 DIAGNOSIS — E1140 Type 2 diabetes mellitus with diabetic neuropathy, unspecified: Secondary | ICD-10-CM | POA: Diagnosis not present

## 2018-01-02 DIAGNOSIS — E1151 Type 2 diabetes mellitus with diabetic peripheral angiopathy without gangrene: Secondary | ICD-10-CM | POA: Diagnosis not present

## 2018-01-04 ENCOUNTER — Encounter: Payer: Self-pay | Admitting: Hematology

## 2018-01-04 DIAGNOSIS — E890 Postprocedural hypothyroidism: Secondary | ICD-10-CM | POA: Diagnosis not present

## 2018-01-04 DIAGNOSIS — C73 Malignant neoplasm of thyroid gland: Secondary | ICD-10-CM | POA: Diagnosis not present

## 2018-01-10 DIAGNOSIS — I2510 Atherosclerotic heart disease of native coronary artery without angina pectoris: Secondary | ICD-10-CM | POA: Insufficient documentation

## 2018-01-10 NOTE — Progress Notes (Signed)
Cardiology Office Note    Date:  01/11/2018   ID:  John Sampson, DOB [birth year], MRN [id number]  PCP:  Sinda Du, MD  Cardiologist: Rozann Lesches, MD  Chief Complaint  Patient presents with  . Pre-op Exam    History of Present Illness:  John Sampson is a 82 y.o. male with history of CAD status post CABG in 2003 ischemic testing in 2013 was normal.  Last saw Dr. Domenic Polite 07/2017 no angina.  Also has HLD, carotid artery stenosis and paroxysmal atrial flutter on Cardizem and Eliquis.  Last 2D echo 2017 moderate LVH, LVEF 55% with probable hypokinesis of the basal inferior myocardium, restrictive diastolic filling pattern.  He has since had problems with anemia and hemoglobin of 8.5.  He does have history of gastric ulcers and duodenal AV malformation followed by Dr. Laural Golden.  Colonoscopy 08/2017 diverticulosis and hemorrhoids.  Endoscopy showed 2 nonbleeding ulcers that were healing. Last Hbg 9.4 12/06/16   He was in the ED 12/06/17 with generalized weakness question of UTI as well as pulmonary edema.  He was treated with antibiotics and told to take his Lasix for several days.We didn't see him.  Patient has history of thyroid cancer and needs vocal cord surgery for recurrence under general anesthesia for 30 min and needs clearance.  He denies any chest pain, palpitations, dizziness or presyncope.  He can only walk 10 or 15 feet because of his COPD.  He has chronic leg swelling and does not take Lasix regularly.  His blood pressure is high today.  He eats short sugars every day and gets extra sodium in his diet.  Past Medical History:  Diagnosis Date  . Atrial flutter ([place])   . AV malformation of GI tract   . Cancer ([place])    Thyroid  . COPD (chronic obstructive pulmonary disease) ([place]) 3.12.14   2D Echo, EF 50-55%  . Coronary atherosclerosis of native coronary artery    Multivessel status post CABG  . Gastric ulcer    Small - nonbleeding  . GERD (gastroesophageal reflux disease)    . Headache(784.0)   . Iron deficiency anemia    Negative Givens capsule study   . Myocardial infarction ([place])   . PAD (peripheral artery disease) (H[facility])    Moderate bilateral SFA disease at angiography 01/2013  . Pituitary macroadenoma [facility])     Past Surgical History:  Procedure Laterality Date  . ABDOMINAL AORTAGRAM N/A 02/19/2013   Procedure: ABDOMINAL Maxcine Ham;  Surgeon: [name], MD;  Location: [facility] CATH LAB;  Service: Cardiovascular;  Laterality: N/A;  . CARDIAC CATHETERIZATION    . CARDIOVERSION N/A 06/16/2015   Procedure: CARDIOVERSION;  Surgeon: Satira Sark, MD;  Location: [facility] ORS;  Service: Cardiovascular;  Laterality: N/A;  . COLONOSCOPY  08/18/2012   Procedure: COLONOSCOPY;  Surgeon: [name], MD;  [facility] ENDO SU[facility];  Service: Endoscopy;  Laterality: N/A;  1:25  . COLONOSCOPY N/A 09/12/2017   Procedure: COLONOSCOPY;  Surgeon: [name], MD;  [facility];  Service: Endoscopy;  Laterality: N/A;  . CORONARY ARTERY BYPASS GRAFT  2003   5 grafts - details not clear  . CRANIOTOMY  12/31/2011   Procedure: CRANIOTOMY HYPOPHYSECTOMY TRANSNASAL APPROACH;  Surgeon: [name], MD;  Location: [place] NEURO ORS;  Service: Neurosurgery;  Laterality: N/A;  Transphenoidal Hypophysectomy With Fat Graft Harvest from right abdomen   . ESOPHAGOGASTRODUODENOSCOPY  08/18/2012   Procedure: ESOPHAGOGASTRODUODENOSCOPY (EGD);  Surgeon: [name], MD;  [facility];  Service: Endoscopy;  Laterality: N/A;  . ESOPHAGOGASTRODUODENOSCOPY N/A 03/31/2017   Procedure: ESOPHAGOGASTRODUODENOSCOPY (EGD);  Surgeon: [name], MD;  [facility];  Service: Endoscopy;  Laterality: N/A;  . ESOPHAGOGASTRODUODENOSCOPY N/A 09/12/2017   Procedure: ESOPHAGOGASTRODUODENOSCOPY (EGD);  Surgeon: [name], MD;  [facility];  Service: Endoscopy;  Laterality: N/A;  10:40  . EYE SURGERY  2012  . GIVENS CAPSULE STUDY N/A 01/25/2013    Procedure: GIVENS CAPSULE STUDY;  Surgeon: [name], MD;  [facility];  Service: Endoscopy;  Laterality: N/A;  730  . HOT HEMOSTASIS  03/31/2017   Procedure: HOT HEMOSTASIS (ARGON PLASMA COAGULATION/BICAP);  Surgeon: [name], MD;  [facility];  Service: Endoscopy;;  duodenum  . LARYNGOPLASTY Left 09/14/2016   Procedure: LARYNGOPLASTY;  Surgeon: [name], MD;  Location: [facility];  Service: ENT;  Laterality: Left;  . LOWER EXTREMITY ANGIOGRAM N/A 02/19/2013   Procedure: LOWER EXTREMITY ANGIOGRAM;  Surgeon: [name], MD;  Location: [facility] CATH LAB;  Service: Cardiovascular;  Laterality: N/A;  . PERIPHERAL VASCULAR CATHETERIZATION N/A 01/19/2016   Procedure: Abdominal Aortogram;  Surgeon: Conrad Louisburg, MD;  Location: [place] CV LAB;  Service: Cardiovascular;  Laterality: N/A;  . PERIPHERAL VASCULAR CATHETERIZATION Bilateral 01/19/2016   Procedure: Lower Extremity Angiography;  Surgeon: Conrad Harper, MD;  Location: [place] CV LAB;  Service: Cardiovascular;  Laterality: Bilateral;  . PV Angiogram  02/19/13   Indications: slow healing left calf ulcer  . Stress Myocardial Perfusion  12/08/2011   Indications: Abnormal EKG, Eval of prior GABG  . TEE WITHOUT CARDIOVERSION N/A 06/16/2015   Procedure: TRANSESOPHAGEAL ECHOCARDIOGRAM (TEE);  Surgeon: Satira Sark, MD;  Location: [facility];  Service: Cardiovascular;  Laterality: N/A;  . THYROIDECTOMY N/A 09/14/2016   Procedure: TOTAL THYROIDECTOMY;  Surgeon: [name], MD;  Location: [place];  Service: ENT;  Laterality: N/A;  . THYROIDECTOMY    . TONSILLECTOMY  Age 44    Current Medications: Current Meds  Medication Sig  . albuterol (PROVENTIL HFA) 108 (90 BASE) MCG/ACT inhaler Inhale 2 puffs into the lungs every 6 (six) hours as needed for wheezing or shortness of breath.  [name] apixaban (ELIQUIS) 5 MG TABS tablet Take 1 tablet (5 mg total) by mouth 2 (two) times daily. Hold until Monday then  start 1 tablet twice a day as before  . diltiazem (CARDIZEM CD) 240 MG 24 hr capsule Take 1 capsule (240 mg total) by mouth daily.  . folic acid (FOLVITE) 1 MG tablet Take 1 mg by mouth daily.   [name] levothyroxine (SYNTHROID, LEVOTHROID) 125 MCG tablet Take 125 mcg by mouth daily before breakfast.   . metoprolol succinate (TOPROL-XL) 50 MG 24 hr tablet Take 50 mg by mouth 2 (two) times daily. Take with or immediately following a meal.  . nitroGLYCERIN (NITROSTAT) 0.4 MG SL tablet Place 1 tablet (0.4 mg total) under the tongue every 5 (five) minutes as needed for chest pain.  [name] omeprazole (PRILOSEC) 20 MG capsule Take 20 mg by mouth 2 (two) times daily before a meal.  . simvastatin (ZOCOR) 40 MG tablet Take 0.5 tablets (20 mg total) by mouth at bedtime.  . [DISCONTINUED] furosemide (LASIX) 20 MG tablet Take 1 tablet (20 mg total) by mouth daily. (Patient taking differently: Take 20 mg by mouth daily as needed for fluid. )  . [DISCONTINUED] lisinopril (PRINIVIL,ZESTRIL) 10 MG tablet Take 1 tablet (10  mg total) by mouth daily.  . [DISCONTINUED] potassium chloride SA (K-DUR,KLOR-CON) 20 MEQ tablet Take 20 mEq by mouth daily.     Allergies:   No known allergies   Social History   Socioeconomic History  . Marital status: Widowed    Spouse name: None  . Number of children: None  . Years of education: None  . Highest education level: None  Social Needs  . Financial resource strain: None  . Food insecurity - worry: None  . Food insecurity - inability: None  . Transportation needs - medical: None  . Transportation needs - non-medical: None  Occupational History  . None  Tobacco Use  . Smoking status: Former Smoker    Packs/day: 3.00    Years: 45.00    Pack years: 135.00    Types: Cigarettes    Start date: 01/24/1955    Last attempt to quit: 01/23/2002    Years since quitting: 15.9  . Smokeless tobacco: Never Used  . Tobacco comment: Quit 11 yrs ago  Substance and Sexual Activity  .  Alcohol use: No    Alcohol/week: 0.0 oz  . Drug use: No  . Sexual activity: No    Partners: Female  Other Topics Concern  . None  Social History Narrative  . None     Family History:  The patient's family history includes CAD in his mother; Heart disease in his father.   ROS:   Please see the history of present illness.    Review of Systems  Constitution: Positive for weakness.  HENT: Positive for hoarse voice.   Cardiovascular: Positive for dyspnea on exertion and leg swelling.  Respiratory: Negative.   Endocrine: Negative.   Hematologic/Lymphatic: Negative.   Musculoskeletal: Negative.   Gastrointestinal: Negative.   Genitourinary: Positive for frequency.   All other systems reviewed and are negative.   PHYSICAL EXAM:   VS:  BP (!) 160/80   Pulse 79   Ht _0  (1.753 m)   Wt 200 lb (90.7 kg)   SpO2 95%   BMI 29.53 kg/m   Physical Exam  GEN: Well nourished, well developed, in no acute distress  Neck: Mild increase JVD, bilateral carotid bruits, or masses Cardiac:RRR; 2/6 systolic murmur at the left sternal border Respiratory: Decreased breath sounds but clear to auscultation bilaterally, normal work of breathing GI: soft, nontender, nondistended, + BS Ext: +1-2 edema bilaterally up to his knees without cyanosis, clubbing,Good distal pulses bilaterally Neuro:  Alert and Oriented x 3 Psych: euthymic mood, full affect  Wt Readings from Last 3 Encounters:  01/11/18 200 lb (90.7 kg)  12/06/17 203 lb (92.1 kg)  12/06/17 203 lb (92.1 kg)      Studies/Labs Reviewed:   EKG:  EKG is  ordered today.  The ekg ordered today demonstrates normal sinus rhythm with LVH and repolarization changes  Recent Labs: 03/30/2017: TSH 0.013 04/27/2017: ALT 18 12/06/2017: B Natriuretic Peptide 305.0; BUN 17; Creatinine, Ser 0.97; Hemoglobin 9.4; Platelets 338; Potassium 3.7; Sodium 135   Lipid Panel    Component Value Date/Time   CHOL 184 08/14/2013 0818   TRIG 108 08/14/2013 0818     HDL 44 08/14/2013 0818   CHOLHDL 4.2 08/14/2013 0818   VLDL 22 08/14/2013 0818   LDLCALC 118 (H) 08/14/2013 0818    Additional studies/ records that were reviewed today include:  2D echo 2/2/17Study Conclusions   - Left ventricle: The cavity size was normal. Wall thickness was   increased in a pattern of  moderate LVH. The estimated ejection   fraction was 55%. Probable hypokinesis of the basalinferior   myocardium. Doppler parameters are consistent with restrictive   physiology, indicative of decreased left ventricular diastolic   compliance and/or increased left atrial pressure. - Aortic valve: Mildly calcified annulus. Trileaflet; mildly   calcified leaflets. - Mitral valve: Calcified annulus. There was mild regurgitation. - Left atrium: The atrium was severely dilated. - Right atrium: Central venous pressure (est): 3 mm Hg. - Tricuspid valve: There was mild regurgitation. - Pulmonary arteries: Systolic pressure was moderately increased.   PA peak pressure: 53 mm Hg (S). - Pericardium, extracardiac: There was no pericardial effusion.   Impressions:   - Moderate LVH with LVEF approximately 55% and basal inferior   hypokinesis. Restrictive diastolic filling pattern is noted with   evidence of increased LV filling pressure. severe left atrial   enlargement. MAC with mild mitral regurgitation. Sclerotic aortic   valve without stenosis. Mild tricuspid regurgitation with   evidence of moderate pulmonary hypertension, PASP 53 mmHg.   Carotid Dopplers 11/2018IMPRESSION: Less than 50% stenosis in the right internal carotid artery.   Less than 50% stenosis in the left internal carotid artery. There are elevated velocities within the internal carotid artery of unknown significance. The systolic internal to common carotid ratio is less than 2 and color Doppler images demonstrate wide patency.     Electronically Signed   By: Marybelle Killings M.D.   On: 10/06/2017 14:29  Carotid  Dopplers 10/06/17  IMPRESSION: Less than 50% stenosis in the right internal carotid artery.   Less than 50% stenosis in the left internal carotid artery. There are elevated velocities within the internal carotid artery of unknown significance. The systolic internal to common carotid ratio is less than 2 and color Doppler images demonstrate wide patency.      ASSESSMENT:    1. Preoperative clearance   2. Essential hypertension, benign   3. Acute on chronic diastolic congestive heart failure ([place])   4. PAF (paroxysmal atrial fibrillation) ([place])   5. Coronary artery disease involving coronary bypass graft of native heart without angina pectoris   6. Bilateral carotid bruits      PLAN:  In order of problems listed above:  Preoperative clearance before undergoing vocal cord surgery requiring 30 minutes of general anesthesia.  Patient has long history of CAD status post CABG in 2003 and has not been evaluated with ischemic testing since 2013.  He is also had recent trouble with heart failure and hypertension.  He denies any angina but his cardiac risk index is 6.6 which warrants further testing before clearing him for surgery.  Lexiscan Myoview to rule out ischemia prior to clearing him for surgery.  Follow-up with me in 1-2 weeks.  According to the Revised Cardiac Risk Index (RCRI), his Perioperative Risk of Major Cardiac Event is (%): 6.6  His Functional Capacity in METs is: 5.07 according to the [employer] Activity Status Index (DASI).   Essential hypertension blood pressure elevated today.  Increase lisinopril to 20 mg daily.  Will check be met when he comes back to the office.  2 g sodium diet.  PAF has been maintaining normal sinus rhythm.  On Eliquis and metoprolol  Acute on chronic diastolic CHF with recent ER visit 11/2016 with UTI and CHF.  Patient has fluid overload today with leg edema and hypertension.  Of asked him to take his Lasix 20 mg daily as well as his potassium.  2 g  sodium diet.  CAD status post prior CABG 2003 with negative Myoview in 2013-no angina but needs Lexiscan before clearing for surgery.  Carotid bruits Dopplers stable 09/2017    Medication Adjustments/Labs and Tests Ordered: Current medicines are reviewed at length with the patient today.  Concerns regarding medicines are outlined above.  Medication changes, Labs and Tests ordered today are listed in the Patient Instructions below. Patient Instructions  Medication Instructions:  Your physician recommends that you continue on your current medications as directed. Please refer to the Current Medication list given to you today. Take Lasix 20 mg Daily  Take Potassium 20 mEq Daily   Labwork: NONE   Testing/Procedures: Your physician has requested that you have a lexiscan myoview. For further information please visit [email or web address]. Please follow instruction sheet, as given.    Follow-Up: Your physician recommends that you schedule a follow-up appointment with Ermalinda Barrios, PA-C    Any Other Special Instructions Will Be Listed Below (If Applicable).     If you need a refill on your cardiac medications before your next appointment, please call your pharmacy.  Thank you for choosing [facility]!      Sumner Boast, PA-C  01/11/2018 1:04 PM    [facility] [address], [place], [place]  [address] Phone: [phone]; Fax: [phone]

## 2018-01-11 ENCOUNTER — Encounter: Payer: Self-pay | Admitting: *Deleted

## 2018-01-11 ENCOUNTER — Encounter: Payer: Self-pay | Admitting: Physician Assistant

## 2018-01-11 ENCOUNTER — Ambulatory Visit (INDEPENDENT_AMBULATORY_CARE_PROVIDER_SITE_OTHER): Payer: Medicare Other | Admitting: Physician Assistant

## 2018-01-11 VITALS — BP 160/80 | HR 79 | Ht 69.0 in | Wt 200.0 lb

## 2018-01-11 DIAGNOSIS — I10 Essential (primary) hypertension: Secondary | ICD-10-CM

## 2018-01-11 DIAGNOSIS — I5033 Acute on chronic diastolic (congestive) heart failure: Secondary | ICD-10-CM

## 2018-01-11 DIAGNOSIS — I480 Paroxysmal atrial fibrillation: Secondary | ICD-10-CM | POA: Diagnosis not present

## 2018-01-11 DIAGNOSIS — I25810 Atherosclerosis of coronary artery bypass graft(s) without angina pectoris: Secondary | ICD-10-CM

## 2018-01-11 DIAGNOSIS — Z01818 Encounter for other preprocedural examination: Secondary | ICD-10-CM | POA: Insufficient documentation

## 2018-01-11 DIAGNOSIS — R0989 Other specified symptoms and signs involving the circulatory and respiratory systems: Secondary | ICD-10-CM

## 2018-01-11 MED ORDER — LISINOPRIL 20 MG PO TABS: 20.0000 mg | ORAL_TABLET | Freq: Every day | ORAL | 3 refills | Status: DC

## 2018-01-11 MED ORDER — POTASSIUM CHLORIDE CRYS ER 20 MEQ PO TBCR: 20.0000 meq | EXTENDED_RELEASE_TABLET | Freq: Every day | ORAL | 3 refills | Status: DC

## 2018-01-11 MED ORDER — FUROSEMIDE 20 MG PO TABS: 20.0000 mg | ORAL_TABLET | Freq: Every day | ORAL | 3 refills | Status: DC

## 2018-01-11 NOTE — Patient Instructions (Addendum)
Medication Instructions:  Your physician recommends that you continue on your current medications as directed. Please refer to the Current Medication list given to you today. Take Lasix 20 mg Daily  Take Potassium 20 mEq Daily  Increase Lisinopril to 20 mg Daily  Labwork: NONE   Testing/Procedures: Your physician has requested that you have a lexiscan myoview. For further information please visit [email or web address]. Please follow instruction sheet, as given.    Follow-Up: Your physician recommends that you schedule a follow-up appointment with Ermalinda Barrios, PA-C    Any Other Special Instructions Will Be Listed Below (If Applicable).     If you need a refill on your cardiac medications before your next appointment, please call your pharmacy.  Thank you for choosing [facility]!

## 2018-01-13 ENCOUNTER — Encounter (HOSPITAL_BASED_OUTPATIENT_CLINIC_OR_DEPARTMENT_OTHER)
Admission: RE | Admit: 2018-01-13 | Discharge: 2018-01-13 | Disposition: A | Discharge status: Home / Self Care | Payer: Medicare Other | Source: Ambulatory Visit | Attending: Physician Assistant | Admitting: Physician Assistant

## 2018-01-13 ENCOUNTER — Ambulatory Visit (HOSPITAL_COMMUNITY)
Admission: RE | Admit: 2018-01-13 | Discharge: 2018-01-13 | Disposition: A | Discharge status: Home / Self Care | Payer: Medicare Other | Source: Ambulatory Visit | Attending: Pulmonary Disease | Admitting: Pulmonary Disease

## 2018-01-13 ENCOUNTER — Encounter (HOSPITAL_COMMUNITY): Payer: Self-pay

## 2018-01-13 DIAGNOSIS — Z01818 Encounter for other preprocedural examination: Secondary | ICD-10-CM | POA: Diagnosis not present

## 2018-01-13 DIAGNOSIS — I25810 Atherosclerosis of coronary artery bypass graft(s) without angina pectoris: Secondary | ICD-10-CM | POA: Insufficient documentation

## 2018-01-13 DIAGNOSIS — Z01810 Encounter for preprocedural cardiovascular examination: Secondary | ICD-10-CM | POA: Insufficient documentation

## 2018-01-13 LAB — NM MYOCAR MULTI W/SPECT W/WALL MOTION / EF
CHL CUP NUCLEAR SDS: 1
CHL CUP NUCLEAR SRS: 14
CHL CUP NUCLEAR SSS: 15
CHL CUP RESTING HR STRESS: 74 {beats}/min
CSEPPHR: 99 {beats}/min
LV dias vol: 144 mL (ref 62–150)
LV sys vol: 71 mL
RATE: 0.31
TID: 0.95

## 2018-01-13 MED ORDER — TECHNETIUM TC 99M TETROFOSMIN IV KIT
10.0000 | PACK | Freq: Once | INTRAVENOUS | Status: AC | PRN
  Administered 2018-01-13: 11 via INTRAVENOUS

## 2018-01-13 MED ORDER — TECHNETIUM TC 99M TETROFOSMIN IV KIT
30.0000 | PACK | Freq: Once | INTRAVENOUS | Status: AC | PRN
  Administered 2018-01-13: 31 via INTRAVENOUS

## 2018-01-13 MED ORDER — REGADENOSON 0.4 MG/5ML IV SOLN
INTRAVENOUS | Status: AC
  Administered 2018-01-13: 0.4 mg via INTRAVENOUS
  Filled 2018-01-13: qty 5

## 2018-01-13 MED ORDER — SODIUM CHLORIDE 0.9% FLUSH
INTRAVENOUS | Status: AC
  Administered 2018-01-13: 10 mL via INTRAVENOUS
  Filled 2018-01-13: qty 10

## 2018-01-24 NOTE — Progress Notes (Signed)
Cardiology Office Note    Date:  01/25/2018   ID:  [name], DOB 1936/06/22, MRN [id number]  PCP:  Sinda Du, MD  Cardiologist: Rozann Lesches, MD  Chief Complaint  Patient presents with  . Follow-up    History of Present Illness:  John Sampson is a 82 y.o. male with history of CAD status post CABG in 2003 ischemic testing in 2013 was normal.  Last saw Dr. Domenic Polite 07/2017 no angina.  Also has HLD, carotid artery stenosis and paroxysmal atrial flutter on Cardizem and Eliquis.  Last 2D echo 2017 moderate LVH, LVEF 55% with probable hypokinesis of the basal inferior myocardium, restrictive diastolic filling pattern.  Patient also has anemia with hemoglobins down to 8.5 with history of gastric ulcers and duodenal AVM malformations followed by Dr. Laural Golden.  Endoscopy 08/2017 showed 2 nonbleeding ulcers that were healing.  11/2017 he was in the ER with generalized weakness and question of UTI as well as pulmonary edema treated with antibiotics and told to take his Lasix for several days.  We did not see him.  I saw him 01/11/18 for surgical clearance for vocal cord surgery after thyroid cancer.  Blood pressure was elevated that day he has chronic dyspnea and can only walk 10-15 feet because of his COPD, chronic leg swelling and does not take Lasix regularly.  Eats high sodium diet.  Revised cardiac risk index was 6.6 so I ordered a [place] which was done on 01/13/18 with findings consistent with prior large inferior inferolateral MI no ischemia or ST changes LVEF 55-65%, low risk study.  Patient comes in today for follow-up.  Blood pressure is still high.  He says he is really stressed out because he cannot pay his [name] bills and he has been worried about it.  He is taking his Lasix every day and says he is watching his salt closely.  His edema has improved.  He denies any cardiac symptoms.   Past Medical History:  Diagnosis Date  . Atrial flutter ([place])   . AV  malformation of GI tract   . Cancer ([place])    Thyroid  . COPD (chronic obstructive pulmonary disease) ([place]) 3.12.14   2D Echo, EF 50-55%  . Coronary atherosclerosis of native coronary artery    Multivessel status post CABG  . Gastric ulcer    Small - nonbleeding  . GERD (gastroesophageal reflux disease)   . Headache(784.0)   . Iron deficiency anemia    Negative Givens capsule study   . Myocardial infarction ([place])   . PAD (peripheral artery disease) (HCC)    Moderate bilateral SFA disease at angiography 01/2013  . 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 ABDOMINAL AORTAGRAM N/A 02/19/2013   Procedure: ABDOMINAL Maxcine Ham;  Surgeon: [name], MD;  Location: [facility] CATH LAB;  Service: Cardiovascular;  Laterality: N/A;  . CARDIAC CATHETERIZATION    . CARDIOVERSION N/A 06/16/2015   Procedure: CARDIOVERSION;  Surgeon: Satira Sark, MD;  Location: [facility] ORS;  Service: Cardiovascular;  Laterality: N/A;  . COLONOSCOPY  08/18/2012   Procedure: COLONOSCOPY;  Surgeon: [name], MD;  [facility] ENDO [facility];  Service: Endoscopy;  Laterality: N/A;  1:25  . COLONOSCOPY N/A 09/12/2017   Procedure: COLONOSCOPY;  Surgeon: [name], MD;  [facility];  Service: Endoscopy;  Laterality: N/A;  . CORONARY ARTERY BYPASS GRAFT  2003   5 grafts - details not clear  .  CRANIOTOMY  12/31/2011   Procedure: CRANIOTOMY HYPOPHYSECTOMY TRANSNASAL APPROACH;  Surgeon: [name], MD;  Location: [place] NEURO ORS;  Service: Neurosurgery;  Laterality: N/A;  Transphenoidal Hypophysectomy With Fat Graft Harvest from right abdomen   . ESOPHAGOGASTRODUODENOSCOPY  08/18/2012   Procedure: ESOPHAGOGASTRODUODENOSCOPY (EGD);  Surgeon: [name], MD;  [facility];  Service: Endoscopy;  Laterality: N/A;  . ESOPHAGOGASTRODUODENOSCOPY N/A 03/31/2017   Procedure: ESOPHAGOGASTRODUODENOSCOPY (EGD);  Surgeon: [name], MD;  [facility];  Service: Endoscopy;  Laterality: N/A;  . ESOPHAGOGASTRODUODENOSCOPY N/A 09/12/2017   Procedure: ESOPHAGOGASTRODUODENOSCOPY (EGD);  Surgeon: [name], MD;  [facility];  Service: Endoscopy;  Laterality: N/A;  10:40  . EYE SURGERY  2012  . GIVENS CAPSULE STUDY N/A 01/25/2013   Procedure: GIVENS CAPSULE STUDY;  Surgeon: [name], MD;  [facility];  Service: Endoscopy;  Laterality: N/A;  730  . HOT HEMOSTASIS  03/31/2017   Procedure: HOT HEMOSTASIS (ARGON PLASMA COAGULATION/BICAP);  Surgeon: [name], MD;  [facility];  Service: Endoscopy;;  duodenum  . LARYNGOPLASTY Left 09/14/2016   Procedure: LARYNGOPLASTY;  Surgeon: [name], MD;  Location: [place];  Service: ENT;  Laterality: Left;  . LOWER EXTREMITY ANGIOGRAM N/A 02/19/2013   Procedure: LOWER EXTREMITY ANGIOGRAM;  Surgeon: [name], MD;  Location: [facility] CATH LAB;  Service: Cardiovascular;  Laterality: N/A;  . PERIPHERAL VASCULAR CATHETERIZATION N/A 01/19/2016   Procedure: Abdominal Aortogram;  Surgeon: Conrad [place]ville, MD;  Location: [place] CV LAB;  Service: Cardiovascular;  Laterality: N/A;  . PERIPHERAL VASCULAR CATHETERIZATION Bilateral 01/19/2016   Procedure: Lower Extremity Angiography;  Surgeon: Conrad Harlan, MD;  Location: [place] CV LAB;  Service: Cardiovascular;  Laterality: Bilateral;  . PV Angiogram  02/19/13   Indications: slow healing left calf ulcer  . Stress Myocardial Perfusion  12/08/2011   Indications: Abnormal EKG, Eval of prior GABG  . TEE WITHOUT CARDIOVERSION N/A 06/16/2015   Procedure: TRANSESOPHAGEAL ECHOCARDIOGRAM (TEE);  Surgeon: Satira Sark, MD;  Location: [facility] ORS;  Service: Cardiovascular;  Laterality: N/A;  . THYROIDECTOMY N/A 09/14/2016   Procedure: TOTAL THYROIDECTOMY;  Surgeon: [name], MD;  Location: [place];  Service: ENT;  Laterality: N/A;  . THYROIDECTOMY    . TONSILLECTOMY  Age 75    Current  Medications: Current Meds  Medication Sig  . albuterol (PROVENTIL HFA) 108 (90 BASE) MCG/ACT inhaler Inhale 2 puffs into the lungs every 6 (six) hours as needed for wheezing or shortness of breath.  [name] apixaban (ELIQUIS) 5 MG TABS tablet Take 1 tablet (5 mg total) by mouth 2 (two) times daily. Hold until Monday then start 1 tablet twice a day as before  . diltiazem (CARDIZEM CD) 240 MG 24 hr capsule Take 1 capsule (240 mg total) by mouth daily.  . folic acid (FOLVITE) 1 MG tablet Take 1 mg by mouth daily.   . furosemide (LASIX) 20 MG tablet Take 1 tablet (20 mg total) by mouth daily.  [name] levothyroxine (SYNTHROID, LEVOTHROID) 125 MCG tablet Take 125 mcg by mouth daily before breakfast.   . lisinopril (PRINIVIL,ZESTRIL) 20 MG tablet Take 1 tablet (20 mg total) by mouth daily.  . metoprolol succinate (TOPROL-XL) 50 MG 24 hr tablet Take 50 mg by mouth 2 (two) times daily. Take with or immediately following a meal.  . nitroGLYCERIN (NITROSTAT) 0.4 MG SL tablet Place 1 tablet (0.4 mg total) under the tongue every  5 (five) minutes as needed for chest pain.  [name] omeprazole (PRILOSEC) 20 MG capsule Take 20 mg by mouth 2 (two) times daily before a meal.  . potassium chloride SA (K-DUR,KLOR-CON) 20 MEQ tablet Take 1 tablet (20 mEq total) by mouth daily.  . simvastatin (ZOCOR) 40 MG tablet Take 0.5 tablets (20 mg total) by mouth at bedtime.     Allergies:   No known allergies   Social History   Socioeconomic History  . Marital status: Widowed    Spouse name: None  . Number of children: None  . Years of education: None  . Highest education level: None  Social Needs  . Financial resource strain: None  . Food insecurity - worry: None  . Food insecurity - inability: None  . Transportation needs - medical: None  . Transportation needs - non-medical: None  Occupational History  . None  Tobacco Use  . Smoking status: Former Smoker    Packs/day: 3.00    Years: 45.00    Pack years: 135.00    Types:  Cigarettes    Start date: 01/24/1955    Last attempt to quit: 01/23/2002    Years since quitting: 16.0  . Smokeless tobacco: Never Used  . Tobacco comment: Quit 11 yrs ago  Substance and Sexual Activity  . Alcohol use: No    Alcohol/week: 0.0 oz  . Drug use: No  . Sexual activity: No    Partners: Female  Other Topics Concern  . None  Social History Narrative  . None     Family History:  The patient's family history includes CAD in his mother; Heart disease in his father.   ROS:   Please see the history of present illness.    Review of Systems  Constitution: Positive for weakness.  HENT: Positive for hoarse voice.   Cardiovascular: Negative.   Respiratory: Negative.   Endocrine: Negative.   Hematologic/Lymphatic: Negative.   Musculoskeletal: Positive for arthritis.  Gastrointestinal: Negative.   Genitourinary: Negative.    All other systems reviewed and are negative.   PHYSICAL EXAM:   VS:  BP (!) 180/92   Pulse 66   Ht _0  (1.753 m)   Wt 199 lb (90.3 kg)   SpO2 97%   BMI 29.39 kg/m   Physical Exam  GEN: Well nourished, well developed, in no acute distress  Neck: no JVD, carotid bruits, or masses Cardiac:RRR; 2/6 systolic murmur the left sternal border Respiratory:  clear to auscultation bilaterally, normal work of breathing GI: soft, nontender, nondistended, + BS Ext: without cyanosis, clubbing, or edema, Good distal pulses bilaterally Neuro:  Alert and Oriented x 3 Psych: euthymic mood, full affect  Wt Readings from Last 3 Encounters:  01/25/18 199 lb (90.3 kg)  01/11/18 200 lb (90.7 kg)  12/06/17 203 lb (92.1 kg)      Studies/Labs Reviewed:   EKG:  EKG is not ordered today.  Recent Labs: 03/30/2017: TSH 0.013 04/27/2017: ALT 18 12/06/2017: B Natriuretic Peptide 305.0; BUN 17; Creatinine, Ser 0.97; Hemoglobin 9.4; Platelets 338; Potassium 3.7; Sodium 135   Lipid Panel    Component Value Date/Time   CHOL 184 08/14/2013 0818   TRIG 108 08/14/2013 0818    HDL 44 08/14/2013 0818   CHOLHDL 4.2 08/14/2013 0818   VLDL 22 08/14/2013 0818   LDLCALC 118 (H) 08/14/2013 0818    Additional studies/ records that were reviewed today include:  Lexiscan Myoview 2/15/19There was no ST segment deviation noted during stress. Findings consistent with  prior large inferior/inferolateral myocardial infarction. This is a low risk study. There is no current myocardium at jeopardy. The left ventricular ejection fraction is normal (55-65%). 2D echo 2/2/17Study Conclusions   - Left ventricle: The cavity size was normal. Wall thickness was   increased in a pattern of moderate LVH. The estimated ejection   fraction was 55%. Probable hypokinesis of the basalinferior   myocardium. Doppler parameters are consistent with restrictive   physiology, indicative of decreased left ventricular diastolic   compliance and/or increased left atrial pressure. - Aortic valve: Mildly calcified annulus. Trileaflet; mildly   calcified leaflets. - Mitral valve: Calcified annulus. There was mild regurgitation. - Left atrium: The atrium was severely dilated. - Right atrium: Central venous pressure (est): 3 mm Hg. - Tricuspid valve: There was mild regurgitation. - Pulmonary arteries: Systolic pressure was moderately increased.   PA peak pressure: 53 mm Hg (S). - Pericardium, extracardiac: There was no pericardial effusion.   Impressions:   - Moderate LVH with LVEF approximately 55% and basal inferior   hypokinesis. Restrictive diastolic filling pattern is noted with   evidence of increased LV filling pressure. severe left atrial   enlargement. MAC with mild mitral regurgitation. Sclerotic aortic   valve without stenosis. Mild tricuspid regurgitation with   evidence of moderate pulmonary hypertension, PASP 53 mmHg.   Carotid Dopplers 11/2018IMPRESSION: Less than 50% stenosis in the right internal carotid artery.   Less than 50% stenosis in the left internal carotid artery.  There are elevated velocities within the internal carotid artery of unknown significance. The systolic internal to common carotid ratio is less than 2 and color Doppler images demonstrate wide patency.     Electronically Signed   By: Marybelle Killings M.D.   On: 10/06/2017 14:29   Carotid Dopplers 10/06/17   IMPRESSION: Less than 50% stenosis in the right internal carotid artery.   Less than 50% stenosis in the left internal carotid artery. There are elevated velocities within the internal carotid artery of unknown significance. The systolic internal to common carotid ratio is less than 2 and color Doppler images demonstrate wide patency.         ASSESSMENT:    1. Coronary artery disease involving coronary bypass graft of native heart without angina pectoris   2. Chronic diastolic congestive heart failure ([place])   3. PAF (paroxysmal atrial fibrillation) ([place])   4. Essential hypertension, benign   5. Bilateral carotid bruits      PLAN:  In order of problems listed above:  CAD status post CABG in 2003 with recent [place] 01/13/18 for preoperative clearance showed no ischemia low risk study normal LVEF 55-65%.  Has been cleared to have vocal cord surgery by Dr. Radene Journey.  Chronic diastolic CHF with edema last office visit.  I  asked him to take his Lasix daily and 2 g sodium diet.  His edema has resolved.  We will check renal function today.  PAF on Eliquis and metoprolol  Essential hypertension lisinopril increased to 20 mg daily last office visit 2 g sodium diet.  BP still quite high.  He says he cannot afford any more medications.  Will increase lisinopril to 40 mg daily.  Reiterate 2 g sodium diet.  He does not want to come back for follow-up blood pressure check because he cannot afford it.  He takes his blood pressure at home and I have asked him to call us in 2 weeks with blood pressure readings.  Carotid bruits Dopplers were  stable 09/2017    Medication  Adjustments/Labs and Tests Ordered: Current medicines are reviewed at length with the patient today.  Concerns regarding medicines are outlined above.  Medication changes, Labs and Tests ordered today are listed in the Patient Instructions below. There are no Patient Instructions on file for this visit.   Sumner Boast, PA-C  01/25/2018 12:41 PM    [facility] [address], [place], [place]  [address] Phone: [phone]; Fax: [phone]

## 2018-01-25 ENCOUNTER — Ambulatory Visit (INDEPENDENT_AMBULATORY_CARE_PROVIDER_SITE_OTHER): Payer: Medicare Other | Admitting: Physician Assistant

## 2018-01-25 ENCOUNTER — Other Ambulatory Visit (HOSPITAL_COMMUNITY)
Admission: RE | Admit: 2018-01-25 | Discharge: 2018-01-25 | Disposition: A | Discharge status: Home / Self Care | Payer: Medicare Other | Source: Ambulatory Visit | Attending: Physician Assistant | Admitting: Physician Assistant

## 2018-01-25 ENCOUNTER — Encounter: Payer: Self-pay | Admitting: Physician Assistant

## 2018-01-25 VITALS — BP 180/92 | HR 66 | Ht 69.0 in | Wt 199.0 lb

## 2018-01-25 DIAGNOSIS — R0989 Other specified symptoms and signs involving the circulatory and respiratory systems: Secondary | ICD-10-CM

## 2018-01-25 DIAGNOSIS — I480 Paroxysmal atrial fibrillation: Secondary | ICD-10-CM

## 2018-01-25 DIAGNOSIS — I25810 Atherosclerosis of coronary artery bypass graft(s) without angina pectoris: Secondary | ICD-10-CM | POA: Diagnosis not present

## 2018-01-25 DIAGNOSIS — I5032 Chronic diastolic (congestive) heart failure: Secondary | ICD-10-CM | POA: Insufficient documentation

## 2018-01-25 DIAGNOSIS — I10 Essential (primary) hypertension: Secondary | ICD-10-CM

## 2018-01-25 LAB — BASIC METABOLIC PANEL
ANION GAP: 9 (ref 5–15)
BUN: 14 mg/dL (ref 6–20)
CALCIUM: 9.6 mg/dL (ref 8.9–10.3)
CO2: 26 mmol/L (ref 22–32)
Chloride: 104 mmol/L (ref 101–111)
Creatinine, Ser: 0.82 mg/dL (ref 0.61–1.24)
Glucose, Bld: 94 mg/dL (ref 65–99)
POTASSIUM: 4.2 mmol/L (ref 3.5–5.1)
Sodium: 139 mmol/L (ref 135–145)

## 2018-01-25 MED ORDER — LISINOPRIL 40 MG PO TABS: 40.0000 mg | ORAL_TABLET | Freq: Every day | ORAL | 3 refills | Status: DC

## 2018-01-25 NOTE — Patient Instructions (Signed)
Medication Instructions:  Your physician has recommended you make the following change in your medication: Increase Lisinopril to 40 mg Daily    Labwork: Your physician recommends that you return for lab work today.    Testing/Procedures: NONE   Follow-Up: Your physician recommends that you schedule a follow-up appointment in: 3-4 months    Any Other Special Instructions Will Be Listed Below (If Applicable).  Call office in 2 weeks and update [place] on your Blood pressure.    If you need a refill on your cardiac medications before your next appointment, please call your pharmacy.  Thank you for choosing [facility]!

## 2018-01-26 DIAGNOSIS — R490 Dysphonia: Secondary | ICD-10-CM | POA: Diagnosis not present

## 2018-02-07 ENCOUNTER — Encounter (HOSPITAL_BASED_OUTPATIENT_CLINIC_OR_DEPARTMENT_OTHER): Payer: Self-pay | Admitting: *Deleted

## 2018-02-07 ENCOUNTER — Other Ambulatory Visit: Payer: Self-pay

## 2018-02-08 ENCOUNTER — Ambulatory Visit: Payer: Self-pay | Admitting: Otolaryngology

## 2018-02-08 NOTE — H&P (Signed)
PREOPERATIVE H&P  Chief Complaint: chronic hoarseness  HPI: John Sampson is a 82 y.o. male who presents for evaluation of chronic hoarseness. Patient has had a previous history of thyroid cancer status post thyroidectomy. 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 He had a medialization procedure performed at the time of thyroidectomy in 2017. He's had gradual worsening of his voice. On exam in the office he has increased bowing of the left true vocal cord with atrophy of the vocalis muscle. He's taken operating room at this time for injection into the left vocal cord to help improve his voice. He has not had aspiration problems. He does have cardiac history as well as on Eliquis and has been cleared by his cardiologist. He will stop Eliquis 2 days in advance.  Past Medical History:  Diagnosis Date  . Atrial flutter ([facility])   . AV malformation of GI tract   . Cancer ([facility])    Thyroid  . COPD (chronic obstructive pulmonary disease) ([facility]) 3.12.14   2D Echo, EF 50-55%  . Coronary atherosclerosis of native coronary artery    Multivessel status post CABG  . Gastric ulcer    Small - nonbleeding  . GERD (gastroesophageal reflux disease)   . Headache(784.0)   . Hypothyroidism   . Iron deficiency anemia    Negative Givens capsule study   . Myocardial infarction ([facility])   . PAD (peripheral artery disease) (HCC)    Moderate bilateral SFA disease at angiography 01/2013  . Pituitary macroadenoma [facility])    Past Surgical History:  Procedure Laterality Date  . ABDOMINAL AORTAGRAM N/A 02/19/2013   Procedure: ABDOMINAL Maxcine Ham;  Surgeon: [name], MD;  Location: [facility] CATH [facility];  Service: Cardiovascular;  Laterality: N/A;  . CARDIAC CATHETERIZATION    . CARDIOVERSION N/A 06/16/2015   Procedure: CARDIOVERSION;  Surgeon: Satira Sark, MD;  Location: [facility];  Service: Cardiovascular;  Laterality: N/A;  . COLONOSCOPY   08/18/2012   Procedure: COLONOSCOPY;  Surgeon: [facility] Houston, MD;  [facility];  Service: Endoscopy;  Laterality: N/A;  1:25  . COLONOSCOPY N/A 09/12/2017   Procedure: COLONOSCOPY;  Surgeon: [facility] Houston, MD;  [facility]: [facility];  Service: Endoscopy;  Laterality: N/A;  . CORONARY ARTERY BYPASS GRAFT  2003   5 grafts - details not clear  . CRANIOTOMY  12/31/2011   Procedure: CRANIOTOMY HYPOPHYSECTOMY TRANSNASAL APPROACH;  Surgeon: John Hoops, MD;  Location: [facility] NEURO [facility];  Service: Neurosurgery;  Laterality: N/A;  Transphenoidal Hypophysectomy With Fat Graft Harvest from right abdomen   . ESOPHAGOGASTRODUODENOSCOPY  08/18/2012   Procedure: ESOPHAGOGASTRODUODENOSCOPY (EGD);  Surgeon: [facility] Houston, MD;  [facility]: [facility];  Service: Endoscopy;  Laterality: N/A;  . ESOPHAGOGASTRODUODENOSCOPY N/A 03/31/2017   Procedure: ESOPHAGOGASTRODUODENOSCOPY (EGD);  Surgeon: [facility] Houston, MD;  [facility];  Service: Endoscopy;  Laterality: N/A;  . ESOPHAGOGASTRODUODENOSCOPY N/A 09/12/2017   Procedure: ESOPHAGOGASTRODUODENOSCOPY (EGD);  Surgeon: [facility] Houston, MD;  [facility]: [facility];  Service: Endoscopy;  Laterality: N/A;  10:40  . EYE SURGERY  2012  . GIVENS CAPSULE STUDY N/A 01/25/2013   Procedure: GIVENS CAPSULE STUDY;  Surgeon: [facility] Houston, MD;  [facility]: [facility];  Service: Endoscopy;  Laterality: N/A;  730  . HOT HEMOSTASIS  03/31/2017   Procedure: HOT HEMOSTASIS (ARGON PLASMA COAGULATION/BICAP);  Surgeon: [facility] Houston, MD;  [facility]: [facility];  Service: Endoscopy;;  duodenum  . LARYNGOPLASTY Left 09/14/2016   Procedure: LARYNGOPLASTY;  Surgeon: John Nunnery, MD;  Location: [facility];  Service: ENT;  Laterality: Left;  . LOWER EXTREMITY ANGIOGRAM N/A 02/19/2013   Procedure: LOWER EXTREMITY ANGIOGRAM;  Surgeon: [name], MD;  Location: [facility] CATH [facility];  Service: Cardiovascular;  Laterality: N/A;  . PERIPHERAL VASCULAR CATHETERIZATION  N/A 01/19/2016   Procedure: Abdominal Aortogram;  Surgeon: [name], MD;  Location: [facility];  Service: Cardiovascular;  Laterality: N/A;  . PERIPHERAL VASCULAR CATHETERIZATION Bilateral 01/19/2016   Procedure: Lower Extremity Angiography;  Surgeon: Conrad [place], MD;  Location: [facility];  Service: Cardiovascular;  Laterality: Bilateral;  . PV Angiogram  02/19/13   Indications: slow healing left calf ulcer  . Stress Myocardial Perfusion  12/08/2011   Indications: Abnormal EKG, Eval of prior GABG  . TEE WITHOUT CARDIOVERSION N/A 06/16/2015   Procedure: TRANSESOPHAGEAL ECHOCARDIOGRAM (TEE);  Surgeon: Satira Sark, MD;  Location: [facility];  Service: Cardiovascular;  Laterality: N/A;  . THYROIDECTOMY N/A 09/14/2016   Procedure: TOTAL THYROIDECTOMY;  Surgeon: John Nunnery, MD;  Location: [place];  Service: ENT;  Laterality: N/A;  . THYROIDECTOMY    . TONSILLECTOMY  Age 77   Social History   Socioeconomic History  . Marital status: Widowed    Spouse name: None  . Number of children: None  . Years of education: None  . Highest education level: None  Social Needs  . Financial resource strain: None  . Food insecurity - worry: None  . Food insecurity - inability: None  . Transportation needs - medical: None  . Transportation needs - non-medical: None  Occupational History  . None  Tobacco Use  . Smoking status: Former Smoker    Packs/day: 3.00    Years: 45.00    Pack years: 135.00    Types: Cigarettes    Start date: 01/24/1955    Last attempt to quit: 01/23/2002    Years since quitting: 16.0  . Smokeless tobacco: Never Used  . Tobacco comment: Quit 11 yrs ago  Substance and Sexual Activity  . Alcohol use: No    Alcohol/week: 0.0 oz  . Drug use: No  . Sexual activity: No    Partners: Female  Other Topics Concern  . None  Social History Narrative  . None   Family History  Problem Relation Age of Onset  . CAD Mother   . Heart disease Father         before age 40   Allergies  Allergen Reactions  . No Known Allergies    Prior to Admission medications   Medication Sig Start Date End Date Taking? Authorizing Provider  albuterol (PROVENTIL HFA) 108 (90 BASE) MCG/ACT inhaler Inhale 2 puffs into the lungs every 6 (six) hours as needed for wheezing or shortness of breath. 11/15/14  Yes Satira Sark, MD  apixaban (ELIQUIS) 5 MG TABS tablet Take 1 tablet (5 mg total) by mouth 2 (two) times daily. Hold until Monday then start 1 tablet twice a day as before 09/15/17  Yes Rehman, Mechele Dawley, MD  diltiazem (CARDIZEM CD) 240 MG 24 hr capsule Take 1 capsule (240 mg total) by mouth daily. 05/30/15  Yes Lendon Colonel, NP  folic acid (FOLVITE) 1 MG tablet Take 1 mg by mouth daily.    Yes [provider]  furosemide (LASIX) 20 MG tablet Take 1 tablet (20 mg total) by mouth daily. 01/11/18 04/11/18 Yes Lenze,  Jennye Moccasin, PA-C  levothyroxine (SYNTHROID, LEVOTHROID) 125 MCG tablet Take 125 mcg by mouth daily before breakfast.    Yes [provider]  lisinopril (PRINIVIL,ZESTRIL) 40 MG tablet Take 1 tablet (40 mg total) by mouth daily. 01/25/18 04/25/18 Yes Imogene Burn, PA-C  metoprolol succinate (TOPROL-XL) 50 MG 24 hr tablet Take 50 mg by mouth 2 (two) times daily. Take with or immediately following a meal.   Yes [provider]  mirtazapine (REMERON) 15 MG tablet Take 15 mg by mouth at bedtime.   Yes [provider]  omeprazole (PRILOSEC) 20 MG capsule Take 20 mg by mouth 2 (two) times daily before a meal.   Yes [provider]  potassium chloride SA (K-DUR,KLOR-CON) 20 MEQ tablet Take 1 tablet (20 mEq total) by mouth daily. 01/11/18  Yes Imogene Burn, PA-C  simvastatin (ZOCOR) 40 MG tablet Take 0.5 tablets (20 mg total) by mouth at bedtime. 07/25/16  Yes Sinda Du, MD  nitroGLYCERIN (NITROSTAT) 0.4 MG SL tablet Place 1 tablet (0.4 mg total) under the tongue every 5 (five) minutes as needed for chest  pain. 06/07/14   Satira Sark, MD     Positive ROS: negative  All other systems have been reviewed and were otherwise negative with the exception of those mentioned in the HPI and as above.  Physical Exam: There were no vitals filed for this visit.  General: Alert, no acute distress Oral: Normal oral mucosa and tonsils Nasal: Clear nasal passages    FOL revealed paralyzed left TVC with atrophy and bowing. Neck: No palpable adenopathy or thyroid nodules Ear: Ear canal is clear with normal appearing TMs Cardiovascular: Regular rate and rhythm, no murmur.  Respiratory: Clear to auscultation Neurologic: Alert and oriented x 3   Assessment/Plan: dysphonia Plan for Procedure(s): MICROLARYNGOSCOPY WITH VOCAL CORD INJECTION   Melony Overly, MD 02/08/2018 3:46 PM

## 2018-02-10 ENCOUNTER — Ambulatory Visit (HOSPITAL_BASED_OUTPATIENT_CLINIC_OR_DEPARTMENT_OTHER)
Admission: RE | Admit: 2018-02-10 | Discharge: 2018-02-10 | Disposition: A | Discharge status: Home / Self Care | Payer: Medicare Other | Source: Ambulatory Visit | Attending: Otolaryngology | Admitting: Otolaryngology

## 2018-02-10 ENCOUNTER — Other Ambulatory Visit: Payer: Self-pay

## 2018-02-10 ENCOUNTER — Ambulatory Visit (HOSPITAL_BASED_OUTPATIENT_CLINIC_OR_DEPARTMENT_OTHER): Payer: Medicare Other | Admitting: Anesthesiology

## 2018-02-10 ENCOUNTER — Encounter (HOSPITAL_BASED_OUTPATIENT_CLINIC_OR_DEPARTMENT_OTHER)
Admission: RE | Disposition: A | Discharge status: Home / Self Care | Payer: Self-pay | Source: Ambulatory Visit | Attending: Otolaryngology

## 2018-02-10 ENCOUNTER — Encounter (HOSPITAL_BASED_OUTPATIENT_CLINIC_OR_DEPARTMENT_OTHER): Payer: Self-pay | Admitting: Anesthesiology

## 2018-02-10 DIAGNOSIS — R490 Dysphonia: Secondary | ICD-10-CM | POA: Insufficient documentation

## 2018-02-10 DIAGNOSIS — I5032 Chronic diastolic (congestive) heart failure: Secondary | ICD-10-CM | POA: Diagnosis not present

## 2018-02-10 DIAGNOSIS — K219 Gastro-esophageal reflux disease without esophagitis: Secondary | ICD-10-CM | POA: Diagnosis not present

## 2018-02-10 DIAGNOSIS — Z79899 Other long term (current) drug therapy: Secondary | ICD-10-CM | POA: Insufficient documentation

## 2018-02-10 DIAGNOSIS — Z79890 Hormone replacement therapy (postmenopausal): Secondary | ICD-10-CM | POA: Insufficient documentation

## 2018-02-10 DIAGNOSIS — J449 Chronic obstructive pulmonary disease, unspecified: Secondary | ICD-10-CM | POA: Diagnosis not present

## 2018-02-10 DIAGNOSIS — I110 Hypertensive heart disease with heart failure: Secondary | ICD-10-CM | POA: Insufficient documentation

## 2018-02-10 DIAGNOSIS — E890 Postprocedural hypothyroidism: Secondary | ICD-10-CM | POA: Insufficient documentation

## 2018-02-10 DIAGNOSIS — I509 Heart failure, unspecified: Secondary | ICD-10-CM | POA: Diagnosis not present

## 2018-02-10 DIAGNOSIS — I4892 Unspecified atrial flutter: Secondary | ICD-10-CM | POA: Diagnosis not present

## 2018-02-10 DIAGNOSIS — I252 Old myocardial infarction: Secondary | ICD-10-CM | POA: Insufficient documentation

## 2018-02-10 DIAGNOSIS — J3801 Paralysis of vocal cords and larynx, unilateral: Secondary | ICD-10-CM | POA: Diagnosis not present

## 2018-02-10 DIAGNOSIS — Z87891 Personal history of nicotine dependence: Secondary | ICD-10-CM | POA: Diagnosis not present

## 2018-02-10 DIAGNOSIS — Z951 Presence of aortocoronary bypass graft: Secondary | ICD-10-CM | POA: Diagnosis not present

## 2018-02-10 DIAGNOSIS — J3800 Paralysis of vocal cords and larynx, unspecified: Secondary | ICD-10-CM | POA: Diagnosis not present

## 2018-02-10 DIAGNOSIS — Z7901 Long term (current) use of anticoagulants: Secondary | ICD-10-CM | POA: Diagnosis not present

## 2018-02-10 DIAGNOSIS — I2510 Atherosclerotic heart disease of native coronary artery without angina pectoris: Secondary | ICD-10-CM | POA: Diagnosis not present

## 2018-02-10 DIAGNOSIS — Z85850 Personal history of malignant neoplasm of thyroid: Secondary | ICD-10-CM | POA: Diagnosis not present

## 2018-02-10 HISTORY — PX: MICROLARYNGOSCOPY: SHX5208

## 2018-02-10 HISTORY — DX: Hypothyroidism, unspecified: E03.9

## 2018-02-10 SURGERY — MICROLARYNGOSCOPY
Anesthesia: General | Site: Mouth

## 2018-02-10 MED ORDER — SUCCINYLCHOLINE CHLORIDE 200 MG/10ML IV SOSY
PREFILLED_SYRINGE | INTRAVENOUS | Status: AC
  Filled 2018-02-10: qty 10

## 2018-02-10 MED ORDER — FENTANYL CITRATE (PF) 100 MCG/2ML IJ SOLN: 50.0000 ug | INTRAMUSCULAR | Status: DC | PRN

## 2018-02-10 MED ORDER — ONDANSETRON HCL 4 MG/2ML IJ SOLN
INTRAMUSCULAR | Status: AC
  Filled 2018-02-10: qty 2

## 2018-02-10 MED ORDER — DEXAMETHASONE SODIUM PHOSPHATE 10 MG/ML IJ SOLN
INTRAMUSCULAR | Status: AC
  Filled 2018-02-10: qty 1

## 2018-02-10 MED ORDER — ONDANSETRON HCL 4 MG/2ML IJ SOLN
INTRAMUSCULAR | Status: DC | PRN
  Administered 2018-02-10: 4 mg via INTRAVENOUS

## 2018-02-10 MED ORDER — EPINEPHRINE 30 MG/30ML IJ SOLN
INTRAMUSCULAR | Status: AC
  Filled 2018-02-10: qty 1

## 2018-02-10 MED ORDER — FENTANYL CITRATE (PF) 100 MCG/2ML IJ SOLN
INTRAMUSCULAR | Status: AC
  Filled 2018-02-10: qty 2

## 2018-02-10 MED ORDER — SUCCINYLCHOLINE CHLORIDE 20 MG/ML IJ SOLN
INTRAMUSCULAR | Status: DC | PRN
  Administered 2018-02-10 (×2): 50 mg via INTRAVENOUS

## 2018-02-10 MED ORDER — GLYCOPYRROLATE 0.2 MG/ML IJ SOLN
INTRAMUSCULAR | Status: DC | PRN
  Administered 2018-02-10: 0.1 mg via INTRAVENOUS

## 2018-02-10 MED ORDER — PROPOFOL 10 MG/ML IV BOLUS
INTRAVENOUS | Status: AC
  Filled 2018-02-10: qty 20

## 2018-02-10 MED ORDER — CEFAZOLIN SODIUM-DEXTROSE 2-4 GM/100ML-% IV SOLN
INTRAVENOUS | Status: AC
  Filled 2018-02-10: qty 100

## 2018-02-10 MED ORDER — METOPROLOL TARTRATE 5 MG/5ML IV SOLN
INTRAVENOUS | Status: AC
  Filled 2018-02-10: qty 5

## 2018-02-10 MED ORDER — CEFAZOLIN SODIUM-DEXTROSE 2-4 GM/100ML-% IV SOLN
2.0000 g | INTRAVENOUS | Status: AC
  Administered 2018-02-10: 2 g via INTRAVENOUS

## 2018-02-10 MED ORDER — FENTANYL CITRATE (PF) 100 MCG/2ML IJ SOLN: 25.0000 ug | INTRAMUSCULAR | Status: DC | PRN

## 2018-02-10 MED ORDER — SCOPOLAMINE 1 MG/3DAYS TD PT72: 1.0000 | MEDICATED_PATCH | Freq: Once | TRANSDERMAL | Status: DC | PRN

## 2018-02-10 MED ORDER — PROPOFOL 10 MG/ML IV BOLUS
INTRAVENOUS | Status: DC | PRN
  Administered 2018-02-10: 50 mg via INTRAVENOUS
  Administered 2018-02-10: 150 mg via INTRAVENOUS

## 2018-02-10 MED ORDER — METOPROLOL TARTRATE 5 MG/5ML IV SOLN
INTRAVENOUS | Status: DC | PRN
  Administered 2018-02-10: 5 mg via INTRAVENOUS

## 2018-02-10 MED ORDER — CHLORHEXIDINE GLUCONATE CLOTH 2 % EX PADS: 6.0000 | MEDICATED_PAD | Freq: Once | CUTANEOUS | Status: DC

## 2018-02-10 MED ORDER — NALOXONE HCL 0.4 MG/ML IJ SOLN
INTRAMUSCULAR | Status: AC
Start: 2018-02-10 — End: 2018-02-10
  Filled 2018-02-10: qty 1

## 2018-02-10 MED ORDER — PHENYLEPHRINE 40 MCG/ML (10ML) SYRINGE FOR IV PUSH (FOR BLOOD PRESSURE SUPPORT)
PREFILLED_SYRINGE | INTRAVENOUS | Status: AC
  Filled 2018-02-10: qty 20

## 2018-02-10 MED ORDER — DEXAMETHASONE SODIUM PHOSPHATE 4 MG/ML IJ SOLN
INTRAMUSCULAR | Status: DC | PRN
  Administered 2018-02-10: 10 mg via INTRAVENOUS

## 2018-02-10 MED ORDER — MIDAZOLAM HCL 2 MG/2ML IJ SOLN: 1.0000 mg | INTRAMUSCULAR | Status: DC | PRN

## 2018-02-10 MED ORDER — ONDANSETRON HCL 4 MG/2ML IJ SOLN: 4.0000 mg | Freq: Once | INTRAMUSCULAR | Status: DC | PRN

## 2018-02-10 MED ORDER — FENTANYL CITRATE (PF) 100 MCG/2ML IJ SOLN
INTRAMUSCULAR | Status: DC | PRN
  Administered 2018-02-10 (×3): 25 ug via INTRAVENOUS
  Administered 2018-02-10: 50 ug via INTRAVENOUS

## 2018-02-10 MED ORDER — LIDOCAINE HCL (CARDIAC) 20 MG/ML IV SOLN
INTRAVENOUS | Status: DC | PRN
  Administered 2018-02-10: 30 mg via INTRAVENOUS

## 2018-02-10 MED ORDER — PHENYLEPHRINE 40 MCG/ML (10ML) SYRINGE FOR IV PUSH (FOR BLOOD PRESSURE SUPPORT)
PREFILLED_SYRINGE | INTRAVENOUS | Status: AC
  Filled 2018-02-10: qty 10

## 2018-02-10 MED ORDER — LIDOCAINE HCL (CARDIAC) 20 MG/ML IV SOLN
INTRAVENOUS | Status: AC
  Filled 2018-02-10: qty 5

## 2018-02-10 MED ORDER — PHENYLEPHRINE HCL 10 MG/ML IJ SOLN
INTRAMUSCULAR | Status: DC | PRN
  Administered 2018-02-10: 80 ug via INTRAVENOUS

## 2018-02-10 MED ORDER — LACTATED RINGERS IV SOLN
INTRAVENOUS | Status: DC
  Administered 2018-02-10 (×2): via INTRAVENOUS

## 2018-02-10 SURGICAL SUPPLY — 29 items
CANISTER SUCT 1200ML W/VALVE (MISCELLANEOUS) ×3 IMPLANT
CONT SPEC 4OZ CLIKSEAL STRL BL (MISCELLANEOUS) IMPLANT
GAUZE SPONGE 4X4 12PLY STRL LF (GAUZE/BANDAGES/DRESSINGS) ×6 IMPLANT
GLOVE BIO SURGEON STRL SZ 6.5 (GLOVE) ×2 IMPLANT
GLOVE BIO SURGEONS STRL SZ 6.5 (GLOVE) ×1
GLOVE SS BIOGEL STRL SZ 7.5 (GLOVE) ×1 IMPLANT
GLOVE SUPERSENSE BIOGEL SZ 7.5 (GLOVE) ×2
GOWN STRL REUS W/ TWL LRG LVL3 (GOWN DISPOSABLE) IMPLANT
GOWN STRL REUS W/ TWL XL LVL3 (GOWN DISPOSABLE) IMPLANT
GOWN STRL REUS W/TWL LRG LVL3 (GOWN DISPOSABLE)
GOWN STRL REUS W/TWL XL LVL3 (GOWN DISPOSABLE)
GUARD TEETH (MISCELLANEOUS) IMPLANT
KIT PROLARN PLUS GEL W/NDL (Miscellaneous) ×3 IMPLANT
MARKER SKIN DUAL TIP RULER LAB (MISCELLANEOUS) IMPLANT
NDL SAFETY ECLIPSE 18X1.5 (NEEDLE) ×1 IMPLANT
NEEDLE HYPO 18GX1.5 SHARP (NEEDLE) ×2
NEEDLE SPNL 22GX7 QUINCKE BK (NEEDLE) IMPLANT
NS IRRIG 1000ML POUR BTL (IV SOLUTION) ×3 IMPLANT
PACK BASIN DAY SURGERY FS (CUSTOM PROCEDURE TRAY) ×3 IMPLANT
PAD ALCOHOL SWAB (MISCELLANEOUS) ×3 IMPLANT
PATTIES SURGICAL .5 X3 (DISPOSABLE) ×3 IMPLANT
SHEET MEDIUM DRAPE 40X70 STRL (DRAPES) ×3 IMPLANT
SLEEVE SCD COMPRESS KNEE MED (MISCELLANEOUS) ×3 IMPLANT
SOLUTION ANTI FOG 6CC (MISCELLANEOUS) IMPLANT
SOLUTION BUTLER CLEAR DIP (MISCELLANEOUS) ×3 IMPLANT
SYR CONTROL 10ML LL (SYRINGE) IMPLANT
TOWEL OR 17X24 6PK STRL BLUE (TOWEL DISPOSABLE) ×3 IMPLANT
TUBE CONNECTING 20'X1/4 (TUBING) ×1
TUBE CONNECTING 20X1/4 (TUBING) ×2 IMPLANT

## 2018-02-10 NOTE — Anesthesia Postprocedure Evaluation (Signed)
Anesthesia Post Note  Patient: John Sampson  Procedure(s) Performed: MICROLARYNGOSCOPY WITH VOCAL CORD INJECTION (N/A Mouth)     Patient location during evaluation: PACU Anesthesia Type: General Level of consciousness: awake and alert, oriented, patient uncooperative and awake Pain management: pain level controlled Vital Signs Assessment: post-procedure vital signs reviewed and stable Respiratory status: spontaneous breathing, nonlabored ventilation, respiratory function stable and patient connected to nasal cannula oxygen Cardiovascular status: blood pressure returned to baseline and stable Postop Assessment: no apparent nausea or vomiting Anesthetic complications: no    Last Vitals:  Vitals:   02/10/18 1030 02/10/18 1100  BP:  (!) 157/53  Pulse:  82  Resp: 18 16  Temp:    SpO2: 92% 92%    Last Pain:  Vitals:   02/10/18 1100  TempSrc:   PainSc: 0-No pain                 Catalina Gravel

## 2018-02-10 NOTE — Brief Op Note (Signed)
02/10/2018  9:06 AM  PATIENT:  John Sampson  82 y.o. male  PRE-OPERATIVE DIAGNOSIS:  Dysphonia with left VC paralysis  POST-OPERATIVE DIAGNOSIS:  Dysphonia with left VC paralysis  PROCEDURE:  Procedure(s): MICROLARYNGOSCOPY WITH VOCAL CORD INJECTION (N/A) left  SURGEON:  Surgeon(s) and Role:    [name], MD - Primary  PHYSICIAN ASSISTANT:   ASSISTANTS: none   ANESTHESIA:   general  EBL:  5 mL   BLOOD ADMINISTERED:none  DRAINS: none   LOCAL MEDICATIONS USED:  NONE  SPECIMEN:  No Specimen  DISPOSITION OF SPECIMEN:  N/A  COUNTS:  YES  TOURNIQUET:  * No tourniquets in log *  DICTATION: .Other Dictation: Dictation Number [id number]  PLAN OF CARE: Discharge to home after PACU  PATIENT DISPOSITION:  PACU - hemodynamically stable.   Delay start of Pharmacological VTE agent (>24hrs) due to surgical blood loss or risk of bleeding: yes

## 2018-02-10 NOTE — Transfer of Care (Signed)
Immediate Anesthesia Transfer of Care Note  Patient: John Sampson  Procedure(s) Performed: MICROLARYNGOSCOPY WITH VOCAL CORD INJECTION (N/A Mouth)  Patient Location: PACU  Anesthesia Type:General  Level of Consciousness: awake and patient cooperative  Airway & Oxygen Therapy: Patient Spontanous Breathing and Patient connected to face mask oxygen  Post-op Assessment: Report given to RN and Post -op Vital signs reviewed and stable  Post vital signs: Reviewed and stable  Last Vitals:  Vitals:   02/10/18 0651  BP: (!) 198/69  Pulse: 74  Resp: 18  Temp: 36.4 C  SpO2: 98%    Last Pain:  Vitals:   02/10/18 0651  TempSrc: Oral         Complications: No apparent anesthesia complications

## 2018-02-10 NOTE — Op Note (Signed)
[name], [name] NO.:  [id number]  MEDICAL RECORD NO.:  [id number]  LOCATION:                                 FACILITY:  PHYSICIAN:  Leonides Sake. Lucia Gaskins, M.D.DATE OF BIRTH:  1936-05-27  DATE OF PROCEDURE:  02/10/2018 DATE OF DISCHARGE:  02/10/2018                              OPERATIVE REPORT   PREOPERATIVE DIAGNOSIS:  Hoarseness with left vocal cord paralysis.  POSTOPERATIVE DIAGNOSIS:  Hoarseness with left vocal cord paralysis.  OPERATION PERFORMED:  Microlaryngoscopy with injection of left true vocal cord with Prolaryn Plus 0.4 to 0.45 mL.  SURGEON:  Leonides Sake. Lucia Gaskins, M.D.  ANESTHESIA:  General endotracheal.  COMPLICATIONS:  None.  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  He is status post a total thyroidectomy and a laryngoplasty procedure on the left side about 2 years ago.  He has had a little bit of increasing weakness of his voice, and on exam, has atrophy of the left true vocal cord with no mobility of the left true vocal cord.  He is taken to the operating room at this time for injection of the left true vocal cord with Prolaryn Plus.  DESCRIPTION OF PROCEDURE:  The patient underwent general endotracheal anesthesia with a 5.5 endotracheal tube.  Direct laryngoscopy was performed.  Base of tongue, epiglottis, vocal cords were normal to evaluation.  The Dedo laryngoscope was suspended with good visualization of both true vocal cords.  The patient has atrophy and bowing of the midportion of left true vocal cord.  Three injections were performed on the left true vocal cord deep to the vocalis muscle.  The first 2 injections were more posteriorly and mid 0.15 mL of Prolaryn, and the last injection was a little bit more anteriorly of 0.1 mL of Prolaryn. Photos were obtained and it showed good medialization of  the left true vocal cord.  A total of 0.4 to 0.45 mL of the Prolaryn was injected with a small amount extruding from the injection site.  There was no significant hematoma or swelling and minimal bleeding.  This completed the procedure.  The patient was extubated and transferred to recovery room postoperatively doing well.  DISPOSITION:  The patient is discharged home later this morning.  He will restart his Eliquis tomorrow.  We will have him follow up in my office in 1 week for recheck.  No new medications prescribed.          ______________________________ Leonides Sake Lucia Gaskins, M.D.     CEN/MEDQ  D:  02/10/2018  T:  02/10/2018  Job:  [id number]

## 2018-02-10 NOTE — Anesthesia Preprocedure Evaluation (Addendum)
Anesthesia Evaluation  Patient identified by MRN, date of birth, ID band Patient awake    Reviewed: Allergy & Precautions, NPO status , Patient's Chart, lab work & pertinent test results, reviewed documented beta blocker date and time   Airway Mallampati: II  TM Distance: >3 FB Neck ROM: Full    Dental  (+) Edentulous Lower, Edentulous Upper   Pulmonary COPD,  COPD inhaler, former smoker,    Pulmonary exam normal breath sounds clear to auscultation       Cardiovascular hypertension, Pt. on medications and Pt. on home beta blockers + CAD, + Past MI, + CABG, + Peripheral Vascular Disease and +CHF  Normal cardiovascular exam+ dysrhythmias Atrial Fibrillation  Rhythm:Regular Rate:Normal  Echo 01/01/16: Impressions:  - Moderate LVH with LVEF approximately 55% and basal inferior hypokinesis. Restrictive diastolic filling pattern is noted with evidence of increased LV filling pressure. severe left atrial enlargement. MAC with mild mitral regurgitation. Sclerotic aortic valve without stenosis. Mild tricuspid regurgitation with evidence of moderate pulmonary hypertension, PASP 53 mmHg.   Neuro/Psych  Headaches, Pituitary macroadenoma negative psych ROS   GI/Hepatic Neg liver ROS, PUD, GERD  Medicated,  Endo/Other  Hypothyroidism   Renal/GU negative Renal ROS     Musculoskeletal negative musculoskeletal ROS (+)   Abdominal   Peds  Hematology  (+) Blood dyscrasia (Eliquis), ,   Anesthesia Other Findings Day of surgery medications reviewed with the patient.  Reproductive/Obstetrics                            Anesthesia Physical Anesthesia Plan  ASA: III  Anesthesia Plan: General   Post-op Pain Management:    Induction: Intravenous  PONV Risk Score and Plan: 3 and Treatment may vary due to age or medical condition, Dexamethasone and Ondansetron  Airway Management Planned: Oral ETT  Additional  Equipment:   Intra-op Plan:   Post-operative Plan: Extubation in OR  Informed Consent: I have reviewed the patients History and Physical, chart, labs and discussed the procedure including the risks, benefits and alternatives for the proposed anesthesia with the patient or authorized representative who has indicated his/her understanding and acceptance.   Dental advisory given  Plan Discussed with: CRNA  Anesthesia Plan Comments:         Anesthesia Quick Evaluation

## 2018-02-10 NOTE — Anesthesia Procedure Notes (Signed)
Procedure Name: Intubation Date/Time: 02/10/2018 8:14 AM Performed by: Marrianne Mood, CRNA Pre-anesthesia Checklist: Patient identified, Emergency Drugs available, Suction available, Patient being monitored and Timeout performed Patient Re-evaluated:Patient Re-evaluated prior to induction Oxygen Delivery Method: Circle system utilized Preoxygenation: Pre-oxygenation with 100% oxygen Induction Type: IV induction Ventilation: Mask ventilation without difficulty Laryngoscope Size: Miller and 2 Grade View: Grade II Tube type: Oral Tube size: 5.5 mm Number of attempts: 1 Airway Equipment and Method: Stylet and Oral airway Placement Confirmation: ETT inserted through vocal cords under direct vision,  positive ETCO2 and breath sounds checked- equal and bilateral Secured at: 23 cm Tube secured with: Tape Dental Injury: Teeth and Oropharynx as per pre-operative assessment

## 2018-02-10 NOTE — Discharge Instructions (Addendum)
Tylenol prn pain or discomfort Restart your Eliquis tomorrow Call office if any problems or questions. Call for follow up appt in 1 week.    [phone]   Post Anesthesia Home Care Instructions  Activity: Get plenty of rest for the remainder of the day. A responsible individual must stay with you for 24 hours following the procedure.  For the next 24 hours, DO NOT: -Drive a car -Paediatric nurse -Drink alcoholic beverages -Take any medication unless instructed by your physician -Make any legal decisions or sign important papers.  Meals: Start with liquid foods such as gelatin or soup. Progress to regular foods as tolerated. Avoid greasy, spicy, heavy foods. If nausea and/or vomiting occur, drink only clear liquids until the nausea and/or vomiting subsides. Call your physician if vomiting continues.  Special Instructions/Symptoms: Your throat may feel dry or sore from the anesthesia or the breathing tube placed in your throat during surgery. If this causes discomfort, gargle with warm salt water. The discomfort should disappear within 24 hours.  If you had a scopolamine patch placed behind your ear for the management of post- operative nausea and/or vomiting:  1. The medication in the patch is effective for 72 hours, after which it should be removed.  Wrap patch in a tissue and discard in the trash. Wash hands thoroughly with soap and water. 2. You may remove the patch earlier than 72 hours if you experience unpleasant side effects which may include dry mouth, dizziness or visual disturbances. 3. Avoid touching the patch. Wash your hands with soap and water after contact with the patch.

## 2018-02-10 NOTE — Interval H&P Note (Signed)
History and Physical Interval Note:  02/10/2018 8:01 AM  John Sampson  has presented today for surgery, with the diagnosis of dysphonia  The various methods of treatment have been discussed with the patient and family. After consideration of risks, benefits and other options for treatment, the patient has consented to  Procedure(s): MICROLARYNGOSCOPY WITH VOCAL CORD INJECTION (N/A) as a surgical intervention .  The patient's history has been reviewed, patient examined, no change in status, stable for surgery.  I have reviewed the patient's chart and labs.  Questions were answered to the patient's satisfaction.     Melony Overly

## 2018-02-13 ENCOUNTER — Encounter (HOSPITAL_BASED_OUTPATIENT_CLINIC_OR_DEPARTMENT_OTHER): Payer: Self-pay | Admitting: Otolaryngology

## 2018-02-14 ENCOUNTER — Other Ambulatory Visit: Payer: Self-pay

## 2018-02-14 ENCOUNTER — Inpatient Hospital Stay (HOSPITAL_COMMUNITY)
Admission: AD | Admit: 2018-02-14 | Discharge: 2018-03-02 | DRG: 643 | Disposition: A | Discharge status: Skilled Nursing Facility | Payer: Medicare Other | Source: Ambulatory Visit | Attending: Internal Medicine | Admitting: Internal Medicine

## 2018-02-14 ENCOUNTER — Encounter (HOSPITAL_COMMUNITY): Payer: Self-pay | Admitting: Internal Medicine

## 2018-02-14 DIAGNOSIS — E782 Mixed hyperlipidemia: Secondary | ICD-10-CM

## 2018-02-14 DIAGNOSIS — E039 Hypothyroidism, unspecified: Secondary | ICD-10-CM | POA: Diagnosis not present

## 2018-02-14 DIAGNOSIS — E43 Unspecified severe protein-calorie malnutrition: Secondary | ICD-10-CM

## 2018-02-14 DIAGNOSIS — Z87891 Personal history of nicotine dependence: Secondary | ICD-10-CM

## 2018-02-14 DIAGNOSIS — I10 Essential (primary) hypertension: Secondary | ICD-10-CM | POA: Diagnosis not present

## 2018-02-14 DIAGNOSIS — R0600 Dyspnea, unspecified: Secondary | ICD-10-CM

## 2018-02-14 DIAGNOSIS — I739 Peripheral vascular disease, unspecified: Secondary | ICD-10-CM | POA: Diagnosis present

## 2018-02-14 DIAGNOSIS — R1310 Dysphagia, unspecified: Secondary | ICD-10-CM | POA: Diagnosis not present

## 2018-02-14 DIAGNOSIS — S80812A Abrasion, left lower leg, initial encounter: Secondary | ICD-10-CM | POA: Diagnosis not present

## 2018-02-14 DIAGNOSIS — I5033 Acute on chronic diastolic (congestive) heart failure: Secondary | ICD-10-CM | POA: Diagnosis present

## 2018-02-14 DIAGNOSIS — D72829 Elevated white blood cell count, unspecified: Secondary | ICD-10-CM

## 2018-02-14 DIAGNOSIS — R1314 Dysphagia, pharyngoesophageal phase: Secondary | ICD-10-CM | POA: Diagnosis present

## 2018-02-14 DIAGNOSIS — E860 Dehydration: Secondary | ICD-10-CM | POA: Diagnosis present

## 2018-02-14 DIAGNOSIS — Z7901 Long term (current) use of anticoagulants: Secondary | ICD-10-CM

## 2018-02-14 DIAGNOSIS — K219 Gastro-esophageal reflux disease without esophagitis: Secondary | ICD-10-CM | POA: Diagnosis present

## 2018-02-14 DIAGNOSIS — Z951 Presence of aortocoronary bypass graft: Secondary | ICD-10-CM

## 2018-02-14 DIAGNOSIS — I110 Hypertensive heart disease with heart failure: Secondary | ICD-10-CM | POA: Diagnosis present

## 2018-02-14 DIAGNOSIS — I2510 Atherosclerotic heart disease of native coronary artery without angina pectoris: Secondary | ICD-10-CM | POA: Diagnosis present

## 2018-02-14 DIAGNOSIS — Z66 Do not resuscitate: Secondary | ICD-10-CM | POA: Diagnosis present

## 2018-02-14 DIAGNOSIS — C73 Malignant neoplasm of thyroid gland: Principal | ICD-10-CM | POA: Diagnosis present

## 2018-02-14 DIAGNOSIS — J449 Chronic obstructive pulmonary disease, unspecified: Secondary | ICD-10-CM | POA: Diagnosis present

## 2018-02-14 DIAGNOSIS — I480 Paroxysmal atrial fibrillation: Secondary | ICD-10-CM | POA: Diagnosis not present

## 2018-02-14 DIAGNOSIS — E890 Postprocedural hypothyroidism: Secondary | ICD-10-CM | POA: Diagnosis present

## 2018-02-14 DIAGNOSIS — C7802 Secondary malignant neoplasm of left lung: Secondary | ICD-10-CM | POA: Diagnosis present

## 2018-02-14 DIAGNOSIS — Z4659 Encounter for fitting and adjustment of other gastrointestinal appliance and device: Secondary | ICD-10-CM

## 2018-02-14 DIAGNOSIS — Z79890 Hormone replacement therapy (postmenopausal): Secondary | ICD-10-CM

## 2018-02-14 DIAGNOSIS — Z8249 Family history of ischemic heart disease and other diseases of the circulatory system: Secondary | ICD-10-CM

## 2018-02-14 DIAGNOSIS — I252 Old myocardial infarction: Secondary | ICD-10-CM

## 2018-02-14 DIAGNOSIS — J3800 Paralysis of vocal cords and larynx, unspecified: Secondary | ICD-10-CM | POA: Diagnosis present

## 2018-02-14 DIAGNOSIS — Z79899 Other long term (current) drug therapy: Secondary | ICD-10-CM

## 2018-02-14 DIAGNOSIS — C7801 Secondary malignant neoplasm of right lung: Secondary | ICD-10-CM | POA: Diagnosis present

## 2018-02-14 HISTORY — DX: Paralysis of vocal cords and larynx, unspecified: J38.00

## 2018-02-14 HISTORY — DX: Malignant neoplasm of thyroid gland: C73

## 2018-02-14 LAB — COMPREHENSIVE METABOLIC PANEL
ALBUMIN: 3.6 g/dL (ref 3.5–5.0)
ALT: 22 U/L (ref 17–63)
ANION GAP: 11 (ref 5–15)
AST: 30 U/L (ref 15–41)
Alkaline Phosphatase: 62 U/L (ref 38–126)
BUN: 25 mg/dL — ABNORMAL HIGH (ref 6–20)
CALCIUM: 9.3 mg/dL (ref 8.9–10.3)
CO2: 24 mmol/L (ref 22–32)
Chloride: 105 mmol/L (ref 101–111)
Creatinine, Ser: 0.98 mg/dL (ref 0.61–1.24)
GFR calc non Af Amer: >60 mL/min (ref >60)
Glucose, Bld: 100 mg/dL — ABNORMAL HIGH (ref 65–99)
POTASSIUM: 3.4 mmol/L — AB (ref 3.5–5.1)
SODIUM: 140 mmol/L (ref 135–145)
Total Bilirubin: 1.3 mg/dL — ABNORMAL HIGH (ref 0.3–1.2)
Total Protein: 6.7 g/dL (ref 6.5–8.1)

## 2018-02-14 LAB — CBC WITH DIFFERENTIAL/PLATELET
BASOS PCT: 0 %
Basophils Absolute: 0 10*3/uL (ref 0.0–0.1)
EOS ABS: 0.3 10*3/uL (ref 0.0–0.7)
EOS PCT: 3 %
HCT: 36.5 % — ABNORMAL LOW (ref 39.0–52.0)
Hemoglobin: 11.2 g/dL — ABNORMAL LOW (ref 13.0–17.0)
LYMPHS ABS: 0.9 10*3/uL (ref 0.7–4.0)
Lymphocytes Relative: 10 %
MCH: 25.7 pg — AB (ref 26.0–34.0)
MCHC: 30.7 g/dL (ref 30.0–36.0)
MCV: 83.7 fL (ref 78.0–100.0)
MONO ABS: 0.9 10*3/uL (ref 0.1–1.0)
MONOS PCT: 10 %
Neutro Abs: 7.1 10*3/uL (ref 1.7–7.7)
Neutrophils Relative %: 77 %
PLATELETS: 285 10*3/uL (ref 150–400)
RBC: 4.36 MIL/uL (ref 4.22–5.81)
RDW: 20 % — AB (ref 11.5–15.5)
WBC: 9.2 10*3/uL (ref 4.0–10.5)

## 2018-02-14 LAB — TSH: TSH: 0.024 u[IU]/mL — ABNORMAL LOW (ref 0.350–4.500)

## 2018-02-14 LAB — T4, FREE: FREE T4: 1.21 ng/dL — AB (ref 0.61–1.12)

## 2018-02-14 MED ORDER — ACETAMINOPHEN 325 MG PO TABS
650.0000 mg | ORAL_TABLET | Freq: Four times a day (QID) | ORAL | Status: DC | PRN
  Administered 2018-02-19 – 2018-02-26 (×4): 650 mg via ORAL
  Filled 2018-02-14 (×5): qty 2

## 2018-02-14 MED ORDER — HYDRALAZINE HCL 20 MG/ML IJ SOLN
5.0000 mg | INTRAMUSCULAR | Status: DC | PRN
  Administered 2018-02-15 – 2018-02-18 (×5): 5 mg via INTRAVENOUS
  Filled 2018-02-14 (×5): qty 1

## 2018-02-14 MED ORDER — ENOXAPARIN SODIUM 100 MG/ML ~~LOC~~ SOLN
1.0000 mg/kg | Freq: Two times a day (BID) | SUBCUTANEOUS | Status: DC
  Administered 2018-02-14 – 2018-02-15 (×2): 85 mg via SUBCUTANEOUS
  Filled 2018-02-14 (×2): qty 1

## 2018-02-14 MED ORDER — ONDANSETRON HCL 4 MG PO TABS: 4.0000 mg | ORAL_TABLET | Freq: Four times a day (QID) | ORAL | Status: DC | PRN

## 2018-02-14 MED ORDER — ACETAMINOPHEN 650 MG RE SUPP: 650.0000 mg | Freq: Four times a day (QID) | RECTAL | Status: DC | PRN

## 2018-02-14 MED ORDER — LACTATED RINGERS IV SOLN
INTRAVENOUS | Status: DC
  Administered 2018-02-14 – 2018-02-16 (×3): via INTRAVENOUS

## 2018-02-14 MED ORDER — METOPROLOL TARTRATE 5 MG/5ML IV SOLN
5.0000 mg | Freq: Three times a day (TID) | INTRAVENOUS | Status: DC
  Administered 2018-02-14 – 2018-02-15 (×3): 5 mg via INTRAVENOUS
  Filled 2018-02-14 (×3): qty 5

## 2018-02-14 MED ORDER — ONDANSETRON HCL 4 MG/2ML IJ SOLN
4.0000 mg | Freq: Four times a day (QID) | INTRAMUSCULAR | Status: DC | PRN
  Administered 2018-03-02: 4 mg via INTRAVENOUS
  Filled 2018-02-14: qty 2

## 2018-02-14 MED ORDER — ALBUTEROL SULFATE (2.5 MG/3ML) 0.083% IN NEBU
3.0000 mL | INHALATION_SOLUTION | Freq: Four times a day (QID) | RESPIRATORY_TRACT | Status: DC | PRN
  Administered 2018-02-14 – 2018-03-02 (×33): 3 mL via RESPIRATORY_TRACT
  Filled 2018-02-14 (×33): qty 3

## 2018-02-14 NOTE — H&P (Addendum)
History and Physical    TRESTEN PANTOJA UXL:[id number] DOB: 12/28/1935 DOA: 02/14/2018  PCP: Sinda Du, MD Consultants:  Lucia Gaskins - ENT; Domenic Polite - cardiology Patient coming from:  Home - lives alone; [facility]:  Daughter, [phone]; son, [phone].  His transportation is Alan Ripper, [phone].  Chief Complaint: sent by ENT  HPI: John Sampson is a 82 y.o. 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  Also with total thyroidectomy and laryngoplasty.  He presented to ENT with increasing SOB and hoarseness.  He was taken to the OR on 3/15 by Dr. Lucia Gaskins for injection of left vocal cord to strengthen his voice.   He presented today to Dr. Pollie Friar office and there was concern for dehydration and dysphagia so he was sent to [facility] for direct admission (actually observation).  Since the procedure, he has been able to have any solid or liquid food - it just won't go down or it goes down and comes right back up.  Also unable to swallow any pills.  He had the procedure Friday and did fine. He was told to go home with a soft diet and he was able to eat and drink that night.  He ate breakfast ok Saturday but he wasn't able to eat after that.  He is peeing 3-4 times daily now.  He hit his ankle Friday and scraped the lateral left ankle on the car.     ED Course: Dr. Lucia Gaskins reports that on in-office exam the vocal cords are clear without swelling or infection but the patient was unable to swallow water in his office.  He recommends IVF, labs, modified barium swallow evaluation for esophageal obstruction/aspiration.  Review of Systems: As per HPI; otherwise review of systems reviewed and negative.   Ambulatory Status:  Ambulates without assistance  Past Medical History:  Diagnosis Date  . Atrial flutter ([place])    on Eliquis  . AV malformation of GI tract   . COPD (chronic obstructive pulmonary disease) ([place]) 3.12.14   2D Echo, EF 50-55%    . Coronary atherosclerosis of native coronary artery    Multivessel status post CABG  . Gastric ulcer    Small - nonbleeding  . GERD (gastroesophageal reflux disease)   . Headache(784.0)   . Hypothyroidism   . Iron deficiency anemia    Negative Givens capsule study   . Myocardial infarction ([place])   . PAD (peripheral artery disease) (H[facility])    Moderate bilateral SFA disease at angiography 01/2013  . Pituitary macroadenoma ([place])   . Thyroid cancer ([place])   . Vocal cord paralysis    left    Past Surgical History:  Procedure Laterality Date  . ABDOMINAL AORTAGRAM N/A 02/19/2013   Procedure: ABDOMINAL Maxcine Ham;  Surgeon: [name], MD;  Location: [facility] CATH LAB;  Service: Cardiovascular;  Laterality: N/A;  . CARDIAC CATHETERIZATION    . CARDIOVERSION N/A 06/16/2015   Procedure: CARDIOVERSION;  Surgeon: Satira Sark, MD;  Location: [facility] ORS;  Service: Cardiovascular;  Laterality: N/A;  . COLONOSCOPY  08/18/2012   Procedure: COLONOSCOPY;  Surgeon: [name], MD;  [facility] ENDO SU[facility];  Service: Endoscopy;  Laterality: N/A;  1:25  . COLONOSCOPY N/A 09/12/2017   Procedure: COLONOSCOPY;  Surgeon: [name], MD;  [facility] END[facility];  Service: Endoscopy;  Laterality: N/A;  . CORONARY ARTERY BYPASS GRAFT  2003   5 grafts - details not clear  . CRANIOTOMY  12/31/2011  Procedure: CRANIOTOMY HYPOPHYSECTOMY TRANSNASAL APPROACH;  Surgeon: [name], MD;  Location: [place] NEURO ORS;  Service: Neurosurgery;  Laterality: N/A;  Transphenoidal Hypophysectomy With Fat Graft Harvest from right abdomen   . ESOPHAGOGASTRODUODENOSCOPY  08/18/2012   Procedure: ESOPHAGOGASTRODUODENOSCOPY (EGD);  Surgeon: [name], MD;  [facility];  Service: Endoscopy;  Laterality: N/A;  . ESOPHAGOGASTRODUODENOSCOPY N/A 03/31/2017   Procedure: ESOPHAGOGASTRODUODENOSCOPY (EGD);  Surgeon: [name], MD;  [facility];  Service: Endoscopy;  Laterality: N/A;  .  ESOPHAGOGASTRODUODENOSCOPY N/A 09/12/2017   Procedure: ESOPHAGOGASTRODUODENOSCOPY (EGD);  Surgeon: [name], MD;  [facility];  Service: Endoscopy;  Laterality: N/A;  10:40  . EYE SURGERY  2012  . GIVENS CAPSULE STUDY N/A 01/25/2013   Procedure: GIVENS CAPSULE STUDY;  Surgeon: [name], MD;  [facility];  Service: Endoscopy;  Laterality: N/A;  730  . HOT HEMOSTASIS  03/31/2017   Procedure: HOT HEMOSTASIS (ARGON PLASMA COAGULATION/BICAP);  Surgeon: [name], MD;  [facility];  Service: Endoscopy;;  duodenum  . LARYNGOPLASTY Left 09/14/2016   Procedure: LARYNGOPLASTY;  Surgeon: [name], MD;  Location: [place];  Service: ENT;  Laterality: Left;  . LOWER EXTREMITY ANGIOGRAM N/A 02/19/2013   Procedure: LOWER EXTREMITY ANGIOGRAM;  Surgeon: [name], MD;  Location: [facility] CATH LAB;  Service: Cardiovascular;  Laterality: N/A;  . MICROLARYNGOSCOPY N/A 02/10/2018   Procedure: MICROLARYNGOSCOPY WITH VOCAL CORD INJECTION;  Surgeon: [name], MD;  Location: [place];  Service: ENT;  Laterality: N/A;  . PERIPHERAL VASCULAR CATHETERIZATION N/A 01/19/2016   Procedure: Abdominal Aortogram;  Surgeon: Conrad [place], MD;  Location: [place] CV LAB;  Service: Cardiovascular;  Laterality: N/A;  . PERIPHERAL VASCULAR CATHETERIZATION Bilateral 01/19/2016   Procedure: Lower Extremity Angiography;  Surgeon: Conrad [place], MD;  Location: [place] CV LAB;  Service: Cardiovascular;  Laterality: Bilateral;  . PV Angiogram  02/19/13   Indications: slow healing left calf ulcer  . Stress Myocardial Perfusion  12/08/2011   Indications: Abnormal EKG, Eval of prior GABG  . TEE WITHOUT CARDIOVERSION N/A 06/16/2015   Procedure: TRANSESOPHAGEAL ECHOCARDIOGRAM (TEE);  Surgeon: Satira Sark, MD;  Location: [facility];  Service: Cardiovascular;  Laterality: N/A;  . THYROIDECTOMY N/A 09/14/2016   Procedure: TOTAL THYROIDECTOMY;  Surgeon:  [name], MD;  Location: [place];  Service: ENT;  Laterality: N/A;  . TONSILLECTOMY  Age 46    Social History   Socioeconomic History  . Marital status: Widowed    Spouse name: Not on file  . Number of children: Not on file  . Years of education: Not on file  . Highest education level: Not on file  Social Needs  . Financial resource strain: Not on file  . Food insecurity - worry: Not on file  . Food insecurity - inability: Not on file  . Transportation needs - medical: Not on file  . Transportation needs - non-medical: Not on file  Occupational History  . Occupation: retired  Tobacco Use  . Smoking status: Former Smoker    Packs/day: 3.00    Years: 45.00    Pack years: 135.00    Types: Cigarettes    Start date: 01/24/1955    Last attempt to quit: 01/23/2002    Years since quitting: 16.0  . Smokeless tobacco: Never Used  Substance and Sexual Activity  . Alcohol use: No    Alcohol/week: 0.0 oz  Comment: "I quit drinking and chasing women" 30 years ago  . Drug use: No  . Sexual activity: No    Partners: Female  Other Topics Concern  . Not on file  Social History Narrative  . Not on file    Allergies  Allergen Reactions  . No Known Allergies     Family History  Problem Relation Age of Onset  . CAD Mother   . Heart disease Father        before age 30    Prior to Admission medications   Medication Sig Start Date End Date Taking? Authorizing Provider  albuterol (PROVENTIL HFA) 108 (90 BASE) MCG/ACT inhaler Inhale 2 puffs into the lungs every 6 (six) hours as needed for wheezing or shortness of breath. 11/15/14   Satira Sark, MD  apixaban (ELIQUIS) 5 MG TABS tablet Take 1 tablet (5 mg total) by mouth 2 (two) times daily. Hold until Monday then start 1 tablet twice a day as before 09/15/17   [name], MD  diltiazem (CARDIZEM CD) 240 MG 24 hr capsule Take 1 capsule (240 mg total) by mouth daily. 05/30/15   Lendon Colonel, NP  folic acid  (FOLVITE) 1 MG tablet Take 1 mg by mouth daily.     [provider]  furosemide (LASIX) 20 MG tablet Take 1 tablet (20 mg total) by mouth daily. 01/11/18 04/11/18  Imogene Burn, PA-C  levothyroxine (SYNTHROID, LEVOTHROID) 125 MCG tablet Take 125 mcg by mouth daily before breakfast.     [provider]  lisinopril (PRINIVIL,ZESTRIL) 40 MG tablet Take 1 tablet (40 mg total) by mouth daily. 01/25/18 04/25/18  Imogene Burn, PA-C  metoprolol succinate (TOPROL-XL) 50 MG 24 hr tablet Take 50 mg by mouth 2 (two) times daily. Take with or immediately following a meal.    [provider]  mirtazapine (REMERON) 15 MG tablet Take 15 mg by mouth at bedtime.    [provider]  nitroGLYCERIN (NITROSTAT) 0.4 MG SL tablet Place 1 tablet (0.4 mg total) under the tongue every 5 (five) minutes as needed for chest pain. 06/07/14   Satira Sark, MD  omeprazole (PRILOSEC) 20 MG capsule Take 20 mg by mouth 2 (two) times daily before a meal.    [provider]  potassium chloride SA (K-DUR,KLOR-CON) 20 MEQ tablet Take 1 tablet (20 mEq total) by mouth daily. 01/11/18   Imogene Burn, PA-C  simvastatin (ZOCOR) 40 MG tablet Take 0.5 tablets (20 mg total) by mouth at bedtime. 07/25/16   Sinda Du, MD    Physical Exam: Vitals:   02/14/18 1452 02/14/18 1507  BP:  (!) 174/75  Pulse:  74  Resp:  18  Temp: (!) 97.5 F (36.4 C) (!) 97.5 F (36.4 C)  TempSrc:  Oral  SpO2:  94%  Weight:  85.3 kg (188 lb 0.8 oz)  Height:  5\' 9"  (1.753 m)     General: Appears calm and comfortable but somewhat ill and clearly with malaise Eyes:   EOMI, normal lids, iris ENT:  grossly normal hearing, lips & tongue, mmm; hoarseness noted Neck:  no LAD, masses or thyromegaly; no carotid bruits Cardiovascular:  RRR, no m/r/g. No LE edema.  Respiratory:   CTA bilaterally with no wheezes/rales/rhonchi.  Normal respiratory effort. Abdomen:  soft, NT, ND, NABS Skin: abrasion on the  left lateral ankle/lower leg with skin tear; does not appear to be infected, no purulent drainage, mild surrounding erythema  Musculoskeletal:  grossly normal tone BUE/BLE, good ROM, no bony abnormality Psychiatric:  blunted mood and affect, speech fluent and appropriate, AOx3 Neurologic:  CN 2-12 grossly intact, moves all extremities in coordinated fashion, sensation intact    Radiological Exams on Admission: No results found.  EKG: not done   Labs on Admission: I have personally reviewed the available labs and imaging studies at the time of the admission.  Pertinent labs:   K+ 3.4 Glucose 100 BUN 25/Creatinine 0.98/GFR >60; prior 14/0.82/>60 on 2/27 WBC 9.2 Hgb 11.2 TSH 0.024/Free T4 1.21   Assessment/Plan Principal Problem:   Dysphagia Active Problems:   Essential hypertension, benign   Mixed hyperlipidemia   PAF (paroxysmal atrial fibrillation) (HCC)   Chronic diastolic congestive heart failure (HCC)   Hypothyroidism (acquired)   Abrasion of lower leg, left, initial encounter   Dysphagia -Temporally expected to be related to vocal cord injection performed 3/15 -However, on nasolaryngoscopy the patient has normally-appearing cords -He may have an esophageal obstruction and/or aspiration -Modified barium swallow evaluation was ordered, but radiology called to report that speech therapy needs to perform this evaluation -I attempted to page speech therapy twice but did not receive a return phone call -Order has been placed for speech therapy swallow evaluation, which appears likely to happen tomorrow -Patient with mild dehydration resulting from inability to tolerate PO, so will gently rehydrate and recheck BMP in AM  Afib on Xarelto -The patient reports inability to tolerate Xarelto (or other solids/liquids/pills) -Will give treatment-dose Lovenox for now -Resume PO anticoagulation once dysphagia has resolved -For now, will attempt rate control with standing  IV Lopressor  -Hopefully he will not require a Cardizem drip to maintain rate control  Abrasion -Does not appear to be infected at this time -Routine wound care by nursing  Hypothyroidism -Patient appears to be oversuppressed and currently hyperthyroid by labs -Holding synthroid for now since he can't take PO -With resumption of Synthroid, would suggest decreasing dose to 100 mcg daily and rechecking TSH/free T4 in 4-6 weeks  HTN -Patient is unable to take his home PO meds -For now, will cover with standing IV Lopressor and prn hydralazine  HLD -Hold Zocor while NPO  Chronic diastolic heart failure -Needs IVF -Will monitor for evidence of volume overload -Currently compensated   DVT prophylaxis: Treatment-dose Lovenox Code Status:  DNR - confirmed with patient/family Family Communication: Friend present throughout evaluation  Disposition Plan:  Home once clinically improved Consults called: ENT (sent patient in); speech therapy  Admission status: It is my clinical opinion that referral for OBSERVATION is reasonable and necessary in this patient based on the above information provided. The aforementioned taken together are felt to place the patient at high risk for further clinical deterioration. However it is anticipated that the patient may be medically stable for discharge from the hospital within 24 to 48 hours.    Karmen Bongo MD [facility]  If note is complete, please contact covering daytime or nighttime physician. [email or web address] Password [facility]  02/14/2018, 6:11 PM

## 2018-02-14 NOTE — Progress Notes (Signed)
ANTICOAGULATION CONSULT NOTE - Initial Consult  Pharmacy Consult for enoxaparin Indication: atrial fibrillation  Allergies  Allergen Reactions  . No Known Allergies     Patient Measurements: Weight: 188 lb 0.8 oz (85.3 kg)  Height: 5'9" Heparin Dosing Weight: 85.3 kg   Vital Signs: Temp: 97.5 F (36.4 C) (03/19 1507) Temp Source: Oral (03/19 1507) BP: 174/75 (03/19 1507) Pulse Rate: 74 (03/19 1507)  Labs: Recent Labs    02/14/18 1601  HGB 11.2*  HCT 36.5*  PLT 285    Estimated Creatinine Clearance: 77.7 mL/min (by C-G formula based on SCr of 0.82 mg/dL).   Medical History: Past Medical History:  Diagnosis Date  . Atrial flutter ([place])    on Eliquis  . AV malformation of GI tract   . COPD (chronic obstructive pulmonary disease) ([place]) 3.12.14   2D Echo, EF 50-55%  . Coronary atherosclerosis of native coronary artery    Multivessel status post CABG  . Gastric ulcer    Small - nonbleeding  . GERD (gastroesophageal reflux disease)   . Headache(784.0)   . Hypothyroidism   . Iron deficiency anemia    Negative Givens capsule study   . Myocardial infarction ([place])   . PAD (peripheral artery disease) ([facility])    Moderate bilateral SFA disease at angiography 01/2013  . Pituitary macroadenoma ([place])   . Thyroid cancer ([place])   . Vocal cord paralysis    left    Medications:  Scheduled:  . enoxaparin (LOVENOX) injection  1 mg/kg Subcutaneous Q12H  . metoprolol tartrate  5 mg Intravenous Q8H    Assessment: 6 yom with history of L papillary thyroid carcinoma presenting with dehydration and dysphagia. On apixaban PTA for atrial flutter - patient attests last dose was on 3/17 in AM.   Pharmacy consulted to start enoxaparin given dysphagia. Hgb is 11.2, plts WNL. Renal function is stable (Scr 0.98, CrCl ~60 mL/min). No signs/symptoms of bleeding.   Goal of Therapy:  Monitor platelets by anticoagulation protocol: Yes   Plan:  Order enoxaparin 1 mg/kg (85 mg) every 12  hours Monitor CBC, renal fx, and for signs/symptoms of bleeding  Doylene Canard, PharmD Clinical Pharmacist  Pager: [phone] Phone: [phone] 02/14/2018,4:39 PM

## 2018-02-15 ENCOUNTER — Observation Stay (HOSPITAL_COMMUNITY): Payer: Medicare Other

## 2018-02-15 DIAGNOSIS — R1310 Dysphagia, unspecified: Secondary | ICD-10-CM | POA: Diagnosis not present

## 2018-02-15 DIAGNOSIS — I509 Heart failure, unspecified: Secondary | ICD-10-CM | POA: Diagnosis not present

## 2018-02-15 DIAGNOSIS — R490 Dysphonia: Secondary | ICD-10-CM | POA: Diagnosis not present

## 2018-02-15 DIAGNOSIS — J80 Acute respiratory distress syndrome: Secondary | ICD-10-CM | POA: Diagnosis not present

## 2018-02-15 DIAGNOSIS — E782 Mixed hyperlipidemia: Secondary | ICD-10-CM | POA: Diagnosis present

## 2018-02-15 DIAGNOSIS — Z66 Do not resuscitate: Secondary | ICD-10-CM | POA: Diagnosis present

## 2018-02-15 DIAGNOSIS — C7801 Secondary malignant neoplasm of right lung: Secondary | ICD-10-CM | POA: Diagnosis present

## 2018-02-15 DIAGNOSIS — Z8249 Family history of ischemic heart disease and other diseases of the circulatory system: Secondary | ICD-10-CM | POA: Diagnosis not present

## 2018-02-15 DIAGNOSIS — R262 Difficulty in walking, not elsewhere classified: Secondary | ICD-10-CM | POA: Diagnosis not present

## 2018-02-15 DIAGNOSIS — C73 Malignant neoplasm of thyroid gland: Secondary | ICD-10-CM | POA: Diagnosis not present

## 2018-02-15 DIAGNOSIS — K8020 Calculus of gallbladder without cholecystitis without obstruction: Secondary | ICD-10-CM | POA: Diagnosis not present

## 2018-02-15 DIAGNOSIS — J3800 Paralysis of vocal cords and larynx, unspecified: Secondary | ICD-10-CM | POA: Diagnosis present

## 2018-02-15 DIAGNOSIS — K219 Gastro-esophageal reflux disease without esophagitis: Secondary | ICD-10-CM | POA: Diagnosis present

## 2018-02-15 DIAGNOSIS — R0600 Dyspnea, unspecified: Secondary | ICD-10-CM | POA: Diagnosis not present

## 2018-02-15 DIAGNOSIS — Z7901 Long term (current) use of anticoagulants: Secondary | ICD-10-CM | POA: Diagnosis not present

## 2018-02-15 DIAGNOSIS — I110 Hypertensive heart disease with heart failure: Secondary | ICD-10-CM | POA: Diagnosis present

## 2018-02-15 DIAGNOSIS — J449 Chronic obstructive pulmonary disease, unspecified: Secondary | ICD-10-CM | POA: Diagnosis present

## 2018-02-15 DIAGNOSIS — R2681 Unsteadiness on feet: Secondary | ICD-10-CM | POA: Diagnosis not present

## 2018-02-15 DIAGNOSIS — I5033 Acute on chronic diastolic (congestive) heart failure: Secondary | ICD-10-CM | POA: Diagnosis present

## 2018-02-15 DIAGNOSIS — Z4659 Encounter for fitting and adjustment of other gastrointestinal appliance and device: Secondary | ICD-10-CM | POA: Diagnosis not present

## 2018-02-15 DIAGNOSIS — E860 Dehydration: Secondary | ICD-10-CM | POA: Diagnosis present

## 2018-02-15 DIAGNOSIS — S80812A Abrasion, left lower leg, initial encounter: Secondary | ICD-10-CM | POA: Diagnosis not present

## 2018-02-15 DIAGNOSIS — J441 Chronic obstructive pulmonary disease with (acute) exacerbation: Secondary | ICD-10-CM | POA: Diagnosis not present

## 2018-02-15 DIAGNOSIS — I480 Paroxysmal atrial fibrillation: Secondary | ICD-10-CM | POA: Diagnosis not present

## 2018-02-15 DIAGNOSIS — I2510 Atherosclerotic heart disease of native coronary artery without angina pectoris: Secondary | ICD-10-CM | POA: Diagnosis present

## 2018-02-15 DIAGNOSIS — D72829 Elevated white blood cell count, unspecified: Secondary | ICD-10-CM | POA: Diagnosis not present

## 2018-02-15 DIAGNOSIS — I5032 Chronic diastolic (congestive) heart failure: Secondary | ICD-10-CM

## 2018-02-15 DIAGNOSIS — E890 Postprocedural hypothyroidism: Secondary | ICD-10-CM | POA: Diagnosis present

## 2018-02-15 DIAGNOSIS — Z79899 Other long term (current) drug therapy: Secondary | ICD-10-CM | POA: Diagnosis not present

## 2018-02-15 DIAGNOSIS — M6281 Muscle weakness (generalized): Secondary | ICD-10-CM | POA: Diagnosis not present

## 2018-02-15 DIAGNOSIS — Z789 Other specified health status: Secondary | ICD-10-CM

## 2018-02-15 DIAGNOSIS — I739 Peripheral vascular disease, unspecified: Secondary | ICD-10-CM | POA: Diagnosis present

## 2018-02-15 DIAGNOSIS — Z79890 Hormone replacement therapy (postmenopausal): Secondary | ICD-10-CM | POA: Diagnosis not present

## 2018-02-15 DIAGNOSIS — I10 Essential (primary) hypertension: Secondary | ICD-10-CM | POA: Diagnosis not present

## 2018-02-15 DIAGNOSIS — E039 Hypothyroidism, unspecified: Secondary | ICD-10-CM | POA: Diagnosis not present

## 2018-02-15 DIAGNOSIS — E43 Unspecified severe protein-calorie malnutrition: Secondary | ICD-10-CM | POA: Diagnosis not present

## 2018-02-15 DIAGNOSIS — Z87891 Personal history of nicotine dependence: Secondary | ICD-10-CM | POA: Diagnosis not present

## 2018-02-15 DIAGNOSIS — Z951 Presence of aortocoronary bypass graft: Secondary | ICD-10-CM | POA: Diagnosis not present

## 2018-02-15 DIAGNOSIS — I252 Old myocardial infarction: Secondary | ICD-10-CM | POA: Diagnosis not present

## 2018-02-15 DIAGNOSIS — C7802 Secondary malignant neoplasm of left lung: Secondary | ICD-10-CM | POA: Diagnosis present

## 2018-02-15 DIAGNOSIS — R1314 Dysphagia, pharyngoesophageal phase: Secondary | ICD-10-CM | POA: Diagnosis present

## 2018-02-15 LAB — BASIC METABOLIC PANEL
ANION GAP: 12 (ref 5–15)
BUN: 23 mg/dL — ABNORMAL HIGH (ref 6–20)
CO2: 22 mmol/L (ref 22–32)
Calcium: 8.9 mg/dL (ref 8.9–10.3)
Chloride: 106 mmol/L (ref 101–111)
Creatinine, Ser: 0.81 mg/dL (ref 0.61–1.24)
GFR calc Af Amer: >60 mL/min (ref >60)
GFR calc non Af Amer: >60 mL/min (ref >60)
GLUCOSE: 78 mg/dL (ref 65–99)
POTASSIUM: 4.2 mmol/L (ref 3.5–5.1)
Sodium: 140 mmol/L (ref 135–145)

## 2018-02-15 LAB — CBC
HEMATOCRIT: 35.8 % — AB (ref 39.0–52.0)
HEMOGLOBIN: 11.1 g/dL — AB (ref 13.0–17.0)
MCH: 26.2 pg (ref 26.0–34.0)
MCHC: 31 g/dL (ref 30.0–36.0)
MCV: 84.6 fL (ref 78.0–100.0)
Platelets: 281 10*3/uL (ref 150–400)
RBC: 4.23 MIL/uL (ref 4.22–5.81)
RDW: 20.7 % — ABNORMAL HIGH (ref 11.5–15.5)
WBC: 9.3 10*3/uL (ref 4.0–10.5)

## 2018-02-15 MED ORDER — DEXAMETHASONE SODIUM PHOSPHATE 10 MG/ML IJ SOLN
4.0000 mg | Freq: Four times a day (QID) | INTRAMUSCULAR | Status: DC
  Administered 2018-02-15 – 2018-02-18 (×12): 4 mg via INTRAVENOUS
  Filled 2018-02-15 (×12): qty 1

## 2018-02-15 MED ORDER — ORAL CARE MOUTH RINSE
15.0000 mL | Freq: Two times a day (BID) | OROMUCOSAL | Status: DC
  Administered 2018-02-15 – 2018-03-02 (×29): 15 mL via OROMUCOSAL

## 2018-02-15 MED ORDER — LIDOCAINE VISCOUS 2 % MT SOLN
OROMUCOSAL | Status: AC
  Filled 2018-02-15: qty 15

## 2018-02-15 MED ORDER — MORPHINE SULFATE (PF) 4 MG/ML IV SOLN
1.0000 mg | Freq: Once | INTRAVENOUS | Status: DC
  Filled 2018-02-15 (×3): qty 1

## 2018-02-15 MED ORDER — JEVITY 1.2 CAL PO LIQD
1000.0000 mL | ORAL | Status: DC
  Administered 2018-02-15: 35 mL/h
  Administered 2018-02-16 – 2018-02-27 (×15): 1000 mL
  Administered 2018-02-28: 1 mL
  Administered 2018-03-01 (×2): 1000 mL
  Filled 2018-02-15 (×30): qty 1000

## 2018-02-15 MED ORDER — IOPAMIDOL (ISOVUE-300) INJECTION 61%
50.0000 mL | Freq: Once | INTRAVENOUS | Status: AC | PRN
  Administered 2018-02-15: 50 mL via ORAL

## 2018-02-15 MED ORDER — IOPAMIDOL (ISOVUE-300) INJECTION 61%
INTRAVENOUS | Status: AC
  Filled 2018-02-15: qty 50

## 2018-02-15 MED ORDER — PRO-STAT SUGAR FREE PO LIQD
30.0000 mL | Freq: Two times a day (BID) | ORAL | Status: DC
  Administered 2018-02-15 – 2018-03-02 (×29): 30 mL
  Filled 2018-02-15 (×30): qty 30

## 2018-02-15 MED ORDER — METOPROLOL TARTRATE 5 MG/5ML IV SOLN
5.0000 mg | Freq: Four times a day (QID) | INTRAVENOUS | Status: DC
  Administered 2018-02-15 – 2018-02-26 (×40): 5 mg via INTRAVENOUS
  Filled 2018-02-15 (×43): qty 5

## 2018-02-15 MED ORDER — SODIUM CHLORIDE 0.9 % IV SOLN
1.5000 g | Freq: Four times a day (QID) | INTRAVENOUS | Status: DC
  Administered 2018-02-15 – 2018-02-20 (×20): 1.5 g via INTRAVENOUS
  Filled 2018-02-15 (×24): qty 1.5

## 2018-02-15 MED ORDER — WHITE PETROLATUM GEL
Status: DC | PRN
  Administered 2018-02-15: 18:00:00 via TOPICAL
  Filled 2018-02-15: qty 28.35

## 2018-02-15 MED ORDER — ENOXAPARIN SODIUM 100 MG/ML ~~LOC~~ SOLN
1.0000 mg/kg | Freq: Two times a day (BID) | SUBCUTANEOUS | Status: DC
  Administered 2018-02-16 – 2018-02-23 (×16): 85 mg via SUBCUTANEOUS
  Filled 2018-02-15 (×16): qty 1

## 2018-02-15 NOTE — Progress Notes (Signed)
Modified Barium Swallow Progress Note  Patient Details  Name: John Sampson MRN: [id number] Date of Birth: [birth year]  Today's Date: 02/15/2018  Modified Barium Swallow completed.  Full report located under Chart Review in the Imaging Section.  Brief recommendations include the following:  Clinical Impression  Pt demosntrates a severe cervical esophageal dysphagia with obstructing tissue at C5/6 and C7. Pt is unable to pass 1/4 teaspoon of thin liquids despite maximal effortful swallow or a head turn R or L. Chin tuck also ineffective. Attempted large bolus to adress if heavier propulsion could force more opening without success. 90% or more of the bolus returned to the pharynx and was aspriated with sensation. Pt does have a hard cough ability and can expectorate residue. Recommend pt remain fully NPO. Discussed result with MD, will need ENT to review study images and address needs. Pt may need a temporary means of nutrition, though passage of any NG would likely be difficult even under fluoroscopy and could be contraindicated. Esophagram discontinued due to pt inability to consume any quantity of barium for further esophageal assessment.    Swallow Evaluation Recommendations   Recommended Consults: Consider ENT evaluation   SLP Diet Recommendations: NPO;Alternative means - temporary       Medication Administration: Via alternative means                      Herbie Baltimore, MA CCC-SLP [phone]   Raimundo Corbit, Katherene Ponto 02/15/2018,10:40 AM

## 2018-02-15 NOTE — Progress Notes (Signed)
Tube feeding JEVITY 1.2 CAL started via Cortrak tube at 18ml/hr per administration instructions @ 2052. Instructions to increase tube feeding by 10 ml/hr every 12 hours to goal of 65 ml/hr. Next increase due @ 0852.

## 2018-02-15 NOTE — Progress Notes (Addendum)
PROGRESS NOTE    HAYZEN LORENSON  JAS:[id number] DOB: 1936-08-19 DOA: 02/14/2018 PCP: Sinda Du, MD  Brief Narrative:James C Hippler is a 82 y.o. 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  He presented to ENT with increasing SOB and hoarseness found to have VOcal cord paralysis.  He was taken to the OR on 3/15 by Dr. Lucia Gaskins and underwent prolaryn injection to vocal cords, then presented 3/19  to Dr. Pollie Friar office and there was concern for dehydration and dysphagia so he was sent to [facility] for direct admission.  Since the procedure, he has been able to have any solid or liquid food - it just won't go down or it goes down and comes right back up.  Also unable to swallow any pills.  Modified Barium swallow 3/20 with severe cervical esophageal swelling with resultant dysphagia with obstructing tissue at C5/6 and C7. Pt is unable to pass 1/4 teaspoon of thin liquids despite maximal effortful swallow    Assessment & Plan:   Severe Cervical esophageal swelling with dysphagia -s/p MBS today which notes Severe cervical esophageal swelling with resultant dysphagia with obstructing tissue at C5/6 and C7. Pt is unable to pass 1/4 teaspoon of thin liquids despite maximal effortful swallow  -Status post prolaryn injection to vocal cords 3/15 for vocal cord paralysis -With resultant profound dysphagia  -d/w ENt, Dr.Newman, he will follow -start IV decadron, Unasyn -expect this to improve in a few days -Small Cortrak or NGT under fluoroscopy per IR for nutrition and meds  P.Afib on Xarelto -in NSR, and HR controlled -hold Xarelto, continue full dose lovenox for now -PO metoprolol and Diltiazem on hold -continue IV lopressor in the interim  H/o Hypothyroidism -currently hyperthyroid by labs, oversupressed -recheck levels and resume synthroid at very small dose in  4-6weeks  HTN -unable to tolerate PO, IV metoprolol and when necessary hydralazine for now  HLD -Hold Zocor   Chronic diastolic heart failure -clinically compensated,continue IV fluids for now, cutdown rate   DVT prophylaxis: Treatment-dose Lovenox Code Status:  DNR  Family Communication: . Family at bedside Disposition Plan:  home when improved, able to swallow safely likely 4-5 days at least  Consults  - ENT   Procedures:   Antimicrobials:    Subjective: -continues to have trouble swallowing anything including secretions  Objective: Vitals:   02/14/18 2052 02/14/18 2150 02/15/18 0425 02/15/18 0600  BP:  (!) 182/58 (!) 199/68 (!) 169/54  Pulse: 71 60 (!) 59   Resp: 18     Temp:  98.8 F (37.1 C) 99 F (37.2 C)   TempSrc:  Oral Oral   SpO2: 95% 97% 97%   Weight:      Height:        Intake/Output Summary (Last 24 hours) at 02/15/2018 1325 Last data filed at 02/15/2018 0425 Gross per 24 hour  Intake 0 ml  Output 250 ml  Net -250 ml   Filed Weights   02/14/18 1507  Weight: 85.3 kg (188 lb 0.8 oz)    Examination:  General exam: frail elderly male, sitting in bed, no distress Respiratory system: Clear to auscultation. Respiratory effort normal. Cardiovascular system: S1 & S2 heard, RRR Gastrointestinal system: Abdomen is nondistended, soft and nontender.Normal bowel sounds heard. Central nervous system: Alert and oriented. No focal neurological deficits. Extremities: Symmetric 5 x 5 power. Skin: No rashes, lesions or ulcers Psychiatry: Judgement and insight  appear normal. Mood & affect appropriate.     Data Reviewed:   CBC: Recent Labs  Lab 02/14/18 1601 02/15/18 0552  WBC 9.2 9.3  NEUTROABS 7.1  --   HGB 11.2* 11.1*  HCT 36.5* 35.8*  MCV 83.7 84.6  PLT 285 354   Basic Metabolic Panel: Recent Labs  Lab 02/14/18 1601 02/15/18 0552  NA 140 140  K 3.4* 4.2  CL 105 106  CO2 24 22  GLUCOSE 100* 78  BUN 25* 23*  CREATININE 0.98 0.81   CALCIUM 9.3 8.9   GFR: Estimated Creatinine Clearance: 78.7 mL/min (by C-G formula based on SCr of 0.81 mg/dL). Liver Function Tests: Recent Labs  Lab 02/14/18 1601  AST 30  ALT 22  ALKPHOS 62  BILITOT 1.3*  PROT 6.7  ALBUMIN 3.6   No results for input(s): LIPASE, AMYLASE in the last 168 hours. No results for input(s): AMMONIA in the last 168 hours. Coagulation Profile: No results for input(s): INR, PROTIME in the last 168 hours. Cardiac Enzymes: No results for input(s): CKTOTAL, CKMB, CKMBINDEX, TROPONINI in the last 168 hours. BNP (last 3 results) No results for input(s): PROBNP in the last 8760 hours. HbA1C: No results for input(s): HGBA1C in the last 72 hours. CBG: No results for input(s): GLUCAP in the last 168 hours. Lipid Profile: No results for input(s): CHOL, HDL, LDLCALC, TRIG, CHOLHDL, LDLDIRECT in the last 72 hours. Thyroid Function Tests: Recent Labs    02/14/18 1601  TSH 0.024*  FREET4 1.21*   Anemia Panel: No results for input(s): VITAMINB12, FOLATE, FERRITIN, TIBC, IRON, RETICCTPCT in the last 72 hours. Urine analysis:    Component Value Date/Time   COLORURINE YELLOW 12/06/2017 1215   APPEARANCEUR HAZY (A) 12/06/2017 1215   LABSPEC 1.021 12/06/2017 1215   PHURINE 5.0 12/06/2017 1215   GLUCOSEU NEGATIVE 12/06/2017 1215   HGBUR LARGE (A) 12/06/2017 1215   BILIRUBINUR NEGATIVE 12/06/2017 1215   KETONESUR NEGATIVE 12/06/2017 1215   PROTEINUR 30 (A) 12/06/2017 1215   NITRITE NEGATIVE 12/06/2017 1215   LEUKOCYTESUR TRACE (A) 12/06/2017 1215   Sepsis Labs: @LABRCNTIP (procalcitonin:4,lacticidven:4)  )No results found for this or any previous visit (from the past 240 hour(s)).       Radiology Studies: Dg Swallowing Func-speech Pathology  Result Date: 02/15/2018 Objective Swallowing Evaluation: Type of Study: MBS-Modified Barium Swallow Study  Patient Details Name: MCCADE SULLENBERGER MRN: [id number] Date of Birth: 1936/03/01 Today's Date: 02/15/2018  Time: SLP Start Time (ACUTE ONLY): 1000 -SLP Stop Time (ACUTE ONLY): 1027 SLP Time Calculation (min) (ACUTE ONLY): 27 min Past Medical History: Past Medical History: Diagnosis Date . Atrial flutter ([place])   on Eliquis . AV malformation of GI tract  . COPD (chronic obstructive pulmonary disease) ([place]) 3.12.14  2D Echo, EF 50-55% . Coronary atherosclerosis of native coronary artery   Multivessel status post CABG . Gastric ulcer   Small - nonbleeding . GERD (gastroesophageal reflux disease)  . Headache(784.0)  . Hypothyroidism  . Iron deficiency anemia   Negative Givens capsule study  . Myocardial infarction ([place])  . PAD (peripheral artery disease) ([facility])   Moderate bilateral SFA disease at angiography 01/2013 . Pituitary macroadenoma ([place])  . Thyroid cancer ([place])  . Vocal cord paralysis   left Past Surgical History: Past Surgical History: Procedure Laterality Date . ABDOMINAL AORTAGRAM N/A 02/19/2013  Procedure: ABDOMINAL Maxcine Ham;  Surgeon: [name], MD;  Location: [facility] CATH LAB;  Service: Cardiovascular;  Laterality: N/A; . CARDIAC CATHETERIZATION   . CARDIOVERSION N/A  06/16/2015  Procedure: CARDIOVERSION;  Surgeon: Satira Sark, MD;  Location: [facility] ORS;  Service: Cardiovascular;  Laterality: N/A; . COLONOSCOPY  08/18/2012  Procedure: COLONOSCOPY;  Surgeon: [name], MD;  Location: [facility] ENDO SUITE;  Service: Endoscopy;  Laterality: N/A;  1:25 . COLONOSCOPY N/A 09/12/2017  Procedure: COLONOSCOPY;  Surgeon: [name], MD;  Location[facility] ENDO SU[facility];  Service: Endoscopy;  Laterality: N/A; . CORONARY ARTERY BYPASS GRAFT  2003  5 grafts - details not clear . CRANIOTOMY  12/31/2011  Procedure: CRANIOTOMY HYPOPHYSECTOMY TRANSNASAL APPROACH;  Surgeon: [name], MD;  Location: [place] NEURO ORS;  Service: Neurosurgery;  Laterality: N/A;  Transphenoidal Hypophysectomy With Fat Graft Harvest from right abdomen  . ESOPHAGOGASTRODUODENOSCOPY  08/18/2012  Procedure: ESOPHAGOGASTRODUODENOSCOPY (EGD);  Surgeon: [name], MD;  [facility];  Service: Endoscopy;  Laterality: N/A; . ESOPHAGOGASTRODUODENOSCOPY N/A 03/31/2017  Procedure: ESOPHAGOGASTRODUODENOSCOPY (EGD);  Surgeon: [name], MD;  [facility];  Service: Endoscopy;  Laterality: N/A; . ESOPHAGOGASTRODUODENOSCOPY N/A 09/12/2017  Procedure: ESOPHAGOGASTRODUODENOSCOPY (EGD);  Surgeon: [name], MD;  [facility];  Service: Endoscopy;  Laterality: N/A;  10:40 . EYE SURGERY  2012 . GIVENS CAPSULE STUDY N/A 01/25/2013  Procedure: GIVENS CAPSULE STUDY;  Surgeon: [name], MD;  [facility];  Service: Endoscopy;  Laterality: N/A;  730 . HOT HEMOSTASIS  03/31/2017  Procedure: HOT HEMOSTASIS (ARGON PLASMA COAGULATION/BICAP);  Surgeon: [name], MD;  [facility];  Service: Endoscopy;;  duodenum . LARYNGOPLASTY Left 09/14/2016  Procedure: LARYNGOPLASTY;  Surgeon: [name], MD;  Location: [place];  Service: ENT;  Laterality: Left; . LOWER EXTREMITY ANGIOGRAM N/A 02/19/2013  Procedure: LOWER EXTREMITY ANGIOGRAM;  Surgeon: [name], MD;  Location: [facility] CATH LAB;  Service: Cardiovascular;  Laterality: N/A; . MICROLARYNGOSCOPY N/A 02/10/2018  Procedure: MICROLARYNGOSCOPY WITH VOCAL CORD INJECTION;  Surgeon: [name], MD;  Location: [place];  Service: ENT;  Laterality: N/A; . PERIPHERAL VASCULAR CATHETERIZATION N/A 01/19/2016  Procedure: Abdominal Aortogram;  Surgeon: Conrad Harrisville, MD;  Location: [place] CV LAB;  Service: Cardiovascular;  Laterality: N/A; . PERIPHERAL VASCULAR CATHETERIZATION Bilateral 01/19/2016  Procedure: Lower Extremity Angiography;  Surgeon: Conrad Kempton, MD;  Location: [place] CV LAB;  Service: Cardiovascular;  Laterality: Bilateral; . PV Angiogram  02/19/13  Indications: slow healing left calf ulcer . Stress Myocardial Perfusion  12/08/2011  Indications: Abnormal EKG, Eval of prior GABG . TEE WITHOUT CARDIOVERSION N/A 06/16/2015   Procedure: TRANSESOPHAGEAL ECHOCARDIOGRAM (TEE);  Surgeon: Satira Sark, MD;  Location[facility];  Service: Cardiovascular;  Laterality: N/A; . THYROIDECTOMY N/A 09/14/2016  Procedure: TOTAL THYROIDECTOMY;  Surgeon: [name], MD;  Location: [place];  Service: ENT;  Laterality: N/A; . TONSILLECTOMY  Age 28 HPI: [name] a 82 y.o.malewith medical history significant ofleft papillary thyroid carcinoma with paralysis of left vocal cord. Also with total thyroidectomy and laryngoplasty. He presented to [facility] increasing SOB and hoarseness. He was taken to the OR on 3/15 by Dr. Lucia Gaskins for injection of left vocal cord to strengthen his voice. He presented today to Dr. Pollie Friar office and there was concern for dehydration and dysphagia so he was sent to [facility] for direct admission (actually observation). Since the procedure, he has been able to have any solid or liquid food - it just won't go down or it goes down and comes right back up. Also unable to swallow any pills. He  had the procedure Friday and did fine. He was told to go home with a soft diet and he was able to eat and drink that night. He ate breakfast ok Saturday but he wasn't able to eat after that. Dr. Shirlee More that on in-office exam the vocal cords are clear without swelling or infection but the patient was unable to swallow water in his office. He recommends IVF, labs, modified barium swallow evaluationfor esophageal obstruction/aspiration.  No Data Recorded Assessment / Plan / Recommendation CHL IP CLINICAL IMPRESSIONS 02/15/2018 Clinical Impression Pt demosntrates a severe cervical esophageal dysphagia with obstructing tissue at C5/6 and C7. Pt is unable to pass 1/4 teaspoon of thin liquids despite maximal effortful swallow or a head turn R or L. Chin tuck also ineffective. Attempted large bolus to adress if heavier propulsion could force more opening without success. 90% or more of the bolus returned to the pharynx and  was aspriated with sensation. Pt does have a hard cough ability and can expectorate residue. Recommend pt remain fully NPO. Discussed result with MD, will need ENT to review study images and address needs. Pt may need a temporary means of nutrition, though passage of any NG would likely be difficult even under fluoroscopy and could be contraindicated. Esophagram discontinued due to pt inability to consume any quantity of barium for further esophageal assessment.  SLP Visit Diagnosis Dysphagia, pharyngoesophageal phase (R13.14) Attention and concentration deficit following -- Frontal lobe and executive function deficit following -- Impact on safety and function Severe aspiration risk;Risk for inadequate nutrition/hydration   CHL IP TREATMENT RECOMMENDATION 02/15/2018 Treatment Recommendations Therapy as outlined in treatment plan below   Prognosis 02/15/2018 Prognosis for Safe Diet Advancement Good Barriers to Reach Goals -- Barriers/Prognosis Comment -- CHL IP DIET RECOMMENDATION 02/15/2018 SLP Diet Recommendations NPO;Alternative means - temporary Liquid Administration via -- Medication Administration Via alternative means Compensations -- Postural Changes --   CHL IP OTHER RECOMMENDATIONS 02/15/2018 Recommended Consults Consider ENT evaluation Oral Care Recommendations -- Other Recommendations --   CHL IP FOLLOW UP RECOMMENDATIONS 02/15/2018 Follow up Recommendations 24 hour supervision/assistance   CHL IP FREQUENCY AND DURATION 02/15/2018 Speech Therapy Frequency (ACUTE ONLY) min 2x/week Treatment Duration 2 weeks      CHL IP ORAL PHASE 02/15/2018 Oral Phase WFL Oral - Pudding Teaspoon -- Oral - Pudding Cup -- Oral - Honey Teaspoon -- Oral - Honey Cup -- Oral - Nectar Teaspoon -- Oral - Nectar Cup -- Oral - Nectar Straw -- Oral - Thin Teaspoon -- Oral - Thin Cup -- Oral - Thin Straw -- Oral - Puree -- Oral - Mech Soft -- Oral - Regular -- Oral - Multi-Consistency -- Oral - Pill -- Oral Phase - Comment --  CHL IP  PHARYNGEAL PHASE 02/15/2018 Pharyngeal Phase WFL Pharyngeal- Pudding Teaspoon -- Pharyngeal -- Pharyngeal- Pudding Cup -- Pharyngeal -- Pharyngeal- Honey Teaspoon -- Pharyngeal -- Pharyngeal- Honey Cup -- Pharyngeal -- Pharyngeal- Nectar Teaspoon -- Pharyngeal -- Pharyngeal- Nectar Cup -- Pharyngeal -- Pharyngeal- Nectar Straw -- Pharyngeal -- Pharyngeal- Thin Teaspoon -- Pharyngeal -- Pharyngeal- Thin Cup -- Pharyngeal -- Pharyngeal- Thin Straw -- Pharyngeal -- Pharyngeal- Puree -- Pharyngeal -- Pharyngeal- Mechanical Soft -- Pharyngeal -- Pharyngeal- Regular -- Pharyngeal -- Pharyngeal- Multi-consistency -- Pharyngeal -- Pharyngeal- Pill -- Pharyngeal -- Pharyngeal Comment --  CHL IP CERVICAL ESOPHAGEAL PHASE 02/15/2018 Cervical Esophageal Phase Impaired Pudding Teaspoon -- Pudding Cup -- Honey Teaspoon -- Honey Cup -- Nectar Teaspoon -- Nectar Cup -- Nectar Straw -- Thin Teaspoon Reduced cricopharyngeal  relaxation;Esophageal backflow into the pharynx;Esophageal backflow into the larynx Thin Cup -- Thin Straw Reduced cricopharyngeal relaxation;Esophageal backflow into the pharynx;Esophageal backflow into the larynx Puree -- Mechanical Soft -- Regular -- Multi-consistency -- Pill -- Cervical Esophageal Comment -- No flowsheet data found. DeBlois, Katherene Ponto 02/15/2018, 10:41 AM                   Scheduled Meds: . iopamidol      . iopamidol      . lidocaine      . dexamethasone  4 mg Intravenous Q6H  . enoxaparin (LOVENOX) injection  1 mg/kg Subcutaneous Q12H  . metoprolol tartrate  5 mg Intravenous Q6H  .  morphine injection  1 mg Intravenous Once   Continuous Infusions: . ampicillin-sulbactam (UNASYN) IV    . lactated ringers 100 mL/hr at 02/15/18 0200     LOS: 0 days    Time spent: 74min    Domenic Polite, MD [facility] Page via [email or web address], password [email or web address] After 7PM please contact night-coverage  02/15/2018, 1:25 PM

## 2018-02-15 NOTE — Progress Notes (Addendum)
Initial Nutrition Assessment    DOCUMENTATION CODES:   Severe malnutrition in context of acute illness/injury  INTERVENTION:   Jevity 1.2 @ 31mL/hr x24 hours with 54mL Prostat BID. Advance 42mL every 12 hours to goal rate of 53mL/hr.  Monitor K+, Phos, Mg as pt is at high risk of refeeding syndrome given pt is on day 4 without nutrition.  Goal rate of 52mL/hr x24hrs with 47mL prostat BID provides 2072kcal (101% needs), 117g protein (102% of needs), and 1242mL free water.  NUTRITION DIAGNOSIS:   Severe Malnutrition related to acute illness(recent dysphagia) as evidenced by moderate muscle depletion, percent weight loss, energy intake < or equal to 50% for > or equal to 5 days(5.5% wt loss in one month).  GOAL:   Patient will meet greater than or equal to 90% of their needs  MONITOR:   Diet advancement, Labs, I & O's  REASON FOR ASSESSMENT:   Malnutrition Screening Tool    ASSESSMENT:   82 y.o. 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 S/p vocal cord injection in OR on 3/15. Admitted from ENT with concern for dehydration and dysphagia. Pt with difficulty swallowing.  SLP performed MBS this morning. Recommended pt remain NPO and concern for temporary nutrition.  IR able to place NGT.  Pt reports Saturday (3/16) he was only able to hold down chicken noodle soup and eggs. After Saturday he reports all food would "come back up." Pt denies nausea; Pt feels vomiting induced from food becoming stuck/physical blockage. Roughly on day 4 without nutrition.  Prior to Saturday, pt felt he was generally eating well with 3 meals per day.  He denies diarrhea/constipation however he feels he cannot move his bowels due to decreased intake.   Pt states a UBW of 215lb one month ago and feels he has lost 15lb within the last month.  Per chart review, pt has lost 5.5% body weight  since 2/27 which is severe for time frame.  Pt reports feeling generally weak. Observed pt's arm shaking while he lifted it for the physical examination.  Medications: Lactated ringers @ 114ml/hr.  Negative 265mL since admit.  Labs: BUN (H;23)  NUTRITION - FOCUSED PHYSICAL EXAM:    Most Recent Value  Orbital Region  Mild depletion  Upper Arm Region  No depletion  Thoracic and Lumbar Region  No depletion  Buccal Region  No depletion  Temple Region  No depletion  Clavicle Bone Region  No depletion  Clavicle and Acromion Bone Region  Moderate depletion  Scapular Bone Region  Moderate depletion  Dorsal Hand  No depletion  Patellar Region  Moderate depletion  Anterior Thigh Region  Moderate depletion  Posterior Calf Region  Mild depletion  Edema (RD Assessment)  Moderate  Hair  Reviewed  Eyes  Reviewed  Mouth  Reviewed  Skin  Reviewed  Nails  Reviewed       Diet Order:  Diet NPO time specified  EDUCATION NEEDS:   No education needs have been identified at this time  Skin:  Skin Assessment: Skin Integrity Issues: Skin Integrity Issues:: Incisions, Other (Comment) Incisions: lip Other: skin tear R leg  Last BM:  3/15  Height:   Ht Readings from Last 1 Encounters:  02/14/18 5\' 9"  (1.753 m)    Weight:   Wt Readings from Last 15 Encounters:  02/14/18 188 lb 0.8 oz (85.3 kg)  02/10/18 194 lb (88 kg)  01/25/18 199 lb (90.3 kg)  01/11/18 200 lb (90.7 kg)  12/06/17 203 lb (92.1 kg)  12/06/17 203 lb (92.1 kg)  09/12/17 212 lb (96.2 kg)  08/09/17 209 lb (94.8 kg)  05/12/17 211 lb (95.7 kg)  04/27/17 210 lb (95.3 kg)  03/30/17 220 lb (99.8 kg)  12/27/16 216 lb (98 kg)  09/24/16 215 lb (97.5 kg)  09/14/16 217 lb (98.4 kg)  09/10/16 217 lb (98.4 kg)    Ideal Body Weight:  72.7 kg  BMI:  Body mass index is 27.77 kg/m.  Estimated Nutritional Needs:   Kcal:  1850-2050  Protein:  105-115  Fluid:  >1.8L    John Bordas, MS, Dietetic Intern Pager  # [phone]

## 2018-02-15 NOTE — Progress Notes (Signed)
Cortrak Tube Team Note:  Received a call from radiology requesting bridle placement. Radiology placed A 10 F Cortrak tube in the left nare and this RD secured with a nasal bridle   Mariana Single RD, LDN Clinical Nutrition Pager # [phone]

## 2018-02-16 LAB — BASIC METABOLIC PANEL
ANION GAP: 10 (ref 5–15)
BUN: 23 mg/dL — ABNORMAL HIGH (ref 6–20)
CO2: 21 mmol/L — ABNORMAL LOW (ref 22–32)
Calcium: 9.2 mg/dL (ref 8.9–10.3)
Chloride: 107 mmol/L (ref 101–111)
Creatinine, Ser: 0.73 mg/dL (ref 0.61–1.24)
GLUCOSE: 143 mg/dL — AB (ref 65–99)
POTASSIUM: 3.5 mmol/L (ref 3.5–5.1)
SODIUM: 138 mmol/L (ref 135–145)

## 2018-02-16 LAB — GLUCOSE, CAPILLARY
GLUCOSE-CAPILLARY: 146 mg/dL — AB (ref 65–99)
GLUCOSE-CAPILLARY: 146 mg/dL — AB (ref 65–99)
Glucose-Capillary: 132 mg/dL — ABNORMAL HIGH (ref 65–99)
Glucose-Capillary: 146 mg/dL — ABNORMAL HIGH (ref 65–99)
Glucose-Capillary: 152 mg/dL — ABNORMAL HIGH (ref 65–99)
Glucose-Capillary: 157 mg/dL — ABNORMAL HIGH (ref 65–99)

## 2018-02-16 LAB — CBC
HEMATOCRIT: 37.6 % — AB (ref 39.0–52.0)
Hemoglobin: 11.5 g/dL — ABNORMAL LOW (ref 13.0–17.0)
MCH: 25.4 pg — AB (ref 26.0–34.0)
MCHC: 30.6 g/dL (ref 30.0–36.0)
MCV: 83 fL (ref 78.0–100.0)
Platelets: 312 10*3/uL (ref 150–400)
RBC: 4.53 MIL/uL (ref 4.22–5.81)
RDW: 19.8 % — ABNORMAL HIGH (ref 11.5–15.5)
WBC: 10.2 10*3/uL (ref 4.0–10.5)

## 2018-02-16 MED ORDER — HYDROMORPHONE HCL 1 MG/ML IJ SOLN
0.5000 mg | INTRAMUSCULAR | Status: DC | PRN
  Administered 2018-02-16 – 2018-03-02 (×30): 0.5 mg via INTRAVENOUS
  Filled 2018-02-16 (×31): qty 1

## 2018-02-16 NOTE — Progress Notes (Addendum)
PROGRESS NOTE    [name]  WJX:[id number] DOB: [birth year] DOA: 02/14/2018 PCP: Sinda Du, MD  Brief Narrative:John Sampson is a 82 y.o. 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  He presented to ENT with increasing SOB and hoarseness found to have VOcal cord paralysis.  He was taken to the OR on 3/15 by Dr. Lucia Gaskins and underwent prolaryn injection to vocal cords, then presented 3/19  to Dr. Pollie Friar office and there was concern for dehydration and dysphagia so he was sent to [facility] for direct admission.  Since the procedure, he has been able to have any solid or liquid food - it just won't go down or it goes down and comes right back up.  Also unable to swallow any pills.  Modified Barium swallow 3/20 with severe cervical esophageal swelling with resultant dysphagia with obstructing tissue at C5/6 and C7. Pt is unable to pass 1/4 teaspoon of thin liquids despite maximal effortful swallow  -3/20 started steroids and Abx, Cortrak placed for tube feeds  Assessment & Plan:   Severe Cervical esophageal swelling with dysphagia -s/p MBS today which notes Severe cervical esophageal swelling with resultant dysphagia with obstructing tissue at C5/6 and C7. Pt is unable to pass 1/4 teaspoon of thin liquids despite maximal effortful swallow  -Status post prolaryn injection to vocal cords 3/15 for vocal cord paralysis, with resultant profound dysphagia  -d/w ENt, Dr.Newman, clinically improving, continue IV decadron, Unasyn -expect this to improve in a few days, repeat MBS in 2-3days -Small Cortrak placed under fluoroscopy per IR, continue Tube feeds  P.Afib on Xarelto -in NSR, and HR controlled -hold Xarelto, continue full dose lovenox for now -PO metoprolol and Diltiazem on hold -continue IV lopressor in the interim  H/o Hypothyroidism -currently hyperthyroid by labs,  oversupressed -recheck levels and resume synthroid at very small dose in 4-6weeks  HTN -unable to tolerate PO, IV metoprolol and when necessary hydralazine for now  HLD -Hold Zocor   Chronic diastolic heart failure -clinically compensated, stop IVF  R leg abrasion/skin tear -no s/s of infection, will ask Woc for recs  DVT prophylaxis: Treatment-dose Lovenox Code Status:  DNR  Family Communication: . Family at bedside Disposition Plan:  home when improved, able to swallow safely likely 4-5 days at least  Consults  - ENT   Procedures:   Antimicrobials:    Subjective: Feels ok, tolerating tube feeds  Objective: Vitals:   02/16/18 0431 02/16/18 0800 02/16/18 0819 02/16/18 1348  BP: (!) 157/57 (!) 186/164  (!) 168/63  Pulse: 63 (!) 59  68  Resp: 17 (!) 22  (!) 26  Temp: 98 F (36.7 C) (!) 97.5 F (36.4 C)  98.2 F (36.8 C)  TempSrc: Axillary Axillary  Oral  SpO2: 97% 96% 98% 95%  Weight:      Height:        Intake/Output Summary (Last 24 hours) at 02/16/2018 1421 Last data filed at 02/16/2018 0431 Gross per 24 hour  Intake 986.25 ml  Output 925 ml  Net 61.25 ml   Filed Weights   02/14/18 1507 02/15/18 2009  Weight: 85.3 kg (188 lb 0.8 oz) 85.3 kg (188 lb 0.8 oz)    Examination:  Gen: frail elderly male, sitting in bed, no distress HEENT: PERRLA, Neck supple, no JVD Lungs: CTAB CVS: RRR,No Gallops,Rubs or new Murmurs Abd: soft, Non tender, non distended, BS present Extremities:  No Cyanosis, Clubbing or edema Skin: no new rashes Psychiatry: Judgement and insight appear normal. Mood & affect appropriate.     Data Reviewed:   CBC: Recent Labs  Lab 02/14/18 1601 02/15/18 0552 02/16/18 0801  WBC 9.2 9.3 10.2  NEUTROABS 7.1  --   --   HGB 11.2* 11.1* 11.5*  HCT 36.5* 35.8* 37.6*  MCV 83.7 84.6 83.0  PLT 285 281 301   Basic Metabolic Panel: Recent Labs  Lab 02/14/18 1601 02/15/18 0552 02/16/18 0938  NA 140 140 138  K 3.4* 4.2 3.5  CL  105 106 107  CO2 24 22 21*  GLUCOSE 100* 78 143*  BUN 25* 23* 23*  CREATININE 0.98 0.81 0.73  CALCIUM 9.3 8.9 9.2   GFR: Estimated Creatinine Clearance: 79.7 mL/min (by C-G formula based on SCr of 0.73 mg/dL). Liver Function Tests: Recent Labs  Lab 02/14/18 1601  AST 30  ALT 22  ALKPHOS 62  BILITOT 1.3*  PROT 6.7  ALBUMIN 3.6   No results for input(s): LIPASE, AMYLASE in the last 168 hours. No results for input(s): AMMONIA in the last 168 hours. Coagulation Profile: No results for input(s): INR, PROTIME in the last 168 hours. Cardiac Enzymes: No results for input(s): CKTOTAL, CKMB, CKMBINDEX, TROPONINI in the last 168 hours. BNP (last 3 results) No results for input(s): PROBNP in the last 8760 hours. HbA1C: No results for input(s): HGBA1C in the last 72 hours. CBG: Recent Labs  Lab 02/16/18 0007 02/16/18 0425 02/16/18 0750 02/16/18 1158  GLUCAP 132* 152* 146* 146*   Lipid Profile: No results for input(s): CHOL, HDL, LDLCALC, TRIG, CHOLHDL, LDLDIRECT in the last 72 hours. Thyroid Function Tests: Recent Labs    02/14/18 1601  TSH 0.024*  FREET4 1.21*   Anemia Panel: No results for input(s): VITAMINB12, FOLATE, FERRITIN, TIBC, IRON, RETICCTPCT in the last 72 hours. Urine analysis:    Component Value Date/Time   COLORURINE YELLOW 12/06/2017 1215   APPEARANCEUR HAZY (A) 12/06/2017 1215   LABSPEC 1.021 12/06/2017 1215   PHURINE 5.0 12/06/2017 1215   GLUCOSEU NEGATIVE 12/06/2017 1215   HGBUR LARGE (A) 12/06/2017 1215   BILIRUBINUR NEGATIVE 12/06/2017 1215   KETONESUR NEGATIVE 12/06/2017 1215   PROTEINUR 30 (A) 12/06/2017 1215   NITRITE NEGATIVE 12/06/2017 1215   LEUKOCYTESUR TRACE (A) 12/06/2017 1215   Sepsis Labs: @LABRCNTIP (procalcitonin:4,lacticidven:4)  )No results found for this or any previous visit (from the past 240 hour(s)).       Radiology Studies: Dg Loyce Dys Tube Plc W/fl W/rad  Result Date: 02/15/2018 CLINICAL DATA:  Unable to swallow.   Need for feeding tube EXAM: NASO G TUBE PLACEMENT WITH FL AND WITH RAD CONTRAST:  10 mL Isovue contrast injected to the feeding tube. FLUOROSCOPY TIME:  Fluoroscopy Time:  8 minutes 4 seconds Radiation Exposure Index (if provided by the fluoroscopic device): Number of Acquired Spot Images: 0 COMPARISON:  Speech pathology study 02/15/2018 FINDINGS: The technologist initially attempted to place the tube, and had difficulty passing the tube through the pharynx into the esophagus. My presence was requested. After some effort, the tube was advanced into the stomach using a Glidewire. The tube was placed in the antrum however I was not able to get the tube into the duodenum despite multiple attempts with the tube and wire. Tube was left in the gastric antrum IMPRESSION: Feeding tube placed with the tip in the gastric antrum. I was not able to get the tube into the duodenum. Electronically Signed  By: Franchot Gallo M.D.   On: 02/15/2018 14:00   Dg Swallowing Func-speech Pathology  Result Date: 02/15/2018 Objective Swallowing Evaluation: Type of Study: MBS-Modified Barium Swallow Study  Patient Details Name: John Sampson MRN: [id number] Date of Birth: May 05, 1936 Today's Date: 02/15/2018 Time: SLP Start Time (ACUTE ONLY): 1000 -SLP Stop Time (ACUTE ONLY): 1027 SLP Time Calculation (min) (ACUTE ONLY): 27 min Past Medical History: Past Medical History: Diagnosis Date . Atrial flutter ([place])   on Eliquis . AV malformation of GI tract  . COPD (chronic obstructive pulmonary disease) ([place]) 3.12.14  2D Echo, EF 50-55% . Coronary atherosclerosis of native coronary artery   Multivessel status post CABG . Gastric ulcer   Small - nonbleeding . GERD (gastroesophageal reflux disease)  . Headache(784.0)  . Hypothyroidism  . Iron deficiency anemia   Negative Givens capsule study  . Myocardial infarction ([place])  . PAD (peripheral artery disease) ([facility])   Moderate bilateral SFA disease at angiography 01/2013 . Pituitary macroadenoma ([place])  .  Thyroid cancer ([place])  . Vocal cord paralysis   left Past Surgical History: Past Surgical History: Procedure Laterality Date . ABDOMINAL AORTAGRAM N/A 02/19/2013  Procedure: ABDOMINAL Maxcine Ham;  Surgeon: [name], MD;  Location: [facility] CATH LAB;  Service: Cardiovascular;  Laterality: N/A; . CARDIAC CATHETERIZATION   . CARDIOVERSION N/A 06/16/2015  Procedure: CARDIOVERSION;  Surgeon: Satira Sark, MD;  Location: [facility] ORS;  Service: Cardiovascular;  Laterality: N/A; . COLONOSCOPY  08/18/2012  Procedure: COLONOSCOPY;  Surgeon: [name], MD;  [facility]: [facility] ENDO SU[facility];  Service: Endoscopy;  Laterality: N/A;  1:25 . COLONOSCOPY N/A 09/12/2017  Procedure: COLONOSCOPY;  Surgeon: [name], MD;  [facility];  Service: Endoscopy;  Laterality: N/A; . CORONARY ARTERY BYPASS GRAFT  2003  5 grafts - details not clear . CRANIOTOMY  12/31/2011  Procedure: CRANIOTOMY HYPOPHYSECTOMY TRANSNASAL APPROACH;  Surgeon: [name], MD;  Location: [place] NEURO ORS;  Service: Neurosurgery;  Laterality: N/A;  Transphenoidal Hypophysectomy With Fat Graft Harvest from right abdomen  . ESOPHAGOGASTRODUODENOSCOPY  08/18/2012  Procedure: ESOPHAGOGASTRODUODENOSCOPY (EGD);  Surgeon: [name], MD;  [facility];  Service: Endoscopy;  Laterality: N/A; . ESOPHAGOGASTRODUODENOSCOPY N/A 03/31/2017  Procedure: ESOPHAGOGASTRODUODENOSCOPY (EGD);  Surgeon: [name], MD;  [facility];  Service: Endoscopy;  Laterality: N/A; . ESOPHAGOGASTRODUODENOSCOPY N/A 09/12/2017  Procedure: ESOPHAGOGASTRODUODENOSCOPY (EGD);  Surgeon: [name], MD;  [facility];  Service: Endoscopy;  Laterality: N/A;  10:40 . EYE SURGERY  2012 . GIVENS CAPSULE STUDY N/A 01/25/2013  Procedure: GIVENS CAPSULE STUDY;  Surgeon: [name], MD;  [facility];  Service: Endoscopy;  Laterality: N/A;  730 . HOT HEMOSTASIS  03/31/2017  Procedure: HOT HEMOSTASIS (ARGON PLASMA COAGULATION/BICAP);  Surgeon:  [name], MD;  [facility];  Service: Endoscopy;;  duodenum . LARYNGOPLASTY Left 09/14/2016  Procedure: LARYNGOPLASTY;  Surgeon: [name], MD;  Location: [place];  Service: ENT;  Laterality: Left; . LOWER EXTREMITY ANGIOGRAM N/A 02/19/2013  Procedure: LOWER EXTREMITY ANGIOGRAM;  Surgeon: [name], MD;  Location: [facility] CATH LAB;  Service: Cardiovascular;  Laterality: N/A; . MICROLARYNGOSCOPY N/A 02/10/2018  Procedure: MICROLARYNGOSCOPY WITH VOCAL CORD INJECTION;  Surgeon: [name], MD;  Location: [place];  Service: ENT;  Laterality: N/A; . PERIPHERAL VASCULAR CATHETERIZATION N/A 01/19/2016  Procedure: Abdominal Aortogram;  Surgeon: Conrad [place], MD;  Location: [place] CV LAB;  Service: Cardiovascular;  Laterality: N/A; .  PERIPHERAL VASCULAR CATHETERIZATION Bilateral 01/19/2016  Procedure: Lower Extremity Angiography;  Surgeon: Conrad [place], MD;  Location: [place] CV LAB;  Service: Cardiovascular;  Laterality: Bilateral; . PV Angiogram  02/19/13  Indications: slow healing left calf ulcer . Stress Myocardial Perfusion  12/08/2011  Indications: Abnormal EKG, Eval of prior GABG . TEE WITHOUT CARDIOVERSION N/A 06/16/2015  Procedure: TRANSESOPHAGEAL ECHOCARDIOGRAM (TEE);  Surgeon: Satira Sark, MD;  Location[facility];  Service: Cardiovascular;  Laterality: N/A; . THYROIDECTOMY N/A 09/14/2016  Procedure: TOTAL THYROIDECTOMY;  Surgeon: [name], MD;  Location: [place];  Service: ENT;  Laterality: N/A; . TONSILLECTOMY  Age 67 HPI: [name] a 82 y.o.malewith medical history significant ofleft papillary thyroid carcinoma with paralysis of left vocal cord. Also with total thyroidectomy and laryngoplasty. He presented to [facility] increasing SOB and hoarseness. He was taken to the OR on 3/15 by Dr. Lucia Gaskins for injection of left vocal cord to strengthen his voice. He presented today to Dr. Pollie Friar office and there was concern for  dehydration and dysphagia so he was sent to [facility] for direct admission (actually observation). Since the procedure, he has been able to have any solid or liquid food - it just won't go down or it goes down and comes right back up. Also unable to swallow any pills. He had the procedure Friday and did fine. He was told to go home with a soft diet and he was able to eat and drink that night. He ate breakfast ok Saturday but he wasn't able to eat after that. Dr. Shirlee More that on in-office exam the vocal cords are clear without swelling or infection but the patient was unable to swallow water in his office. He recommends IVF, labs, modified barium swallow evaluationfor esophageal obstruction/aspiration.  No Data Recorded Assessment / Plan / Recommendation CHL IP CLINICAL IMPRESSIONS 02/15/2018 Clinical Impression Pt demosntrates a severe cervical esophageal dysphagia with obstructing tissue at C5/6 and C7. Pt is unable to pass 1/4 teaspoon of thin liquids despite maximal effortful swallow or a head turn R or L. Chin tuck also ineffective. Attempted large bolus to adress if heavier propulsion could force more opening without success. 90% or more of the bolus returned to the pharynx and was aspriated with sensation. Pt does have a hard cough ability and can expectorate residue. Recommend pt remain fully NPO. Discussed result with MD, will need ENT to review study images and address needs. Pt may need a temporary means of nutrition, though passage of any NG would likely be difficult even under fluoroscopy and could be contraindicated. Esophagram discontinued due to pt inability to consume any quantity of barium for further esophageal assessment.  SLP Visit Diagnosis Dysphagia, pharyngoesophageal phase (R13.14) Attention and concentration deficit following -- Frontal lobe and executive function deficit following -- Impact on safety and function Severe aspiration risk;Risk for inadequate nutrition/hydration   CHL IP  TREATMENT RECOMMENDATION 02/15/2018 Treatment Recommendations Therapy as outlined in treatment plan below   Prognosis 02/15/2018 Prognosis for Safe Diet Advancement Good Barriers to Reach Goals -- Barriers/Prognosis Comment -- CHL IP DIET RECOMMENDATION 02/15/2018 SLP Diet Recommendations NPO;Alternative means - temporary Liquid Administration via -- Medication Administration Via alternative means Compensations -- Postural Changes --   CHL IP OTHER RECOMMENDATIONS 02/15/2018 Recommended Consults Consider ENT evaluation Oral Care Recommendations -- Other Recommendations --   CHL IP FOLLOW UP RECOMMENDATIONS 02/15/2018 Follow up Recommendations 24 hour supervision/assistance   CHL IP FREQUENCY AND DURATION 02/15/2018 Speech Therapy Frequency (ACUTE ONLY) min 2x/week  Treatment Duration 2 weeks      CHL IP ORAL PHASE 02/15/2018 Oral Phase WFL Oral - Pudding Teaspoon -- Oral - Pudding Cup -- Oral - Honey Teaspoon -- Oral - Honey Cup -- Oral - Nectar Teaspoon -- Oral - Nectar Cup -- Oral - Nectar Straw -- Oral - Thin Teaspoon -- Oral - Thin Cup -- Oral - Thin Straw -- Oral - Puree -- Oral - Mech Soft -- Oral - Regular -- Oral - Multi-Consistency -- Oral - Pill -- Oral Phase - Comment --  CHL IP PHARYNGEAL PHASE 02/15/2018 Pharyngeal Phase WFL Pharyngeal- Pudding Teaspoon -- Pharyngeal -- Pharyngeal- Pudding Cup -- Pharyngeal -- Pharyngeal- Honey Teaspoon -- Pharyngeal -- Pharyngeal- Honey Cup -- Pharyngeal -- Pharyngeal- Nectar Teaspoon -- Pharyngeal -- Pharyngeal- Nectar Cup -- Pharyngeal -- Pharyngeal- Nectar Straw -- Pharyngeal -- Pharyngeal- Thin Teaspoon -- Pharyngeal -- Pharyngeal- Thin Cup -- Pharyngeal -- Pharyngeal- Thin Straw -- Pharyngeal -- Pharyngeal- Puree -- Pharyngeal -- Pharyngeal- Mechanical Soft -- Pharyngeal -- Pharyngeal- Regular -- Pharyngeal -- Pharyngeal- Multi-consistency -- Pharyngeal -- Pharyngeal- Pill -- Pharyngeal -- Pharyngeal Comment --  CHL IP CERVICAL ESOPHAGEAL PHASE 02/15/2018 Cervical  Esophageal Phase Impaired Pudding Teaspoon -- Pudding Cup -- Honey Teaspoon -- Honey Cup -- Nectar Teaspoon -- Nectar Cup -- Nectar Straw -- Thin Teaspoon Reduced cricopharyngeal relaxation;Esophageal backflow into the pharynx;Esophageal backflow into the larynx Thin Cup -- Thin Straw Reduced cricopharyngeal relaxation;Esophageal backflow into the pharynx;Esophageal backflow into the larynx Puree -- Mechanical Soft -- Regular -- Multi-consistency -- Pill -- Cervical Esophageal Comment -- No flowsheet data found. DeBlois, Katherene Ponto 02/15/2018, 10:41 AM                   Scheduled Meds: . dexamethasone  4 mg Intravenous Q6H  . enoxaparin (LOVENOX) injection  1 mg/kg Subcutaneous Q12H  . feeding supplement (PRO-STAT SUGAR FREE 64)  30 mL Per Tube BID  . mouth rinse  15 mL Mouth Rinse BID  . metoprolol tartrate  5 mg Intravenous Q6H  .  morphine injection  1 mg Intravenous Once   Continuous Infusions: . ampicillin-sulbactam (UNASYN) IV Stopped (02/16/18 1401)  . feeding supplement (JEVITY 1.2 CAL) 35 mL/hr (02/15/18 2052)     LOS: 1 day    Time spent: 53min    Domenic Polite, MD [facility] Page via [email or web address], password [email or web address] After 7PM please contact night-coverage  02/16/2018, 2:21 PM

## 2018-02-16 NOTE — Progress Notes (Signed)
Patient with history of papillary thyroid cancer with left VC paralysis s/p thryoidectomy 2 yrs ago. He underwent left VC injection with hydroxyapatite suspension on 7/15. 2 days following the procedure he noted difficulty swallowing but no fever or pain. He was subsequently admitted to [facility] for further treatment and evaluation. Modified Ba swallow demonstrated swelling and obstruction of the upper cervical esophagus. NG tube was placed and started on tube feedings. Patient given abx and steroids. Plan to recheck modified Ba Swallow in 2-3 days. If patient still with problems would recommend CT scan of neck with contrast to better evaluate obstructing cause (inflammation,mass, tumor,stricture,etc) I will plan to follow up Patient presently unable to swallow water without gross aspiration at bedside but he's feeling better and AF.  WBC 10k Radene Journey    [phone]

## 2018-02-16 NOTE — Progress Notes (Signed)
Checked in with pt at bedside. He verbalizes subjective improvement when Dr. Lucia Gaskins offered an ice chip though Dr. Lucia Gaskins reports signs of aspiration. Will check back tomorrow for potential readiness for repeat MBS depending on success with PO trials. Encouraged independent oral care including removal of denture and brushing oral cavity, not just swabbing. Will have SLP f/u for needs tomorrow.  John Baltimore, MA CCC-SLP

## 2018-02-16 NOTE — Progress Notes (Signed)
Patient resting in bed comfortably. Patient has no complaints at this time. Tolerating tube feeding well. Increased tube feeding by 64ml/hr to 69ml/hr (rate was 69ml/hr) as instructed in administration instructions.

## 2018-02-17 DIAGNOSIS — E43 Unspecified severe protein-calorie malnutrition: Secondary | ICD-10-CM

## 2018-02-17 LAB — GLUCOSE, CAPILLARY
GLUCOSE-CAPILLARY: 158 mg/dL — AB (ref 65–99)
GLUCOSE-CAPILLARY: 161 mg/dL — AB (ref 65–99)
Glucose-Capillary: 166 mg/dL — ABNORMAL HIGH (ref 65–99)
Glucose-Capillary: 154 mg/dL — ABNORMAL HIGH (ref 65–99)
Glucose-Capillary: 159 mg/dL — ABNORMAL HIGH (ref 65–99)
Glucose-Capillary: 144 mg/dL — ABNORMAL HIGH (ref 65–99)

## 2018-02-17 MED ORDER — FREE WATER
200.0000 mL | Freq: Four times a day (QID) | Status: DC
  Administered 2018-02-17 – 2018-03-02 (×48): 200 mL

## 2018-02-17 MED ORDER — MUPIROCIN 2 % EX OINT
TOPICAL_OINTMENT | Freq: Every day | CUTANEOUS | Status: AC
  Administered 2018-02-17 – 2018-03-02 (×14): via TOPICAL
  Filled 2018-02-17 (×4): qty 22

## 2018-02-17 MED ORDER — ALBUTEROL SULFATE (2.5 MG/3ML) 0.083% IN NEBU
INHALATION_SOLUTION | RESPIRATORY_TRACT | Status: AC
  Filled 2018-02-17: qty 3

## 2018-02-17 NOTE — Progress Notes (Signed)
Nutrition Follow-up  DOCUMENTATION CODES:   Severe malnutrition in context of acute illness/injury  INTERVENTION:   Recommend adding 262mL free water flushes QID via cortrak to meet pt's fluid needs.  Continue Jevity @ 30mL/hr x24hrs with 42mL prostat BID  provides 2072kcal (101% needs), 117g protein (102% of needs), and 1249mL free water.  NUTRITION DIAGNOSIS:   Severe Malnutrition related to acute illness(recent dysphagia) as evidenced by moderate muscle depletion, percent weight loss, energy intake < or equal to 50% for > or equal to 5 days(5.5% wt loss in one month). Ongoing.  GOAL:   Patient will meet greater than or equal to 90% of their needs. Now meeting with TF at goal.   MONITOR:   Diet advancement, Labs, I & O's  REASON FOR ASSESSMENT:   Malnutrition Screening Tool, Consult Enteral/tube feeding initiation and management  ASSESSMENT:   82 y.o. 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 S/p vocal cord injection in OR on 3/15. Admitted from ENT with concern for dehydration and dysphagia. Pt with difficulty swallowing.  3/20 IR placed Cortrak; TF initiated - Jevity 1.2 @ 14mL/hr x24 hours with 3mL prostat BID.  advanced 69mL every 12hours for refeeding risk to goal of 80ml/hr.  Pt discussed with RN; TF reached goal this morning. Pt tolerating well without N/V/D. Bowel movement yesterday.   Pt reports no abdominal pain or distention. Pt reports less generalized weakness since day of admission.  SLP following pt for readiness to repeat MBS; Recommends pt remain NPO today.  Pt weight up 6lb since admit. Consistent with positive fluid status. Net positive 2.2L since admit  Medications: Dexamethasone, unasyn,  Lactated ringers d/c 3/21  Labs: BUN (H;23)  CBG (last 3)  Recent Labs    02/17/18 0438 02/17/18 0826 02/17/18 1152  GLUCAP 166* 158*  154*    Diet Order:  Diet NPO time specified  EDUCATION NEEDS:   No education needs have been identified at this time  Skin:  Skin Assessment: Skin Integrity Issues: Skin Integrity Issues:: Incisions, Other (Comment) Incisions: lip Other: skin tear R leg  Last BM:  3/15  Height:   Ht Readings from Last 1 Encounters:  02/14/18 5\' 9"  (1.753 m)    Weight:   Wt Readings from Last 1 Encounters:  02/17/18 194 lb 7.1 oz (88.2 kg)    Ideal Body Weight:  72.7 kg  BMI:  Body mass index is 28.71 kg/m.  Estimated Nutritional Needs:   Kcal:  1850-2050  Protein:  105-115  Fluid:  >1.8L   Grahm Etsitty, MS, Dietetic Intern Pager # [phone]

## 2018-02-17 NOTE — Progress Notes (Signed)
PROGRESS NOTE    John Sampson  CBS:[id number] DOB: 1935/12/21 DOA: 02/14/2018 PCP: Sinda Du, MD  Brief Narrative:John Sampson is a 82 y.o. 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  He presented to ENT with increasing SOB and hoarseness found to have VOcal cord paralysis.  He was taken to the OR on 3/15 by Dr. Lucia Gaskins and underwent prolaryn injection to vocal cords, then presented 3/19  to Dr. Pollie Friar office and there was concern for dehydration and dysphagia so he was sent to [facility] for direct admission.  Since the procedure, he has been able to have any solid or liquid food - it just won't go down or it goes down and comes right back up.  Also unable to swallow any pills.  Modified Barium swallow 3/20 with severe cervical esophageal swelling with resultant dysphagia with obstructing tissue at C5/6 and C7. Pt is unable to pass 1/4 teaspoon of thin liquids despite maximal effortful swallow  -3/20 started steroids and Abx, Cortrak placed for tube feeds  Assessment & Plan:   Severe Cervical esophageal swelling with dysphagia -s/p MBS which noted Severe cervical esophageal swelling with resultant dysphagia with obstructing tissue at C5/6 and C7. Pt was unable to pass 1/4 teaspoon of thin liquids despite maximal effortful swallow  -Status post prolaryn injection to vocal cords 3/15 for vocal cord paralysis, with resultant profound dysphagia  -d/w ENt, Dr.Newman, clinically improving, continue IV decadron, Unasyn -hopeful for improvement, plan for repeat MBS on Sunday 3/24 -Small Cortrak placed under fluoroscopy per IR, continue Tube feeds  P.Afib on Xarelto -in NSR, and HR controlled -hold Xarelto, continue full dose lovenox for now -PO metoprolol and Diltiazem on hold -continue IV lopressor in the interim  H/o Hypothyroidism -currently hyperthyroid by labs,  oversupressed -recheck levels and resume synthroid at very small dose in 4-6weeks  HTN -unable to tolerate PO, IV metoprolol and when necessary hydralazine for now  HLD -Hold Zocor   Chronic diastolic heart failure -clinically compensated, stop IVF  R leg abrasion/skin tear -no s/s of infection, will ask Woc for recs  DVT prophylaxis: Treatment-dose Lovenox Code Status:  DNR  Family Communication: . Family at bedside Disposition Plan:  home when improved, able to swallow safely likely 4 days at least  Consults  - ENT   Procedures:   Antimicrobials:    Subjective: Feels ok, tolerating tube feeds -still unable to swallow safely  Objective: Vitals:   02/17/18 0426 02/17/18 0432 02/17/18 0736 02/17/18 1334  BP: (!) 185/93  (!) 176/76 (!) 162/71  Pulse: 67  (!) 57 (!) 48  Resp: (!) 24  (!) 22 (!) 26  Temp: (!) 97.5 F (36.4 C)  (!) 97.5 F (36.4 C) 98.9 F (37.2 C)  TempSrc: Oral  Oral Oral  SpO2: 96%  93% 95%  Weight:  88.2 kg (194 lb 7.1 oz)    Height:        Intake/Output Summary (Last 24 hours) at 02/17/2018 1350 Last data filed at 02/17/2018 1342 Gross per 24 hour  Intake 1400.42 ml  Output 1025 ml  Net 375.42 ml   Filed Weights   02/15/18 2009 02/16/18 2008 02/17/18 0432  Weight: 85.3 kg (188 lb 0.8 oz) 87.9 kg (193 lb 12.6 oz) 88.2 kg (194 lb 7.1 oz)    Examination:  Gen: chronically ill, elderly male, sitting in bed, no distress HEENT: no JVD, NGT  noted Lungs: Good air movement bilaterally, CTAB CVS: S1-S2/regular rate rhythm Abd: soft, Non tender, non distended, BS present Extremities: No Cyanosis, Clubbing or edema Skin: no new rashes Psychiatry: Judgement and insight appear normal. Mood & affect appropriate.     Data Reviewed:   CBC: Recent Labs  Lab 02/14/18 1601 02/15/18 0552 02/16/18 0801  WBC 9.2 9.3 10.2  NEUTROABS 7.1  --   --   HGB 11.2* 11.1* 11.5*  HCT 36.5* 35.8* 37.6*  MCV 83.7 84.6 83.0  PLT 285 281 008    Basic Metabolic Panel: Recent Labs  Lab 02/14/18 1601 02/15/18 0552 02/16/18 0938  NA 140 140 138  K 3.4* 4.2 3.5  CL 105 106 107  CO2 24 22 21*  GLUCOSE 100* 78 143*  BUN 25* 23* 23*  CREATININE 0.98 0.81 0.73  CALCIUM 9.3 8.9 9.2   GFR: Estimated Creatinine Clearance: 80.9 mL/min (by C-G formula based on SCr of 0.73 mg/dL). Liver Function Tests: Recent Labs  Lab 02/14/18 1601  AST 30  ALT 22  ALKPHOS 62  BILITOT 1.3*  PROT 6.7  ALBUMIN 3.6   No results for input(s): LIPASE, AMYLASE in the last 168 hours. No results for input(s): AMMONIA in the last 168 hours. Coagulation Profile: No results for input(s): INR, PROTIME in the last 168 hours. Cardiac Enzymes: No results for input(s): CKTOTAL, CKMB, CKMBINDEX, TROPONINI in the last 168 hours. BNP (last 3 results) No results for input(s): PROBNP in the last 8760 hours. HbA1C: No results for input(s): HGBA1C in the last 72 hours. CBG: Recent Labs  Lab 02/16/18 2007 02/17/18 0006 02/17/18 0438 02/17/18 0826 02/17/18 1152  GLUCAP 146* 161* 166* 158* 154*   Lipid Profile: No results for input(s): CHOL, HDL, LDLCALC, TRIG, CHOLHDL, LDLDIRECT in the last 72 hours. Thyroid Function Tests: Recent Labs    02/14/18 1601  TSH 0.024*  FREET4 1.21*   Anemia Panel: No results for input(s): VITAMINB12, FOLATE, FERRITIN, TIBC, IRON, RETICCTPCT in the last 72 hours. Urine analysis:    Component Value Date/Time   COLORURINE YELLOW 12/06/2017 1215   APPEARANCEUR HAZY (A) 12/06/2017 1215   LABSPEC 1.021 12/06/2017 1215   PHURINE 5.0 12/06/2017 1215   GLUCOSEU NEGATIVE 12/06/2017 1215   HGBUR LARGE (A) 12/06/2017 1215   BILIRUBINUR NEGATIVE 12/06/2017 1215   KETONESUR NEGATIVE 12/06/2017 1215   PROTEINUR 30 (A) 12/06/2017 1215   NITRITE NEGATIVE 12/06/2017 1215   LEUKOCYTESUR TRACE (A) 12/06/2017 1215   Sepsis Labs: @LABRCNTIP (procalcitonin:4,lacticidven:4)  )No results found for this or any previous visit  (from the past 240 hour(s)).       Radiology Studies: Dg Loyce Dys Tube Plc W/fl W/rad  Result Date: 02/15/2018 CLINICAL DATA:  Unable to swallow.  Need for feeding tube EXAM: NASO G TUBE PLACEMENT WITH FL AND WITH RAD CONTRAST:  10 mL Isovue contrast injected to the feeding tube. FLUOROSCOPY TIME:  Fluoroscopy Time:  8 minutes 4 seconds Radiation Exposure Index (if provided by the fluoroscopic device): Number of Acquired Spot Images: 0 COMPARISON:  Speech pathology study 02/15/2018 FINDINGS: The technologist initially attempted to place the tube, and had difficulty passing the tube through the pharynx into the esophagus. My presence was requested. After some effort, the tube was advanced into the stomach using a Glidewire. The tube was placed in the antrum however I was not able to get the tube into the duodenum despite multiple attempts with the tube and wire. Tube was left in the gastric antrum IMPRESSION: Feeding tube  placed with the tip in the gastric antrum. I was not able to get the tube into the duodenum. Electronically Signed   By: Franchot Gallo M.D.   On: 02/15/2018 14:00        Scheduled Meds: . albuterol      . dexamethasone  4 mg Intravenous Q6H  . enoxaparin (LOVENOX) injection  1 mg/kg Subcutaneous Q12H  . feeding supplement (PRO-STAT SUGAR FREE 64)  30 mL Per Tube BID  . mouth rinse  15 mL Mouth Rinse BID  . metoprolol tartrate  5 mg Intravenous Q6H  .  morphine injection  1 mg Intravenous Once  . mupirocin ointment   Topical Daily   Continuous Infusions: . ampicillin-sulbactam (UNASYN) IV 1.5 g (02/17/18 1325)  . feeding supplement (JEVITY 1.2 CAL) 1,000 mL (02/17/18 1325)     LOS: 2 days    Time spent: 67min    Domenic Polite, MD [facility] Page via [email or web address], password [email or web address] After 7PM please contact night-coverage  02/17/2018, 1:50 PM

## 2018-02-17 NOTE — Consult Note (Signed)
[place] Nurse wound consult note Reason for Consult:Trauma wound to left anterior lower leg.  Scabbed lesion to right anterior lower leg.  States he bumped them getting into his car.  States he has historically gone to [place] wound center. Wound type:Trauma Pressure Injury POA: NA Measurement:LEft anterior lower leg, skin tear with skin defect noted.  4 cm x 3.4 cm x 0.2 cm  Ruddy red wound bed Right anterior lower leg:  0.5 cm diameter scabbed lesion Wound bed:see above Drainage (amount, consistency, odor) Minimal serosanguinous  No odor Periwound:Erythema  Tender to touch Dressing procedure/placement/frequency:Cleanse wounds to left and right lower leg with NS.  Apply mupirocin ointment daily.  Cover with 4x4 gauze and kerlix/tape.  Will not follow at this time.  Please re-consult if needed.  Domenic Moras RN BSN [place] Pager [phone]

## 2018-02-17 NOTE — Progress Notes (Signed)
  Speech Language Pathology Treatment: Dysphagia  Patient Details Name: John Sampson MRN: [id number] DOB: [birth year] Today's Date: 02/17/2018 Time: 0920-0930 SLP Time Calculation (min) (ACUTE ONLY): 10 min  Assessment / Plan / Recommendation Clinical Impression  Pt continues to present with clinical signs of aspiration with thin liquids, producing explosive coughing.  Some difficulty managing secretions.  Is tolerating TF per notes.  Reiterated importance of oral care. SLP will f/u daily to assess readiness for repeat MBS.  D/W pt, Dr. Broadus John.   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 Also with total thyroidectomy and laryngoplasty. He presented to [facility] increasing SOB and hoarseness. He was taken to the OR on 3/15 by Dr. Lucia Gaskins for injection of left vocal cord to strengthen his voice. He presented today to Dr. Pollie Friar office and there was concern for dehydration and dysphagia so he was sent to [facility] for direct admission (actually observation). Since the procedure, he has been able to have any solid or liquid food - it just won't go down or it goes down and comes right back up. Also unable to swallow any pills. He had the procedure Friday and did fine. He was told to go home with a soft diet and he was able to eat and drink that night. He ate breakfast ok Saturday but he wasn't able to eat after that. Dr. Shirlee More that on in-office exam the vocal cords are clear without swelling or infection but the patient was unable to swallow water in his office. He recommends IVF, labs, modified barium swallow evaluationfor esophageal obstruction/aspiration.      SLP Plan  Continue with current plan of care       Recommendations  Diet recommendations: NPO Medication Administration: Via alternative means                Oral Care Recommendations: Oral care QID SLP Visit  Diagnosis: Dysphagia, pharyngoesophageal phase (R13.14) Plan: Continue with current plan of care       GO                Juan Quam Laurice 02/17/2018, 9:35 AM

## 2018-02-17 NOTE — Progress Notes (Signed)
ANTICOAGULATION CONSULT NOTE  Pharmacy Consult:  Lovenox Indication: atrial fibrillation  Allergies  Allergen Reactions  . Morphine And Related Nausea And Vomiting    Patient Measurements: Height: 5\' 9"  (175.3 cm) Weight: 194 lb 7.1 oz (88.2 kg) IBW/kg (Calculated) : 70.7   Vital Signs: Temp: 97.5 F (36.4 C) (03/22 0736) Temp Source: Oral (03/22 0736) BP: 176/76 (03/22 0736) Pulse Rate: 57 (03/22 0736)  Labs: Recent Labs    02/14/18 1601 02/15/18 0552 02/16/18 0801 02/16/18 0938  HGB 11.2* 11.1* 11.5*  --   HCT 36.5* 35.8* 37.6*  --   PLT 285 281 312  --   CREATININE 0.98 0.81  --  0.73    Estimated Creatinine Clearance: 80.9 mL/min (by C-G formula based on SCr of 0.73 mg/dL).    Assessment: 60 YOM with history of L papillary thyroid carcinoma presenting with dehydration and dysphagia.  Pharmacy consulted to transition patient from home Eliquis to Lovenox for history of Afib.  Patient's renal function has been stable and no bleeding reported.  Patient has feeding tube and is approaching goal tube feeding rate.   Goal of Therapy:  Monitor platelets by anticoagulation protocol: Yes     Plan:  Continue Lovenox 85mg  SQ Q12H CBC Q72H while on Lovenox F/U with transitioning back to Eliquis, restart home meds, change meds to per tube, abx LOT   Aron Inge D. Mina Marble, PharmD, BCPS Pager:  [phone] 02/17/2018, 11:05 AM

## 2018-02-18 ENCOUNTER — Inpatient Hospital Stay (HOSPITAL_COMMUNITY): Payer: Medicare Other

## 2018-02-18 LAB — BASIC METABOLIC PANEL
Anion gap: 11 (ref 5–15)
BUN: 28 mg/dL — AB (ref 6–20)
CO2: 26 mmol/L (ref 22–32)
CREATININE: 0.78 mg/dL (ref 0.61–1.24)
Calcium: 9.1 mg/dL (ref 8.9–10.3)
Chloride: 100 mmol/L — ABNORMAL LOW (ref 101–111)
GFR calc Af Amer: >60 mL/min (ref >60)
Glucose, Bld: 130 mg/dL — ABNORMAL HIGH (ref 65–99)
Potassium: 4.3 mmol/L (ref 3.5–5.1)
SODIUM: 137 mmol/L (ref 135–145)

## 2018-02-18 LAB — CBC
HCT: 41.2 % (ref 39.0–52.0)
Hemoglobin: 12.4 g/dL — ABNORMAL LOW (ref 13.0–17.0)
MCH: 25.7 pg — AB (ref 26.0–34.0)
MCHC: 30.1 g/dL (ref 30.0–36.0)
MCV: 85.3 fL (ref 78.0–100.0)
PLATELETS: 346 10*3/uL (ref 150–400)
RBC: 4.83 MIL/uL (ref 4.22–5.81)
RDW: 20.3 % — AB (ref 11.5–15.5)
WBC: 20.6 10*3/uL — ABNORMAL HIGH (ref 4.0–10.5)

## 2018-02-18 LAB — GLUCOSE, CAPILLARY
Glucose-Capillary: 103 mg/dL — ABNORMAL HIGH (ref 65–99)
Glucose-Capillary: 120 mg/dL — ABNORMAL HIGH (ref 65–99)
Glucose-Capillary: 124 mg/dL — ABNORMAL HIGH (ref 65–99)
Glucose-Capillary: 130 mg/dL — ABNORMAL HIGH (ref 65–99)
Glucose-Capillary: 149 mg/dL — ABNORMAL HIGH (ref 65–99)
Glucose-Capillary: 165 mg/dL — ABNORMAL HIGH (ref 65–99)

## 2018-02-18 MED ORDER — DEXAMETHASONE SODIUM PHOSPHATE 10 MG/ML IJ SOLN
4.0000 mg | Freq: Two times a day (BID) | INTRAMUSCULAR | Status: DC
  Administered 2018-02-18 – 2018-02-19 (×3): 4 mg via INTRAVENOUS
  Filled 2018-02-18 (×3): qty 1

## 2018-02-18 NOTE — Progress Notes (Signed)
  Speech Language Pathology Treatment: Dysphagia  Patient Details Name: John Sampson MRN: [id number] DOB: 31-Dec-1935 Today's Date: 02/18/2018 Time: 1050-1100 SLP Time Calculation (min) (ACUTE ONLY): 10 min  Assessment / Plan / Recommendation Clinical Impression  Pt reports better management of secretions, and toleration of ice chips appears improved today. Overt signs of aspiration persist with thin liquids. There is not immediate coughing today, however pt presents with multiple swallows, increased wheezing and shortness of breath after small sip of liquid, and delayed coughing, all concerning for aspiration. Pt has just returned from chest x-ray; results not yet available. Will continue to follow closely for improvements at bedside warranting repeat of instrumental study.       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 Also with total thyroidectomy and laryngoplasty. He presented to [facility] increasing SOB and hoarseness. He was taken to the OR on 3/15 by Dr. Lucia Gaskins for injection of left vocal cord to strengthen his voice. He presented today to Dr. Pollie Friar office and there was concern for dehydration and dysphagia so he was sent to [facility] for direct admission (actually observation). Since the procedure, he has been able to have any solid or liquid food - it just won't go down or it goes down and comes right back up. Also unable to swallow any pills. He had the procedure Friday and did fine. He was told to go home with a soft diet and he was able to eat and drink that night. He ate breakfast ok Saturday but he wasn't able to eat after that. Dr. Shirlee More that on in-office exam the vocal cords are clear without swelling or infection but the patient was unable to swallow water in his office. He recommends IVF, labs, modified barium swallow evaluationfor esophageal obstruction/aspiration.      SLP Plan  Continue with current plan of care       Recommendations  Diet recommendations: NPO Medication Administration: Via alternative means                Oral Care Recommendations: Oral care QID SLP Visit Diagnosis: Dysphagia, pharyngoesophageal phase (R13.14) Plan: Continue with current plan of care       [place], [place], CCC-SLP Speech-Language Pathologist [phone]  Aliene Altes 02/18/2018, 11:04 AM

## 2018-02-18 NOTE — Progress Notes (Signed)
PROGRESS NOTE    John Sampson  NOB:[id number] DOB: 06/01/1936 DOA: 02/14/2018 PCP: Sinda Du, MD  Brief Narrative:John Sampson is a 82 y.o. 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  He presented to ENT with increasing SOB and hoarseness found to have VOcal cord paralysis.  He was taken to the OR on 3/15 by Dr. Lucia Gaskins and underwent prolaryn injection to vocal cords, then presented 3/19  to Dr. Pollie Friar office and there was concern for dehydration and dysphagia so he was sent to [facility] for direct admission.  Since the procedure, he has been able to have any solid or liquid food - it just won't go down or it goes down and comes right back up.  Also unable to swallow any pills.  Modified Barium swallow 3/20 with severe cervical esophageal swelling with resultant dysphagia with obstructing tissue at C5/6 and C7. Pt is unable to pass 1/4 teaspoon of thin liquids despite maximal effortful swallow  -3/20 started steroids and Abx, Cortrak placed for tube feeds  Assessment & Plan:   Severe Cervical esophageal swelling with dysphagia --Status post prolaryn injection to vocal cords 3/15 for vocal cord paralysis, with resultant profound dysphagia  -s/p MBS 3/20 which noted Severe cervical esophageal swelling with resultant dysphagia with obstructing tissue at C5/6 and C7. Pt was unable to pass 1/4 teaspoon of thin liquids despite maximal effortful swallow  -ENt, Dr.Newman following, continue IV Decadron and Unasyn -Small cortrak placed under fluoroscopy for nutrition and meds -no significant improvement in dysphagia yet -Plan for repeat modified barium swallow on Sunday   leukocytosis -White count has jumped to 20,000 today, concern for potential aspiration pneumonia -We'll check chest x-ray, afebrile, clinically stable we'll monitor -Continue IV Unasyn -steroids likely contributing  as well  P.Afib on Xarelto -in NSR, and HR controlled -hold Xarelto, continue full dose lovenox for now -PO metoprolol and Diltiazem on hold -continue IV lopressor in the interim  H/o Hypothyroidism -currently hyperthyroid by labs, oversupressed -recheck levels and resume synthroid at very small dose in 4-6weeks  HTN -unable to tolerate PO, IV metoprolol and when necessary hydralazine for now  HLD -Hold Zocor   Chronic diastolic heart failure -clinically compensated, stop IVF  R leg abrasion/skin tear -no s/s of infection, will ask Woc for recs  DVT prophylaxis: Treatment-dose Lovenox Code Status:  DNR  Family Communication: no family at bedside, called and updated son John Sampson Disposition Plan:  home when improved, able to swallow safely likely 3-4 days at least  Consults  - ENT   Procedures:   Antimicrobials:    Subjective: -he feels that his ability to manage his secretions may be a little better, however still having to use the suction [place]  Objective: Vitals:   02/18/18 0653 02/18/18 0856 02/18/18 0900 02/18/18 1152  BP: (!) 154/58 (!) 164/69  (!) 165/57  Pulse: 62 71  69  Resp:      Temp:   98.4 F (36.9 C)   TempSrc:   Oral   SpO2:  97%    Weight:      Height:        Intake/Output Summary (Last 24 hours) at 02/18/2018 1308 Last data filed at 02/18/2018 0900 Gross per 24 hour  Intake 2027.5 ml  Output 2125 ml  Net -97.5 ml   Filed Weights   02/16/18 2008 02/17/18 0432 02/18/18 0443  Weight: 87.9 kg (  193 lb 12.6 oz) 88.2 kg (194 lb 7.1 oz) 89.1 kg (196 lb 6.9 oz)    Examination:  Gen: Awake, Alert, Oriented X 3, elderly, chronically ill male, sitting in bed HEENT: PERRLA, Neck supple, no JVD Lungs: few rhonchi on theleft CVS: RRR,No Gallops,Rubs or new Murmurs Abd: soft, Non tender, non distended, BS present Extremities: No Cyanosis, Clubbing or edema Skin: no new rashes Psychiatry: Judgement and insight appear normal. Mood &  affect appropriate.     Data Reviewed:   CBC: Recent Labs  Lab 02/14/18 1601 02/15/18 0552 02/16/18 0801 02/18/18 0621  WBC 9.2 9.3 10.2 20.6*  NEUTROABS 7.1  --   --   --   HGB 11.2* 11.1* 11.5* 12.4*  HCT 36.5* 35.8* 37.6* 41.2  MCV 83.7 84.6 83.0 85.3  PLT 285 281 312 481   Basic Metabolic Panel: Recent Labs  Lab 02/14/18 1601 02/15/18 0552 02/16/18 0938 02/18/18 0621  NA 140 140 138 137  K 3.4* 4.2 3.5 4.3  CL 105 106 107 100*  CO2 24 22 21* 26  GLUCOSE 100* 78 143* 130*  BUN 25* 23* 23* 28*  CREATININE 0.98 0.81 0.73 0.78  CALCIUM 9.3 8.9 9.2 9.1   GFR: Estimated Creatinine Clearance: 81.4 mL/min (by C-G formula based on SCr of 0.78 mg/dL). Liver Function Tests: Recent Labs  Lab 02/14/18 1601  AST 30  ALT 22  ALKPHOS 62  BILITOT 1.3*  PROT 6.7  ALBUMIN 3.6   No results for input(s): LIPASE, AMYLASE in the last 168 hours. No results for input(s): AMMONIA in the last 168 hours. Coagulation Profile: No results for input(s): INR, PROTIME in the last 168 hours. Cardiac Enzymes: No results for input(s): CKTOTAL, CKMB, CKMBINDEX, TROPONINI in the last 168 hours. BNP (last 3 results) No results for input(s): PROBNP in the last 8760 hours. HbA1C: No results for input(s): HGBA1C in the last 72 hours. CBG: Recent Labs  Lab 02/17/18 2021 02/18/18 0005 02/18/18 0433 02/18/18 0726 02/18/18 1148  GLUCAP 144* 149* 165* 124* 120*   Lipid Profile: No results for input(s): CHOL, HDL, LDLCALC, TRIG, CHOLHDL, LDLDIRECT in the last 72 hours. Thyroid Function Tests: No results for input(s): TSH, T4TOTAL, FREET4, T3FREE, THYROIDAB in the last 72 hours. Anemia Panel: No results for input(s): VITAMINB12, FOLATE, FERRITIN, TIBC, IRON, RETICCTPCT in the last 72 hours. Urine analysis:    Component Value Date/Time   COLORURINE YELLOW 12/06/2017 1215   APPEARANCEUR HAZY (A) 12/06/2017 1215   LABSPEC 1.021 12/06/2017 1215   PHURINE 5.0 12/06/2017 1215   GLUCOSEU  NEGATIVE 12/06/2017 1215   HGBUR LARGE (A) 12/06/2017 1215   BILIRUBINUR NEGATIVE 12/06/2017 1215   KETONESUR NEGATIVE 12/06/2017 1215   PROTEINUR 30 (A) 12/06/2017 1215   NITRITE NEGATIVE 12/06/2017 1215   LEUKOCYTESUR TRACE (A) 12/06/2017 1215   Sepsis Labs: @LABRCNTIP (procalcitonin:4,lacticidven:4)  )No results found for this or any previous visit (from the past 240 hour(s)).       Radiology Studies: Dg Chest 2 View  Result Date: 02/18/2018 CLINICAL DATA:  Leukocytosis. EXAM: CHEST - 2 VIEW COMPARISON:  Chest x-ray dated December 06, 2017. FINDINGS: Feeding tube noted with the tip likely in the distal stomach. Stable cardiomegaly status post CABG. Normal pulmonary vascularity. Emphysema. No focal consolidation, pleural effusion, or pneumothorax. Unchanged elevation of the right hemidiaphragm. No acute osseous abnormality. IMPRESSION: 1. COPD.  No active cardiopulmonary disease. Electronically Signed   By: Titus Dubin M.D.   On: 02/18/2018 11:37  Scheduled Meds: . dexamethasone  4 mg Intravenous Q12H  . enoxaparin (LOVENOX) injection  1 mg/kg Subcutaneous Q12H  . feeding supplement (PRO-STAT SUGAR FREE 64)  30 mL Per Tube BID  . free water  200 mL Per Tube QID  . mouth rinse  15 mL Mouth Rinse BID  . metoprolol tartrate  5 mg Intravenous Q6H  .  morphine injection  1 mg Intravenous Once  . mupirocin ointment   Topical Daily   Continuous Infusions: . ampicillin-sulbactam (UNASYN) IV 1.5 g (02/18/18 1302)  . feeding supplement (JEVITY 1.2 CAL) 1,000 mL (02/18/18 0549)     LOS: 3 days    Time spent: 26min    Domenic Polite, MD [facility] Page via [email or web address], password [email or web address] After 7PM please contact night-coverage  02/18/2018, 1:08 PM

## 2018-02-19 ENCOUNTER — Inpatient Hospital Stay (HOSPITAL_COMMUNITY): Payer: Medicare Other

## 2018-02-19 LAB — BASIC METABOLIC PANEL
Anion gap: 9 (ref 5–15)
BUN: 37 mg/dL — ABNORMAL HIGH (ref 6–20)
CHLORIDE: 101 mmol/L (ref 101–111)
CO2: 27 mmol/L (ref 22–32)
Calcium: 9.2 mg/dL (ref 8.9–10.3)
Creatinine, Ser: 0.84 mg/dL (ref 0.61–1.24)
GFR calc Af Amer: >60 mL/min (ref >60)
GFR calc non Af Amer: >60 mL/min (ref >60)
GLUCOSE: 136 mg/dL — AB (ref 65–99)
POTASSIUM: 4.5 mmol/L (ref 3.5–5.1)
Sodium: 137 mmol/L (ref 135–145)

## 2018-02-19 LAB — CBC
HEMATOCRIT: 40.6 % (ref 39.0–52.0)
Hemoglobin: 12.5 g/dL — ABNORMAL LOW (ref 13.0–17.0)
MCH: 26 pg (ref 26.0–34.0)
MCHC: 30.8 g/dL (ref 30.0–36.0)
MCV: 84.4 fL (ref 78.0–100.0)
PLATELETS: 318 10*3/uL (ref 150–400)
RBC: 4.81 MIL/uL (ref 4.22–5.81)
RDW: 20.5 % — ABNORMAL HIGH (ref 11.5–15.5)
WBC: 19.6 10*3/uL — ABNORMAL HIGH (ref 4.0–10.5)

## 2018-02-19 LAB — GLUCOSE, CAPILLARY
GLUCOSE-CAPILLARY: 145 mg/dL — AB (ref 65–99)
GLUCOSE-CAPILLARY: 121 mg/dL — AB (ref 65–99)
GLUCOSE-CAPILLARY: 127 mg/dL — AB (ref 65–99)
GLUCOSE-CAPILLARY: 153 mg/dL — AB (ref 65–99)
Glucose-Capillary: 131 mg/dL — ABNORMAL HIGH (ref 65–99)
Glucose-Capillary: 140 mg/dL — ABNORMAL HIGH (ref 65–99)

## 2018-02-19 MED ORDER — FUROSEMIDE 10 MG/ML IJ SOLN
20.0000 mg | Freq: Two times a day (BID) | INTRAMUSCULAR | Status: AC
  Administered 2018-02-19 (×2): 20 mg via INTRAVENOUS
  Filled 2018-02-19 (×2): qty 2

## 2018-02-19 NOTE — Progress Notes (Signed)
  Speech Language Pathology Treatment: Dysphagia  Patient Details Name: John Sampson MRN: [id number] DOB: 12-12-1935 Today's Date: 02/19/2018 Time: [id number] SLP Time Calculation (min) (ACUTE ONLY): 12 min  Assessment / Plan / Recommendation Clinical Impression  Pt seen for reassessment to determine readiness for MBS. Voice continues to improve; pt upright in chair reporting oral care just performed. With ice chips, pt toleration appears improved today. Pt requires only single swallows, no coughing or throat clearing noted. With initial sips of water, pt has a delayed cough, mobilizes and clears secretions. With subsequent sips of thin water, no further coughing or signs of aspiration, and pt reports improved sensation of clearance. Will proceed with repeat MBS to determine if pt can safely initiate some POs. Additional recommendations to follow instrumental testing, to be completed this afternoon.    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 Also with total thyroidectomy and laryngoplasty. He presented to [facility] increasing SOB and hoarseness. He was taken to the OR on 3/15 by Dr. Lucia Gaskins for injection of left vocal cord to strengthen his voice. He presented today to Dr. Pollie Friar office and there was concern for dehydration and dysphagia so he was sent to [facility] for direct admission (actually observation). Since the procedure, he has been able to have any solid or liquid food - it just won't go down or it goes down and comes right back up. Also unable to swallow any pills. He had the procedure Friday and did fine. He was told to go home with a soft diet and he was able to eat and drink that night. He ate breakfast ok Saturday but he wasn't able to eat after that. Dr. Shirlee More that on in-office exam the vocal cords are clear without swelling or infection but the patient was unable to swallow  water in his office. He recommends IVF, labs, modified barium swallow evaluationfor esophageal obstruction/aspiration.      SLP Plan  MBS;New goals to be determined pending instrumental study       Recommendations  Diet recommendations: NPO Medication Administration: Via alternative means                Oral Care Recommendations: Oral care QID SLP Visit Diagnosis: Dysphagia, pharyngoesophageal phase (R13.14) Plan: MBS;New goals to be determined pending instrumental study       [place], [place], [place] Pathologist [phone]  Aliene Altes 02/19/2018, 11:08 AM

## 2018-02-19 NOTE — Progress Notes (Signed)
Modified Barium Swallow Progress Note  Patient Details  Name: John Sampson MRN: [id number] Date of Birth: Jul 31, 1936  Today's Date: 02/19/2018  Modified Barium Swallow completed.  Full report located under Chart Review in the Imaging Section.  Brief recommendations include the following:  Clinical Impression  Patient presents with improving, more moderate cervical esophageal dysphagia due to obstructing tissue at C5/6 and C7. Pt is able to pass approximately ~50% bolus through the cervical esophagus in head neutral position, however there is penetration of backflow after the swallow (worse with liquids) with all consistencies, and there is sensed aspiration with thin liquids. Compensatory manuevers attempted, and most effective was head turn left with puree and honey-thick liquids and multiple dry swallows to decrease residue. Pt then able to hock to expectorate mild residue that remained in the vallecule. Head turn did not prevent penetration of backflow of thin and nectar thick liquids from the cervical esophagus. Chin tuck was not effective. Pt educated in real time using video feedback re: use of strategies and frequent oral care to reduce aspiration risk. Recommend inititating dys 1 (puree) and honey thick liquids with head turn left and multiple swallows per bite, cough or hock to expectorate periodically. As consuming POs will be a lengthy and laborious process due to number of swallows required, would consider continuation of cortrak for now as pt may have difficulty maintaining adequate nutrition/hydration with POs alone. Pt encouraged to complete thorough oral care before and after meals.    Swallow Evaluation Recommendations       SLP Diet Recommendations: Dysphagia 1 (Puree) solids;Honey thick liquids;Alternative means - temporary   Liquid Administration via: Cup;Spoon   Medication Administration: Via alternative means   Supervision: Full supervision/cueing for compensatory  strategies   Compensations: Multiple dry swallows after each bite/sip;Effortful swallow;Other (Comment)(Head turn to left)   Postural Changes: Seated upright at 90 degrees   Oral Care Recommendations: Oral care before and after PO   Other Recommendations: Order thickener from pharmacy;Prohibited food (jello, ice cream, thin soups);Remove water pitcher;Have oral suction available  Deneise Lever, [place], CCC-SLP Speech-Language Pathologist [phone]  Aliene Altes 02/19/2018,1:52 PM

## 2018-02-19 NOTE — Progress Notes (Signed)
PROGRESS NOTE    John Sampson  JJH:[id number] DOB: 03-Jan-1936 DOA: 02/14/2018 PCP: Sinda Du, MD  Brief Narrative:John Sampson is a 82 y.o. 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  He presented to ENT with increasing SOB and hoarseness found to have VOcal cord paralysis.  He was taken to the OR on 3/15 by Dr. Lucia Gaskins and underwent prolaryn injection to vocal cords, then presented 3/19  to Dr. Pollie Friar office and there was concern for dehydration and dysphagia so he was sent to [facility] for direct admission.  Since the procedure, he has been able to have any solid or liquid food - it just won't go down or it goes down and comes right back up.  Also unable to swallow any pills.  Modified Barium swallow 3/20 with severe cervical esophageal swelling with resultant dysphagia with obstructing tissue at C5/6 and C7. Pt is unable to pass 1/4 teaspoon of thin liquids despite maximal effortful swallow  -3/20 started steroids and Abx, Cortrak placed for tube feeds  Assessment & Plan:   Severe Cervical esophageal swelling with dysphagia --Status post prolaryn injection to vocal cords 3/15 for vocal cord paralysis, with resultant profound dysphagia  -s/p MBS 3/20 which noted Severe cervical esophageal swelling with resultant dysphagia with obstructing tissue at C5/6 and C7. Pt was unable to pass 1/4 teaspoon of thin liquids despite maximal effortful swallow  -ENT, Dr.Newman following, continue IV Decadron and Unasyn -Small cortrak placed under fluoroscopy for nutrition and meds -no significant improvement in dysphagia yet -Plan for repeat modified barium swallow today -if fails will check CT neck   leukocytosis -White count has jumped to 20,000 yesterday, I was concerned abt potential aspiration pneumonia -CXR negative -Continue IV Unasyn -steroids likely contributing as  well  P.Afib on Xarelto -in NSR, and HR controlled -hold Xarelto, continue full dose lovenox for now -PO metoprolol and Diltiazem on hold -continue IV lopressor in the interim  H/o Hypothyroidism -currently hyperthyroid by labs, oversupressed -recheck levels and resume synthroid at very small dose in 4-6weeks  HTN -unable to tolerate PO, IV metoprolol and when necessary hydralazine for now  HLD -Hold Zocor   Acute on Chronic diastolic heart failure -slightly overloaded -Lasix x2 today  R leg abrasion/skin tear -no s/s of infection, will ask [facility] for recs  DVT prophylaxis: Treatment-dose Lovenox Code Status:  DNR  Family Communication: no family at bedside, called and updated son [name] Disposition Plan:  home when improved, able to swallow safely likely 3-4 days at least  Consults  - ENT   Procedures:   Antimicrobials:    Subjective: -feels a little better, thinks swallowing may be improving  Objective: Vitals:   02/18/18 1332 02/18/18 1634 02/18/18 2154 02/19/18 0436  BP:  (!) 183/68 (!) 180/72 (!) 165/61  Pulse:  73 63 60  Resp: (!) 22 (!) 24 19 (!) 21  Temp:  98.1 F (36.7 C) (!) 97.5 F (36.4 C) 97.6 F (36.4 C)  TempSrc:  Oral Oral Axillary  SpO2:  96% 97% 96%  Weight:      Height:        Intake/Output Summary (Last 24 hours) at 02/19/2018 1258 Last data filed at 02/19/2018 1203 Gross per 24 hour  Intake 0 ml  Output 1120 ml  Net -1120 ml   Filed Weights   02/16/18 2008 02/17/18 0432 02/18/18 0443  Weight: 87.9 kg (  193 lb 12.6 oz) 88.2 kg (194 lb 7.1 oz) 89.1 kg (196 lb 6.9 oz)    Examination: Gen: Awake, Alert, Oriented X 3, Elderly, chronically ill male, sitting in bed HEENT: PERRLA, Neck supple, no JVD Lungs: with bibasilar crackles CVS: RRR,No Gallops,Rubs or new Murmurs Abd: soft, Non tender, non distended, BS present Extremities: 1+ edema Skin: no new rashes Psychiatry: Judgement and insight appear normal. Mood & affect  appropriate.     Data Reviewed:   CBC: Recent Labs  Lab 02/14/18 1601 02/15/18 0552 02/16/18 0801 02/18/18 0621 02/19/18 0601  WBC 9.2 9.3 10.2 20.6* 19.6*  NEUTROABS 7.1  --   --   --   --   HGB 11.2* 11.1* 11.5* 12.4* 12.5*  HCT 36.5* 35.8* 37.6* 41.2 40.6  MCV 83.7 84.6 83.0 85.3 84.4  PLT 285 281 312 346 062   Basic Metabolic Panel: Recent Labs  Lab 02/14/18 1601 02/15/18 0552 02/16/18 0938 02/18/18 0621 02/19/18 0601  NA 140 140 138 137 137  K 3.4* 4.2 3.5 4.3 4.5  CL 105 106 107 100* 101  CO2 24 22 21* 26 27  GLUCOSE 100* 78 143* 130* 136*  BUN 25* 23* 23* 28* 37*  CREATININE 0.98 0.81 0.73 0.78 0.84  CALCIUM 9.3 8.9 9.2 9.1 9.2   GFR: Estimated Creatinine Clearance: 77.5 mL/min (by C-G formula based on SCr of 0.84 mg/dL). Liver Function Tests: Recent Labs  Lab 02/14/18 1601  AST 30  ALT 22  ALKPHOS 62  BILITOT 1.3*  PROT 6.7  ALBUMIN 3.6   No results for input(s): LIPASE, AMYLASE in the last 168 hours. No results for input(s): AMMONIA in the last 168 hours. Coagulation Profile: No results for input(s): INR, PROTIME in the last 168 hours. Cardiac Enzymes: No results for input(s): CKTOTAL, CKMB, CKMBINDEX, TROPONINI in the last 168 hours. BNP (last 3 results) No results for input(s): PROBNP in the last 8760 hours. HbA1C: No results for input(s): HGBA1C in the last 72 hours. CBG: Recent Labs  Lab 02/18/18 2016 02/19/18 0009 02/19/18 0425 02/19/18 0828 02/19/18 1204  GLUCAP 103* 140* 145* 121* 127*   Lipid Profile: No results for input(s): CHOL, HDL, LDLCALC, TRIG, CHOLHDL, LDLDIRECT in the last 72 hours. Thyroid Function Tests: No results for input(s): TSH, T4TOTAL, FREET4, T3FREE, THYROIDAB in the last 72 hours. Anemia Panel: No results for input(s): VITAMINB12, FOLATE, FERRITIN, TIBC, IRON, RETICCTPCT in the last 72 hours. Urine analysis:    Component Value Date/Time   COLORURINE YELLOW 12/06/2017 1215   APPEARANCEUR HAZY (A)  12/06/2017 1215   LABSPEC 1.021 12/06/2017 1215   PHURINE 5.0 12/06/2017 1215   GLUCOSEU NEGATIVE 12/06/2017 1215   HGBUR LARGE (A) 12/06/2017 1215   BILIRUBINUR NEGATIVE 12/06/2017 1215   KETONESUR NEGATIVE 12/06/2017 1215   PROTEINUR 30 (A) 12/06/2017 1215   NITRITE NEGATIVE 12/06/2017 1215   LEUKOCYTESUR TRACE (A) 12/06/2017 1215   Sepsis Labs: @LABRCNTIP (procalcitonin:4,lacticidven:4)  )No results found for this or any previous visit (from the past 240 hour(s)).       Radiology Studies: Dg Chest 2 View  Result Date: 02/18/2018 CLINICAL DATA:  Leukocytosis. EXAM: CHEST - 2 VIEW COMPARISON:  Chest x-ray dated December 06, 2017. FINDINGS: Feeding tube noted with the tip likely in the distal stomach. Stable cardiomegaly status post CABG. Normal pulmonary vascularity. Emphysema. No focal consolidation, pleural effusion, or pneumothorax. Unchanged elevation of the right hemidiaphragm. No acute osseous abnormality. IMPRESSION: 1. COPD.  No active cardiopulmonary disease. Electronically Signed  By: Titus Dubin M.D.   On: 02/18/2018 11:37        Scheduled Meds: . dexamethasone  4 mg Intravenous Q12H  . enoxaparin (LOVENOX) injection  1 mg/kg Subcutaneous Q12H  . feeding supplement (PRO-STAT SUGAR FREE 64)  30 mL Per Tube BID  . free water  200 mL Per Tube QID  . furosemide  20 mg Intravenous Q12H  . mouth rinse  15 mL Mouth Rinse BID  . metoprolol tartrate  5 mg Intravenous Q6H  .  morphine injection  1 mg Intravenous Once  . mupirocin ointment   Topical Daily   Continuous Infusions: . ampicillin-sulbactam (UNASYN) IV 1.5 g (02/19/18 0627)  . feeding supplement (JEVITY 1.2 CAL) 1,000 mL (02/18/18 2152)     LOS: 4 days    Time spent:16min    Domenic Polite, MD [facility] Page via [email or web address], password [email or web address] After 7PM please contact night-coverage  02/19/2018, 12:58 PM

## 2018-02-19 NOTE — Progress Notes (Signed)
Follow up on patient Feeling better and able to swallow a little better. Had repeat modified ba swallow this am with improvement and patient will start on full liquid trial pos Continue with IV abx I will follow up tomorrow am to see how he's doing with his swallowing and pos C Lucia Gaskins

## 2018-02-20 LAB — CBC
HCT: 40.6 % (ref 39.0–52.0)
Hemoglobin: 12.6 g/dL — ABNORMAL LOW (ref 13.0–17.0)
MCH: 26.3 pg (ref 26.0–34.0)
MCHC: 31 g/dL (ref 30.0–36.0)
MCV: 84.6 fL (ref 78.0–100.0)
PLATELETS: 325 10*3/uL (ref 150–400)
RBC: 4.8 MIL/uL (ref 4.22–5.81)
RDW: 20.9 % — AB (ref 11.5–15.5)
WBC: 21.8 10*3/uL — AB (ref 4.0–10.5)

## 2018-02-20 LAB — BASIC METABOLIC PANEL
ANION GAP: 10 (ref 5–15)
BUN: 49 mg/dL — AB (ref 6–20)
CALCIUM: 8.7 mg/dL — AB (ref 8.9–10.3)
CO2: 25 mmol/L (ref 22–32)
Chloride: 99 mmol/L — ABNORMAL LOW (ref 101–111)
Creatinine, Ser: 0.92 mg/dL (ref 0.61–1.24)
GFR calc Af Amer: >60 mL/min (ref >60)
GFR calc non Af Amer: >60 mL/min (ref >60)
GLUCOSE: 142 mg/dL — AB (ref 65–99)
POTASSIUM: 5 mmol/L (ref 3.5–5.1)
SODIUM: 134 mmol/L — AB (ref 135–145)

## 2018-02-20 LAB — GLUCOSE, CAPILLARY
GLUCOSE-CAPILLARY: 117 mg/dL — AB (ref 65–99)
GLUCOSE-CAPILLARY: 135 mg/dL — AB (ref 65–99)
GLUCOSE-CAPILLARY: 143 mg/dL — AB (ref 65–99)
GLUCOSE-CAPILLARY: 165 mg/dL — AB (ref 65–99)
Glucose-Capillary: 108 mg/dL — ABNORMAL HIGH (ref 65–99)
Glucose-Capillary: 129 mg/dL — ABNORMAL HIGH (ref 65–99)
Glucose-Capillary: 133 mg/dL — ABNORMAL HIGH (ref 65–99)

## 2018-02-20 MED ORDER — PIPERACILLIN-TAZOBACTAM 3.375 G IVPB
3.3750 g | Freq: Three times a day (TID) | INTRAVENOUS | Status: DC
  Filled 2018-02-20 (×2): qty 50

## 2018-02-20 MED ORDER — PIPERACILLIN-TAZOBACTAM 3.375 G IVPB
3.3750 g | Freq: Three times a day (TID) | INTRAVENOUS | Status: DC
  Administered 2018-02-20 – 2018-02-22 (×6): 3.375 g via INTRAVENOUS
  Filled 2018-02-20 (×9): qty 50

## 2018-02-20 MED ORDER — DEXAMETHASONE SODIUM PHOSPHATE 10 MG/ML IJ SOLN
4.0000 mg | INTRAMUSCULAR | Status: DC
  Administered 2018-02-20 – 2018-02-21 (×2): 4 mg via INTRAVENOUS
  Filled 2018-02-20 (×2): qty 1

## 2018-02-20 NOTE — Progress Notes (Signed)
PROGRESS NOTE    John Sampson  FKC:[id number] DOB: 1936-03-02 DOA: 02/14/2018 PCP: Sinda Du, MD  Brief Narrative:John Sampson is a 82 y.o. 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  He presented to ENT with increasing SOB and hoarseness found to have VOcal cord paralysis.  He was taken to the OR on 3/15 by Dr. Lucia Gaskins and underwent prolaryn injection to vocal cords, then presented 3/19  to Dr. Pollie Friar office and there was concern for dehydration and dysphagia so he was sent to [facility] for direct admission.  Since the procedure, he has been able to have any solid or liquid food - it just won't go down or it goes down and comes right back up.  Also unable to swallow any pills.  Modified Barium swallow 3/20 with severe cervical esophageal swelling with resultant dysphagia with obstructing tissue at C5/6 and C7. Pt is unable to pass 1/4 teaspoon of thin liquids despite maximal effortful swallow  -3/20 started steroids and Abx, Cortrak placed for tube feeds  Assessment & Plan:   Severe Cervical esophageal swelling with dysphagia --Status post prolaryn injection to vocal cords 3/15 for vocal cord paralysis, with resultant profound dysphagia, MBS 3/20 which noted Severe cervical esophageal swelling with resultant dysphagia with obstructing tissue at C5/6 and C7. -ENT, Dr.Newman following, clinically improving slowly continue IV Decadron and Unasyn, cut down decadron and changed Unasyn to Zosyn given persistent severe leukocytosis and possible aspiration -Small cortrak placed under fluoroscopy for nutrition and meds, tolerating TFs -no significant improvement in dysphagia yet -status post repeat barium swallow on 3/24 with some clinical improvement, started on dysphagia 1 diet and honey thick liquids 3/24, not enough to maintain adequate nutrition, continue core track and tube feeds for  a few more days, continue to monitor swallowing closely   leukocytosis -WBC remains up, my concern is for aspiration pneumonitis, although CXR negtaive -change Unasyn to Zosyn -steroids likely contributing as well  P.Afib on Xarelto -in NSR, and HR controlled -hold Xarelto, continue full dose lovenox for now -PO metoprolol and Diltiazem on hold -continue IV lopressor in the interim  H/o Hypothyroidism -currently hyperthyroid by labs, oversupressed -recheck levels and resume synthroid at very small dose in 4-6weeks  HTN -unable to tolerate PO, IV metoprolol and when necessary hydralazine for now  HLD -Hold Zocor   Acute on Chronic diastolic heart failure -slightly overloaded -Lasix x2 today  R leg abrasion/skin tear -no s/s of infection, will ask [facility]oc for recs  DVT prophylaxis: Treatment-dose Lovenox Code Status:  DNR  Family Communication: no family at bedside, called and updated son John Sampson 3/23 Disposition Plan:  home when improved, able to swallow safely likely another 3-4 days at least  Consults  - ENT   Procedures:   Antimicrobials:    Subjective: -feels a little better, thinks swallowing may be improving  Objective: Vitals:   02/19/18 1701 02/19/18 2129 02/20/18 0436 02/20/18 1059  BP: (!) 159/70 106/71 (!) 118/57   Pulse: 78 70 (!) 59   Resp: 20 18 18    Temp: 98.5 F (36.9 C) 98.7 F (37.1 C) 98.2 F (36.8 C)   TempSrc: Oral Oral Oral   SpO2: 97% 97% 95% 93%  Weight:      Height:        Intake/Output Summary (Last 24 hours) at 02/20/2018 1241 Last data filed at 02/20/2018 1208 Gross per 24 hour  Intake -  Output 1650 ml  Net -1650 ml   Filed Weights   02/16/18 2008 02/17/18 0432 02/18/18 0443  Weight: 87.9 kg (193 lb 12.6 oz) 88.2 kg (194 lb 7.1 oz) 89.1 kg (196 lb 6.9 oz)    Examination: Gen: Awake, Alert, Oriented X 3, Elderly, chronically ill male, sitting in bed HEENT: PERRLA, Neck supple, no JVD Lungs: with bibasilar  crackles CVS: RRR,No Gallops,Rubs or new Murmurs Abd: soft, Non tender, non distended, BS present Extremities: 1+ edema Skin: no new rashes Psychiatry: Judgement and insight appear normal. Mood & affect appropriate.     Data Reviewed:   CBC: Recent Labs  Lab 02/14/18 1601 02/15/18 0552 02/16/18 0801 02/18/18 5631 02/19/18 0601 02/20/18 0512  WBC 9.2 9.3 10.2 20.6* 19.6* 21.8*  NEUTROABS 7.1  --   --   --   --   --   HGB 11.2* 11.1* 11.5* 12.4* 12.5* 12.6*  HCT 36.5* 35.8* 37.6* 41.2 40.6 40.6  MCV 83.7 84.6 83.0 85.3 84.4 84.6  PLT 285 281 312 346 318 497   Basic Metabolic Panel: Recent Labs  Lab 02/15/18 0552 02/16/18 0938 02/18/18 0621 02/19/18 0601 02/20/18 0512  NA 140 138 137 137 134*  K 4.2 3.5 4.3 4.5 5.0  CL 106 107 100* 101 99*  CO2 22 21* 26 27 25   GLUCOSE 78 143* 130* 136* 142*  BUN 23* 23* 28* 37* 49*  CREATININE 0.81 0.73 0.78 0.84 0.92  CALCIUM 8.9 9.2 9.1 9.2 8.7*   GFR: Estimated Creatinine Clearance: 70.7 mL/min (by C-G formula based on SCr of 0.92 mg/dL). Liver Function Tests: Recent Labs  Lab 02/14/18 1601  AST 30  ALT 22  ALKPHOS 62  BILITOT 1.3*  PROT 6.7  ALBUMIN 3.6   No results for input(s): LIPASE, AMYLASE in the last 168 hours. No results for input(s): AMMONIA in the last 168 hours. Coagulation Profile: No results for input(s): INR, PROTIME in the last 168 hours. Cardiac Enzymes: No results for input(s): CKTOTAL, CKMB, CKMBINDEX, TROPONINI in the last 168 hours. BNP (last 3 results) No results for input(s): PROBNP in the last 8760 hours. HbA1C: No results for input(s): HGBA1C in the last 72 hours. CBG: Recent Labs  Lab 02/19/18 2019 02/20/18 0033 02/20/18 0427 02/20/18 0805 02/20/18 1153  GLUCAP 131* 143* 165* 129* 117*   Lipid Profile: No results for input(s): CHOL, HDL, LDLCALC, TRIG, CHOLHDL, LDLDIRECT in the last 72 hours. Thyroid Function Tests: No results for input(s): TSH, T4TOTAL, FREET4, T3FREE,  THYROIDAB in the last 72 hours. Anemia Panel: No results for input(s): VITAMINB12, FOLATE, FERRITIN, TIBC, IRON, RETICCTPCT in the last 72 hours. Urine analysis:    Component Value Date/Time   COLORURINE YELLOW 12/06/2017 1215   APPEARANCEUR HAZY (A) 12/06/2017 1215   LABSPEC 1.021 12/06/2017 1215   PHURINE 5.0 12/06/2017 1215   GLUCOSEU NEGATIVE 12/06/2017 1215   HGBUR LARGE (A) 12/06/2017 1215   BILIRUBINUR NEGATIVE 12/06/2017 1215   KETONESUR NEGATIVE 12/06/2017 1215   PROTEINUR 30 (A) 12/06/2017 1215   NITRITE NEGATIVE 12/06/2017 1215   LEUKOCYTESUR TRACE (A) 12/06/2017 1215   Sepsis Labs: @LABRCNTIP (procalcitonin:4,lacticidven:4)  )No results found for this or any previous visit (from the past 240 hour(s)).       Radiology Studies: Dg Swallowing Func-speech Pathology  Result Date: 02/19/2018 Objective Swallowing Evaluation: Type of Study: MBS-Modified Barium Swallow Study  Patient Details Name: KAYLER RISE MRN: [id number] Date of Birth: [birth year] Today's Date: 02/19/2018 Time: SLP Start Time (ACUTE  ONLY): 1215 -SLP Stop Time (ACUTE ONLY): 1240 SLP Time Calculation (min) (ACUTE ONLY): 25 min Past Medical History: Past Medical History: Diagnosis Date . Atrial flutter ([place])   on Eliquis . AV malformation of GI tract  . COPD (chronic obstructive pulmonary disease) ([place]) 3.12.14  2D Echo, EF 50-55% . Coronary atherosclerosis of native coronary artery   Multivessel status post CABG . Gastric ulcer   Small - nonbleeding . GERD (gastroesophageal reflux disease)  . Headache(784.0)  . Hypothyroidism  . Iron deficiency anemia   Negative Givens capsule study  . Myocardial infarction ([place])  . PAD (peripheral artery disease) ([facility])   Moderate bilateral SFA disease at angiography 01/2013 . Pituitary macroadenoma ([place])  . Thyroid cancer ([place])  . Vocal cord paralysis   left Past Surgical History: Past Surgical History: Procedure Laterality Date . ABDOMINAL AORTAGRAM N/A 02/19/2013  Procedure:  ABDOMINAL Maxcine Ham;  Surgeon: [name], MD;  Location: [facility] CATH LAB;  Service: Cardiovascular;  Laterality: N/A; . CARDIAC CATHETERIZATION   . CARDIOVERSION N/A 06/16/2015  Procedure: CARDIOVERSION;  Surgeon: Satira Sark, MD;  Location: [facility] ORS;  Service: Cardiovascular;  Laterality: N/A; . COLONOSCOPY  08/18/2012  Procedure: COLONOSCOPY;  Surgeon: [name], MD;  Location: [facility] ENDO SUITE;  Service: Endoscopy;  Laterality: N/A;  1:25 . COLONOSCOPY N/A 09/12/2017  Procedure: COLONOSCOPY;  Surgeon: [name], MD;  [facility] END[facility];  Service: Endoscopy;  Laterality: N/A; . CORONARY ARTERY BYPASS GRAFT  2003  5 grafts - details not clear . CRANIOTOMY  12/31/2011  Procedure: CRANIOTOMY HYPOPHYSECTOMY TRANSNASAL APPROACH;  Surgeon: [name], MD;  Location: [place] NEURO ORS;  Service: Neurosurgery;  Laterality: N/A;  Transphenoidal Hypophysectomy With Fat Graft Harvest from right abdomen  . ESOPHAGOGASTRODUODENOSCOPY  08/18/2012  Procedure: ESOPHAGOGASTRODUODENOSCOPY (EGD);  Surgeon: [name], MD;  [facility];  Service: Endoscopy;  Laterality: N/A; . ESOPHAGOGASTRODUODENOSCOPY N/A 03/31/2017  Procedure: ESOPHAGOGASTRODUODENOSCOPY (EGD);  Surgeon: [name], MD;  [facility];  Service: Endoscopy;  Laterality: N/A; . ESOPHAGOGASTRODUODENOSCOPY N/A 09/12/2017  Procedure: ESOPHAGOGASTRODUODENOSCOPY (EGD);  Surgeon: [name], MD;  [facility];  Service: Endoscopy;  Laterality: N/A;  10:40 . EYE SURGERY  2012 . GIVENS CAPSULE STUDY N/A 01/25/2013  Procedure: GIVENS CAPSULE STUDY;  Surgeon: [name], MD;  [facility];  Service: Endoscopy;  Laterality: N/A;  730 . HOT HEMOSTASIS  03/31/2017  Procedure: HOT HEMOSTASIS (ARGON PLASMA COAGULATION/BICAP);  Surgeon: [name], MD;  [facility];  Service: Endoscopy;;  duodenum . LARYNGOPLASTY Left 09/14/2016  Procedure: LARYNGOPLASTY;  Surgeon: [name], MD;   Location: [place];  Service: ENT;  Laterality: Left; . LOWER EXTREMITY ANGIOGRAM N/A 02/19/2013  Procedure: LOWER EXTREMITY ANGIOGRAM;  Surgeon: [name], MD;  Location: [facility] CATH LAB;  Service: Cardiovascular;  Laterality: N/A; . MICROLARYNGOSCOPY N/A 02/10/2018  Procedure: MICROLARYNGOSCOPY WITH VOCAL CORD INJECTION;  Surgeon: [name], MD;  Location: [place];  Service: ENT;  Laterality: N/A; . PERIPHERAL VASCULAR CATHETERIZATION N/A 01/19/2016  Procedure: Abdominal Aortogram;  Surgeon: Conrad Trent, MD;  Location: [place] CV LAB;  Service: Cardiovascular;  Laterality: N/A; . PERIPHERAL VASCULAR CATHETERIZATION Bilateral 01/19/2016  Procedure: Lower Extremity Angiography;  Surgeon: Conrad Blairsburg, MD;  Location: [place] CV LAB;  Service: Cardiovascular;  Laterality: Bilateral; . PV Angiogram  02/19/13  Indications: slow healing left calf ulcer . Stress Myocardial Perfusion  12/08/2011  Indications: Abnormal EKG, Eval  of prior GABG . TEE WITHOUT CARDIOVERSION N/A 06/16/2015  Procedure: TRANSESOPHAGEAL ECHOCARDIOGRAM (TEE);  Surgeon: Satira Sark, MD;  Location[facility];  Service: Cardiovascular;  Laterality: N/A; . THYROIDECTOMY N/A 09/14/2016  Procedure: TOTAL THYROIDECTOMY;  Surgeon: [name], MD;  Location: [place];  Service: ENT;  Laterality: N/A; . 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 Also with total thyroidectomy and laryngoplasty. He presented to [facility] increasing SOB and hoarseness. He was taken to the OR on 3/15 by Dr. Lucia Gaskins for injection of left vocal cord to strengthen his voice. He presented today to Dr. Pollie Friar office and there was concern for dehydration and dysphagia so he was sent to [facility] for direct admission (actually observation). Since the procedure, he has been able to have any solid or liquid food - it just won't go  down or it goes down and comes right back up. Also unable to swallow any pills. He had the procedure Friday and did fine. He was told to go home with a soft diet and he was able to eat and drink that night. He ate breakfast ok Saturday but he wasn't able to eat after that. Dr. Shirlee More that on in-office exam the vocal cords are clear without swelling or infection but the patient was unable to swallow water in his office. He recommends IVF, labs, modified barium swallow evaluationfor esophageal obstruction/aspiration.  Subjective: Alert, cooperative Assessment / Plan / Recommendation CHL IP CLINICAL IMPRESSIONS 02/19/2018 Clinical Impression Patient presents with improving, more moderate cervical esophageal dysphagia due to obstructing tissue at C5/6 and C7. Pt is able to pass approximately ~50% bolus through the cervical esophagus in head neutral position, however there is penetration of backflow after the swallow (worse with liquids) with all consistencies, and there is sensed aspiration with thin liquids. Compensatory manuevers attempted, and most effective was head turn left with puree and honey-thick liquids and multiple dry swallows to decrease residue. Pt then able to hock to expectorate mild residue that remained in the vallecule. Head turn did not prevent penetration of backflow of thin and nectar thick liquids from the cervical esophagus. Chin tuck was not effective. Pt educated in real time using video feedback re: use of strategies and frequent oral care to reduce aspiration risk. Recommend inititating dys 1 (puree) and honey thick liquids with head turn left and multiple swallows per bite, cough or hock to expectorate periodically. As consuming POs will be a lengthy and laborious process due to number of swallows required, would consider continuation of cortrak for now as pt may have difficulty maintaining adequate nutrition/hydration with POs alone. Pt encouraged to complete thorough oral care  before and after meals.  SLP Visit Diagnosis Dysphagia, pharyngoesophageal phase (R13.14) Attention and concentration deficit following -- Frontal lobe and executive function deficit following -- Impact on safety and function Moderate aspiration risk   CHL IP TREATMENT RECOMMENDATION 02/19/2018 Treatment Recommendations Therapy as outlined in treatment plan below   Prognosis 02/19/2018 Prognosis for Safe Diet Advancement Good Barriers to Reach Goals -- Barriers/Prognosis Comment -- CHL IP DIET RECOMMENDATION 02/19/2018 SLP Diet Recommendations Dysphagia 1 (Puree) solids;Honey thick liquids;Alternative means - temporary Liquid Administration via Cup;Spoon Medication Administration Via alternative means Compensations Multiple dry swallows after each bite/sip;Effortful swallow;Other (Comment) Postural Changes Seated upright at 90 degrees   CHL IP OTHER RECOMMENDATIONS 02/19/2018 Recommended Consults -- Oral Care Recommendations Oral care before and after PO Other  Recommendations Order thickener from pharmacy;Prohibited food (jello, ice cream, thin soups);Remove water pitcher;Have oral suction available   CHL IP FOLLOW UP RECOMMENDATIONS 02/19/2018 Follow up Recommendations Other (comment)   CHL IP FREQUENCY AND DURATION 02/19/2018 Speech Therapy Frequency (ACUTE ONLY) min 2x/week Treatment Duration 2 weeks      CHL IP ORAL PHASE 02/19/2018 Oral Phase WFL Oral - Pudding Teaspoon -- Oral - Pudding Cup -- Oral - Honey Teaspoon -- Oral - Honey Cup -- Oral - Nectar Teaspoon -- Oral - Nectar Cup -- Oral - Nectar Straw -- Oral - Thin Teaspoon -- Oral - Thin Cup -- Oral - Thin Straw -- Oral - Puree -- Oral - Mech Soft -- Oral - Regular -- Oral - Multi-Consistency -- Oral - Pill -- Oral Phase - Comment --  CHL IP PHARYNGEAL PHASE 02/19/2018 Pharyngeal Phase WFL Pharyngeal- Pudding Teaspoon -- Pharyngeal -- Pharyngeal- Pudding Cup -- Pharyngeal -- Pharyngeal- Honey Teaspoon -- Pharyngeal -- Pharyngeal- Honey Cup -- Pharyngeal --  Pharyngeal- Nectar Teaspoon -- Pharyngeal -- Pharyngeal- Nectar Cup -- Pharyngeal -- Pharyngeal- Nectar Straw -- Pharyngeal -- Pharyngeal- Thin Teaspoon -- Pharyngeal -- Pharyngeal- Thin Cup -- Pharyngeal -- Pharyngeal- Thin Straw -- Pharyngeal -- Pharyngeal- Puree -- Pharyngeal -- Pharyngeal- Mechanical Soft -- Pharyngeal -- Pharyngeal- Regular -- Pharyngeal -- Pharyngeal- Multi-consistency -- Pharyngeal -- Pharyngeal- Pill -- Pharyngeal -- Pharyngeal Comment --  CHL IP CERVICAL ESOPHAGEAL PHASE 02/19/2018 Cervical Esophageal Phase Impaired Pudding Teaspoon -- Pudding Cup -- Honey Teaspoon Reduced cricopharyngeal relaxation;Esophageal backflow into the pharynx;Esophageal backflow into the larynx Honey Cup Reduced cricopharyngeal relaxation;Esophageal backflow into the pharynx;Esophageal backflow into the larynx Nectar Teaspoon -- Nectar Cup Reduced cricopharyngeal relaxation;Esophageal backflow into the pharynx;Esophageal backflow into the larynx Nectar Straw -- Thin Teaspoon Reduced cricopharyngeal relaxation;Esophageal backflow into the pharynx;Esophageal backflow into the larynx Thin Cup Reduced cricopharyngeal relaxation;Esophageal backflow into the pharynx;Esophageal backflow into the larynx Thin Straw NT Puree Reduced cricopharyngeal relaxation;Esophageal backflow into the pharynx;Esophageal backflow into the larynx Mechanical Soft -- Regular -- Multi-consistency -- Pill -- Cervical Esophageal Comment head turn left prevents penetration with honey thick liquids, puree only Deneise Lever, MS, [place] [phone] No flowsheet data found. Aliene Altes 02/19/2018, 1:58 PM                   Scheduled Meds: . dexamethasone  4 mg Intravenous Q24H  . enoxaparin (LOVENOX) injection  1 mg/kg Subcutaneous Q12H  . feeding supplement (PRO-STAT SUGAR FREE 64)  30 mL Per Tube BID  . free water  200 mL Per Tube QID  . mouth rinse  15 mL Mouth Rinse BID  . metoprolol tartrate  5 mg  Intravenous Q6H  .  morphine injection  1 mg Intravenous Once  . mupirocin ointment   Topical Daily   Continuous Infusions: . feeding supplement (JEVITY 1.2 CAL) 1,000 mL (02/20/18 0100)  . piperacillin-tazobactam (ZOSYN)  IV       LOS: 5 days    Time spent:39min    Domenic Polite, MD [facility] Page via [email or web address], password [email or web address] After 7PM please contact night-coverage  02/20/2018, 12:41 PM

## 2018-02-20 NOTE — Progress Notes (Addendum)
ANTICOAGULATION CONSULT NOTE - Follow Up Consult  Pharmacy Consult for Lovenox + Zosyn Indication: atrial fibrillation, aspiration PNA  Allergies  Allergen Reactions  . Morphine And Related Nausea And Vomiting    Patient Measurements: Height: 5\' 9"  (175.3 cm) Weight: 196 lb 6.9 oz (89.1 kg) IBW/kg (Calculated) : 70.7   Vital Signs: Temp: 98.2 F (36.8 C) (03/25 0436) Temp Source: Oral (03/25 0436) BP: 118/57 (03/25 0436) Pulse Rate: 59 (03/25 0436)  Labs: Recent Labs    02/18/18 0621 02/19/18 0601 02/20/18 0512  HGB 12.4* 12.5* 12.6*  HCT 41.2 40.6 40.6  PLT 346 318 325  CREATININE 0.78 0.84 0.92    Estimated Creatinine Clearance: 70.7 mL/min (by C-G formula based on SCr of 0.92 mg/dL).  Assessment:  Anticoag: Eliquis PTA for AFib >> dysphagia >> Lovenox. CBC and renal function stable.  ID: Abx D#5 for esophagitis, possible aspiration - afebrile, WBC 21.8 going up (on dexamethasone). Renal stable. No cultures  Unasyn 3/20 >>3/25 Zosyn 3/25>>  Goal of Therapy:  Anti-Xa level 0.6-1 units/ml 4hrs after LMWH dose given Monitor platelets by anticoagulation protocol: Yes  Eradication of infection    Plan:  Continue Lovenox 85mg  SQ Q12H CBC Q72H while on Lovenox Hopefully can resume NOAC in a day or two. Change Unasyn to Zosyn 3.375g IV q 8 hrs.  John Sampson [name], PharmD, BCPS Clinical Staff Pharmacist Pager [phone]  John Sampson 02/20/2018,7:48 AM

## 2018-02-20 NOTE — Progress Notes (Signed)
Ba swallow from yesterday shows improvement and patient taking thick diet po. Still has tendency to aspirate liquids. Swallow test shows partial obstruction of upper cervical esoph It may just be inflammatory as it is improving. Patient has a paralyzed left TVC and will be prone to aspiration I question whether this obstructing area would be amenable to dilation if it doesn't resolve on medical therapy. And does it need further eval with CT scan if it doesn't resolve on present therapy. I will continue to follow.

## 2018-02-20 NOTE — Progress Notes (Signed)
  Speech Language Pathology Treatment: Dysphagia  Patient Details Name: John Sampson MRN: [id number] DOB: 11-08-1936 Today's Date: 02/20/2018 Time: 1751-0258 SLP Time Calculation (min) (ACUTE ONLY): 8 min  Assessment / Plan / Recommendation Clinical Impression  Pt reports good tolerance of purees and honey thick liquids, just finished some with RN. Pt verbalizes his strategy is multiple swallows with head turn right or left, though SLP note recommends specifically head turn left was tested. Encouraged turn left given recommendation. Pt return demonstrates with no immediate coughing and subjective verbalization of success. Delayed "hock" to clear residual noted several minutes later outside the room. Encouraged ongoing oral care. SLP will continue to follow for signs of further improvement.   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 Also with total thyroidectomy and laryngoplasty. He presented to [facility] increasing SOB and hoarseness. He was taken to the OR on 3/15 by Dr. Lucia Gaskins for injection of left vocal cord to strengthen his voice. He presented today to Dr. Pollie Friar office and there was concern for dehydration and dysphagia so he was sent to [facility] for direct admission (actually observation). Since the procedure, he has been able to have any solid or liquid food - it just won't go down or it goes down and comes right back up. Also unable to swallow any pills. He had the procedure Friday and did fine. He was told to go home with a soft diet and he was able to eat and drink that night. He ate breakfast ok Saturday but he wasn't able to eat after that. Dr. Shirlee More that on in-office exam the vocal cords are clear without swelling or infection but the patient was unable to swallow water in his office. He recommends IVF, labs, modified barium swallow evaluationfor esophageal  obstruction/aspiration.      SLP Plan  Continue with current plan of care       Recommendations  Diet recommendations: Dysphagia 1 (puree);Honey-thick liquid Liquids provided via: Cup Medication Administration: Via alternative means Supervision: Intermittent supervision to cue for compensatory strategies Compensations: Multiple dry swallows after each bite/sip;Effortful swallow;Other (Comment) Postural Changes and/or Swallow Maneuvers: Seated upright 90 degrees                Oral Care Recommendations: Oral care QID Follow up Recommendations: Other (comment) SLP Visit Diagnosis: Dysphagia, pharyngoesophageal phase (R13.14) Plan: Continue with current plan of care       GO               [facility], [place] CCC-SLP [phone]  Lynann Beaver 02/20/2018, 10:38 AM

## 2018-02-21 LAB — CBC
HCT: 39.1 % (ref 39.0–52.0)
Hemoglobin: 12 g/dL — ABNORMAL LOW (ref 13.0–17.0)
MCH: 26.3 pg (ref 26.0–34.0)
MCHC: 30.7 g/dL (ref 30.0–36.0)
MCV: 85.6 fL (ref 78.0–100.0)
PLATELETS: 274 10*3/uL (ref 150–400)
RBC: 4.57 MIL/uL (ref 4.22–5.81)
RDW: 20.9 % — ABNORMAL HIGH (ref 11.5–15.5)
WBC: 20.4 10*3/uL — ABNORMAL HIGH (ref 4.0–10.5)

## 2018-02-21 LAB — GLUCOSE, CAPILLARY
Glucose-Capillary: 111 mg/dL — ABNORMAL HIGH (ref 65–99)
Glucose-Capillary: 112 mg/dL — ABNORMAL HIGH (ref 65–99)
Glucose-Capillary: 118 mg/dL — ABNORMAL HIGH (ref 65–99)
Glucose-Capillary: 119 mg/dL — ABNORMAL HIGH (ref 65–99)
Glucose-Capillary: 147 mg/dL — ABNORMAL HIGH (ref 65–99)
Glucose-Capillary: 151 mg/dL — ABNORMAL HIGH (ref 65–99)

## 2018-02-21 MED ORDER — FUROSEMIDE 10 MG/ML IJ SOLN
40.0000 mg | Freq: Once | INTRAMUSCULAR | Status: AC
  Administered 2018-02-21: 40 mg via INTRAVENOUS
  Filled 2018-02-21: qty 4

## 2018-02-21 NOTE — Progress Notes (Signed)
Nutrition Follow-up  DOCUMENTATION CODES:   Severe malnutrition in context of acute illness/injury  INTERVENTION:   -Continue Jevity 1.2 @ 23m/hr via NGT  Continue 30 ml Prostat BID.    Continue 200 ml free water flush QID  Tube feeding regimen provides 2072 kcal (100% of needs), 117 grams of protein, and 1264 ml of H2O. (Total free water: 2064 ml)  NUTRITION DIAGNOSIS:   Severe Malnutrition related to acute illness(recent dysphagia) as evidenced by moderate muscle depletion, percent weight loss, energy intake < or equal to 50% for > or equal to 5 days(5.5% wt loss in one month).  Ongoing  GOAL:   Patient will meet greater than or equal to 90% of their needs  Met with TF  MONITOR:   PO intake, Diet advancement, Labs, Weight trends, TF tolerance, Skin, I & O's  REASON FOR ASSESSMENT:   Malnutrition Screening Tool, Consult Enteral/tube feeding initiation and management  ASSESSMENT:   82y.o. 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 S/p vocal cord injection in OR on 3/15. Admitted from ENT with concern for dehydration and dysphagia. Pt with difficulty swallowing.  3/20- NGT placed under fluoroscopy, TF initiated 3/24- s/p MBSS- advanced to dysphagia 1 diet with honey thick liquids  Pt sleeping soundly in recliner chair at time of visit. Meal completion poor (PO: 0% per doc flowsheets). Pt remains with NGT; Jevity 1.2 infusing at goal rate of 65 ml/hr (alsp receiving 30 ml Prostat BID and 200 ml free water flushes QID). Complete regimen providing 2072 kcal (100% of needs), 117 grams of protein, and 1264 ml of H2O. (Total free water: 2064 ml).   Per MD notes, continue to carefully monitor PO intake.   Labs reviewed: CBGS: 119-151.  Diet Order:  DIET - DYS 1 Room service appropriate? Yes; Fluid consistency: Honey Thick  EDUCATION NEEDS:   No education  needs have been identified at this time  Skin:  Skin Assessment: Skin Integrity Issues: Skin Integrity Issues:: Incisions, Other (Comment) Incisions: lip Other: skin tear R leg  Last BM:  02/21/18  Height:   Ht Readings from Last 1 Encounters:  02/14/18 '5\' 9"'  (1.753 m)    Weight:   Wt Readings from Last 1 Encounters:  02/18/18 196 lb 6.9 oz (89.1 kg)    Ideal Body Weight:  72.7 kg  BMI:  Body mass index is 29.01 kg/m.  Estimated Nutritional Needs:   Kcal:  1850-2050  Protein:  105-115  Fluid:  >1.8L    John Sampson A. WJimmye Norman RD, LDN, CDE Pager: [phone] hours Pager: [phone]

## 2018-02-21 NOTE — Progress Notes (Signed)
PROGRESS NOTE    BOOMER WINDERS  GHW:[id number] DOB: [birth year] DOA: 02/14/2018 PCP: Sinda Du, MD  Brief Narrative:John Sampson is a 82 y.o. 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  He presented to ENT with increasing SOB and hoarseness found to have VOcal cord paralysis.  He was taken to the OR on 3/15 by Dr. Lucia Gaskins and underwent prolaryn injection to vocal cords, then presented 3/19  to Dr. Pollie Friar office and there was concern for dehydration and dysphagia so he was sent to [facility] for direct admission.  Since the procedure, he has been able to have any solid or liquid food - it just won't go down or it goes down and comes right back up.  Also unable to swallow any pills.  Modified Barium swallow 3/20 with severe cervical esophageal swelling with resultant dysphagia with obstructing tissue at C5/6 and C7. Pt is unable to pass 1/4 teaspoon of thin liquids despite maximal effortful swallow  -3/20 started steroids and Abx, Cortrak placed for tube feeds  Assessment & Plan:   Severe Cervical esophageal swelling with dysphagia --Status post prolaryn injection to vocal cords 3/15 for vocal cord paralysis, with resultant profound dysphagia, MBS 3/20 which noted Severe cervical esophageal swelling with resultant dysphagia with obstructing tissue at C5/6 and C7. -ENT, Dr.Newman following, clinically improving very slowly on Abx and steroids - continue IV Decadron cut down decadron dose and changed Unasyn to Zosyn given persistent severe leukocytosis and possible aspiration -Small cortrak placed under fluoroscopy for nutrition and meds, tolerating TFs -status post repeat barium swallow on 3/24 with some clinical improvement, started on dysphagia 1 diet and honey thick liquids 3/24, not enough to maintain adequate nutrition, continue core track and tube feeds for a few more days,  continue to monitor swallowing closely   leukocytosis -WBC remains up, my concern is for aspiration pneumonitis, although CXR negtaive -hence I changed Unasyn to Zosyn on 3/25 -steroids likely contributing as well -monitor clinically  P.Afib on Xarelto -in NSR, and HR controlled -hold Xarelto, continue full dose lovenox for now -PO metoprolol and Diltiazem on hold -continue IV lopressor   H/o Hypothyroidism -currently hyperthyroid by labs, oversupressed -recheck levels and resume synthroid at very small dose in 4-6weeks  HTN -unable to tolerate PO, IV metoprolol and when necessary hydralazine for now  HLD -Hold Zocor   Acute on Chronic diastolic heart failure -diuresed with IV lasix 3/25, will give another dose of IV lasix today -monitor VOlume status closely  R leg abrasion/skin tear -no s/s of infection, will ask Woc for recs  DVT prophylaxis: Treatment-dose Lovenox Code Status:  DNR  Family Communication: no family at bedside, called and updated son Tasheem Elms 3/23 Disposition Plan:  SNF when swallowing improved  Consults  - ENT  -SLP  Procedures: MBS x2  Antimicrobials:    Subjective: -feels a little better, thinks swallowing may be improving  Objective: Vitals:   02/20/18 1258 02/20/18 2048 02/21/18 0500 02/21/18 1233  BP: (!) 145/57 (!) 140/55 (!) 149/48 (!) 146/47  Pulse: 67 (!) 55 (!) 59   Resp: 19 19 20    Temp: 98.2 F (36.8 C) 98.3 F (36.8 C) 97.6 F (36.4 C)   TempSrc: Oral Oral Axillary   SpO2: 97% 99% 100%   Weight:      Height:        Intake/Output Summary (Last 24 hours) at 02/21/2018  1256 Last data filed at 02/21/2018 1100 Gross per 24 hour  Intake 501 ml  Output 875 ml  Net -374 ml   Filed Weights   02/16/18 2008 02/17/18 0432 02/18/18 0443  Weight: 87.9 kg (193 lb 12.6 oz) 88.2 kg (194 lb 7.1 oz) 89.1 kg (196 lb 6.9 oz)    Examination: Gen: Awake, Alert, Oriented X 3, Elderly, chronically ill male, sitting in  bed HEENT: PERRLA, Neck supple, no JVD Lungs: with bibasilar crackles CVS: RRR,No Gallops,Rubs or new Murmurs Abd: soft, Non tender, non distended, BS present Extremities: 1+ edema Skin: no new rashes Psychiatry: Judgement and insight appear normal. Mood & affect appropriate.     Data Reviewed:   CBC: Recent Labs  Lab 02/14/18 1601  02/16/18 0801 02/18/18 8850 02/19/18 0601 02/20/18 0512 02/21/18 1037  WBC 9.2   < > 10.2 20.6* 19.6* 21.8* 20.4*  NEUTROABS 7.1  --   --   --   --   --   --   HGB 11.2*   < > 11.5* 12.4* 12.5* 12.6* 12.0*  HCT 36.5*   < > 37.6* 41.2 40.6 40.6 39.1  MCV 83.7   < > 83.0 85.3 84.4 84.6 85.6  PLT 285   < > 312 346 318 325 274   < > = values in this interval not displayed.   Basic Metabolic Panel: Recent Labs  Lab 02/15/18 0552 02/16/18 0938 02/18/18 0621 02/19/18 0601 02/20/18 0512  NA 140 138 137 137 134*  K 4.2 3.5 4.3 4.5 5.0  CL 106 107 100* 101 99*  CO2 22 21* 26 27 25   GLUCOSE 78 143* 130* 136* 142*  BUN 23* 23* 28* 37* 49*  CREATININE 0.81 0.73 0.78 0.84 0.92  CALCIUM 8.9 9.2 9.1 9.2 8.7*   GFR: Estimated Creatinine Clearance: 70.7 mL/min (by C-G formula based on SCr of 0.92 mg/dL). Liver Function Tests: Recent Labs  Lab 02/14/18 1601  AST 30  ALT 22  ALKPHOS 62  BILITOT 1.3*  PROT 6.7  ALBUMIN 3.6   No results for input(s): LIPASE, AMYLASE in the last 168 hours. No results for input(s): AMMONIA in the last 168 hours. Coagulation Profile: No results for input(s): INR, PROTIME in the last 168 hours. Cardiac Enzymes: No results for input(s): CKTOTAL, CKMB, CKMBINDEX, TROPONINI in the last 168 hours. BNP (last 3 results) No results for input(s): PROBNP in the last 8760 hours. HbA1C: No results for input(s): HGBA1C in the last 72 hours. CBG: Recent Labs  Lab 02/20/18 2031 02/20/18 2351 02/21/18 0410 02/21/18 0837 02/21/18 1232  GLUCAP 108* 133* 151* 147* 119*   Lipid Profile: No results for input(s): CHOL,  HDL, LDLCALC, TRIG, CHOLHDL, LDLDIRECT in the last 72 hours. Thyroid Function Tests: No results for input(s): TSH, T4TOTAL, FREET4, T3FREE, THYROIDAB in the last 72 hours. Anemia Panel: No results for input(s): VITAMINB12, FOLATE, FERRITIN, TIBC, IRON, RETICCTPCT in the last 72 hours. Urine analysis:    Component Value Date/Time   COLORURINE YELLOW 12/06/2017 1215   APPEARANCEUR HAZY (A) 12/06/2017 1215   LABSPEC 1.021 12/06/2017 1215   PHURINE 5.0 12/06/2017 1215   GLUCOSEU NEGATIVE 12/06/2017 1215   HGBUR LARGE (A) 12/06/2017 1215   BILIRUBINUR NEGATIVE 12/06/2017 1215   KETONESUR NEGATIVE 12/06/2017 1215   PROTEINUR 30 (A) 12/06/2017 1215   NITRITE NEGATIVE 12/06/2017 1215   LEUKOCYTESUR TRACE (A) 12/06/2017 1215   Sepsis Labs: @LABRCNTIP (procalcitonin:4,lacticidven:4)  )No results found for this or any previous visit (from the past 240  hour(s)).       Radiology Studies: No results found.      Scheduled Meds: . dexamethasone  4 mg Intravenous Q24H  . enoxaparin (LOVENOX) injection  1 mg/kg Subcutaneous Q12H  . feeding supplement (PRO-STAT SUGAR FREE 64)  30 mL Per Tube BID  . free water  200 mL Per Tube QID  . mouth rinse  15 mL Mouth Rinse BID  . metoprolol tartrate  5 mg Intravenous Q6H  .  morphine injection  1 mg Intravenous Once  . mupirocin ointment   Topical Daily   Continuous Infusions: . feeding supplement (JEVITY 1.2 CAL) 1,000 mL (02/21/18 1233)  . piperacillin-tazobactam (ZOSYN)  IV Stopped (02/21/18 0950)     LOS: 6 days    Time spent:48min    Domenic Polite, MD [facility] Page via [email or web address], password [email or web address] After 7PM please contact night-coverage  02/21/2018, 12:56 PM

## 2018-02-21 NOTE — Care Management Important Message (Signed)
Important Message  Patient Details  Name: John Sampson MRN: [id number] Date of Birth: 1936-02-12   Medicare Important Message Given:  Yes    Barkley Kratochvil [address] 02/21/2018, 9:23 AM

## 2018-02-21 NOTE — Evaluation (Signed)
Physical Therapy Evaluation Patient Details Name: John Sampson MRN: [id number] DOB: Feb 16, 1936 Today's Date: 02/21/2018   History of Present Illness  John Sampson is a 82 y.o. 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  Also with total thyroidectomy and laryngoplasty.  He presented to ENT with increasing SOB and hoarseness.  He was taken to the OR on 3/15 by Dr. Lucia Gaskins for injection of left vocal cord to strengthen his voice.   He presented today to Dr. Pollie Friar office and there was concern for dehydration and dysphagia so he was sent to [facility]. Went home after this but was still unable to swallow so returned and now has NG tube.   Clinical Impression  Pt admitted with above diagnosis. Pt currently with functional limitations due to the deficits listed below (see PT Problem List). Pt very weak and fatigued with mobility. Was able to ambulate 25' with RW and min A but needed 3 standing rest breaks.  Pt will benefit from skilled PT to increase their independence and safety with mobility to allow discharge to the venue listed below.       Follow Up Recommendations SNF    Equipment Recommendations  Rolling walker with 5" wheels    Recommendations for Other Services       Precautions / Restrictions Precautions Precautions: Fall Precaution Comments: has been in bed past week Restrictions Weight Bearing Restrictions: No Other Position/Activity Restrictions: NG tube      Mobility  Bed Mobility               General bed mobility comments: received in chair  Transfers Overall transfer level: Needs assistance Equipment used: Rolling walker (2 wheeled) Transfers: Sit to/from Stand Sit to Stand: Min assist         General transfer comment: min A to steady  Ambulation/Gait Ambulation/Gait assistance: Min assist Ambulation Distance (Feet): 25 Feet Assistive device: Rolling walker (2 wheeled) Gait  Pattern/deviations: Step-through pattern;Shuffle;Trunk flexed Gait velocity: decreased Gait velocity interpretation: <1.8 ft/sec, indicative of risk for recurrent falls General Gait Details: pt fatigued very quickly, took 3 standing rest breaks in 25'. O2 sats maintained 91% on RA  Stairs            Wheelchair Mobility    Modified Rankin (Stroke Patients Only)       Balance Overall balance assessment: Needs assistance Sitting-balance support: Single extremity supported Sitting balance-Leahy Scale: Fair     Standing balance support: Bilateral upper extremity supported Standing balance-Leahy Scale: Poor Standing balance comment: reliant on UE support                             Pertinent Vitals/Pain Pain Assessment: Faces Faces Pain Scale: Hurts a little bit Pain Location: L lower leg, dressing noted Pain Descriptors / Indicators: Sore Pain Intervention(s): Limited activity within patient's tolerance;Monitored during session    Home Living Family/patient expects to be discharged to:: Private residence Living Arrangements: Alone Available Help at Discharge: Other (Comment)(pt reports none) Type of Home: House Home Access: Stairs to enter Entrance Stairs-Rails: None Entrance Stairs-Number of Steps: 1 Home Layout: One level Home Equipment: None      Prior Function Level of Independence: Independent         Comments: pt has been caring for self including driving and cooking     Hand Dominance        Extremity/Trunk Assessment  Upper Extremity Assessment Upper Extremity Assessment: Generalized weakness    Lower Extremity Assessment Lower Extremity Assessment: Generalized weakness    Cervical / Trunk Assessment Cervical / Trunk Assessment: Kyphotic  Communication   Communication: Other (comment)(love voice quality)  Cognition Arousal/Alertness: Awake/alert Behavior During Therapy: WFL for tasks assessed/performed Overall Cognitive  Status: Within Functional Limits for tasks assessed                                 General Comments: pt reports that he has been "jumpy" since admission      General Comments General comments (skin integrity, edema, etc.): O2 sats 98% on 2L, 95% on RA without exertion, 91% with exertion. 2L O2 replaced after session    Exercises General Exercises - Lower Extremity Ankle Circles/Pumps: AROM;Both;20 reps;Supine Heel Slides: AROM;Both;5 reps;Seated   Assessment/Plan    PT Assessment Patient needs continued PT services  PT Problem List Decreased strength;Decreased activity tolerance;Decreased balance;Decreased mobility;Decreased knowledge of use of DME;Decreased knowledge of precautions;Pain       PT Treatment Interventions DME instruction;Gait training;Stair training;Functional mobility training;Therapeutic activities;Therapeutic exercise;Balance training;Patient/family education    PT Goals (Current goals can be found in the Care Plan section)  Acute Rehab PT Goals Patient Stated Goal: agreeable to SNF to get stronger if he needs it but would rather go home PT Goal Formulation: With patient Time For Goal Achievement: 03/07/18 Potential to Achieve Goals: Good    Frequency Min 3X/week   Barriers to discharge Decreased caregiver support      Co-evaluation               AM-PAC PT "6 Clicks" Daily Activity  Outcome Measure Difficulty turning over in bed (including adjusting bedclothes, sheets and blankets)?: A Little Difficulty moving from lying on back to sitting on the side of the bed? : A Little Difficulty sitting down on and standing up from a chair with arms (e.g., wheelchair, bedside commode, etc,.)?: A Little Help needed moving to and from a bed to chair (including a wheelchair)?: A Little Help needed walking in hospital room?: A Little Help needed climbing 3-5 steps with a railing? : A Lot 6 Click Score: 17    End of Session Equipment Utilized  During Treatment: Gait belt Activity Tolerance: Patient tolerated treatment well;Patient limited by fatigue Patient left: in chair;with call bell/phone within reach Nurse Communication: Mobility status PT Visit Diagnosis: Unsteadiness on feet (R26.81);Muscle weakness (generalized) (M62.81);Pain;Difficulty in walking, not elsewhere classified (R26.2) Pain - Right/Left: Left Pain - part of body: Leg    Time: 6378-5885 PT Time Calculation (min) (ACUTE ONLY): 18 min   Charges:   PT Evaluation $PT Eval Moderate Complexity: 1 Mod     PT G Codes:        Leighton Roach, PT  Acute Rehab Services  [place] 02/21/2018, 12:07 PM

## 2018-02-22 ENCOUNTER — Inpatient Hospital Stay (HOSPITAL_COMMUNITY): Payer: Medicare Other

## 2018-02-22 ENCOUNTER — Encounter (HOSPITAL_COMMUNITY): Payer: Self-pay | Admitting: *Deleted

## 2018-02-22 LAB — GLUCOSE, CAPILLARY
GLUCOSE-CAPILLARY: 149 mg/dL — AB (ref 65–99)
GLUCOSE-CAPILLARY: 130 mg/dL — AB (ref 65–99)
GLUCOSE-CAPILLARY: 89 mg/dL (ref 65–99)
Glucose-Capillary: 124 mg/dL — ABNORMAL HIGH (ref 65–99)
Glucose-Capillary: 92 mg/dL (ref 65–99)

## 2018-02-22 LAB — BASIC METABOLIC PANEL
Anion gap: 8 (ref 5–15)
BUN: 48 mg/dL — ABNORMAL HIGH (ref 6–20)
CHLORIDE: 96 mmol/L — AB (ref 101–111)
CO2: 30 mmol/L (ref 22–32)
Calcium: 8.7 mg/dL — ABNORMAL LOW (ref 8.9–10.3)
Creatinine, Ser: 1.03 mg/dL (ref 0.61–1.24)
GFR calc Af Amer: >60 mL/min (ref >60)
GFR calc non Af Amer: >60 mL/min (ref >60)
Glucose, Bld: 149 mg/dL — ABNORMAL HIGH (ref 65–99)
POTASSIUM: 5.1 mmol/L (ref 3.5–5.1)
SODIUM: 134 mmol/L — AB (ref 135–145)

## 2018-02-22 LAB — CBC
HEMATOCRIT: 39.4 % (ref 39.0–52.0)
HEMOGLOBIN: 11.8 g/dL — AB (ref 13.0–17.0)
MCH: 25.4 pg — ABNORMAL LOW (ref 26.0–34.0)
MCHC: 29.9 g/dL — ABNORMAL LOW (ref 30.0–36.0)
MCV: 84.7 fL (ref 78.0–100.0)
Platelets: 263 10*3/uL (ref 150–400)
RBC: 4.65 MIL/uL (ref 4.22–5.81)
RDW: 20.7 % — ABNORMAL HIGH (ref 11.5–15.5)
WBC: 22.8 10*3/uL — ABNORMAL HIGH (ref 4.0–10.5)

## 2018-02-22 MED ORDER — PIPERACILLIN-TAZOBACTAM 3.375 G IVPB
3.3750 g | Freq: Three times a day (TID) | INTRAVENOUS | Status: AC
  Administered 2018-02-22 – 2018-02-25 (×10): 3.375 g via INTRAVENOUS
  Filled 2018-02-22 (×10): qty 50

## 2018-02-22 MED ORDER — SODIUM CHLORIDE 0.9% FLUSH
10.0000 mL | INTRAVENOUS | Status: DC | PRN
  Administered 2018-02-28: 10 mL
  Filled 2018-02-22 (×2): qty 40

## 2018-02-22 MED ORDER — FUROSEMIDE 10 MG/ML IJ SOLN
40.0000 mg | Freq: Once | INTRAMUSCULAR | Status: AC
  Administered 2018-02-22: 40 mg via INTRAVENOUS
  Filled 2018-02-22: qty 4

## 2018-02-22 MED ORDER — IOPAMIDOL (ISOVUE-300) INJECTION 61%
INTRAVENOUS | Status: AC
  Administered 2018-02-22: 75 mL
  Filled 2018-02-22: qty 75

## 2018-02-22 NOTE — Progress Notes (Signed)
PROGRESS NOTE    John Sampson  KKX:[id number] DOB: May 16, 1936 DOA: 02/14/2018 PCP: Sinda Du, MD  Brief Narrative:John Sampson is a 82 y.o. 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  He presented to ENT with increasing SOB and hoarseness found to have VOcal cord paralysis.  He was taken to the OR on 3/15 by Dr. Lucia Gaskins and underwent prolaryn injection to vocal cords, then presented 3/19  to Dr. Pollie Friar office and there was concern for dehydration and dysphagia so he was sent to [facility] for direct admission.  Since the procedure, he has been able to have any solid or liquid food - it just won't go down or it goes down and comes right back up.  Also unable to swallow any pills.  Modified Barium swallow 3/20 with severe cervical esophageal swelling with resultant dysphagia with obstructing tissue at C5/6 and C7. Pt is unable to pass 1/4 teaspoon of thin liquids despite maximal effortful swallow  -3/20 started steroids and Abx, Cortrak placed for tube feeds  Assessment & Plan:   Severe Cervical esophageal swelling with dysphagia --Status post prolaryn injection to vocal cords 3/15 for vocal cord paralysis, with resultant profound dysphagia, MBS 3/20 which noted Severe cervical esophageal swelling with resultant dysphagia with obstructing tissue at C5/6 and C7. -ENT, Dr.Newman following, mild improvement only on Abx and steroids, s/p 7days of IV decadron now, stop this steroids today no signif improvement in swelling on MBS 3/27 -continue Zosyn, im concerned abt Aspiration pneumonitis, although CXR negative -check CT neck today -ENT following -D1 diet with thickened liquids   leukocytosis -WBC remains up, my concern is for aspiration pneumonitis, although CXR negtaive -hence I changed Unasyn to Zosyn on 3/25 -steroids likely contributing as well -monitor clinically -stop  decadron today  P.Afib on Xarelto -in NSR, and HR controlled -hold Xarelto, continue full dose lovenox for now -PO metoprolol and Diltiazem on hold -continue IV lopressor   H/o Hypothyroidism -currently hyperthyroid by labs, oversupressed -recheck levels and resume synthroid at very small dose in 4-6weeks  HTN -unable to tolerate PO, IV metoprolol and when necessary hydralazine for now  HLD -Hold Zocor   Acute on Chronic diastolic heart failure -diuresed with IV lasix 3/25, and 3/26 -monitor VOlume status closely -check CXR  R leg abrasion/skin tear -no s/s of infection, [facility] consult appreciated  DVT prophylaxis: Treatment-dose Lovenox Code Status:  DNR  Family Communication: no family at bedside, called and updated son John Sampson 3/23 Disposition Plan:  SNF when swallowing improved  Consults  - ENT  -SLP  Procedures: MBS x2  Antimicrobials:    Subjective: -just back from MBS, feeling a little better Objective: Vitals:   02/22/18 0025 02/22/18 0410 02/22/18 0415 02/22/18 1115  BP: (!) 143/58 (!) 153/51  (!) 143/51  Pulse: (!) 53 62  62  Resp:  17    Temp:  (!) 97.3 F (36.3 C)    TempSrc:  Oral    SpO2:  96%  96%  Weight:   88.6 kg (195 lb 5.2 oz)   Height:        Intake/Output Summary (Last 24 hours) at 02/22/2018 1355 Last data filed at 02/22/2018 0934 Gross per 24 hour  Intake 1762.5 ml  Output 2550 ml  Net -787.5 ml   Filed Weights   02/17/18 0432 02/18/18 0443 02/22/18 0415  Weight: 88.2 kg (194 lb 7.1 oz)  89.1 kg (196 lb 6.9 oz) 88.6 kg (195 lb 5.2 oz)    Examination: Gen: Awake, Alert, Oriented X 2, elderly frail male, sitting in chair HEENT: PERRLA, Neck supple, no JVD Lungs: few ronchi in R base CVS: S1S2/RRR Abd: soft, Non tender, non distended, BS present Extremities: 1plus edema Skin: no new rashes Psychiatry: Judgement and insight appear normal. Mood & affect appropriate.     Data Reviewed:   CBC: Recent Labs  Lab  02/18/18 0621 02/19/18 0601 02/20/18 0512 02/21/18 1037 02/22/18 0425  WBC 20.6* 19.6* 21.8* 20.4* 22.8*  HGB 12.4* 12.5* 12.6* 12.0* 11.8*  HCT 41.2 40.6 40.6 39.1 39.4  MCV 85.3 84.4 84.6 85.6 84.7  PLT 346 318 325 274 956   Basic Metabolic Panel: Recent Labs  Lab 02/16/18 0938 02/18/18 0621 02/19/18 0601 02/20/18 0512 02/22/18 0425  NA 138 137 137 134* 134*  K 3.5 4.3 4.5 5.0 5.1  CL 107 100* 101 99* 96*  CO2 21* 26 27 25 30   GLUCOSE 143* 130* 136* 142* 149*  BUN 23* 28* 37* 49* 48*  CREATININE 0.73 0.78 0.84 0.92 1.03  CALCIUM 9.2 9.1 9.2 8.7* 8.7*   GFR: Estimated Creatinine Clearance: 63 mL/min (by C-G formula based on SCr of 1.03 mg/dL). Liver Function Tests: No results for input(s): AST, ALT, ALKPHOS, BILITOT, PROT, ALBUMIN in the last 168 hours. No results for input(s): LIPASE, AMYLASE in the last 168 hours. No results for input(s): AMMONIA in the last 168 hours. Coagulation Profile: No results for input(s): INR, PROTIME in the last 168 hours. Cardiac Enzymes: No results for input(s): CKTOTAL, CKMB, CKMBINDEX, TROPONINI in the last 168 hours. BNP (last 3 results) No results for input(s): PROBNP in the last 8760 hours. HbA1C: No results for input(s): HGBA1C in the last 72 hours. CBG: Recent Labs  Lab 02/21/18 2052 02/21/18 2358 02/22/18 0407 02/22/18 0737 02/22/18 1141  GLUCAP 112* 118* 149* 130* 124*   Lipid Profile: No results for input(s): CHOL, HDL, LDLCALC, TRIG, CHOLHDL, LDLDIRECT in the last 72 hours. Thyroid Function Tests: No results for input(s): TSH, T4TOTAL, FREET4, T3FREE, THYROIDAB in the last 72 hours. Anemia Panel: No results for input(s): VITAMINB12, FOLATE, FERRITIN, TIBC, IRON, RETICCTPCT in the last 72 hours. Urine analysis:    Component Value Date/Time   COLORURINE YELLOW 12/06/2017 1215   APPEARANCEUR HAZY (A) 12/06/2017 1215   LABSPEC 1.021 12/06/2017 1215   PHURINE 5.0 12/06/2017 1215   GLUCOSEU NEGATIVE 12/06/2017 1215    HGBUR LARGE (A) 12/06/2017 1215   BILIRUBINUR NEGATIVE 12/06/2017 1215   KETONESUR NEGATIVE 12/06/2017 1215   PROTEINUR 30 (A) 12/06/2017 1215   NITRITE NEGATIVE 12/06/2017 1215   LEUKOCYTESUR TRACE (A) 12/06/2017 1215   Sepsis Labs: @LABRCNTIP (procalcitonin:4,lacticidven:4)  )No results found for this or any previous visit (from the past 240 hour(s)).       Radiology Studies: No results found.      Scheduled Meds: . enoxaparin (LOVENOX) injection  1 mg/kg Subcutaneous Q12H  . feeding supplement (PRO-STAT SUGAR FREE 64)  30 mL Per Tube BID  . free water  200 mL Per Tube QID  . mouth rinse  15 mL Mouth Rinse BID  . metoprolol tartrate  5 mg Intravenous Q6H  .  morphine injection  1 mg Intravenous Once  . mupirocin ointment   Topical Daily   Continuous Infusions: . feeding supplement (JEVITY 1.2 CAL) 1,000 mL (02/22/18 0326)  . piperacillin-tazobactam (ZOSYN)  IV Stopped (02/22/18 2130)     LOS:  7 days    Time spent:8min    Domenic Polite, MD [facility] Page via [email or web address], password [email or web address] After 7PM please contact night-coverage  02/22/2018, 1:55 PM

## 2018-02-22 NOTE — Clinical Social Work Note (Signed)
Clinical Social Work Assessment  Patient Details  Name: John Sampson MRN: [id number] Date of Birth: 07/23/1936  Date of referral:  02/22/18               Reason for consult:  Facility Placement                Permission sought to share information with:  Family Supports, Customer service manager Permission granted to share information::  Yes, Verbal Permission Granted  Name::     Markeem Noreen  Agency::  SNFs; Preference for [facility]  Relationship::  son  Contact Information:  [phone]  Housing/Transportation Living arrangements for the past 2 months:  Single Family Home Source of Information:  Patient Patient Interpreter Needed:  None Criminal Activity/Legal Involvement Pertinent to Current Situation/Hospitalization:  No - Comment as needed Significant Relationships:  Adult Children, Other Family Members Lives with:  Self Do you feel safe going back to the place where you live?  No Need for family participation in patient care:  Yes (Comment)(pt expressed he does not feel he can be alone)  Care giving concerns:  Pt states that he lives alone in [place], he has a son in [place] and a daughter in [place]. Pt states he doesn't feel he can be alone, SNF recommended.    Social Worker assessment / plan:  CSW met with pt at bedside, pt sitting in chair and appears weak and tired. Pt states that he lives alone in a home in [place] and that he does not have any supervision during the day/evening. He has good support from son that lives in [place] and a daughter in [place]. When asked pt states that he knows they are recommending rehab and that "I need it, I can't be alone." When asked pt further states "I can't be alone in the future, ever." Pt states "I feel weak." CSW explained role and SNF process, pt states that he has never been to a rehab before but has heard about [facility]. CSW asked if pt had expressed concern about living alone to his son, he states he  hasn't but that he would. CSW also talked with pt regarding Medicaid if he felt he could not privately pay for more care in future at home or another facility following SNF. CSW continuing to follow.   Employment status:  Retired Forensic scientist:  [employer] PT Recommendations:  [place], [place] / Referral to community resources:  [place]  Patient/Family's Response to care:  Pt states understanding of CSW role, SNF recommendation and discharge planning efforts as diet continues to advance.   Patient/Family's Understanding of and Emotional Response to Diagnosis, Current Treatment, and Prognosis:  Pt states understanding of diagnosis, current treatment and prognosis. Pt very realistic regarding needs for therapy and supervision, pt did appear tired and sad during CSW assessment. CSW provided validation for pt and emotional support as we plan for next steps.   Emotional Assessment Appearance:  Appears stated age Attitude/Demeanor/Rapport:  Lethargic(Accepting; Quiet) Affect (typically observed):  Accepting, Calm, Flat, Quiet Orientation:  Oriented to Self, Oriented to Place, Oriented to  Time, Oriented to Situation Alcohol / Substance use:  Not Applicable Psych involvement (Current and /or in the community):  No (Comment)  Discharge Needs  Concerns to be addressed:  Care Coordination, Discharge Planning Concerns Readmission within the last 30 days:  No Current discharge risk:  Lives alone, Physical Impairment, Dependent with Mobility Barriers to Discharge:  Continued Medical Work  up   Alexander Mt, [place] 02/22/2018, 11:46 AM

## 2018-02-22 NOTE — Social Work (Addendum)
CSW acknowledging consult for SNF placement, pt will have to have cortrack removed prior to discharge to SNF.    10:15am- CSW stopped by pt room to complete assessment, pt off floor at another procedure. Will f/u this afternoon.   CSW to follow and support disposition when medically appropriate.  Alexander Mt, [place] Work [phone]

## 2018-02-22 NOTE — Progress Notes (Signed)
Pt reports feeling of swallowing much better. Will proceed with MBS to determine readiness for diet advancement or ability to remove NG tube to assist in plan of care. Pt in agreement.  Herbie Baltimore, [place] CCC-SLP [phone]

## 2018-02-22 NOTE — Progress Notes (Signed)
Modified Barium Swallow Progress Note  Patient Details  Name: John Sampson MRN: [id number] Date of Birth: Sep 15, 1936  Today's Date: 02/22/2018  Modified Barium Swallow completed.  Full report located under Chart Review in the Imaging Section.  Brief recommendations include the following:  Clinical Impression  Pt demonstrates no significant improvement in cervical esophageal opening from prior MBS, though pt has become more efficient at use of strategies to transit boluses to the best of his ability. There is still severe residual remaining after initial swallow of nectar and thin liquids with 3-4 swallows with a head turn left needed to fully transit the bolus. This residue leads to penetration and aspiration events as pharyngeal pressure during further swallows pushes residue through partially open vestibule due to left vocal fold paralysis. There is mild penetration of nectar but more significant aspiration of thin. There are more moderate to severe residuals with puree textures which require a liquid wash to clear. Overall, pt should attempt to consume oral diet of nectar and puree and decrease reliance on tube feeding to see if oral diet can be tolerated and NG tube could be potentially removed. Discussed with pt and MD and reitnerated importance of compensatory positions to pt.    Swallow Evaluation Recommendations   Recommended Consults: Consider ENT evaluation   SLP Diet Recommendations: Dysphagia 1 (Puree) solids;Nectar thick liquid   Liquid Administration via: Cup   Medication Administration: Crushed with puree   Supervision: Patient able to self feed   Compensations: Multiple dry swallows after each bite/sip;Effortful swallow;Other (Comment);Clear throat intermittently       Oral Care Recommendations: Oral care before and after PO   Other Recommendations: Order thickener from pharmacy;Prohibited food (jello, ice cream, thin soups);Remove water pitcher;Have oral suction  available   Herbie Baltimore, MA CCC-SLP [phone]  Lynann Beaver 02/22/2018,11:32 AM

## 2018-02-22 NOTE — NC FL2 (Signed)
[place] LEVEL OF CARE SCREENING TOOL     IDENTIFICATION  Patient Name: John Sampson Birthdate: September 23, 1936 Sex: male Admission Date (Current Location): 02/14/2018  [facility] and [place] Number:  [employer] and Address:  [facility], [address], [place], [place] [address]      Provider Number: [id number]  Attending Physician Name and Address:  Domenic Polite, MD  Relative Name and Phone Number:  Korby Ratay; [phone]; son    Current Level of Care: Hospital Recommended Level of Care: [place] Prior Approval Number:    Date Approved/Denied:   PASRR Number: [id number]   Discharge Plan: SNF    Current Diagnoses: Patient Active Problem List   Diagnosis Date Noted  . Protein-calorie malnutrition, severe 02/17/2018  . Dysphagia 02/14/2018  . Hypothyroidism (acquired) 02/14/2018  . Abrasion of lower leg, left, initial encounter 02/14/2018  . Preoperative clearance 01/11/2018  . Bilateral carotid bruits 01/11/2018  . CAD (coronary artery disease) 01/10/2018  . Iron deficiency anemia due to chronic blood loss 08/24/2017  . GI bleeding 03/31/2017  . GI bleed 03/30/2017  . Thyroid cancer ([place]) 09/14/2016  . HCAP (healthcare-associated pneumonia) 08/24/2016  . Pneumonia 08/23/2016  . Chronic diastolic congestive heart failure ([place]) 07/24/2016  . Cellulitis of right arm 07/20/2016  . Cellulitis and abscess of hand 07/20/2016  . Venous stasis ulcers ([place]) 12/24/2015  . Chronic venous insufficiency 12/24/2015  . Atherosclerosis of extremity with ulceration ([place]) 12/24/2015  . CHF (congestive heart failure) ([place]) 12/24/2015  . Critical lower limb ischemia 12/24/2015  . Obesity 12/24/2015  . PAF (paroxysmal atrial fibrillation) ([place]) 12/24/2015  . PAD (peripheral artery disease) ([place]) 06/07/2014  . COPD (chronic obstructive pulmonary disease) ([place]) 01/23/2013  . Anemia 08/03/2012  . Essential  hypertension, benign 08/03/2012  . Mixed hyperlipidemia 08/03/2012  . Hx of CABG 08/03/2012    Orientation RESPIRATION BLADDER Height & Weight     Self, Situation, Time, Place  Normal Incontinent, External catheter Weight: 195 lb 5.2 oz (88.6 kg) Height:  5\' 9"  (175.3 cm)  BEHAVIORAL SYMPTOMS/MOOD NEUROLOGICAL BOWEL NUTRITION STATUS      Continent Diet(see discharge summary)  AMBULATORY STATUS COMMUNICATION OF NEEDS Skin   Limited Assist Verbally Skin abrasions, Other (Comment)(incision on lip; wound on medial left leg with daily gauze dressing changes; abrasion on left hand)                       Personal Care Assistance Level of Assistance  Bathing, Feeding, Dressing Bathing Assistance: Limited assistance Feeding assistance: Independent Dressing Assistance: Limited assistance     Functional Limitations Info  Sight, Hearing, Speech Sight Info: Adequate(wears glasses) Hearing Info: Adequate Speech Info: Adequate    SPECIAL CARE FACTORS FREQUENCY  PT (By licensed PT), OT (By licensed OT)     PT Frequency: 5x week OT Frequency: 5x week            Contractures Contractures Info: Not present    Additional Factors Info  Allergies, Code Status Code Status Info: DNR Allergies Info: Morphine and Related           Current Medications (02/22/2018):  This is the current hospital active medication list Current Facility-Administered Medications  Medication Dose Route Frequency Provider Last Rate Last Dose  . acetaminophen (TYLENOL) tablet 650 mg  650 mg Oral Q6H PRN Karmen Bongo, MD   650 mg at 02/19/18 1850   Or  . acetaminophen (TYLENOL) suppository 650  mg  650 mg Rectal Q6H PRN Karmen Bongo, MD      . albuterol (PROVENTIL) (2.5 MG/3ML) 0.083% nebulizer solution 3 mL  3 mL Inhalation Q6H PRN Karmen Bongo, MD   3 mL at 02/22/18 0326  . enoxaparin (LOVENOX) injection 85 mg  1 mg/kg Subcutaneous Q12H Domenic Polite, MD   85 mg at 02/22/18 0934  . feeding  supplement (JEVITY 1.2 CAL) liquid 1,000 mL  1,000 mL Per Tube Continuous Domenic Polite, MD 65 mL/hr at 02/22/18 0326 1,000 mL at 02/22/18 0326  . feeding supplement (PRO-STAT SUGAR FREE 64) liquid 30 mL  30 mL Per Tube BID Domenic Polite, MD   30 mL at 02/22/18 0934  . free water 200 mL  200 mL Per Tube QID Domenic Polite, MD   200 mL at 02/22/18 0934  . hydrALAZINE (APRESOLINE) injection 5 mg  5 mg Intravenous Q4H PRN Karmen Bongo, MD   5 mg at 02/18/18 1712  . HYDROmorphone (DILAUDID) injection 0.5 mg  0.5 mg Intravenous Q4H PRN Domenic Polite, MD   0.5 mg at 02/22/18 0414  . MEDLINE mouth rinse  15 mL Mouth Rinse BID Domenic Polite, MD   15 mL at 02/22/18 0934  . metoprolol tartrate (LOPRESSOR) injection 5 mg  5 mg Intravenous Q6H Domenic Polite, MD   5 mg at 02/22/18 1115  . morphine 4 MG/ML injection 1 mg  1 mg Intravenous Once Kirby-G[place], Karsten Fells, NP      . mupirocin ointment (BACTROBAN) 2 %   Topical Daily Domenic Polite, MD      . ondansetron [facility]) tablet 4 mg  4 mg Oral Q6H PRN Karmen Bongo, MD       Or  . ondansetron [facility]) injection 4 mg  4 mg Intravenous Q6H PRN Karmen Bongo, MD      . piperacillin-tazobactam (ZOSYN) IVPB 3.375 g  3.375 g Intravenous Q8H Karren Cobble, [place]   Stopped at 02/22/18 2585  . white petrolatum (VASELINE) gel   Topical PRN Domenic Polite, MD         Discharge Medications: Please see discharge summary for a list of discharge medications.  Relevant Imaging Results:  Relevant Lab Results:   Additional Information SS# [place], [place]

## 2018-02-23 ENCOUNTER — Inpatient Hospital Stay (HOSPITAL_COMMUNITY): Payer: Medicare Other

## 2018-02-23 LAB — CBC
HCT: 38.9 % — ABNORMAL LOW (ref 39.0–52.0)
Hemoglobin: 12 g/dL — ABNORMAL LOW (ref 13.0–17.0)
MCH: 25.7 pg — AB (ref 26.0–34.0)
MCHC: 30.8 g/dL (ref 30.0–36.0)
MCV: 83.3 fL (ref 78.0–100.0)
PLATELETS: 254 10*3/uL (ref 150–400)
RBC: 4.67 MIL/uL (ref 4.22–5.81)
RDW: 20.5 % — ABNORMAL HIGH (ref 11.5–15.5)
WBC: 24.6 10*3/uL — ABNORMAL HIGH (ref 4.0–10.5)

## 2018-02-23 LAB — BASIC METABOLIC PANEL
Anion gap: 10 (ref 5–15)
BUN: 43 mg/dL — AB (ref 6–20)
CO2: 28 mmol/L (ref 22–32)
CREATININE: 0.98 mg/dL (ref 0.61–1.24)
Calcium: 8.7 mg/dL — ABNORMAL LOW (ref 8.9–10.3)
Chloride: 96 mmol/L — ABNORMAL LOW (ref 101–111)
GFR calc Af Amer: >60 mL/min (ref >60)
GLUCOSE: 130 mg/dL — AB (ref 65–99)
Potassium: 3.8 mmol/L (ref 3.5–5.1)
Sodium: 134 mmol/L — ABNORMAL LOW (ref 135–145)

## 2018-02-23 LAB — GLUCOSE, CAPILLARY
GLUCOSE-CAPILLARY: 121 mg/dL — AB (ref 65–99)
GLUCOSE-CAPILLARY: 126 mg/dL — AB (ref 65–99)
Glucose-Capillary: 121 mg/dL — ABNORMAL HIGH (ref 65–99)
Glucose-Capillary: 142 mg/dL — ABNORMAL HIGH (ref 65–99)
Glucose-Capillary: 119 mg/dL — ABNORMAL HIGH (ref 65–99)
Glucose-Capillary: 110 mg/dL — ABNORMAL HIGH (ref 65–99)

## 2018-02-23 LAB — PROTIME-INR
INR: 1
Prothrombin Time: 13.1 seconds (ref 11.4–15.2)

## 2018-02-23 MED ORDER — RESOURCE THICKENUP CLEAR PO POWD
ORAL | Status: DC | PRN
  Filled 2018-02-23: qty 125

## 2018-02-23 NOTE — Progress Notes (Signed)
  Speech Language Pathology Treatment: Dysphagia  Patient Details Name: John Sampson MRN: [id number] DOB: 1936/04/01 Today's Date: 02/23/2018 Time: 6962-9528 SLP Time Calculation (min) (ACUTE ONLY): 12 min  Assessment / Plan / Recommendation Clinical Impression  Pt tired, stating "I just got back from a test and am exhausted" however agreeable to 1-2 sips nectar V-8 juice with immediate throat clear and cough expectorating mucous and likely portion of juice. Reviewed results and recommendations of yesterday's MBS and pt independently turned head to left during swallow. Intake decreased per RN. Per ENT, CT of soft tissue neck revealed fairly extensive recurrence of his thyroid cancer adjacent to the trachea as well as into the anterior wall of his esophagus. In addition to probable lung mets. Plans are for PEG and to continue to allow pt to consume Dys 1 and nectar thick liquids. ST will continue for education, observation and modification if needed.      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 Also with total thyroidectomy and laryngoplasty. He presented to [facility] increasing SOB and hoarseness. He was taken to the OR on 3/15 by Dr. Lucia Gaskins for injection of left vocal cord to strengthen his voice. He presented today to Dr. Pollie Friar office and there was concern for dehydration and dysphagia so he was sent to [facility] for direct admission (actually observation). Since the procedure, he has been able to have any solid or liquid food - it just won't go down or it goes down and comes right back up. Also unable to swallow any pills. He had the procedure Friday and did fine. He was told to go home with a soft diet and he was able to eat and drink that night. He ate breakfast ok Saturday but he wasn't able to eat after that. Dr. Shirlee More that on in-office exam the vocal cords are clear without  swelling or infection but the patient was unable to swallow water in his office. He recommends IVF, labs, modified barium swallow evaluationfor esophageal obstruction/aspiration.      SLP Plan  Continue with current plan of care       Recommendations  Diet recommendations: Nectar-thick liquid Liquids provided via: Cup;No straw Medication Administration: Crushed with puree Supervision: Patient able to self feed;Intermittent supervision to cue for compensatory strategies Compensations: Multiple dry swallows after each bite/sip;Effortful swallow;Other (Comment);Clear throat intermittently(let head turn) Postural Changes and/or Swallow Maneuvers: Seated upright 90 degrees                Oral Care Recommendations: Oral care BID Follow up Recommendations: Other (comment)(TBD) SLP Visit Diagnosis: Dysphagia, pharyngoesophageal phase (R13.14) Plan: Continue with current plan of care                      Houston Siren 02/23/2018, 2:41 PM   Orbie Pyo Colvin Caroli.[employer] [phone]

## 2018-02-23 NOTE — Progress Notes (Addendum)
PROGRESS NOTE    John Sampson  DVV:[id number] DOB: 13-Nov-1936 DOA: 02/14/2018 PCP: Sinda Du, MD  Brief Narrative:John Sampson is a 82 y.o. 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  He presented to ENT with increasing SOB and hoarseness found to have VOcal cord paralysis.  He was taken to the OR on 3/15 by Dr. Lucia Gaskins and underwent prolaryn injection to vocal cords, then presented 3/19  to Dr. Pollie Friar office and there was concern for dehydration and dysphagia so he was sent to [facility] for direct admission.  Since the procedure, he has been able to have any solid or liquid food . Will Modified Barium swallow 3/20 with severe cervical esophageal swelling with resultant dysphagia with obstructing tissue at C5/6 and C7. Pt is unable to pass 1/4 teaspoon of thin liquids despite maximal effortful swallow  -ENT consulted, 3/20 started steroids and Abx, Cortrak placed for tube feeds -no significant improvement in dysphagia yet -CT neck 3/27 fairly extensive recurrence of his thyroid cancer adjacent to the trachea as well as into the anterior wall of his esophagus and probable lung mets  Assessment & Plan:   Severe Cervical esophageal swelling with dysphagia  --Status post prolaryn injection to vocal cords 3/15 for vocal cord paralysis, since then had profound dysphagia, MBS 3/20 which noted Severe cervical esophageal swelling with resultant dysphagia with obstructing tissue at C5/6 and C7. -ENT, Dr.Newman following, treated with 1 week of  ABx and IV decadron, no significant improvement in his dysphagia and continues to have significant cervical esophageal swelling -CT neck 3/27 completed showed extensive recurrence of his thyroid cancer adjacent to the trachea and involving esophagus and probable lung metastases, stopped Decadron -Called and updated Dr. Lucia Gaskins, he will follow-up -We  agreed to place PEG tube after discussion with patient and son, and Dr. Lucia Gaskins will discuss with radiation oncology regarding options for his aggressive papillary thyroid cancer which appears to be metastatic at this time -IR consulted for PEG tube, remove cortrak when PEG placed in -continue D1 diet with thickened liquids simultaneously  Aggressive papillary thyroid CA -history of left papillary thyroid carcinoma with paralysis of vocal cord -history of thyroidectomy and laryngoplasty about 2 years ago -appears to have Lung mets based this scan -ENT Dr.NEwman to d/w Radonc and Endocrine regarding options to Rx this   leukocytosis -most likely secondary to underlying aggressive malignancy, clinically no evidence of active infection at this point, -Stop IV Zosyn 3/30, will have completed a ten-day total course of antibiotics -steroids were suspected to be contributing as well, I stopped his Decadron  P.Afib on Xarelto -in NSR, and HR controlled -hold Xarelto, continue full dose lovenox for now -PO metoprolol and Diltiazem on hold -continue IV lopressor   H/o Hypothyroidism -currently hyperthyroid by labs, oversupressed -recheck levels and resume synthroid at very small dose in 4-6weeks  HTN -unable to tolerate PO, IV metoprolol and when necessary hydralazine for now  HLD -Hold Zocor   Acute on Chronic diastolic heart failure -diuresed with IV lasix 3/25, and 3/26 -monitor VOlume status closely -clinically appears euvolemic now, monitor closely  R leg abrasion/skin tear -no s/s of infection, [place] consult appreciated  DVT prophylaxis: Treatment-dose Lovenox Code Status:  DNR  Family Communication: no family at bedside, called and updated son Draper Gallon 3/23 and today 3/28 Disposition Plan:  SNF next week pending PEG tube and further plans for thyroid  cancer  Consults  - ENT  -SLP  Procedures: MBS x2  Antimicrobials:    Subjective: -just back from MBS, feeling a  little better Objective: Vitals:   02/22/18 2207 02/23/18 0611 02/23/18 0730 02/23/18 1316  BP: (!) 151/54 (!) 126/55 (!) 119/43 121/60  Pulse: 70 (!) 50 86 80  Resp:   17 (!) 21  Temp: 97.8 F (36.6 C) (!) 97.5 F (36.4 C) (!) 97.4 F (36.3 C) (!) 97.4 F (36.3 C)  TempSrc: Oral Oral Oral Oral  SpO2: 94% 96% 91% 93%  Weight:  86.3 kg (190 lb 3.2 oz)    Height:        Intake/Output Summary (Last 24 hours) at 02/23/2018 1351 Last data filed at 02/23/2018 1335 Gross per 24 hour  Intake 1537.17 ml  Output 2250 ml  Net -712.83 ml   Filed Weights   02/18/18 0443 02/22/18 0415 02/23/18 0611  Weight: 89.1 kg (196 lb 6.9 oz) 88.6 kg (195 lb 5.2 oz) 86.3 kg (190 lb 3.2 oz)    Examination: Gen: elderly, frail, chronically ill-appearing male,sitting in bed HEENT: no JVD Lungs: few scattered rhonchi at the bases CVS: RRR,No Gallops,Rubs or new Murmurs Abd: soft, Non tender, non distended, BS present Extremities: Trace edema, left leg abrasion with dressing Skin: no new rashes Psychiatry: Judgement and insight appear normal. Mood & affect appropriate.     Data Reviewed:   CBC: Recent Labs  Lab 02/19/18 0601 02/20/18 0512 02/21/18 1037 02/22/18 0425 02/23/18 0406  WBC 19.6* 21.8* 20.4* 22.8* 24.6*  HGB 12.5* 12.6* 12.0* 11.8* 12.0*  HCT 40.6 40.6 39.1 39.4 38.9*  MCV 84.4 84.6 85.6 84.7 83.3  PLT 318 325 274 263 240   Basic Metabolic Panel: Recent Labs  Lab 02/18/18 0621 02/19/18 0601 02/20/18 0512 02/22/18 0425 02/23/18 0406  NA 137 137 134* 134* 134*  K 4.3 4.5 5.0 5.1 3.8  CL 100* 101 99* 96* 96*  CO2 26 27 25 30 28   GLUCOSE 130* 136* 142* 149* 130*  BUN 28* 37* 49* 48* 43*  CREATININE 0.78 0.84 0.92 1.03 0.98  CALCIUM 9.1 9.2 8.7* 8.7* 8.7*   GFR: Estimated Creatinine Clearance: 65.4 mL/min (by C-G formula based on SCr of 0.98 mg/dL). Liver Function Tests: No results for input(s): AST, ALT, ALKPHOS, BILITOT, PROT, ALBUMIN in the last 168 hours. No  results for input(s): LIPASE, AMYLASE in the last 168 hours. No results for input(s): AMMONIA in the last 168 hours. Coagulation Profile: No results for input(s): INR, PROTIME in the last 168 hours. Cardiac Enzymes: No results for input(s): CKTOTAL, CKMB, CKMBINDEX, TROPONINI in the last 168 hours. BNP (last 3 results) No results for input(s): PROBNP in the last 8760 hours. HbA1C: No results for input(s): HGBA1C in the last 72 hours. CBG: Recent Labs  Lab 02/22/18 1949 02/22/18 2349 02/23/18 0415 02/23/18 0737 02/23/18 1218  GLUCAP 89 121* 121* 142* 119*   Lipid Profile: No results for input(s): CHOL, HDL, LDLCALC, TRIG, CHOLHDL, LDLDIRECT in the last 72 hours. Thyroid Function Tests: No results for input(s): TSH, T4TOTAL, FREET4, T3FREE, THYROIDAB in the last 72 hours. Anemia Panel: No results for input(s): VITAMINB12, FOLATE, FERRITIN, TIBC, IRON, RETICCTPCT in the last 72 hours. Urine analysis:    Component Value Date/Time   COLORURINE YELLOW 12/06/2017 1215   APPEARANCEUR HAZY (A) 12/06/2017 1215   LABSPEC 1.021 12/06/2017 1215   PHURINE 5.0 12/06/2017 1215   GLUCOSEU NEGATIVE 12/06/2017 1215   HGBUR LARGE (A) 12/06/2017 1215  BILIRUBINUR NEGATIVE 12/06/2017 [place] 12/06/2017 1215   PROTEINUR 30 (A) 12/06/2017 1215   NITRITE NEGATIVE 12/06/2017 1215   LEUKOCYTESUR TRACE (A) 12/06/2017 1215   Sepsis Labs: @LABRCNTIP (procalcitonin:4,lacticidven:4)  )No results found for this or any previous visit (from the past 240 hour(s)).       Radiology Studies: Ct Soft Tissue Neck W Contrast  Result Date: 02/22/2018 CLINICAL DATA:  Improving dysphagia. Status post laryngoscopy. History of thyroid cancer, thyroidectomy, laryngoplasty, pituitary macroadenoma. EXAM: CT NECK WITH CONTRAST TECHNIQUE: Multidetector CT imaging of the neck was performed using the standard protocol following the bolus administration of intravenous contrast. CONTRAST:  74mL  ISOVUE-300 IOPAMIDOL (ISOVUE-300) INJECTION 61% COMPARISON:  CT neck August 25, 2016 FINDINGS: PHARYNX AND LARYNX: Focally sclerotic LEFT thyroid and cricoid cartilages consistent with tumoral involvement. Mild medial deviation RIGHT true vocal cord could reflect paralysis. Normal pharynx. SALIVARY GLANDS: Normal. THYROID: Status post thyroidectomy. 21 x 14 mm abnormal soft tissue LEFT posterior thyroid bed and appears to invade the anterior status soft [place]. Additional subcentimeter nodules inferior LEFT thyroid bed. LYMPH NODES: 10 mm round LEFT level 4 lymph node. 9 mm LEFT level 7 lymph node with additional smaller bilateral 7 lymph nodes. VASCULAR: Moderate calcific atherosclerosis aortic arch. Moderate calcific atherosclerosis carotid bifurcations. LIMITED INTRACRANIAL: Normal. VISUALIZED ORBITS: Status post bilateral ocular lens implants. MASTOIDS AND VISUALIZED PARANASAL SINUSES: LEFT nasogastric tube. SKELETON: Nonacute.  Patient is edentulous. UPPER CHEST: Centrilobular emphysema. Multiple LEFT pulmonary nodules measuring to at least 11 mm, some of which are new from prior CT. Moderate centrilobular emphysema. Status post median sternotomy. OTHER: None. IMPRESSION: 1. Status post thyroidectomy with 21 x 14 mm mass LEFT thyroid bed consistent with invasive residual versus recurrent tumor (including suspected esophageal invasion). 2. New pathologic level for lymphadenopathy with probable additional smaller alignment lymph nodes LEFT level 7. 3. Multiple pulmonary nodules, some which are new from prior CT most compatible with progressive metastasis. Aortic Atherosclerosis (ICD10-I70.0) and Emphysema (ICD10-J43.9). Electronically Signed   By: Elon Alas M.D.   On: 02/22/2018 21:14   Dg Chest Port 1 View  Result Date: 02/22/2018 CLINICAL DATA:  Dyspnea. History of COPD, previous MI, atrial flutter, chronic CHF. Former smoker. EXAM: PORTABLE CHEST 1 VIEW COMPARISON:  PA and lateral chest x-ray  of February 18, 2018. FINDINGS: The lungs are adequately inflated and clear. The heart and pulmonary vascularity are normal. The mediastinum is normal in width. There are post CABG changes. There is calcification in the wall of the aortic arch. IMPRESSION: Previous CABG. No pneumonia, CHF, nor other acute cardiopulmonary abnormality. Electronically Signed   By: David  Martinique M.D.   On: 02/22/2018 14:22        Scheduled Meds: . enoxaparin (LOVENOX) injection  1 mg/kg Subcutaneous Q12H  . feeding supplement (PRO-STAT SUGAR FREE 64)  30 mL Per Tube BID  . free water  200 mL Per Tube QID  . mouth rinse  15 mL Mouth Rinse BID  . metoprolol tartrate  5 mg Intravenous Q6H  .  morphine injection  1 mg Intravenous Once  . mupirocin ointment   Topical Daily   Continuous Infusions: . feeding supplement (JEVITY 1.2 CAL) 1,000 mL (02/23/18 1039)  . piperacillin-tazobactam (ZOSYN)  IV 3.375 g (02/23/18 1039)     LOS: 8 days    Time spent:73min    Domenic Polite, MD [facility] Page via [email or web address], password [email or web address] After 7PM please contact night-coverage  02/23/2018, 1:51 PM

## 2018-02-23 NOTE — Progress Notes (Signed)
IR aware of request for gastrostomy tube placement.  No recent abdominal imaging.  Have ordered CT Abdomen for review with IR MD.   Brynda Greathouse, MS RD PA-C 10:42 AM

## 2018-02-23 NOTE — Progress Notes (Signed)
Pharmacy Antibiotic Note  John Sampson is a 83 y.o. male admitted on 02/14/2018 with esophagitis and possible aspiration.  Pharmacy has been consulted for Zosyn dosing.  Abx D#8 for esophagitis, aspiration - Afebrile, WBC 24.6 going up (had been on dexamethasone). CXR negative and was on steroids.  Plan: Continue Zosyn 3.375 gm IV q8h (4 hour infusion) Monitor clinical picture, renal function F/U LOT Consider monitoring off abx?  Height: 5\' 9"  (175.3 cm) Weight: 190 lb 3.2 oz (86.3 kg) IBW/kg (Calculated) : 70.7  Temp (24hrs), Avg:97.6 F (36.4 C), Min:97.4 F (36.3 C), Max:97.8 F (36.6 C)  Recent Labs  Lab 02/18/18 0621 02/19/18 0601 02/20/18 0512 02/21/18 1037 02/22/18 0425 02/23/18 0406  WBC 20.6* 19.6* 21.8* 20.4* 22.8* 24.6*  CREATININE 0.78 0.84 0.92  --  1.03 0.98    Estimated Creatinine Clearance: 65.4 mL/min (by C-G formula based on SCr of 0.98 mg/dL).    Allergies  Allergen Reactions  . Morphine And Related Nausea And Vomiting    Antimicrobials this admission: Unasyn 3/20 >>3/25 Zosyn 3/25>>  Dose adjustments this admission: n/a  Microbiology results: n/a  Thank you for allowing pharmacy to be a part of this patient's care.  Reginia Naas 02/23/2018 9:29 AM

## 2018-02-23 NOTE — Care Management Note (Signed)
Case Management Note  Patient Details  Name: John Sampson MRN: [id number] Date of Birth: [birth year]  Subjective/Objective:  Pt admitted on 3/19 with increasing SOB and hoarseness.  He was taken to the OR on 3/15 by Dr. Lucia Gaskins for injection of left vocal cord to strengthen his voice.   He presented on 02/14/18 to Dr. Pollie Friar office and there was concern for dehydration and dysphagia so he was sent to [facility]. Went home after this but was still unable to swallow so returned and now has NG tube.  PTA, pt independent, resided at home alone.               Action/Plan: PT recommending SNF; OT consult pending.  CSW following to facilitate dc to SNF upon medical stability.  Will follow progress.   Expected Discharge Date:                  Expected Discharge Plan:  Skilled Nursing Facility  In-House Referral:  Clinical Social Work  Discharge planning Services  CM Consult  Post Acute Care Choice:    Choice offered to:     DME Arranged:    DME Agency:     HH Arranged:    [place] Agency:     Status of Service:  In process, will continue to follow  If discussed at Long Length of Stay Meetings, dates discussed:    Additional Comments:  Reinaldo Raddle, RN, BSN  Trauma/Neuro ICU Case Manager [phone]

## 2018-02-23 NOTE — Consult Note (Signed)
Patient had CT scan to further evaluate dysphagia. This showed fairly extensive recurrence of his thyroid cancer adjacent to the trachea as well as into the anterior wall of his esophagus. In addition to probable lung mets. Gastrostomy tube placement was recommended and patient is agreeable to this after discussion with him about his cancer. I will call Dr Criss Rosales to see if there is any role for further radioactive I treatment that would help.[name]

## 2018-02-23 NOTE — Progress Notes (Signed)
Physical Therapy Treatment Patient Details Name: John Sampson MRN: [id number] DOB: [birth year] Today's Date: 02/23/2018    History of Present Illness John Sampson is a 82 y.o. 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  Also with total thyroidectomy and laryngoplasty.  He presented to ENT with increasing SOB and hoarseness.  He was taken to the OR on 3/15 by Dr. Lucia Gaskins for injection of left vocal cord to strengthen his voice.   He presented today to Dr. Pollie Friar office and there was concern for dehydration and dysphagia so he was sent to [facility]. Went home after this but was still unable to swallow so returned and now has NG tube. -CT neck 3/27 fairly extensive recurrence of his thyroid cancer adjacent to the trachea as well as into the anterior wall of his esophagus and probable lung mets     PT Comments    Pt agreeable to work with therapy this visit. Patient able to ambulate slightly further distances but still requires quick rest breaks. SpO2=91% during session with activity on RA. Utilizing RW for stability and chair follow for ambulation. SNF recs still appropriate from mobility standpoint.   Follow Up Recommendations  SNF     Equipment Recommendations  Rolling walker with 5" wheels    Recommendations for Other Services       Precautions / Restrictions Precautions Precautions: Fall Restrictions Weight Bearing Restrictions: No Other Position/Activity Restrictions: NG tube    Mobility  Bed Mobility               General bed mobility comments: OOB in chair  Transfers Overall transfer level: Needs assistance Equipment used: Rolling walker (2 wheeled) Transfers: Sit to/from Stand Sit to Stand: Min assist         General transfer comment: min A to steady  Ambulation/Gait Ambulation/Gait assistance: Min guard Ambulation Distance (Feet): 40 Feet Assistive device: Rolling walker (2 wheeled) Gait  Pattern/deviations: Step-through pattern;Shuffle;Trunk flexed Gait velocity: decreased   General Gait Details: pt fatigued very quickly, took 2 seated rest breaks in 40'. O2 sats maintained 91% on RA reports dizziness with standing and requests to return to room    Stairs            Wheelchair Mobility    Modified Rankin (Stroke Patients Only)       Balance Overall balance assessment: Needs assistance Sitting-balance support: Single extremity supported Sitting balance-Leahy Scale: Fair     Standing balance support: Bilateral upper extremity supported Standing balance-Leahy Scale: Poor Standing balance comment: reliant on UE support                            Cognition Arousal/Alertness: Awake/alert Behavior During Therapy: WFL for tasks assessed/performed Overall Cognitive Status: Within Functional Limits for tasks assessed                                        Exercises      General Comments        Pertinent Vitals/Pain Pain Assessment: No/denies pain Faces Pain Scale: Hurts even more    Home Living                      Prior Function            PT Goals (current goals  can now be found in the care plan section) Acute Rehab PT Goals Patient Stated Goal: get rid of NG tube PT Goal Formulation: With patient Time For Goal Achievement: 03/07/18 Potential to Achieve Goals: Good Progress towards PT goals: Progressing toward goals    Frequency    Min 3X/week      PT Plan Current plan remains appropriate    Co-evaluation              AM-PAC PT "6 Clicks" Daily Activity  Outcome Measure  Difficulty turning over in bed (including adjusting bedclothes, sheets and blankets)?: A Little Difficulty moving from lying on back to sitting on the side of the bed? : A Little Difficulty sitting down on and standing up from a chair with arms (e.g., wheelchair, bedside commode, etc,.)?: A Little Help needed moving to  and from a bed to chair (including a wheelchair)?: A Little Help needed walking in hospital room?: A Little Help needed climbing 3-5 steps with a railing? : A Lot 6 Click Score: 17    End of Session Equipment Utilized During Treatment: Gait belt Activity Tolerance: Patient tolerated treatment well;Patient limited by fatigue Patient left: in chair;with call bell/phone within reach Nurse Communication: Mobility status PT Visit Diagnosis: Unsteadiness on feet (R26.81);Muscle weakness (generalized) (M62.81);Pain;Difficulty in walking, not elsewhere classified (R26.2) Pain - Right/Left: Left Pain - part of body: Leg     Time: 7972-8206 PT Time Calculation (min) (ACUTE ONLY): 17 min  Charges:  $Gait Training: 8-22 mins                    G Codes:       Reinaldo Berber, PT, DPT Acute Rehab Services Pager: [phone]     Reinaldo Berber 02/23/2018, 5:50 PM

## 2018-02-23 NOTE — Progress Notes (Signed)
ANTICOAGULATION CONSULT NOTE - Initial Consult  Pharmacy Consult for Lovenox Indication: atrial fibrillation  Allergies  Allergen Reactions  . Morphine And Related Nausea And Vomiting    Patient Measurements: Height: 5\' 9"  (175.3 cm) Weight: 190 lb 3.2 oz (86.3 kg) IBW/kg (Calculated) : 70.7  Vital Signs: Temp: 97.4 F (36.3 C) (03/28 0730) Temp Source: Oral (03/28 0730) BP: 119/43 (03/28 0730) Pulse Rate: 86 (03/28 0730)  Labs: Recent Labs    02/21/18 1037 02/22/18 0425 02/23/18 0406  HGB 12.0* 11.8* 12.0*  HCT 39.1 39.4 38.9*  PLT 274 263 254  CREATININE  --  1.03 0.98    Estimated Creatinine Clearance: 65.4 mL/min (by C-G formula based on SCr of 0.98 mg/dL).   Medical History: Past Medical History:  Diagnosis Date  . Atrial flutter ([place])    on Eliquis  . AV malformation of GI tract   . COPD (chronic obstructive pulmonary disease) ([place]) 3.12.14   2D Echo, EF 50-55%  . Coronary atherosclerosis of native coronary artery    Multivessel status post CABG  . Gastric ulcer    Small - nonbleeding  . GERD (gastroesophageal reflux disease)   . Headache(784.0)   . Hypothyroidism   . Iron deficiency anemia    Negative Givens capsule study   . Myocardial infarction ([place])   . PAD (peripheral artery disease) ([facility])    Moderate bilateral SFA disease at angiography 01/2013  . Pituitary macroadenoma ([place])   . Thyroid cancer ([place])   . Vocal cord paralysis    left   Assessment: 82 yo M on Eliquis PTA for AFib. Has had dysphagia so transitioned to Lovenox. Hgb stable, plts wnl.  Goal of Therapy:  Monitor platelets by anticoagulation protocol: Yes   Plan:  Continue Lovenox 85mg  SQ Q12H Monitor CBC, s/s of bleed F/U transition back to [employer] 02/23/2018,9:26 AM

## 2018-02-24 ENCOUNTER — Inpatient Hospital Stay (HOSPITAL_COMMUNITY): Payer: Medicare Other

## 2018-02-24 ENCOUNTER — Encounter (HOSPITAL_COMMUNITY): Payer: Self-pay | Admitting: Interventional Radiology

## 2018-02-24 DIAGNOSIS — D72829 Elevated white blood cell count, unspecified: Secondary | ICD-10-CM

## 2018-02-24 HISTORY — PX: IR GASTROSTOMY TUBE MOD SED: IMG625

## 2018-02-24 LAB — GLUCOSE, CAPILLARY
GLUCOSE-CAPILLARY: 97 mg/dL (ref 65–99)
GLUCOSE-CAPILLARY: 93 mg/dL (ref 65–99)
GLUCOSE-CAPILLARY: 83 mg/dL (ref 65–99)
GLUCOSE-CAPILLARY: 110 mg/dL — AB (ref 65–99)
Glucose-Capillary: 111 mg/dL — ABNORMAL HIGH (ref 65–99)
Glucose-Capillary: 89 mg/dL (ref 65–99)

## 2018-02-24 MED ORDER — MIDAZOLAM HCL 2 MG/2ML IJ SOLN
INTRAMUSCULAR | Status: AC | PRN
  Administered 2018-02-24: 0.5 mg via INTRAVENOUS
  Administered 2018-02-24: 1 mg via INTRAVENOUS

## 2018-02-24 MED ORDER — FENTANYL CITRATE (PF) 100 MCG/2ML IJ SOLN
INTRAMUSCULAR | Status: AC | PRN
  Administered 2018-02-24: 50 ug via INTRAVENOUS
  Administered 2018-02-24: 25 ug via INTRAVENOUS

## 2018-02-24 MED ORDER — LIDOCAINE HCL 1 % IJ SOLN
INTRAMUSCULAR | Status: AC
  Filled 2018-02-24: qty 20

## 2018-02-24 MED ORDER — GLUCAGON HCL RDNA (DIAGNOSTIC) 1 MG IJ SOLR
INTRAMUSCULAR | Status: AC | PRN
  Administered 2018-02-24: .5 mg via INTRAVENOUS

## 2018-02-24 MED ORDER — DEXTROSE IN LACTATED RINGERS 5 % IV SOLN
INTRAVENOUS | Status: DC
  Administered 2018-02-24: 17:00:00 via INTRAVENOUS

## 2018-02-24 MED ORDER — MIDAZOLAM HCL 2 MG/2ML IJ SOLN
INTRAMUSCULAR | Status: AC
  Filled 2018-02-24: qty 4

## 2018-02-24 MED ORDER — GLUCAGON HCL RDNA (DIAGNOSTIC) 1 MG IJ SOLR
INTRAMUSCULAR | Status: AC
  Filled 2018-02-24: qty 1

## 2018-02-24 MED ORDER — LIDOCAINE HCL (PF) 1 % IJ SOLN
INTRAMUSCULAR | Status: AC | PRN
  Administered 2018-02-24: 10 mL

## 2018-02-24 MED ORDER — FENTANYL CITRATE (PF) 100 MCG/2ML IJ SOLN
INTRAMUSCULAR | Status: AC
  Filled 2018-02-24: qty 4

## 2018-02-24 MED ORDER — IOPAMIDOL (ISOVUE-300) INJECTION 61%
INTRAVENOUS | Status: AC
  Administered 2018-02-24: 10 mL
  Filled 2018-02-24: qty 50

## 2018-02-24 NOTE — Consult Note (Signed)
Chief Complaint: Patient was seen in consultation today for percutaneous gastric tube placement at the request of Dr Fanny Bien  Supervising Physician: Daryll Brod  Patient Status: [facility] - In-pt  History of Present Illness: John Sampson is a 82 y.o. male   Thyroid cancer Progressive dysphagia Malnutrition Request for percutaneous gastric tube placement  Modified Barium swallow 3/20 with severe cervical esophageal swelling with resultant dysphagia with obstructing tissue at C5/6 and C7. Pt is unable to pass1/4 teaspoon of thin liquids despite maximal effortful swallow  Imaging reviewed with Dr Annamaria Boots-- approves procedure  Past Medical History:  Diagnosis Date  . Atrial flutter ([place])    on Eliquis  . AV malformation of GI tract   . COPD (chronic obstructive pulmonary disease) ([place]) 3.12.14   2D Echo, EF 50-55%  . Coronary atherosclerosis of native coronary artery    Multivessel status post CABG  . Gastric ulcer    Small - nonbleeding  . GERD (gastroesophageal reflux disease)   . Headache(784.0)   . Hypothyroidism   . Iron deficiency anemia    Negative Givens capsule study   . Myocardial infarction ([place])   . PAD (peripheral artery disease) (HCC)    Moderate bilateral SFA disease at angiography 01/2013  . Pituitary macroadenoma ([place])   . Thyroid cancer ([place])   . Vocal cord paralysis    left    Past Surgical History:  Procedure Laterality Date  . ABDOMINAL AORTAGRAM N/A 02/19/2013   Procedure: ABDOMINAL Maxcine Ham;  Surgeon: [name], MD;  Location: [facility] CATH LAB;  Service: Cardiovascular;  Laterality: N/A;  . CARDIAC CATHETERIZATION    . CARDIOVERSION N/A 06/16/2015   Procedure: CARDIOVERSION;  Surgeon: Satira Sark, MD;  Location: [facility] OR[facility];  Service: Cardiovascular;  Laterality: N/A;  . COLONOSCOPY  08/18/2012   Procedure: COLONOSCOPY;  Surgeon: [name], MD;  [facility] END[facility];  Service: Endoscopy;  Laterality: N/A;  1:25  . COLONOSCOPY N/A  09/12/2017   Procedure: COLONOSCOPY;  Surgeon: [name], MD;  [facility];  Service: Endoscopy;  Laterality: N/A;  . CORONARY ARTERY BYPASS GRAFT  2003   5 grafts - details not clear  . CRANIOTOMY  12/31/2011   Procedure: CRANIOTOMY HYPOPHYSECTOMY TRANSNASAL APPROACH;  Surgeon: [name], MD;  Location: [place] NEURO ORS;  Service: Neurosurgery;  Laterality: N/A;  Transphenoidal Hypophysectomy With Fat Graft Harvest from right abdomen   . ESOPHAGOGASTRODUODENOSCOPY  08/18/2012   Procedure: ESOPHAGOGASTRODUODENOSCOPY (EGD);  Surgeon: [name], MD;  [facility];  Service: Endoscopy;  Laterality: N/A;  . ESOPHAGOGASTRODUODENOSCOPY N/A 03/31/2017   Procedure: ESOPHAGOGASTRODUODENOSCOPY (EGD);  Surgeon: [name], MD;  [facility];  Service: Endoscopy;  Laterality: N/A;  . ESOPHAGOGASTRODUODENOSCOPY N/A 09/12/2017   Procedure: ESOPHAGOGASTRODUODENOSCOPY (EGD);  Surgeon: [name], MD;  [facility];  Service: Endoscopy;  Laterality: N/A;  10:40  . EYE SURGERY  2012  . GIVENS CAPSULE STUDY N/A 01/25/2013   Procedure: GIVENS CAPSULE STUDY;  Surgeon: [name], MD;  [facility];  Service: Endoscopy;  Laterality: N/A;  730  . HOT HEMOSTASIS  03/31/2017   Procedure: HOT HEMOSTASIS (ARGON PLASMA COAGULATION/BICAP);  Surgeon: [name], MD;  [facility];  Service: Endoscopy;;  duodenum  . LARYNGOPLASTY Left 09/14/2016   Procedure: LARYNGOPLASTY;  Surgeon: [name], MD;  Location: [place];  Service: ENT;  Laterality: Left;  . LOWER EXTREMITY ANGIOGRAM N/A 02/19/2013  Procedure: LOWER EXTREMITY ANGIOGRAM;  Surgeon: [name], MD;  Location: [facility] CATH LAB;  Service: Cardiovascular;  Laterality: N/A;  . MICROLARYNGOSCOPY N/A 02/10/2018   Procedure: MICROLARYNGOSCOPY WITH VOCAL CORD INJECTION;  Surgeon: [name], MD;  Location: [place];  Service: ENT;  Laterality:  N/A;  . PERIPHERAL VASCULAR CATHETERIZATION N/A 01/19/2016   Procedure: Abdominal Aortogram;  Surgeon: Conrad Sauk, MD;  Location: [place] CV LAB;  Service: Cardiovascular;  Laterality: N/A;  . PERIPHERAL VASCULAR CATHETERIZATION Bilateral 01/19/2016   Procedure: Lower Extremity Angiography;  Surgeon: Conrad Wauhillau, MD;  Location: [place] CV LAB;  Service: Cardiovascular;  Laterality: Bilateral;  . PV Angiogram  02/19/13   Indications: slow healing left calf ulcer  . Stress Myocardial Perfusion  12/08/2011   Indications: Abnormal EKG, Eval of prior GABG  . TEE WITHOUT CARDIOVERSION N/A 06/16/2015   Procedure: TRANSESOPHAGEAL ECHOCARDIOGRAM (TEE);  Surgeon: Satira Sark, MD;  Location: [facility];  Service: Cardiovascular;  Laterality: N/A;  . THYROIDECTOMY N/A 09/14/2016   Procedure: TOTAL THYROIDECTOMY;  Surgeon: [name], MD;  Location: [place];  Service: ENT;  Laterality: N/A;  . TONSILLECTOMY  Age 13    Allergies: Morphine and related  Medications: Prior to Admission medications   Medication Sig Start Date End Date Taking? Authorizing Provider  albuterol (PROVENTIL HFA) 108 (90 BASE) MCG/ACT inhaler Inhale 2 puffs into the lungs every 6 (six) hours as needed for wheezing or shortness of breath. 11/15/14  Yes Satira Sark, MD  apixaban (ELIQUIS) 5 MG TABS tablet Take 5 mg by mouth 2 (two) times daily.  09/15/17  Yes Rehman, Mechele Dawley, MD  diltiazem (CARDIZEM CD) 240 MG 24 hr capsule Take 1 capsule (240 mg total) by mouth daily. 05/30/15  Yes Lendon Colonel, NP  folic acid (FOLVITE) 1 MG tablet Take 1 mg by mouth daily.    Yes [provider]  furosemide (LASIX) 20 MG tablet Take 1 tablet (20 mg total) by mouth daily. 01/11/18 04/11/18 Yes Imogene Burn, PA-C  levothyroxine (SYNTHROID, LEVOTHROID) 125 MCG tablet Take 125 mcg by mouth daily before breakfast.    Yes [provider]  lisinopril (PRINIVIL,ZESTRIL) 40 MG tablet Take 1 tablet (40 mg  total) by mouth daily. 01/25/18 04/25/18 Yes Imogene Burn, PA-C  metoprolol succinate (TOPROL-XL) 50 MG 24 hr tablet Take 50 mg by mouth 2 (two) times daily. Take with or immediately following a meal.   Yes [provider]  mirtazapine (REMERON) 15 MG tablet Take 15 mg by mouth at bedtime.   Yes [provider]  nitroGLYCERIN (NITROSTAT) 0.4 MG SL tablet Place 1 tablet (0.4 mg total) under the tongue every 5 (five) minutes as needed for chest pain. 06/07/14  Yes Satira Sark, MD  omeprazole (PRILOSEC) 20 MG capsule Take 20 mg by mouth 2 (two) times daily before a meal.   Yes [provider]  potassium chloride SA (K-DUR,KLOR-CON) 20 MEQ tablet Take 1 tablet (20 mEq total) by mouth daily. 01/11/18  Yes Imogene Burn, PA-C  simvastatin (ZOCOR) 40 MG tablet Take 0.5 tablets (20 mg total) by mouth at bedtime. 07/25/16  Yes Sinda Du, MD     Family History  Problem Relation Age of Onset  . CAD Mother   . Heart disease Father        before age 54    Social History   Socioeconomic History  . Marital status: Widowed  Spouse name: Not on file  . Number of children: Not on file  . Years of education: Not on file  . Highest education level: Not on file  Occupational History  . Occupation: retired  Scientific laboratory technician  . Financial resource strain: Not on file  . Food insecurity:    Worry: Not on file    Inability: Not on file  . Transportation needs:    Medical: Not on file    Non-medical: Not on file  Tobacco Use  . Smoking status: Former Smoker    Packs/day: 3.00    Years: 45.00    Pack years: 135.00    Types: Cigarettes    Start date: 01/24/1955    Last attempt to quit: 01/23/2002    Years since quitting: 16.0  . Smokeless tobacco: Never Used  Substance and Sexual Activity  . Alcohol use: No    Alcohol/week: 0.0 oz    Comment: "I quit drinking and chasing women" 30 years ago  . Drug use: No  . Sexual activity: Never    Partners: Female    Lifestyle  . Physical activity:    Days per week: Not on file    Minutes per session: Not on file  . Stress: Not on file  Relationships  . Social connections:    Talks on phone: Not on file    Gets together: Not on file    Attends religious service: Not on file    Active member of club or organization: Not on file    Attends meetings of clubs or organizations: Not on file    Relationship status: Not on file  Other Topics Concern  . Not on file  Social History Narrative  . Not on file    Review of Systems: A 12 point ROS discussed and pertinent positives are indicated in the HPI above.  All other systems are negative.  Review of Systems  Constitutional: Positive for activity change, appetite change and fatigue. Negative for fever.  HENT: Positive for trouble swallowing and voice change.   Respiratory: Negative for cough and shortness of breath.   Cardiovascular: Negative for chest pain.  Gastrointestinal: Negative for abdominal pain.  Neurological: Positive for weakness.  Psychiatric/Behavioral: Negative for behavioral problems and confusion.    Vital Signs: BP (!) 145/55 (BP Location: Right Arm)   Pulse 60   Temp 97.9 F (36.6 C) (Oral)   Resp 18   Ht 5\' 9"  (1.753 m)   Wt 191 lb 12.8 oz (87 kg)   SpO2 97%   BMI 28.32 kg/m   Physical Exam  Constitutional: He is oriented to person, place, and time.  Cardiovascular: Normal rate and regular rhythm.  Pulmonary/Chest: Effort normal.  Abdominal: Soft. Bowel sounds are normal.  Musculoskeletal: Normal range of motion.  Neurological: He is alert and oriented to person, place, and time.  Skin: Skin is warm and dry.  Psychiatric: He has a normal mood and affect. His behavior is normal. Judgment and thought content normal.  Nursing note and vitals reviewed.   Imaging: Ct Abdomen Wo Contrast  Result Date: 02/23/2018 CLINICAL DATA:  Dysphagia, aspiration risk, assess anatomy for gastrostomy insertion EXAM: CT ABDOMEN  WITHOUT CONTRAST TECHNIQUE: Multidetector CT imaging of the abdomen was performed following the standard protocol without IV contrast. COMPARISON:  02/22/2018 modified swallow FINDINGS: Lower chest: Several bilateral lower chest pulmonary nodules all measuring 5 mm or less in size within the lower lobes, inferior right middle lobe, and lingula Right basilar atelectasis versus  scarring also noted. Normal heart size. Native coronary atherosclerosis. Previous median sternotomy evident. Feeding tube extends into the stomach distally. Hepatobiliary: Cholelithiasis evident. No focal hepatic abnormality. No biliary dilatation or obstruction. Pancreas: No focal abnormality or ductal dilatation. No surrounding inflammatory process. Spleen: Normal in size. Adrenals/Urinary Tract: Normal adrenal glands. Kidneys demonstrate no acute obstructive uropathy or hydronephrosis. Ureters are symmetric and decompressed. Mild chronic perinephric strandy edema. Indeterminate posterior left renal hyperdense cortical lesion measures 10 mm this may represent a proteinaceous or hemorrhagic cyst. Stomach/Bowel: Feeding tube enters the stomach. No significant hiatal hernia. Stomach anatomy appears favorable for fluoroscopic gastrostomy technique. Stomach is inferior to the left hepatic lobe and the midportion of the stomach is superior to the transverse colon. Feeding tube extends into the duodenal bulb. Stomach is currently decompressed. Vascular/Lymphatic: Aortic atherosclerosis noted. Negative for aneurysm. No retroperitoneal hemorrhage or hematoma. No abdominal adenopathy. Other: No abdominal wall hernia. No free fluid or ascites. Negative for abscess. Musculoskeletal: No acute osseous finding. Anterior L2 vertebral body lytic lesion without compression pathologic fracture. Appearance is suspicious for metastatic disease or myeloma. IMPRESSION: Stomach anatomy appears favorable for attempt at fluoroscopic gastrostomy insertion. Numerous  subcentimeter bilateral pulmonary nodules suspicious for pulmonary metastases. Lytic L2 vertebral body suspicious for osseous metastasis. Atherosclerosis without aneurysm Cholelithiasis Electronically Signed   By: Jerilynn Mages.  Shick M.D.   On: 02/23/2018 16:06   Dg Chest 2 View  Result Date: 02/18/2018 CLINICAL DATA:  Leukocytosis. EXAM: CHEST - 2 VIEW COMPARISON:  Chest x-ray dated December 06, 2017. FINDINGS: Feeding tube noted with the tip likely in the distal stomach. Stable cardiomegaly status post CABG. Normal pulmonary vascularity. Emphysema. No focal consolidation, pleural effusion, or pneumothorax. Unchanged elevation of the right hemidiaphragm. No acute osseous abnormality. IMPRESSION: 1. COPD.  No active cardiopulmonary disease. Electronically Signed   By: Titus Dubin M.D.   On: 02/18/2018 11:37   Ct Soft Tissue Neck W Contrast  Result Date: 02/22/2018 CLINICAL DATA:  Improving dysphagia. Status post laryngoscopy. History of thyroid cancer, thyroidectomy, laryngoplasty, pituitary macroadenoma. EXAM: CT NECK WITH CONTRAST TECHNIQUE: Multidetector CT imaging of the neck was performed using the standard protocol following the bolus administration of intravenous contrast. CONTRAST:  17mL ISOVUE-300 IOPAMIDOL (ISOVUE-300) INJECTION 61% COMPARISON:  CT neck August 25, 2016 FINDINGS: PHARYNX AND LARYNX: Focally sclerotic LEFT thyroid and cricoid cartilages consistent with tumoral involvement. Mild medial deviation RIGHT true vocal cord could reflect paralysis. Normal pharynx. SALIVARY GLANDS: Normal. THYROID: Status post thyroidectomy. 21 x 14 mm abnormal soft tissue LEFT posterior thyroid bed and appears to invade the anterior status soft [place]. Additional subcentimeter nodules inferior LEFT thyroid bed. LYMPH NODES: 10 mm round LEFT level 4 lymph node. 9 mm LEFT level 7 lymph node with additional smaller bilateral 7 lymph nodes. VASCULAR: Moderate calcific atherosclerosis aortic arch. Moderate calcific  atherosclerosis carotid bifurcations. LIMITED INTRACRANIAL: Normal. VISUALIZED ORBITS: Status post bilateral ocular lens implants. MASTOIDS AND VISUALIZED PARANASAL SINUSES: LEFT nasogastric tube. SKELETON: Nonacute.  Patient is edentulous. UPPER CHEST: Centrilobular emphysema. Multiple LEFT pulmonary nodules measuring to at least 11 mm, some of which are new from prior CT. Moderate centrilobular emphysema. Status post median sternotomy. OTHER: None. IMPRESSION: 1. Status post thyroidectomy with 21 x 14 mm mass LEFT thyroid bed consistent with invasive residual versus recurrent tumor (including suspected esophageal invasion). 2. New pathologic level for lymphadenopathy with probable additional smaller alignment lymph nodes LEFT level 7. 3. Multiple pulmonary nodules, some which are new from prior CT most compatible with progressive metastasis.  Aortic Atherosclerosis (ICD10-I70.0) and Emphysema (ICD10-J43.9). Electronically Signed   By: Elon Alas M.D.   On: 02/22/2018 21:14   Dg Chest Port 1 View  Result Date: 02/22/2018 CLINICAL DATA:  Dyspnea. History of COPD, previous MI, atrial flutter, chronic CHF. Former smoker. EXAM: PORTABLE CHEST 1 VIEW COMPARISON:  PA and lateral chest x-ray of February 18, 2018. FINDINGS: The lungs are adequately inflated and clear. The heart and pulmonary vascularity are normal. The mediastinum is normal in width. There are post CABG changes. There is calcification in the wall of the aortic arch. IMPRESSION: Previous CABG. No pneumonia, CHF, nor other acute cardiopulmonary abnormality. Electronically Signed   By: David  Martinique M.D.   On: 02/22/2018 14:22   Dg Addison Bailey G Tube Plc W/fl W/rad  Result Date: 02/15/2018 CLINICAL DATA:  Unable to swallow.  Need for feeding tube EXAM: NASO G TUBE PLACEMENT WITH FL AND WITH RAD CONTRAST:  10 mL Isovue contrast injected to the feeding tube. FLUOROSCOPY TIME:  Fluoroscopy Time:  8 minutes 4 seconds Radiation Exposure Index (if provided by  the fluoroscopic device): Number of Acquired Spot Images: 0 COMPARISON:  Speech pathology study 02/15/2018 FINDINGS: The technologist initially attempted to place the tube, and had difficulty passing the tube through the pharynx into the esophagus. My presence was requested. After some effort, the tube was advanced into the stomach using a Glidewire. The tube was placed in the antrum however I was not able to get the tube into the duodenum despite multiple attempts with the tube and wire. Tube was left in the gastric antrum IMPRESSION: Feeding tube placed with the tip in the gastric antrum. I was not able to get the tube into the duodenum. Electronically Signed   By: Franchot Gallo M.D.   On: 02/15/2018 14:00   Dg Swallowing Func-speech Pathology  Result Date: 02/19/2018 Objective Swallowing Evaluation: Type of Study: MBS-Modified Barium Swallow Study  Patient Details Name: John Sampson MRN: [id number] Date of Birth: [birth year] Today's Date: 02/19/2018 Time: SLP Start Time (ACUTE ONLY): 1215 -SLP Stop Time (ACUTE ONLY): 1240 SLP Time Calculation (min) (ACUTE ONLY): 25 min Past Medical History: Past Medical History: Diagnosis Date . Atrial flutter ([place])   on Eliquis . AV malformation of GI tract  . COPD (chronic obstructive pulmonary disease) ([place]) 3.12.14  2D Echo, EF 50-55% . Coronary atherosclerosis of native coronary artery   Multivessel status post CABG . Gastric ulcer   Small - nonbleeding . GERD (gastroesophageal reflux disease)  . Headache(784.0)  . Hypothyroidism  . Iron deficiency anemia   Negative Givens capsule study  . Myocardial infarction ([place])  . PAD (peripheral artery disease) (H[facility])   Moderate bilateral SFA disease at angiography 01/2013 . Pituitary macroadenoma ([place])  . Thyroid cancer ([place])  . Vocal cord paralysis   left Past Surgical History: Past Surgical History: Procedure Laterality Date . ABDOMINAL AORTAGRAM N/A 02/19/2013  Procedure: ABDOMINAL Maxcine Ham;  Surgeon: [name], MD;   Location: [facility] CATH LAB;  Service: Cardiovascular;  Laterality: N/A; . CARDIAC CATHETERIZATION   . CARDIOVERSION N/A 06/16/2015  Procedure: CARDIOVERSION;  Surgeon: Satira Sark, MD;  [facility];  Service: Cardiovascular;  Laterality: N/A; . COLONOSCOPY  08/18/2012  Procedure: COLONOSCOPY;  Surgeon: [name], MD;  [facility];  Service: Endoscopy;  Laterality: N/A;  1:25 . COLONOSCOPY N/A 09/12/2017  Procedure: COLONOSCOPY;  Surgeon: [name], MD;  [facility];  Service: Endoscopy;  Laterality: N/A; . CORONARY ARTERY BYPASS  GRAFT  2003  5 grafts - details not clear . CRANIOTOMY  12/31/2011  Procedure: CRANIOTOMY HYPOPHYSECTOMY TRANSNASAL APPROACH;  Surgeon: [name], MD;  Location: [place] NEURO ORS;  Service: Neurosurgery;  Laterality: N/A;  Transphenoidal Hypophysectomy With Fat Graft Harvest from right abdomen  . ESOPHAGOGASTRODUODENOSCOPY  08/18/2012  Procedure: ESOPHAGOGASTRODUODENOSCOPY (EGD);  Surgeon: [name], MD;  [facility];  Service: Endoscopy;  Laterality: N/A; . ESOPHAGOGASTRODUODENOSCOPY N/A 03/31/2017  Procedure: ESOPHAGOGASTRODUODENOSCOPY (EGD);  Surgeon: [name], MD;  [facility];  Service: Endoscopy;  Laterality: N/A; . ESOPHAGOGASTRODUODENOSCOPY N/A 09/12/2017  Procedure: ESOPHAGOGASTRODUODENOSCOPY (EGD);  Surgeon: [name], MD;  [facility];  Service: Endoscopy;  Laterality: N/A;  10:40 . EYE SURGERY  2012 . GIVENS CAPSULE STUDY N/A 01/25/2013  Procedure: GIVENS CAPSULE STUDY;  Surgeon: [name], MD;  [facility];  Service: Endoscopy;  Laterality: N/A;  730 . HOT HEMOSTASIS  03/31/2017  Procedure: HOT HEMOSTASIS (ARGON PLASMA COAGULATION/BICAP);  Surgeon: [name], MD;  [facility];  Service: Endoscopy;;  duodenum . LARYNGOPLASTY Left 09/14/2016  Procedure: LARYNGOPLASTY;  Surgeon: [name], MD;  Location: [place];  Service: ENT;  Laterality: Left; .  LOWER EXTREMITY ANGIOGRAM N/A 02/19/2013  Procedure: LOWER EXTREMITY ANGIOGRAM;  Surgeon: [name], MD;  Location: [facility] CATH LAB;  Service: Cardiovascular;  Laterality: N/A; . MICROLARYNGOSCOPY N/A 02/10/2018  Procedure: MICROLARYNGOSCOPY WITH VOCAL CORD INJECTION;  Surgeon: [name], MD;  Location: [place];  Service: ENT;  Laterality: N/A; . PERIPHERAL VASCULAR CATHETERIZATION N/A 01/19/2016  Procedure: Abdominal Aortogram;  Surgeon: Conrad Twentynine Palms, MD;  Location: [place] CV LAB;  Service: Cardiovascular;  Laterality: N/A; . PERIPHERAL VASCULAR CATHETERIZATION Bilateral 01/19/2016  Procedure: Lower Extremity Angiography;  Surgeon: Conrad , MD;  Location: [place] CV LAB;  Service: Cardiovascular;  Laterality: Bilateral; . PV Angiogram  02/19/13  Indications: slow healing left calf ulcer . Stress Myocardial Perfusion  12/08/2011  Indications: Abnormal EKG, Eval of prior GABG . TEE WITHOUT CARDIOVERSION N/A 06/16/2015  Procedure: TRANSESOPHAGEAL ECHOCARDIOGRAM (TEE);  Surgeon: Satira Sark, MD;  [facility];  Service: Cardiovascular;  Laterality: N/A; . THYROIDECTOMY N/A 09/14/2016  Procedure: TOTAL THYROIDECTOMY;  Surgeon: [name], MD;  Location: [place];  Service: ENT;  Laterality: N/A; . 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 Also with total thyroidectomy and laryngoplasty. He presented to [facility] increasing SOB and hoarseness. He was taken to the OR on 3/15 by Dr. Lucia Gaskins for injection of left vocal cord to strengthen his voice. He presented today to Dr. Pollie Friar office and there was concern for dehydration and dysphagia so he was sent to [facility] for direct admission (actually observation). Since the procedure, he has been able to have any solid or liquid food - it just won't go down or it goes down and comes right back up. Also  unable to swallow any pills. He had the procedure Friday and did fine. He was told to go home with a soft diet and he was able to eat and drink that night. He ate breakfast ok Saturday but he wasn't able to eat after that. Dr. Shirlee More that on in-office exam the vocal cords are clear without swelling or infection but the patient was unable to swallow water in his office. He recommends IVF, labs, modified barium swallow evaluationfor  esophageal obstruction/aspiration.  Subjective: Alert, cooperative Assessment / Plan / Recommendation CHL IP CLINICAL IMPRESSIONS 02/19/2018 Clinical Impression Patient presents with improving, more moderate cervical esophageal dysphagia due to obstructing tissue at C5/6 and C7. Pt is able to pass approximately ~50% bolus through the cervical esophagus in head neutral position, however there is penetration of backflow after the swallow (worse with liquids) with all consistencies, and there is sensed aspiration with thin liquids. Compensatory manuevers attempted, and most effective was head turn left with puree and honey-thick liquids and multiple dry swallows to decrease residue. Pt then able to hock to expectorate mild residue that remained in the vallecule. Head turn did not prevent penetration of backflow of thin and nectar thick liquids from the cervical esophagus. Chin tuck was not effective. Pt educated in real time using video feedback re: use of strategies and frequent oral care to reduce aspiration risk. Recommend inititating dys 1 (puree) and honey thick liquids with head turn left and multiple swallows per bite, cough or hock to expectorate periodically. As consuming POs will be a lengthy and laborious process due to number of swallows required, would consider continuation of cortrak for now as pt may have difficulty maintaining adequate nutrition/hydration with POs alone. Pt encouraged to complete thorough oral care before and after meals.  SLP Visit Diagnosis  Dysphagia, pharyngoesophageal phase (R13.14) Attention and concentration deficit following -- Frontal lobe and executive function deficit following -- Impact on safety and function Moderate aspiration risk   CHL IP TREATMENT RECOMMENDATION 02/19/2018 Treatment Recommendations Therapy as outlined in treatment plan below   Prognosis 02/19/2018 Prognosis for Safe Diet Advancement Good Barriers to Reach Goals -- Barriers/Prognosis Comment -- CHL IP DIET RECOMMENDATION 02/19/2018 SLP Diet Recommendations Dysphagia 1 (Puree) solids;Honey thick liquids;Alternative means - temporary Liquid Administration via Cup;Spoon Medication Administration Via alternative means Compensations Multiple dry swallows after each bite/sip;Effortful swallow;Other (Comment) Postural Changes Seated upright at 90 degrees   CHL IP OTHER RECOMMENDATIONS 02/19/2018 Recommended Consults -- Oral Care Recommendations Oral care before and after PO Other Recommendations Order thickener from pharmacy;Prohibited food (jello, ice cream, thin soups);Remove water pitcher;Have oral suction available   CHL IP FOLLOW UP RECOMMENDATIONS 02/19/2018 Follow up Recommendations Other (comment)   CHL IP FREQUENCY AND DURATION 02/19/2018 Speech Therapy Frequency (ACUTE ONLY) min 2x/week Treatment Duration 2 weeks      CHL IP ORAL PHASE 02/19/2018 Oral Phase WFL Oral - Pudding Teaspoon -- Oral - Pudding Cup -- Oral - Honey Teaspoon -- Oral - Honey Cup -- Oral - Nectar Teaspoon -- Oral - Nectar Cup -- Oral - Nectar Straw -- Oral - Thin Teaspoon -- Oral - Thin Cup -- Oral - Thin Straw -- Oral - Puree -- Oral - Mech Soft -- Oral - Regular -- Oral - Multi-Consistency -- Oral - Pill -- Oral Phase - Comment --  CHL IP PHARYNGEAL PHASE 02/19/2018 Pharyngeal Phase WFL Pharyngeal- Pudding Teaspoon -- Pharyngeal -- Pharyngeal- Pudding Cup -- Pharyngeal -- Pharyngeal- Honey Teaspoon -- Pharyngeal -- Pharyngeal- Honey Cup -- Pharyngeal -- Pharyngeal- Nectar Teaspoon -- Pharyngeal --  Pharyngeal- Nectar Cup -- Pharyngeal -- Pharyngeal- Nectar Straw -- Pharyngeal -- Pharyngeal- Thin Teaspoon -- Pharyngeal -- Pharyngeal- Thin Cup -- Pharyngeal -- Pharyngeal- Thin Straw -- Pharyngeal -- Pharyngeal- Puree -- Pharyngeal -- Pharyngeal- Mechanical Soft -- Pharyngeal -- Pharyngeal- Regular -- Pharyngeal -- Pharyngeal- Multi-consistency -- Pharyngeal -- Pharyngeal- Pill -- Pharyngeal -- Pharyngeal Comment --  CHL IP CERVICAL ESOPHAGEAL PHASE 02/19/2018 Cervical Esophageal Phase Impaired Pudding Teaspoon -- Pudding  Cup -- Honey Teaspoon Reduced cricopharyngeal relaxation;Esophageal backflow into the pharynx;Esophageal backflow into the larynx Honey Cup Reduced cricopharyngeal relaxation;Esophageal backflow into the pharynx;Esophageal backflow into the larynx Nectar Teaspoon -- Nectar Cup Reduced cricopharyngeal relaxation;Esophageal backflow into the pharynx;Esophageal backflow into the larynx Nectar Straw -- Thin Teaspoon Reduced cricopharyngeal relaxation;Esophageal backflow into the pharynx;Esophageal backflow into the larynx Thin Cup Reduced cricopharyngeal relaxation;Esophageal backflow into the pharynx;Esophageal backflow into the larynx Thin Straw NT Puree Reduced cricopharyngeal relaxation;Esophageal backflow into the pharynx;Esophageal backflow into the larynx Mechanical Soft -- Regular -- Multi-consistency -- Pill -- Cervical Esophageal Comment head turn left prevents penetration with honey thick liquids, puree only Deneise Lever, MS, [place] [id number] No flowsheet data found. Aliene Altes 02/19/2018, 1:58 PM              Dg Swallowing Func-speech Pathology  Result Date: 02/15/2018 Objective Swallowing Evaluation: Type of Study: MBS-Modified Barium Swallow Study  Patient Details Name: John Sampson MRN: [id number] Date of Birth: 02/12/1936 Today's Date: 02/15/2018 Time: SLP Start Time (ACUTE ONLY): 1000 -SLP Stop Time (ACUTE ONLY): 1027 SLP Time Calculation (min)  (ACUTE ONLY): 27 min Past Medical History: Past Medical History: Diagnosis Date . Atrial flutter ([place])   on Eliquis . AV malformation of GI tract  . COPD (chronic obstructive pulmonary disease) ([place]) 3.12.14  2D Echo, EF 50-55% . Coronary atherosclerosis of native coronary artery   Multivessel status post CABG . Gastric ulcer   Small - nonbleeding . GERD (gastroesophageal reflux disease)  . Headache(784.0)  . Hypothyroidism  . Iron deficiency anemia   Negative Givens capsule study  . Myocardial infarction ([place])  . PAD (peripheral artery disease) ([facility])   Moderate bilateral SFA disease at angiography 01/2013 . Pituitary macroadenoma ([place])  . Thyroid cancer ([place])  . Vocal cord paralysis   left Past Surgical History: Past Surgical History: Procedure Laterality Date . ABDOMINAL AORTAGRAM N/A 02/19/2013  Procedure: ABDOMINAL Maxcine Ham;  Surgeon: [name], MD;  Location[facility] C[facility] LAB;  Service: Cardiovascular;  Laterality: N/A; . CARDIAC CATHETERIZATION   . CARDIOVERSION N/A 06/16/2015  Procedure: CARDIOVERSION;  Surgeon: Satira Sark, MD;  Location: [facility] ORS;  Service: Cardiovascular;  Laterality: N/A; . COLONOSCOPY  08/18/2012  Procedure: COLONOSCOPY;  Surgeon: [name], MD;  [facility];  Service: Endoscopy;  Laterality: N/A;  1:25 . COLONOSCOPY N/A 09/12/2017  Procedure: COLONOSCOPY;  Surgeon: [name], MD;  [facility];  Service: Endoscopy;  Laterality: N/A; . CORONARY ARTERY BYPASS GRAFT  2003  5 grafts - details not clear . CRANIOTOMY  12/31/2011  Procedure: CRANIOTOMY HYPOPHYSECTOMY TRANSNASAL APPROACH;  Surgeon: [name], MD;  Location: [place] NEURO ORS;  Service: Neurosurgery;  Laterality: N/A;  Transphenoidal Hypophysectomy With Fat Graft Harvest from right abdomen  . ESOPHAGOGASTRODUODENOSCOPY  08/18/2012  Procedure: ESOPHAGOGASTRODUODENOSCOPY (EGD);  Surgeon: [name], MD;  [facility];  Service: Endoscopy;  Laterality: N/A; .  ESOPHAGOGASTRODUODENOSCOPY N/A 03/31/2017  Procedure: ESOPHAGOGASTRODUODENOSCOPY (EGD);  Surgeon: [name], MD;  [facility];  Service: Endoscopy;  Laterality: N/A; . ESOPHAGOGASTRODUODENOSCOPY N/A 09/12/2017  Procedure: ESOPHAGOGASTRODUODENOSCOPY (EGD);  Surgeon: [name], MD;  [facility];  Service: Endoscopy;  Laterality: N/A;  10:40 . EYE SURGERY  2012 . GIVENS CAPSULE STUDY N/A 01/25/2013  Procedure: GIVENS CAPSULE STUDY;  Surgeon: [name], MD;  [facility];  Service: Endoscopy;  Laterality: N/A;  730 . HOT HEMOSTASIS  03/31/2017  Procedure:  HOT HEMOSTASIS (ARGON PLASMA COAGULATION/BICAP);  Surgeon: [name], MD;  [facility];  Service: Endoscopy;;  duodenum . LARYNGOPLASTY Left 09/14/2016  Procedure: LARYNGOPLASTY;  Surgeon: [name], MD;  Location: [place];  Service: ENT;  Laterality: Left; . LOWER EXTREMITY ANGIOGRAM N/A 02/19/2013  Procedure: LOWER EXTREMITY ANGIOGRAM;  Surgeon: [name], MD;  Location: [facility] CATH LAB;  Service: Cardiovascular;  Laterality: N/A; . MICROLARYNGOSCOPY N/A 02/10/2018  Procedure: MICROLARYNGOSCOPY WITH VOCAL CORD INJECTION;  Surgeon: [name], MD;  Location: [place];  Service: ENT;  Laterality: N/A; . PERIPHERAL VASCULAR CATHETERIZATION N/A 01/19/2016  Procedure: Abdominal Aortogram;  Surgeon: Conrad Holcomb, MD;  Location: [place] CV LAB;  Service: Cardiovascular;  Laterality: N/A; . PERIPHERAL VASCULAR CATHETERIZATION Bilateral 01/19/2016  Procedure: Lower Extremity Angiography;  Surgeon: Conrad Sanford, MD;  Location: [place] CV LAB;  Service: Cardiovascular;  Laterality: Bilateral; . PV Angiogram  02/19/13  Indications: slow healing left calf ulcer . Stress Myocardial Perfusion  12/08/2011  Indications: Abnormal EKG, Eval of prior GABG . TEE WITHOUT CARDIOVERSION N/A 06/16/2015  Procedure: TRANSESOPHAGEAL ECHOCARDIOGRAM (TEE);  Surgeon: Satira Sark, MD;   [facility];  Service: Cardiovascular;  Laterality: N/A; . THYROIDECTOMY N/A 09/14/2016  Procedure: TOTAL THYROIDECTOMY;  Surgeon: [name], MD;  Location: [place];  Service: ENT;  Laterality: N/A; . 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 Also with total thyroidectomy and laryngoplasty. He presented to [facility] increasing SOB and hoarseness. He was taken to the OR on 3/15 by Dr. Lucia Gaskins for injection of left vocal cord to strengthen his voice. He presented today to Dr. Pollie Friar office and there was concern for dehydration and dysphagia so he was sent to [facility] for direct admission (actually observation). Since the procedure, he has been able to have any solid or liquid food - it just won't go down or it goes down and comes right back up. Also unable to swallow any pills. He had the procedure Friday and did fine. He was told to go home with a soft diet and he was able to eat and drink that night. He ate breakfast ok Saturday but he wasn't able to eat after that. Dr. Shirlee More that on in-office exam the vocal cords are clear without swelling or infection but the patient was unable to swallow water in his office. He recommends IVF, labs, modified barium swallow evaluationfor esophageal obstruction/aspiration.  No Data Recorded Assessment / Plan / Recommendation CHL IP CLINICAL IMPRESSIONS 02/15/2018 Clinical Impression Pt demosntrates a severe cervical esophageal dysphagia with obstructing tissue at C5/6 and C7. Pt is unable to pass 1/4 teaspoon of thin liquids despite maximal effortful swallow or a head turn R or L. Chin tuck also ineffective. Attempted large bolus to adress if heavier propulsion could force more opening without success. 90% or more of the bolus returned to the pharynx and was aspriated with sensation. Pt does have a hard cough ability and can expectorate  residue. Recommend pt remain fully NPO. Discussed result with MD, will need ENT to review study images and address needs. Pt may need a temporary means of nutrition, though passage of any NG would likely be difficult even under fluoroscopy and could be contraindicated. Esophagram discontinued due to pt inability to consume any quantity of barium for further esophageal assessment.  SLP Visit Diagnosis Dysphagia, pharyngoesophageal phase (R13.14) Attention and concentration  deficit following -- Frontal lobe and executive function deficit following -- Impact on safety and function Severe aspiration risk;Risk for inadequate nutrition/hydration   CHL IP TREATMENT RECOMMENDATION 02/15/2018 Treatment Recommendations Therapy as outlined in treatment plan below   Prognosis 02/15/2018 Prognosis for Safe Diet Advancement Good Barriers to Reach Goals -- Barriers/Prognosis Comment -- CHL IP DIET RECOMMENDATION 02/15/2018 SLP Diet Recommendations NPO;Alternative means - temporary Liquid Administration via -- Medication Administration Via alternative means Compensations -- Postural Changes --   CHL IP OTHER RECOMMENDATIONS 02/15/2018 Recommended Consults Consider ENT evaluation Oral Care Recommendations -- Other Recommendations --   CHL IP FOLLOW UP RECOMMENDATIONS 02/15/2018 Follow up Recommendations 24 hour supervision/assistance   CHL IP FREQUENCY AND DURATION 02/15/2018 Speech Therapy Frequency (ACUTE ONLY) min 2x/week Treatment Duration 2 weeks      CHL IP ORAL PHASE 02/15/2018 Oral Phase WFL Oral - Pudding Teaspoon -- Oral - Pudding Cup -- Oral - Honey Teaspoon -- Oral - Honey Cup -- Oral - Nectar Teaspoon -- Oral - Nectar Cup -- Oral - Nectar Straw -- Oral - Thin Teaspoon -- Oral - Thin Cup -- Oral - Thin Straw -- Oral - Puree -- Oral - Mech Soft -- Oral - Regular -- Oral - Multi-Consistency -- Oral - Pill -- Oral Phase - Comment --  CHL IP PHARYNGEAL PHASE 02/15/2018 Pharyngeal Phase WFL Pharyngeal- Pudding Teaspoon -- Pharyngeal  -- Pharyngeal- Pudding Cup -- Pharyngeal -- Pharyngeal- Honey Teaspoon -- Pharyngeal -- Pharyngeal- Honey Cup -- Pharyngeal -- Pharyngeal- Nectar Teaspoon -- Pharyngeal -- Pharyngeal- Nectar Cup -- Pharyngeal -- Pharyngeal- Nectar Straw -- Pharyngeal -- Pharyngeal- Thin Teaspoon -- Pharyngeal -- Pharyngeal- Thin Cup -- Pharyngeal -- Pharyngeal- Thin Straw -- Pharyngeal -- Pharyngeal- Puree -- Pharyngeal -- Pharyngeal- Mechanical Soft -- Pharyngeal -- Pharyngeal- Regular -- Pharyngeal -- Pharyngeal- Multi-consistency -- Pharyngeal -- Pharyngeal- Pill -- Pharyngeal -- Pharyngeal Comment --  CHL IP CERVICAL ESOPHAGEAL PHASE 02/15/2018 Cervical Esophageal Phase Impaired Pudding Teaspoon -- Pudding Cup -- Honey Teaspoon -- Honey Cup -- Nectar Teaspoon -- Nectar Cup -- Nectar Straw -- Thin Teaspoon Reduced cricopharyngeal relaxation;Esophageal backflow into the pharynx;Esophageal backflow into the larynx Thin Cup -- Thin Straw Reduced cricopharyngeal relaxation;Esophageal backflow into the pharynx;Esophageal backflow into the larynx Puree -- Mechanical Soft -- Regular -- Multi-consistency -- Pill -- Cervical Esophageal Comment -- No flowsheet data found. DeBlo[place], Katherene Ponto 02/15/2018, 10:41 AM               Labs:  CBC: Recent Labs    02/20/18 0512 02/21/18 1037 02/22/18 0425 02/23/18 0406  WBC 21.8* 20.4* 22.8* 24.6*  HGB 12.6* 12.0* 11.8* 12.0*  HCT 40.6 39.1 39.4 38.9*  PLT 325 274 263 254    COAGS: Recent Labs    02/23/18 2053  INR 1.00    BMP: Recent Labs    02/19/18 0601 02/20/18 0512 02/22/18 0425 02/23/18 0406  NA 137 134* 134* 134*  K 4.5 5.0 5.1 3.8  CL 101 99* 96* 96*  CO2 27 25 30 28   GLUCOSE 136* 142* 149* 130*  BUN 37* 49* 48* 43*  CALCIUM 9.2 8.7* 8.7* 8.7*  CREATININE 0.84 0.92 1.03 0.98  GFRNONAA >60 >60 >60 >60  GFRAA >60 >60 >60 >60    LIVER FUNCTION TESTS: Recent Labs    03/31/17 0544 04/27/17 1755 02/14/18 1601  BILITOT 0.6 0.5 1.3*  AST 17 23 30     ALT 15* 18 22  ALKPHOS 56 71 62  PROT 6.3* 6.8 6.7  ALBUMIN 3.7  4.0 3.6    TUMOR MARKERS: No results for input(s): AFPTM, CEA, CA199, CHROMGRNA in the last 8760 hours.  Assessment and Plan:  Thyroid cancer Dysphagia Malnutrition Scheduled for percutaneous gastric tube placement Risks and benefits discussed with the patient including, but not limited to the need for a barium enema during the procedure, bleeding, infection, peritonitis, or damage to adjacent structures.  All of the patient's questions were answered, patient is agreeable to proceed. Consent signed and in chart.   Thank you for this interesting consult.  I greatly enjoyed meeting ELIAN GLOSTER and look forward to participating in their care.  A copy of this report was sent to the requesting provider on this date.  Electronically Signed: Lavonia Drafts, PA-C 02/24/2018, 10:43 AM   I spent a total of 40 Minutes    in face to face in clinical consultation, greater than 50% of which was counseling/coordinating care for percutaneous gastric tube placement

## 2018-02-24 NOTE — Progress Notes (Signed)
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 He presented to [facility] increasing SOB and hoarseness found to have VOcal cord paralysis. He was taken to the OR on 3/15 by Dr. Lucia Gaskins and underwent prolaryn injection to vocal cords, then presented 3/19  to Dr. Pollie Friar office and there was concern for dehydration and dysphagia so he was sent to [facility] for direct admission. Since the procedure, he has been able to have any solid or liquid food . Will Modified Barium swallow 3/20 with severe cervical esophageal swelling with resultant dysphagia with obstructing tissue at C5/6 and C7. Pt is unable to pass1/4 teaspoon of thin liquids despite maximal effortful swallow  -ENT consulted, 3/20 started steroids and Abx, Cortrak placed for tube feeds -no significant improvement in dysphagia yet -CT neck 3/27 fairly extensive recurrence of his thyroid cancer adjacent to the trachea as well as into the anterior wall of his esophagus and probable lung mets  Assessment & Plan: Principal Problem:   Dysphagia Active Problems:   Essential hypertension, benign   Mixed hyperlipidemia   PAF (paroxysmal atrial fibrillation) (HCC)   Chronic diastolic congestive heart failure (HCC)   Hypothyroidism (acquired)   Abrasion of lower leg, left, initial encounter   Protein-calorie malnutrition, severe   Severe Cervical esophageal swelling with dysphagia  -Status post prolaryn injection to vocal cords 3/15 for vocal cord paralysis, since then had profound dysphagia, MBS 3/20 which noted Severe cervical esophageal swelling with resultant dysphagia with obstructing tissue at C5/6 and C7. -ENT, Dr.Newman following, treated with 1 week  of  ABx and IV decadron, no significant improvement in his dysphagia and continues to have significant cervical esophageal swelling -CT neck 3/27 completed showed extensive recurrence of his thyroid cancer adjacent to the trachea and involving esophagus and probable lung metastases, stopped Decadron -We agreed to place PEG tube after discussion with patient and son, and Dr. Lucia Gaskins will discuss with radiation oncology regarding options for his aggressive papillary thyroid cancer which appears to be metastatic at this time -IR consulted for PEG tube, remove cortrak when PEG placed in -continue D1 diet with thickened liquids simultaneously -Appreciate Dr. Lucia Gaskins input -Tentative plan for PEG placement today  Aggressive papillary thyroid CA -history of left papillary thyroid carcinoma with paralysis of vocal cord -history of thyroidectomy and laryngoplasty about 2 years ago -appears to have Lung mets based this scan -ENT Dr.NEwman to d/w Radonc and Endocrine regarding options to Rx this   leukocytosis -most likely secondary to underlying aggressive malignancy, clinically no evidence of active infection at this point, -Stop IV Zosyn 3/30, will have completed a ten-day total course of antibiotics -steroids were suspected to be contributing as well, I stopped his Decadron  P.Afib on Xarelto -in NSR, and HR controlled -hold Xarelto and Lovenox for PEG tube placement, resume anticoagulation as soon as it is okay with interventional radiology -PO metoprolol and Diltiazem on hold, will resume once PEG tube is seen and can be used -continue IV lopressor  -Rate controlled with heart rate in the 60s  H/o Hypothyroidism -currently hyperthyroid by labs, oversupressed -recheck levels and resume synthroid at very small dose in 4-6weeks  HTN -unable to tolerate PO, IV metoprolol and when necessary hydralazine for now -Blood pressure  stable 145/55 this morning, resume p.o. once PEG tube is  in  HLD -Hold Zocor   Acute on Chronic diastolic heart failure -diuresed with IV lasix 3/25, and 3/26 -monitor Volume status closely -clinically appears euvolemic now, weight 191 pounds this morning, closely monitor  R leg abrasion/skin tear -no s/s of infection, [facility] consult appreciated    DVT prophylaxis: Lovenox, now on hold Code Status: DNR Family Communication: no family at bedside Disposition Plan: SNF next week   Consultants:   ENT  Procedures:   MBS x 2  Antimicrobials:   none   Subjective: - no chest pain, shortness of breath, no abdominal pain, nausea or vomiting.  Awaiting procedure  Objective: Vitals:   02/23/18 1858 02/23/18 2022 02/24/18 0450 02/24/18 0459  BP: (!) 131/50 (!) 135/52 (!) 145/55   Pulse: 66 60 60   Resp:  20 18   Temp:  98.4 F (36.9 C) 97.9 F (36.6 C)   TempSrc:  Oral Oral   SpO2: 99% 96% 97%   Weight:    87 kg (191 lb 12.8 oz)  Height:        Intake/Output Summary (Last 24 hours) at 02/24/2018 1021 Last data filed at 02/24/2018 0537 Gross per 24 hour  Intake 1555 ml  Output 2625 ml  Net -1070 ml   Filed Weights   02/22/18 0415 02/23/18 0611 02/24/18 0459  Weight: 88.6 kg (195 lb 5.2 oz) 86.3 kg (190 lb 3.2 oz) 87 kg (191 lb 12.8 oz)    Examination:  Constitutional: NAD Eyes: lids and conjunctivae normal ENMT: Mucous membranes are moist. No oropharyngeal exudates Respiratory: clear to auscultation bilaterally, no wheezing, no crackles. Normal respiratory effort. No accessory muscle use.  Cardiovascular: Regular rate and rhythm, no murmurs / rubs / gallops. No LE edema. Abdomen: no tenderness. Bowel sounds positive.  Skin: no rashes Neurologic: non focal    Data Reviewed: I have independently reviewed following labs and imaging studies   CBC: Recent Labs  Lab 02/19/18 0601 02/20/18 0512 02/21/18 1037 02/22/18 0425 02/23/18 0406  WBC 19.6* 21.8* 20.4* 22.8* 24.6*  HGB 12.5* 12.6* 12.0* 11.8* 12.0*  HCT  40.6 40.6 39.1 39.4 38.9*  MCV 84.4 84.6 85.6 84.7 83.3  PLT 318 325 274 263 166   Basic Metabolic Panel: Recent Labs  Lab 02/18/18 0621 02/19/18 0601 02/20/18 0512 02/22/18 0425 02/23/18 0406  NA 137 137 134* 134* 134*  K 4.3 4.5 5.0 5.1 3.8  CL 100* 101 99* 96* 96*  CO2 26 27 25 30 28   GLUCOSE 130* 136* 142* 149* 130*  BUN 28* 37* 49* 48* 43*  CREATININE 0.78 0.84 0.92 1.03 0.98  CALCIUM 9.1 9.2 8.7* 8.7* 8.7*   GFR: Estimated Creatinine Clearance: 65.6 mL/min (by C-G formula based on SCr of 0.98 mg/dL). Liver Function Tests: No results for input(s): AST, ALT, ALKPHOS, BILITOT, PROT, ALBUMIN in the last 168 hours. No results for input(s): LIPASE, AMYLASE in the last 168 hours. No results for input(s): AMMONIA in the last 168 hours. Coagulation Profile: Recent Labs  Lab 02/23/18 2053  INR 1.00   Cardiac Enzymes: No results for input(s): CKTOTAL, CKMB, CKMBINDEX, TROPONINI in the last 168 hours. BNP (last 3 results) No results for input(s): PROBNP in the last 8760 hours. HbA1C: No results for input(s): HGBA1C in the last 72 hours. CBG: Recent Labs  Lab 02/23/18 1648 02/23/18 2008 02/24/18 0025 02/24/18 0447 02/24/18 0752  GLUCAP 126* 110* 111* 89 97   Lipid Profile: No  results for input(s): CHOL, HDL, LDLCALC, TRIG, CHOLHDL, LDLDIRECT in the last 72 hours. Thyroid Function Tests: No results for input(s): TSH, T4TOTAL, FREET4, T3FREE, THYROIDAB in the last 72 hours. Anemia Panel: No results for input(s): VITAMINB12, FOLATE, FERRITIN, TIBC, IRON, RETICCTPCT in the last 72 hours. Urine analysis:    Component Value Date/Time   COLORURINE YELLOW 12/06/2017 1215   APPEARANCEUR HAZY (A) 12/06/2017 1215   LABSPEC 1.021 12/06/2017 1215   PHURINE 5.0 12/06/2017 1215   GLUCOSEU NEGATIVE 12/06/2017 1215   HGBUR LARGE (A) 12/06/2017 1215   BILIRUBINUR NEGATIVE 12/06/2017 1215   KETONESUR NEGATIVE 12/06/2017 1215   PROTEINUR 30 (A) 12/06/2017 1215   NITRITE  NEGATIVE 12/06/2017 1215   LEUKOCYTESUR TRACE (A) 12/06/2017 1215   Sepsis Labs: Invalid input(s): PROCALCITONIN, LACTICIDVEN  No results found for this or any previous visit (from the past 240 hour(s)).    Radiology Studies: Ct Abdomen Wo Contrast  Result Date: 02/23/2018 CLINICAL DATA:  Dysphagia, aspiration risk, assess anatomy for gastrostomy insertion EXAM: CT ABDOMEN WITHOUT CONTRAST TECHNIQUE: Multidetector CT imaging of the abdomen was performed following the standard protocol without IV contrast. COMPARISON:  02/22/2018 modified swallow FINDINGS: Lower chest: Several bilateral lower chest pulmonary nodules all measuring 5 mm or less in size within the lower lobes, inferior right middle lobe, and lingula Right basilar atelectasis versus scarring also noted. Normal heart size. Native coronary atherosclerosis. Previous median sternotomy evident. Feeding tube extends into the stomach distally. Hepatobiliary: Cholelithiasis evident. No focal hepatic abnormality. No biliary dilatation or obstruction. Pancreas: No focal abnormality or ductal dilatation. No surrounding inflammatory process. Spleen: Normal in size. Adrenals/Urinary Tract: Normal adrenal glands. Kidneys demonstrate no acute obstructive uropathy or hydronephrosis. Ureters are symmetric and decompressed. Mild chronic perinephric strandy edema. Indeterminate posterior left renal hyperdense cortical lesion measures 10 mm this may represent a proteinaceous or hemorrhagic cyst. Stomach/Bowel: Feeding tube enters the stomach. No significant hiatal hernia. Stomach anatomy appears favorable for fluoroscopic gastrostomy technique. Stomach is inferior to the left hepatic lobe and the midportion of the stomach is superior to the transverse colon. Feeding tube extends into the duodenal bulb. Stomach is currently decompressed. Vascular/Lymphatic: Aortic atherosclerosis noted. Negative for aneurysm. No retroperitoneal hemorrhage or hematoma. No  abdominal adenopathy. Other: No abdominal wall hernia. No free fluid or ascites. Negative for abscess. Musculoskeletal: No acute osseous finding. Anterior L2 vertebral body lytic lesion without compression pathologic fracture. Appearance is suspicious for metastatic disease or myeloma. IMPRESSION: Stomach anatomy appears favorable for attempt at fluoroscopic gastrostomy insertion. Numerous subcentimeter bilateral pulmonary nodules suspicious for pulmonary metastases. Lytic L2 vertebral body suspicious for osseous metastasis. Atherosclerosis without aneurysm Cholelithiasis Electronically Signed   By: Jerilynn Mages.  Shick M.D.   On: 02/23/2018 16:06   Ct Soft Tissue Neck W Contrast  Result Date: 02/22/2018 CLINICAL DATA:  Improving dysphagia. Status post laryngoscopy. History of thyroid cancer, thyroidectomy, laryngoplasty, pituitary macroadenoma. EXAM: CT NECK WITH CONTRAST TECHNIQUE: Multidetector CT imaging of the neck was performed using the standard protocol following the bolus administration of intravenous contrast. CONTRAST:  3mL ISOVUE-300 IOPAMIDOL (ISOVUE-300) INJECTION 61% COMPARISON:  CT neck August 25, 2016 FINDINGS: PHARYNX AND LARYNX: Focally sclerotic LEFT thyroid and cricoid cartilages consistent with tumoral involvement. Mild medial deviation RIGHT true vocal cord could reflect paralysis. Normal pharynx. SALIVARY GLANDS: Normal. THYROID: Status post thyroidectomy. 21 x 14 mm abnormal soft tissue LEFT posterior thyroid bed and appears to invade the anterior status soft [place]. Additional subcentimeter nodules inferior LEFT thyroid bed. LYMPH NODES: 10 mm round  LEFT level 4 lymph node. 9 mm LEFT level 7 lymph node with additional smaller bilateral 7 lymph nodes. VASCULAR: Moderate calcific atherosclerosis aortic arch. Moderate calcific atherosclerosis carotid bifurcations. LIMITED INTRACRANIAL: Normal. VISUALIZED ORBITS: Status post bilateral ocular lens implants. MASTOIDS AND VISUALIZED PARANASAL  SINUSES: LEFT nasogastric tube. SKELETON: Nonacute.  Patient is edentulous. UPPER CHEST: Centrilobular emphysema. Multiple LEFT pulmonary nodules measuring to at least 11 mm, some of which are new from prior CT. Moderate centrilobular emphysema. Status post median sternotomy. OTHER: None. IMPRESSION: 1. Status post thyroidectomy with 21 x 14 mm mass LEFT thyroid bed consistent with invasive residual versus recurrent tumor (including suspected esophageal invasion). 2. New pathologic level for lymphadenopathy with probable additional smaller alignment lymph nodes LEFT level 7. 3. Multiple pulmonary nodules, some which are new from prior CT most compatible with progressive metastasis. Aortic Atherosclerosis (ICD10-I70.0) and Emphysema (ICD10-J43.9). Electronically Signed   By: Elon Alas M.D.   On: 02/22/2018 21:14   Dg Chest Port 1 View  Result Date: 02/22/2018 CLINICAL DATA:  Dyspnea. History of COPD, previous MI, atrial flutter, chronic CHF. Former smoker. EXAM: PORTABLE CHEST 1 VIEW COMPARISON:  PA and lateral chest x-ray of February 18, 2018. FINDINGS: The lungs are adequately inflated and clear. The heart and pulmonary vascularity are normal. The mediastinum is normal in width. There are post CABG changes. There is calcification in the wall of the aortic arch. IMPRESSION: Previous CABG. No pneumonia, CHF, nor other acute cardiopulmonary abnormality. Electronically Signed   By: David  Martinique M.D.   On: 02/22/2018 14:22     Scheduled Meds: . feeding supplement (PRO-STAT SUGAR FREE 64)  30 mL Per Tube BID  . free water  200 mL Per Tube QID  . mouth rinse  15 mL Mouth Rinse BID  . metoprolol tartrate  5 mg Intravenous Q6H  .  morphine injection  1 mg Intravenous Once  . mupirocin ointment   Topical Daily   Continuous Infusions: . feeding supplement (JEVITY 1.2 CAL) Stopped (02/24/18 0000)  . piperacillin-tazobactam (ZOSYN)  IV Stopped (02/24/18 0537)     Marzetta Board, MD, PhD [facility] Pager [phone]  If 7PM-7AM, please contact night-coverage [email or web address] Password [facility] 02/24/2018, 10:21 AM

## 2018-02-24 NOTE — Evaluation (Signed)
Occupational Therapy Evaluation Patient Details Name: John Sampson MRN: [id number] DOB: 1936-06-25 Today's Date: 02/24/2018    History of Present Illness John Sampson is a 82 y.o. 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  Also with total thyroidectomy and laryngoplasty.  He presented to ENT with increasing SOB and hoarseness.  He was taken to the OR on 3/15 by Dr. Lucia Gaskins for injection of left vocal cord to strengthen his voice.   He presented today to Dr. Pollie Friar office and there was concern for dehydration and dysphagia so he was sent to [facility]. Went home after this but was still unable to swallow so returned and now has NG tube. -CT neck 3/27 fairly extensive recurrence of his thyroid cancer adjacent to the trachea as well as into the anterior wall of his esophagus and probable lung mets    Clinical Impression   PTA, pt was living alone and was independent. Pt currently requiring Min A for ADLs and Min Guard A for functional mobility with RW. Pt demonstrating decreased activity tolerance as seen by quickly fatiguing during ADLs and SpO2 dropping to 81% on room air. Pt requiring increased time throughout session due to fatigue. Pt would benefit from further acute OT to facilitate safe dc. Recommend dc to SNF for further OT to optimize safety, independence with ADLs, and return to PLOF.      Follow Up Recommendations  SNF;Supervision/Assistance - 24 hour    Equipment Recommendations  Other (comment)(Defer to next venue)    Recommendations for Other Services PT consult     Precautions / Restrictions Precautions Precautions: Fall Precaution Comments: has been in bed past week Restrictions Weight Bearing Restrictions: No Other Position/Activity Restrictions: NG tube      Mobility Bed Mobility Overal bed mobility: Needs Assistance Bed Mobility: Supine to Sit;Sit to Supine     Supine to sit: Min guard Sit to supine:  Min guard   General bed mobility comments: For safety  Transfers Overall transfer level: Needs assistance Equipment used: Rolling walker (2 wheeled) Transfers: Sit to/from Stand Sit to Stand: Min assist         General transfer comment: Min A to power up into standing    Balance Overall balance assessment: Needs assistance Sitting-balance support: Single extremity supported Sitting balance-Leahy Scale: Fair     Standing balance support: Bilateral upper extremity supported Standing balance-Leahy Scale: Poor Standing balance comment: reliant on UE support                           ADL either performed or assessed with clinical judgement   ADL Overall ADL's : Needs assistance/impaired Eating/Feeding: Set up;Sitting   Grooming: Min guard;Oral care;Standing Grooming Details (indicate cue type and reason): Pt performing oral care at sink with MIn guard for safety. Pt SpO2 dropping to 81% after activity.  Upper Body Bathing: Minimal assistance;Sitting   Lower Body Bathing: Minimal assistance;Sit to/from stand   Upper Body Dressing : Minimal assistance;Sitting   Lower Body Dressing: Minimal assistance;Sit to/from stand Lower Body Dressing Details (indicate cue type and reason): Pt able to reach forward to adjust right sock Toilet Transfer: Minimal assistance;Ambulation(Simulated in room)           Functional mobility during ADLs: Min guard;Rolling walker General ADL Comments: Pt requiring Min A for dynamic standing balance. Pt with poor activity tolerance and SpO2 dropping to 81% on roomair  Vision Baseline Vision/History: Wears glasses Wears Glasses: At all times Patient Visual Report: No change from baseline       Perception     Praxis      Pertinent Vitals/Pain Pain Assessment: 0-10 Pain Score: 6  Pain Location: B feet Pain Descriptors / Indicators: Sore Pain Intervention(s): Monitored during session;Repositioned;Limited activity within  patient's tolerance     Hand Dominance Right   Extremity/Trunk Assessment Upper Extremity Assessment Upper Extremity Assessment: Generalized weakness   Lower Extremity Assessment Lower Extremity Assessment: Generalized weakness   Cervical / Trunk Assessment Cervical / Trunk Assessment: Kyphotic   Communication Communication Communication: Other (comment)(love voice quality)   Cognition Arousal/Alertness: Awake/alert Behavior During Therapy: WFL for tasks assessed/performed Overall Cognitive Status: Within Functional Limits for tasks assessed                                 General Comments: Pt requiring increased time for responding to questiosn and cues. Pt with short answers   General Comments  SpO2 93% on roomair before activity. Dropping to 81% during activity. Returns to 92% at EOB after activity on roomair    Exercises     Shoulder Instructions      Home Living Family/patient expects to be discharged to:: Private residence Living Arrangements: Alone   Type of Home: House Home Access: Stairs to enter Technical brewer of Steps: 1 Entrance Stairs-Rails: None Home Layout: One level     Bathroom Shower/Tub: Corporate investment banker: Standard     Home Equipment: None          Prior Functioning/Environment Level of Independence: Independent        Comments: ADLs, IADLs. and driving        OT Problem List: Decreased activity tolerance;Decreased strength;Impaired balance (sitting and/or standing);Decreased safety awareness;Decreased knowledge of use of DME or AE;Decreased knowledge of precautions      OT Treatment/Interventions: Self-care/ADL training;Therapeutic exercise;Energy conservation;DME and/or AE instruction;Therapeutic activities;Patient/family education    OT Goals(Current goals can be found in the care plan section) Acute Rehab OT Goals Patient Stated Goal: get rid of NG tube OT Goal Formulation: With  patient/family Time For Goal Achievement: 03/10/18 Potential to Achieve Goals: Good ADL Goals Pt Will Perform Grooming: with set-up;with supervision;standing Pt Will Perform Upper Body Dressing: with set-up;with supervision;sitting Pt Will Perform Lower Body Dressing: with set-up;with supervision;sit to/from stand Pt Will Transfer to Toilet: with set-up;with supervision;ambulating;regular height toilet Pt Will Perform Toileting - Clothing Manipulation and hygiene: with set-up;with supervision;sit to/from stand  OT Frequency: Min 2X/week   Barriers to D/C:            Co-evaluation              AM-PAC PT "6 Clicks" Daily Activity     Outcome Measure Help from another person eating meals?: None Help from another person taking care of personal grooming?: A Little Help from another person toileting, which includes using toliet, bedpan, or urinal?: A Little Help from another person bathing (including washing, rinsing, drying)?: A Little Help from another person to put on and taking off regular upper body clothing?: A Little Help from another person to put on and taking off regular lower body clothing?: A Little 6 Click Score: 19   End of Session Equipment Utilized During Treatment: Rolling walker;Gait belt Nurse Communication: Mobility status  Activity Tolerance: Patient limited by fatigue Patient left: in bed;with call bell/phone within  reach;with family/visitor present  OT Visit Diagnosis: Unsteadiness on feet (R26.81);Other abnormalities of gait and mobility (R26.89);Muscle weakness (generalized) (M62.81)                Time: 4604-7998 OT Time Calculation (min): 23 min Charges:  OT General Charges $OT Visit: 1 Visit OT Evaluation $OT Eval Moderate Complexity: 1 Mod OT Treatments $Self Care/Home Management : 8-22 mins G-Codes:     Kris Burd MSOT, OTR/L Acute Rehab Pager: [phone] Office: [place] 02/24/2018, 1:06 PM

## 2018-02-24 NOTE — Social Work (Signed)
CSW aware that pt will have PEG tube placed. CSW followed up with pt first SNF choice and will present offers when PEG has been placed.   CSW continuing to follow.  Alexander Mt, [place] Work [phone]

## 2018-02-24 NOTE — Procedures (Signed)
Met thyroid ca, dysphagia  S/p 20 fr Gtube insertion No comp Stable EBL 0 Full use tomorrow Report in pacs

## 2018-02-24 NOTE — Sedation Documentation (Signed)
Patient is resting comfortably. 

## 2018-02-25 LAB — GLUCOSE, CAPILLARY
GLUCOSE-CAPILLARY: 103 mg/dL — AB (ref 65–99)
GLUCOSE-CAPILLARY: 103 mg/dL — AB (ref 65–99)
GLUCOSE-CAPILLARY: 95 mg/dL (ref 65–99)
Glucose-Capillary: 100 mg/dL — ABNORMAL HIGH (ref 65–99)
Glucose-Capillary: 84 mg/dL (ref 65–99)
Glucose-Capillary: 119 mg/dL — ABNORMAL HIGH (ref 65–99)

## 2018-02-25 LAB — BASIC METABOLIC PANEL
Anion gap: 7 (ref 5–15)
BUN: 24 mg/dL — AB (ref 6–20)
CALCIUM: 8.5 mg/dL — AB (ref 8.9–10.3)
CO2: 25 mmol/L (ref 22–32)
Chloride: 102 mmol/L (ref 101–111)
Creatinine, Ser: 0.97 mg/dL (ref 0.61–1.24)
GFR calc Af Amer: >60 mL/min (ref >60)
GFR calc non Af Amer: >60 mL/min (ref >60)
GLUCOSE: 98 mg/dL (ref 65–99)
Potassium: 4.2 mmol/L (ref 3.5–5.1)
Sodium: 134 mmol/L — ABNORMAL LOW (ref 135–145)

## 2018-02-25 LAB — CBC
HEMATOCRIT: 38.3 % — AB (ref 39.0–52.0)
Hemoglobin: 11.7 g/dL — ABNORMAL LOW (ref 13.0–17.0)
MCH: 26 pg (ref 26.0–34.0)
MCHC: 30.5 g/dL (ref 30.0–36.0)
MCV: 85.1 fL (ref 78.0–100.0)
Platelets: 219 10*3/uL (ref 150–400)
RBC: 4.5 MIL/uL (ref 4.22–5.81)
RDW: 20.6 % — AB (ref 11.5–15.5)
WBC: 14.6 10*3/uL — ABNORMAL HIGH (ref 4.0–10.5)

## 2018-02-25 MED ORDER — ENOXAPARIN SODIUM 100 MG/ML ~~LOC~~ SOLN
1.0000 mg/kg | Freq: Two times a day (BID) | SUBCUTANEOUS | Status: DC
  Administered 2018-02-25 – 2018-03-02 (×10): 85 mg via SUBCUTANEOUS
  Filled 2018-02-25 (×10): qty 1

## 2018-02-25 NOTE — Progress Notes (Addendum)
Nutrition Follow-up  DOCUMENTATION CODES:  Severe malnutrition in context of acute illness/injury   INTERVENTION:  Continue Jevity 1.2 @ 69m/hr via PEG w/ 30 ml Prostat BID & Continue 200 ml free water flush QID  Pt's existing tube feeding regimen is still meeting 100% of estimated protein, kcal, and fluid needs.   Will Defer cycling/bolusing of TF to SNF according to his PO intake and rehab schedule. Note-Pt/family member today state they are undecided as to whether or not patient will go to SNF? Will require education on bolus admin if he is to return home. (lives alone)  NUTRITION DIAGNOSIS:  Severe Malnutrition related to acute illness(recent dysphagia) as evidenced by moderate muscle depletion, percent weight loss, energy intake < or equal to 50% for > or equal to 5 days(5.5% wt loss in one month).  Resolving w/ TF that has met 100% of needs.   GOAL:  Patient will meet greater than or equal to 90% of their needs   MET  MONITOR:  PO intake, Diet advancement, Labs, Weight trends, TF tolerance, Skin, I & O's  REASON FOR ASSESSMENT:  Consult Enteral/tube feeding initiation and management(S/P PEG)  ASSESSMENT:  82y.o. 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 S/p vocal cord injection in OR on 3/15. Admitted from ENT with concern for dehydration and dysphagia. Pt with difficulty swallowing.  3/20- NGT placed under fluoroscopy, TF initiated 3/24- s/p MBSS- advanced to dysphagia 1 diet with honey thick liquids 3/27- s/p Repeat MBS- diet advanced to D1/NTL 3/28 CT shows extensive recurrence of thyroid cancer adjacent to trachea as well as into anterior esophageal wall and probable lung mets.  3/29 PEG placed  Per intake records, patient has continued to have minimal PO intake (0-25%). He is currently NPO, but this is thought to have only been due to procedure, as last  MD note indicates BP meds will be switched to by mouth. Patient himself says he "would try" to eat if diet is advanced. Alternatively, he may be advised to remain NPO due to tumor invasion of esophageal wall. Irregardless, TF meeting 100% needs.  Patient continues to receive Jevity 1.2 @ 679mhr via NGT w/ 30 ml Prostat BID & Continue 200 ml free water flush QID. Patient denies any s/s of intolerance. Is still having regular BMs. No distension, nausea, vomiting, abd pain etc.   Tube feeding regimen provides 2072 kcal (100% of needs), 117 grams of protein, and 1259 ml of H2O + additional 800cc from flushes).  Patient has diuresed well and is now essentially at same weight that he presented at- 188 lbs  Pt's disposition is listed as SNF on MD note. In this circumstance, believe that keeping patient on continuous feeds is ideal until it is discovered what his rehab schedule is like, if he will take anything by mouth, and if so how much, etc.  However, pt and family member today state they are "still undecided" about where the patient will be going. They note patient lives on his own. Unsure if he would be able to administer his own tube feeds. If he is to go home, then BOLUS feeds would be needed as he wont have pump.   Last CSW note indicates that patient will indeed be going to SNF. Will keep feeds at continuous rate given notes indicating likely SNF disposition.   He will need teaching regarding bolus administration if he will be  going home.   Labs: BGs: 95-105-improved, WBC:14.6-improved Meds: Tube feedings, Fluid boluses, BP meds, PRN opioid analgesics.   Recent Labs  Lab 02/22/18 0425 02/23/18 0406 02/25/18 0707  NA 134* 134* 134*  K 5.1 3.8 4.2  CL 96* 96* 102  CO2 '30 28 25  ' BUN 48* 43* 24*  CREATININE 1.03 0.98 0.97  CALCIUM 8.7* 8.7* 8.5*  GLUCOSE 149* 130* 98   Diet Order:  Diet NPO time specified Except for: Ice Chips  EDUCATION NEEDS:  No education needs have been  identified at this time  Skin:  Abrasions to R leg, Wound to Left leg, New surgical incision to abdomen from PEG, MSAD to R/L buttocks, incision to Lip  Last BM:  3/29  Height:  Ht Readings from Last 1 Encounters:  02/14/18 '5\' 9"'  (1.753 m)   Weight:  Wt Readings from Last 1 Encounters:  02/25/18 188 lb 4.4 oz (85.4 kg)   Wt Readings from Last 10 Encounters:  02/25/18 188 lb 4.4 oz (85.4 kg)  02/10/18 194 lb (88 kg)  01/25/18 199 lb (90.3 kg)  01/11/18 200 lb (90.7 kg)  12/06/17 203 lb (92.1 kg)  12/06/17 203 lb (92.1 kg)  09/12/17 212 lb (96.2 kg)  08/09/17 209 lb (94.8 kg)  05/12/17 211 lb (95.7 kg)  04/27/17 210 lb (95.3 kg)   Ideal Body Weight:  72.7 kg  BMI:  Body mass index is 27.8 kg/m.  Estimated Nutritional Needs:  Kcal:  1950-2150 (23-25 kcal/kg bw) Protein:  102-120g Pro (1.2-1.4 g/kg bw) Fluid:  2-2.2 L fluid (1 ml/kcal)  Burtis Junes RD, LDN, CNSC Clinical Nutrition Available Tues-Sat via Pager: [phone] 02/25/2018 11:34 AM

## 2018-02-25 NOTE — Progress Notes (Signed)
ANTICOAGULATION CONSULT NOTE - Initial Consult  Pharmacy Consult for Lovenox Indication: atrial fibrillation  Allergies  Allergen Reactions  . Morphine And Related Nausea And Vomiting    Patient Measurements: Height: 5\' 9"  (175.3 cm) Weight: 188 lb 4.4 oz (85.4 kg) IBW/kg (Calculated) : 70.7  Vital Signs: Temp: 98.6 F (37 C) (03/30 1149) Temp Source: Oral (03/30 1149) BP: 149/60 (03/30 1149) Pulse Rate: 60 (03/30 1149)  Labs: Recent Labs    02/23/18 0406 02/23/18 2053 02/25/18 0707  HGB 12.0*  --  11.7*  HCT 38.9*  --  38.3*  PLT 254  --  219  LABPROT  --  13.1  --   INR  --  1.00  --   CREATININE 0.98  --  0.97    Estimated Creatinine Clearance: 65.8 mL/min (by C-G formula based on SCr of 0.97 mg/dL).   Medical History: Past Medical History:  Diagnosis Date  . Atrial flutter ([place])    on Eliquis  . AV malformation of GI tract   . COPD (chronic obstructive pulmonary disease) ([place]) 3.12.14   2D Echo, EF 50-55%  . Coronary atherosclerosis of native coronary artery    Multivessel status post CABG  . Gastric ulcer    Small - nonbleeding  . GERD (gastroesophageal reflux disease)   . Headache(784.0)   . Hypothyroidism   . Iron deficiency anemia    Negative Givens capsule study   . Myocardial infarction ([place])   . PAD (peripheral artery disease) ([facility])    Moderate bilateral SFA disease at angiography 01/2013  . Pituitary macroadenoma ([place])   . Thyroid cancer ([place])   . Vocal cord paralysis    left   Assessment: 82 yo M on Eliquis PTA for AFib. Has had dysphagia so transitioned to Lovenox. GT tube placed on 3/29 so Lovenox held. Now restarting on 3/30 evening. Hgb stable, plts wnl.  Goal of Therapy:  Monitor platelets by anticoagulation protocol: Yes   Plan:  Restart enoxparin 85mg  [place] Q12h today at 1800 Monitor CBC, s/s of bleed F/U transition back to [employer] J 02/25/2018,12:37 PM

## 2018-02-25 NOTE — Progress Notes (Signed)
Referring Physician(s): Domenic Polite  Supervising Physician: Daryll Brod  Patient Status:  [facility] - In-pt  Chief Complaint:  Dysphagia S/P Gastrostomy tube placement by Dr.Shick 02/24/2018  Subjective:  Mr Rajan was sleeping when I entered the room. He does awaken to my voice. He has no complaints.  Allergies: Morphine and related  Medications: Prior to Admission medications   Medication Sig Start Date End Date Taking? Authorizing Provider  albuterol (PROVENTIL HFA) 108 (90 BASE) MCG/ACT inhaler Inhale 2 puffs into the lungs every 6 (six) hours as needed for wheezing or shortness of breath. 11/15/14  Yes Satira Sark, MD  apixaban (ELIQUIS) 5 MG TABS tablet Take 5 mg by mouth 2 (two) times daily.  09/15/17  Yes Rehman, Mechele Dawley, MD  diltiazem (CARDIZEM CD) 240 MG 24 hr capsule Take 1 capsule (240 mg total) by mouth daily. 05/30/15  Yes Lendon Colonel, NP  folic acid (FOLVITE) 1 MG tablet Take 1 mg by mouth daily.    Yes [provider]  furosemide (LASIX) 20 MG tablet Take 1 tablet (20 mg total) by mouth daily. 01/11/18 04/11/18 Yes Imogene Burn, PA-C  levothyroxine (SYNTHROID, LEVOTHROID) 125 MCG tablet Take 125 mcg by mouth daily before breakfast.    Yes [provider]  lisinopril (PRINIVIL,ZESTRIL) 40 MG tablet Take 1 tablet (40 mg total) by mouth daily. 01/25/18 04/25/18 Yes Imogene Burn, PA-C  metoprolol succinate (TOPROL-XL) 50 MG 24 hr tablet Take 50 mg by mouth 2 (two) times daily. Take with or immediately following a meal.   Yes [provider]  mirtazapine (REMERON) 15 MG tablet Take 15 mg by mouth at bedtime.   Yes [provider]  nitroGLYCERIN (NITROSTAT) 0.4 MG SL tablet Place 1 tablet (0.4 mg total) under the tongue every 5 (five) minutes as needed for chest pain. 06/07/14  Yes Satira Sark, MD  omeprazole (PRILOSEC) 20 MG capsule Take 20 mg by mouth 2 (two) times daily before a meal.   Yes [provider]  potassium chloride SA (K-DUR,KLOR-CON) 20 MEQ tablet Take 1 tablet (20 mEq total) by mouth daily. 01/11/18  Yes Imogene Burn, PA-C  simvastatin (ZOCOR) 40 MG tablet Take 0.5 tablets (20 mg total) by mouth at bedtime. 07/25/16  Yes Sinda Du, MD     Vital Signs: BP (!) 169/60 (BP Location: Right Arm)   Pulse (!) 54   Temp 98.3 F (36.8 C) (Oral)   Resp 20   Ht 5\' 9"  (1.753 m)   Wt 188 lb 4.4 oz (85.4 kg)   SpO2 98%   BMI 27.80 kg/m   Physical Exam Resting comfortable NAD Gtube in place, looks good, no bleeding.  Imaging: Ct Abdomen Wo Contrast  Result Date: 02/23/2018 CLINICAL DATA:  Dysphagia, aspiration risk, assess anatomy for gastrostomy insertion EXAM: CT ABDOMEN WITHOUT CONTRAST TECHNIQUE: Multidetector CT imaging of the abdomen was performed following the standard protocol without IV contrast. COMPARISON:  02/22/2018 modified swallow FINDINGS: Lower chest: Several bilateral lower chest pulmonary nodules all measuring 5 mm or less in size within the lower lobes, inferior right middle lobe, and lingula Right basilar atelectasis versus scarring also noted. Normal heart size. Native coronary atherosclerosis. Previous median sternotomy evident. Feeding tube extends into the stomach distally. Hepatobiliary: Cholelithiasis evident. No focal hepatic abnormality. No biliary dilatation or obstruction. Pancreas: No focal abnormality or ductal dilatation. No surrounding inflammatory process. Spleen: Normal in size. Adrenals/Urinary Tract: Normal adrenal glands. Kidneys demonstrate no acute obstructive  uropathy or hydronephrosis. Ureters are symmetric and decompressed. Mild chronic perinephric strandy edema. Indeterminate posterior left renal hyperdense cortical lesion measures 10 mm this may represent a proteinaceous or hemorrhagic cyst. Stomach/Bowel: Feeding tube enters the stomach. No significant hiatal hernia. Stomach anatomy appears favorable for fluoroscopic  gastrostomy technique. Stomach is inferior to the left hepatic lobe and the midportion of the stomach is superior to the transverse colon. Feeding tube extends into the duodenal bulb. Stomach is currently decompressed. Vascular/Lymphatic: Aortic atherosclerosis noted. Negative for aneurysm. No retroperitoneal hemorrhage or hematoma. No abdominal adenopathy. Other: No abdominal wall hernia. No free fluid or ascites. Negative for abscess. Musculoskeletal: No acute osseous finding. Anterior L2 vertebral body lytic lesion without compression pathologic fracture. Appearance is suspicious for metastatic disease or myeloma. IMPRESSION: Stomach anatomy appears favorable for attempt at fluoroscopic gastrostomy insertion. Numerous subcentimeter bilateral pulmonary nodules suspicious for pulmonary metastases. Lytic L2 vertebral body suspicious for osseous metastasis. Atherosclerosis without aneurysm Cholelithiasis Electronically Signed   By: Jerilynn Mages.  Shick M.D.   On: 02/23/2018 16:06   Ct Soft Tissue Neck W Contrast  Result Date: 02/22/2018 CLINICAL DATA:  Improving dysphagia. Status post laryngoscopy. History of thyroid cancer, thyroidectomy, laryngoplasty, pituitary macroadenoma. EXAM: CT NECK WITH CONTRAST TECHNIQUE: Multidetector CT imaging of the neck was performed using the standard protocol following the bolus administration of intravenous contrast. CONTRAST:  56mL ISOVUE-300 IOPAMIDOL (ISOVUE-300) INJECTION 61% COMPARISON:  CT neck August 25, 2016 FINDINGS: PHARYNX AND LARYNX: Focally sclerotic LEFT thyroid and cricoid cartilages consistent with tumoral involvement. Mild medial deviation RIGHT true vocal cord could reflect paralysis. Normal pharynx. SALIVARY GLANDS: Normal. THYROID: Status post thyroidectomy. 21 x 14 mm abnormal soft tissue LEFT posterior thyroid bed and appears to invade the anterior status soft [place]. Additional subcentimeter nodules inferior LEFT thyroid bed. LYMPH NODES: 10 mm round LEFT level  4 lymph node. 9 mm LEFT level 7 lymph node with additional smaller bilateral 7 lymph nodes. VASCULAR: Moderate calcific atherosclerosis aortic arch. Moderate calcific atherosclerosis carotid bifurcations. LIMITED INTRACRANIAL: Normal. VISUALIZED ORBITS: Status post bilateral ocular lens implants. MASTOIDS AND VISUALIZED PARANASAL SINUSES: LEFT nasogastric tube. SKELETON: Nonacute.  Patient is edentulous. UPPER CHEST: Centrilobular emphysema. Multiple LEFT pulmonary nodules measuring to at least 11 mm, some of which are new from prior CT. Moderate centrilobular emphysema. Status post median sternotomy. OTHER: None. IMPRESSION: 1. Status post thyroidectomy with 21 x 14 mm mass LEFT thyroid bed consistent with invasive residual versus recurrent tumor (including suspected esophageal invasion). 2. New pathologic level for lymphadenopathy with probable additional smaller alignment lymph nodes LEFT level 7. 3. Multiple pulmonary nodules, some which are new from prior CT most compatible with progressive metastasis. Aortic Atherosclerosis (ICD10-I70.0) and Emphysema (ICD10-J43.9). Electronically Signed   By: Elon Alas M.D.   On: 02/22/2018 21:14   Ir Gastrostomy Tube Mod Sed  Result Date: 02/24/2018 INDICATION: Thyroid cancer, dysphagia, malnutrition EXAM: FLUOROSCOPIC 20 FRENCH PULL-THROUGH GASTROSTOMY Date:  02/24/2018 02/24/2018 1:43 pm Radiologist:  Jerilynn Mages. Daryll Brod, MD Guidance:  Fluoroscopic MEDICATIONS: 3.375 g Zosyn; Antibiotics were administered within 1 hour of the procedure. Glucagon 0.5 mg IV ANESTHESIA/SEDATION: Versed 1.5 mg IV; Fentanyl 75 mcg IV Moderate Sedation Time:  11 minutes The patient was continuously monitored during the procedure by the interventional radiology nurse under my direct supervision. CONTRAST:  10 cc Isovue-administered into the gastric lumen. FLUOROSCOPY TIME:  Fluoroscopy Time: 1 minutes 18 seconds (12 mGy). COMPLICATIONS: None immediate. PROCEDURE: Informed consent was  obtained from the patient following explanation of the procedure, risks,  benefits and alternatives. The patient understands, agrees and consents for the procedure. All questions were addressed. A time out was performed. Maximal barrier sterile technique utilized including caps, mask, sterile gowns, sterile gloves, large sterile drape, hand hygiene, and betadine prep. The left upper quadrant was sterilely prepped and draped. An oral gastric catheter was inserted into the stomach under fluoroscopy. The existing nasogastric feeding tube was removed. Air was injected into the stomach for insufflation and visualization under fluoroscopy. The air distended stomach was confirmed beneath the anterior abdominal wall in the frontal and lateral projections. Under sterile conditions and local anesthesia, a 18 gauge trocar needle was utilized to access the stomach percutaneously beneath the left subcostal margin. Needle position was confirmed within the stomach under biplane fluoroscopy. Contrast injection confirmed position also. A single T tack was deployed for gastropexy. Over an Amplatz guide wire, a 9-French sheath was inserted into the stomach. A snare device was utilized to capture the oral gastric catheter. The snare device was pulled retrograde from the stomach up the esophagus and out the oropharynx. The 20-French pull-through gastrostomy was connected to the snare device and pulled antegrade through the oropharynx down the esophagus into the stomach and then through the percutaneous tract external to the patient. The gastrostomy was assembled externally. Contrast injection confirms position in the stomach. Images were obtained for documentation. The patient tolerated procedure well. No immediate complication. IMPRESSION: Fluoroscopic insertion of a 20-French "pull-through" gastrostomy. Electronically Signed   By: Jerilynn Mages.  Shick M.D.   On: 02/24/2018 13:45   Dg Chest Port 1 View  Result Date: 02/22/2018 CLINICAL DATA:   Dyspnea. History of COPD, previous MI, atrial flutter, chronic CHF. Former smoker. EXAM: PORTABLE CHEST 1 VIEW COMPARISON:  PA and lateral chest x-ray of February 18, 2018. FINDINGS: The lungs are adequately inflated and clear. The heart and pulmonary vascularity are normal. The mediastinum is normal in width. There are post CABG changes. There is calcification in the wall of the aortic arch. IMPRESSION: Previous CABG. No pneumonia, CHF, nor other acute cardiopulmonary abnormality. Electronically Signed   By: David  [place]ique M.D.   On: 02/22/2018 14:22    Labs:  CBC: Recent Labs    02/21/18 1037 02/22/18 0425 02/23/18 0406 02/25/18 0707  WBC 20.4* 22.8* 24.6* 14.6*  HGB 12.0* 11.8* 12.0* 11.7*  HCT 39.1 39.4 38.9* 38.3*  PLT 274 263 254 219    COAGS: Recent Labs    02/23/18 2053  INR 1.00    BMP: Recent Labs    02/20/18 0512 02/22/18 0425 02/23/18 0406 02/25/18 0707  NA 134* 134* 134* 134*  K 5.0 5.1 3.8 4.2  CL 99* 96* 96* 102  CO2 25 30 28 25   GLUCOSE 142* 149* 130* 98  BUN 49* 48* 43* 24*  CALCIUM 8.7* 8.7* 8.7* 8.5*  CREATININE 0.92 1.03 0.98 0.97  GFRNONAA >60 >60 >60 >60  GFRAA >60 >60 >60 >60    LIVER FUNCTION TESTS: Recent Labs    03/31/17 0544 04/27/17 1755 02/14/18 1601  BILITOT 0.6 0.5 1.3*  AST 17 23 30   ALT 15* 18 22  ALKPHOS 56 71 62  PROT 6.3* 6.8 6.7  ALBUMIN 3.7 4.0 3.6    Assessment and Plan:  Dysphagia  S/P G-tube by Dr. Annamaria Boots 02/24/18  Can begin tube feeds.  Routine G-tube care.  Electronically Signed: Murrell Redden, PA-C 02/25/2018, 11:09 AM   I spent a total of 15 Minutes at the the patient's bedside AND on the patient's hospital  floor or unit, greater than 50% of which was counseling/coordinating care for f/u G-tube.

## 2018-02-25 NOTE — Progress Notes (Signed)
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 He presented to [facility] increasing SOB and hoarseness found to have VOcal cord paralysis. He was taken to the OR on 3/15 by Dr. Lucia Gaskins and underwent prolaryn injection to vocal cords, then presented 3/19  to Dr. Pollie Friar office and there was concern for dehydration and dysphagia so he was sent to [facility] for direct admission. Since the procedure, he has been able to have any solid or liquid food . Will Modified Barium swallow 3/20 with severe cervical esophageal swelling with resultant dysphagia with obstructing tissue at C5/6 and C7. Pt is unable to pass1/4 teaspoon of thin liquids despite maximal effortful swallow  -ENT consulted, 3/20 started steroids and Abx, Cortrak placed for tube feeds -no significant improvement in dysphagia yet -CT neck 3/27 fairly extensive recurrence of his thyroid cancer adjacent to the trachea as well as into the anterior wall of his esophagus and probable lung mets -PEG placement 3/29  Assessment & Plan: Principal Problem:   Dysphagia Active Problems:   Essential hypertension, benign   Mixed hyperlipidemia   PAF (paroxysmal atrial fibrillation) (HCC)   Chronic diastolic congestive heart failure (HCC)   Hypothyroidism (acquired)   Abrasion of lower leg, left, initial encounter   Protein-calorie malnutrition, severe   Severe Cervical esophageal swelling with dysphagia  -Status post prolaryn injection to vocal cords 3/15 for vocal cord paralysis, since then had profound dysphagia, MBS 3/20 which noted Severe cervical esophageal swelling with resultant dysphagia with obstructing tissue at C5/6 and C7. -ENT, Dr.Newman  following, treated with 1 week of  ABx and IV decadron, no significant improvement in his dysphagia and continues to have significant cervical esophageal swelling -CT neck 3/27 completed showed extensive recurrence of his thyroid cancer adjacent to the trachea and involving esophagus and probable lung metastases, stopped Decadron -We agreed to place PEG tube after discussion with patient and son, and Dr. Lucia Gaskins will discuss with radiation oncology regarding options for his aggressive papillary thyroid cancer which appears to be metastatic at this time -IR consulted for PEG tube, remove cortrak when PEG placed in -continue D1 diet with thickened liquids simultaneously -Appreciate Dr. Lucia Gaskins input -Status post PEG tube placed 3/29, ready to use today, consulted nutrition for initiation of tube feedings  Aggressive papillary thyroid CA -history of left papillary thyroid carcinoma with paralysis of vocal cord -history of thyroidectomy and laryngoplasty about 2 years ago -appears to have Lung mets based this scan -ENT Dr.NEwman to d/w Radonc and Endocrine regarding options to Rx this  Leukocytosis -most likely secondary to underlying aggressive malignancy, clinically no evidence of active infection at this point, improving today, white count 14 -Stop IV Zosyn 3/30 after this evening dose, will have completed a ten-day total course of antibiotics -steroids were suspected to be contributing as well, I stopped his Decadron  P.Afib on Xarelto -in NSR, and HR controlled -hold Xarelto and Lovenox for PEG tube placement, resume anticoagulation as soon as it is okay with interventional radiology -PO metoprolol and Diltiazem on hold, will resume once PEG tube is seen and can be used -continue IV lopressor  -Rate controlled with heart rate in the 60s  H/o Hypothyroidism -currently hyperthyroid by labs, oversupressed -recheck levels and resume synthroid  at very small dose in 4-6weeks  HTN -unable  to tolerate PO, IV metoprolol and when necessary hydralazine for now -Blood pressure 152/66 this morning, resume p.o. meds as soon as he is able to tolerate feeding and his PEG tube  HLD -Hold Zocor   Acute on Chronic diastolic heart failure -diuresed with IV lasix 3/25, and 3/26 -monitor Volume status closely -clinically appears euvolemic now, he weighs 188 pounds this morning, closely monitor  R leg abrasion/skin tear -no s/s of infection, [facility] consult appreciated    DVT prophylaxis: Lovenox Code Status: DNR Family Communication: no family at bedside Disposition Plan: SNF 1-2 days  Consultants:   ENT  IR  Procedures:   MBS x 2  Antimicrobials:   none   Subjective: -He feels well this morning, tolerated feeding tube placement well, no abdominal pain this morning, no nausea or vomiting  Objective: Vitals:   02/24/18 2342 02/25/18 0601 02/25/18 0655 02/25/18 0908  BP: (!) 136/58 (!) 152/66  (!) 169/60  Pulse: (!) 55 (!) 54  (!) 54  Resp: (!) 98 20    Temp: 98.8 F (37.1 C) 97.9 F (36.6 C)  98.3 F (36.8 C)  TempSrc: Oral Oral  Oral  SpO2: 98% 98%  98%  Weight:   85.4 kg (188 lb 4.4 oz)   Height:        Intake/Output Summary (Last 24 hours) at 02/25/2018 1044 Last data filed at 02/25/2018 1000 Gross per 24 hour  Intake 700 ml  Output 1050 ml  Net -350 ml   Filed Weights   02/23/18 0611 02/24/18 0459 02/25/18 0655  Weight: 86.3 kg (190 lb 3.2 oz) 87 kg (191 lb 12.8 oz) 85.4 kg (188 lb 4.4 oz)    Examination:  Constitutional: No distress Eyes: No scleral icterus, lids and conjunctivae normal ENMT: Moist mucous membranes Respiratory: Clear to auscultation without wheezing or crackles, normal respiratory effort without accessory muscle use Cardiovascular: Regular rate and rhythm without murmurs.  Trace lower extremity edema. Abdomen: Soft, nontender, nondistended, positive bowel sounds.  PEG tube in place Skin: No rashes seen Neurologic: Nonfocal,  equal strength   Data Reviewed: I have independently reviewed following labs and imaging studies   CBC: Recent Labs  Lab 02/20/18 0512 02/21/18 1037 02/22/18 0425 02/23/18 0406 02/25/18 0707  WBC 21.8* 20.4* 22.8* 24.6* 14.6*  HGB 12.6* 12.0* 11.8* 12.0* 11.7*  HCT 40.6 39.1 39.4 38.9* 38.3*  MCV 84.6 85.6 84.7 83.3 85.1  PLT 325 274 263 254 767   Basic Metabolic Panel: Recent Labs  Lab 02/19/18 0601 02/20/18 0512 02/22/18 0425 02/23/18 0406 02/25/18 0707  NA 137 134* 134* 134* 134*  K 4.5 5.0 5.1 3.8 4.2  CL 101 99* 96* 96* 102  CO2 27 25 30 28 25   GLUCOSE 136* 142* 149* 130* 98  BUN 37* 49* 48* 43* 24*  CREATININE 0.84 0.92 1.03 0.98 0.97  CALCIUM 9.2 8.7* 8.7* 8.7* 8.5*   GFR: Estimated Creatinine Clearance: 65.8 mL/min (by C-G formula based on SCr of 0.97 mg/dL). Liver Function Tests: No results for input(s): AST, ALT, ALKPHOS, BILITOT, PROT, ALBUMIN in the last 168 hours. No results for input(s): LIPASE, AMYLASE in the last 168 hours. No results for input(s): AMMONIA in the last 168 hours. Coagulation Profile: Recent Labs  Lab 02/23/18 2053  INR 1.00   Cardiac Enzymes: No results for input(s): CKTOTAL, CKMB, CKMBINDEX, TROPONINI in the last 168 hours. BNP (last 3 results) No results for input(s): PROBNP in the  last 8760 hours. HbA1C: No results for input(s): HGBA1C in the last 72 hours. CBG: Recent Labs  Lab 02/24/18 1558 02/24/18 2018 02/24/18 2358 02/25/18 0331 02/25/18 0815  GLUCAP 83 110* 103* 95 103*   Lipid Profile: No results for input(s): CHOL, HDL, LDLCALC, TRIG, CHOLHDL, LDLDIRECT in the last 72 hours. Thyroid Function Tests: No results for input(s): TSH, T4TOTAL, FREET4, T3FREE, THYROIDAB in the last 72 hours. Anemia Panel: No results for input(s): VITAMINB12, FOLATE, FERRITIN, TIBC, IRON, RETICCTPCT in the last 72 hours. Urine analysis:    Component Value Date/Time   COLORURINE YELLOW 12/06/2017 1215   APPEARANCEUR HAZY (A)  12/06/2017 1215   LABSPEC 1.021 12/06/2017 1215   PHURINE 5.0 12/06/2017 1215   GLUCOSEU NEGATIVE 12/06/2017 1215   HGBUR LARGE (A) 12/06/2017 1215   BILIRUBINUR NEGATIVE 12/06/2017 1215   KETONESUR NEGATIVE 12/06/2017 1215   PROTEINUR 30 (A) 12/06/2017 1215   NITRITE NEGATIVE 12/06/2017 1215   LEUKOCYTESUR TRACE (A) 12/06/2017 1215   Sepsis Labs: Invalid input(s): PROCALCITONIN, LACTICIDVEN  No results found for this or any previous visit (from the past 240 hour(s)).    Radiology Studies: Ct Abdomen Wo Contrast  Result Date: 02/23/2018 CLINICAL DATA:  Dysphagia, aspiration risk, assess anatomy for gastrostomy insertion EXAM: CT ABDOMEN WITHOUT CONTRAST TECHNIQUE: Multidetector CT imaging of the abdomen was performed following the standard protocol without IV contrast. COMPARISON:  02/22/2018 modified swallow FINDINGS: Lower chest: Several bilateral lower chest pulmonary nodules all measuring 5 mm or less in size within the lower lobes, inferior right middle lobe, and lingula Right basilar atelectasis versus scarring also noted. Normal heart size. Native coronary atherosclerosis. Previous median sternotomy evident. Feeding tube extends into the stomach distally. Hepatobiliary: Cholelithiasis evident. No focal hepatic abnormality. No biliary dilatation or obstruction. Pancreas: No focal abnormality or ductal dilatation. No surrounding inflammatory process. Spleen: Normal in size. Adrenals/Urinary Tract: Normal adrenal glands. Kidneys demonstrate no acute obstructive uropathy or hydronephrosis. Ureters are symmetric and decompressed. Mild chronic perinephric strandy edema. Indeterminate posterior left renal hyperdense cortical lesion measures 10 mm this may represent a proteinaceous or hemorrhagic cyst. Stomach/Bowel: Feeding tube enters the stomach. No significant hiatal hernia. Stomach anatomy appears favorable for fluoroscopic gastrostomy technique. Stomach is inferior to the left hepatic lobe  and the midportion of the stomach is superior to the transverse colon. Feeding tube extends into the duodenal bulb. Stomach is currently decompressed. Vascular/Lymphatic: Aortic atherosclerosis noted. Negative for aneurysm. No retroperitoneal hemorrhage or hematoma. No abdominal adenopathy. Other: No abdominal wall hernia. No free fluid or ascites. Negative for abscess. Musculoskeletal: No acute osseous finding. Anterior L2 vertebral body lytic lesion without compression pathologic fracture. Appearance is suspicious for metastatic disease or myeloma. IMPRESSION: Stomach anatomy appears favorable for attempt at fluoroscopic gastrostomy insertion. Numerous subcentimeter bilateral pulmonary nodules suspicious for pulmonary metastases. Lytic L2 vertebral body suspicious for osseous metastasis. Atherosclerosis without aneurysm Cholelithiasis Electronically Signed   By: Jerilynn Mages.  Shick M.D.   On: 02/23/2018 16:06   Ir Gastrostomy Tube Mod Sed  Result Date: 02/24/2018 INDICATION: Thyroid cancer, dysphagia, malnutrition EXAM: FLUOROSCOPIC 20 FRENCH PULL-THROUGH GASTROSTOMY Date:  02/24/2018 02/24/2018 1:43 pm Radiologist:  Jerilynn Mages. Daryll Brod, MD Guidance:  Fluoroscopic MEDICATIONS: 3.375 g Zosyn; Antibiotics were administered within 1 hour of the procedure. Glucagon 0.5 mg IV ANESTHESIA/SEDATION: Versed 1.5 mg IV; Fentanyl 75 mcg IV Moderate Sedation Time:  11 minutes The patient was continuously monitored during the procedure by the interventional radiology nurse under my direct supervision. CONTRAST:  10 cc Isovue-administered into the  gastric lumen. FLUOROSCOPY TIME:  Fluoroscopy Time: 1 minutes 18 seconds (12 mGy). COMPLICATIONS: None immediate. PROCEDURE: Informed consent was obtained from the patient following explanation of the procedure, risks, benefits and alternatives. The patient understands, agrees and consents for the procedure. All questions were addressed. A time out was performed. Maximal barrier sterile technique  utilized including caps, mask, sterile gowns, sterile gloves, large sterile drape, hand hygiene, and betadine prep. The left upper quadrant was sterilely prepped and draped. An oral gastric catheter was inserted into the stomach under fluoroscopy. The existing nasogastric feeding tube was removed. Air was injected into the stomach for insufflation and visualization under fluoroscopy. The air distended stomach was confirmed beneath the anterior abdominal wall in the frontal and lateral projections. Under sterile conditions and local anesthesia, a 42 gauge trocar needle was utilized to access the stomach percutaneously beneath the left subcostal margin. Needle position was confirmed within the stomach under biplane fluoroscopy. Contrast injection confirmed position also. A single T tack was deployed for gastropexy. Over an Amplatz guide wire, a 9-French sheath was inserted into the stomach. A snare device was utilized to capture the oral gastric catheter. The snare device was pulled retrograde from the stomach up the esophagus and out the oropharynx. The 20-French pull-through gastrostomy was connected to the snare device and pulled antegrade through the oropharynx down the esophagus into the stomach and then through the percutaneous tract external to the patient. The gastrostomy was assembled externally. Contrast injection confirms position in the stomach. Images were obtained for documentation. The patient tolerated procedure well. No immediate complication. IMPRESSION: Fluoroscopic insertion of a 20-French "pull-through" gastrostomy. Electronically Signed   By: Jerilynn Mages.  Shick M.D.   On: 02/24/2018 13:45     Scheduled Meds: . feeding supplement (PRO-STAT SUGAR FREE 64)  30 mL Per Tube BID  . free water  200 mL Per Tube QID  . mouth rinse  15 mL Mouth Rinse BID  . metoprolol tartrate  5 mg Intravenous Q6H  .  morphine injection  1 mg Intravenous Once  . mupirocin ointment   Topical Daily   Continuous  Infusions: . feeding supplement (JEVITY 1.2 CAL) Stopped (02/24/18 0000)  . piperacillin-tazobactam (ZOSYN)  IV Stopped (02/25/18 0919)     Marzetta Board, MD, PhD [facility] Pager [phone]  If 7PM-7AM, please contact night-coverage [email or web address] Password [facility] 02/25/2018, 10:44 AM

## 2018-02-26 LAB — GLUCOSE, CAPILLARY
GLUCOSE-CAPILLARY: 120 mg/dL — AB (ref 65–99)
GLUCOSE-CAPILLARY: 119 mg/dL — AB (ref 65–99)
Glucose-Capillary: 122 mg/dL — ABNORMAL HIGH (ref 65–99)
Glucose-Capillary: 106 mg/dL — ABNORMAL HIGH (ref 65–99)
Glucose-Capillary: 149 mg/dL — ABNORMAL HIGH (ref 65–99)
Glucose-Capillary: 110 mg/dL — ABNORMAL HIGH (ref 65–99)

## 2018-02-26 MED ORDER — HYDRALAZINE HCL 20 MG/ML IJ SOLN: 10.0000 mg | INTRAMUSCULAR | Status: DC | PRN

## 2018-02-26 MED ORDER — METOPROLOL TARTRATE 50 MG PO TABS
50.0000 mg | ORAL_TABLET | Freq: Two times a day (BID) | ORAL | Status: DC
  Administered 2018-02-27 – 2018-03-01 (×6): 50 mg via ORAL
  Filled 2018-02-26 (×7): qty 1

## 2018-02-26 MED ORDER — LEVOTHYROXINE SODIUM 75 MCG PO TABS
75.0000 ug | ORAL_TABLET | Freq: Every day | ORAL | Status: DC
  Administered 2018-02-27 – 2018-03-02 (×4): 75 ug via ORAL
  Filled 2018-02-26 (×4): qty 1

## 2018-02-26 NOTE — Progress Notes (Addendum)
Patient Demographics:    John Sampson, is a 82 y.o. male, DOB - 1936-07-10, WGN:[id number]  Admit date - 02/14/2018   Admitting Physician Geradine Girt, DO  Outpatient Primary MD for the patient is Sinda Du, MD  LOS - 11   No chief complaint on file.       Subjective:    John Sampson today has no fevers, no emesis,  No chest pain, patient would like to eat  Assessment  & Plan :    Principal Problem:   Dysphagia Active Problems:   Essential hypertension, benign   Mixed hyperlipidemia   PAF (paroxysmal atrial fibrillation) (HCC)   Chronic diastolic congestive heart failure (HCC)   Hypothyroidism (acquired)   Abrasion of lower leg, left, initial encounter   Protein-calorie malnutrition, severe  Principal Problem:   Dysphagia Active Problems:   Essential hypertension, benign   Mixed hyperlipidemia   PAF (paroxysmal atrial fibrillation) (HCC)   Chronic diastolic congestive heart failure (HCC)   Hypothyroidism (acquired)   Abrasion of lower leg, left, initial encounter   Protein-calorie malnutrition, severe   Brief Narrative / Interim history: John Penna Cookeis a 82 y.o.malewith medical history significant ofleft 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 He presented to [facility] increasing SOB and hoarseness found to have VOcal cord paralysis. He was taken to the OR on 3/15 by Dr. Lucia Gaskins and underwent prolaryn injection to vocal cords, then presented 3/19 to Dr. Pollie Friar office and there was concern for dehydration and dysphagia so he was sent to [facility] for direct admission. Since the procedure, he has been able to have any solid or liquid food. Will Modified Barium swallow 3/20 with severe cervical esophageal swelling with resultant dysphagia with obstructing tissue at C5/6 and C7. Pt is unable to pass1/4 teaspoon  of thin liquids despite maximal effortful swallow, -ENT consulted,3/20 started steroids and Abx, Cortrak placed for tube feeds -no significant improvement in dysphagia yet, -CT neck 3/31fairly extensive recurrence of his thyroid cancer adjacent to the trachea as well as into the anterior wall of his esophagusand probable lung mets -PEG placement 3/29     Assessment & Plan:   1)Severe Cervical esophageal swelling with dysphagia - -Status post prolaryn injection to vocal cords 3/15 for vocal cord paralysis,now has profound dysphagia, MBS 02/15/18 which noted Severe cervical esophageal swelling with resultant dysphagia with obstructing tissue at C5/6 and C7. -ENT, Dr.Newman following,treated with 1 week of ABx and IV decadron, no significant improvement in his dysphagia and continues to have significant cervical esophageal swelling , -CT neck 3/27 completed showed extensive recurrence of his thyroid cancer adjacent to the trachea and involving esophagus and probable lung metastases, stopped Decadron, - s/p PEG placement on 02/24/18, Speech pathologist recommends dysphagia 1 diet (pured) and nectar thickened liquids in addition to PEG tube feedings  2)Aggressive papillary thyroid CA- -history of left papillary thyroid carcinoma with paralysis of vocal cord -history of thyroidectomy and laryngoplasty about 2 years ago, -appears to have Lung mets based this scan -ENT and  Dr.NEwman to d/w Radonc and Endocrine regarding options to Rx this  3)Leukocytosis-- down to 14,000, suspect due to combination of recent Decadron use and underlying malignancy, patient completed IV  Zosyn on 02/25/2018, he had 10 days total of antibiotics, no evidence of ongoing active infection at this   4)P.Afib - -in NSR, and HR controlled, continue therapeutic Lovenox ( patient was on Eliquis at home), upon discharge consider discharging back on Eliquis,  continue metoprolol 50 twice daily, may restart Cardizem CD 240 in  1-2 days if rate is not controlled,   5)H/o Hypothyroidism-TSH is down to 0.024, free T4 is 1.2, prior to admission patient was on levothyroxine 125 mcg daily, which has been on hold now may restart levothyroxine at 75 mcg daily, recheck TSH in about 4 weeks  6)HFpEF-patient has history of chronic diastolic dysfunction CHF, last known EF of 55%, initially there was concern about volume overload and patient received IV Lasix, appears to be euvolemic at this time,  7)R leg abrasion/skin tear -no s/s of infection, [facility] consult appreciated    DVT prophylaxis: Lovenox Code Status: DNR Family Communication: no family at bedside Disposition Plan: SNF    Consultants:   ENT  IR  Procedures:   MBS x 2   Lab Results  Component Value Date   PLT 219 02/25/2018    Inpatient Medications  Scheduled Meds: . enoxaparin (LOVENOX) injection  1 mg/kg Subcutaneous Q12H  . feeding supplement (PRO-STAT SUGAR FREE 64)  30 mL Per Tube BID  . free water  200 mL Per Tube QID  . mouth rinse  15 mL Mouth Rinse BID  . metoprolol tartrate  5 mg Intravenous Q6H  .  morphine injection  1 mg Intravenous Once  . mupirocin ointment   Topical Daily   Continuous Infusions: . feeding supplement (JEVITY 1.2 CAL) 65 mL/hr at 02/26/18 1400   PRN Meds:.acetaminophen **OR** acetaminophen, albuterol, hydrALAZINE, HYDROmorphone (DILAUDID) injection, ondansetron **OR** ondansetron (ZOFRAN) IV, RESOURCE THICKENUP CLEAR, sodium chloride flush, white petrolatum    Anti-infectives (From admission, onward)   Start     Dose/Rate Route Frequency Ordered Stop   02/22/18 1845  piperacillin-tazobactam (ZOSYN) IVPB 3.375 g     3.375 g 12.5 mL/hr over 240 Minutes Intravenous Every 8 hours 02/22/18 1835 02/25/18 2152   02/20/18 1400  piperacillin-tazobactam (ZOSYN) IVPB 3.375 g  Status:  Discontinued     3.375 g 12.5 mL/hr over 240 Minutes Intravenous Every 8 hours 02/20/18 0903 02/22/18 1835   02/20/18 0915   piperacillin-tazobactam (ZOSYN) IVPB 3.375 g  Status:  Discontinued     3.375 g 12.5 mL/hr over 240 Minutes Intravenous Every 8 hours 02/20/18 0902 02/20/18 0903   02/15/18 1300  ampicillin-sulbactam (UNASYN) 1.5 g in sodium chloride 0.9 % 100 mL IVPB  Status:  Discontinued     1.5 g 200 mL/hr over 30 Minutes Intravenous Every 6 hours 02/15/18 1157 02/20/18 0855        Objective:   Vitals:   02/26/18 0537 02/26/18 0910 02/26/18 1230 02/26/18 1400  BP: (!) 145/58 (!) 158/53 (!) 136/44 (!) 132/50  Pulse: (!) 59 65 60 62  Resp: 16 18  16   Temp: 98.3 F (36.8 C) 97.8 F (36.6 C)  97.7 F (36.5 C)  TempSrc: Oral Oral  Oral  SpO2: 95% 96%  97%  Weight:      Height:        Wt Readings from Last 3 Encounters:  02/25/18 85.4 kg (188 lb 4.4 oz)  02/10/18 88 kg (194 lb)  01/25/18 90.3 kg (199 lb)     Intake/Output Summary (Last 24 hours) at 02/26/2018 1739 Last data filed at 02/26/2018 1400 Gross  per 24 hour  Intake 2080 ml  Output 1050 ml  Net 1030 ml     Physical Exam  Gen:- Awake Alert,  In no apparent distress  HEENT:- [place].AT, No sclera icterus Neck-Supple Neck,No JVD,.  Lungs-  CTAB , good air entry CV- S1, S2 normal Abd-  +ve B.Sounds, Abd Soft, No tenderness, PEG tube site is clean dry and intact Extremity/Skin:- No  edema,   good pulses Psych-affect is appropriate, oriented x3 Neuro-no new focal deficits, no tremors   Data Review:   Micro Results No results found for this or any previous visit (from the past 240 hour(s)).  Radiology Reports Ct Abdomen Wo Contrast  Result Date: 02/23/2018 CLINICAL DATA:  Dysphagia, aspiration risk, assess anatomy for gastrostomy insertion EXAM: CT ABDOMEN WITHOUT CONTRAST TECHNIQUE: Multidetector CT imaging of the abdomen was performed following the standard protocol without IV contrast. COMPARISON:  02/22/2018 modified swallow FINDINGS: Lower chest: Several bilateral lower chest pulmonary nodules all measuring 5 mm or less in  size within the lower lobes, inferior right middle lobe, and lingula Right basilar atelectasis versus scarring also noted. Normal heart size. Native coronary atherosclerosis. Previous median sternotomy evident. Feeding tube extends into the stomach distally. Hepatobiliary: Cholelithiasis evident. No focal hepatic abnormality. No biliary dilatation or obstruction. Pancreas: No focal abnormality or ductal dilatation. No surrounding inflammatory process. Spleen: Normal in size. Adrenals/Urinary Tract: Normal adrenal glands. Kidneys demonstrate no acute obstructive uropathy or hydronephrosis. Ureters are symmetric and decompressed. Mild chronic perinephric strandy edema. Indeterminate posterior left renal hyperdense cortical lesion measures 10 mm this may represent a proteinaceous or hemorrhagic cyst. Stomach/Bowel: Feeding tube enters the stomach. No significant hiatal hernia. Stomach anatomy appears favorable for fluoroscopic gastrostomy technique. Stomach is inferior to the left hepatic lobe and the midportion of the stomach is superior to the transverse colon. Feeding tube extends into the duodenal bulb. Stomach is currently decompressed. Vascular/Lymphatic: Aortic atherosclerosis noted. Negative for aneurysm. No retroperitoneal hemorrhage or hematoma. No abdominal adenopathy. Other: No abdominal wall hernia. No free fluid or ascites. Negative for abscess. Musculoskeletal: No acute osseous finding. Anterior L2 vertebral body lytic lesion without compression pathologic fracture. Appearance is suspicious for metastatic disease or myeloma. IMPRESSION: Stomach anatomy appears favorable for attempt at fluoroscopic gastrostomy insertion. Numerous subcentimeter bilateral pulmonary nodules suspicious for pulmonary metastases. Lytic L2 vertebral body suspicious for osseous metastasis. Atherosclerosis without aneurysm Cholelithiasis Electronically Signed   By: Jerilynn Mages.  Shick M.D.   On: 02/23/2018 16:06   Dg Chest 2  View  Result Date: 02/18/2018 CLINICAL DATA:  Leukocytosis. EXAM: CHEST - 2 VIEW COMPARISON:  Chest x-ray dated December 06, 2017. FINDINGS: Feeding tube noted with the tip likely in the distal stomach. Stable cardiomegaly status post CABG. Normal pulmonary vascularity. Emphysema. No focal consolidation, pleural effusion, or pneumothorax. Unchanged elevation of the right hemidiaphragm. No acute osseous abnormality. IMPRESSION: 1. COPD.  No active cardiopulmonary disease. Electronically Signed   By: Titus Dubin M.D.   On: 02/18/2018 11:37   Ct Soft Tissue Neck W Contrast  Result Date: 02/22/2018 CLINICAL DATA:  Improving dysphagia. Status post laryngoscopy. History of thyroid cancer, thyroidectomy, laryngoplasty, pituitary macroadenoma. EXAM: CT NECK WITH CONTRAST TECHNIQUE: Multidetector CT imaging of the neck was performed using the standard protocol following the bolus administration of intravenous contrast. CONTRAST:  30mL ISOVUE-300 IOPAMIDOL (ISOVUE-300) INJECTION 61% COMPARISON:  CT neck August 25, 2016 FINDINGS: PHARYNX AND LARYNX: Focally sclerotic LEFT thyroid and cricoid cartilages consistent with tumoral involvement. Mild medial deviation RIGHT true vocal cord  could reflect paralysis. Normal pharynx. SALIVARY GLANDS: Normal. THYROID: Status post thyroidectomy. 21 x 14 mm abnormal soft tissue LEFT posterior thyroid bed and appears to invade the anterior status soft [place]. Additional subcentimeter nodules inferior LEFT thyroid bed. LYMPH NODES: 10 mm round LEFT level 4 lymph node. 9 mm LEFT level 7 lymph node with additional smaller bilateral 7 lymph nodes. VASCULAR: Moderate calcific atherosclerosis aortic arch. Moderate calcific atherosclerosis carotid bifurcations. LIMITED INTRACRANIAL: Normal. VISUALIZED ORBITS: Status post bilateral ocular lens implants. MASTOIDS AND VISUALIZED PARANASAL SINUSES: LEFT nasogastric tube. SKELETON: Nonacute.  Patient is edentulous. UPPER CHEST: Centrilobular  emphysema. Multiple LEFT pulmonary nodules measuring to at least 11 mm, some of which are new from prior CT. Moderate centrilobular emphysema. Status post median sternotomy. OTHER: None. IMPRESSION: 1. Status post thyroidectomy with 21 x 14 mm mass LEFT thyroid bed consistent with invasive residual versus recurrent tumor (including suspected esophageal invasion). 2. New pathologic level for lymphadenopathy with probable additional smaller alignment lymph nodes LEFT level 7. 3. Multiple pulmonary nodules, some which are new from prior CT most compatible with progressive metastasis. Aortic Atherosclerosis (ICD10-I70.0) and Emphysema (ICD10-J43.9). Electronically Signed   By: Elon Alas M.D.   On: 02/22/2018 21:14   Ir Gastrostomy Tube Mod Sed  Result Date: 02/24/2018 INDICATION: Thyroid cancer, dysphagia, malnutrition EXAM: FLUOROSCOPIC 20 FRENCH PULL-THROUGH GASTROSTOMY Date:  02/24/2018 02/24/2018 1:43 pm Radiologist:  Jerilynn Mages. Daryll Brod, MD Guidance:  Fluoroscopic MEDICATIONS: 3.375 g Zosyn; Antibiotics were administered within 1 hour of the procedure. Glucagon 0.5 mg IV ANESTHESIA/SEDATION: Versed 1.5 mg IV; Fentanyl 75 mcg IV Moderate Sedation Time:  11 minutes The patient was continuously monitored during the procedure by the interventional radiology nurse under my direct supervision. CONTRAST:  10 cc Isovue-administered into the gastric lumen. FLUOROSCOPY TIME:  Fluoroscopy Time: 1 minutes 18 seconds (12 mGy). COMPLICATIONS: None immediate. PROCEDURE: Informed consent was obtained from the patient following explanation of the procedure, risks, benefits and alternatives. The patient understands, agrees and consents for the procedure. All questions were addressed. A time out was performed. Maximal barrier sterile technique utilized including caps, mask, sterile gowns, sterile gloves, large sterile drape, hand hygiene, and betadine prep. The left upper quadrant was sterilely prepped and draped. An oral  gastric catheter was inserted into the stomach under fluoroscopy. The existing nasogastric feeding tube was removed. Air was injected into the stomach for insufflation and visualization under fluoroscopy. The air distended stomach was confirmed beneath the anterior abdominal wall in the frontal and lateral projections. Under sterile conditions and local anesthesia, a 91 gauge trocar needle was utilized to access the stomach percutaneously beneath the left subcostal margin. Needle position was confirmed within the stomach under biplane fluoroscopy. Contrast injection confirmed position also. A single T tack was deployed for gastropexy. Over an Amplatz guide wire, a 9-French sheath was inserted into the stomach. A snare device was utilized to capture the oral gastric catheter. The snare device was pulled retrograde from the stomach up the esophagus and out the oropharynx. The 20-French pull-through gastrostomy was connected to the snare device and pulled antegrade through the oropharynx down the esophagus into the stomach and then through the percutaneous tract external to the patient. The gastrostomy was assembled externally. Contrast injection confirms position in the stomach. Images were obtained for documentation. The patient tolerated procedure well. No immediate complication. IMPRESSION: Fluoroscopic insertion of a 20-French "pull-through" gastrostomy. Electronically Signed   By: Jerilynn Mages.  Shick M.D.   On: 02/24/2018 13:45   Dg Chest Port 1  View  Result Date: 02/22/2018 CLINICAL DATA:  Dyspnea. History of COPD, previous MI, atrial flutter, chronic CHF. Former smoker. EXAM: PORTABLE CHEST 1 VIEW COMPARISON:  PA and lateral chest x-ray of February 18, 2018. FINDINGS: The lungs are adequately inflated and clear. The heart and pulmonary vascularity are normal. The mediastinum is normal in width. There are post CABG changes. There is calcification in the wall of the aortic arch. IMPRESSION: Previous CABG. No pneumonia,  CHF, nor other acute cardiopulmonary abnormality. Electronically Signed   By: David  Martinique M.D.   On: 02/22/2018 14:22   Dg Addison Bailey G Tube Plc W/fl W/rad  Result Date: 02/15/2018 CLINICAL DATA:  Unable to swallow.  Need for feeding tube EXAM: NASO G TUBE PLACEMENT WITH FL AND WITH RAD CONTRAST:  10 mL Isovue contrast injected to the feeding tube. FLUOROSCOPY TIME:  Fluoroscopy Time:  8 minutes 4 seconds Radiation Exposure Index (if provided by the fluoroscopic device): Number of Acquired Spot Images: 0 COMPARISON:  Speech pathology study 02/15/2018 FINDINGS: The technologist initially attempted to place the tube, and had difficulty passing the tube through the pharynx into the esophagus. My presence was requested. After some effort, the tube was advanced into the stomach using a Glidewire. The tube was placed in the antrum however I was not able to get the tube into the duodenum despite multiple attempts with the tube and wire. Tube was left in the gastric antrum IMPRESSION: Feeding tube placed with the tip in the gastric antrum. I was not able to get the tube into the duodenum. Electronically Signed   By: Franchot Gallo M.D.   On: 02/15/2018 14:00   Dg Swallowing Func-speech Pathology  Result Date: 02/19/2018 Objective Swallowing Evaluation: Type of Study: MBS-Modified Barium Swallow Study  Patient Details Name: John Sampson MRN: [id number] Date of Birth: 1936-02-25 Today's Date: 02/19/2018 Time: SLP Start Time (ACUTE ONLY): 1215 -SLP Stop Time (ACUTE ONLY): 1240 SLP Time Calculation (min) (ACUTE ONLY): 25 min Past Medical History: Past Medical History: Diagnosis Date . Atrial flutter ([place])   on Eliquis . AV malformation of GI tract  . COPD (chronic obstructive pulmonary disease) ([place]) 3.12.14  2D Echo, EF 50-55% . Coronary atherosclerosis of native coronary artery   Multivessel status post CABG . Gastric ulcer   Small - nonbleeding . GERD (gastroesophageal reflux disease)  . Headache(784.0)  . Hypothyroidism   . Iron deficiency anemia   Negative Givens capsule study  . Myocardial infarction ([place])  . PAD (peripheral artery disease) (HCC)   Moderate bilateral SFA disease at angiography 01/2013 . Pituitary macroadenoma ([place])  . Thyroid cancer ([place])  . Vocal cord paralysis   left Past Surgical History: Past Surgical History: Procedure Laterality Date . ABDOMINAL AORTAGRAM N/A 02/19/2013  Procedure: ABDOMINAL Maxcine Ham;  Surgeon: [name], MD;  Location: [facility] CATH LAB;  Service: Cardiovascular;  Laterality: N/A; . CARDIAC CATHETERIZATION   . CARDIOVERSION N/A 06/16/2015  Procedure: CARDIOVERSION;  Surgeon: Satira Sark, MD;  Location: [facility] ORS;  Service: Cardiovascular;  Laterality: N/A; . COLONOSCOPY  08/18/2012  Procedure: COLONOSCOPY;  Surgeon: [name], MD;  Location: [facility] ENDO SUITE;  Service: Endoscopy;  Laterality: N/A;  1:25 . COLONOSCOPY N/A 09/12/2017  Procedure: COLONOSCOPY;  Surgeon: [name], MD;  [facility] ENDO SU[facility];  Service: Endoscopy;  Laterality: N/A; . CORONARY ARTERY BYPASS GRAFT  2003  5 grafts - details not clear . CRANIOTOMY  12/31/2011  Procedure: CRANIOTOMY HYPOPHYSECTOMY TRANSNASAL APPROACH;  Surgeon: [name], MD;  Location: [place] NEURO ORS;  Service: Neurosurgery;  Laterality: N/A;  Transphenoidal Hypophysectomy With Fat Graft Harvest from right abdomen  . ESOPHAGOGASTRODUODENOSCOPY  08/18/2012  Procedure: ESOPHAGOGASTRODUODENOSCOPY (EGD);  Surgeon: [name], MD;  [facility];  Service: Endoscopy;  Laterality: N/A; . ESOPHAGOGASTRODUODENOSCOPY N/A 03/31/2017  Procedure: ESOPHAGOGASTRODUODENOSCOPY (EGD);  Surgeon: [name], MD;  [facility];  Service: Endoscopy;  Laterality: N/A; . ESOPHAGOGASTRODUODENOSCOPY N/A 09/12/2017  Procedure: ESOPHAGOGASTRODUODENOSCOPY (EGD);  Surgeon: [name], MD;  [facility];  Service: Endoscopy;  Laterality: N/A;  10:40 . EYE SURGERY  2012 . GIVENS CAPSULE STUDY N/A 01/25/2013  Procedure: GIVENS  CAPSULE STUDY;  Surgeon: [name], MD;  [facility];  Service: Endoscopy;  Laterality: N/A;  730 . HOT HEMOSTASIS  03/31/2017  Procedure: HOT HEMOSTASIS (ARGON PLASMA COAGULATION/BICAP);  Surgeon: [name], MD;  [facility];  Service: Endoscopy;;  duodenum . LARYNGOPLASTY Left 09/14/2016  Procedure: LARYNGOPLASTY;  Surgeon: [name], MD;  Location: [place];  Service: ENT;  Laterality: Left; . LOWER EXTREMITY ANGIOGRAM N/A 02/19/2013  Procedure: LOWER EXTREMITY ANGIOGRAM;  Surgeon: [name], MD;  Location: [facility] CATH LAB;  Service: Cardiovascular;  Laterality: N/A; . MICROLARYNGOSCOPY N/A 02/10/2018  Procedure: MICROLARYNGOSCOPY WITH VOCAL CORD INJECTION;  Surgeon: [name], MD;  Location: [place];  Service: ENT;  Laterality: N/A; . PERIPHERAL VASCULAR CATHETERIZATION N/A 01/19/2016  Procedure: Abdominal Aortogram;  Surgeon: Conrad Furnace Creek, MD;  Location: [place] CV LAB;  Service: Cardiovascular;  Laterality: N/A; . PERIPHERAL VASCULAR CATHETERIZATION Bilateral 01/19/2016  Procedure: Lower Extremity Angiography;  Surgeon: Conrad Corrigan, MD;  Location: [place] CV LAB;  Service: Cardiovascular;  Laterality: Bilateral; . PV Angiogram  02/19/13  Indications: slow healing left calf ulcer . Stress Myocardial Perfusion  12/08/2011  Indications: Abnormal EKG, Eval of prior GABG . TEE WITHOUT CARDIOVERSION N/A 06/16/2015  Procedure: TRANSESOPHAGEAL ECHOCARDIOGRAM (TEE);  Surgeon: Satira Sark, MD;  Location: [facility];  Service: Cardiovascular;  Laterality: N/A; . THYROIDECTOMY N/A 09/14/2016  Procedure: TOTAL THYROIDECTOMY;  Surgeon: [name], MD;  Location: [place];  Service: ENT;  Laterality: N/A; . TONSILLECTOMY  Age 19 HPI: John Penna Cookeis a 82 y.o.malewith medical history significant ofleft papillary thyroid carcinoma with paralysis of left vocal cord. Also with total thyroidectomy and laryngoplasty. He presented to [facility]  increasing SOB and hoarseness. He was taken to the OR on 3/15 by Dr. Lucia Gaskins for injection of left vocal cord to strengthen his voice. He presented today to Dr. Pollie Friar office and there was concern for dehydration and dysphagia so he was sent to [facility] for direct admission (actually observation). Since the procedure, he has been able to have any solid or liquid food - it just won't go down or it goes down and comes right back up. Also unable to swallow any pills. He had the procedure Friday and did fine. He was told to go home with a soft diet and he was able to eat and drink that night. He ate breakfast ok Saturday but he wasn't able to eat after that. Dr. Shirlee More that on in-office exam the vocal cords are clear without swelling or infection but the patient was unable to swallow water in his office. He recommends IVF, labs, modified barium swallow evaluationfor esophageal obstruction/aspiration.  Subjective: Alert, cooperative Assessment / Plan / Recommendation CHL IP CLINICAL IMPRESSIONS 02/19/2018 Clinical Impression Patient presents with improving, more moderate cervical esophageal dysphagia  due to obstructing tissue at C5/6 and C7. Pt is able to pass approximately ~50% bolus through the cervical esophagus in head neutral position, however there is penetration of backflow after the swallow (worse with liquids) with all consistencies, and there is sensed aspiration with thin liquids. Compensatory manuevers attempted, and most effective was head turn left with puree and honey-thick liquids and multiple dry swallows to decrease residue. Pt then able to hock to expectorate mild residue that remained in the vallecule. Head turn did not prevent penetration of backflow of thin and nectar thick liquids from the cervical esophagus. Chin tuck was not effective. Pt educated in real time using video feedback re: use of strategies and frequent oral care to reduce aspiration risk. Recommend inititating dys 1  (puree) and honey thick liquids with head turn left and multiple swallows per bite, cough or hock to expectorate periodically. As consuming POs will be a lengthy and laborious process due to number of swallows required, would consider continuation of cortrak for now as pt may have difficulty maintaining adequate nutrition/hydration with POs alone. Pt encouraged to complete thorough oral care before and after meals.  SLP Visit Diagnosis Dysphagia, pharyngoesophageal phase (R13.14) Attention and concentration deficit following -- Frontal lobe and executive function deficit following -- Impact on safety and function Moderate aspiration risk   CHL IP TREATMENT RECOMMENDATION 02/19/2018 Treatment Recommendations Therapy as outlined in treatment plan below   Prognosis 02/19/2018 Prognosis for Safe Diet Advancement Good Barriers to Reach Goals -- Barriers/Prognosis Comment -- CHL IP DIET RECOMMENDATION 02/19/2018 SLP Diet Recommendations Dysphagia 1 (Puree) solids;Honey thick liquids;Alternative means - temporary Liquid Administration via Cup;Spoon Medication Administration Via alternative means Compensations Multiple dry swallows after each bite/sip;Effortful swallow;Other (Comment) Postural Changes Seated upright at 90 degrees   CHL IP OTHER RECOMMENDATIONS 02/19/2018 Recommended Consults -- Oral Care Recommendations Oral care before and after PO Other Recommendations Order thickener from pharmacy;Prohibited food (jello, ice cream, thin soups);Remove water pitcher;Have oral suction available   CHL IP FOLLOW UP RECOMMENDATIONS 02/19/2018 Follow up Recommendations Other (comment)   CHL IP FREQUENCY AND DURATION 02/19/2018 Speech Therapy Frequency (ACUTE ONLY) min 2x/week Treatment Duration 2 weeks      CHL IP ORAL PHASE 02/19/2018 Oral Phase WFL Oral - Pudding Teaspoon -- Oral - Pudding Cup -- Oral - Honey Teaspoon -- Oral - Honey Cup -- Oral - Nectar Teaspoon -- Oral - Nectar Cup -- Oral - Nectar Straw -- Oral - Thin Teaspoon --  Oral - Thin Cup -- Oral - Thin Straw -- Oral - Puree -- Oral - Mech Soft -- Oral - Regular -- Oral - Multi-Consistency -- Oral - Pill -- Oral Phase - Comment --  CHL IP PHARYNGEAL PHASE 02/19/2018 Pharyngeal Phase WFL Pharyngeal- Pudding Teaspoon -- Pharyngeal -- Pharyngeal- Pudding Cup -- Pharyngeal -- Pharyngeal- Honey Teaspoon -- Pharyngeal -- Pharyngeal- Honey Cup -- Pharyngeal -- Pharyngeal- Nectar Teaspoon -- Pharyngeal -- Pharyngeal- Nectar Cup -- Pharyngeal -- Pharyngeal- Nectar Straw -- Pharyngeal -- Pharyngeal- Thin Teaspoon -- Pharyngeal -- Pharyngeal- Thin Cup -- Pharyngeal -- Pharyngeal- Thin Straw -- Pharyngeal -- Pharyngeal- Puree -- Pharyngeal -- Pharyngeal- Mechanical Soft -- Pharyngeal -- Pharyngeal- Regular -- Pharyngeal -- Pharyngeal- Multi-consistency -- Pharyngeal -- Pharyngeal- Pill -- Pharyngeal -- Pharyngeal Comment --  CHL IP CERVICAL ESOPHAGEAL PHASE 02/19/2018 Cervical Esophageal Phase Impaired Pudding Teaspoon -- Pudding Cup -- Honey Teaspoon Reduced cricopharyngeal relaxation;Esophageal backflow into the pharynx;Esophageal backflow into the larynx Honey Cup Reduced cricopharyngeal relaxation;Esophageal backflow into the pharynx;Esophageal backflow into the  larynx Nectar Teaspoon -- Nectar Cup Reduced cricopharyngeal relaxation;Esophageal backflow into the pharynx;Esophageal backflow into the larynx Nectar Straw -- Thin Teaspoon Reduced cricopharyngeal relaxation;Esophageal backflow into the pharynx;Esophageal backflow into the larynx Thin Cup Reduced cricopharyngeal relaxation;Esophageal backflow into the pharynx;Esophageal backflow into the larynx Thin Straw NT Puree Reduced cricopharyngeal relaxation;Esophageal backflow into the pharynx;Esophageal backflow into the larynx Mechanical Soft -- Regular -- Multi-consistency -- Pill -- Cervical Esophageal Comment head turn left prevents penetration with honey thick liquids, puree only Deneise Lever, MS, [place]  [phone] No flowsheet data found. Aliene Altes 02/19/2018, 1:58 PM              Dg Swallowing Func-speech Pathology  Result Date: 02/15/2018 Objective Swallowing Evaluation: Type of Study: MBS-Modified Barium Swallow Study  Patient Details Name: John Sampson MRN: [id number] Date of Birth: 1936-10-20 Today's Date: 02/15/2018 Time: SLP Start Time (ACUTE ONLY): 1000 -SLP Stop Time (ACUTE ONLY): 1027 SLP Time Calculation (min) (ACUTE ONLY): 27 min Past Medical History: Past Medical History: Diagnosis Date . Atrial flutter ([place])   on Eliquis . AV malformation of GI tract  . COPD (chronic obstructive pulmonary disease) ([place]) 3.12.14  2D Echo, EF 50-55% . Coronary atherosclerosis of native coronary artery   Multivessel status post CABG . Gastric ulcer   Small - nonbleeding . GERD (gastroesophageal reflux disease)  . Headache(784.0)  . Hypothyroidism  . Iron deficiency anemia   Negative Givens capsule study  . Myocardial infarction ([place])  . PAD (peripheral artery disease) ([facility])   Moderate bilateral SFA disease at angiography 01/2013 . Pituitary macroadenoma ([place])  . Thyroid cancer ([place])  . Vocal cord paralysis   left Past Surgical History: Past Surgical History: Procedure Laterality Date . ABDOMINAL AORTAGRAM N/A 02/19/2013  Procedure: ABDOMINAL Maxcine Ham;  Surgeon: [name], MD;  Location: [facility] CATH LAB;  Service: Cardiovascular;  Laterality: N/A; . CARDIAC CATHETERIZATION   . CARDIOVERSION N/A 06/16/2015  Procedure: CARDIOVERSION;  Surgeon: Satira Sark, MD;  Location: [facility] ORS;  Service: Cardiovascular;  Laterality: N/A; . COLONOSCOPY  08/18/2012  Procedure: COLONOSCOPY;  Surgeon: [name], MD;  Location[facility] ENDO SUITE;  Service: Endoscopy;  Laterality: N/A;  1:25 . COLONOSCOPY N/A 09/12/2017  Procedure: COLONOSCOPY;  Surgeon: [name], MD;  [facility];  Service: Endoscopy;  Laterality: N/A; . CORONARY ARTERY BYPASS GRAFT  2003  5 grafts - details not clear . CRANIOTOMY  12/31/2011   Procedure: CRANIOTOMY HYPOPHYSECTOMY TRANSNASAL APPROACH;  Surgeon: [name], MD;  Location: [place] NEURO ORS;  Service: Neurosurgery;  Laterality: N/A;  Transphenoidal Hypophysectomy With Fat Graft Harvest from right abdomen  . ESOPHAGOGASTRODUODENOSCOPY  08/18/2012  Procedure: ESOPHAGOGASTRODUODENOSCOPY (EGD);  Surgeon: [name], MD;  [facility];  Service: Endoscopy;  Laterality: N/A; . ESOPHAGOGASTRODUODENOSCOPY N/A 03/31/2017  Procedure: ESOPHAGOGASTRODUODENOSCOPY (EGD);  Surgeon: [name], MD;  [facility];  Service: Endoscopy;  Laterality: N/A; . ESOPHAGOGASTRODUODENOSCOPY N/A 09/12/2017  Procedure: ESOPHAGOGASTRODUODENOSCOPY (EGD);  Surgeon: [name], MD;  [facility];  Service: Endoscopy;  Laterality: N/A;  10:40 . EYE SURGERY  2012 . GIVENS CAPSULE STUDY N/A 01/25/2013  Procedure: GIVENS CAPSULE STUDY;  Surgeon: [name], MD;  [facility];  Service: Endoscopy;  Laterality: N/A;  730 . HOT HEMOSTASIS  03/31/2017  Procedure: HOT HEMOSTASIS (ARGON PLASMA COAGULATION/BICAP);  Surgeon: [name], MD;  [facility];  Service: Endoscopy;;  duodenum . LARYNGOPLASTY Left 09/14/2016  Procedure:  LARYNGOPLASTY;  Surgeon: [name], MD;  Location: [place];  Service: ENT;  Laterality: Left; . LOWER EXTREMITY ANGIOGRAM N/A 02/19/2013  Procedure: LOWER EXTREMITY ANGIOGRAM;  Surgeon: [name], MD;  Location: [facility] CATH LAB;  Service: Cardiovascular;  Laterality: N/A; . MICROLARYNGOSCOPY N/A 02/10/2018  Procedure: MICROLARYNGOSCOPY WITH VOCAL CORD INJECTION;  Surgeon: [name], MD;  Location: [place];  Service: ENT;  Laterality: N/A; . PERIPHERAL VASCULAR CATHETERIZATION N/A 01/19/2016  Procedure: Abdominal Aortogram;  Surgeon: Conrad Audubon, MD;  Location: [place] CV LAB;  Service: Cardiovascular;  Laterality: N/A; . PERIPHERAL VASCULAR CATHETERIZATION Bilateral 01/19/2016  Procedure: Lower  Extremity Angiography;  Surgeon: Conrad Laton, MD;  Location: [place] CV LAB;  Service: Cardiovascular;  Laterality: Bilateral; . PV Angiogram  02/19/13  Indications: slow healing left calf ulcer . Stress Myocardial Perfusion  12/08/2011  Indications: Abnormal EKG, Eval of prior GABG . TEE WITHOUT CARDIOVERSION N/A 06/16/2015  Procedure: TRANSESOPHAGEAL ECHOCARDIOGRAM (TEE);  Surgeon: Satira Sark, MD;  Location[facility];  Service: Cardiovascular;  Laterality: N/A; . THYROIDECTOMY N/A 09/14/2016  Procedure: TOTAL THYROIDECTOMY;  Surgeon: [name], MD;  Location: [place];  Service: ENT;  Laterality: N/A; . 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 Also with total thyroidectomy and laryngoplasty. He presented [facility] increasing SOB and hoarseness. He was taken to the OR on 3/15 by Dr. Lucia Gaskins for injection of left vocal cord to strengthen his voice. He presented today to Dr. Pollie Friar office and there was concern for dehydration and dysphagia so he was sent to [facility] for direct admission (actually observation). Since the procedure, he has been able to have any solid or liquid food - it just won't go down or it goes down and comes right back up. Also unable to swallow any pills. He had the procedure Friday and did fine. He was told to go home with a soft diet and he was able to eat and drink that night. He ate breakfast ok Saturday but he wasn't able to eat after that. Dr. Shirlee More that on in-office exam the vocal cords are clear without swelling or infection but the patient was unable to swallow water in his office. He recommends IVF, labs, modified barium swallow evaluationfor esophageal obstruction/aspiration.  No Data Recorded Assessment / Plan / Recommendation CHL IP CLINICAL IMPRESSIONS 02/15/2018 Clinical Impression Pt demosntrates a severe cervical esophageal  dysphagia with obstructing tissue at C5/6 and C7. Pt is unable to pass 1/4 teaspoon of thin liquids despite maximal effortful swallow or a head turn R or L. Chin tuck also ineffective. Attempted large bolus to adress if heavier propulsion could force more opening without success. 90% or more of the bolus returned to the pharynx and was aspriated with sensation. Pt does have a hard cough ability and can expectorate residue. Recommend pt remain fully NPO. Discussed result with MD, will need ENT to review study images and address needs. Pt may need a temporary means of nutrition, though passage of any NG would likely be difficult even under fluoroscopy and could be contraindicated. Esophagram discontinued due to pt inability to consume any quantity of barium for further esophageal assessment.  SLP Visit Diagnosis Dysphagia, pharyngoesophageal phase (R13.14) Attention and concentration deficit following -- Frontal lobe and executive function deficit following -- Impact on safety and function Severe aspiration risk;Risk for inadequate nutrition/hydration   CHL IP TREATMENT  RECOMMENDATION 02/15/2018 Treatment Recommendations Therapy as outlined in treatment plan below   Prognosis 02/15/2018 Prognosis for Safe Diet Advancement Good Barriers to Reach Goals -- Barriers/Prognosis Comment -- CHL IP DIET RECOMMENDATION 02/15/2018 SLP Diet Recommendations NPO;Alternative means - temporary Liquid Administration via -- Medication Administration Via alternative means Compensations -- Postural Changes --   CHL IP OTHER RECOMMENDATIONS 02/15/2018 Recommended Consults Consider ENT evaluation Oral Care Recommendations -- Other Recommendations --   CHL IP FOLLOW UP RECOMMENDATIONS 02/15/2018 Follow up Recommendations 24 hour supervision/assistance   CHL IP FREQUENCY AND DURATION 02/15/2018 Speech Therapy Frequency (ACUTE ONLY) min 2x/week Treatment Duration 2 weeks      CHL IP ORAL PHASE 02/15/2018 Oral Phase WFL Oral - Pudding Teaspoon --  Oral - Pudding Cup -- Oral - Honey Teaspoon -- Oral - Honey Cup -- Oral - Nectar Teaspoon -- Oral - Nectar Cup -- Oral - Nectar Straw -- Oral - Thin Teaspoon -- Oral - Thin Cup -- Oral - Thin Straw -- Oral - Puree -- Oral - Mech Soft -- Oral - Regular -- Oral - Multi-Consistency -- Oral - Pill -- Oral Phase - Comment --  CHL IP PHARYNGEAL PHASE 02/15/2018 Pharyngeal Phase WFL Pharyngeal- Pudding Teaspoon -- Pharyngeal -- Pharyngeal- Pudding Cup -- Pharyngeal -- Pharyngeal- Honey Teaspoon -- Pharyngeal -- Pharyngeal- Honey Cup -- Pharyngeal -- Pharyngeal- Nectar Teaspoon -- Pharyngeal -- Pharyngeal- Nectar Cup -- Pharyngeal -- Pharyngeal- Nectar Straw -- Pharyngeal -- Pharyngeal- Thin Teaspoon -- Pharyngeal -- Pharyngeal- Thin Cup -- Pharyngeal -- Pharyngeal- Thin Straw -- Pharyngeal -- Pharyngeal- Puree -- Pharyngeal -- Pharyngeal- Mechanical Soft -- Pharyngeal -- Pharyngeal- Regular -- Pharyngeal -- Pharyngeal- Multi-consistency -- Pharyngeal -- Pharyngeal- Pill -- Pharyngeal -- Pharyngeal Comment --  CHL IP CERVICAL ESOPHAGEAL PHASE 02/15/2018 Cervical Esophageal Phase Impaired Pudding Teaspoon -- Pudding Cup -- Honey Teaspoon -- Honey Cup -- Nectar Teaspoon -- Nectar Cup -- Nectar Straw -- Thin Teaspoon Reduced cricopharyngeal relaxation;Esophageal backflow into the pharynx;Esophageal backflow into the larynx Thin Cup -- Thin Straw Reduced cricopharyngeal relaxation;Esophageal backflow into the pharynx;Esophageal backflow into the larynx Puree -- Mechanical Soft -- Regular -- Multi-consistency -- Pill -- Cervical Esophageal Comment -- No flowsheet data found. John Sampson 02/15/2018, 10:41 AM                CBC Recent Labs  Lab 02/20/18 0512 02/21/18 1037 02/22/18 0425 02/23/18 0406 02/25/18 0707  WBC 21.8* 20.4* 22.8* 24.6* 14.6*  HGB 12.6* 12.0* 11.8* 12.0* 11.7*  HCT 40.6 39.1 39.4 38.9* 38.3*  PLT 325 274 263 254 219  MCV 84.6 85.6 84.7 83.3 85.1  MCH 26.3 26.3 25.4* 25.7* 26.0  MCHC  31.0 30.7 29.9* 30.8 30.5  RDW 20.9* 20.9* 20.7* 20.5* 20.6*   Chemistries  Recent Labs  Lab 02/20/18 0512 02/22/18 0425 02/23/18 0406 02/25/18 0707  NA 134* 134* 134* 134*  K 5.0 5.1 3.8 4.2  CL 99* 96* 96* 102  CO2 25 30 28 25   GLUCOSE 142* 149* 130* 98  BUN 49* 48* 43* 24*  CREATININE 0.92 1.03 0.98 0.97  CALCIUM 8.7* 8.7* 8.7* 8.5*   ------------------------------------------------------------------------------------------------------------------ No results for input(s): CHOL, HDL, LDLCALC, TRIG, CHOLHDL, LDLDIRECT in the last 72 hours.  Lab Results  Component Value Date   HGBA1C 5.8 (H) 08/14/2013  ------------------------------------------------------------------------------------------------------------------ No results for input(s): TSH, T4TOTAL, T3FREE, THYROIDAB in the last 72 hours.  Invalid input(s): FREET3 ------------------------------------------------------------------------------------------------------------------ No results for input(s): VITAMINB12, FOLATE, FERRITIN, TIBC, IRON, RETICCTPCT in the last 72 hours.  Coagulation  profile Recent Labs  Lab 02/23/18 2053  INR 1.00   No results for input(s): DDIMER in the last 72 hours.  Cardiac Enzymes No results for input(s): CKMB, TROPONINI, MYOGLOBIN in the last 168 hours.  Invalid input(s): CK ------------------------------------------------------------------------------------------------------------------    Component Value Date/Time   BNP 305.0 (H) 12/06/2017 1232   Roxan Hockey M.D on 02/26/2018 at 5:39 PM  Between 7am to 7pm - Pager - [phone]  After 7pm go to [email or web address] - password [email or web address]  [facility] -  Office  [phone]  Voice Recognition Viviann Spare dictation system was used to create this note, attempts have been made to correct errors. Please contact the author with questions and/or clarifications.

## 2018-02-27 LAB — BASIC METABOLIC PANEL
Anion gap: 6 (ref 5–15)
BUN: 23 mg/dL — AB (ref 6–20)
CO2: 28 mmol/L (ref 22–32)
CREATININE: 0.83 mg/dL (ref 0.61–1.24)
Calcium: 8.8 mg/dL — ABNORMAL LOW (ref 8.9–10.3)
Chloride: 100 mmol/L — ABNORMAL LOW (ref 101–111)
GFR calc Af Amer: >60 mL/min (ref >60)
Glucose, Bld: 123 mg/dL — ABNORMAL HIGH (ref 65–99)
POTASSIUM: 3.8 mmol/L (ref 3.5–5.1)
SODIUM: 134 mmol/L — AB (ref 135–145)

## 2018-02-27 LAB — GLUCOSE, CAPILLARY
GLUCOSE-CAPILLARY: 112 mg/dL — AB (ref 65–99)
Glucose-Capillary: 100 mg/dL — ABNORMAL HIGH (ref 65–99)
Glucose-Capillary: 107 mg/dL — ABNORMAL HIGH (ref 65–99)
Glucose-Capillary: 109 mg/dL — ABNORMAL HIGH (ref 65–99)
Glucose-Capillary: 110 mg/dL — ABNORMAL HIGH (ref 65–99)
Glucose-Capillary: 135 mg/dL — ABNORMAL HIGH (ref 65–99)

## 2018-02-27 LAB — CBC
HCT: 34.9 % — ABNORMAL LOW (ref 39.0–52.0)
Hemoglobin: 10.8 g/dL — ABNORMAL LOW (ref 13.0–17.0)
MCH: 26.2 pg (ref 26.0–34.0)
MCHC: 30.9 g/dL (ref 30.0–36.0)
MCV: 84.7 fL (ref 78.0–100.0)
PLATELETS: 235 10*3/uL (ref 150–400)
RBC: 4.12 MIL/uL — AB (ref 4.22–5.81)
RDW: 20.6 % — ABNORMAL HIGH (ref 11.5–15.5)
WBC: 13.1 10*3/uL — ABNORMAL HIGH (ref 4.0–10.5)

## 2018-02-27 NOTE — Progress Notes (Signed)
Physical Therapy Treatment Patient Details Name: John Sampson MRN: [id number] DOB: [birth year] Today's Date: 02/27/2018    History of Present Illness Pt is a 82 y.o. 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  Also with total thyroidectomy and laryngoplasty.  He presented to ENT with increasing SOB and hoarseness.  He was taken to the OR on 3/15 by Dr. Lucia Gaskins for injection of left vocal cord to strengthen his voice.   He presented today to Dr. Pollie Friar office and there was concern for dehydration and dysphagia so he was sent to [facility]. Went home after this but was still unable to swallow so returned and now has NG tube - now s/p PEG. -CT neck 3/27 fairly extensive recurrence of his thyroid cancer adjacent to the trachea as well as into the anterior wall of his esophagus and probable lung mets     PT Comments    Pt progressing towards physical therapy goals. Was able to perform transfers and ambulation with gross min guard assist to min assist for balance support and safety with RW. Tolerance for functional activity continues to be low, however pt demonstrated an overall good rehab effort today. Will continue to follow.   Follow Up Recommendations  SNF     Equipment Recommendations  Rolling walker with 5" wheels    Recommendations for Other Services       Precautions / Restrictions Precautions Precautions: Fall Restrictions Weight Bearing Restrictions: No Other Position/Activity Restrictions: PEG tube    Mobility  Bed Mobility               General bed mobility comments: Pt sitting up in recliner upon PT arrival.   Transfers Overall transfer level: Needs assistance Equipment used: Rolling walker (2 wheeled) Transfers: Sit to/from Stand Sit to Stand: Min assist         General transfer comment: Steadying assist to power up to full stand. VC's for hand placement on seated surface for safety.    Ambulation/Gait Ambulation/Gait assistance: Min guard Ambulation Distance (Feet): 80 Feet Assistive device: Rolling walker (2 wheeled) Gait Pattern/deviations: Step-through pattern;Shuffle;Trunk flexed Gait velocity: Decreased Gait velocity interpretation: Below normal speed for age/gender General Gait Details: 4 standing rest breaks throughout gait training. Pt audibly dyspneic and cued for pursed-lip breathing throughout ambulation.    Stairs            Wheelchair Mobility    Modified Rankin (Stroke Patients Only)       Balance Overall balance assessment: Needs assistance Sitting-balance support: Single extremity supported Sitting balance-Leahy Scale: Fair     Standing balance support: Bilateral upper extremity supported Standing balance-Leahy Scale: Poor Standing balance comment: reliant on UE support                            Cognition Arousal/Alertness: Awake/alert Behavior During Therapy: Flat affect Overall Cognitive Status: Within Functional Limits for tasks assessed                                        Exercises General Exercises - Lower Extremity Ankle Circles/Pumps: 20 reps Quad Sets: 15 reps Long Arc Quad: 10 reps Hip ABduction/ADduction: 10 reps    General Comments        Pertinent Vitals/Pain Pain Assessment: Faces Faces Pain Scale: No hurt    Home  Living Family/patient expects to be discharged to:: Private residence                    Prior Function            PT Goals (current goals can now be found in the care plan section) Acute Rehab PT Goals PT Goal Formulation: With patient Time For Goal Achievement: 03/07/18 Potential to Achieve Goals: Good Progress towards PT goals: Progressing toward goals    Frequency    Min 3X/week      PT Plan Current plan remains appropriate    Co-evaluation              AM-PAC PT "6 Clicks" Daily Activity  Outcome Measure  Difficulty  turning over in bed (including adjusting bedclothes, sheets and blankets)?: A Little Difficulty moving from lying on back to sitting on the side of the bed? : A Little Difficulty sitting down on and standing up from a chair with arms (e.g., wheelchair, bedside commode, etc,.)?: A Little Help needed moving to and from a bed to chair (including a wheelchair)?: A Little Help needed walking in hospital room?: A Little Help needed climbing 3-5 steps with a railing? : A Lot 6 Click Score: 17    End of Session Equipment Utilized During Treatment: Gait belt;Oxygen;Other (comment)(Abdominal binder) Activity Tolerance: Patient limited by fatigue Patient left: in chair;with call bell/phone within reach Nurse Communication: Mobility status PT Visit Diagnosis: Unsteadiness on feet (R26.81);Muscle weakness (generalized) (M62.81);Difficulty in walking, not elsewhere classified (R26.2)     Time: 0093-8182 PT Time Calculation (min) (ACUTE ONLY): 26 min  Charges:  $Gait Training: 8-22 mins $Therapeutic Exercise: 8-22 mins                    G Codes:       Rolinda Roan, PT, DPT Acute Rehabilitation Services Pager: [phone]    Thelma Comp 02/27/2018, 12:42 PM

## 2018-02-27 NOTE — Progress Notes (Signed)
Patient Demographics:    John Sampson, is a 82 y.o. male, DOB - [birth year], VOH:[id number]  Admit date - 02/14/2018   Admitting Physician John Girt, DO  Outpatient Primary MD for the patient is John Du, MD  LOS - 12   No chief complaint on file.       Subjective:    John Sampson today has no fevers, no emesis,  No chest pain, tolerating tube feed well, no new concerns, minimum oral intake, Awaiting placement of skilled nursing facility pending insurance approval  Assessment  & Plan :    Principal Problem:   Dysphagia Active Problems:   Essential hypertension, benign   Mixed hyperlipidemia   PAF (paroxysmal atrial fibrillation) (HCC)   Chronic diastolic congestive heart failure (HCC)   Hypothyroidism (acquired)   Abrasion of lower leg, left, initial encounter   Protein-calorie malnutrition, severe  Principal Problem:   Dysphagia Active Problems:   Essential hypertension, benign   Mixed hyperlipidemia   PAF (paroxysmal atrial fibrillation) (HCC)   Chronic diastolic congestive heart failure (HCC)   Hypothyroidism (acquired)   Abrasion of lower leg, left, initial encounter   Protein-calorie malnutrition, severe   Brief Narrative / Interim history: John Penna Cookeis a 82 y.o.malewith medical history significant ofleft 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 He presented to [facility] increasing SOB and hoarseness found to have VOcal cord paralysis. He was taken to the OR on 3/15 by John Sampson and underwent prolaryn injection to vocal cords, then presented 3/19 to John Sampson office and there was concern for dehydration and dysphagia so he was sent to [facility] for direct admission. Since the procedure, he has been able to have any solid or liquid food. Will Modified Barium swallow 3/20 with severe cervical  esophageal swelling with resultant dysphagia with obstructing tissue at C5/6 and C7. Pt is unable to pass1/4 teaspoon of thin liquids despite maximal effortful swallow, -ENT consulted,3/20 started steroids and Abx, Cortrak placed for tube feeds -no significant improvement in dysphagia yet, -CT neck 3/54fairly extensive recurrence of his thyroid cancer adjacent to the trachea as well as into the anterior wall of his esophagusand probable lung mets -PEG placement 3/29     Assessment & Plan:   1)Severe Cervical esophageal swelling with dysphagia - -Status post prolaryn injection to vocal cords 3/15 for vocal cord paralysis,now has profound dysphagia, MBS 02/15/18 which noted Severe cervical esophageal swelling with resultant dysphagia with obstructing tissue at C5/6 and C7. -ENT, John Sampson following,treated with 1 week of ABx and IV decadron, no significant improvement in his dysphagia and continues to have significant cervical esophageal swelling , -CT neck 3/27 completed showed extensive recurrence of his thyroid cancer adjacent to the trachea and involving esophagus and probable lung metastases, stopped Decadron, - s/p PEG placement on 02/24/18, Speech pathologist recommends dysphagia 1 diet (pured) and nectar thickened liquids in addition to PEG tube feedings  2)Aggressive papillary thyroid CA- -history of left papillary thyroid carcinoma with paralysis of vocal cord -history of thyroidectomy and laryngoplasty about 2 years ago, -appears to have Lung mets based this scan -ENT and  John Sampson to d/w Radonc and Endocrine regarding options to Rx this  3)Leukocytosis-- down to 14,000,  suspect due to combination of recent Decadron use and underlying malignancy, patient completed IV Zosyn on 02/25/2018, he had 10 days total of antibiotics, no evidence of ongoing active infection at this   4)P.Afib - -in NSR, and HR controlled, continue therapeutic Lovenox ( patient was on Eliquis at home),  upon discharge consider discharging back on Eliquis,  continue metoprolol 50 twice daily, may restart Cardizem CD 240 in 1-2 days if rate is not controlled,   5)H/o Hypothyroidism-TSH is down to 0.024, free T4 is 1.2, prior to admission patient was on levothyroxine 125 mcg daily, which has been on hold now may restart levothyroxine at 75 mcg daily, recheck TSH in about 4 weeks  6)HFpEF-patient has history of chronic diastolic dysfunction CHF, last known EF of 55%, initially there was concern about volume overload and patient received IV Lasix, appears to be euvolemic at this time,  7)R leg abrasion/skin tear -no s/s of infection, [facility] consult appreciated  Awaiting placement of skilled nursing facility pending insurance approval  DVT prophylaxis: Lovenox Code Status: DNR Family Communication: no family at bedside Disposition Plan: SNF    Consultants:   ENT  IR  Procedures:   MBS x 2   Lab Results  Component Value Date   PLT 235 02/27/2018    Inpatient Medications  Scheduled Meds: . enoxaparin (LOVENOX) injection  1 mg/kg Subcutaneous Q12H  . feeding supplement (PRO-STAT SUGAR FREE 64)  30 mL Per Tube BID  . free water  200 mL Per Tube QID  . levothyroxine  75 mcg Oral QAC breakfast  . mouth rinse  15 mL Mouth Rinse BID  . metoprolol tartrate  50 mg Oral BID  .  morphine injection  1 mg Intravenous Once  . mupirocin ointment   Topical Daily   Continuous Infusions: . feeding supplement (JEVITY 1.2 CAL) 1,000 mL (02/27/18 1605)   PRN Meds:.acetaminophen **OR** acetaminophen, albuterol, hydrALAZINE, HYDROmorphone (DILAUDID) injection, ondansetron **OR** ondansetron (ZOFRAN) IV, RESOURCE THICKENUP CLEAR, sodium chloride flush, white petrolatum    Anti-infectives (From admission, onward)   Start     Dose/Rate Route Frequency Ordered Stop   02/22/18 1845  piperacillin-tazobactam (ZOSYN) IVPB 3.375 g     3.375 g 12.5 mL/hr over 240 Minutes Intravenous Every 8 hours  02/22/18 1835 02/25/18 2152   02/20/18 1400  piperacillin-tazobactam (ZOSYN) IVPB 3.375 g  Status:  Discontinued     3.375 g 12.5 mL/hr over 240 Minutes Intravenous Every 8 hours 02/20/18 0903 02/22/18 1835   02/20/18 0915  piperacillin-tazobactam (ZOSYN) IVPB 3.375 g  Status:  Discontinued     3.375 g 12.5 mL/hr over 240 Minutes Intravenous Every 8 hours 02/20/18 0902 02/20/18 0903   02/15/18 1300  ampicillin-sulbactam (UNASYN) 1.5 g in sodium chloride 0.9 % 100 mL IVPB  Status:  Discontinued     1.5 g 200 mL/hr over 30 Minutes Intravenous Every 6 hours 02/15/18 1157 02/20/18 0855        Objective:   Vitals:   02/26/18 2205 02/27/18 0450 02/27/18 1249 02/27/18 1433  BP: (!) 128/52 (!) 158/49 (!) 155/54 (!) 134/54  Pulse: (!) 53 64 60 (!) 48  Resp: 16 16  15   Temp: 97.6 F (36.4 C) 98.4 F (36.9 C)  98.6 F (37 C)  TempSrc: Oral Oral  Oral  SpO2: 98% 95% 100% 96%  Weight:      Height:        Wt Readings from Last 3 Encounters:  02/25/18 85.4 kg (188 lb 4.4 oz)  02/10/18 88 kg (194 lb)  01/25/18 90.3 kg (199 lb)     Intake/Output Summary (Last 24 hours) at 02/27/2018 1924 Last data filed at 02/27/2018 1622 Gross per 24 hour  Intake 1237.25 ml  Output 1750 ml  Net -512.75 ml     Physical Exam  Gen:- Awake Alert,  In no apparent distress  HEENT:- [place].AT, No sclera icterus Neck-Supple Neck,No JVD,.  Lungs-  CTAB , good air entry CV- S1, S2 normal Abd-  +ve B.Sounds, Abd Soft,, PEG tube site is clean dry and intact Extremity/Skin:- No  edema,   good pulses Psych-affect is appropriate, oriented x3 Neuro-no new focal deficits, no tremors   Data Review:   Micro Results No results found for this or any previous visit (from the past 240 hour(s)).  Radiology Reports Ct Abdomen Wo Contrast  Result Date: 02/23/2018 CLINICAL DATA:  Dysphagia, aspiration risk, assess anatomy for gastrostomy insertion EXAM: CT ABDOMEN WITHOUT CONTRAST TECHNIQUE: Multidetector CT imaging  of the abdomen was performed following the standard protocol without IV contrast. COMPARISON:  02/22/2018 modified swallow FINDINGS: Lower chest: Several bilateral lower chest pulmonary nodules all measuring 5 mm or less in size within the lower lobes, inferior right middle lobe, and lingula Right basilar atelectasis versus scarring also noted. Normal heart size. Native coronary atherosclerosis. Previous median sternotomy evident. Feeding tube extends into the stomach distally. Hepatobiliary: Cholelithiasis evident. No focal hepatic abnormality. No biliary dilatation or obstruction. Pancreas: No focal abnormality or ductal dilatation. No surrounding inflammatory process. Spleen: Normal in size. Adrenals/Urinary Tract: Normal adrenal glands. Kidneys demonstrate no acute obstructive uropathy or hydronephrosis. Ureters are symmetric and decompressed. Mild chronic perinephric strandy edema. Indeterminate posterior left renal hyperdense cortical lesion measures 10 mm this may represent a proteinaceous or hemorrhagic cyst. Stomach/Bowel: Feeding tube enters the stomach. No significant hiatal hernia. Stomach anatomy appears favorable for fluoroscopic gastrostomy technique. Stomach is inferior to the left hepatic lobe and the midportion of the stomach is superior to the transverse colon. Feeding tube extends into the duodenal bulb. Stomach is currently decompressed. Vascular/Lymphatic: Aortic atherosclerosis noted. Negative for aneurysm. No retroperitoneal hemorrhage or hematoma. No abdominal adenopathy. Other: No abdominal wall hernia. No free fluid or ascites. Negative for abscess. Musculoskeletal: No acute osseous finding. Anterior L2 vertebral body lytic lesion without compression pathologic fracture. Appearance is suspicious for metastatic disease or myeloma. IMPRESSION: Stomach anatomy appears favorable for attempt at fluoroscopic gastrostomy insertion. Numerous subcentimeter bilateral pulmonary nodules suspicious for  pulmonary metastases. Lytic L2 vertebral body suspicious for osseous metastasis. Atherosclerosis without aneurysm Cholelithiasis Electronically Signed   By: Jerilynn Mages.  Shick M.D.   On: 02/23/2018 16:06   Dg Chest 2 View  Result Date: 02/18/2018 CLINICAL DATA:  Leukocytosis. EXAM: CHEST - 2 VIEW COMPARISON:  Chest x-ray dated December 06, 2017. FINDINGS: Feeding tube noted with the tip likely in the distal stomach. Stable cardiomegaly status post CABG. Normal pulmonary vascularity. Emphysema. No focal consolidation, pleural effusion, or pneumothorax. Unchanged elevation of the right hemidiaphragm. No acute osseous abnormality. IMPRESSION: 1. COPD.  No active cardiopulmonary disease. Electronically Signed   By: Titus Dubin M.D.   On: 02/18/2018 11:37   Ct Soft Tissue Neck W Contrast  Result Date: 02/22/2018 CLINICAL DATA:  Improving dysphagia. Status post laryngoscopy. History of thyroid cancer, thyroidectomy, laryngoplasty, pituitary macroadenoma. EXAM: CT NECK WITH CONTRAST TECHNIQUE: Multidetector CT imaging of the neck was performed using the standard protocol following the bolus administration of intravenous contrast. CONTRAST:  34mL ISOVUE-300 IOPAMIDOL (ISOVUE-300) INJECTION 61% COMPARISON:  CT neck August 25, 2016 FINDINGS: PHARYNX AND LARYNX: Focally sclerotic LEFT thyroid and cricoid cartilages consistent with tumoral involvement. Mild medial deviation RIGHT true vocal cord could reflect paralysis. Normal pharynx. SALIVARY GLANDS: Normal. THYROID: Status post thyroidectomy. 21 x 14 mm abnormal soft tissue LEFT posterior thyroid bed and appears to invade the anterior status soft [place]. Additional subcentimeter nodules inferior LEFT thyroid bed. LYMPH NODES: 10 mm round LEFT level 4 lymph node. 9 mm LEFT level 7 lymph node with additional smaller bilateral 7 lymph nodes. VASCULAR: Moderate calcific atherosclerosis aortic arch. Moderate calcific atherosclerosis carotid bifurcations. LIMITED  INTRACRANIAL: Normal. VISUALIZED ORBITS: Status post bilateral ocular lens implants. MASTOIDS AND VISUALIZED PARANASAL SINUSES: LEFT nasogastric tube. SKELETON: Nonacute.  Patient is edentulous. UPPER CHEST: Centrilobular emphysema. Multiple LEFT pulmonary nodules measuring to at least 11 mm, some of which are new from prior CT. Moderate centrilobular emphysema. Status post median sternotomy. OTHER: None. IMPRESSION: 1. Status post thyroidectomy with 21 x 14 mm mass LEFT thyroid bed consistent with invasive residual versus recurrent tumor (including suspected esophageal invasion). 2. New pathologic level for lymphadenopathy with probable additional smaller alignment lymph nodes LEFT level 7. 3. Multiple pulmonary nodules, some which are new from prior CT most compatible with progressive metastasis. Aortic Atherosclerosis (ICD10-I70.0) and Emphysema (ICD10-J43.9). Electronically Signed   By: Elon Alas M.D.   On: 02/22/2018 21:14   Ir Gastrostomy Tube Mod Sed  Result Date: 02/24/2018 INDICATION: Thyroid cancer, dysphagia, malnutrition EXAM: FLUOROSCOPIC 20 FRENCH PULL-THROUGH GASTROSTOMY Date:  02/24/2018 02/24/2018 1:43 pm Radiologist:  Jerilynn Mages. Daryll Brod, MD Guidance:  Fluoroscopic MEDICATIONS: 3.375 g Zosyn; Antibiotics were administered within 1 hour of the procedure. Glucagon 0.5 mg IV ANESTHESIA/SEDATION: Versed 1.5 mg IV; Fentanyl 75 mcg IV Moderate Sedation Time:  11 minutes The patient was continuously monitored during the procedure by the interventional radiology nurse under my direct supervision. CONTRAST:  10 cc Isovue-administered into the gastric lumen. FLUOROSCOPY TIME:  Fluoroscopy Time: 1 minutes 18 seconds (12 mGy). COMPLICATIONS: None immediate. PROCEDURE: Informed consent was obtained from the patient following explanation of the procedure, risks, benefits and alternatives. The patient understands, agrees and consents for the procedure. All questions were addressed. A time out was performed.  Maximal barrier sterile technique utilized including caps, mask, sterile gowns, sterile gloves, large sterile drape, hand hygiene, and betadine prep. The left upper quadrant was sterilely prepped and draped. An oral gastric catheter was inserted into the stomach under fluoroscopy. The existing nasogastric feeding tube was removed. Air was injected into the stomach for insufflation and visualization under fluoroscopy. The air distended stomach was confirmed beneath the anterior abdominal wall in the frontal and lateral projections. Under sterile conditions and local anesthesia, a 21 gauge trocar needle was utilized to access the stomach percutaneously beneath the left subcostal margin. Needle position was confirmed within the stomach under biplane fluoroscopy. Contrast injection confirmed position also. A single T tack was deployed for gastropexy. Over an Amplatz guide wire, a 9-French sheath was inserted into the stomach. A snare device was utilized to capture the oral gastric catheter. The snare device was pulled retrograde from the stomach up the esophagus and out the oropharynx. The 20-French pull-through gastrostomy was connected to the snare device and pulled antegrade through the oropharynx down the esophagus into the stomach and then through the percutaneous tract external to the patient. The gastrostomy was assembled externally. Contrast injection confirms position in the stomach. Images were obtained for documentation. The patient tolerated procedure well. No immediate complication. IMPRESSION:  Fluoroscopic insertion of a 20-French "pull-through" gastrostomy. Electronically Signed   By: Jerilynn Mages.  Shick M.D.   On: 02/24/2018 13:45   Dg Chest Port 1 View  Result Date: 02/22/2018 CLINICAL DATA:  Dyspnea. History of COPD, previous MI, atrial flutter, chronic CHF. Former smoker. EXAM: PORTABLE CHEST 1 VIEW COMPARISON:  PA and lateral chest x-ray of February 18, 2018. FINDINGS: The lungs are adequately inflated and  clear. The heart and pulmonary vascularity are normal. The mediastinum is normal in width. There are post CABG changes. There is calcification in the wall of the aortic arch. IMPRESSION: Previous CABG. No pneumonia, CHF, nor other acute cardiopulmonary abnormality. Electronically Signed   By: David  Martinique M.D.   On: 02/22/2018 14:22   Dg Addison Bailey G Tube Plc W/fl W/rad  Result Date: 02/15/2018 CLINICAL DATA:  Unable to swallow.  Need for feeding tube EXAM: NASO G TUBE PLACEMENT WITH FL AND WITH RAD CONTRAST:  10 mL Isovue contrast injected to the feeding tube. FLUOROSCOPY TIME:  Fluoroscopy Time:  8 minutes 4 seconds Radiation Exposure Index (if provided by the fluoroscopic device): Number of Acquired Spot Images: 0 COMPARISON:  Speech pathology study 02/15/2018 FINDINGS: The technologist initially attempted to place the tube, and had difficulty passing the tube through the pharynx into the esophagus. My presence was requested. After some effort, the tube was advanced into the stomach using a Glidewire. The tube was placed in the antrum however I was not able to get the tube into the duodenum despite multiple attempts with the tube and wire. Tube was left in the gastric antrum IMPRESSION: Feeding tube placed with the tip in the gastric antrum. I was not able to get the tube into the duodenum. Electronically Signed   By: Franchot Gallo M.D.   On: 02/15/2018 14:00   Dg Swallowing Func-speech Pathology  Result Date: 02/19/2018 Objective Swallowing Evaluation: Type of Study: MBS-Modified Barium Swallow Study  Patient Details Name: TOBECHUKWU EMMICK MRN: [id number] Date of Birth: [birth year] Today's Date: 02/19/2018 Time: SLP Start Time (ACUTE ONLY): 1215 -SLP Stop Time (ACUTE ONLY): 1240 SLP Time Calculation (min) (ACUTE ONLY): 25 min Past Medical History: Past Medical History: Diagnosis Date . Atrial flutter ([place])   on Eliquis . AV malformation of GI tract  . COPD (chronic obstructive pulmonary disease) ([place]) 3.12.14  2D  Echo, EF 50-55% . Coronary atherosclerosis of native coronary artery   Multivessel status post CABG . Gastric ulcer   Small - nonbleeding . GERD (gastroesophageal reflux disease)  . Headache(784.0)  . Hypothyroidism  . Iron deficiency anemia   Negative Givens capsule study  . Myocardial infarction ([place])  . PAD (peripheral artery disease) (HCC)   Moderate bilateral SFA disease at angiography 01/2013 . Pituitary macroadenoma ([place])  . Thyroid cancer ([place])  . Vocal cord paralysis   left Past Surgical History: Past Surgical History: Procedure Laterality Date . ABDOMINAL AORTAGRAM N/A 02/19/2013  Procedure: ABDOMINAL Maxcine Ham;  Surgeon: [name], MD;  Location: [facility] CATH LAB;  Service: Cardiovascular;  Laterality: N/A; . CARDIAC CATHETERIZATION   . CARDIOVERSION N/A 06/16/2015  Procedure: CARDIOVERSION;  Surgeon: Satira Sark, MD;  Location: [facility] ORS;  Service: Cardiovascular;  Laterality: N/A; . COLONOSCOPY  08/18/2012  Procedure: COLONOSCOPY;  Surgeon: [name], MD;  [facility] ENDO SU[facility];  Service: Endoscopy;  Laterality: N/A;  1:25 . COLONOSCOPY N/A 09/12/2017  Procedure: COLONOSCOPY;  Surgeon: [name], MD;  [facility] END[facility];  Service: Endoscopy;  Laterality: N/A; . CORONARY ARTERY  BYPASS GRAFT  2003  5 grafts - details not clear . CRANIOTOMY  12/31/2011  Procedure: CRANIOTOMY HYPOPHYSECTOMY TRANSNASAL APPROACH;  Surgeon: [name], MD;  Location: [place] NEURO ORS;  Service: Neurosurgery;  Laterality: N/A;  Transphenoidal Hypophysectomy With Fat Graft Harvest from right abdomen  . ESOPHAGOGASTRODUODENOSCOPY  08/18/2012  Procedure: ESOPHAGOGASTRODUODENOSCOPY (EGD);  Surgeon: [name], MD;  [facility];  Service: Endoscopy;  Laterality: N/A; . ESOPHAGOGASTRODUODENOSCOPY N/A 03/31/2017  Procedure: ESOPHAGOGASTRODUODENOSCOPY (EGD);  Surgeon: [name], MD;  [facility];  Service: Endoscopy;  Laterality: N/A; . ESOPHAGOGASTRODUODENOSCOPY N/A 09/12/2017   Procedure: ESOPHAGOGASTRODUODENOSCOPY (EGD);  Surgeon: [name], MD;  [facility];  Service: Endoscopy;  Laterality: N/A;  10:40 . EYE SURGERY  2012 . GIVENS CAPSULE STUDY N/A 01/25/2013  Procedure: GIVENS CAPSULE STUDY;  Surgeon: [name], MD;  [facility];  Service: Endoscopy;  Laterality: N/A;  730 . HOT HEMOSTASIS  03/31/2017  Procedure: HOT HEMOSTASIS (ARGON PLASMA COAGULATION/BICAP);  Surgeon: [name], MD;  [facility];  Service: Endoscopy;;  duodenum . LARYNGOPLASTY Left 09/14/2016  Procedure: LARYNGOPLASTY;  Surgeon: [name], MD;  Location: [place];  Service: ENT;  Laterality: Left; . LOWER EXTREMITY ANGIOGRAM N/A 02/19/2013  Procedure: LOWER EXTREMITY ANGIOGRAM;  Surgeon: [name], MD;  Location: [facility] CATH LAB;  Service: Cardiovascular;  Laterality: N/A; . MICROLARYNGOSCOPY N/A 02/10/2018  Procedure: MICROLARYNGOSCOPY WITH VOCAL CORD INJECTION;  Surgeon: [name], MD;  Location: [place];  Service: ENT;  Laterality: N/A; . PERIPHERAL VASCULAR CATHETERIZATION N/A 01/19/2016  Procedure: Abdominal Aortogram;  Surgeon: Conrad Anaheim, MD;  Location: [place] CV LAB;  Service: Cardiovascular;  Laterality: N/A; . PERIPHERAL VASCULAR CATHETERIZATION Bilateral 01/19/2016  Procedure: Lower Extremity Angiography;  Surgeon: Conrad Rio Lucio, MD;  Location: [place] CV LAB;  Service: Cardiovascular;  Laterality: Bilateral; . PV Angiogram  02/19/13  Indications: slow healing left calf ulcer . Stress Myocardial Perfusion  12/08/2011  Indications: Abnormal EKG, Eval of prior GABG . TEE WITHOUT CARDIOVERSION N/A 06/16/2015  Procedure: TRANSESOPHAGEAL ECHOCARDIOGRAM (TEE);  Surgeon: Satira Sark, MD;  [facility];  Service: Cardiovascular;  Laterality: N/A; . THYROIDECTOMY N/A 09/14/2016  Procedure: TOTAL THYROIDECTOMY;  Surgeon: [name], MD;  Location: [place];  Service: ENT;  Laterality: N/A; .  TONSILLECTOMY  Age 24 HPI: John Penna Cookeis a 82 y.o.malewith medical history significant ofleft papillary thyroid carcinoma with paralysis of left vocal cord. Also with total thyroidectomy and laryngoplasty. He presented to [facility] increasing SOB and hoarseness. He was taken to the OR on 3/15 by John Sampson for injection of left vocal cord to strengthen his voice. He presented today to John Sampson office and there was concern for dehydration and dysphagia so he was sent to [facility] for direct admission (actually observation). Since the procedure, he has been able to have any solid or liquid food - it just won't go down or it goes down and comes right back up. Also unable to swallow any pills. He had the procedure Friday and did fine. He was told to go home with a soft diet and he was able to eat and drink that night. He ate breakfast ok Saturday but he wasn't able to eat after that. Dr. Shirlee More that on in-office exam the vocal cords are clear without swelling or infection but the patient was unable to swallow water in his office. He recommends IVF, labs, modified barium swallow  evaluationfor esophageal obstruction/aspiration.  Subjective: Alert, cooperative Assessment / Plan / Recommendation CHL IP CLINICAL IMPRESSIONS 02/19/2018 Clinical Impression Patient presents with improving, more moderate cervical esophageal dysphagia due to obstructing tissue at C5/6 and C7. Pt is able to pass approximately ~50% bolus through the cervical esophagus in head neutral position, however there is penetration of backflow after the swallow (worse with liquids) with all consistencies, and there is sensed aspiration with thin liquids. Compensatory manuevers attempted, and most effective was head turn left with puree and honey-thick liquids and multiple dry swallows to decrease residue. Pt then able to hock to expectorate mild residue that remained in the vallecule. Head turn did not prevent penetration of backflow of  thin and nectar thick liquids from the cervical esophagus. Chin tuck was not effective. Pt educated in real time using video feedback re: use of strategies and frequent oral care to reduce aspiration risk. Recommend inititating dys 1 (puree) and honey thick liquids with head turn left and multiple swallows per bite, cough or hock to expectorate periodically. As consuming POs will be a lengthy and laborious process due to number of swallows required, would consider continuation of cortrak for now as pt may have difficulty maintaining adequate nutrition/hydration with POs alone. Pt encouraged to complete thorough oral care before and after meals.  SLP Visit Diagnosis Dysphagia, pharyngoesophageal phase (R13.14) Attention and concentration deficit following -- Frontal lobe and executive function deficit following -- Impact on safety and function Moderate aspiration risk   CHL IP TREATMENT RECOMMENDATION 02/19/2018 Treatment Recommendations Therapy as outlined in treatment plan below   Prognosis 02/19/2018 Prognosis for Safe Diet Advancement Good Barriers to Reach Goals -- Barriers/Prognosis Comment -- CHL IP DIET RECOMMENDATION 02/19/2018 SLP Diet Recommendations Dysphagia 1 (Puree) solids;Honey thick liquids;Alternative means - temporary Liquid Administration via Cup;Spoon Medication Administration Via alternative means Compensations Multiple dry swallows after each bite/sip;Effortful swallow;Other (Comment) Postural Changes Seated upright at 90 degrees   CHL IP OTHER RECOMMENDATIONS 02/19/2018 Recommended Consults -- Oral Care Recommendations Oral care before and after PO Other Recommendations Order thickener from pharmacy;Prohibited food (jello, ice cream, thin soups);Remove water pitcher;Have oral suction available   CHL IP FOLLOW UP RECOMMENDATIONS 02/19/2018 Follow up Recommendations Other (comment)   CHL IP FREQUENCY AND DURATION 02/19/2018 Speech Therapy Frequency (ACUTE ONLY) min 2x/week Treatment Duration 2 weeks       CHL IP ORAL PHASE 02/19/2018 Oral Phase WFL Oral - Pudding Teaspoon -- Oral - Pudding Cup -- Oral - Honey Teaspoon -- Oral - Honey Cup -- Oral - Nectar Teaspoon -- Oral - Nectar Cup -- Oral - Nectar Straw -- Oral - Thin Teaspoon -- Oral - Thin Cup -- Oral - Thin Straw -- Oral - Puree -- Oral - Mech Soft -- Oral - Regular -- Oral - Multi-Consistency -- Oral - Pill -- Oral Phase - Comment --  CHL IP PHARYNGEAL PHASE 02/19/2018 Pharyngeal Phase WFL Pharyngeal- Pudding Teaspoon -- Pharyngeal -- Pharyngeal- Pudding Cup -- Pharyngeal -- Pharyngeal- Honey Teaspoon -- Pharyngeal -- Pharyngeal- Honey Cup -- Pharyngeal -- Pharyngeal- Nectar Teaspoon -- Pharyngeal -- Pharyngeal- Nectar Cup -- Pharyngeal -- Pharyngeal- Nectar Straw -- Pharyngeal -- Pharyngeal- Thin Teaspoon -- Pharyngeal -- Pharyngeal- Thin Cup -- Pharyngeal -- Pharyngeal- Thin Straw -- Pharyngeal -- Pharyngeal- Puree -- Pharyngeal -- Pharyngeal- Mechanical Soft -- Pharyngeal -- Pharyngeal- Regular -- Pharyngeal -- Pharyngeal- Multi-consistency -- Pharyngeal -- Pharyngeal- Pill -- Pharyngeal -- Pharyngeal Comment --  CHL IP CERVICAL ESOPHAGEAL PHASE 02/19/2018 Cervical Esophageal Phase Impaired Pudding Teaspoon --  Pudding Cup -- Honey Teaspoon Reduced cricopharyngeal relaxation;Esophageal backflow into the pharynx;Esophageal backflow into the larynx Honey Cup Reduced cricopharyngeal relaxation;Esophageal backflow into the pharynx;Esophageal backflow into the larynx Nectar Teaspoon -- Nectar Cup Reduced cricopharyngeal relaxation;Esophageal backflow into the pharynx;Esophageal backflow into the larynx Nectar Straw -- Thin Teaspoon Reduced cricopharyngeal relaxation;Esophageal backflow into the pharynx;Esophageal backflow into the larynx Thin Cup Reduced cricopharyngeal relaxation;Esophageal backflow into the pharynx;Esophageal backflow into the larynx Thin Straw NT Puree Reduced cricopharyngeal relaxation;Esophageal backflow into the pharynx;Esophageal backflow  into the larynx Mechanical Soft -- Regular -- Multi-consistency -- Pill -- Cervical Esophageal Comment head turn left prevents penetration with honey thick liquids, puree only Deneise Lever, MS, [place] [phone] No flowsheet data found. Aliene Altes 02/19/2018, 1:58 PM              Dg Swallowing Func-speech Pathology  Result Date: 02/15/2018 Objective Swallowing Evaluation: Type of Study: MBS-Modified Barium Swallow Study  Patient Details Name: JAVELLE DONIGAN MRN: [id number] Date of Birth: [birth year] Today's Date: 02/15/2018 Time: SLP Start Time (ACUTE ONLY): 1000 -SLP Stop Time (ACUTE ONLY): 1027 SLP Time Calculation (min) (ACUTE ONLY): 27 min Past Medical History: Past Medical History: Diagnosis Date . Atrial flutter ([place])   on Eliquis . AV malformation of GI tract  . COPD (chronic obstructive pulmonary disease) ([place]) 3.12.14  2D Echo, EF 50-55% . Coronary atherosclerosis of native coronary artery   Multivessel status post CABG . Gastric ulcer   Small - nonbleeding . GERD (gastroesophageal reflux disease)  . Headache(784.0)  . Hypothyroidism  . Iron deficiency anemia   Negative Givens capsule study  . Myocardial infarction ([place])  . PAD (peripheral artery disease) ([facility])   Moderate bilateral SFA disease at angiography 01/2013 . Pituitary macroadenoma ([place])  . Thyroid cancer ([place])  . Vocal cord paralysis   left Past Surgical History: Past Surgical History: Procedure Laterality Date . ABDOMINAL AORTAGRAM N/A 02/19/2013  Procedure: ABDOMINAL Maxcine Ham;  Surgeon: [name], MD;  Location: [facility] CATH LAB;  Service: Cardiovascular;  Laterality: N/A; . CARDIAC CATHETERIZATION   . CARDIOVERSION N/A 06/16/2015  Procedure: CARDIOVERSION;  Surgeon: Satira Sark, MD;  Location: [facility] ORS;  Service: Cardiovascular;  Laterality: N/A; . COLONOSCOPY  08/18/2012  Procedure: COLONOSCOPY;  Surgeon: [name], MD;  Location: [facility] ENDO SUITE;  Service: Endoscopy;  Laterality: N/A;  1:25 . COLONOSCOPY  N/A 09/12/2017  Procedure: COLONOSCOPY;  Surgeon: [name], MD;  [facility];  Service: Endoscopy;  Laterality: N/A; . CORONARY ARTERY BYPASS GRAFT  2003  5 grafts - details not clear . CRANIOTOMY  12/31/2011  Procedure: CRANIOTOMY HYPOPHYSECTOMY TRANSNASAL APPROACH;  Surgeon: [name], MD;  Location: [place] NEURO ORS;  Service: Neurosurgery;  Laterality: N/A;  Transphenoidal Hypophysectomy With Fat Graft Harvest from right abdomen  . ESOPHAGOGASTRODUODENOSCOPY  08/18/2012  Procedure: ESOPHAGOGASTRODUODENOSCOPY (EGD);  Surgeon: [name], MD;  [facility];  Service: Endoscopy;  Laterality: N/A; . ESOPHAGOGASTRODUODENOSCOPY N/A 03/31/2017  Procedure: ESOPHAGOGASTRODUODENOSCOPY (EGD);  Surgeon: [name], MD;  [facility];  Service: Endoscopy;  Laterality: N/A; . ESOPHAGOGASTRODUODENOSCOPY N/A 09/12/2017  Procedure: ESOPHAGOGASTRODUODENOSCOPY (EGD);  Surgeon: [name], MD;  [facility];  Service: Endoscopy;  Laterality: N/A;  10:40 . EYE SURGERY  2012 . GIVENS CAPSULE STUDY N/A 01/25/2013  Procedure: GIVENS CAPSULE STUDY;  Surgeon: [name], MD;  [facility];  Service: Endoscopy;  Laterality: N/A;  730 . HOT HEMOSTASIS  03/31/2017  Procedure: HOT HEMOSTASIS (ARGON PLASMA COAGULATION/BICAP);  Surgeon: [name], MD;  [facility];  Service: Endoscopy;;  duodenum . LARYNGOPLASTY Left 09/14/2016  Procedure: LARYNGOPLASTY;  Surgeon: [name], MD;  Location: [place];  Service: ENT;  Laterality: Left; . LOWER EXTREMITY ANGIOGRAM N/A 02/19/2013  Procedure: LOWER EXTREMITY ANGIOGRAM;  Surgeon: [name], MD;  Location: [facility] CATH LAB;  Service: Cardiovascular;  Laterality: N/A; . MICROLARYNGOSCOPY N/A 02/10/2018  Procedure: MICROLARYNGOSCOPY WITH VOCAL CORD INJECTION;  Surgeon: [name], MD;  Location: [place];  Service: ENT;  Laterality: N/A; . PERIPHERAL VASCULAR CATHETERIZATION  N/A 01/19/2016  Procedure: Abdominal Aortogram;  Surgeon: Conrad Orchard, MD;  Location: [place] CV LAB;  Service: Cardiovascular;  Laterality: N/A; . PERIPHERAL VASCULAR CATHETERIZATION Bilateral 01/19/2016  Procedure: Lower Extremity Angiography;  Surgeon: Conrad Vanceboro, MD;  Location: [place] CV LAB;  Service: Cardiovascular;  Laterality: Bilateral; . PV Angiogram  02/19/13  Indications: slow healing left calf ulcer . Stress Myocardial Perfusion  12/08/2011  Indications: Abnormal EKG, Eval of prior GABG . TEE WITHOUT CARDIOVERSION N/A 06/16/2015  Procedure: TRANSESOPHAGEAL ECHOCARDIOGRAM (TEE);  Surgeon: Satira Sark, MD;  Location[facility];  Service: Cardiovascular;  Laterality: N/A; . THYROIDECTOMY N/A 09/14/2016  Procedure: TOTAL THYROIDECTOMY;  Surgeon: [name], MD;  Location: [place];  Service: ENT;  Laterality: N/A; . 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 Also with total thyroidectomy and laryngoplasty. He presented to [facility] increasing SOB and hoarseness. He was taken to the OR on 3/15 by John Sampson for injection of left vocal cord to strengthen his voice. He presented today to John Sampson office and there was concern for dehydration and dysphagia so he was sent to [facility] for direct admission (actually observation). Since the procedure, he has been able to have any solid or liquid food - it just won't go down or it goes down and comes right back up. Also unable to swallow any pills. He had the procedure Friday and did fine. He was told to go home with a soft diet and he was able to eat and drink that night. He ate breakfast ok Saturday but he wasn't able to eat after that. Dr. Shirlee More that on in-office exam the vocal cords are clear without swelling or infection but the patient was unable to swallow water in his office. He recommends IVF, labs, modified  barium swallow evaluationfor esophageal obstruction/aspiration.  No Data Recorded Assessment / Plan / Recommendation CHL IP CLINICAL IMPRESSIONS 02/15/2018 Clinical Impression Pt demosntrates a severe cervical esophageal dysphagia with obstructing tissue at C5/6 and C7. Pt is unable to pass 1/4 teaspoon of thin liquids despite maximal effortful swallow or a head turn R or L. Chin tuck also ineffective. Attempted large bolus to adress if heavier propulsion could force more opening without success. 90% or more of the bolus returned to the pharynx and was aspriated with sensation. Pt does have a hard cough ability and can expectorate residue. Recommend pt remain fully NPO. Discussed result with MD, will need ENT to review study images and address needs. Pt may need a temporary means of nutrition, though passage of any NG would likely be difficult even under fluoroscopy and could be contraindicated. Esophagram discontinued due to pt inability to consume any quantity of barium for further esophageal assessment.  SLP Visit Diagnosis Dysphagia, pharyngoesophageal phase (R13.14) Attention and  concentration deficit following -- Frontal lobe and executive function deficit following -- Impact on safety and function Severe aspiration risk;Risk for inadequate nutrition/hydration   CHL IP TREATMENT RECOMMENDATION 02/15/2018 Treatment Recommendations Therapy as outlined in treatment plan below   Prognosis 02/15/2018 Prognosis for Safe Diet Advancement Good Barriers to Reach Goals -- Barriers/Prognosis Comment -- CHL IP DIET RECOMMENDATION 02/15/2018 SLP Diet Recommendations NPO;Alternative means - temporary Liquid Administration via -- Medication Administration Via alternative means Compensations -- Postural Changes --   CHL IP OTHER RECOMMENDATIONS 02/15/2018 Recommended Consults Consider ENT evaluation Oral Care Recommendations -- Other Recommendations --   CHL IP FOLLOW UP RECOMMENDATIONS 02/15/2018 Follow up Recommendations 24 hour  supervision/assistance   CHL IP FREQUENCY AND DURATION 02/15/2018 Speech Therapy Frequency (ACUTE ONLY) min 2x/week Treatment Duration 2 weeks      CHL IP ORAL PHASE 02/15/2018 Oral Phase WFL Oral - Pudding Teaspoon -- Oral - Pudding Cup -- Oral - Honey Teaspoon -- Oral - Honey Cup -- Oral - Nectar Teaspoon -- Oral - Nectar Cup -- Oral - Nectar Straw -- Oral - Thin Teaspoon -- Oral - Thin Cup -- Oral - Thin Straw -- Oral - Puree -- Oral - Mech Soft -- Oral - Regular -- Oral - Multi-Consistency -- Oral - Pill -- Oral Phase - Comment --  CHL IP PHARYNGEAL PHASE 02/15/2018 Pharyngeal Phase WFL Pharyngeal- Pudding Teaspoon -- Pharyngeal -- Pharyngeal- Pudding Cup -- Pharyngeal -- Pharyngeal- Honey Teaspoon -- Pharyngeal -- Pharyngeal- Honey Cup -- Pharyngeal -- Pharyngeal- Nectar Teaspoon -- Pharyngeal -- Pharyngeal- Nectar Cup -- Pharyngeal -- Pharyngeal- Nectar Straw -- Pharyngeal -- Pharyngeal- Thin Teaspoon -- Pharyngeal -- Pharyngeal- Thin Cup -- Pharyngeal -- Pharyngeal- Thin Straw -- Pharyngeal -- Pharyngeal- Puree -- Pharyngeal -- Pharyngeal- Mechanical Soft -- Pharyngeal -- Pharyngeal- Regular -- Pharyngeal -- Pharyngeal- Multi-consistency -- Pharyngeal -- Pharyngeal- Pill -- Pharyngeal -- Pharyngeal Comment --  CHL IP CERVICAL ESOPHAGEAL PHASE 02/15/2018 Cervical Esophageal Phase Impaired Pudding Teaspoon -- Pudding Cup -- Honey Teaspoon -- Honey Cup -- Nectar Teaspoon -- Nectar Cup -- Nectar Straw -- Thin Teaspoon Reduced cricopharyngeal relaxation;Esophageal backflow into the pharynx;Esophageal backflow into the larynx Thin Cup -- Thin Straw Reduced cricopharyngeal relaxation;Esophageal backflow into the pharynx;Esophageal backflow into the larynx Puree -- Mechanical Soft -- Regular -- Multi-consistency -- Pill -- Cervical Esophageal Comment -- No flowsheet data found. DeBlois, Katherene Ponto 02/15/2018, 10:41 AM                CBC Recent Labs  Lab 02/21/18 1037 02/22/18 0425 02/23/18 0406 02/25/18 0707  02/27/18 0451  WBC 20.4* 22.8* 24.6* 14.6* 13.1*  HGB 12.0* 11.8* 12.0* 11.7* 10.8*  HCT 39.1 39.4 38.9* 38.3* 34.9*  PLT 274 263 254 219 235  MCV 85.6 84.7 83.3 85.1 84.7  MCH 26.3 25.4* 25.7* 26.0 26.2  MCHC 30.7 29.9* 30.8 30.5 30.9  RDW 20.9* 20.7* 20.5* 20.6* 20.6*   Chemistries  Recent Labs  Lab 02/22/18 0425 02/23/18 0406 02/25/18 0707 02/27/18 0451  NA 134* 134* 134* 134*  K 5.1 3.8 4.2 3.8  CL 96* 96* 102 100*  CO2 30 28 25 28   GLUCOSE 149* 130* 98 123*  BUN 48* 43* 24* 23*  CREATININE 1.03 0.98 0.97 0.83  CALCIUM 8.7* 8.7* 8.5* 8.8*   ------------------------------------------------------------------------------------------------------------------ No results for input(s): CHOL, HDL, LDLCALC, TRIG, CHOLHDL, LDLDIRECT in the last 72 hours.  Lab Results  Component Value Date   HGBA1C 5.8 (H) 08/14/2013  ------------------------------------------------------------------------------------------------------------------ No results for input(s): TSH, T4TOTAL, T3FREE,  THYROIDAB in the last 72 hours.  Invalid input(s): FREET3 ------------------------------------------------------------------------------------------------------------------ No results for input(s): VITAMINB12, FOLATE, FERRITIN, TIBC, IRON, RETICCTPCT in the last 72 hours.  Coagulation profile Recent Labs  Lab 02/23/18 2053  INR 1.00   No results for input(s): DDIMER in the last 72 hours.  Cardiac Enzymes No results for input(s): CKMB, TROPONINI, MYOGLOBIN in the last 168 hours.  Invalid input(s): CK ------------------------------------------------------------------------------------------------------------------    Component Value Date/Time   BNP 305.0 (H) 12/06/2017 1232   Roxan Hockey M.D on 02/27/2018 at 7:24 PM  Between 7am to 7pm - Pager - [phone]  After 7pm go to [email or web address] - password [email or web address]  [facility] -  Office  [phone]  Voice Recognition Viviann Spare dictation  system was used to create this note, attempts have been made to correct errors. Please contact the author with questions and/or clarifications.

## 2018-02-27 NOTE — Progress Notes (Signed)
  Speech Language Pathology Treatment: Dysphagia  Patient Details Name: PRAJWAL FELLNER MRN: [id number] DOB: 03-16-1936 Today's Date: 02/27/2018 Time: 1216-1225 SLP Time Calculation (min) (ACUTE ONLY): 9 min  Assessment / Plan / Recommendation Clinical Impression  Skilled treatment session focused on dysphagia. Upon entering the room, pt stated that he needed to turn head to left when taking sips of nectar thick liquids. Pt able to demonstrate however he coughed and expectorated bolus into suction. Pt's pharyngeal abilities don't appear improved and is uncomfortable, pt planning on quality intake with substantial nutritional intake thru PEG.    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 Also with total thyroidectomy and laryngoplasty. He presented to [facility] increasing SOB and hoarseness. He was taken to the OR on 3/15 by Dr. Lucia Gaskins for injection of left vocal cord to strengthen his voice. He presented today to Dr. Pollie Friar office and there was concern for dehydration and dysphagia so he was sent to [facility] for direct admission (actually observation). Since the procedure, he has been able to have any solid or liquid food - it just won't go down or it goes down and comes right back up. Also unable to swallow any pills. He had the procedure Friday and did fine. He was told to go home with a soft diet and he was able to eat and drink that night. He ate breakfast ok Saturday but he wasn't able to eat after that. Dr. Shirlee More that on in-office exam the vocal cords are clear without swelling or infection but the patient was unable to swallow water in his office. He recommends IVF, labs, modified barium swallow evaluationfor esophageal obstruction/aspiration.      SLP Plan  Continue with current plan of care       Recommendations  Diet recommendations: Dysphagia 1 (puree);Nectar-thick  liquid Liquids provided via: Cup;No straw Medication Administration: Crushed with puree Supervision: Patient able to self feed;Intermittent supervision to cue for compensatory strategies Compensations: Multiple dry swallows after each bite/sip;Effortful swallow;Other (Comment);Clear throat intermittently Postural Changes and/or Swallow Maneuvers: Head turn left during swallow;Seated upright 90 degrees                Oral Care Recommendations: Oral care BID Follow up Recommendations: (TBD) SLP Visit Diagnosis: Dysphagia, pharyngoesophageal phase (R13.14) Plan: Continue with current plan of care       GO                Ross Bender 02/27/2018, 1:56 PM

## 2018-02-28 LAB — CBC
HCT: 34.2 % — ABNORMAL LOW (ref 39.0–52.0)
Hemoglobin: 10.1 g/dL — ABNORMAL LOW (ref 13.0–17.0)
MCH: 25.4 pg — ABNORMAL LOW (ref 26.0–34.0)
MCHC: 29.5 g/dL — ABNORMAL LOW (ref 30.0–36.0)
MCV: 85.9 fL (ref 78.0–100.0)
Platelets: 243 10*3/uL (ref 150–400)
RBC: 3.98 MIL/uL — AB (ref 4.22–5.81)
RDW: 20.5 % — ABNORMAL HIGH (ref 11.5–15.5)
WBC: 12.9 10*3/uL — AB (ref 4.0–10.5)

## 2018-02-28 LAB — GLUCOSE, CAPILLARY
GLUCOSE-CAPILLARY: 101 mg/dL — AB (ref 65–99)
GLUCOSE-CAPILLARY: 119 mg/dL — AB (ref 65–99)
GLUCOSE-CAPILLARY: 99 mg/dL (ref 65–99)
Glucose-Capillary: 128 mg/dL — ABNORMAL HIGH (ref 65–99)
Glucose-Capillary: 101 mg/dL — ABNORMAL HIGH (ref 65–99)
Glucose-Capillary: 107 mg/dL — ABNORMAL HIGH (ref 65–99)

## 2018-02-28 NOTE — Progress Notes (Signed)
Nutrition Follow-up  DOCUMENTATION CODES:   Severe malnutrition in context of acute illness/injury  INTERVENTION:   -Continue Jevity 1.2 @ 61m/hr via PEG  Continue 30 ml Prostat BID.    Continue 200 ml free water flush QID  Tube feeding regimen provides 2072 kcal (100% of needs), 117 grams of protein, and 1264 ml of H2O. (Total free water: 2064 ml)  NUTRITION DIAGNOSIS:   Severe Malnutrition related to acute illness(recent dysphagia) as evidenced by moderate muscle depletion, percent weight loss, energy intake < or equal to 50% for > or equal to 5 days(5.5% wt loss in one month).  Ongoing  GOAL:   Patient will meet greater than or equal to 90% of their needs  Met with TF  MONITOR:   PO intake, Diet advancement, Labs, Weight trends, TF tolerance, Skin, I & O's  REASON FOR ASSESSMENT:   Consult Enteral/tube feeding initiation and management(S/P PEG)  ASSESSMENT:   82y.o. 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 S/p vocal cord injection in OR on 3/15. Admitted from ENT with concern for dehydration and dysphagia. Pt with difficulty swallowing.  3/20- NGT placed under fluoroscopy, TF initiated 3/24- s/p MBSS- advanced to dysphagia 1 diet with honey thick liquids 3/27- s/p Repeat MBS- diet advanced to D1/NTL 3/28 CT shows extensive recurrence of thyroid cancer adjacent to trachea as well as into anterior esophageal wall and probable lung mets.  3/29 PEG placed  Pt sleeping soundly in recliner chair at time of visit.   Pt remains on a dysphagia 1 det with nectar thickened liquids. Intake remains minimal (PO: 0-10%). Per SLP, anticipate pt will continue to receive the majority of his nutrition via PEG.   Patient continues to receive Jevity 1.2'@65ml' /hr viaPEG with 324mProstat BID and 200 ml free water flush QID. Regimen provides 20272 kcals, 117 grams  protein and 2059 ml free water daily.   Per CSW notes, plan to d/c to SNF once medically stable.   Labs reviewed: CBGS: 99-128.   Diet Order:  DIET - DYS 1 Room service appropriate? Yes; Fluid consistency: Nectar Thick  EDUCATION NEEDS:   No education needs have been identified at this time  Skin:  Skin Assessment: Skin Integrity Issues: Skin Integrity Issues:: Incisions, Other (Comment) Incisions: lip Other: skin tear R leg  Last BM:  02/28/18  Height:   Ht Readings from Last 1 Encounters:  02/14/18 '5\' 9"'  (1.753 m)    Weight:   Wt Readings from Last 1 Encounters:  02/25/18 188 lb 4.4 oz (85.4 kg)    Ideal Body Weight:  72.7 kg  BMI:  Body mass index is 27.8 kg/m.  Estimated Nutritional Needs:   Kcal:  1950-2150 (23-25 kcal/kg bw)  Protein:  102-120g Pro (1.2-1.4 g/kg bw)  Fluid:  2-2.2 L fluid (1 ml/kcal)    John Neukam A. WiJimmye NormanRD, LDN, CDE Pager: [phone] hours Pager: [phone]

## 2018-02-28 NOTE — Progress Notes (Signed)
Pt peg tube site was bleeding changed the dressing paged on call [facility], endorsed to night shift nurse too.

## 2018-02-28 NOTE — Progress Notes (Signed)
Patient Demographics:    John Sampson, is a 82 y.o. male, DOB - [birth year], QPR:[id number]  Admit date - 02/14/2018   Admitting Physician Geradine Girt, DO  Outpatient Primary MD for the patient is Sinda Du, MD  LOS - 13  No chief complaint on file.       Subjective:    John Sampson today has no fevers, no emesis,  No chest pain, tolerating tube feed well, tolerating oral pured intake also, awaiting placement of skilled nursing facility pending insurance approval, patient Daughter  Ruthy Dick updated by phone  Assessment  & Plan :    Principal Problem:   Dysphagia Active Problems:   Essential hypertension, benign   Mixed hyperlipidemia   PAF (paroxysmal atrial fibrillation) (HCC)   Chronic diastolic congestive heart failure (HCC)   Hypothyroidism (acquired)   Abrasion of lower leg, left, initial encounter   Protein-calorie malnutrition, severe    Brief Narrative / Interim history: John Sampson a 82 y.o.malewith medical history significant ofleft 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 He presented to [facility] increasing SOB and hoarseness found to have VOcal cord paralysis. He was taken to the OR on 3/15 by Dr. Lucia Gaskins and underwent prolaryn injection to vocal cords, then presented 3/19 to Dr. Pollie Friar office and there was concern for dehydration and dysphagia so he was sent to [facility] for direct admission. Since the procedure, he has been able to have any solid or liquid food. Had Modified Barium swallow 3/20 with severe cervical esophageal swelling with resultant dysphagia with obstructing tissue at C5/6 and C7. Pt is unable to pass1/4 teaspoon of thin liquids despite maximal effortful swallow, -ENT consulted,3/20 started steroids and Abx, -no significant improvement in dysphagia yet, -CT neck 3/66fairly  extensive recurrence of his thyroid cancer adjacent to the trachea as well as into the anterior wall of his esophagusand probable lung mets . PEG placement 02/24/18     Assessment & Plan:  1)Severe Cervical esophageal swelling with dysphagia - -Status post prolaryn injection to vocal cords 3/15 for vocal cord paralysis,now has profound dysphagia, MBS 02/15/18 which noted Severe cervical esophageal swelling with resultant dysphagia with obstructing tissue at C5/6 and C7. -ENT, Dr.Newman following,treated with 1 week of ABx and IV decadron, no significant improvement in his dysphagia and continues to have significant cervical esophageal swelling , -CT neck 3/27 completed showed extensive recurrence of his thyroid cancer adjacent to the trachea and involving esophagus and probable lung metastases, stopped Decadron, - s/p PEG placement on 02/24/18, Speech pathologist recommends dysphagia 1 diet (pured) and nectar thickened liquids in addition to PEG tube feedings,.  Patient is tolerating PEG tube feeding as well as modified dysphagia 1 diet okay at this time  2)Aggressive papillary thyroid CA- -history of left papillary thyroid carcinoma with paralysis of vocal cord -history of thyroidectomy and laryngoplasty about 2 years ago, -appears to have Lung mets based this scan -ENT and  Dr.NEwman to d/w Radonc and Endocrine regarding options to Rx this.  Plan is for patient to go to rehab to get stronger and to have proper nutrition and then discuss further treatment options with Dr. Lucia Gaskins and radiation oncology once is functionally and nutritionally in  a better state  3)Leukocytosis-- down to 12,000, suspect due to combination of recent Decadron use and underlying malignancy, patient completed IV Zosyn on 02/25/2018, he had 10 days total of antibiotics, no evidence of ongoing active infection at this   4)P.Afib - -in NSR, and HR controlled, continue therapeutic Lovenox ( patient was on Eliquis at home),  upon discharge consider discharging back on Eliquis,  continue metoprolol 50 twice daily, hold off on Cardizem CD 240 heart rate appears to be well controlled at this time,   5)H/o Hypothyroidism-TSH is down to 0.024, free T4 is 1.2, prior to admission patient was on levothyroxine 125 mcg daily, which has been on hold now may restart levothyroxine at 75 mcg daily, recheck TSH in about 4 weeks, patient sees Dr. Jacelyn Pi  the oncologist in [place]  6)HFpEF-patient has history of chronic diastolic dysfunction CHF, last known EF of 55%, initially there was concern about volume overload and patient received IV Lasix, appears to be euvolemic at this time,  7)R leg abrasion/skin tear- -no s/s of infection, [facility] consult appreciated  8)Social/EThics-patient is a DNR, patient Daughter  Ruthy Dick updated by phone  9)Disposition- Awaiting placement of skilled nursing facility pending insurance approval  DVT prophylaxis: Lovenox Code Status: DNR Family Communication:  patient Daughter  Ruthy Dick updated by phone Disposition Plan: SNF possibly 03/01/2018 if insurance approves  Consultants:   ENT  IR  Procedures:   MBS x 2   Lab Results  Component Value Date   PLT 243 02/28/2018    Inpatient Medications  Scheduled Meds: . enoxaparin (LOVENOX) injection  1 mg/kg Subcutaneous Q12H  . feeding supplement (PRO-STAT SUGAR FREE 64)  30 mL Per Tube BID  . free water  200 mL Per Tube QID  . levothyroxine  75 mcg Oral QAC breakfast  . mouth rinse  15 mL Mouth Rinse BID  . metoprolol tartrate  50 mg Oral BID  .  morphine injection  1 mg Intravenous Once  . mupirocin ointment   Topical Daily   Continuous Infusions: . feeding supplement (JEVITY 1.2 CAL) 1 mL (02/28/18 0935)   PRN Meds:.acetaminophen **OR** acetaminophen, albuterol, hydrALAZINE, HYDROmorphone (DILAUDID) injection, ondansetron **OR** ondansetron (ZOFRAN) IV, RESOURCE THICKENUP CLEAR, sodium chloride flush,  white petrolatum    Anti-infectives (From admission, onward)   Start     Dose/Rate Route Frequency Ordered Stop   02/22/18 1845  piperacillin-tazobactam (ZOSYN) IVPB 3.375 g     3.375 g 12.5 mL/hr over 240 Minutes Intravenous Every 8 hours 02/22/18 1835 02/25/18 2152   02/20/18 1400  piperacillin-tazobactam (ZOSYN) IVPB 3.375 g  Status:  Discontinued     3.375 g 12.5 mL/hr over 240 Minutes Intravenous Every 8 hours 02/20/18 0903 02/22/18 1835   02/20/18 0915  piperacillin-tazobactam (ZOSYN) IVPB 3.375 g  Status:  Discontinued     3.375 g 12.5 mL/hr over 240 Minutes Intravenous Every 8 hours 02/20/18 0902 02/20/18 0903   02/15/18 1300  ampicillin-sulbactam (UNASYN) 1.5 g in sodium chloride 0.9 % 100 mL IVPB  Status:  Discontinued     1.5 g 200 mL/hr over 30 Minutes Intravenous Every 6 hours 02/15/18 1157 02/20/18 0855        Objective:   Vitals:   02/28/18 0505 02/28/18 0700 02/28/18 1056 02/28/18 1300  BP: (!) 181/55 (!) 164/62 140/60 136/62  Pulse: (!) 57 60 64 62  Resp: 16 16  16   Temp: 98.2 F (36.8 C) 97.9 F (36.6 C)  98.2 F (36.8 C)  TempSrc: Oral Oral  Oral  SpO2: 99% 98%  99%  Weight:      Height:        Wt Readings from Last 3 Encounters:  02/25/18 85.4 kg (188 lb 4.4 oz)  02/10/18 88 kg (194 lb)  01/25/18 90.3 kg (199 lb)     Intake/Output Summary (Last 24 hours) at 02/28/2018 1859 Last data filed at 02/28/2018 1700 Gross per 24 hour  Intake 2240 ml  Output 1250 ml  Net 990 ml     Physical Exam  Gen:- Awake Alert,  In no apparent distress  HEENT:- [place].AT, No sclera icterus Neck-Supple Neck,No JVD,. Scar from prior thyroidectomy Lungs-  CTAB , good air entry CV- S1, S2 normal Abd-  +ve B.Sounds, Abd Soft,, PEG tube site is clean dry and intact Extremity/Skin:- No  edema,   good pulses Psych-affect is appropriate, oriented x3 Neuro-no new focal deficits, no tremors   Data Review:   Micro Results No results found for this or any previous visit  (from the past 240 hour(s)).  Radiology Reports Ct Abdomen Wo Contrast  Result Date: 02/23/2018 CLINICAL DATA:  Dysphagia, aspiration risk, assess anatomy for gastrostomy insertion EXAM: CT ABDOMEN WITHOUT CONTRAST TECHNIQUE: Multidetector CT imaging of the abdomen was performed following the standard protocol without IV contrast. COMPARISON:  02/22/2018 modified swallow FINDINGS: Lower chest: Several bilateral lower chest pulmonary nodules all measuring 5 mm or less in size within the lower lobes, inferior right middle lobe, and lingula Right basilar atelectasis versus scarring also noted. Normal heart size. Native coronary atherosclerosis. Previous median sternotomy evident. Feeding tube extends into the stomach distally. Hepatobiliary: Cholelithiasis evident. No focal hepatic abnormality. No biliary dilatation or obstruction. Pancreas: No focal abnormality or ductal dilatation. No surrounding inflammatory process. Spleen: Normal in size. Adrenals/Urinary Tract: Normal adrenal glands. Kidneys demonstrate no acute obstructive uropathy or hydronephrosis. Ureters are symmetric and decompressed. Mild chronic perinephric strandy edema. Indeterminate posterior left renal hyperdense cortical lesion measures 10 mm this may represent a proteinaceous or hemorrhagic cyst. Stomach/Bowel: Feeding tube enters the stomach. No significant hiatal hernia. Stomach anatomy appears favorable for fluoroscopic gastrostomy technique. Stomach is inferior to the left hepatic lobe and the midportion of the stomach is superior to the transverse colon. Feeding tube extends into the duodenal bulb. Stomach is currently decompressed. Vascular/Lymphatic: Aortic atherosclerosis noted. Negative for aneurysm. No retroperitoneal hemorrhage or hematoma. No abdominal adenopathy. Other: No abdominal wall hernia. No free fluid or ascites. Negative for abscess. Musculoskeletal: No acute osseous finding. Anterior L2 vertebral body lytic lesion without  compression pathologic fracture. Appearance is suspicious for metastatic disease or myeloma. IMPRESSION: Stomach anatomy appears favorable for attempt at fluoroscopic gastrostomy insertion. Numerous subcentimeter bilateral pulmonary nodules suspicious for pulmonary metastases. Lytic L2 vertebral body suspicious for osseous metastasis. Atherosclerosis without aneurysm Cholelithiasis Electronically Signed   By: Jerilynn Mages.  Shick M.D.   On: 02/23/2018 16:06   Dg Chest 2 View  Result Date: 02/18/2018 CLINICAL DATA:  Leukocytosis. EXAM: CHEST - 2 VIEW COMPARISON:  Chest x-ray dated December 06, 2017. FINDINGS: Feeding tube noted with the tip likely in the distal stomach. Stable cardiomegaly status post CABG. Normal pulmonary vascularity. Emphysema. No focal consolidation, pleural effusion, or pneumothorax. Unchanged elevation of the right hemidiaphragm. No acute osseous abnormality. IMPRESSION: 1. COPD.  No active cardiopulmonary disease. Electronically Signed   By: Titus Dubin M.D.   On: 02/18/2018 11:37   Ct Soft Tissue Neck W Contrast  Result Date: 02/22/2018 CLINICAL DATA:  Improving dysphagia. Status  post laryngoscopy. History of thyroid cancer, thyroidectomy, laryngoplasty, pituitary macroadenoma. EXAM: CT NECK WITH CONTRAST TECHNIQUE: Multidetector CT imaging of the neck was performed using the standard protocol following the bolus administration of intravenous contrast. CONTRAST:  41mL ISOVUE-300 IOPAMIDOL (ISOVUE-300) INJECTION 61% COMPARISON:  CT neck August 25, 2016 FINDINGS: PHARYNX AND LARYNX: Focally sclerotic LEFT thyroid and cricoid cartilages consistent with tumoral involvement. Mild medial deviation RIGHT true vocal cord could reflect paralysis. Normal pharynx. SALIVARY GLANDS: Normal. THYROID: Status post thyroidectomy. 21 x 14 mm abnormal soft tissue LEFT posterior thyroid bed and appears to invade the anterior status soft [place]. Additional subcentimeter nodules inferior LEFT thyroid bed.  LYMPH NODES: 10 mm round LEFT level 4 lymph node. 9 mm LEFT level 7 lymph node with additional smaller bilateral 7 lymph nodes. VASCULAR: Moderate calcific atherosclerosis aortic arch. Moderate calcific atherosclerosis carotid bifurcations. LIMITED INTRACRANIAL: Normal. VISUALIZED ORBITS: Status post bilateral ocular lens implants. MASTOIDS AND VISUALIZED PARANASAL SINUSES: LEFT nasogastric tube. SKELETON: Nonacute.  Patient is edentulous. UPPER CHEST: Centrilobular emphysema. Multiple LEFT pulmonary nodules measuring to at least 11 mm, some of which are new from prior CT. Moderate centrilobular emphysema. Status post median sternotomy. OTHER: None. IMPRESSION: 1. Status post thyroidectomy with 21 x 14 mm mass LEFT thyroid bed consistent with invasive residual versus recurrent tumor (including suspected esophageal invasion). 2. New pathologic level for lymphadenopathy with probable additional smaller alignment lymph nodes LEFT level 7. 3. Multiple pulmonary nodules, some which are new from prior CT most compatible with progressive metastasis. Aortic Atherosclerosis (ICD10-I70.0) and Emphysema (ICD10-J43.9). Electronically Signed   By: Elon Alas M.D.   On: 02/22/2018 21:14   Ir Gastrostomy Tube Mod Sed  Result Date: 02/24/2018 INDICATION: Thyroid cancer, dysphagia, malnutrition EXAM: FLUOROSCOPIC 20 FRENCH PULL-THROUGH GASTROSTOMY Date:  02/24/2018 02/24/2018 1:43 pm Radiologist:  Jerilynn Mages. Daryll Brod, MD Guidance:  Fluoroscopic MEDICATIONS: 3.375 g Zosyn; Antibiotics were administered within 1 hour of the procedure. Glucagon 0.5 mg IV ANESTHESIA/SEDATION: Versed 1.5 mg IV; Fentanyl 75 mcg IV Moderate Sedation Time:  11 minutes The patient was continuously monitored during the procedure by the interventional radiology nurse under my direct supervision. CONTRAST:  10 cc Isovue-administered into the gastric lumen. FLUOROSCOPY TIME:  Fluoroscopy Time: 1 minutes 18 seconds (12 mGy). COMPLICATIONS: None immediate.  PROCEDURE: Informed consent was obtained from the patient following explanation of the procedure, risks, benefits and alternatives. The patient understands, agrees and consents for the procedure. All questions were addressed. A time out was performed. Maximal barrier sterile technique utilized including caps, mask, sterile gowns, sterile gloves, large sterile drape, hand hygiene, and betadine prep. The left upper quadrant was sterilely prepped and draped. An oral gastric catheter was inserted into the stomach under fluoroscopy. The existing nasogastric feeding tube was removed. Air was injected into the stomach for insufflation and visualization under fluoroscopy. The air distended stomach was confirmed beneath the anterior abdominal wall in the frontal and lateral projections. Under sterile conditions and local anesthesia, a 92 gauge trocar needle was utilized to access the stomach percutaneously beneath the left subcostal margin. Needle position was confirmed within the stomach under biplane fluoroscopy. Contrast injection confirmed position also. A single T tack was deployed for gastropexy. Over an Amplatz guide wire, a 9-French sheath was inserted into the stomach. A snare device was utilized to capture the oral gastric catheter. The snare device was pulled retrograde from the stomach up the esophagus and out the oropharynx. The 20-French pull-through gastrostomy was connected to the snare device and pulled antegrade  through the oropharynx down the esophagus into the stomach and then through the percutaneous tract external to the patient. The gastrostomy was assembled externally. Contrast injection confirms position in the stomach. Images were obtained for documentation. The patient tolerated procedure well. No immediate complication. IMPRESSION: Fluoroscopic insertion of a 20-French "pull-through" gastrostomy. Electronically Signed   By: Jerilynn Mages.  Shick M.D.   On: 02/24/2018 13:45   Dg Chest Port 1 View  Result  Date: 02/22/2018 CLINICAL DATA:  Dyspnea. History of COPD, previous MI, atrial flutter, chronic CHF. Former smoker. EXAM: PORTABLE CHEST 1 VIEW COMPARISON:  PA and lateral chest x-ray of February 18, 2018. FINDINGS: The lungs are adequately inflated and clear. The heart and pulmonary vascularity are normal. The mediastinum is normal in width. There are post CABG changes. There is calcification in the wall of the aortic arch. IMPRESSION: Previous CABG. No pneumonia, CHF, nor other acute cardiopulmonary abnormality. Electronically Signed   By: David  Martinique M.D.   On: 02/22/2018 14:22   Dg Addison Bailey G Tube Plc W/fl W/rad  Result Date: 02/15/2018 CLINICAL DATA:  Unable to swallow.  Need for feeding tube EXAM: NASO G TUBE PLACEMENT WITH FL AND WITH RAD CONTRAST:  10 mL Isovue contrast injected to the feeding tube. FLUOROSCOPY TIME:  Fluoroscopy Time:  8 minutes 4 seconds Radiation Exposure Index (if provided by the fluoroscopic device): Number of Acquired Spot Images: 0 COMPARISON:  Speech pathology study 02/15/2018 FINDINGS: The technologist initially attempted to place the tube, and had difficulty passing the tube through the pharynx into the esophagus. My presence was requested. After some effort, the tube was advanced into the stomach using a Glidewire. The tube was placed in the antrum however I was not able to get the tube into the duodenum despite multiple attempts with the tube and wire. Tube was left in the gastric antrum IMPRESSION: Feeding tube placed with the tip in the gastric antrum. I was not able to get the tube into the duodenum. Electronically Signed   By: Franchot Gallo M.D.   On: 02/15/2018 14:00   Dg Swallowing Func-speech Pathology  Result Date: 02/19/2018 Objective Swallowing Evaluation: Type of Study: MBS-Modified Barium Swallow Study  Patient Details Name: John Sampson MRN: [id number] Date of Birth: [birth year] Today's Date: 02/19/2018 Time: SLP Start Time (ACUTE ONLY): 1215 -SLP Stop Time (ACUTE  ONLY): 1240 SLP Time Calculation (min) (ACUTE ONLY): 25 min Past Medical History: Past Medical History: Diagnosis Date . Atrial flutter ([place])   on Eliquis . AV malformation of GI tract  . COPD (chronic obstructive pulmonary disease) ([place]) 3.12.14  2D Echo, EF 50-55% . Coronary atherosclerosis of native coronary artery   Multivessel status post CABG . Gastric ulcer   Small - nonbleeding . GERD (gastroesophageal reflux disease)  . Headache(784.0)  . Hypothyroidism  . Iron deficiency anemia   Negative Givens capsule study  . Myocardial infarction ([place])  . PAD (peripheral artery disease) (HCC)   Moderate bilateral SFA disease at angiography 01/2013 . Pituitary macroadenoma ([place])  . Thyroid cancer ([place])  . Vocal cord paralysis   left Past Surgical History: Past Surgical History: Procedure Laterality Date . ABDOMINAL AORTAGRAM N/A 02/19/2013  Procedure: ABDOMINAL Maxcine Ham;  Surgeon: [name], MD;  Location: [facility] CATH LAB;  Service: Cardiovascular;  Laterality: N/A; . CARDIAC CATHETERIZATION   . CARDIOVERSION N/A 06/16/2015  Procedure: CARDIOVERSION;  Surgeon: Satira Sark, MD;  Location: [facility] ORS;  Service: Cardiovascular;  Laterality: N/A; . COLONOSCOPY  08/18/2012  Procedure: COLONOSCOPY;  Surgeon: [name], MD;  Location: [facility] ENDO SUITE;  Service: Endoscopy;  Laterality: N/A;  1:25 . COLONOSCOPY N/A 09/12/2017  Procedure: COLONOSCOPY;  Surgeon: [name], MD;  [facility] ENDO SU[facility];  Service: Endoscopy;  Laterality: N/A; . CORONARY ARTERY BYPASS GRAFT  2003  5 grafts - details not clear . CRANIOTOMY  12/31/2011  Procedure: CRANIOTOMY HYPOPHYSECTOMY TRANSNASAL APPROACH;  Surgeon: [name], MD;  Location: [place] NEURO ORS;  Service: Neurosurgery;  Laterality: N/A;  Transphenoidal Hypophysectomy With Fat Graft Harvest from right abdomen  . ESOPHAGOGASTRODUODENOSCOPY  08/18/2012  Procedure: ESOPHAGOGASTRODUODENOSCOPY (EGD);  Surgeon: [name], MD;  [facility];  Service: Endoscopy;   Laterality: N/A; . ESOPHAGOGASTRODUODENOSCOPY N/A 03/31/2017  Procedure: ESOPHAGOGASTRODUODENOSCOPY (EGD);  Surgeon: [name], MD;  [facility];  Service: Endoscopy;  Laterality: N/A; . ESOPHAGOGASTRODUODENOSCOPY N/A 09/12/2017  Procedure: ESOPHAGOGASTRODUODENOSCOPY (EGD);  Surgeon: [name], MD;  [facility];  Service: Endoscopy;  Laterality: N/A;  10:40 . EYE SURGERY  2012 . GIVENS CAPSULE STUDY N/A 01/25/2013  Procedure: GIVENS CAPSULE STUDY;  Surgeon: [name], MD;  [facility];  Service: Endoscopy;  Laterality: N/A;  730 . HOT HEMOSTASIS  03/31/2017  Procedure: HOT HEMOSTASIS (ARGON PLASMA COAGULATION/BICAP);  Surgeon: [name], MD;  [facility];  Service: Endoscopy;;  duodenum . LARYNGOPLASTY Left 09/14/2016  Procedure: LARYNGOPLASTY;  Surgeon: [name], MD;  Location: [place];  Service: ENT;  Laterality: Left; . LOWER EXTREMITY ANGIOGRAM N/A 02/19/2013  Procedure: LOWER EXTREMITY ANGIOGRAM;  Surgeon: [name], MD;  Location: [facility] CATH LAB;  Service: Cardiovascular;  Laterality: N/A; . MICROLARYNGOSCOPY N/A 02/10/2018  Procedure: MICROLARYNGOSCOPY WITH VOCAL CORD INJECTION;  Surgeon: [name], MD;  Location: [place];  Service: ENT;  Laterality: N/A; . PERIPHERAL VASCULAR CATHETERIZATION N/A 01/19/2016  Procedure: Abdominal Aortogram;  Surgeon: Conrad Carefree, MD;  Location: [place] CV LAB;  Service: Cardiovascular;  Laterality: N/A; . PERIPHERAL VASCULAR CATHETERIZATION Bilateral 01/19/2016  Procedure: Lower Extremity Angiography;  Surgeon: Conrad North English, MD;  Location: [place] CV LAB;  Service: Cardiovascular;  Laterality: Bilateral; . PV Angiogram  02/19/13  Indications: slow healing left calf ulcer . Stress Myocardial Perfusion  12/08/2011  Indications: Abnormal EKG, Eval of prior GABG . TEE WITHOUT CARDIOVERSION N/A 06/16/2015  Procedure: TRANSESOPHAGEAL ECHOCARDIOGRAM (TEE);  Surgeon:  Satira Sark, MD;  [facility];  Service: Cardiovascular;  Laterality: N/A; . THYROIDECTOMY N/A 09/14/2016  Procedure: TOTAL THYROIDECTOMY;  Surgeon: [name], MD;  Location: [place];  Service: ENT;  Laterality: N/A; . TONSILLECTOMY  Age 7 HPI: John Sampson a 82 y.o.malewith medical history significant ofleft papillary thyroid carcinoma with paralysis of left vocal cord. Also with total thyroidectomy and laryngoplasty. He presented to [facility] increasing SOB and hoarseness. He was taken to the OR on 3/15 by Dr. Lucia Gaskins for injection of left vocal cord to strengthen his voice. He presented today to Dr. Pollie Friar office and there was concern for dehydration and dysphagia so he was sent to [facility] for direct admission (actually observation). Since the procedure, he has been able to have any solid or liquid food - it just won't go down or it goes down and comes right back up. Also unable to swallow any pills. He had the procedure Friday and did fine. He was told to go home with a soft diet and he was able to eat and drink that night. He  ate breakfast ok Saturday but he wasn't able to eat after that. Dr. Shirlee More that on in-office exam the vocal cords are clear without swelling or infection but the patient was unable to swallow water in his office. He recommends IVF, labs, modified barium swallow evaluationfor esophageal obstruction/aspiration.  Subjective: Alert, cooperative Assessment / Plan / Recommendation CHL IP CLINICAL IMPRESSIONS 02/19/2018 Clinical Impression Patient presents with improving, more moderate cervical esophageal dysphagia due to obstructing tissue at C5/6 and C7. Pt is able to pass approximately ~50% bolus through the cervical esophagus in head neutral position, however there is penetration of backflow after the swallow (worse with liquids) with all consistencies, and there is sensed aspiration with thin liquids. Compensatory manuevers attempted, and most  effective was head turn left with puree and honey-thick liquids and multiple dry swallows to decrease residue. Pt then able to hock to expectorate mild residue that remained in the vallecule. Head turn did not prevent penetration of backflow of thin and nectar thick liquids from the cervical esophagus. Chin tuck was not effective. Pt educated in real time using video feedback re: use of strategies and frequent oral care to reduce aspiration risk. Recommend inititating dys 1 (puree) and honey thick liquids with head turn left and multiple swallows per bite, cough or hock to expectorate periodically. As consuming POs will be a lengthy and laborious process due to number of swallows required, would consider continuation of cortrak for now as pt may have difficulty maintaining adequate nutrition/hydration with POs alone. Pt encouraged to complete thorough oral care before and after meals.  SLP Visit Diagnosis Dysphagia, pharyngoesophageal phase (R13.14) Attention and concentration deficit following -- Frontal lobe and executive function deficit following -- Impact on safety and function Moderate aspiration risk   CHL IP TREATMENT RECOMMENDATION 02/19/2018 Treatment Recommendations Therapy as outlined in treatment plan below   Prognosis 02/19/2018 Prognosis for Safe Diet Advancement Good Barriers to Reach Goals -- Barriers/Prognosis Comment -- CHL IP DIET RECOMMENDATION 02/19/2018 SLP Diet Recommendations Dysphagia 1 (Puree) solids;Honey thick liquids;Alternative means - temporary Liquid Administration via Cup;Spoon Medication Administration Via alternative means Compensations Multiple dry swallows after each bite/sip;Effortful swallow;Other (Comment) Postural Changes Seated upright at 90 degrees   CHL IP OTHER RECOMMENDATIONS 02/19/2018 Recommended Consults -- Oral Care Recommendations Oral care before and after PO Other Recommendations Order thickener from pharmacy;Prohibited food (jello, ice cream, thin soups);Remove  water pitcher;Have oral suction available   CHL IP FOLLOW UP RECOMMENDATIONS 02/19/2018 Follow up Recommendations Other (comment)   CHL IP FREQUENCY AND DURATION 02/19/2018 Speech Therapy Frequency (ACUTE ONLY) min 2x/week Treatment Duration 2 weeks      CHL IP ORAL PHASE 02/19/2018 Oral Phase WFL Oral - Pudding Teaspoon -- Oral - Pudding Cup -- Oral - Honey Teaspoon -- Oral - Honey Cup -- Oral - Nectar Teaspoon -- Oral - Nectar Cup -- Oral - Nectar Straw -- Oral - Thin Teaspoon -- Oral - Thin Cup -- Oral - Thin Straw -- Oral - Puree -- Oral - Mech Soft -- Oral - Regular -- Oral - Multi-Consistency -- Oral - Pill -- Oral Phase - Comment --  CHL IP PHARYNGEAL PHASE 02/19/2018 Pharyngeal Phase WFL Pharyngeal- Pudding Teaspoon -- Pharyngeal -- Pharyngeal- Pudding Cup -- Pharyngeal -- Pharyngeal- Honey Teaspoon -- Pharyngeal -- Pharyngeal- Honey Cup -- Pharyngeal -- Pharyngeal- Nectar Teaspoon -- Pharyngeal -- Pharyngeal- Nectar Cup -- Pharyngeal -- Pharyngeal- Nectar Straw -- Pharyngeal -- Pharyngeal- Thin Teaspoon -- Pharyngeal -- Pharyngeal- Thin Cup -- Pharyngeal -- Pharyngeal- Thin Straw --  Pharyngeal -- Pharyngeal- Puree -- Pharyngeal -- Pharyngeal- Mechanical Soft -- Pharyngeal -- Pharyngeal- Regular -- Pharyngeal -- Pharyngeal- Multi-consistency -- Pharyngeal -- Pharyngeal- Pill -- Pharyngeal -- Pharyngeal Comment --  CHL IP CERVICAL ESOPHAGEAL PHASE 02/19/2018 Cervical Esophageal Phase Impaired Pudding Teaspoon -- Pudding Cup -- Honey Teaspoon Reduced cricopharyngeal relaxation;Esophageal backflow into the pharynx;Esophageal backflow into the larynx Honey Cup Reduced cricopharyngeal relaxation;Esophageal backflow into the pharynx;Esophageal backflow into the larynx Nectar Teaspoon -- Nectar Cup Reduced cricopharyngeal relaxation;Esophageal backflow into the pharynx;Esophageal backflow into the larynx Nectar Straw -- Thin Teaspoon Reduced cricopharyngeal relaxation;Esophageal backflow into the pharynx;Esophageal  backflow into the larynx Thin Cup Reduced cricopharyngeal relaxation;Esophageal backflow into the pharynx;Esophageal backflow into the larynx Thin Straw NT Puree Reduced cricopharyngeal relaxation;Esophageal backflow into the pharynx;Esophageal backflow into the larynx Mechanical Soft -- Regular -- Multi-consistency -- Pill -- Cervical Esophageal Comment head turn left prevents penetration with honey thick liquids, puree only Deneise Lever, MS, [place] [phone] No flowsheet data found. Aliene Altes 02/19/2018, 1:58 PM              Dg Swallowing Func-speech Pathology  Result Date: 02/15/2018 Objective Swallowing Evaluation: Type of Study: MBS-Modified Barium Swallow Study  Patient Details Name: John Sampson MRN: [id number] Date of Birth: 04-19-1936 Today's Date: 02/15/2018 Time: SLP Start Time (ACUTE ONLY): 1000 -SLP Stop Time (ACUTE ONLY): 1027 SLP Time Calculation (min) (ACUTE ONLY): 27 min Past Medical History: Past Medical History: Diagnosis Date . Atrial flutter ([place])   on Eliquis . AV malformation of GI tract  . COPD (chronic obstructive pulmonary disease) ([place]) 3.12.14  2D Echo, EF 50-55% . Coronary atherosclerosis of native coronary artery   Multivessel status post CABG . Gastric ulcer   Small - nonbleeding . GERD (gastroesophageal reflux disease)  . Headache(784.0)  . Hypothyroidism  . Iron deficiency anemia   Negative Givens capsule study  . Myocardial infarction ([place])  . PAD (peripheral artery disease) (HCC)   Moderate bilateral SFA disease at angiography 01/2013 . Pituitary macroadenoma ([place])  . Thyroid cancer ([place])  . Vocal cord paralysis   left Past Surgical History: Past Surgical History: Procedure Laterality Date . ABDOMINAL AORTAGRAM N/A 02/19/2013  Procedure: ABDOMINAL Maxcine [place];  Surgeon: [name], MD;  Location: [facility] CATH LAB;  Service: Cardiovascular;  Laterality: N/A; . CARDIAC CATHETERIZATION   . CARDIOVERSION N/A 06/16/2015  Procedure: CARDIOVERSION;   Surgeon: Satira Sark, MD;  [facility]: [facility] ORS;  Service: Cardiovascular;  Laterality: N/A; . COLONOSCOPY  08/18/2012  Procedure: COLONOSCOPY;  Surgeon: [name], MD;  [facility] ENDO SU[facility];  Service: Endoscopy;  Laterality: N/A;  1:25 . COLONOSCOPY N/A 09/12/2017  Procedure: COLONOSCOPY;  Surgeon: [name], MD;  [facility];  Service: Endoscopy;  Laterality: N/A; . CORONARY ARTERY BYPASS GRAFT  2003  5 grafts - details not clear . CRANIOTOMY  12/31/2011  Procedure: CRANIOTOMY HYPOPHYSECTOMY TRANSNASAL APPROACH;  Surgeon: [name], MD;  Location: [place] NEURO ORS;  Service: Neurosurgery;  Laterality: N/A;  Transphenoidal Hypophysectomy With Fat Graft Harvest from right abdomen  . ESOPHAGOGASTRODUODENOSCOPY  08/18/2012  Procedure: ESOPHAGOGASTRODUODENOSCOPY (EGD);  Surgeon: [name], MD;  [facility];  Service: Endoscopy;  Laterality: N/A; . ESOPHAGOGASTRODUODENOSCOPY N/A 03/31/2017  Procedure: ESOPHAGOGASTRODUODENOSCOPY (EGD);  Surgeon: [name], MD;  [facility];  Service: Endoscopy;  Laterality: N/A; . ESOPHAGOGASTRODUODENOSCOPY N/A 09/12/2017  Procedure: ESOPHAGOGASTRODUODENOSCOPY (EGD);  Surgeon: [name], MD;  [facility];  Service: Endoscopy;  Laterality: N/A;  10:40 . EYE SURGERY  2012 . GIVENS CAPSULE STUDY N/A 01/25/2013  Procedure: GIVENS CAPSULE STUDY;  Surgeon: [name], MD;  [facility];  Service: Endoscopy;  Laterality: N/A;  730 . HOT HEMOSTASIS  03/31/2017  Procedure: HOT HEMOSTASIS (ARGON PLASMA COAGULATION/BICAP);  Surgeon: [name], MD;  [facility];  Service: Endoscopy;;  duodenum . LARYNGOPLASTY Left 09/14/2016  Procedure: LARYNGOPLASTY;  Surgeon: [name], MD;  Location: [place];  Service: ENT;  Laterality: Left; . LOWER EXTREMITY ANGIOGRAM N/A 02/19/2013  Procedure: LOWER EXTREMITY ANGIOGRAM;  Surgeon: [name], MD;  Location: [facility] CATH LAB;  Service:  Cardiovascular;  Laterality: N/A; . MICROLARYNGOSCOPY N/A 02/10/2018  Procedure: MICROLARYNGOSCOPY WITH VOCAL CORD INJECTION;  Surgeon: [name], MD;  Location: [place];  Service: ENT;  Laterality: N/A; . PERIPHERAL VASCULAR CATHETERIZATION N/A 01/19/2016  Procedure: Abdominal Aortogram;  Surgeon: Conrad Cadillac, MD;  Location: [place] CV LAB;  Service: Cardiovascular;  Laterality: N/A; . PERIPHERAL VASCULAR CATHETERIZATION Bilateral 01/19/2016  Procedure: Lower Extremity Angiography;  Surgeon: Conrad Carmichael, MD;  Location: [place] CV LAB;  Service: Cardiovascular;  Laterality: Bilateral; . PV Angiogram  02/19/13  Indications: slow healing left calf ulcer . Stress Myocardial Perfusion  12/08/2011  Indications: Abnormal EKG, Eval of prior GABG . TEE WITHOUT CARDIOVERSION N/A 06/16/2015  Procedure: TRANSESOPHAGEAL ECHOCARDIOGRAM (TEE);  Surgeon: Satira Sark, MD;  [facility];  Service: Cardiovascular;  Laterality: N/A; . THYROIDECTOMY N/A 09/14/2016  Procedure: TOTAL THYROIDECTOMY;  Surgeon: [name], MD;  Location: [place];  Service: ENT;  Laterality: N/A; . 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 Also with total thyroidectomy and laryngoplasty. He presented to [facility] increasing SOB and hoarseness. He was taken to the OR on 3/15 by Dr. Lucia Gaskins for injection of left vocal cord to strengthen his voice. He presented today to Dr. Pollie Friar office and there was concern for dehydration and dysphagia so he was sent to [facility] for direct admission (actually observation). Since the procedure, he has been able to have any solid or liquid food - it just won't go down or it goes down and comes right back up. Also unable to swallow any pills. He had the procedure Friday and did fine. He was told to go home with a soft diet and he was able to eat and drink  that night. He ate breakfast ok Saturday but he wasn't able to eat after that. Dr. Shirlee More that on in-office exam the vocal cords are clear without swelling or infection but the patient was unable to swallow water in his office. He recommends IVF, labs, modified barium swallow evaluationfor esophageal obstruction/aspiration.  No Data Recorded Assessment / Plan / Recommendation CHL IP CLINICAL IMPRESSIONS 02/15/2018 Clinical Impression Pt demosntrates a severe cervical esophageal dysphagia with obstructing tissue at C5/6 and C7. Pt is unable to pass 1/4 teaspoon of thin liquids despite maximal effortful swallow or a head turn R or L. Chin tuck also ineffective. Attempted large bolus to adress if heavier propulsion could force more opening without success. 90% or more of the bolus returned to the pharynx and was aspriated with sensation. Pt does have a hard cough ability and can expectorate residue. Recommend pt remain fully NPO. Discussed result with MD, will need ENT to review study images and address needs. Pt may need a temporary  means of nutrition, though passage of any NG would likely be difficult even under fluoroscopy and could be contraindicated. Esophagram discontinued due to pt inability to consume any quantity of barium for further esophageal assessment.  SLP Visit Diagnosis Dysphagia, pharyngoesophageal phase (R13.14) Attention and concentration deficit following -- Frontal lobe and executive function deficit following -- Impact on safety and function Severe aspiration risk;Risk for inadequate nutrition/hydration   CHL IP TREATMENT RECOMMENDATION 02/15/2018 Treatment Recommendations Therapy as outlined in treatment plan below   Prognosis 02/15/2018 Prognosis for Safe Diet Advancement Good Barriers to Reach Goals -- Barriers/Prognosis Comment -- CHL IP DIET RECOMMENDATION 02/15/2018 SLP Diet Recommendations NPO;Alternative means - temporary Liquid Administration via -- Medication Administration Via  alternative means Compensations -- Postural Changes --   CHL IP OTHER RECOMMENDATIONS 02/15/2018 Recommended Consults Consider ENT evaluation Oral Care Recommendations -- Other Recommendations --   CHL IP FOLLOW UP RECOMMENDATIONS 02/15/2018 Follow up Recommendations 24 hour supervision/assistance   CHL IP FREQUENCY AND DURATION 02/15/2018 Speech Therapy Frequency (ACUTE ONLY) min 2x/week Treatment Duration 2 weeks      CHL IP ORAL PHASE 02/15/2018 Oral Phase WFL Oral - Pudding Teaspoon -- Oral - Pudding Cup -- Oral - Honey Teaspoon -- Oral - Honey Cup -- Oral - Nectar Teaspoon -- Oral - Nectar Cup -- Oral - Nectar Straw -- Oral - Thin Teaspoon -- Oral - Thin Cup -- Oral - Thin Straw -- Oral - Puree -- Oral - Mech Soft -- Oral - Regular -- Oral - Multi-Consistency -- Oral - Pill -- Oral Phase - Comment --  CHL IP PHARYNGEAL PHASE 02/15/2018 Pharyngeal Phase WFL Pharyngeal- Pudding Teaspoon -- Pharyngeal -- Pharyngeal- Pudding Cup -- Pharyngeal -- Pharyngeal- Honey Teaspoon -- Pharyngeal -- Pharyngeal- Honey Cup -- Pharyngeal -- Pharyngeal- Nectar Teaspoon -- Pharyngeal -- Pharyngeal- Nectar Cup -- Pharyngeal -- Pharyngeal- Nectar Straw -- Pharyngeal -- Pharyngeal- Thin Teaspoon -- Pharyngeal -- Pharyngeal- Thin Cup -- Pharyngeal -- Pharyngeal- Thin Straw -- Pharyngeal -- Pharyngeal- Puree -- Pharyngeal -- Pharyngeal- Mechanical Soft -- Pharyngeal -- Pharyngeal- Regular -- Pharyngeal -- Pharyngeal- Multi-consistency -- Pharyngeal -- Pharyngeal- Pill -- Pharyngeal -- Pharyngeal Comment --  CHL IP CERVICAL ESOPHAGEAL PHASE 02/15/2018 Cervical Esophageal Phase Impaired Pudding Teaspoon -- Pudding Cup -- Honey Teaspoon -- Honey Cup -- Nectar Teaspoon -- Nectar Cup -- Nectar Straw -- Thin Teaspoon Reduced cricopharyngeal relaxation;Esophageal backflow into the pharynx;Esophageal backflow into the larynx Thin Cup -- Thin Straw Reduced cricopharyngeal relaxation;Esophageal backflow into the pharynx;Esophageal backflow into the  larynx Puree -- Mechanical Soft -- Regular -- Multi-consistency -- Pill -- Cervical Esophageal Comment -- No flowsheet data found. DeBlo[place], Katherene Ponto 02/15/2018, 10:41 AM                CBC Recent Labs  Lab 02/22/18 0425 02/23/18 0406 02/25/18 0707 02/27/18 0451 02/28/18 0427  WBC 22.8* 24.6* 14.6* 13.1* 12.9*  HGB 11.8* 12.0* 11.7* 10.8* 10.1*  HCT 39.4 38.9* 38.3* 34.9* 34.2*  PLT 263 254 219 235 243  MCV 84.7 83.3 85.1 84.7 85.9  MCH 25.4* 25.7* 26.0 26.2 25.4*  MCHC 29.9* 30.8 30.5 30.9 29.5*  RDW 20.7* 20.5* 20.6* 20.6* 20.5*   Chemistries  Recent Labs  Lab 02/22/18 0425 02/23/18 0406 02/25/18 0707 02/27/18 0451  NA 134* 134* 134* 134*  K 5.1 3.8 4.2 3.8  CL 96* 96* 102 100*  CO2 30 28 25 28   GLUCOSE 149* 130* 98 123*  BUN 48* 43* 24* 23*  CREATININE 1.03 0.98 0.97 0.83  CALCIUM 8.7* 8.7* 8.5* 8.8*   ------------------------------------------------------------------------------------------------------------------ No results for input(s): CHOL, HDL, LDLCALC, TRIG, CHOLHDL, LDLDIRECT in the last 72 hours.  Lab Results  Component Value Date   HGBA1C 5.8 (H) 08/14/2013  ------------------------------------------------------------------------------------------------------------------ No results for input(s): TSH, T4TOTAL, T3FREE, THYROIDAB in the last 72 hours.  Invalid input(s): FREET3 ------------------------------------------------------------------------------------------------------------------ No results for input(s): VITAMINB12, FOLATE, FERRITIN, TIBC, IRON, RETICCTPCT in the last 72 hours.  Coagulation profile Recent Labs  Lab 02/23/18 2053  INR 1.00   No results for input(s): DDIMER in the last 72 hours.  Cardiac Enzymes No results for input(s): CKMB, TROPONINI, MYOGLOBIN in the last 168 hours.  Invalid input(s): CK ------------------------------------------------------------------------------------------------------------------    Component  Value Date/Time   BNP 305.0 (H) 12/06/2017 1232   Roxan Hockey M.D on 02/28/2018 at 6:59 PM  Between 7am to 7pm - Pager - [phone]  After 7pm go to [email or web address] - password [email or web address]  [facility] -  Office  [phone]  Voice Recognition Viviann Spare dictation system was used to create this note, attempts have been made to correct errors. Please contact the author with questions and/or clarifications.

## 2018-02-28 NOTE — Progress Notes (Addendum)
ANTICOAGULATION CONSULT NOTE - Initial Consult  Pharmacy Consult for Lovenox Indication: atrial fibrillation  Allergies  Allergen Reactions  . Morphine And Related Nausea And Vomiting    Patient Measurements: Height: 5\' 9"  (175.3 cm) Weight: 188 lb 4.4 oz (85.4 kg) IBW/kg (Calculated) : 70.7  Vital Signs: Temp: 97.9 F (36.6 C) (04/02 0700) Temp Source: Oral (04/02 0700) BP: 164/62 (04/02 0700) Pulse Rate: 60 (04/02 0700)  Labs: Recent Labs    02/27/18 0451 02/28/18 0427  HGB 10.8* 10.1*  HCT 34.9* 34.2*  PLT 235 243  CREATININE 0.83  --     Estimated Creatinine Clearance: 76.9 mL/min (by C-G formula based on SCr of 0.83 mg/dL).   Medical History: Past Medical History:  Diagnosis Date  . Atrial flutter ([place])    on Eliquis  . AV malformation of GI tract   . COPD (chronic obstructive pulmonary disease) ([place]) 3.12.14   2D Echo, EF 50-55%  . Coronary atherosclerosis of native coronary artery    Multivessel status post CABG  . Gastric ulcer    Small - nonbleeding  . GERD (gastroesophageal reflux disease)   . Headache(784.0)   . Hypothyroidism   . Iron deficiency anemia    Negative Givens capsule study   . Myocardial infarction ([place])   . PAD (peripheral artery disease) (HCC)    Moderate bilateral SFA disease at angiography 01/2013  . Pituitary macroadenoma ([place])   . Thyroid cancer ([place])   . Vocal cord paralysis    left   Assessment: 82 yo M on Eliquis PTA for AFib. Has had dysphagia so transitioned to Lovenox. GT tube placed on 3/29 so Lovenox held. Now restarting on 3/30 evening. Hgb stable at 10.1, plts wnl, SCr stable.   Goal of Therapy:  Monitor platelets by anticoagulation protocol: Yes   Plan:  Continue enoxparin 85mg  [place] Q12h  Monitor CBC, s/s of bleed F/U transition back to [place] , PharmD, BCPS Clinical Pharmacist 02/28/2018,7:56 AM

## 2018-02-28 NOTE — Social Work (Signed)
CSW spoke with pt at bedside, pt still amenable to SNF placement. Pt states that he is unsure of his current treatments for his cancer. CSW explained that often SNFs are not able to provide transportation for pt's to treatment and the medications needed are often cost prohibitive. Pt states understanding. CSW requested to talk with pt son Konrad Dolores. Verbal permission granted.  CSW spoke with pt son Konrad Dolores on the phone, pt son also confused about plans for further treatment and is requesting an MD f/u with him about treatment plans. CSW explained rehab recommendations- pt son amenable for placement still. CSW f/u with [facility] area placements.   Alexander Mt, [place] Work [phone]

## 2018-02-28 NOTE — Progress Notes (Signed)
Patient's dressing over  g-tube site. Gauze that were removed were saturated with bright red blood. Site was oozing bright red blood during cleansing of site. New dressing with 10 gauze pads were applied.

## 2018-03-01 DIAGNOSIS — I10 Essential (primary) hypertension: Secondary | ICD-10-CM

## 2018-03-01 DIAGNOSIS — I480 Paroxysmal atrial fibrillation: Secondary | ICD-10-CM

## 2018-03-01 DIAGNOSIS — R1310 Dysphagia, unspecified: Secondary | ICD-10-CM

## 2018-03-01 DIAGNOSIS — E039 Hypothyroidism, unspecified: Secondary | ICD-10-CM

## 2018-03-01 LAB — CBC
HCT: 32.1 % — ABNORMAL LOW (ref 39.0–52.0)
HEMOGLOBIN: 9.6 g/dL — AB (ref 13.0–17.0)
MCH: 25.8 pg — AB (ref 26.0–34.0)
MCHC: 29.9 g/dL — AB (ref 30.0–36.0)
MCV: 86.3 fL (ref 78.0–100.0)
Platelets: 281 10*3/uL (ref 150–400)
RBC: 3.72 MIL/uL — ABNORMAL LOW (ref 4.22–5.81)
RDW: 20.7 % — ABNORMAL HIGH (ref 11.5–15.5)
WBC: 13.4 10*3/uL — ABNORMAL HIGH (ref 4.0–10.5)

## 2018-03-01 LAB — GLUCOSE, CAPILLARY
GLUCOSE-CAPILLARY: 106 mg/dL — AB (ref 65–99)
GLUCOSE-CAPILLARY: 119 mg/dL — AB (ref 65–99)
Glucose-Capillary: 108 mg/dL — ABNORMAL HIGH (ref 65–99)
Glucose-Capillary: 115 mg/dL — ABNORMAL HIGH (ref 65–99)
Glucose-Capillary: 97 mg/dL (ref 65–99)
Glucose-Capillary: 98 mg/dL (ref 65–99)

## 2018-03-01 NOTE — Social Work (Addendum)
CSW left HIPPA compliant message on son Thommy's voice mail to discuss discharge plans.   10:30am- CSW has spoken with son, pt daughter lives in [place], but the siblings talk to each other. The son will look at options and talk to dad about offers when he comes in this morning. CSW spoke with the pt at bedside and pt states that he will look at packet and then speak with son. Pt expressed concern to CSW about wound around PEG, this was relayed to MD and RN.   1:20pm- CSW spoke with pt son, pt son would like to accept bed at [facility] at [place], this is close to pt home and pt family/friends. Pt son has spoken with his sister also in regards to this.     CSW continuing to follow.  Alexander Mt, [place] Work [phone]

## 2018-03-01 NOTE — Progress Notes (Signed)
Physical Therapy Treatment Patient Details Name: John Sampson MRN: [id number] DOB: [birth year] Today's Date: 03/01/2018    History of Present Illness Pt is a 82 y.o. 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  Also with total thyroidectomy and laryngoplasty.  He presented to ENT with increasing SOB and hoarseness.  He was taken to the OR on 3/15 by Dr. Lucia Gaskins for injection of left vocal cord to strengthen his voice.   He presented today to Dr. Pollie Friar office and there was concern for dehydration and dysphagia so he was sent to [facility]. Went home after this but was still unable to swallow so returned and now has NG tube - now s/p PEG. 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 He requires Min guard for stand pivot transfer back to chair today, and also required Min guard and extended time for functional transfers and gait approximately 43f today. Gait distance limited by fatigue. He was left up in the chair with all needs met this morning.     Follow Up Recommendations  SNF     Equipment Recommendations  Rolling walker with 5" wheels    Recommendations for Other Services       Precautions / Restrictions Precautions Precautions: Fall Precaution Comments: has been in bed past week Restrictions Weight Bearing Restrictions: No Other Position/Activity Restrictions: PEG tube    Mobility  Bed Mobility               General bed mobility comments: DNT, received up in chair   Transfers Overall transfer level: Needs assistance Equipment used: Rolling walker (2 wheeled) Transfers: Sit to/from Stand Sit to Stand: Min guard         General transfer  comment: Min guard, extended time   Ambulation/Gait Ambulation/Gait assistance: Min guard Ambulation Distance (Feet): 80 Feet Assistive device: Rolling walker (2 wheeled) Gait Pattern/deviations: Step-through pattern;Shuffle;Trunk flexed     General Gait Details: did not require rest breaks today, but gait distance was limited due to fatigue    Stairs            Wheelchair Mobility    Modified Rankin (Stroke Patients Only)       Balance Overall balance assessment: Needs assistance Sitting-balance support: Single extremity supported Sitting balance-Leahy Scale: Fair     Standing balance support: Bilateral upper extremity supported Standing balance-Leahy Scale: Fair Standing balance comment: reliant on UE support                            Cognition Arousal/Alertness: Awake/alert Behavior During Therapy: Flat affect Overall Cognitive Status: Within Functional Limits for tasks assessed                                 General Comments: Pt requiring increased time for responding to questiosn and cues. Pt with short answers      Exercises      General Comments        Pertinent Vitals/Pain Pain Assessment: No/denies pain Pain Score: 0-No pain Faces Pain Scale: No hurt Pain Intervention(s): Limited  activity within patient's tolerance;Monitored during session    Home Living                      Prior Function            PT Goals (current goals can now be found in the care plan section) Acute Rehab PT Goals Patient Stated Goal: get rid of NG tube PT Goal Formulation: With patient Time For Goal Achievement: 03/07/18 Potential to Achieve Goals: Good Progress towards PT goals: Progressing toward goals    Frequency    Min 3X/week      PT Plan Current plan remains appropriate    Co-evaluation              AM-PAC PT "6 Clicks" Daily Activity  Outcome Measure  Difficulty turning over in bed (including  adjusting bedclothes, sheets and blankets)?: A Little Difficulty moving from lying on back to sitting on the side of the bed? : A Little Difficulty sitting down on and standing up from a chair with arms (e.g., wheelchair, bedside commode, etc,.)?: A Little Help needed moving to and from a bed to chair (including a wheelchair)?: A Little Help needed walking in hospital room?: A Little Help needed climbing 3-5 steps with a railing? : A Lot 6 Click Score: 17    End of Session Equipment Utilized During Treatment: Gait belt;Oxygen Activity Tolerance: Patient limited by fatigue Patient left: in chair;with call bell/phone within reach   PT Visit Diagnosis: Unsteadiness on feet (R26.81);Muscle weakness (generalized) (M62.81);Difficulty in walking, not elsewhere classified (R26.2) Pain - Right/Left: Left Pain - part of body: Leg     Time: 9643-8381 PT Time Calculation (min) (ACUTE ONLY): 18 min  Charges:  $Gait Training: 8-22 mins                    G Codes:       Deniece Ree PT, DPT, CBIS  Supplemental Physical Therapist [place]   Pager [phone]

## 2018-03-01 NOTE — Progress Notes (Signed)
Occupational Therapy Treatment Patient Details Name: John Sampson MRN: [id number] DOB: 1936-11-06 Today's Date: 03/01/2018    History of present illness Pt is a 82 y.o. 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  Also with total thyroidectomy and laryngoplasty.  He presented to ENT with increasing SOB and hoarseness.  He was taken to the OR on 3/15 by Dr. Lucia Gaskins for injection of left vocal cord to strengthen his voice.   He presented today to Dr. Pollie Friar office and there was concern for dehydration and dysphagia so he was sent to [facility]. Went home after this but was still unable to swallow so returned and now has NG tube - now s/p PEG. -CT neck 3/27 fairly extensive recurrence of his thyroid cancer adjacent to the trachea as well as into the anterior wall of his esophagus and probable lung mets    OT comments  Pt making progress towards occupational therapy intervention. Pt reports not sleeping well but agreeable to OT  while seated in chair. Skilled OT with focus on B UE therapeutic exercise, endurance, and pursed lip breathing. O2 saturation remained at 95-99% while on 2 L O2 via [place]. Pt able to perform alternating punches, chest pulls, shoulder flexion, and shoulder diagonals with level 1 thereband with reps of 10. Rest breaks between each set secondary to fatigue. Pt continues to benefit from OT intervention.  Follow Up Recommendations  SNF;Supervision/Assistance - 24 hour    Equipment Recommendations  Other (comment)(defer to next venue of care)    Recommendations for Other Services Other (comment)(none at this time)    Precautions / Restrictions Precautions Precautions: Fall Restrictions Weight Bearing Restrictions: No Other Position/Activity Restrictions: PEG tube       Mobility Bed Mobility     General bed mobility comments: seated in recliner chair upon arrival         ADL either performed or assessed with  clinical judgement        Vision Baseline Vision/History: Wears glasses Wears Glasses: At all times Patient Visual Report: No change from baseline            Cognition Arousal/Alertness: Awake/alert Behavior During Therapy: Flat affect Overall Cognitive Status: Within Functional Limits for tasks assessed                      Pertinent Vitals/ Pain       Pain Assessment: Faces Faces Pain Scale: No hurt         Frequency  Min 2X/week        Progress Toward Goals  OT Goals(current goals can now be found in the care plan section)  Progress towards OT goals: Progressing toward goals     Plan Discharge plan remains appropriate       AM-PAC PT "6 Clicks" Daily Activity     Outcome Measure   Help from another person eating meals?: None Help from another person taking care of personal grooming?: A Little Help from another person toileting, which includes using toliet, bedpan, or urinal?: A Little Help from another person bathing (including washing, rinsing, drying)?: A Little Help from another person to put on and taking off regular upper body clothing?: A Little Help from another person to put on and taking off regular lower body clothing?: A Little 6 Click Score: 19    End of Session Equipment Utilized During Treatment: Oxygen  OT Visit Diagnosis: Unsteadiness on feet (R26.81);Other abnormalities of gait  and mobility (R26.89);Muscle weakness (generalized) (M62.81)   Activity Tolerance Patient tolerated treatment well   Patient Left in chair;with call bell/phone within reach   Nurse Communication          Time: 0110-0349 OT Time Calculation (min): 24 min  Charges: OT General Charges $OT Visit: 1 Visit OT Treatments $Therapeutic Exercise: 23-37 mins    Alahna Dunne P, MS, OTR/L 03/01/2018, 9:42 AM

## 2018-03-01 NOTE — Progress Notes (Signed)
  Speech Language Pathology Treatment: Dysphagia  Patient Details Name: John Sampson MRN: [id number] DOB: Mar 01, 1936 Today's Date: 03/01/2018 Time: 7628-3151 SLP Time Calculation (min) (ACUTE ONLY): 10 min  Assessment / Plan / Recommendation Clinical Impression  Pt seated in recliner with son present upon arrival of SLP. Lunch had arrived, but was across the room from pt. Pt reported he was tired, but was willing to eat some of his lunch. PEG feeds ongoing. Pt appeared to tolerate trials of puree and nectar thick liquid, and was observed to turn his head to the left with each swallow, clear his throat occasionally, and swallow at least twice per bolus, as safe swallow precautions recommend. MBS results were reviewed with pt/son, and SLP provided opportunity to ask questions. Rationale for compensatory positions/techniques were reviewed with pt. Pt was encouraged to continue with po intake to continue to strengthen oropharyngeal musculature for safe effective swallow. Precautions per MBS posted at [facility].   HPI HPI: CALVERT CHARLAND a 82 y.o.malewith medical history significant ofleft papillary thyroid carcinoma with paralysis of left vocal cord. Also with total thyroidectomy and laryngoplasty. He presented to [facility] increasing SOB and hoarseness. He was taken to the OR on 3/15 by Dr. Lucia Gaskins for injection of left vocal cord to strengthen his voice. He presented today to Dr. Pollie Friar office and there was concern for dehydration and dysphagia so he was sent to [facility] for direct admission (actually observation). Since the procedure, he has been able to have any solid or liquid food - it just won't go down or it goes down and comes right back up. Also unable to swallow any pills. He had the procedure Friday and did fine. He was told to go home with a soft diet and he was able to eat and drink that night. He ate breakfast ok Saturday but he wasn't able to eat after that. Dr. Shirlee More that on  in-office exam the vocal cords are clear without swelling or infection but the patient was unable to swallow water in his office. He recommends IVF, labs, modified barium swallow evaluationfor esophageal obstruction/aspiration.      SLP Plan  Continue with current plan of care       Recommendations  Diet recommendations: Nectar-thick liquid;Dysphagia 1 (puree) Liquids provided via: Cup;No straw Medication Administration: Crushed with puree Supervision: Patient able to self feed;Intermittent supervision to cue for compensatory strategies Compensations: Multiple dry swallows after each bite/sip;Effortful swallow;Other (Comment);Clear throat intermittently(head turn left) Postural Changes and/or Swallow Maneuvers: Head turn left during swallow;Seated upright 90 degrees                Oral Care Recommendations: Oral care BID Follow up Recommendations: (TBD) SLP Visit Diagnosis: Dysphagia, pharyngoesophageal phase (R13.14) Plan: Continue with current plan of care       GO               Celia B. Quentin Ore [facility], CCC-SLP Speech Language Pathologist [phone]  Shonna Chock 03/01/2018, 2:12 PM

## 2018-03-01 NOTE — Progress Notes (Signed)
PROGRESS NOTE  John Sampson QMG:[id number] DOB: 21-Nov-1936 DOA: 02/14/2018 PCP: Sinda Du, MD  HPI/Recap of past 24 hours:  John Sampson is a 82 y.o. 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 CT neck imaging confirmed extensive involvement of recurrent thyroid cancer ( esophagus, trachea, probable lung lesions as well). PEG placed on 3/29 by IR with tube feeds and dysphagia 1 diet ( pureed)   Interval History Complaints of fatigue. Nurse noticed some bleeding at PEG site captured by binder. Dressing to abdomen has since been changed  Assessment/Plan: Principal Problem:   Dysphagia Active Problems:   Essential hypertension, benign   Mixed hyperlipidemia   PAF (paroxysmal atrial fibrillation) (HCC)   Chronic diastolic congestive heart failure (HCC)   Hypothyroidism (acquired)   Abrasion of lower leg, left, initial encounter   Protein-calorie malnutrition, severe  #Recurrent left papillary thyroid cancer. Involving trachea, esophagus, ct imaging concerning for possible metastatic lesions to lungs. ENT aware, plan for patient to rehabilitate physically and nutritionally before discussing treatment options  #Dysphagia secondary to cervical esophageal swelling s/p PEG- Recurrence of thyroid cancer with extensive involvement with esophagus.   #Vocal cord Paralysis. Presumed related to recurrent cancer. S/p intraoperative injection prior to admission on 3/15 ( dr. Lucia Gaskins)  #Paroxysmal Atrial fibrillation-rate controlled, lopressor 50 mg BID, lovenox for now, unsure  if can tolerate home eliquis with dysphagia ( discuss with pharamacy). Has not required home diltiazem  #Hypothyroidism ( s/p thyroidectomy as mentioned above)- synthroid decreased to 75 mcg on admission as TSH was suppressed ( 0.024) will need repeat TSH as outpatient  #Hypertension. Holding home lisinopril, lasix. Lopressor 50 mg BID  #Chronic diastolic heart failure, no overt signs of hypervolemia on exam. Keep monitoring while holding diuretics  Code Status: DNR   Family Communication: No family at bedside  Disposition Plan: Plan for likely SNF placement on 4/4    Consultants:  ENT   Antimicrobials:  Zosyn 3/25-3/30  Unasyn 3/20- 3/24 Cultures:  None  Telemetry:no  DVT prophylaxis:  Lovenox   Objective: Vitals:   02/28/18 1056 02/28/18 1300 02/28/18 2117 03/01/18 0507  BP: 140/60 136/62 (!) 137/51 134/62  Pulse: 64 62 (!) 53 64  Resp:  16 16 17   Temp:  98.2 F (36.8 C) 98.3 F (36.8 C) 98.4 F (36.9 C)  TempSrc:  Oral Oral Oral  SpO2:  99% 100% 100%  Weight:    85 kg (187 lb 6.3 oz)  Height:        Intake/Output Summary (Last 24 hours) at 03/01/2018 1157 Last data filed at 02/28/2018 1700 Gross per 24 hour  Intake 556.5 ml  Output 600 ml  Net -43.5 ml   Filed Weights   02/25/18 0655 02/28/18 0800 03/01/18 0507  Weight: 85.4 kg (188 lb 4.4 oz) 85 kg (187 lb 6.3 oz) 85 kg (187 lb 6.3 oz)    Exam:  Constitutional:normal appearing male Eyes: EOMI, anicteric, normal conjunctivae Neck: no appreciable thyromegaly Cardiovascular: RRR no MRGs, with no peripheral edema Respiratory: Normal respiratory effort on 2 L [place], clear breath sounds  Abdomen: Soft,non-tender, with no HSM Skin: c/d/i  bandage of abdomen with PEG tube draining  Neurologic: Grossly no focal neuro deficit. Psychiatric:Appropriate affect, Mental status AAOx4  Data Reviewed: CBC: Recent Labs  Lab 02/23/18 0406 02/25/18 0707 02/27/18 0451 02/28/18 0427  WBC 24.6* 14.6* 13.1* 12.9*   HGB 12.0* 11.7* 10.8* 10.1*  HCT 38.9* 38.3* 34.9* 34.2*  MCV 83.3 85.1 84.7 85.9  PLT 254 219 235 144   Basic Metabolic Panel: Recent Labs  Lab 02/23/18 0406 02/25/18 0707 02/27/18 0451  NA 134* 134* 134*  K 3.8 4.2 3.8  CL 96* 102 100*  CO2 28 25 28   GLUCOSE 130* 98 123*  BUN 43* 24* 23*  CREATININE 0.98 0.97 0.83  CALCIUM 8.7* 8.5* 8.8*   GFR: Estimated Creatinine Clearance: 76.7 mL/min (by C-G formula based on SCr of 0.83 mg/dL). Liver Function Tests: No results for input(s): AST, ALT, ALKPHOS, BILITOT, PROT, ALBUMIN in the last 168 hours. No results for input(s): LIPASE, AMYLASE in the last 168 hours. No results for input(s): AMMONIA in the last 168 hours. Coagulation Profile: Recent Labs  Lab 02/23/18 2053  INR 1.00   Cardiac Enzymes: No results for input(s): CKTOTAL, CKMB, CKMBINDEX, TROPONINI in the last 168 hours. BNP (last 3 results) No results for input(s): PROBNP in the last 8760 hours. HbA1C: No results for input(s): HGBA1C in the last 72 hours. CBG: Recent Labs  Lab 02/28/18 1621 02/28/18 1959 03/01/18 0034 03/01/18 0354 03/01/18 0900  GLUCAP 101* 107* 97 106* 108*   Lipid Profile: No results for input(s): CHOL, HDL, LDLCALC, TRIG, CHOLHDL, LDLDIRECT in the last 72 hours. Thyroid Function Tests: No results for input(s): TSH, T4TOTAL, FREET4, T3FREE, THYROIDAB in the last 72 hours. Anemia Panel: No results for input(s): VITAMINB12, FOLATE, FERRITIN, TIBC, IRON, RETICCTPCT in the last 72 hours. Urine analysis:    Component Value Date/Time   COLORURINE YELLOW 12/06/2017 1215   APPEARANCEUR HAZY (A) 12/06/2017 1215   LABSPEC 1.021 12/06/2017 1215   PHURINE 5.0 12/06/2017 1215   GLUCOSEU NEGATIVE 12/06/2017 1215   HGBUR LARGE (A) 12/06/2017 1215   BILIRUBINUR NEGATIVE 12/06/2017 1215   KETONESUR NEGATIVE 12/06/2017 1215   PROTEINUR 30 (A) 12/06/2017 1215   NITRITE NEGATIVE 12/06/2017 1215   LEUKOCYTESUR TRACE (A) 12/06/2017 1215   Sepsis  Labs: @LABRCNTIP (procalcitonin:4,lacticidven:4)  )No results found for this or any previous visit (from the past 240 hour(s)).    Studies: No results found.  Scheduled Meds: . enoxaparin (LOVENOX) injection  1 mg/kg Subcutaneous Q12H  . feeding supplement (PRO-STAT SUGAR FREE 64)  30 mL Per Tube BID  . free water  200 mL Per Tube QID  . levothyroxine  75 mcg Oral QAC breakfast  . mouth rinse  15 mL Mouth Rinse BID  . metoprolol tartrate  50 mg Oral BID  .  morphine injection  1 mg Intravenous Once  . mupirocin ointment   Topical Daily    Continuous Infusions: . feeding supplement (JEVITY 1.2 CAL) 1,000 mL (03/01/18 0241)     LOS: 14 days     Desiree Hane, MD [facility] Pager [phone]  If 7PM-7AM, please contact night-coverage [email or web address] Password [facility] 03/01/2018, 11:57 AM

## 2018-03-02 DIAGNOSIS — I472 Ventricular tachycardia: Secondary | ICD-10-CM | POA: Diagnosis present

## 2018-03-02 DIAGNOSIS — E039 Hypothyroidism, unspecified: Secondary | ICD-10-CM | POA: Diagnosis not present

## 2018-03-02 DIAGNOSIS — J441 Chronic obstructive pulmonary disease with (acute) exacerbation: Secondary | ICD-10-CM | POA: Diagnosis present

## 2018-03-02 DIAGNOSIS — R748 Abnormal levels of other serum enzymes: Secondary | ICD-10-CM | POA: Diagnosis not present

## 2018-03-02 DIAGNOSIS — E43 Unspecified severe protein-calorie malnutrition: Secondary | ICD-10-CM | POA: Diagnosis not present

## 2018-03-02 DIAGNOSIS — R0600 Dyspnea, unspecified: Secondary | ICD-10-CM | POA: Diagnosis not present

## 2018-03-02 DIAGNOSIS — Z4659 Encounter for fitting and adjustment of other gastrointestinal appliance and device: Secondary | ICD-10-CM | POA: Diagnosis not present

## 2018-03-02 DIAGNOSIS — Z79890 Hormone replacement therapy (postmenopausal): Secondary | ICD-10-CM | POA: Diagnosis not present

## 2018-03-02 DIAGNOSIS — Z79899 Other long term (current) drug therapy: Secondary | ICD-10-CM | POA: Diagnosis not present

## 2018-03-02 DIAGNOSIS — R2681 Unsteadiness on feet: Secondary | ICD-10-CM | POA: Diagnosis not present

## 2018-03-02 DIAGNOSIS — R0902 Hypoxemia: Secondary | ICD-10-CM | POA: Diagnosis not present

## 2018-03-02 DIAGNOSIS — I509 Heart failure, unspecified: Secondary | ICD-10-CM | POA: Diagnosis not present

## 2018-03-02 DIAGNOSIS — Z66 Do not resuscitate: Secondary | ICD-10-CM | POA: Diagnosis present

## 2018-03-02 DIAGNOSIS — G479 Sleep disorder, unspecified: Secondary | ICD-10-CM | POA: Diagnosis not present

## 2018-03-02 DIAGNOSIS — G629 Polyneuropathy, unspecified: Secondary | ICD-10-CM | POA: Diagnosis not present

## 2018-03-02 DIAGNOSIS — J3800 Paralysis of vocal cords and larynx, unspecified: Secondary | ICD-10-CM | POA: Diagnosis not present

## 2018-03-02 DIAGNOSIS — R600 Localized edema: Secondary | ICD-10-CM | POA: Diagnosis not present

## 2018-03-02 DIAGNOSIS — E785 Hyperlipidemia, unspecified: Secondary | ICD-10-CM | POA: Diagnosis present

## 2018-03-02 DIAGNOSIS — R1310 Dysphagia, unspecified: Secondary | ICD-10-CM | POA: Diagnosis not present

## 2018-03-02 DIAGNOSIS — R0609 Other forms of dyspnea: Secondary | ICD-10-CM | POA: Diagnosis not present

## 2018-03-02 DIAGNOSIS — E872 Acidosis: Secondary | ICD-10-CM | POA: Diagnosis not present

## 2018-03-02 DIAGNOSIS — R1314 Dysphagia, pharyngoesophageal phase: Secondary | ICD-10-CM | POA: Diagnosis not present

## 2018-03-02 DIAGNOSIS — J9621 Acute and chronic respiratory failure with hypoxia: Secondary | ICD-10-CM | POA: Diagnosis not present

## 2018-03-02 DIAGNOSIS — I2510 Atherosclerotic heart disease of native coronary artery without angina pectoris: Secondary | ICD-10-CM | POA: Diagnosis present

## 2018-03-02 DIAGNOSIS — I5033 Acute on chronic diastolic (congestive) heart failure: Secondary | ICD-10-CM | POA: Diagnosis not present

## 2018-03-02 DIAGNOSIS — J383 Other diseases of vocal cords: Secondary | ICD-10-CM | POA: Diagnosis present

## 2018-03-02 DIAGNOSIS — I340 Nonrheumatic mitral (valve) insufficiency: Secondary | ICD-10-CM | POA: Diagnosis not present

## 2018-03-02 DIAGNOSIS — Z85850 Personal history of malignant neoplasm of thyroid: Secondary | ICD-10-CM | POA: Diagnosis not present

## 2018-03-02 DIAGNOSIS — E890 Postprocedural hypothyroidism: Secondary | ICD-10-CM | POA: Diagnosis present

## 2018-03-02 DIAGNOSIS — I110 Hypertensive heart disease with heart failure: Secondary | ICD-10-CM | POA: Diagnosis present

## 2018-03-02 DIAGNOSIS — E1142 Type 2 diabetes mellitus with diabetic polyneuropathy: Secondary | ICD-10-CM | POA: Diagnosis present

## 2018-03-02 DIAGNOSIS — R0682 Tachypnea, not elsewhere classified: Secondary | ICD-10-CM | POA: Diagnosis not present

## 2018-03-02 DIAGNOSIS — E1151 Type 2 diabetes mellitus with diabetic peripheral angiopathy without gangrene: Secondary | ICD-10-CM | POA: Diagnosis present

## 2018-03-02 DIAGNOSIS — M6281 Muscle weakness (generalized): Secondary | ICD-10-CM | POA: Diagnosis not present

## 2018-03-02 DIAGNOSIS — I25810 Atherosclerosis of coronary artery bypass graft(s) without angina pectoris: Secondary | ICD-10-CM | POA: Diagnosis not present

## 2018-03-02 DIAGNOSIS — I252 Old myocardial infarction: Secondary | ICD-10-CM | POA: Diagnosis not present

## 2018-03-02 DIAGNOSIS — J438 Other emphysema: Secondary | ICD-10-CM | POA: Diagnosis not present

## 2018-03-02 DIAGNOSIS — I480 Paroxysmal atrial fibrillation: Secondary | ICD-10-CM | POA: Diagnosis not present

## 2018-03-02 DIAGNOSIS — Z7901 Long term (current) use of anticoagulants: Secondary | ICD-10-CM | POA: Diagnosis not present

## 2018-03-02 DIAGNOSIS — I10 Essential (primary) hypertension: Secondary | ICD-10-CM | POA: Diagnosis not present

## 2018-03-02 DIAGNOSIS — Z931 Gastrostomy status: Secondary | ICD-10-CM | POA: Diagnosis not present

## 2018-03-02 DIAGNOSIS — R262 Difficulty in walking, not elsewhere classified: Secondary | ICD-10-CM | POA: Diagnosis not present

## 2018-03-02 DIAGNOSIS — Z8249 Family history of ischemic heart disease and other diseases of the circulatory system: Secondary | ICD-10-CM | POA: Diagnosis not present

## 2018-03-02 DIAGNOSIS — J9601 Acute respiratory failure with hypoxia: Secondary | ICD-10-CM | POA: Diagnosis not present

## 2018-03-02 DIAGNOSIS — C73 Malignant neoplasm of thyroid gland: Secondary | ICD-10-CM | POA: Diagnosis not present

## 2018-03-02 DIAGNOSIS — R05 Cough: Secondary | ICD-10-CM | POA: Diagnosis not present

## 2018-03-02 DIAGNOSIS — K219 Gastro-esophageal reflux disease without esophagitis: Secondary | ICD-10-CM | POA: Diagnosis present

## 2018-03-02 DIAGNOSIS — Z951 Presence of aortocoronary bypass graft: Secondary | ICD-10-CM | POA: Diagnosis not present

## 2018-03-02 DIAGNOSIS — J80 Acute respiratory distress syndrome: Secondary | ICD-10-CM | POA: Diagnosis not present

## 2018-03-02 DIAGNOSIS — Z87891 Personal history of nicotine dependence: Secondary | ICD-10-CM | POA: Diagnosis not present

## 2018-03-02 DIAGNOSIS — R0603 Acute respiratory distress: Secondary | ICD-10-CM | POA: Diagnosis not present

## 2018-03-02 LAB — GLUCOSE, CAPILLARY
GLUCOSE-CAPILLARY: 112 mg/dL — AB (ref 65–99)
GLUCOSE-CAPILLARY: 97 mg/dL (ref 65–99)
Glucose-Capillary: 113 mg/dL — ABNORMAL HIGH (ref 65–99)
Glucose-Capillary: 114 mg/dL — ABNORMAL HIGH (ref 65–99)

## 2018-03-02 MED ORDER — MIRTAZAPINE 15 MG PO TABS: 15.0000 mg | ORAL_TABLET | Freq: Every day | ORAL | Status: DC

## 2018-03-02 MED ORDER — DILTIAZEM HCL ER COATED BEADS 240 MG PO CP24: ORAL_CAPSULE | ORAL | 3 refills | Status: DC

## 2018-03-02 MED ORDER — METOPROLOL SUCCINATE ER 50 MG PO TB24: ORAL_TABLET | ORAL | Status: DC

## 2018-03-02 MED ORDER — WHITE PETROLATUM GEL: 1.0000 "application " | 0 refills | Status: DC | PRN

## 2018-03-02 MED ORDER — FUROSEMIDE 20 MG PO TABS: ORAL_TABLET | ORAL | 3 refills | Status: DC

## 2018-03-02 MED ORDER — JEVITY 1.2 CAL PO LIQD: 1000.0000 mL | ORAL | 0 refills | Status: DC

## 2018-03-02 MED ORDER — NITROGLYCERIN 0.4 MG SL SUBL: 0.4000 mg | SUBLINGUAL_TABLET | SUBLINGUAL | 9 refills | Status: DC | PRN

## 2018-03-02 MED ORDER — APIXABAN 5 MG PO TABS: 5.0000 mg | ORAL_TABLET | Freq: Two times a day (BID) | ORAL | 3 refills | Status: DC

## 2018-03-02 MED ORDER — LEVOTHYROXINE SODIUM 75 MCG PO TABS: 75.0000 ug | ORAL_TABLET | Freq: Every day | ORAL | Status: DC

## 2018-03-02 MED ORDER — FREE WATER: 200.0000 mL | Freq: Four times a day (QID) | Status: DC

## 2018-03-02 MED ORDER — LISINOPRIL 40 MG PO TABS: ORAL_TABLET | ORAL | 3 refills | Status: DC

## 2018-03-02 MED ORDER — OMEPRAZOLE 20 MG PO CPDR: 20.0000 mg | DELAYED_RELEASE_CAPSULE | Freq: Two times a day (BID) | ORAL | Status: DC

## 2018-03-02 MED ORDER — PRO-STAT SUGAR FREE PO LIQD: 30.0000 mL | Freq: Two times a day (BID) | ORAL | 0 refills | Status: DC

## 2018-03-02 MED ORDER — SIMVASTATIN 40 MG PO TABS: 20.0000 mg | ORAL_TABLET | Freq: Every day | ORAL | 12 refills | Status: DC

## 2018-03-02 MED ORDER — ALBUTEROL SULFATE HFA 108 (90 BASE) MCG/ACT IN AERS: 2.0000 | INHALATION_SPRAY | Freq: Four times a day (QID) | RESPIRATORY_TRACT | 3 refills | Status: DC | PRN

## 2018-03-02 MED ORDER — METOPROLOL TARTRATE 50 MG PO TABS: 50.0000 mg | ORAL_TABLET | Freq: Two times a day (BID) | ORAL | Status: DC

## 2018-03-02 MED ORDER — FOLIC ACID 1 MG PO TABS: 1.0000 mg | ORAL_TABLET | Freq: Every day | ORAL | Status: DC

## 2018-03-02 MED ORDER — RESOURCE THICKENUP CLEAR PO POWD: 2.0000 | ORAL | Status: DC | PRN

## 2018-03-02 NOTE — Discharge Summary (Signed)
Discharge Summary  LESS WOOLSEY TKZ:[id number] DOB: 05-17-1936  PCP: Sinda Du, MD  Admit date: 02/14/2018 Discharge date: 03/02/2018  Time spent: < 25 minutes  Admitted From: Home Disposition: SNF, [facility]  Recommendations for Outpatient Follow-up:  1. Follow up with PCP in 1-2 weeks 2. Hold diltiazem, Lasix, lisinopril, metoprolol succinate (Toprol), evaluate to see if blood pressure and heart rate tolerates.  Patient did not require these medications during hospitalization 3. Patient will need to follow-up with Dr. Lucia Gaskins (ENT) and radiation oncology after getting stronger and rehab to ensure improvement functionally nutritionally before starting any further treatment  Equipment/Devices: 2 L oxygen  Discharge Diagnoses:  Active Hospital Problems   Diagnosis Date Noted  . Dysphagia 02/14/2018  . Protein-calorie malnutrition, severe 02/17/2018  . Hypothyroidism (acquired) 02/14/2018  . Abrasion of lower leg, left, initial encounter 02/14/2018  . Chronic diastolic congestive heart failure ([place]) 07/24/2016  . PAF (paroxysmal atrial fibrillation) ([place]) 12/24/2015  . Essential hypertension, benign 08/03/2012  . Mixed hyperlipidemia 08/03/2012    Resolved Hospital Problems  No resolved problems to display.    Discharge Condition: Stable  CODE STATUS: DNR  Diet recommendation: Directly from Speech Therapist Evaluation Dysphagia 1(puree), Nectar Thick liquid Liquids provided via: Cup;No straw Medication Administration: Crushed with puree Supervision: Patient able to self feed;Intermittent supervision to cue for compensatory strategies  Vitals:   03/02/18 0437 03/02/18 0941  BP: (!) 148/56 (!) 123/49  Pulse: (!) 59 (!) 49  Resp: 16   Temp: 98.5 F (36.9 C)   SpO2: 99% 99%    History of present illness:  John Sampson is a 82 y.o. 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   Hospital Course:   Principal Problem:   Dysphagia Active Problems:   Essential hypertension, benign   Mixed hyperlipidemia   PAF (paroxysmal atrial fibrillation) (HCC)   Chronic diastolic congestive heart failure (HCC)   Hypothyroidism (acquired)   Abrasion of lower leg, left, initial encounter   Protein-calorie malnutrition, severe  #Dysphagia secondary to esophageal swelling Patient presented with increased hoarseness difficulty swallowing a few days after scheduled microlaryngoscopy with injection of left vocal cord for known vocal cord paralysis.  Modified barium swallow demonstrated swelling and obstruction of the upper cervical esophagus.  She was given antibiotics and Decadron with mild improvement on repeat swallow study.  Persistent dysphagia was evaluated by CT neck which showed fairly extensive recurrence of his thyroid cancer in addition to the trachea and esophagus.  PEG tube was placed on 3/29 by IR with tube feeds and patient tolerated dysphagia 1 diet (pured)  #Recurrent left papillary thyroid cancer.   CT imaging shows involvement of trachea and esophagus with possible metastatic lesions to lungs.  ENT was consulted during hospitalization, plan for patient to rehabilitate physically nutritionally before discussing treatment options as an outpatient.    #Vocal cord paralysis.   Presume related to recurrent cancer.  Status post intraoperative injection prior to admission on 3/15 by Dr. Lucia Gaskins with ENT  #Paroxysmal atrial fibrillation Remained rate controlled throughout hospitalization.  Patient did not require his home diltiazem.  We will continue to hold off on that as an outpatient.  Continue Lopressor.  Resume home Eliquis(  can crush  and give with food, not for peg tube) on discharge.  If rate control becomes elusive would advise slowly restarting previous diltiazem  #Hypothyroidism Status post thyroidectomy.  Continue Synthroid 75 mcg on discharge.  Patient will need repeat TSH in 4-6 weeks (TSH on admission was suppressed and Synthroid dose was decreased from 125 to 75)  #Hypertension.  Has remained controlled on Lopressor therapy alone.  We have held home lisinopril, Toprol, Lasix.  Resume as needed depending on blood pressure on discharge  Antibiotics:  Zosyn 3/25-3/30  Unasyn 3/20- 3/24  Microbiology:  Consultations: ENT, interventional radiology  Ct Abdomen Wo Contrast  Result Date: 02/23/2018 CLINICAL DATA:  Dysphagia, aspiration risk, assess anatomy for gastrostomy insertion EXAM: CT ABDOMEN WITHOUT CONTRAST TECHNIQUE: Multidetector CT imaging of the abdomen was performed following the standard protocol without IV contrast. COMPARISON:  02/22/2018 modified swallow FINDINGS: Lower chest: Several bilateral lower chest pulmonary nodules all measuring 5 mm or less in size within the lower lobes, inferior right middle lobe, and lingula Right basilar atelectasis versus scarring also noted. Normal heart size. Native coronary atherosclerosis. Previous median sternotomy evident. Feeding tube extends into the stomach distally. Hepatobiliary: Cholelithiasis evident. No focal hepatic abnormality. No biliary dilatation or obstruction. Pancreas: No focal abnormality or ductal dilatation. No surrounding inflammatory process. Spleen: Normal in size. Adrenals/Urinary Tract: Normal adrenal glands. Kidneys demonstrate no acute obstructive uropathy or hydronephrosis. Ureters are symmetric and decompressed. Mild chronic perinephric strandy edema. Indeterminate posterior left renal hyperdense cortical lesion measures 10 mm this may represent a proteinaceous or hemorrhagic cyst. Stomach/Bowel: Feeding tube enters the stomach. No  significant hiatal hernia. Stomach anatomy appears favorable for fluoroscopic gastrostomy technique. Stomach is inferior to the left hepatic lobe and the midportion of the stomach is superior to the transverse colon. Feeding tube extends into the duodenal bulb. Stomach is currently decompressed. Vascular/Lymphatic: Aortic atherosclerosis noted. Negative for aneurysm. No retroperitoneal hemorrhage or hematoma. No abdominal adenopathy. Other: No abdominal wall hernia. No free fluid or ascites. Negative for abscess. Musculoskeletal: No acute osseous finding. Anterior L2 vertebral body lytic lesion without compression pathologic fracture. Appearance is suspicious for metastatic disease or myeloma. IMPRESSION: Stomach anatomy appears favorable for attempt at fluoroscopic gastrostomy insertion. Numerous subcentimeter bilateral pulmonary nodules suspicious for pulmonary metastases. Lytic L2 vertebral body suspicious for osseous metastasis. Atherosclerosis without aneurysm Cholelithiasis Electronically Signed   By: Jerilynn Mages.  Shick M.D.   On: 02/23/2018 16:06   Dg Chest 2 View  Result Date: 02/18/2018 CLINICAL DATA:  Leukocytosis. EXAM: CHEST - 2 VIEW COMPARISON:  Chest x-ray dated December 06, 2017. FINDINGS: Feeding tube noted with the tip likely in the distal stomach. Stable cardiomegaly status post CABG. Normal pulmonary vascularity. Emphysema. No focal consolidation, pleural effusion, or pneumothorax. Unchanged elevation of the right hemidiaphragm. No acute osseous abnormality. IMPRESSION: 1. COPD.  No active cardiopulmonary disease. Electronically Signed   By: Titus Dubin M.D.   On: 02/18/2018 11:37   Ct Soft Tissue Neck W Contrast  Result Date: 02/22/2018 CLINICAL DATA:  Improving dysphagia. Status post laryngoscopy. History of thyroid cancer, thyroidectomy, laryngoplasty, pituitary macroadenoma. EXAM: CT NECK WITH CONTRAST TECHNIQUE: Multidetector CT imaging of the neck was performed using the standard protocol  following the bolus administration of intravenous contrast. CONTRAST:  40mL ISOVUE-300 IOPAMIDOL (ISOVUE-300) INJECTION 61% COMPARISON:  CT neck August 25, 2016 FINDINGS: PHARYNX AND LARYNX: Focally sclerotic LEFT thyroid and cricoid cartilages consistent with tumoral involvement. Mild medial deviation RIGHT true vocal cord could reflect paralysis. Normal pharynx. SALIVARY GLANDS: Normal. THYROID:  Status post thyroidectomy. 21 x 14 mm abnormal soft tissue LEFT posterior thyroid bed and appears to invade the anterior status soft [place]. Additional subcentimeter nodules inferior LEFT thyroid bed. LYMPH NODES: 10 mm round LEFT level 4 lymph node. 9 mm LEFT level 7 lymph node with additional smaller bilateral 7 lymph nodes. VASCULAR: Moderate calcific atherosclerosis aortic arch. Moderate calcific atherosclerosis carotid bifurcations. LIMITED INTRACRANIAL: Normal. VISUALIZED ORBITS: Status post bilateral ocular lens implants. MASTOIDS AND VISUALIZED PARANASAL SINUSES: LEFT nasogastric tube. SKELETON: Nonacute.  Patient is edentulous. UPPER CHEST: Centrilobular emphysema. Multiple LEFT pulmonary nodules measuring to at least 11 mm, some of which are new from prior CT. Moderate centrilobular emphysema. Status post median sternotomy. OTHER: None. IMPRESSION: 1. Status post thyroidectomy with 21 x 14 mm mass LEFT thyroid bed consistent with invasive residual versus recurrent tumor (including suspected esophageal invasion). 2. New pathologic level for lymphadenopathy with probable additional smaller alignment lymph nodes LEFT level 7. 3. Multiple pulmonary nodules, some which are new from prior CT most compatible with progressive metastasis. Aortic Atherosclerosis (ICD10-I70.0) and Emphysema (ICD10-J43.9). Electronically Signed   By: Elon Alas M.D.   On: 02/22/2018 21:14   Ir Gastrostomy Tube Mod Sed  Result Date: 02/24/2018 INDICATION: Thyroid cancer, dysphagia, malnutrition EXAM: FLUOROSCOPIC 20 FRENCH  PULL-THROUGH GASTROSTOMY Date:  02/24/2018 02/24/2018 1:43 pm Radiologist:  Jerilynn Mages. Daryll Brod, MD Guidance:  Fluoroscopic MEDICATIONS: 3.375 g Zosyn; Antibiotics were administered within 1 hour of the procedure. Glucagon 0.5 mg IV ANESTHESIA/SEDATION: Versed 1.5 mg IV; Fentanyl 75 mcg IV Moderate Sedation Time:  11 minutes The patient was continuously monitored during the procedure by the interventional radiology nurse under my direct supervision. CONTRAST:  10 cc Isovue-administered into the gastric lumen. FLUOROSCOPY TIME:  Fluoroscopy Time: 1 minutes 18 seconds (12 mGy). COMPLICATIONS: None immediate. PROCEDURE: Informed consent was obtained from the patient following explanation of the procedure, risks, benefits and alternatives. The patient understands, agrees and consents for the procedure. All questions were addressed. A time out was performed. Maximal barrier sterile technique utilized including caps, mask, sterile gowns, sterile gloves, large sterile drape, hand hygiene, and betadine prep. The left upper quadrant was sterilely prepped and draped. An oral gastric catheter was inserted into the stomach under fluoroscopy. The existing nasogastric feeding tube was removed. Air was injected into the stomach for insufflation and visualization under fluoroscopy. The air distended stomach was confirmed beneath the anterior abdominal wall in the frontal and lateral projections. Under sterile conditions and local anesthesia, a 42 gauge trocar needle was utilized to access the stomach percutaneously beneath the left subcostal margin. Needle position was confirmed within the stomach under biplane fluoroscopy. Contrast injection confirmed position also. A single T tack was deployed for gastropexy. Over an Amplatz guide wire, a 9-French sheath was inserted into the stomach. A snare device was utilized to capture the oral gastric catheter. The snare device was pulled retrograde from the stomach up the esophagus and out the  oropharynx. The 20-French pull-through gastrostomy was connected to the snare device and pulled antegrade through the oropharynx down the esophagus into the stomach and then through the percutaneous tract external to the patient. The gastrostomy was assembled externally. Contrast injection confirms position in the stomach. Images were obtained for documentation. The patient tolerated procedure well. No immediate complication. IMPRESSION: Fluoroscopic insertion of a 20-French "pull-through" gastrostomy. Electronically Signed   By: Jerilynn Mages.  Shick M.D.   On: 02/24/2018 13:45   Dg Chest Port 1 View  Result Date: 02/22/2018 CLINICAL DATA:  Dyspnea.  History of COPD, previous MI, atrial flutter, chronic CHF. Former smoker. EXAM: PORTABLE CHEST 1 VIEW COMPARISON:  PA and lateral chest x-ray of February 18, 2018. FINDINGS: The lungs are adequately inflated and clear. The heart and pulmonary vascularity are normal. The mediastinum is normal in width. There are post CABG changes. There is calcification in the wall of the aortic arch. IMPRESSION: Previous CABG. No pneumonia, CHF, nor other acute cardiopulmonary abnormality. Electronically Signed   By: David  Martinique M.D.   On: 02/22/2018 14:22   Dg Addison Bailey G Tube Plc W/fl W/rad  Result Date: 02/15/2018 CLINICAL DATA:  Unable to swallow.  Need for feeding tube EXAM: NASO G TUBE PLACEMENT WITH FL AND WITH RAD CONTRAST:  10 mL Isovue contrast injected to the feeding tube. FLUOROSCOPY TIME:  Fluoroscopy Time:  8 minutes 4 seconds Radiation Exposure Index (if provided by the fluoroscopic device): Number of Acquired Spot Images: 0 COMPARISON:  Speech pathology study 02/15/2018 FINDINGS: The technologist initially attempted to place the tube, and had difficulty passing the tube through the pharynx into the esophagus. My presence was requested. After some effort, the tube was advanced into the stomach using a Glidewire. The tube was placed in the antrum however I was not able to get the  tube into the duodenum despite multiple attempts with the tube and wire. Tube was left in the gastric antrum IMPRESSION: Feeding tube placed with the tip in the gastric antrum. I was not able to get the tube into the duodenum. Electronically Signed   By: Franchot Gallo M.D.   On: 02/15/2018 14:00   Dg Swallowing Func-speech Pathology  Result Date: 02/19/2018 Objective Swallowing Evaluation: Type of Study: MBS-Modified Barium Swallow Study  Patient Details Name: John Sampson Date of Birth: 09/02/1936 Today's Date: 02/19/2018 Time: SLP Start Time (ACUTE ONLY): 1215 -SLP Stop Time (ACUTE ONLY): 1240 SLP Time Calculation (min) (ACUTE ONLY): 25 min Past Medical History: Past Medical History: Diagnosis Date . Atrial flutter ([place])   on Eliquis . AV malformation of GI tract  . COPD (chronic obstructive pulmonary disease) ([place]) 3.12.14  2D Echo, EF 50-55% . Coronary atherosclerosis of native coronary artery   Multivessel status post CABG . Gastric ulcer   Small - nonbleeding . GERD (gastroesophageal reflux disease)  . Headache(784.0)  . Hypothyroidism  . Iron deficiency anemia   Negative Givens capsule study  . Myocardial infarction ([place])  . PAD (peripheral artery disease) (H[facility])   Moderate bilateral SFA disease at angiography 01/2013 . Pituitary macroadenoma ([place])  . Thyroid cancer ([place])  . Vocal cord paralysis   left Past Surgical History: Past Surgical History: Procedure Laterality Date . ABDOMINAL AORTAGRAM N/A 02/19/2013  Procedure: ABDOMINAL Maxcine Ham;  Surgeon: [name], MD;  [facility] C[facility] LAB;  Service: Cardiovascular;  Laterality: N/A; . CARDIAC CATHETERIZATION   . CARDIOVERSION N/A 06/16/2015  Procedure: CARDIOVERSION;  Surgeon: Satira Sark, MD;  Location: [facility] ORS;  Service: Cardiovascular;  Laterality: N/A; . COLONOSCOPY  08/18/2012  Procedure: COLONOSCOPY;  Surgeon: [name], MD;  Location: [facility] ENDO SU[facility];  Service: Endoscopy;  Laterality: N/A;  1:25 . COLONOSCOPY N/A  09/12/2017  Procedure: COLONOSCOPY;  Surgeon: [name], MD;  Location: [facility];  Service: Endoscopy;  Laterality: N/A; . CORONARY ARTERY BYPASS GRAFT  2003  5 grafts - details not clear . CRANIOTOMY  12/31/2011  Procedure: CRANIOTOMY HYPOPHYSECTOMY TRANSNASAL APPROACH;  Surgeon: [name], MD;  Location: [place] NEURO ORS;  Service: Neurosurgery;  Laterality: N/A;  Transphenoidal Hypophysectomy With Fat Graft Harvest from right abdomen  . ESOPHAGOGASTRODUODENOSCOPY  08/18/2012  Procedure: ESOPHAGOGASTRODUODENOSCOPY (EGD);  Surgeon: [name], MD;  [facility];  Service: Endoscopy;  Laterality: N/A; . ESOPHAGOGASTRODUODENOSCOPY N/A 03/31/2017  Procedure: ESOPHAGOGASTRODUODENOSCOPY (EGD);  Surgeon: [name], MD;  [facility];  Service: Endoscopy;  Laterality: N/A; . ESOPHAGOGASTRODUODENOSCOPY N/A 09/12/2017  Procedure: ESOPHAGOGASTRODUODENOSCOPY (EGD);  Surgeon: [name], MD;  [facility];  Service: Endoscopy;  Laterality: N/A;  10:40 . EYE SURGERY  2012 . GIVENS CAPSULE STUDY N/A 01/25/2013  Procedure: GIVENS CAPSULE STUDY;  Surgeon: [name], MD;  [facility];  Service: Endoscopy;  Laterality: N/A;  730 . HOT HEMOSTASIS  03/31/2017  Procedure: HOT HEMOSTASIS (ARGON PLASMA COAGULATION/BICAP);  Surgeon: [name], MD;  [facility];  Service: Endoscopy;;  duodenum . LARYNGOPLASTY Left 09/14/2016  Procedure: LARYNGOPLASTY;  Surgeon: [name], MD;  Location: [place];  Service: ENT;  Laterality: Left; . LOWER EXTREMITY ANGIOGRAM N/A 02/19/2013  Procedure: LOWER EXTREMITY ANGIOGRAM;  Surgeon: [name], MD;  Location[facility] C[facility] LAB;  Service: Cardiovascular;  Laterality: N/A; . MICROLARYNGOSCOPY N/A 02/10/2018  Procedure: MICROLARYNGOSCOPY WITH VOCAL CORD INJECTION;  Surgeon: [name], MD;  Location: [place];  Service: ENT;  Laterality: N/A; . PERIPHERAL VASCULAR CATHETERIZATION N/A  01/19/2016  Procedure: Abdominal Aortogram;  Surgeon: Conrad Unicoi, MD;  Location: [place] CV LAB;  Service: Cardiovascular;  Laterality: N/A; . PERIPHERAL VASCULAR CATHETERIZATION Bilateral 01/19/2016  Procedure: Lower Extremity Angiography;  Surgeon: Conrad , MD;  Location: [place] CV LAB;  Service: Cardiovascular;  Laterality: Bilateral; . PV Angiogram  02/19/13  Indications: slow healing left calf ulcer . Stress Myocardial Perfusion  12/08/2011  Indications: Abnormal EKG, Eval of prior GABG . TEE WITHOUT CARDIOVERSION N/A 06/16/2015  Procedure: TRANSESOPHAGEAL ECHOCARDIOGRAM (TEE);  Surgeon: Satira Sark, MD;  Location[facility];  Service: Cardiovascular;  Laterality: N/A; . THYROIDECTOMY N/A 09/14/2016  Procedure: TOTAL THYROIDECTOMY;  Surgeon: [name], MD;  Location: [place];  Service: ENT;  Laterality: N/A; . TONSILLECTOMY  Age 36 HPI: [name] a 82 y.o.malewith medical history significant ofleft papillary thyroid carcinoma with paralysis of left vocal cord. Also with total thyroidectomy and laryngoplasty. He presented to [facility] increasing SOB and hoarseness. He was taken to the OR on 3/15 by Dr. Lucia Gaskins for injection of left vocal cord to strengthen his voice. He presented today to Dr. Pollie Friar office and there was concern for dehydration and dysphagia so he was sent to [facility] for direct admission (actually observation). Since the procedure, he has been able to have any solid or liquid food - it just won't go down or it goes down and comes right back up. Also unable to swallow any pills. He had the procedure Friday and did fine. He was told to go home with a soft diet and he was able to eat and drink that night. He ate breakfast ok Saturday but he wasn't able to eat after that. Dr. Shirlee More that on in-office exam the vocal cords are clear without swelling or infection but the patient was unable to swallow water in his office. He recommends IVF, labs, modified  barium swallow evaluationfor esophageal obstruction/aspiration.  Subjective: Alert, cooperative Assessment / Plan / Recommendation CHL IP CLINICAL IMPRESSIONS 02/19/2018 Clinical Impression Patient presents with improving, more moderate cervical esophageal dysphagia due to obstructing tissue at C5/6 and C7.  Pt is able to pass approximately ~50% bolus through the cervical esophagus in head neutral position, however there is penetration of backflow after the swallow (worse with liquids) with all consistencies, and there is sensed aspiration with thin liquids. Compensatory manuevers attempted, and most effective was head turn left with puree and honey-thick liquids and multiple dry swallows to decrease residue. Pt then able to hock to expectorate mild residue that remained in the vallecule. Head turn did not prevent penetration of backflow of thin and nectar thick liquids from the cervical esophagus. Chin tuck was not effective. Pt educated in real time using video feedback re: use of strategies and frequent oral care to reduce aspiration risk. Recommend inititating dys 1 (puree) and honey thick liquids with head turn left and multiple swallows per bite, cough or hock to expectorate periodically. As consuming POs will be a lengthy and laborious process due to number of swallows required, would consider continuation of cortrak for now as pt may have difficulty maintaining adequate nutrition/hydration with POs alone. Pt encouraged to complete thorough oral care before and after meals.  SLP Visit Diagnosis Dysphagia, pharyngoesophageal phase (R13.14) Attention and concentration deficit following -- Frontal lobe and executive function deficit following -- Impact on safety and function Moderate aspiration risk   CHL IP TREATMENT RECOMMENDATION 02/19/2018 Treatment Recommendations Therapy as outlined in treatment plan below   Prognosis 02/19/2018 Prognosis for Safe Diet Advancement Good Barriers to Reach Goals --  Barriers/Prognosis Comment -- CHL IP DIET RECOMMENDATION 02/19/2018 SLP Diet Recommendations Dysphagia 1 (Puree) solids;Honey thick liquids;Alternative means - temporary Liquid Administration via Cup;Spoon Medication Administration Via alternative means Compensations Multiple dry swallows after each bite/sip;Effortful swallow;Other (Comment) Postural Changes Seated upright at 90 degrees   CHL IP OTHER RECOMMENDATIONS 02/19/2018 Recommended Consults -- Oral Care Recommendations Oral care before and after PO Other Recommendations Order thickener from pharmacy;Prohibited food (jello, ice cream, thin soups);Remove water pitcher;Have oral suction available   CHL IP FOLLOW UP RECOMMENDATIONS 02/19/2018 Follow up Recommendations Other (comment)   CHL IP FREQUENCY AND DURATION 02/19/2018 Speech Therapy Frequency (ACUTE ONLY) min 2x/week Treatment Duration 2 weeks      CHL IP ORAL PHASE 02/19/2018 Oral Phase WFL Oral - Pudding Teaspoon -- Oral - Pudding Cup -- Oral - Honey Teaspoon -- Oral - Honey Cup -- Oral - Nectar Teaspoon -- Oral - Nectar Cup -- Oral - Nectar Straw -- Oral - Thin Teaspoon -- Oral - Thin Cup -- Oral - Thin Straw -- Oral - Puree -- Oral - Mech Soft -- Oral - Regular -- Oral - Multi-Consistency -- Oral - Pill -- Oral Phase - Comment --  CHL IP PHARYNGEAL PHASE 02/19/2018 Pharyngeal Phase WFL Pharyngeal- Pudding Teaspoon -- Pharyngeal -- Pharyngeal- Pudding Cup -- Pharyngeal -- Pharyngeal- Honey Teaspoon -- Pharyngeal -- Pharyngeal- Honey Cup -- Pharyngeal -- Pharyngeal- Nectar Teaspoon -- Pharyngeal -- Pharyngeal- Nectar Cup -- Pharyngeal -- Pharyngeal- Nectar Straw -- Pharyngeal -- Pharyngeal- Thin Teaspoon -- Pharyngeal -- Pharyngeal- Thin Cup -- Pharyngeal -- Pharyngeal- Thin Straw -- Pharyngeal -- Pharyngeal- Puree -- Pharyngeal -- Pharyngeal- Mechanical Soft -- Pharyngeal -- Pharyngeal- Regular -- Pharyngeal -- Pharyngeal- Multi-consistency -- Pharyngeal -- Pharyngeal- Pill -- Pharyngeal -- Pharyngeal  Comment --  CHL IP CERVICAL ESOPHAGEAL PHASE 02/19/2018 Cervical Esophageal Phase Impaired Pudding Teaspoon -- Pudding Cup -- Honey Teaspoon Reduced cricopharyngeal relaxation;Esophageal backflow into the pharynx;Esophageal backflow into the larynx Honey Cup Reduced cricopharyngeal relaxation;Esophageal backflow into the pharynx;Esophageal backflow into the larynx Nectar Teaspoon -- Nectar Cup Reduced cricopharyngeal  relaxation;Esophageal backflow into the pharynx;Esophageal backflow into the larynx Nectar Straw -- Thin Teaspoon Reduced cricopharyngeal relaxation;Esophageal backflow into the pharynx;Esophageal backflow into the larynx Thin Cup Reduced cricopharyngeal relaxation;Esophageal backflow into the pharynx;Esophageal backflow into the larynx Thin Straw NT Puree Reduced cricopharyngeal relaxation;Esophageal backflow into the pharynx;Esophageal backflow into the larynx Mechanical Soft -- Regular -- Multi-consistency -- Pill -- Cervical Esophageal Comment head turn left prevents penetration with honey thick liquids, puree only Deneise Lever, MS, [place] [phone] No flowsheet data found. Aliene Altes 02/19/2018, 1:58 PM              Dg Swallowing Func-speech Pathology  Result Date: 02/15/2018 Objective Swallowing Evaluation: Type of Study: MBS-Modified Barium Swallow Study  Patient Details Name: John [facility] MRN: [id number] Date of Birth: 03/23/1936 Today's Date: 02/15/2018 Time: SLP Start Time (ACUTE ONLY): 1000 -SLP Stop Time (ACUTE ONLY): 1027 SLP Time Calculation (min) (ACUTE ONLY): 27 min Past Medical History: Past Medical History: Diagnosis Date . Atrial flutter ([place])   on Eliquis . AV malformation of GI tract  . COPD (chronic obstructive pulmonary disease) ([place]) 3.12.14  2D Echo, EF 50-55% . Coronary atherosclerosis of native coronary artery   Multivessel status post CABG . Gastric ulcer   Small - nonbleeding . GERD (gastroesophageal reflux disease)  . Headache(784.0)  .  Hypothyroidism  . Iron deficiency anemia   Negative Givens capsule study  . Myocardial infarction ([place])  . PAD (peripheral artery disease) ([facility])   Moderate bilateral SFA disease at angiography 01/2013 . Pituitary macroadenoma ([place])  . Thyroid cancer ([place])  . Vocal cord paralysis   left Past Surgical History: Past Surgical History: Procedure Laterality Date . ABDOMINAL AORTAGRAM N/A 02/19/2013  Procedure: ABDOMINAL Maxcine Ham;  Surgeon: [name], MD;  Location: [facility] CATH LAB;  Service: Cardiovascular;  Laterality: N/A; . CARDIAC CATHETERIZATION   . CARDIOVERSION N/A 06/16/2015  Procedure: CARDIOVERSION;  Surgeon: Satira Sark, MD;  Location: [facility] ORS;  Service: Cardiovascular;  Laterality: N/A; . COLONOSCOPY  08/18/2012  Procedure: COLONOSCOPY;  Surgeon: [name], MD;  Location: [facility] ENDO SUITE;  Service: Endoscopy;  Laterality: N/A;  1:25 . COLONOSCOPY N/A 09/12/2017  Procedure: COLONOSCOPY;  Surgeon: [name], MD;  [facility];  Service: Endoscopy;  Laterality: N/A; . CORONARY ARTERY BYPASS GRAFT  2003  5 grafts - details not clear . CRANIOTOMY  12/31/2011  Procedure: CRANIOTOMY HYPOPHYSECTOMY TRANSNASAL APPROACH;  Surgeon: [name], MD;  Location: [place] NEURO ORS;  Service: Neurosurgery;  Laterality: N/A;  Transphenoidal Hypophysectomy With Fat Graft Harvest from right abdomen  . ESOPHAGOGASTRODUODENOSCOPY  08/18/2012  Procedure: ESOPHAGOGASTRODUODENOSCOPY (EGD);  Surgeon: [name], MD;  [facility];  Service: Endoscopy;  Laterality: N/A; . ESOPHAGOGASTRODUODENOSCOPY N/A 03/31/2017  Procedure: ESOPHAGOGASTRODUODENOSCOPY (EGD);  Surgeon: [name], MD;  [facility];  Service: Endoscopy;  Laterality: N/A; . ESOPHAGOGASTRODUODENOSCOPY N/A 09/12/2017  Procedure: ESOPHAGOGASTRODUODENOSCOPY (EGD);  Surgeon: [name], MD;  [facility];  Service: Endoscopy;  Laterality: N/A;  10:40 . EYE SURGERY  2012 . GIVENS CAPSULE STUDY N/A 01/25/2013   Procedure: GIVENS CAPSULE STUDY;  Surgeon: [name], MD;  [facility];  Service: Endoscopy;  Laterality: N/A;  730 . HOT HEMOSTASIS  03/31/2017  Procedure: HOT HEMOSTASIS (ARGON PLASMA COAGULATION/BICAP);  Surgeon: [name], MD;  [facility];  Service: Endoscopy;;  duodenum . LARYNGOPLASTY Left 09/14/2016  Procedure: LARYNGOPLASTY;  Surgeon: [name], MD;  Location: [facility] OR;  Service: ENT;  Laterality: Left; . LOWER EXTREMITY ANGIOGRAM N/A 02/19/2013  Procedure: LOWER EXTREMITY ANGIOGRAM;  Surgeon: [name], MD;  Location: [facility] CATH LAB;  Service: Cardiovascular;  Laterality: N/A; . MICROLARYNGOSCOPY N/A 02/10/2018  Procedure: MICROLARYNGOSCOPY WITH VOCAL CORD INJECTION;  Surgeon: [name], MD;  Location: [place];  Service: ENT;  Laterality: N/A; . PERIPHERAL VASCULAR CATHETERIZATION N/A 01/19/2016  Procedure: Abdominal Aortogram;  Surgeon: Conrad Magee, MD;  Location: [place] CV LAB;  Service: Cardiovascular;  Laterality: N/A; . PERIPHERAL VASCULAR CATHETERIZATION Bilateral 01/19/2016  Procedure: Lower Extremity Angiography;  Surgeon: Conrad [place], MD;  Location: [facility] CV LAB;  Service: Cardiovascular;  Laterality: Bilateral; . PV Angiogram  02/19/13  Indications: slow healing left calf ulcer . Stress Myocardial Perfusion  12/08/2011  Indications: Abnormal EKG, Eval of prior GABG . TEE WITHOUT CARDIOVERSION N/A 06/16/2015  Procedure: TRANSESOPHAGEAL ECHOCARDIOGRAM (TEE);  Surgeon: Satira Sark, MD;  Location: [facility];  Service: Cardiovascular;  Laterality: N/A; . THYROIDECTOMY N/A 09/14/2016  Procedure: TOTAL THYROIDECTOMY;  Surgeon: [name], MD;  Location: [place];  Service: ENT;  Laterality: N/A; . 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 Also with total thyroidectomy and laryngoplasty. He  presented to [facility] increasing SOB and hoarseness. He was taken to the OR on 3/15 by Dr. Lucia Gaskins for injection of left vocal cord to strengthen his voice. He presented today to Dr. Pollie Friar office and there was concern for dehydration and dysphagia so he was sent to [facility] for direct admission (actually observation). Since the procedure, he has been able to have any solid or liquid food - it just won't go down or it goes down and comes right back up. Also unable to swallow any pills. He had the procedure Friday and did fine. He was told to go home with a soft diet and he was able to eat and drink that night. He ate breakfast ok Saturday but he wasn't able to eat after that. Dr. Shirlee More that on in-office exam the vocal cords are clear without swelling or infection but the patient was unable to swallow water in his office. He recommends IVF, labs, modified barium swallow evaluationfor esophageal obstruction/aspiration.  No Data Recorded Assessment / Plan / Recommendation CHL IP CLINICAL IMPRESSIONS 02/15/2018 Clinical Impression Pt demosntrates a severe cervical esophageal dysphagia with obstructing tissue at C5/6 and C7. Pt is unable to pass 1/4 teaspoon of thin liquids despite maximal effortful swallow or a head turn R or L. Chin tuck also ineffective. Attempted large bolus to adress if heavier propulsion could force more opening without success. 90% or more of the bolus returned to the pharynx and was aspriated with sensation. Pt does have a hard cough ability and can expectorate residue. Recommend pt remain fully NPO. Discussed result with MD, will need ENT to review study images and address needs. Pt may need a temporary means of nutrition, though passage of any NG would likely be difficult even under fluoroscopy and could be contraindicated. Esophagram discontinued due to pt inability to consume any quantity of barium for further esophageal assessment.  SLP Visit Diagnosis Dysphagia,  pharyngoesophageal phase (R13.14) Attention and concentration deficit following -- Frontal lobe and executive function deficit following -- Impact on safety and function Severe aspiration risk;Risk for inadequate nutrition/hydration   CHL IP TREATMENT RECOMMENDATION 02/15/2018 Treatment Recommendations Therapy as outlined in  treatment plan below   Prognosis 02/15/2018 Prognosis for Safe Diet Advancement Good Barriers to Reach Goals -- Barriers/Prognosis Comment -- CHL IP DIET RECOMMENDATION 02/15/2018 SLP Diet Recommendations NPO;Alternative means - temporary Liquid Administration via -- Medication Administration Via alternative means Compensations -- Postural Changes --   CHL IP OTHER RECOMMENDATIONS 02/15/2018 Recommended Consults Consider ENT evaluation Oral Care Recommendations -- Other Recommendations --   CHL IP FOLLOW UP RECOMMENDATIONS 02/15/2018 Follow up Recommendations 24 hour supervision/assistance   CHL IP FREQUENCY AND DURATION 02/15/2018 Speech Therapy Frequency (ACUTE ONLY) min 2x/week Treatment Duration 2 weeks      CHL IP ORAL PHASE 02/15/2018 Oral Phase WFL Oral - Pudding Teaspoon -- Oral - Pudding Cup -- Oral - Honey Teaspoon -- Oral - Honey Cup -- Oral - Nectar Teaspoon -- Oral - Nectar Cup -- Oral - Nectar Straw -- Oral - Thin Teaspoon -- Oral - Thin Cup -- Oral - Thin Straw -- Oral - Puree -- Oral - Mech Soft -- Oral - Regular -- Oral - Multi-Consistency -- Oral - Pill -- Oral Phase - Comment --  CHL IP PHARYNGEAL PHASE 02/15/2018 Pharyngeal Phase WFL Pharyngeal- Pudding Teaspoon -- Pharyngeal -- Pharyngeal- Pudding Cup -- Pharyngeal -- Pharyngeal- Honey Teaspoon -- Pharyngeal -- Pharyngeal- Honey Cup -- Pharyngeal -- Pharyngeal- Nectar Teaspoon -- Pharyngeal -- Pharyngeal- Nectar Cup -- Pharyngeal -- Pharyngeal- Nectar Straw -- Pharyngeal -- Pharyngeal- Thin Teaspoon -- Pharyngeal -- Pharyngeal- Thin Cup -- Pharyngeal -- Pharyngeal- Thin Straw -- Pharyngeal -- Pharyngeal- Puree -- Pharyngeal --  Pharyngeal- Mechanical Soft -- Pharyngeal -- Pharyngeal- Regular -- Pharyngeal -- Pharyngeal- Multi-consistency -- Pharyngeal -- Pharyngeal- Pill -- Pharyngeal -- Pharyngeal Comment --  CHL IP CERVICAL ESOPHAGEAL PHASE 02/15/2018 Cervical Esophageal Phase Impaired Pudding Teaspoon -- Pudding Cup -- Honey Teaspoon -- Honey Cup -- Nectar Teaspoon -- Nectar Cup -- Nectar Straw -- Thin Teaspoon Reduced cricopharyngeal relaxation;Esophageal backflow into the pharynx;Esophageal backflow into the larynx Thin Cup -- Thin Straw Reduced cricopharyngeal relaxation;Esophageal backflow into the pharynx;Esophageal backflow into the larynx Puree -- Mechanical Soft -- Regular -- Multi-consistency -- Pill -- Cervical Esophageal Comment -- No flowsheet data found. DeBlo[place], Katherene Ponto 02/15/2018, 10:41 AM                Discharge Exam: BP (!) 123/49   Pulse (!) 49   Temp 98.5 F (36.9 C)   Resp 16   Ht 5\' 9"  (1.753 m)   Wt 85 kg (187 lb 6.3 oz)   SpO2 99%   BMI 27.67 kg/m   General: sitting in bedside chair, no apparent distress Eyes: EOMI, anicteric ENT: Oral Mucosa clear and moist, hoarse voice Neck: increased swelling on right side Cardiovascular: regular rate and rhythm, no murmurs, rubs or gallops, no edema Respiratory: Normal respiratory effort on 2 L [place], lungs clear to auscultation bilaterally Abdomen: soft, non-distended, non-tender,PEG tube in place draining, dressing surrounding clean, dry intact with clean binder over the top Skin: No Rash Neurologic: Grossly no focal neuro deficit.Mental status AAOx3, speech normal, Psychiatric:flat affect   Discharge Instructions You were cared for by a hospitalist during your hospital stay. If you have any questions about your discharge medications or the care you received while you were in the hospital after you are discharged, you can call the unit and asked to speak with the hospitalist on call if the hospitalist that took care of you is not available.  Once you are discharged, your primary care physician will handle any further medical issues. Please  note that NO REFILLS for any discharge medications will be authorized once you are discharged, as it is imperative that you return to your primary care physician (or establish a relationship with a primary care physician if you do not have one) for your aftercare needs so that they can reassess your need for medications and monitor your lab values.  Discharge Instructions    Diet - low sodium heart healthy   Complete by:  As directed    Dysphagia ( pureed)   Increase activity slowly   Complete by:  As directed      Allergies as of 03/02/2018      Reactions   Morphine And Related Nausea And Vomiting      Medication List    STOP taking these medications   potassium chloride SA 20 MEQ tablet Commonly known as:  K-DUR,KLOR-CON     TAKE these medications   albuterol 108 (90 Base) MCG/ACT inhaler Commonly known as:  PROVENTIL HFA Inhale 2 puffs into the lungs every 6 (six) hours as needed for wheezing or shortness of breath.   apixaban 5 MG Tabs tablet Commonly known as:  ELIQUIS Take 1 tablet (5 mg total) by mouth 2 (two) times daily. Ok to crush with oral food ( NOT PEG) What changed:  additional instructions   diltiazem 240 MG 24 hr capsule Commonly known as:  CARDIZEM CD HOLD until seen by PCP What changed:    how much to take  how to take this  when to take this  additional instructions   feeding supplement (JEVITY 1.2 CAL) Liqd Place 1,000 mLs into feeding tube continuous.   feeding supplement (PRO-STAT SUGAR FREE 64) Liqd Place 30 mLs into feeding tube 2 (two) times daily.   folic acid 1 MG tablet Commonly known as:  FOLVITE Take 1 tablet (1 mg total) by mouth daily.   free water Soln Place 200 mLs into feeding tube 4 (four) times daily.   furosemide 20 MG tablet Commonly known as:  LASIX HOLD until seen by PCP What changed:    how much to take  how to  take this  when to take this  additional instructions   levothyroxine 75 MCG tablet Commonly known as:  SYNTHROID, LEVOTHROID Take 1 tablet (75 mcg total) by mouth daily before breakfast. Start taking on:  03/03/2018 What changed:    medication strength  how much to take   lisinopril 40 MG tablet Commonly known as:  PRINIVIL,ZESTRIL HOLD until seen by PCP What changed:    how much to take  how to take this  when to take this  additional instructions   metoprolol succinate 50 MG 24 hr tablet Commonly known as:  TOPROL-XL HOLD until seen by PCP What changed:    how much to take  how to take this  when to take this  additional instructions   metoprolol tartrate 50 MG tablet Commonly known as:  LOPRESSOR Take 1 tablet (50 mg total) by mouth 2 (two) times daily.   mirtazapine 15 MG tablet Commonly known as:  REMERON Take 1 tablet (15 mg total) by mouth at bedtime.   nitroGLYCERIN 0.4 MG SL tablet Commonly known as:  NITROSTAT Place 1 tablet (0.4 mg total) under the tongue every 5 (five) minutes as needed for chest pain.   omeprazole 20 MG capsule Commonly known as:  PRILOSEC Take 1 capsule (20 mg total) by mouth 2 (two) times daily before a meal.   [place]  Take 240 g by mouth as needed.   simvastatin 40 MG tablet Commonly known as:  ZOCOR Take 0.5 tablets (20 mg total) by mouth at bedtime.   white petrolatum Gel Commonly known as:  VASELINE Apply 1 application topically as needed for lip care.      Allergies  Allergen Reactions  . Morphine And Related Nausea And Vomiting    Contact information for follow-up providers    Sinda Du, MD. Call.   Specialty:  Pulmonary Disease Contact information: [place] [address] [phone]            Contact information for after-discharge care    Destination    [facility] .   Service:  Skilled Nursing Contact  information: [address] [place] [phone]                   The results of significant diagnostics from this hospitalization (including imaging, microbiology, ancillary and laboratory) are listed below for reference.    Significant Diagnostic Studies: Ct Abdomen Wo Contrast  Result Date: 02/23/2018 CLINICAL DATA:  Dysphagia, aspiration risk, assess anatomy for gastrostomy insertion EXAM: CT ABDOMEN WITHOUT CONTRAST TECHNIQUE: Multidetector CT imaging of the abdomen was performed following the standard protocol without IV contrast. COMPARISON:  02/22/2018 modified swallow FINDINGS: Lower chest: Several bilateral lower chest pulmonary nodules all measuring 5 mm or less in size within the lower lobes, inferior right middle lobe, and lingula Right basilar atelectasis versus scarring also noted. Normal heart size. Native coronary atherosclerosis. Previous median sternotomy evident. Feeding tube extends into the stomach distally. Hepatobiliary: Cholelithiasis evident. No focal hepatic abnormality. No biliary dilatation or obstruction. Pancreas: No focal abnormality or ductal dilatation. No surrounding inflammatory process. Spleen: Normal in size. Adrenals/Urinary Tract: Normal adrenal glands. Kidneys demonstrate no acute obstructive uropathy or hydronephrosis. Ureters are symmetric and decompressed. Mild chronic perinephric strandy edema. Indeterminate posterior left renal hyperdense cortical lesion measures 10 mm this may represent a proteinaceous or hemorrhagic cyst. Stomach/Bowel: Feeding tube enters the stomach. No significant hiatal hernia. Stomach anatomy appears favorable for fluoroscopic gastrostomy technique. Stomach is inferior to the left hepatic lobe and the midportion of the stomach is superior to the transverse colon. Feeding tube extends into the duodenal bulb. Stomach is currently decompressed. Vascular/Lymphatic: Aortic atherosclerosis noted. Negative  for aneurysm. No retroperitoneal hemorrhage or hematoma. No abdominal adenopathy. Other: No abdominal wall hernia. No free fluid or ascites. Negative for abscess. Musculoskeletal: No acute osseous finding. Anterior L2 vertebral body lytic lesion without compression pathologic fracture. Appearance is suspicious for metastatic disease or myeloma. IMPRESSION: Stomach anatomy appears favorable for attempt at fluoroscopic gastrostomy insertion. Numerous subcentimeter bilateral pulmonary nodules suspicious for pulmonary metastases. Lytic L2 vertebral body suspicious for osseous metastasis. Atherosclerosis without aneurysm Cholelithiasis Electronically Signed   By: Jerilynn Mages.  Shick M.D.   On: 02/23/2018 16:06   Dg Chest 2 View  Result Date: 02/18/2018 CLINICAL DATA:  Leukocytosis. EXAM: CHEST - 2 VIEW COMPARISON:  Chest x-ray dated December 06, 2017. FINDINGS: Feeding tube noted with the tip likely in the distal stomach. Stable cardiomegaly status post CABG. Normal pulmonary vascularity. Emphysema. No focal consolidation, pleural effusion, or pneumothorax. Unchanged elevation of the right hemidiaphragm. No acute osseous abnormality. IMPRESSION: 1. COPD.  No active cardiopulmonary disease. Electronically Signed   By: Titus Dubin M.D.   On: 02/18/2018 11:37   Ct Soft Tissue Neck W Contrast  Result Date: 02/22/2018 CLINICAL DATA:  Improving dysphagia.  Status post laryngoscopy. History of thyroid cancer, thyroidectomy, laryngoplasty, pituitary macroadenoma. EXAM: CT NECK WITH CONTRAST TECHNIQUE: Multidetector CT imaging of the neck was performed using the standard protocol following the bolus administration of intravenous contrast. CONTRAST:  65mL ISOVUE-300 IOPAMIDOL (ISOVUE-300) INJECTION 61% COMPARISON:  CT neck August 25, 2016 FINDINGS: PHARYNX AND LARYNX: Focally sclerotic LEFT thyroid and cricoid cartilages consistent with tumoral involvement. Mild medial deviation RIGHT true vocal cord could reflect paralysis.  Normal pharynx. SALIVARY GLANDS: Normal. THYROID: Status post thyroidectomy. 21 x 14 mm abnormal soft tissue LEFT posterior thyroid bed and appears to invade the anterior status soft [place]. Additional subcentimeter nodules inferior LEFT thyroid bed. LYMPH NODES: 10 mm round LEFT level 4 lymph node. 9 mm LEFT level 7 lymph node with additional smaller bilateral 7 lymph nodes. VASCULAR: Moderate calcific atherosclerosis aortic arch. Moderate calcific atherosclerosis carotid bifurcations. LIMITED INTRACRANIAL: Normal. VISUALIZED ORBITS: Status post bilateral ocular lens implants. MASTOIDS AND VISUALIZED PARANASAL SINUSES: LEFT nasogastric tube. SKELETON: Nonacute.  Patient is edentulous. UPPER CHEST: Centrilobular emphysema. Multiple LEFT pulmonary nodules measuring to at least 11 mm, some of which are new from prior CT. Moderate centrilobular emphysema. Status post median sternotomy. OTHER: None. IMPRESSION: 1. Status post thyroidectomy with 21 x 14 mm mass LEFT thyroid bed consistent with invasive residual versus recurrent tumor (including suspected esophageal invasion). 2. New pathologic level for lymphadenopathy with probable additional smaller alignment lymph nodes LEFT level 7. 3. Multiple pulmonary nodules, some which are new from prior CT most compatible with progressive metastasis. Aortic Atherosclerosis (ICD10-I70.0) and Emphysema (ICD10-J43.9). Electronically Signed   By: Elon Alas M.D.   On: 02/22/2018 21:14   Ir Gastrostomy Tube Mod Sed  Result Date: 02/24/2018 INDICATION: Thyroid cancer, dysphagia, malnutrition EXAM: FLUOROSCOPIC 20 FRENCH PULL-THROUGH GASTROSTOMY Date:  02/24/2018 02/24/2018 1:43 pm Radiologist:  Jerilynn Mages. Daryll Brod, MD Guidance:  Fluoroscopic MEDICATIONS: 3.375 g Zosyn; Antibiotics were administered within 1 hour of the procedure. Glucagon 0.5 mg IV ANESTHESIA/SEDATION: Versed 1.5 mg IV; Fentanyl 75 mcg IV Moderate Sedation Time:  11 minutes The patient was continuously  monitored during the procedure by the interventional radiology nurse under my direct supervision. CONTRAST:  10 cc Isovue-administered into the gastric lumen. FLUOROSCOPY TIME:  Fluoroscopy Time: 1 minutes 18 seconds (12 mGy). COMPLICATIONS: None immediate. PROCEDURE: Informed consent was obtained from the patient following explanation of the procedure, risks, benefits and alternatives. The patient understands, agrees and consents for the procedure. All questions were addressed. A time out was performed. Maximal barrier sterile technique utilized including caps, mask, sterile gowns, sterile gloves, large sterile drape, hand hygiene, and betadine prep. The left upper quadrant was sterilely prepped and draped. An oral gastric catheter was inserted into the stomach under fluoroscopy. The existing nasogastric feeding tube was removed. Air was injected into the stomach for insufflation and visualization under fluoroscopy. The air distended stomach was confirmed beneath the anterior abdominal wall in the frontal and lateral projections. Under sterile conditions and local anesthesia, a 28 gauge trocar needle was utilized to access the stomach percutaneously beneath the left subcostal margin. Needle position was confirmed within the stomach under biplane fluoroscopy. Contrast injection confirmed position also. A single T tack was deployed for gastropexy. Over an Amplatz guide wire, a 9-French sheath was inserted into the stomach. A snare device was utilized to capture the oral gastric catheter. The snare device was pulled retrograde from the stomach up the esophagus and out the oropharynx. The 20-French pull-through gastrostomy was connected to the snare device and pulled  antegrade through the oropharynx down the esophagus into the stomach and then through the percutaneous tract external to the patient. The gastrostomy was assembled externally. Contrast injection confirms position in the stomach. Images were obtained for  documentation. The patient tolerated procedure well. No immediate complication. IMPRESSION: Fluoroscopic insertion of a 20-French "pull-through" gastrostomy. Electronically Signed   By: Jerilynn Mages.  Shick M.D.   On: 02/24/2018 13:45   Dg Chest Port 1 View  Result Date: 02/22/2018 CLINICAL DATA:  Dyspnea. History of COPD, previous MI, atrial flutter, chronic CHF. Former smoker. EXAM: PORTABLE CHEST 1 VIEW COMPARISON:  PA and lateral chest x-ray of February 18, 2018. FINDINGS: The lungs are adequately inflated and clear. The heart and pulmonary vascularity are normal. The mediastinum is normal in width. There are post CABG changes. There is calcification in the wall of the aortic arch. IMPRESSION: Previous CABG. No pneumonia, CHF, nor other acute cardiopulmonary abnormality. Electronically Signed   By: David  Martinique M.D.   On: 02/22/2018 14:22   Dg Addison Bailey G Tube Plc W/fl W/rad  Result Date: 02/15/2018 CLINICAL DATA:  Unable to swallow.  Need for feeding tube EXAM: NASO G TUBE PLACEMENT WITH FL AND WITH RAD CONTRAST:  10 mL Isovue contrast injected to the feeding tube. FLUOROSCOPY TIME:  Fluoroscopy Time:  8 minutes 4 seconds Radiation Exposure Index (if provided by the fluoroscopic device): Number of Acquired Spot Images: 0 COMPARISON:  Speech pathology study 02/15/2018 FINDINGS: The technologist initially attempted to place the tube, and had difficulty passing the tube through the pharynx into the esophagus. My presence was requested. After some effort, the tube was advanced into the stomach using a Glidewire. The tube was placed in the antrum however I was not able to get the tube into the duodenum despite multiple attempts with the tube and wire. Tube was left in the gastric antrum IMPRESSION: Feeding tube placed with the tip in the gastric antrum. I was not able to get the tube into the duodenum. Electronically Signed   By: Franchot Gallo M.D.   On: 02/15/2018 14:00   Dg Swallowing Func-speech Pathology  Result  Date: 02/19/2018 Objective Swallowing Evaluation: Type of Study: MBS-Modified Barium Swallow Study  Patient Details Name: John Sampson MRN: [id number] Date of Birth: 1936-10-08 Today's Date: 02/19/2018 Time: SLP Start Time (ACUTE ONLY): 1215 -SLP Stop Time (ACUTE ONLY): 1240 SLP Time Calculation (min) (ACUTE ONLY): 25 min Past Medical History: Past Medical History: Diagnosis Date . Atrial flutter ([place])   on Eliquis . AV malformation of GI tract  . COPD (chronic obstructive pulmonary disease) ([place]) 3.12.14  2D Echo, EF 50-55% . Coronary atherosclerosis of native coronary artery   Multivessel status post CABG . Gastric ulcer   Small - nonbleeding . GERD (gastroesophageal reflux disease)  . Headache(784.0)  . Hypothyroidism  . Iron deficiency anemia   Negative Givens capsule study  . Myocardial infarction ([place])  . PAD (peripheral artery disease) (HCC)   Moderate bilateral SFA disease at angiography 01/2013 . Pituitary macroadenoma ([place])  . Thyroid cancer ([place])  . Vocal cord paralysis   left Past Surgical History: Past Surgical History: Procedure Laterality Date . ABDOMINAL AORTAGRAM N/A 02/19/2013  Procedure: ABDOMINAL Maxcine Ham;  Surgeon: [name], MD;  Location: [facility] CATH LAB;  Service: Cardiovascular;  Laterality: N/A; . CARDIAC CATHETERIZATION   . CARDIOVERSION N/A 06/16/2015  Procedure: CARDIOVERSION;  Surgeon: Satira Sark, MD;  Location: [facility] ORS;  Service: Cardiovascular;  Laterality: N/A; . COLONOSCOPY  08/18/2012  Procedure: COLONOSCOPY;  Surgeon: [name], MD;  [facility] END[facility];  Service: Endoscopy;  Laterality: N/A;  1:25 . COLONOSCOPY N/A 09/12/2017  Procedure: COLONOSCOPY;  Surgeon: [name], MD;  [facility];  Service: Endoscopy;  Laterality: N/A; . CORONARY ARTERY BYPASS GRAFT  2003  5 grafts - details not clear . CRANIOTOMY  12/31/2011  Procedure: CRANIOTOMY HYPOPHYSECTOMY TRANSNASAL APPROACH;  Surgeon: [name], MD;  Location: [place] NEURO ORS;  Service: Neurosurgery;   Laterality: N/A;  Transphenoidal Hypophysectomy With Fat Graft Harvest from right abdomen  . ESOPHAGOGASTRODUODENOSCOPY  08/18/2012  Procedure: ESOPHAGOGASTRODUODENOSCOPY (EGD);  Surgeon: [name], MD;  [facility];  Service: Endoscopy;  Laterality: N/A; . ESOPHAGOGASTRODUODENOSCOPY N/A 03/31/2017  Procedure: ESOPHAGOGASTRODUODENOSCOPY (EGD);  Surgeon: [name], MD;  [facility];  Service: Endoscopy;  Laterality: N/A; . ESOPHAGOGASTRODUODENOSCOPY N/A 09/12/2017  Procedure: ESOPHAGOGASTRODUODENOSCOPY (EGD);  Surgeon: [name], MD;  [facility];  Service: Endoscopy;  Laterality: N/A;  10:40 . EYE SURGERY  2012 . GIVENS CAPSULE STUDY N/A 01/25/2013  Procedure: GIVENS CAPSULE STUDY;  Surgeon: [name], MD;  [facility];  Service: Endoscopy;  Laterality: N/A;  730 . HOT HEMOSTASIS  03/31/2017  Procedure: HOT HEMOSTASIS (ARGON PLASMA COAGULATION/BICAP);  Surgeon: [name], MD;  [facility];  Service: Endoscopy;;  duodenum . LARYNGOPLASTY Left 09/14/2016  Procedure: LARYNGOPLASTY;  Surgeon: [name], MD;  Location: [place];  Service: ENT;  Laterality: Left; . LOWER EXTREMITY ANGIOGRAM N/A 02/19/2013  Procedure: LOWER EXTREMITY ANGIOGRAM;  Surgeon: [name], MD;  Location: [facility] CATH LAB;  Service: Cardiovascular;  Laterality: N/A; . MICROLARYNGOSCOPY N/A 02/10/2018  Procedure: MICROLARYNGOSCOPY WITH VOCAL CORD INJECTION;  Surgeon: [name], MD;  Location: [place];  Service: ENT;  Laterality: N/A; . PERIPHERAL VASCULAR CATHETERIZATION N/A 01/19/2016  Procedure: Abdominal Aortogram;  Surgeon: Conrad Weinert, MD;  Location: [place] CV LAB;  Service: Cardiovascular;  Laterality: N/A; . PERIPHERAL VASCULAR CATHETERIZATION Bilateral 01/19/2016  Procedure: Lower Extremity Angiography;  Surgeon: Conrad Rossford, MD;  Location: [place] CV LAB;  Service: Cardiovascular;  Laterality: Bilateral; .  PV Angiogram  02/19/13  Indications: slow healing left calf ulcer . Stress Myocardial Perfusion  12/08/2011  Indications: Abnormal EKG, Eval of prior GABG . TEE WITHOUT CARDIOVERSION N/A 06/16/2015  Procedure: TRANSESOPHAGEAL ECHOCARDIOGRAM (TEE);  Surgeon: Satira Sark, MD;  [facility];  Service: Cardiovascular;  Laterality: N/A; . THYROIDECTOMY N/A 09/14/2016  Procedure: TOTAL THYROIDECTOMY;  Surgeon: [name], MD;  Location: [place];  Service: ENT;  Laterality: N/A; . 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 Also with total thyroidectomy and laryngoplasty. He presented to [facility] increasing SOB and hoarseness. He was taken to the OR on 3/15 by Dr. Lucia Gaskins for injection of left vocal cord to strengthen his voice. He presented today to Dr. Pollie Friar office and there was concern for dehydration and dysphagia so he was sent to [facility] for direct admission (actually observation). Since the procedure, he has been able to have any solid or liquid food - it just won't go down or it goes down and comes right back up. Also unable to swallow any pills. He had the procedure Friday and did fine. He was told to go home with a soft diet and he was able to eat and drink that night. He  ate breakfast ok Saturday but he wasn't able to eat after that. Dr. Shirlee More that on in-office exam the vocal cords are clear without swelling or infection but the patient was unable to swallow water in his office. He recommends IVF, labs, modified barium swallow evaluationfor esophageal obstruction/aspiration.  Subjective: Alert, cooperative Assessment / Plan / Recommendation CHL IP CLINICAL IMPRESSIONS 02/19/2018 Clinical Impression Patient presents with improving, more moderate cervical esophageal dysphagia due to obstructing tissue at C5/6 and C7. Pt is able to pass approximately ~50% bolus through  the cervical esophagus in head neutral position, however there is penetration of backflow after the swallow (worse with liquids) with all consistencies, and there is sensed aspiration with thin liquids. Compensatory manuevers attempted, and most effective was head turn left with puree and honey-thick liquids and multiple dry swallows to decrease residue. Pt then able to hock to expectorate mild residue that remained in the vallecule. Head turn did not prevent penetration of backflow of thin and nectar thick liquids from the cervical esophagus. Chin tuck was not effective. Pt educated in real time using video feedback re: use of strategies and frequent oral care to reduce aspiration risk. Recommend inititating dys 1 (puree) and honey thick liquids with head turn left and multiple swallows per bite, cough or hock to expectorate periodically. As consuming POs will be a lengthy and laborious process due to number of swallows required, would consider continuation of cortrak for now as pt may have difficulty maintaining adequate nutrition/hydration with POs alone. Pt encouraged to complete thorough oral care before and after meals.  SLP Visit Diagnosis Dysphagia, pharyngoesophageal phase (R13.14) Attention and concentration deficit following -- Frontal lobe and executive function deficit following -- Impact on safety and function Moderate aspiration risk   CHL IP TREATMENT RECOMMENDATION 02/19/2018 Treatment Recommendations Therapy as outlined in treatment plan below   Prognosis 02/19/2018 Prognosis for Safe Diet Advancement Good Barriers to Reach Goals -- Barriers/Prognosis Comment -- CHL IP DIET RECOMMENDATION 02/19/2018 SLP Diet Recommendations Dysphagia 1 (Puree) solids;Honey thick liquids;Alternative means - temporary Liquid Administration via Cup;Spoon Medication Administration Via alternative means Compensations Multiple dry swallows after each bite/sip;Effortful swallow;Other (Comment) Postural Changes Seated upright  at 90 degrees   CHL IP OTHER RECOMMENDATIONS 02/19/2018 Recommended Consults -- Oral Care Recommendations Oral care before and after PO Other Recommendations Order thickener from pharmacy;Prohibited food (jello, ice cream, thin soups);Remove water pitcher;Have oral suction available   CHL IP FOLLOW UP RECOMMENDATIONS 02/19/2018 Follow up Recommendations Other (comment)   CHL IP FREQUENCY AND DURATION 02/19/2018 Speech Therapy Frequency (ACUTE ONLY) min 2x/week Treatment Duration 2 weeks      CHL IP ORAL PHASE 02/19/2018 Oral Phase WFL Oral - Pudding Teaspoon -- Oral - Pudding Cup -- Oral - Honey Teaspoon -- Oral - Honey Cup -- Oral - Nectar Teaspoon -- Oral - Nectar Cup -- Oral - Nectar Straw -- Oral - Thin Teaspoon -- Oral - Thin Cup -- Oral - Thin Straw -- Oral - Puree -- Oral - Mech Soft -- Oral - Regular -- Oral - Multi-Consistency -- Oral - Pill -- Oral Phase - Comment --  CHL IP PHARYNGEAL PHASE 02/19/2018 Pharyngeal Phase WFL Pharyngeal- Pudding Teaspoon -- Pharyngeal -- Pharyngeal- Pudding Cup -- Pharyngeal -- Pharyngeal- Honey Teaspoon -- Pharyngeal -- Pharyngeal- Honey Cup -- Pharyngeal -- Pharyngeal- Nectar Teaspoon -- Pharyngeal -- Pharyngeal- Nectar Cup -- Pharyngeal -- Pharyngeal- Nectar Straw -- Pharyngeal -- Pharyngeal- Thin Teaspoon -- Pharyngeal -- Pharyngeal- Thin Cup -- Pharyngeal -- Pharyngeal- Thin Straw --  Pharyngeal -- Pharyngeal- Puree -- Pharyngeal -- Pharyngeal- Mechanical Soft -- Pharyngeal -- Pharyngeal- Regular -- Pharyngeal -- Pharyngeal- Multi-consistency -- Pharyngeal -- Pharyngeal- Pill -- Pharyngeal -- Pharyngeal Comment --  CHL IP CERVICAL ESOPHAGEAL PHASE 02/19/2018 Cervical Esophageal Phase Impaired Pudding Teaspoon -- Pudding Cup -- Honey Teaspoon Reduced cricopharyngeal relaxation;Esophageal backflow into the pharynx;Esophageal backflow into the larynx Honey Cup Reduced cricopharyngeal relaxation;Esophageal backflow into the pharynx;Esophageal backflow into the larynx Nectar  Teaspoon -- Nectar Cup Reduced cricopharyngeal relaxation;Esophageal backflow into the pharynx;Esophageal backflow into the larynx Nectar Straw -- Thin Teaspoon Reduced cricopharyngeal relaxation;Esophageal backflow into the pharynx;Esophageal backflow into the larynx Thin Cup Reduced cricopharyngeal relaxation;Esophageal backflow into the pharynx;Esophageal backflow into the larynx Thin Straw NT Puree Reduced cricopharyngeal relaxation;Esophageal backflow into the pharynx;Esophageal backflow into the larynx Mechanical Soft -- Regular -- Multi-consistency -- Pill -- Cervical Esophageal Comment head turn left prevents penetration with honey thick liquids, puree only Deneise Lever, MS, [place] [phone] No flowsheet data found. Aliene Altes 02/19/2018, 1:58 PM              Dg Swallowing Func-speech Pathology  Result Date: 02/15/2018 Objective Swallowing Evaluation: Type of Study: MBS-Modified Barium Swallow Study  Patient Details Name: John Sampson MRN: [id number] Date of Birth: Aug 21, 1936 Today's Date: 02/15/2018 Time: SLP Start Time (ACUTE ONLY): 1000 -SLP Stop Time (ACUTE ONLY): 1027 SLP Time Calculation (min) (ACUTE ONLY): 27 min Past Medical History: Past Medical History: Diagnosis Date . Atrial flutter ([place])   on Eliquis . AV malformation of GI tract  . COPD (chronic obstructive pulmonary disease) ([place]) 3.12.14  2D Echo, EF 50-55% . Coronary atherosclerosis of native coronary artery   Multivessel status post CABG . Gastric ulcer   Small - nonbleeding . GERD (gastroesophageal reflux disease)  . Headache(784.0)  . Hypothyroidism  . Iron deficiency anemia   Negative Givens capsule study  . Myocardial infarction ([place])  . PAD (peripheral artery disease) (HCC)   Moderate bilateral SFA disease at angiography 01/2013 . Pituitary macroadenoma ([place])  . Thyroid cancer ([place])  . Vocal cord paralysis   left Past Surgical History: Past Surgical History: Procedure Laterality Date . ABDOMINAL  AORTAGRAM N/A 02/19/2013  Procedure: ABDOMINAL Maxcine Ham;  Surgeon: [name], MD;  Location: [facility] CATH LAB;  Service: Cardiovascular;  Laterality: N/A; . CARDIAC CATHETERIZATION   . CARDIOVERSION N/A 06/16/2015  Procedure: CARDIOVERSION;  Surgeon: Satira Sark, MD;  Location: [facility] ORS;  Service: Cardiovascular;  Laterality: N/A; . COLONOSCOPY  08/18/2012  Procedure: COLONOSCOPY;  Surgeon: [name], MD;  [facility];  Service: Endoscopy;  Laterality: N/A;  1:25 . COLONOSCOPY N/A 09/12/2017  Procedure: COLONOSCOPY;  Surgeon: [name], MD;  [facility];  Service: Endoscopy;  Laterality: N/A; . CORONARY ARTERY BYPASS GRAFT  2003  5 grafts - details not clear . CRANIOTOMY  12/31/2011  Procedure: CRANIOTOMY HYPOPHYSECTOMY TRANSNASAL APPROACH;  Surgeon: [name], MD;  Location: [place] NEURO ORS;  Service: Neurosurgery;  Laterality: N/A;  Transphenoidal Hypophysectomy With Fat Graft Harvest from right abdomen  . ESOPHAGOGASTRODUODENOSCOPY  08/18/2012  Procedure: ESOPHAGOGASTRODUODENOSCOPY (EGD);  Surgeon: [name], MD;  [facility];  Service: Endoscopy;  Laterality: N/A; . ESOPHAGOGASTRODUODENOSCOPY N/A 03/31/2017  Procedure: ESOPHAGOGASTRODUODENOSCOPY (EGD);  Surgeon: [name], MD;  [facility];  Service: Endoscopy;  Laterality: N/A; . ESOPHAGOGASTRODUODENOSCOPY N/A 09/12/2017  Procedure: ESOPHAGOGASTRODUODENOSCOPY (EGD);  Surgeon: [name], MD;  [facility];  Service: Endoscopy;  Laterality: N/A;  10:40 . EYE SURGERY  2012 . GIVENS CAPSULE STUDY N/A 01/25/2013  Procedure: GIVENS CAPSULE STUDY;  Surgeon: [name], MD;  [facility];  Service: Endoscopy;  Laterality: N/A;  730 . HOT HEMOSTASIS  03/31/2017  Procedure: HOT HEMOSTASIS (ARGON PLASMA COAGULATION/BICAP);  Surgeon: [name], MD;  [facility];  Service: Endoscopy;;  duodenum . LARYNGOPLASTY Left 09/14/2016  Procedure: LARYNGOPLASTY;   Surgeon: [name], MD;  Location: [place];  Service: ENT;  Laterality: Left; . LOWER EXTREMITY ANGIOGRAM N/A 02/19/2013  Procedure: LOWER EXTREMITY ANGIOGRAM;  Surgeon: [name], MD;  Location: [facility] CATH LAB;  Service: Cardiovascular;  Laterality: N/A; . MICROLARYNGOSCOPY N/A 02/10/2018  Procedure: MICROLARYNGOSCOPY WITH VOCAL CORD INJECTION;  Surgeon: [name], MD;  Location: [place];  Service: ENT;  Laterality: N/A; . PERIPHERAL VASCULAR CATHETERIZATION N/A 01/19/2016  Procedure: Abdominal Aortogram;  Surgeon: Conrad Rio en Medio, MD;  Location: [place] CV LAB;  Service: Cardiovascular;  Laterality: N/A; . PERIPHERAL VASCULAR CATHETERIZATION Bilateral 01/19/2016  Procedure: Lower Extremity Angiography;  Surgeon: Conrad [place], MD;  Location: [place] CV LAB;  Service: Cardiovascular;  Laterality: Bilateral; . PV Angiogram  02/19/13  Indications: slow healing left calf ulcer . Stress Myocardial Perfusion  12/08/2011  Indications: Abnormal EKG, Eval of prior GABG . TEE WITHOUT CARDIOVERSION N/A 06/16/2015  Procedure: TRANSESOPHAGEAL ECHOCARDIOGRAM (TEE);  Surgeon: Satira Sark, MD;  [facility];  Service: Cardiovascular;  Laterality: N/A; . THYROIDECTOMY N/A 09/14/2016  Procedure: TOTAL THYROIDECTOMY;  Surgeon: [name], MD;  Location: [place];  Service: ENT;  Laterality: N/A; . 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 Also with total thyroidectomy and laryngoplasty. He presented to [facility] increasing SOB and hoarseness. He was taken to the OR on 3/15 by Dr. Lucia Gaskins for injection of left vocal cord to strengthen his voice. He presented today to Dr. Pollie Friar office and there was concern for dehydration and dysphagia so he was sent to [facility] for direct admission (actually observation). Since the procedure, he has been able to have any  solid or liquid food - it just won't go down or it goes down and comes right back up. Also unable to swallow any pills. He had the procedure Friday and did fine. He was told to go home with a soft diet and he was able to eat and drink that night. He ate breakfast ok Saturday but he wasn't able to eat after that. Dr. Shirlee More that on in-office exam the vocal cords are clear without swelling or infection but the patient was unable to swallow water in his office. He recommends IVF, labs, modified barium swallow evaluationfor esophageal obstruction/aspiration.  No Data Recorded Assessment / Plan / Recommendation CHL IP CLINICAL IMPRESSIONS 02/15/2018 Clinical Impression Pt demosntrates a severe cervical esophageal dysphagia with obstructing tissue at C5/6 and C7. Pt is unable to pass 1/4 teaspoon of thin liquids despite maximal effortful swallow or a head turn R or L. Chin tuck also ineffective. Attempted large bolus to adress if heavier propulsion could force more opening without success. 90% or more of the bolus returned to the pharynx and was aspriated with sensation. Pt does have a hard cough ability and can expectorate residue. Recommend pt remain fully NPO. Discussed result with MD, will need ENT to review study images and address needs. Pt may need a temporary  means of nutrition, though passage of any NG would likely be difficult even under fluoroscopy and could be contraindicated. Esophagram discontinued due to pt inability to consume any quantity of barium for further esophageal assessment.  SLP Visit Diagnosis Dysphagia, pharyngoesophageal phase (R13.14) Attention and concentration deficit following -- Frontal lobe and executive function deficit following -- Impact on safety and function Severe aspiration risk;Risk for inadequate nutrition/hydration   CHL IP TREATMENT RECOMMENDATION 02/15/2018 Treatment Recommendations Therapy as outlined in treatment plan below   Prognosis 02/15/2018 Prognosis for  Safe Diet Advancement Good Barriers to Reach Goals -- Barriers/Prognosis Comment -- CHL IP DIET RECOMMENDATION 02/15/2018 SLP Diet Recommendations NPO;Alternative means - temporary Liquid Administration via -- Medication Administration Via alternative means Compensations -- Postural Changes --   CHL IP OTHER RECOMMENDATIONS 02/15/2018 Recommended Consults Consider ENT evaluation Oral Care Recommendations -- Other Recommendations --   CHL IP FOLLOW UP RECOMMENDATIONS 02/15/2018 Follow up Recommendations 24 hour supervision/assistance   CHL IP FREQUENCY AND DURATION 02/15/2018 Speech Therapy Frequency (ACUTE ONLY) min 2x/week Treatment Duration 2 weeks      CHL IP ORAL PHASE 02/15/2018 Oral Phase WFL Oral - Pudding Teaspoon -- Oral - Pudding Cup -- Oral - Honey Teaspoon -- Oral - Honey Cup -- Oral - Nectar Teaspoon -- Oral - Nectar Cup -- Oral - Nectar Straw -- Oral - Thin Teaspoon -- Oral - Thin Cup -- Oral - Thin Straw -- Oral - Puree -- Oral - Mech Soft -- Oral - Regular -- Oral - Multi-Consistency -- Oral - Pill -- Oral Phase - Comment --  CHL IP PHARYNGEAL PHASE 02/15/2018 Pharyngeal Phase WFL Pharyngeal- Pudding Teaspoon -- Pharyngeal -- Pharyngeal- Pudding Cup -- Pharyngeal -- Pharyngeal- Honey Teaspoon -- Pharyngeal -- Pharyngeal- Honey Cup -- Pharyngeal -- Pharyngeal- Nectar Teaspoon -- Pharyngeal -- Pharyngeal- Nectar Cup -- Pharyngeal -- Pharyngeal- Nectar Straw -- Pharyngeal -- Pharyngeal- Thin Teaspoon -- Pharyngeal -- Pharyngeal- Thin Cup -- Pharyngeal -- Pharyngeal- Thin Straw -- Pharyngeal -- Pharyngeal- Puree -- Pharyngeal -- Pharyngeal- Mechanical Soft -- Pharyngeal -- Pharyngeal- Regular -- Pharyngeal -- Pharyngeal- Multi-consistency -- Pharyngeal -- Pharyngeal- Pill -- Pharyngeal -- Pharyngeal Comment --  CHL IP CERVICAL ESOPHAGEAL PHASE 02/15/2018 Cervical Esophageal Phase Impaired Pudding Teaspoon -- Pudding Cup -- Honey Teaspoon -- Honey Cup -- Nectar Teaspoon -- Nectar Cup -- Nectar Straw -- Thin  Teaspoon Reduced cricopharyngeal relaxation;Esophageal backflow into the pharynx;Esophageal backflow into the larynx Thin Cup -- Thin Straw Reduced cricopharyngeal relaxation;Esophageal backflow into the pharynx;Esophageal backflow into the larynx Puree -- Mechanical Soft -- Regular -- Multi-consistency -- Pill -- Cervical Esophageal Comment -- No flowsheet data found. De[place], Katherene Ponto 02/15/2018, 10:41 AM               Microbiology: No results found for this or any previous visit (from the past 240 hour(s)).   Labs: Basic Metabolic Panel: Recent Labs  Lab 02/25/18 0707 02/27/18 0451  NA 134* 134*  K 4.2 3.8  CL 102 100*  CO2 25 28  GLUCOSE 98 123*  BUN 24* 23*  CREATININE 0.97 0.83  CALCIUM 8.5* 8.8*   Liver Function Tests: No results for input(s): AST, ALT, ALKPHOS, BILITOT, PROT, ALBUMIN in the last 168 hours. No results for input(s): LIPASE, AMYLASE in the last 168 hours. No results for input(s): AMMONIA in the last 168 hours. CBC: Recent Labs  Lab 02/25/18 0707 02/27/18 0451 02/28/18 0427 03/01/18 1426  WBC 14.6* 13.1* 12.9* 13.4*  HGB 11.7* 10.8* 10.1* 9.6*  HCT 38.3* 34.9*  34.2* 32.1*  MCV 85.1 84.7 85.9 86.3  PLT 219 235 243 281   Cardiac Enzymes: No results for input(s): CKTOTAL, CKMB, CKMBINDEX, TROPONINI in the last 168 hours. BNP: BNP (last 3 results) Recent Labs    12/06/17 1232  BNP 305.0*    ProBNP (last 3 results) No results for input(s): PROBNP in the last 8760 hours.  CBG: Recent Labs  Lab 03/01/18 1705 03/01/18 2011 03/02/18 0001 03/02/18 0351 03/02/18 0752  GLUCAP 115* 98 97 113* 114*       Signed:  Desiree Hane, MD [facility] 03/02/2018, 10:16 AM

## 2018-03-02 NOTE — Progress Notes (Addendum)
Patient discharged to [facility] [place]. Report given to Curt Bears, Therapist, sports at [place]. All dressings clean, dry, and intact. Personal belongings sent with patient. Patient states he is ready to be discharged.

## 2018-03-02 NOTE — Clinical Social Work Placement (Signed)
   CLINICAL SOCIAL WORK PLACEMENT  NOTE Deborra [facility] Room A-6 RN to call report to [phone]  Date:  03/02/2018  Patient Details  Name: LIONARDO HAZE MRN: [id number] Date of Birth: 07-31-1936  Clinical Social Work is seeking post-discharge placement for this patient at the [place] level of care (*CSW will initial, date and re-position this form in  chart as items are completed):  Yes   Patient/family provided with [place] Work Department's list of facilities offering this level of care within the geographic area requested by the patient (or if unable, by the patient's family).  Yes   Patient/family informed of their freedom to choose among providers that offer the needed level of care, that participate in Medicare, Medicaid or managed care program needed by the patient, have an available bed and are willing to accept the patient.  Yes   Patient/family informed of [facility]'s ownership interest in [facility] and [facility], as well as of the fact that they are under no obligation to receive care at these facilities.  PASRR submitted to EDS on       PASRR number received on 02/23/18     Existing PASRR number confirmed on       FL2 transmitted to all facilities in geographic area requested by pt/family on 02/23/18     FL2 transmitted to all facilities within larger geographic area on       Patient informed that his/her managed care company has contracts with or will negotiate with certain facilities, including the following:        Yes   Patient/family informed of bed offers received.  Patient chooses bed at Other - please specify in the comment section below:([facility] )     Physician recommends and patient chooses bed at      Patient to be transferred to Other - please specify in the comment section below:([facility] ) on 03/02/18.  Patient to be transferred to facility by [facility]     Patient family  notified on 03/02/18 of transfer.  Name of family member notified:        PHYSICIAN Please sign DNR     Additional Comment:    _______________________________________________ Alexander Mt, [place] 03/02/2018, 11:31 AM

## 2018-03-02 NOTE — Social Work (Signed)
Clinical Social Worker facilitated patient discharge including contacting patient family and facility to confirm patient discharge plans.  Clinical information faxed to facility and family agreeable with plan.  CSW arranged ambulance transport via [facility] to [facility] [place] room A-6.  RN to call [phone] with report  prior to discharge.  Clinical Social Worker will sign off for now as social work intervention is no longer needed. Please consult [place] again if new need arises.  Alexander Mt, [place] Social Worker

## 2018-03-07 DIAGNOSIS — I480 Paroxysmal atrial fibrillation: Secondary | ICD-10-CM | POA: Diagnosis not present

## 2018-03-07 DIAGNOSIS — I10 Essential (primary) hypertension: Secondary | ICD-10-CM | POA: Diagnosis not present

## 2018-03-07 DIAGNOSIS — C73 Malignant neoplasm of thyroid gland: Secondary | ICD-10-CM | POA: Diagnosis not present

## 2018-03-07 DIAGNOSIS — E039 Hypothyroidism, unspecified: Secondary | ICD-10-CM | POA: Diagnosis not present

## 2018-03-08 DIAGNOSIS — G479 Sleep disorder, unspecified: Secondary | ICD-10-CM | POA: Diagnosis not present

## 2018-03-08 DIAGNOSIS — I480 Paroxysmal atrial fibrillation: Secondary | ICD-10-CM | POA: Diagnosis not present

## 2018-03-13 DIAGNOSIS — R600 Localized edema: Secondary | ICD-10-CM | POA: Diagnosis not present

## 2018-03-13 DIAGNOSIS — G629 Polyneuropathy, unspecified: Secondary | ICD-10-CM | POA: Diagnosis not present

## 2018-03-15 DIAGNOSIS — R600 Localized edema: Secondary | ICD-10-CM | POA: Diagnosis not present

## 2018-03-15 DIAGNOSIS — G629 Polyneuropathy, unspecified: Secondary | ICD-10-CM | POA: Diagnosis not present

## 2018-03-19 ENCOUNTER — Emergency Department (HOSPITAL_COMMUNITY): Payer: Medicare Other

## 2018-03-19 ENCOUNTER — Other Ambulatory Visit: Payer: Self-pay

## 2018-03-19 ENCOUNTER — Encounter (HOSPITAL_COMMUNITY): Payer: Self-pay | Admitting: Radiology

## 2018-03-19 ENCOUNTER — Inpatient Hospital Stay (HOSPITAL_COMMUNITY)
Admission: EM | Admit: 2018-03-19 | Discharge: 2018-03-28 | DRG: 291 | Disposition: A | Discharge status: Skilled Nursing Facility | Payer: Medicare Other | Attending: Pulmonary Disease | Admitting: Pulmonary Disease

## 2018-03-19 DIAGNOSIS — F05 Delirium due to known physiological condition: Secondary | ICD-10-CM | POA: Diagnosis not present

## 2018-03-19 DIAGNOSIS — Q000 Anencephaly: Secondary | ICD-10-CM | POA: Diagnosis not present

## 2018-03-19 DIAGNOSIS — I493 Ventricular premature depolarization: Secondary | ICD-10-CM | POA: Diagnosis present

## 2018-03-19 DIAGNOSIS — Z85850 Personal history of malignant neoplasm of thyroid: Secondary | ICD-10-CM

## 2018-03-19 DIAGNOSIS — E1151 Type 2 diabetes mellitus with diabetic peripheral angiopathy without gangrene: Secondary | ICD-10-CM | POA: Diagnosis present

## 2018-03-19 DIAGNOSIS — E785 Hyperlipidemia, unspecified: Secondary | ICD-10-CM | POA: Diagnosis present

## 2018-03-19 DIAGNOSIS — M6281 Muscle weakness (generalized): Secondary | ICD-10-CM | POA: Diagnosis not present

## 2018-03-19 DIAGNOSIS — Z951 Presence of aortocoronary bypass graft: Secondary | ICD-10-CM | POA: Diagnosis not present

## 2018-03-19 DIAGNOSIS — R0609 Other forms of dyspnea: Secondary | ICD-10-CM | POA: Diagnosis not present

## 2018-03-19 DIAGNOSIS — I340 Nonrheumatic mitral (valve) insufficiency: Secondary | ICD-10-CM | POA: Diagnosis not present

## 2018-03-19 DIAGNOSIS — R0602 Shortness of breath: Secondary | ICD-10-CM

## 2018-03-19 DIAGNOSIS — I472 Ventricular tachycardia: Secondary | ICD-10-CM | POA: Diagnosis present

## 2018-03-19 DIAGNOSIS — Z931 Gastrostomy status: Secondary | ICD-10-CM | POA: Diagnosis not present

## 2018-03-19 DIAGNOSIS — R2681 Unsteadiness on feet: Secondary | ICD-10-CM | POA: Diagnosis not present

## 2018-03-19 DIAGNOSIS — I2510 Atherosclerotic heart disease of native coronary artery without angina pectoris: Secondary | ICD-10-CM | POA: Diagnosis present

## 2018-03-19 DIAGNOSIS — J383 Other diseases of vocal cords: Secondary | ICD-10-CM | POA: Diagnosis present

## 2018-03-19 DIAGNOSIS — I25810 Atherosclerosis of coronary artery bypass graft(s) without angina pectoris: Secondary | ICD-10-CM | POA: Diagnosis not present

## 2018-03-19 DIAGNOSIS — Z79899 Other long term (current) drug therapy: Secondary | ICD-10-CM

## 2018-03-19 DIAGNOSIS — J438 Other emphysema: Secondary | ICD-10-CM

## 2018-03-19 DIAGNOSIS — Z66 Do not resuscitate: Secondary | ICD-10-CM | POA: Diagnosis present

## 2018-03-19 DIAGNOSIS — E039 Hypothyroidism, unspecified: Secondary | ICD-10-CM | POA: Diagnosis not present

## 2018-03-19 DIAGNOSIS — J9621 Acute and chronic respiratory failure with hypoxia: Secondary | ICD-10-CM | POA: Diagnosis not present

## 2018-03-19 DIAGNOSIS — R0902 Hypoxemia: Secondary | ICD-10-CM | POA: Diagnosis not present

## 2018-03-19 DIAGNOSIS — L8990 Pressure ulcer of unspecified site, unspecified stage: Secondary | ICD-10-CM | POA: Diagnosis present

## 2018-03-19 DIAGNOSIS — E890 Postprocedural hypothyroidism: Secondary | ICD-10-CM | POA: Diagnosis present

## 2018-03-19 DIAGNOSIS — K219 Gastro-esophageal reflux disease without esophagitis: Secondary | ICD-10-CM | POA: Diagnosis present

## 2018-03-19 DIAGNOSIS — Z8249 Family history of ischemic heart disease and other diseases of the circulatory system: Secondary | ICD-10-CM

## 2018-03-19 DIAGNOSIS — I5033 Acute on chronic diastolic (congestive) heart failure: Secondary | ICD-10-CM | POA: Diagnosis present

## 2018-03-19 DIAGNOSIS — J449 Chronic obstructive pulmonary disease, unspecified: Secondary | ICD-10-CM | POA: Diagnosis not present

## 2018-03-19 DIAGNOSIS — I480 Paroxysmal atrial fibrillation: Secondary | ICD-10-CM | POA: Diagnosis not present

## 2018-03-19 DIAGNOSIS — R0682 Tachypnea, not elsewhere classified: Secondary | ICD-10-CM | POA: Diagnosis not present

## 2018-03-19 DIAGNOSIS — J9601 Acute respiratory failure with hypoxia: Secondary | ICD-10-CM | POA: Diagnosis not present

## 2018-03-19 DIAGNOSIS — I10 Essential (primary) hypertension: Secondary | ICD-10-CM | POA: Diagnosis not present

## 2018-03-19 DIAGNOSIS — R0600 Dyspnea, unspecified: Secondary | ICD-10-CM | POA: Diagnosis not present

## 2018-03-19 DIAGNOSIS — R0603 Acute respiratory distress: Secondary | ICD-10-CM

## 2018-03-19 DIAGNOSIS — Z87891 Personal history of nicotine dependence: Secondary | ICD-10-CM

## 2018-03-19 DIAGNOSIS — Z743 Need for continuous supervision: Secondary | ICD-10-CM | POA: Diagnosis not present

## 2018-03-19 DIAGNOSIS — J441 Chronic obstructive pulmonary disease with (acute) exacerbation: Secondary | ICD-10-CM | POA: Diagnosis present

## 2018-03-19 DIAGNOSIS — R7989 Other specified abnormal findings of blood chemistry: Secondary | ICD-10-CM

## 2018-03-19 DIAGNOSIS — I252 Old myocardial infarction: Secondary | ICD-10-CM | POA: Diagnosis not present

## 2018-03-19 DIAGNOSIS — E872 Acidosis, unspecified: Secondary | ICD-10-CM | POA: Diagnosis present

## 2018-03-19 DIAGNOSIS — Z79890 Hormone replacement therapy (postmenopausal): Secondary | ICD-10-CM

## 2018-03-19 DIAGNOSIS — I110 Hypertensive heart disease with heart failure: Principal | ICD-10-CM | POA: Diagnosis present

## 2018-03-19 DIAGNOSIS — R778 Other specified abnormalities of plasma proteins: Secondary | ICD-10-CM | POA: Diagnosis present

## 2018-03-19 DIAGNOSIS — C73 Malignant neoplasm of thyroid gland: Secondary | ICD-10-CM | POA: Diagnosis not present

## 2018-03-19 DIAGNOSIS — J3800 Paralysis of vocal cords and larynx, unspecified: Secondary | ICD-10-CM | POA: Diagnosis not present

## 2018-03-19 DIAGNOSIS — E43 Unspecified severe protein-calorie malnutrition: Secondary | ICD-10-CM | POA: Diagnosis not present

## 2018-03-19 DIAGNOSIS — R262 Difficulty in walking, not elsewhere classified: Secondary | ICD-10-CM | POA: Diagnosis not present

## 2018-03-19 DIAGNOSIS — Z7901 Long term (current) use of anticoagulants: Secondary | ICD-10-CM | POA: Diagnosis not present

## 2018-03-19 DIAGNOSIS — E1142 Type 2 diabetes mellitus with diabetic polyneuropathy: Secondary | ICD-10-CM | POA: Diagnosis present

## 2018-03-19 DIAGNOSIS — R1314 Dysphagia, pharyngoesophageal phase: Secondary | ICD-10-CM | POA: Diagnosis not present

## 2018-03-19 DIAGNOSIS — R748 Abnormal levels of other serum enzymes: Secondary | ICD-10-CM | POA: Diagnosis not present

## 2018-03-19 DIAGNOSIS — I509 Heart failure, unspecified: Secondary | ICD-10-CM | POA: Diagnosis not present

## 2018-03-19 LAB — COMPREHENSIVE METABOLIC PANEL
ALBUMIN: 3.1 g/dL — AB (ref 3.5–5.0)
ALT: 25 U/L (ref 17–63)
ANION GAP: 11 (ref 5–15)
AST: 27 U/L (ref 15–41)
Alkaline Phosphatase: 70 U/L (ref 38–126)
BUN: 22 mg/dL — AB (ref 6–20)
CHLORIDE: 98 mmol/L — AB (ref 101–111)
CO2: 28 mmol/L (ref 22–32)
Calcium: 9 mg/dL (ref 8.9–10.3)
Creatinine, Ser: 0.93 mg/dL (ref 0.61–1.24)
GFR calc Af Amer: >60 mL/min (ref >60)
GFR calc non Af Amer: >60 mL/min (ref >60)
GLUCOSE: 114 mg/dL — AB (ref 65–99)
POTASSIUM: 4.5 mmol/L (ref 3.5–5.1)
SODIUM: 137 mmol/L (ref 135–145)
TOTAL PROTEIN: 6.1 g/dL — AB (ref 6.5–8.1)
Total Bilirubin: 0.7 mg/dL (ref 0.3–1.2)

## 2018-03-19 LAB — BLOOD GAS, ARTERIAL
Acid-Base Excess: 4.5 mmol/L — ABNORMAL HIGH (ref 0.0–2.0)
Bicarbonate: 28.3 mmol/L — ABNORMAL HIGH (ref 20.0–28.0)
DELIVERY SYSTEMS: POSITIVE
DRAWN BY: 22179
Expiratory PAP: 8
FIO2: 55
INSPIRATORY PAP: 16
LHR: 16 {breaths}/min
O2 Saturation: 97.6 %
pCO2 arterial: 42.5 mmHg (ref 32.0–48.0)
pH, Arterial: 7.441 (ref 7.350–7.450)
pO2, Arterial: 95.9 mmHg (ref 83.0–108.0)

## 2018-03-19 LAB — URINALYSIS, ROUTINE W REFLEX MICROSCOPIC
Bilirubin Urine: NEGATIVE
Glucose, UA: NEGATIVE mg/dL
Ketones, ur: NEGATIVE mg/dL
Nitrite: NEGATIVE
PH: 6 (ref 5.0–8.0)
Protein, ur: NEGATIVE mg/dL
SPECIFIC GRAVITY, URINE: 1.008 (ref 1.005–1.030)
SQUAMOUS EPITHELIAL / LPF: NONE SEEN

## 2018-03-19 LAB — TSH: TSH: 6.26 u[IU]/mL — ABNORMAL HIGH (ref 0.350–4.500)

## 2018-03-19 LAB — CBC WITH DIFFERENTIAL/PLATELET
BASOS ABS: 0 10*3/uL (ref 0.0–0.1)
Basophils Relative: 0 %
EOS PCT: 2 %
Eosinophils Absolute: 0.2 10*3/uL (ref 0.0–0.7)
HEMATOCRIT: 32.1 % — AB (ref 39.0–52.0)
Hemoglobin: 9.5 g/dL — ABNORMAL LOW (ref 13.0–17.0)
LYMPHS ABS: 0.8 10*3/uL (ref 0.7–4.0)
LYMPHS PCT: 10 %
MCH: 27.3 pg (ref 26.0–34.0)
MCHC: 29.6 g/dL — ABNORMAL LOW (ref 30.0–36.0)
MCV: 92.2 fL (ref 78.0–100.0)
Monocytes Absolute: 0.6 10*3/uL (ref 0.1–1.0)
Monocytes Relative: 7 %
NEUTROS ABS: 6.6 10*3/uL (ref 1.7–7.7)
Neutrophils Relative %: 81 %
PLATELETS: 329 10*3/uL (ref 150–400)
RBC: 3.48 MIL/uL — AB (ref 4.22–5.81)
RDW: 19.2 % — ABNORMAL HIGH (ref 11.5–15.5)
WBC: 8.2 10*3/uL (ref 4.0–10.5)

## 2018-03-19 LAB — APTT: aPTT: 43 seconds — ABNORMAL HIGH (ref 24–36)

## 2018-03-19 LAB — MAGNESIUM: MAGNESIUM: 2.3 mg/dL (ref 1.7–2.4)

## 2018-03-19 LAB — I-STAT CG4 LACTIC ACID, ED
LACTIC ACID, VENOUS: 2.15 mmol/L — AB (ref 0.5–1.9)
Lactic Acid, Venous: 4.24 mmol/L (ref 0.5–1.9)

## 2018-03-19 LAB — PROTIME-INR
INR: 1.39
Prothrombin Time: 17 seconds — ABNORMAL HIGH (ref 11.4–15.2)

## 2018-03-19 LAB — TROPONIN I
TROPONIN I: 0.24 ng/mL — AB (ref <0.03)
Troponin I: 0.29 ng/mL (ref <0.03)

## 2018-03-19 LAB — BRAIN NATRIURETIC PEPTIDE: B Natriuretic Peptide: 395 pg/mL — ABNORMAL HIGH (ref 0.0–100.0)

## 2018-03-19 MED ORDER — ASPIRIN EC 81 MG PO TBEC
81.0000 mg | DELAYED_RELEASE_TABLET | Freq: Every day | ORAL | Status: DC
  Administered 2018-03-21: 81 mg via ORAL
  Filled 2018-03-19: qty 1

## 2018-03-19 MED ORDER — METOPROLOL TARTRATE 25 MG PO TABS: 12.5000 mg | ORAL_TABLET | Freq: Two times a day (BID) | ORAL | Status: DC

## 2018-03-19 MED ORDER — HEPARIN BOLUS VIA INFUSION: 2000.0000 [IU] | Freq: Once | INTRAVENOUS | Status: DC

## 2018-03-19 MED ORDER — FUROSEMIDE 10 MG/ML IJ SOLN
20.0000 mg | Freq: Two times a day (BID) | INTRAMUSCULAR | Status: DC
  Administered 2018-03-20 – 2018-03-21 (×3): 20 mg via INTRAVENOUS
  Filled 2018-03-19 (×3): qty 2

## 2018-03-19 MED ORDER — SIMVASTATIN 20 MG PO TABS
20.0000 mg | ORAL_TABLET | Freq: Every day | ORAL | Status: DC
  Administered 2018-03-19 – 2018-03-27 (×9): 20 mg
  Filled 2018-03-19: qty 1
  Filled 2018-03-19: qty 2
  Filled 2018-03-19 (×2): qty 1
  Filled 2018-03-19: qty 2
  Filled 2018-03-19 (×2): qty 1
  Filled 2018-03-19 (×2): qty 2
  Filled 2018-03-19: qty 1

## 2018-03-19 MED ORDER — NITROGLYCERIN IN D5W 200-5 MCG/ML-% IV SOLN: 5.0000 ug/min | Freq: Once | INTRAVENOUS | Status: DC

## 2018-03-19 MED ORDER — MIRTAZAPINE 15 MG PO TABS
15.0000 mg | ORAL_TABLET | Freq: Every day | ORAL | Status: DC
  Administered 2018-03-19 – 2018-03-27 (×9): 15 mg
  Filled 2018-03-19 (×9): qty 1

## 2018-03-19 MED ORDER — FREE WATER
200.0000 mL | Freq: Four times a day (QID) | Status: DC
  Administered 2018-03-19 – 2018-03-28 (×34): 200 mL

## 2018-03-19 MED ORDER — BUDESONIDE 0.5 MG/2ML IN SUSP
0.5000 mg | Freq: Two times a day (BID) | RESPIRATORY_TRACT | Status: DC
  Administered 2018-03-19 – 2018-03-27 (×16): 0.5 mg via RESPIRATORY_TRACT
  Filled 2018-03-19 (×16): qty 2

## 2018-03-19 MED ORDER — JEVITY 1.2 CAL PO LIQD
ORAL | Status: AC
  Filled 2018-03-19: qty 237

## 2018-03-19 MED ORDER — VANCOMYCIN HCL 10 G IV SOLR
1500.0000 mg | Freq: Once | INTRAVENOUS | Status: AC
  Administered 2018-03-19: 1500 mg via INTRAVENOUS
  Filled 2018-03-19 (×2): qty 1500

## 2018-03-19 MED ORDER — LEVOTHYROXINE SODIUM 50 MCG PO TABS
75.0000 ug | ORAL_TABLET | Freq: Every day | ORAL | Status: DC
  Filled 2018-03-19: qty 1.5

## 2018-03-19 MED ORDER — PRO-STAT SUGAR FREE PO LIQD
30.0000 mL | Freq: Two times a day (BID) | ORAL | Status: DC
  Administered 2018-03-19 – 2018-03-28 (×18): 30 mL
  Filled 2018-03-19 (×18): qty 30

## 2018-03-19 MED ORDER — SODIUM CHLORIDE 0.9% FLUSH
3.0000 mL | Freq: Two times a day (BID) | INTRAVENOUS | Status: DC
  Administered 2018-03-19 – 2018-03-28 (×18): 3 mL via INTRAVENOUS

## 2018-03-19 MED ORDER — SODIUM CHLORIDE 0.9% FLUSH
3.0000 mL | INTRAVENOUS | Status: DC | PRN
Start: 2018-03-19 — End: 2018-03-28

## 2018-03-19 MED ORDER — METOPROLOL TARTRATE 25 MG PO TABS
12.5000 mg | ORAL_TABLET | Freq: Two times a day (BID) | ORAL | Status: DC
  Administered 2018-03-20 – 2018-03-28 (×16): 12.5 mg
  Filled 2018-03-19 (×16): qty 1

## 2018-03-19 MED ORDER — VANCOMYCIN HCL IN DEXTROSE 1-5 GM/200ML-% IV SOLN: 1000.0000 mg | Freq: Two times a day (BID) | INTRAVENOUS | Status: DC

## 2018-03-19 MED ORDER — LACTATED RINGERS IV BOLUS
1000.0000 mL | Freq: Once | INTRAVENOUS | Status: DC
  Administered 2018-03-19: 1000 mL via INTRAVENOUS

## 2018-03-19 MED ORDER — FOLIC ACID 1 MG PO TABS
1.0000 mg | ORAL_TABLET | Freq: Every day | ORAL | Status: DC
  Administered 2018-03-20 – 2018-03-28 (×9): 1 mg
  Filled 2018-03-19 (×9): qty 1

## 2018-03-19 MED ORDER — WHITE PETROLATUM GEL
1.0000 "application " | Status: DC | PRN
  Filled 2018-03-19: qty 28.35

## 2018-03-19 MED ORDER — MAGNESIUM SULFATE 2 GM/50ML IV SOLN
2.0000 g | Freq: Once | INTRAVENOUS | Status: AC
  Administered 2018-03-19: 2 g via INTRAVENOUS
  Filled 2018-03-19: qty 50

## 2018-03-19 MED ORDER — PANTOPRAZOLE SODIUM 40 MG PO TBEC
40.0000 mg | DELAYED_RELEASE_TABLET | Freq: Every day | ORAL | Status: DC
  Administered 2018-03-21: 40 mg via ORAL
  Filled 2018-03-19 (×2): qty 1

## 2018-03-19 MED ORDER — ASPIRIN 81 MG PO CHEW
324.0000 mg | CHEWABLE_TABLET | Freq: Once | ORAL | Status: AC
  Administered 2018-03-19: 324 mg via ORAL
  Filled 2018-03-19: qty 4

## 2018-03-19 MED ORDER — FUROSEMIDE 10 MG/ML IJ SOLN
80.0000 mg | Freq: Once | INTRAMUSCULAR | Status: AC
  Administered 2018-03-19: 80 mg via INTRAVENOUS
  Filled 2018-03-19: qty 8

## 2018-03-19 MED ORDER — NITROGLYCERIN 0.4 MG SL SUBL: 0.4000 mg | SUBLINGUAL_TABLET | SUBLINGUAL | Status: DC | PRN

## 2018-03-19 MED ORDER — ONDANSETRON HCL 4 MG/2ML IJ SOLN
4.0000 mg | Freq: Four times a day (QID) | INTRAMUSCULAR | Status: DC | PRN
  Administered 2018-03-24: 4 mg via INTRAVENOUS
  Filled 2018-03-19: qty 2

## 2018-03-19 MED ORDER — METHYLPREDNISOLONE SODIUM SUCC 125 MG IJ SOLR
125.0000 mg | Freq: Once | INTRAMUSCULAR | Status: AC
  Administered 2018-03-19: 125 mg via INTRAVENOUS
  Filled 2018-03-19: qty 2

## 2018-03-19 MED ORDER — MIRTAZAPINE 15 MG PO TABS: 15.0000 mg | ORAL_TABLET | Freq: Every day | ORAL | Status: DC

## 2018-03-19 MED ORDER — PIPERACILLIN-TAZOBACTAM 3.375 G IVPB 30 MIN
3.3750 g | Freq: Once | INTRAVENOUS | Status: AC
  Administered 2018-03-19: 3.375 g via INTRAVENOUS
  Filled 2018-03-19: qty 50

## 2018-03-19 MED ORDER — MORPHINE SULFATE (PF) 2 MG/ML IV SOLN
1.0000 mg | INTRAVENOUS | Status: DC | PRN
  Administered 2018-03-21 (×2): 1 mg via INTRAVENOUS
  Filled 2018-03-19 (×2): qty 1

## 2018-03-19 MED ORDER — IPRATROPIUM-ALBUTEROL 0.5-2.5 (3) MG/3ML IN SOLN
3.0000 mL | RESPIRATORY_TRACT | Status: DC | PRN
  Administered 2018-03-21 (×2): 3 mL via RESPIRATORY_TRACT
  Filled 2018-03-19 (×2): qty 3

## 2018-03-19 MED ORDER — PIPERACILLIN-TAZOBACTAM 3.375 G IVPB
3.3750 g | Freq: Three times a day (TID) | INTRAVENOUS | Status: DC
Start: 2018-03-19 — End: 2018-03-19

## 2018-03-19 MED ORDER — LEVOTHYROXINE SODIUM 75 MCG PO TABS
75.0000 ug | ORAL_TABLET | Freq: Every day | ORAL | Status: DC
  Administered 2018-03-20 – 2018-03-28 (×9): 75 ug
  Filled 2018-03-19: qty 1.5
  Filled 2018-03-19: qty 1
  Filled 2018-03-19 (×2): qty 1.5
  Filled 2018-03-19 (×2): qty 1
  Filled 2018-03-19 (×2): qty 1.5
  Filled 2018-03-19: qty 3
  Filled 2018-03-19: qty 1
  Filled 2018-03-19: qty 3
  Filled 2018-03-19: qty 1
  Filled 2018-03-19: qty 3
  Filled 2018-03-19: qty 1

## 2018-03-19 MED ORDER — JEVITY 1.2 CAL PO LIQD
1000.0000 mL | ORAL | Status: DC
  Administered 2018-03-20: 65 mL
  Administered 2018-03-20 – 2018-03-25 (×4): 1000 mL
  Filled 2018-03-19 (×16): qty 1000

## 2018-03-19 MED ORDER — ALBUTEROL (5 MG/ML) CONTINUOUS INHALATION SOLN
15.0000 mg/h | INHALATION_SOLUTION | RESPIRATORY_TRACT | Status: DC
  Administered 2018-03-19: 15 mg/h via RESPIRATORY_TRACT
  Filled 2018-03-19: qty 20

## 2018-03-19 MED ORDER — HEPARIN SODIUM (PORCINE) 5000 UNIT/ML IJ SOLN
5000.0000 [IU] | Freq: Three times a day (TID) | INTRAMUSCULAR | Status: DC
  Administered 2018-03-19 – 2018-03-21 (×5): 5000 [IU] via SUBCUTANEOUS
  Filled 2018-03-19 (×5): qty 1

## 2018-03-19 MED ORDER — ACETAMINOPHEN 325 MG PO TABS: 650.0000 mg | ORAL_TABLET | ORAL | Status: DC | PRN

## 2018-03-19 MED ORDER — IPRATROPIUM BROMIDE 0.02 % IN SOLN
1.0000 mg | Freq: Once | RESPIRATORY_TRACT | Status: AC
  Administered 2018-03-19: 1 mg via RESPIRATORY_TRACT
  Filled 2018-03-19: qty 5

## 2018-03-19 MED ORDER — SODIUM CHLORIDE 0.9 % IV SOLN: 250.0000 mL | INTRAVENOUS | Status: DC | PRN

## 2018-03-19 MED ORDER — HEPARIN (PORCINE) IN NACL 100-0.45 UNIT/ML-% IJ SOLN: 900.0000 [IU]/h | INTRAMUSCULAR | Status: DC

## 2018-03-19 MED ORDER — SIMVASTATIN 10 MG PO TABS
20.0000 mg | ORAL_TABLET | Freq: Every day | ORAL | Status: DC
  Filled 2018-03-19: qty 2

## 2018-03-19 MED ORDER — FOLIC ACID 1 MG PO TABS: 1.0000 mg | ORAL_TABLET | Freq: Every day | ORAL | Status: DC

## 2018-03-19 NOTE — ED Notes (Signed)
CRITICAL VALUE ALERT  Critical Value:  Lactic 4.24  Date & Time Notied:  4 21 19  1751  Provider Notified: Notified Dr. Dayna Barker  Orders Received/Actions taken: No orders yet

## 2018-03-19 NOTE — Progress Notes (Addendum)
ANTICOAGULATION CONSULT NOTE - Initial Consult  Pharmacy Consult for HEPARIN Indication: chest pain/ACS  Allergies  Allergen Reactions  . Morphine And Related Nausea And Vomiting   Patient Measurements: Weight: 187 lb (84.8 kg)    Vital Signs: BP: 193/161 (04/21 1700) Pulse Rate: 70 (04/21 1700)  Labs: Recent Labs    03/19/18 1516  HGB 9.5*  HCT 32.1*  PLT 329  CREATININE 0.93  TROPONINI 0.29*    Estimated Creatinine Clearance: 63.4 mL/min (by C-G formula based on SCr of 0.93 mg/dL).   Medical History: Past Medical History:  Diagnosis Date  . Atrial flutter ([place])    on Eliquis  . AV malformation of GI tract   . COPD (chronic obstructive pulmonary disease) ([place]) 3.12.14   2D Echo, EF 50-55%  . Coronary atherosclerosis of native coronary artery    Multivessel status post CABG  . Gastric ulcer    Small - nonbleeding  . GERD (gastroesophageal reflux disease)   . Headache(784.0)   . Hypothyroidism   . Iron deficiency anemia    Negative Givens capsule study   . Myocardial infarction ([place])   . PAD (peripheral artery disease) (H[facility])    Moderate bilateral SFA disease at angiography 01/2013  . Pituitary macroadenoma ([place])   . Thyroid cancer ([place])   . Vocal cord paralysis    left    Medications:   (Not in a hospital admission)  Assessment: 82yo male c/o SOB.  Pharmacy asked to initiate Heparin for ACS.  Pt reportedly on Eliquis PTA, baseline labs noted.  Last dose of Eliquis unknown / not reported.  May need to use aPTT to manage Heparin Rx.   Goal of Therapy:  Heparin level 0.3-0.7 units/ml or aPTT 62-103 Monitor platelets by anticoagulation protocol: Yes   Plan:  Heparin 2000 units x 1 (bolus) Heparin infusion at 900 units/hr Await baseline labs before ordering bolus (may not be warranted) Aptt, heparin level, and CBC daily  Hart Robinsons A 03/19/2018,5:27 PM

## 2018-03-19 NOTE — ED Notes (Addendum)
Per Dr. Dyann Kief, hold off on Nitro and Heparin until he gives further orders

## 2018-03-19 NOTE — H&P (Signed)
History and Physical    John Sampson XLK:[id number] DOB: 02/04/1936 DOA: 03/19/2018  PCP: Sinda Du, MD   I have briefly reviewed patients previous medical reports in [facility].  Patient coming from: SNF  Chief Complaint: SOB and hypoxia  HPI: John Sampson is a 82 y/o man with PMH significant for multivessel CAD (S/P CABG), PAF (chronically on Eliquis), diastolic CHF, chronic resp failure due to COPD, thyroid cancer (status post thyroidectomy and laryngoplasty 2017), hypothyroidism, HTN, vocal cord dysfunction and recent admission due to dysphagia (which led to concerns for thyroid cancer recurrence affecting his esophagus). Patient was sent to SNF for rehabilitation and further care and on day of admission experienced SOB, hypoxia (despite 2L [place] supplementation) and also some orthopnea. There has not been fever, chills, CP, palpitations, nausea, vomiting, abd pain, dysuria or hematuria. Patient was placed on NRB by EMS and on his arrival to ED started on BIPAP (with improvement in his SOB).  ED Course: initially was thought that he might have developed HCAP and COPD exacerbation; but patient wasn't wheezing, had crackles on exam, CXR demonstrated vascular congestion and pulmonary edema. Patient's BNP also elevated. Patient also has elevated troponin and lactic acidosis.  Of note, he received IV solumedrol, hour long nebulization with albuterol, IVF's and antibiotics. Once diagnosis better clarify with blood work and x-ray he received IV lasix.  Review of Systems:  All other systems reviewed and apart from HPI, are negative.  Past Medical History:  Diagnosis Date  . Atrial flutter ([place])    on Eliquis  . AV malformation of GI tract   . COPD (chronic obstructive pulmonary disease) ([place]) 3.12.14   2D Echo, EF 50-55%  . Coronary atherosclerosis of native coronary artery    Multivessel status post CABG  . Gastric ulcer    Small - nonbleeding  . GERD (gastroesophageal reflux  disease)   . Headache(784.0)   . Hypothyroidism   . Iron deficiency anemia    Negative Givens capsule study   . Myocardial infarction ([place])   . PAD (peripheral artery disease) (HCC)    Moderate bilateral SFA disease at angiography 01/2013  . Pituitary macroadenoma ([place])   . Thyroid cancer ([place])   . Vocal cord paralysis    left    Past Surgical History:  Procedure Laterality Date  . ABDOMINAL AORTAGRAM N/A 02/19/2013   Procedure: ABDOMINAL Maxcine Ham;  Surgeon: [name], MD;  Location: [facility] CATH LAB;  Service: Cardiovascular;  Laterality: N/A;  . CARDIAC CATHETERIZATION    . CARDIOVERSION N/A 06/16/2015   Procedure: CARDIOVERSION;  Surgeon: Satira Sark, MD;  [facility]: [facility] ORS;  Service: Cardiovascular;  Laterality: N/A;  . COLONOSCOPY  08/18/2012   Procedure: COLONOSCOPY;  Surgeon: [name], MD;  [facility] ENDO SU[facility];  Service: Endoscopy;  Laterality: N/A;  1:25  . COLONOSCOPY N/A 09/12/2017   Procedure: COLONOSCOPY;  Surgeon: [name], MD;  [facility] ENDO [facility];  Service: Endoscopy;  Laterality: N/A;  . CORONARY ARTERY BYPASS GRAFT  2003   5 grafts - details not clear  . CRANIOTOMY  12/31/2011   Procedure: CRANIOTOMY HYPOPHYSECTOMY TRANSNASAL APPROACH;  Surgeon: [name], MD;  Location: [facility] NEURO ORS;  Service: Neurosurgery;  Laterality: N/A;  Transphenoidal Hypophysectomy With Fat Graft Harvest from right abdomen   . ESOPHAGOGASTRODUODENOSCOPY  08/18/2012   Procedure: ESOPHAGOGASTRODUODENOSCOPY (EGD);  Surgeon: [name], MD;  [facility];  Service: Endoscopy;  Laterality: N/A;  . ESOPHAGOGASTRODUODENOSCOPY N/A 03/31/2017  Procedure: ESOPHAGOGASTRODUODENOSCOPY (EGD);  Surgeon: [name], MD;  [facility];  Service: Endoscopy;  Laterality: N/A;  . ESOPHAGOGASTRODUODENOSCOPY N/A 09/12/2017   Procedure: ESOPHAGOGASTRODUODENOSCOPY (EGD);  Surgeon: [name], MD;  [facility];  Service: Endoscopy;   Laterality: N/A;  10:40  . EYE SURGERY  2012  . GIVENS CAPSULE STUDY N/A 01/25/2013   Procedure: GIVENS CAPSULE STUDY;  Surgeon: [name], MD;  [facility];  Service: Endoscopy;  Laterality: N/A;  730  . HOT HEMOSTASIS  03/31/2017   Procedure: HOT HEMOSTASIS (ARGON PLASMA COAGULATION/BICAP);  Surgeon: [name], MD;  [facility];  Service: Endoscopy;;  duodenum  . IR GASTROSTOMY TUBE MOD SED  02/24/2018  . LARYNGOPLASTY Left 09/14/2016   Procedure: LARYNGOPLASTY;  Surgeon: [name], MD;  Location: [place];  Service: ENT;  Laterality: Left;  . LOWER EXTREMITY ANGIOGRAM N/A 02/19/2013   Procedure: LOWER EXTREMITY ANGIOGRAM;  Surgeon: [name], MD;  Location[facility] CATH LAB;  Service: Cardiovascular;  Laterality: N/A;  . MICROLARYNGOSCOPY N/A 02/10/2018   Procedure: MICROLARYNGOSCOPY WITH VOCAL CORD INJECTION;  Surgeon: [name], MD;  Location: [place];  Service: ENT;  Laterality: N/A;  . PERIPHERAL VASCULAR CATHETERIZATION N/A 01/19/2016   Procedure: Abdominal Aortogram;  Surgeon: Conrad [place], MD;  Location: [place] CV LAB;  Service: Cardiovascular;  Laterality: N/A;  . PERIPHERAL VASCULAR CATHETERIZATION Bilateral 01/19/2016   Procedure: Lower Extremity Angiography;  Surgeon: Conrad [place], MD;  Location: [place] CV LAB;  Service: Cardiovascular;  Laterality: Bilateral;  . PV Angiogram  02/19/13   Indications: slow healing left calf ulcer  . Stress Myocardial Perfusion  12/08/2011   Indications: Abnormal EKG, Eval of prior GABG  . TEE WITHOUT CARDIOVERSION N/A 06/16/2015   Procedure: TRANSESOPHAGEAL ECHOCARDIOGRAM (TEE);  Surgeon: Satira Sark, MD;  Location: [facility];  Service: Cardiovascular;  Laterality: N/A;  . THYROIDECTOMY N/A 09/14/2016   Procedure: TOTAL THYROIDECTOMY;  Surgeon: [name], MD;  Location: [place];  Service: ENT;  Laterality: N/A;  . TONSILLECTOMY  Age 23    Social History   reports that he quit smoking about 16 years ago. His smoking use included cigarettes. He started smoking about 63 years ago. He has a 135.00 pack-year smoking history. He has never used smokeless tobacco. He reports that he does not drink alcohol or use drugs.  Allergies  Allergen Reactions  . Morphine And Related Nausea And Vomiting    Family History  Problem Relation Age of Onset  . CAD Mother   . Heart disease Father        before age 58    Prior to Admission medications   Medication Sig Start Date End Date Taking? Authorizing Provider  albuterol (PROVENTIL HFA) 108 (90 Base) MCG/ACT inhaler Inhale 2 puffs into the lungs every 6 (six) hours as needed for wheezing or shortness of breath. 03/02/18   Desiree Hane, MD  Amino Acids-Protein Hydrolys (FEEDING SUPPLEMENT, PRO-STAT SUGAR FREE 64,) LIQD Place 30 mLs into feeding tube 2 (two) times daily. 03/02/18   Desiree Hane, MD  apixaban (ELIQUIS) 5 MG TABS tablet Take 1 tablet (5 mg total) by mouth 2 (two) times daily. Ok to crush with oral food ( NOT PEG) 03/02/18   Oretha Milch D, MD  diltiazem (CARDIZEM CD) 240 MG 24 hr capsule HOLD until seen by PCP 03/02/18   Desiree Hane, MD  folic acid (  FOLVITE) 1 MG tablet Take 1 tablet (1 mg total) by mouth daily. 03/02/18   Desiree Hane, MD  furosemide (LASIX) 20 MG tablet HOLD until seen by PCP 03/02/18   Desiree Hane, MD  levothyroxine (SYNTHROID, LEVOTHROID) 75 MCG tablet Take 1 tablet (75 mcg total) by mouth daily before breakfast. 03/03/18   Desiree Hane, MD  lisinopril (PRINIVIL,ZESTRIL) 40 MG tablet HOLD until seen by PCP 03/02/18   Desiree Hane, MD  Maltodextrin-Xanthan Gum (RESOURCE THICKENUP CLEAR) POWD Take 240 g by mouth as needed. 03/02/18   Oretha Milch D, MD  metoprolol succinate (TOPROL-XL) 50 MG 24 hr tablet HOLD until seen by PCP 03/02/18   Oretha Milch D, MD  metoprolol tartrate (LOPRESSOR) 50 MG tablet Take 1 tablet (50 mg total) by mouth 2 (two) times daily. 03/02/18    Oretha Milch D, MD  mirtazapine (REMERON) 15 MG tablet Take 1 tablet (15 mg total) by mouth at bedtime. 03/02/18   Desiree Hane, MD  nitroGLYCERIN (NITROSTAT) 0.4 MG SL tablet Place 1 tablet (0.4 mg total) under the tongue every 5 (five) minutes as needed for chest pain. 03/02/18   Desiree Hane, MD  Nutritional Supplements (FEEDING SUPPLEMENT, JEVITY 1.2 CAL,) LIQD Place 1,000 mLs into feeding tube continuous. 03/02/18   Desiree Hane, MD  omeprazole (PRILOSEC) 20 MG capsule Take 1 capsule (20 mg total) by mouth 2 (two) times daily before a meal. 03/02/18   Oretha Milch D, MD  simvastatin (ZOCOR) 40 MG tablet Take 0.5 tablets (20 mg total) by mouth at bedtime. 03/02/18   Desiree Hane, MD  Water For Irrigation, Sterile (FREE WATER) SOLN Place 200 mLs into feeding tube 4 (four) times daily. 03/02/18   Oretha Milch D, MD  white petrolatum (VASELINE) GEL Apply 1 application topically as needed for lip care. 03/02/18   Desiree Hane, MD    Physical Exam: Vitals:   03/19/18 1630 03/19/18 1700 03/19/18 1722 03/19/18 1819  BP: (!) 166/128 (!) 193/161 (!) 161/136 95/67  Pulse: 62 70 (!) 101 65  Resp: (!) 25 (!) 26 (!) 25 (!) 22  SpO2: 98% 95% 90% 97%  Weight:        Constitutional: afebrile, no CP and denying palpitations. He is comfortable and tolerating BIPAP.  Eyes: PERTLA, lids and conjunctivae normal, no icterus, no nystgamus ENMT: Mucous membranes are moist. Posterior pharynx clear of any exudate or lesions. No thrush .  Neck: supple, no masses, positive JVD Respiratory: positive crackles bilaterally, no rhonchi and mild tachypnea. He is not using accessory muscles, but was requiring BIPAP to maintain O2 saturation and to breath more comfortable. Cardiovascular: rate control, no rubs, no gallops, no appreciated murmur. Abdomen: No distension, no tenderness, no masses palpated. No hepatosplenomegaly. Bowel sounds normal. Patient with PEG tube in place (no discharges, no surrounding  erythema) Musculoskeletal: no cyanosis, positive bilateral ecchymosis and chronic venous stasis dermatitis. 1+ edema bilaterally. Skin: no rashes, no open wounds, no induration or petechiae.  Neurologic: CN 2-12 grossly intact. Sensation intact, Strength 3-4/5 in all 4 limbs due to poor effort and deconditioning.  Psychiatric: Normal insight. Oriented x 3. Normal mood.    Labs on Admission: I have personally reviewed following labs and imaging studies  CBC: Recent Labs  Lab 03/19/18 1516  WBC 8.2  NEUTROABS 6.6  HGB 9.5*  HCT 32.1*  MCV 92.2  PLT 932   Basic Metabolic Panel: Recent Labs  Lab 03/19/18 1516  NA 137  K 4.5  CL 98*  CO2 28  GLUCOSE 114*  BUN 22*  CREATININE 0.93  CALCIUM 9.0   Liver Function Tests: Recent Labs  Lab 03/19/18 1516  AST 27  ALT 25  ALKPHOS 70  BILITOT 0.7  PROT 6.1*  ALBUMIN 3.1*   Coagulation Profile: Recent Labs  Lab 03/19/18 1516  INR 1.39   Cardiac Enzymes: Recent Labs  Lab 03/19/18 1516  TROPONINI 0.29*   Urine analysis:    Component Value Date/Time   COLORURINE YELLOW 03/19/2018 [place] 03/19/2018 1613   LABSPEC 1.008 03/19/2018 1613   PHURINE 6.0 03/19/2018 1613   GLUCOSEU NEGATIVE 03/19/2018 1613   HGBUR LARGE (A) 03/19/2018 1613   BILIRUBINUR NEGATIVE 03/19/2018 1613   KETONESUR NEGATIVE 03/19/2018 1613   PROTEINUR NEGATIVE 03/19/2018 1613   NITRITE NEGATIVE 03/19/2018 1613   LEUKOCYTESUR SMALL (A) 03/19/2018 1613    Radiological Exams on Admission: Dg Chest Portable 1 View  Result Date: 03/19/2018 CLINICAL DATA:  Difficulty breathing for 1 hour with hypoxia EXAM: PORTABLE CHEST 1 VIEW COMPARISON:  02/22/2018 FINDINGS: Cardiac shadow is stable from the prior exam. Postsurgical changes are noted. Vascular congestion is now seen with mild parenchymal edema. No sizable effusion is noted. No bony abnormality is noted. IMPRESSION: Changes of mild CHF. Electronically Signed   By: Inez Catalina  M.D.   On: 03/19/2018 15:47    EKG: normal QT, normal axis, rate controlled and currently sinus; no acute ischemic changes appreciated.  Assessment/Plan 1-acute on chronic resp failure with hypoxia: in the setting of acute on chronic diastolic CHF and pulmonary edema. -during recent hospitalization BP was soft and patient discharge w/o any diuretics -will admit to stepdown to continue using/weaning BIPAP -start lasix 20mg  IV BID -continue metoprolol, but a lower dose  -follow daily weights and strict intake and output -cycle troponin and repeat 2-D echo -cardiology consulted  2-COPD -no wheezing on exam -will use Duoneb and pulmicort -weaned O2 supplementation home baseline as tolerated   3-Essential hypertension, benign -with recent soft BP on previous hopsitalization -continue low dose lasix and low dose metoprolol only  4-prior hx of CAD and elevated troponin -w/o CP and/or frank ischemic changes on EKG, most likely demand ischemia in the setting of diastolic HF -will cycle troponin -check 2-D echo -continue metoprolol (low dose), ASA, statins, PRN nitrates and Morphine -discussed approach with patient/family and they are in agreement  5-PAF (paroxysmal atrial fibrillation) ([place]) -CHADsVASC score 4-5 -once clear by cardiology will resume Eliquis (given elevated troponin and concerns for the need of any intervention) -continue metoprolol -currently rate is controlled and sinus  6-Thyroid cancer ([place]) -will continue outpatient follow up with ENT and IR  7-Hypothyroidism (acquired) -check TSH -continue synthroid   8-Lactic acid acidosis -most likely associated with prolonged use of albuterol, ongoing decrease perfusion with acute HF. -avoid IVF's -follow lactic acid trend     Time: 70 minutes   DVT prophylaxis: heparin, once ok with cardiology will resume Eliquis.  Code Status: DNR/DNI (ok with Bipap) Family Communication: Son at bedside  Disposition Plan: to  be determine, but hopefully back to SNF once medically stable.  Consults called: cardiology service  Admission status: inpatient, LOS > 2 midnights, stepdown bed    Barton Dubois MD [facility] Pager [phone]  If 7PM-7AM, please contact night-coverage [email or web address] Password [facility]  03/19/2018, 6:35 PM

## 2018-03-19 NOTE — ED Notes (Signed)
Date and time results received: 03/19/18 1638  Test: 0.29 Critical Value: Troponin  Name of Provider Notified: Dr. Reather Littler provider on other phone line.   Orders Received? Or Actions Taken?: No new orders given.

## 2018-03-19 NOTE — ED Notes (Signed)
AC has to bring down Vancomycin. Will be a bit before he can bring it down.

## 2018-03-19 NOTE — ED Notes (Signed)
Have checked Micromedex and Vanc and Magnesium can run together.

## 2018-03-19 NOTE — ED Triage Notes (Signed)
Pt brought in by EMS. History of COPD. Breathing difficulty started 1 hour ago. Sats 77% on Nebulizer at nursing home. Pt on non rebreather now and sats are 90%. 1 Albuterol treatment given en route.

## 2018-03-19 NOTE — ED Notes (Signed)
CRITICAL VALUE ALERT  Critical Value:  Lactic 2.15   Date & Time Notied:  4 21 19  1536  Provider Notified: Notified MD  Orders Received/Actions taken: Notified Dr Dayna Barker

## 2018-03-19 NOTE — ED Provider Notes (Signed)
Emergency Department Provider Note   I have reviewed the triage vital signs and the nursing notes.   HISTORY  Chief Complaint Shortness of Breath   HPI John Sampson is a 82 y.o. male with multiple medical problems to include coronary artery disease, CHF, COPD and recently diagnosed recurrence of his thyroid cancer that seems to be invasive in his trachea multiple surrounding structures the presents to the emergency department today with an acute onset of shortness of breath.  Patient apparently been his normal state of health and couple hours prior to arrival he became severely dyspneic.  He had wheezing so they started albuterol treatment called EMS and EMS his arrival, while getting albuterol treatment, his oxygen saturation was 77%.  They put him on a nonrebreather and brought him here for further evaluation.  On arrival here patient is alert and oriented denies any chest pain just shortness of breath.  No recent fevers or coughing.  No significant lower extremity swelling.  No other associated symptoms. No other associated or modifying symptoms.    Past Medical History:  Diagnosis Date  . Atrial flutter ([place])    on Eliquis  . AV malformation of GI tract   . COPD (chronic obstructive pulmonary disease) ([place]) 3.12.14   2D Echo, EF 50-55%  . Coronary atherosclerosis of native coronary artery    Multivessel status post CABG  . Gastric ulcer    Small - nonbleeding  . GERD (gastroesophageal reflux disease)   . Headache(784.0)   . Hypothyroidism   . Iron deficiency anemia    Negative Givens capsule study   . Myocardial infarction ([place])   . PAD (peripheral artery disease) (HCC)    Moderate bilateral SFA disease at angiography 01/2013  . Pituitary macroadenoma ([place])   . Thyroid cancer ([place])   . Vocal cord paralysis    left    Patient Active Problem List   Diagnosis Date Noted  . Acute on chronic respiratory failure with hypoxia ([place]) 03/19/2018  . Protein-calorie  malnutrition, severe 02/17/2018  . Dysphagia 02/14/2018  . Hypothyroidism (acquired) 02/14/2018  . Abrasion of lower leg, left, initial encounter 02/14/2018  . Preoperative clearance 01/11/2018  . Bilateral carotid bruits 01/11/2018  . CAD (coronary artery disease) 01/10/2018  . Iron deficiency anemia due to chronic blood loss 08/24/2017  . GI bleeding 03/31/2017  . GI bleed 03/30/2017  . Thyroid cancer ([place]) 09/14/2016  . HCAP (healthcare-associated pneumonia) 08/24/2016  . Pneumonia 08/23/2016  . Chronic diastolic congestive heart failure ([place]) 07/24/2016  . Cellulitis of right arm 07/20/2016  . Cellulitis and abscess of hand 07/20/2016  . Venous stasis ulcers ([place]) 12/24/2015  . Chronic venous insufficiency 12/24/2015  . Atherosclerosis of extremity with ulceration ([place]) 12/24/2015  . CHF (congestive heart failure) ([place]) 12/24/2015  . Critical lower limb ischemia 12/24/2015  . Obesity 12/24/2015  . PAF (paroxysmal atrial fibrillation) ([employer]) 12/24/2015  . PAD (peripheral artery disease) ([place]) 06/07/2014  . COPD (chronic obstructive pulmonary disease) ([place]) 01/23/2013  . Anemia 08/03/2012  . Essential hypertension, benign 08/03/2012  . Mixed hyperlipidemia 08/03/2012  . Hx of CABG 08/03/2012    Past Surgical History:  Procedure Laterality Date  . ABDOMINAL AORTAGRAM N/A 02/19/2013   Procedure: ABDOMINAL Maxcine Ham;  Surgeon: [name], MD;  Location: [facility] CATH LAB;  Service: Cardiovascular;  Laterality: N/A;  . CARDIAC CATHETERIZATION    . CARDIOVERSION N/A 06/16/2015   Procedure: CARDIOVERSION;  Surgeon: Satira Sark, MD;  Location: [facility] ORS;  Service: Cardiovascular;  Laterality: N/A;  . COLONOSCOPY  08/18/2012   Procedure: COLONOSCOPY;  Surgeon: [name], MD;  Location: [facility] ENDO SUITE;  Service: Endoscopy;  Laterality: N/A;  1:25  . COLONOSCOPY N/A 09/12/2017   Procedure: COLONOSCOPY;  Surgeon: [name], MD;  Location: [facility] ENDO SU[facility];  Service: Endoscopy;   Laterality: N/A;  . CORONARY ARTERY BYPASS GRAFT  2003   5 grafts - details not clear  . CRANIOTOMY  12/31/2011   Procedure: CRANIOTOMY HYPOPHYSECTOMY TRANSNASAL APPROACH;  Surgeon: [name], MD;  Location: [place] NEURO ORS;  Service: Neurosurgery;  Laterality: N/A;  Transphenoidal Hypophysectomy With Fat Graft Harvest from right abdomen   . ESOPHAGOGASTRODUODENOSCOPY  08/18/2012   Procedure: ESOPHAGOGASTRODUODENOSCOPY (EGD);  Surgeon: [name], MD;  [facility];  Service: Endoscopy;  Laterality: N/A;  . ESOPHAGOGASTRODUODENOSCOPY N/A 03/31/2017   Procedure: ESOPHAGOGASTRODUODENOSCOPY (EGD);  Surgeon: [name], MD;  [facility];  Service: Endoscopy;  Laterality: N/A;  . ESOPHAGOGASTRODUODENOSCOPY N/A 09/12/2017   Procedure: ESOPHAGOGASTRODUODENOSCOPY (EGD);  Surgeon: [name], MD;  [facility];  Service: Endoscopy;  Laterality: N/A;  10:40  . EYE SURGERY  2012  . GIVENS CAPSULE STUDY N/A 01/25/2013   Procedure: GIVENS CAPSULE STUDY;  Surgeon: [name], MD;  [facility];  Service: Endoscopy;  Laterality: N/A;  730  . HOT HEMOSTASIS  03/31/2017   Procedure: HOT HEMOSTASIS (ARGON PLASMA COAGULATION/BICAP);  Surgeon: [name], MD;  [facility];  Service: Endoscopy;;  duodenum  . IR GASTROSTOMY TUBE MOD SED  02/24/2018  . LARYNGOPLASTY Left 09/14/2016   Procedure: LARYNGOPLASTY;  Surgeon: [name], MD;  Location: [place];  Service: ENT;  Laterality: Left;  . LOWER EXTREMITY ANGIOGRAM N/A 02/19/2013   Procedure: LOWER EXTREMITY ANGIOGRAM;  Surgeon: [name], MD;  Location: [facility] CATH LAB;  Service: Cardiovascular;  Laterality: N/A;  . MICROLARYNGOSCOPY N/A 02/10/2018   Procedure: MICROLARYNGOSCOPY WITH VOCAL CORD INJECTION;  Surgeon: [name], MD;  Location: [place];  Service: ENT;  Laterality: N/A;  . PERIPHERAL VASCULAR CATHETERIZATION N/A 01/19/2016   Procedure:  Abdominal Aortogram;  Surgeon: Conrad [place], MD;  Location: [place] CV LAB;  Service: Cardiovascular;  Laterality: N/A;  . PERIPHERAL VASCULAR CATHETERIZATION Bilateral 01/19/2016   Procedure: Lower Extremity Angiography;  Surgeon: Conrad [place], MD;  Location: [place] CV LAB;  Service: Cardiovascular;  Laterality: Bilateral;  . PV Angiogram  02/19/13   Indications: slow healing left calf ulcer  . Stress Myocardial Perfusion  12/08/2011   Indications: Abnormal EKG, Eval of prior GABG  . TEE WITHOUT CARDIOVERSION N/A 06/16/2015   Procedure: TRANSESOPHAGEAL ECHOCARDIOGRAM (TEE);  Surgeon: Satira Sark, MD;  Location: [facility]S;  Service: Cardiovascular;  Laterality: N/A;  . THYROIDECTOMY N/A 09/14/2016   Procedure: TOTAL THYROIDECTOMY;  Surgeon: [name], MD;  Location: [place];  Service: ENT;  Laterality: N/A;  . TONSILLECTOMY  Age 14    Current Outpatient Rx  . Order #: [id number] Class: No Print  . Order #: [id number] Class: Normal  . Order #: [id number] Class: Normal  . Order #: [id number] Class: No Print  . Order #: [id number] Class: No Print  . Order #: [id number] Class: No Print  . Order #: [id number] Class: No Print  . Order #: [id number] Class: Normal  . Order #: [id number] Class: No Print  . Order #: [id number] Class: No Print  . Order #: [id number] Class: No Print  . Order #: [id number] Class: No Print  .  Order #: [id number] Class: No Print  . Order #: [id number] Class: No Print  . Order #: [id number] Class: No Print  . Order #: [id number] Class: No Print  . Order #: [id number] Class: No Print  . Order #: [id number] Class: No Print    Allergies Morphine and related  Family History  Problem Relation Age of Onset  . CAD Mother   . Heart disease Father        before age 40    Social History Social History   Tobacco Use  . Smoking status: Former Smoker    Packs/day: 3.00    Years: 45.00    Pack years: 135.00    Types: Cigarettes    Start date: 01/24/1955    Last attempt to quit:  01/23/2002    Years since quitting: 16.1  . Smokeless tobacco: Never Used  Substance Use Topics  . Alcohol use: No    Alcohol/week: 0.0 oz    Comment: "I quit drinking and chasing women" 30 years ago  . Drug use: No    Review of Systems  All other systems negative except as documented in the HPI. All pertinent positives and negatives as reviewed in the HPI. ____________________________________________   PHYSICAL EXAM:  VITAL SIGNS: ED Triage Vitals  Enc Vitals Group     BP 03/19/18 1541 (!) 120/48     Pulse Rate 03/19/18 1541 (!) 51     Resp 03/19/18 1541 (!) 25     SpO2 03/19/18 1526 96 %     Weight 03/19/18 1454 187 lb (84.8 kg)    Constitutional: Alert and oriented.Respiratory distress Eyes: Conjunctivae are normal. PERRL. EOMI. Head: Atraumatic. Nose: No congestion/rhinnorhea. Mouth/Throat: Mucous membranes are moist.  Oropharynx non-erythematous. Neck: No stridor.  No meningeal signs.   Cardiovascular: tachycardic rate, regular rhythm. Good peripheral circulation. Grossly normal heart sounds.   Respiratory:respiratory distress. Hypoxia. Retractions. Wheezing and crackles diffusely. Gastrointestinal: Soft and nontender. No distention.  Musculoskeletal: Mild BLE edema, No gross deformities of extremities. Neurologic:  Normal speech and language. No gross focal neurologic deficits are appreciated.  Skin:  Skin is warm, dry and intact. No rash noted.  ____________________________________________   LABS (all labs ordered are listed, but only abnormal results are displayed)  Labs Reviewed  COMPREHENSIVE METABOLIC PANEL - Abnormal; Notable for the following components:      Result Value   Chloride 98 (*)    Glucose, Bld 114 (*)    BUN 22 (*)    Total Protein 6.1 (*)    Albumin 3.1 (*)    All other components within normal limits  CBC WITH DIFFERENTIAL/PLATELET - Abnormal; Notable for the following components:   RBC 3.48 (*)    Hemoglobin 9.5 (*)    HCT 32.1 (*)     MCHC 29.6 (*)    RDW 19.2 (*)    All other components within normal limits  URINALYSIS, ROUTINE W REFLEX MICROSCOPIC - Abnormal; Notable for the following components:   Hgb urine dipstick LARGE (*)    Leukocytes, UA SMALL (*)    Bacteria, UA RARE (*)    All other components within normal limits  BRAIN NATRIURETIC PEPTIDE - Abnormal; Notable for the following components:   B Natriuretic Peptide 395.0 (*)    All other components within normal limits  TROPONIN I - Abnormal; Notable for the following components:   Troponin I 0.29 (*)    All other components within normal limits  APTT - Abnormal; Notable for the following components:   aPTT 43 (*)  All other components within normal limits  PROTIME-INR - Abnormal; Notable for the following components:   Prothrombin Time 17.0 (*)    All other components within normal limits  I-STAT CG4 LACTIC ACID, ED - Abnormal; Notable for the following components:   Lactic Acid, Venous 2.15 (*)    All other components within normal limits  I-STAT CG4 LACTIC ACID, ED - Abnormal; Notable for the following components:   Lactic Acid, Venous 4.24 (*)    All other components within normal limits  CULTURE, BLOOD (ROUTINE X 2)  CULTURE, BLOOD (ROUTINE X 2)  APTT  HEPARIN LEVEL (UNFRACTIONATED)  CBC   ____________________________________________  EKG   EKG Interpretation  Date/Time:  Sunday March 19 2018 15:01:59 EDT Ventricular Rate:  58 PR Interval:    QRS Duration: 115 QT Interval:  457 QTC Calculation: 449 R Axis:   15 Text Interpretation:  AV block, complete (third degree) LVH with IVCD and secondary repol abnrm Inferior infarct, old Probable lateral infarct, age indeterminate Baseline wander in lead(s) V2 V4 within limitations of baseline wander, no signficant change from january Confirmed by Merrily Pew [phone]) on 03/19/2018 3:07:30 PM      ____________________________________________  RADIOLOGY  Dg Chest Portable 1  View  Result Date: 03/19/2018 CLINICAL DATA:  Difficulty breathing for 1 hour with hypoxia EXAM: PORTABLE CHEST 1 VIEW COMPARISON:  02/22/2018 FINDINGS: Cardiac shadow is stable from the prior exam. Postsurgical changes are noted. Vascular congestion is now seen with mild parenchymal edema. No sizable effusion is noted. No bony abnormality is noted. IMPRESSION: Changes of mild CHF. Electronically Signed   By: Inez Catalina M.D.   On: 03/19/2018 15:47    ____________________________________________   PROCEDURES  Procedure(s) performed:   Procedures  CRITICAL CARE Performed by: Merrily Pew Total critical care time: 45 minutes Critical care time was exclusive of separately billable procedures and treating other patients. Critical care was necessary to treat or prevent imminent or life-threatening deterioration. Critical care was time spent personally by me on the following activities: development of treatment plan with patient and/or surrogate as well as nursing, discussions with consultants, evaluation of patient's response to treatment, examination of patient, obtaining history from patient or surrogate, ordering and performing treatments and interventions, ordering and review of laboratory studies, ordering and review of radiographic studies, pulse oximetry and re-evaluation of patient's condition.  ____________________________________________   INITIAL IMPRESSION / ASSESSMENT AND PLAN / ED COURSE  82 year old male with multiple medical problems here with respiratory failure and hypoxia.  Unclear etiology initially seen more consistent with COPD secondary to the wheezing, is a history of the same so started on antibiotics, fluids, magnesium and steroids with BiPAP.  His respiratory distress improved dramatically however his labs started resulting in x-ray were more consistent with likely CHF exacerbation aside from a slightly elevated lactic acid.  Fluids were stopped the antibiotics  continued.  Troponin came back at 0.29 I think this is likely related to congestive heart failure exacerbation rather than a primary ACS event but will start on heparin while waiting for that to trend.  Aspirin administered at this time as well.  Also can consider pulmonary embolus at since he does have a history of cancer with a slightly elevated BNP, troponin and the acute onset.  However patient is having low blood pressure tachycardia regular but less likely in his x-ray is more consistent with a fluid overload rather than a pulmonary infarct.  Either way the heparin should treat that while waiting to  see what his echocardiogram shows as the patient.  Discussed the case with hospitalist who will admit for further management.   Pertinent labs & imaging results that were available during my care of the patient were reviewed by me and considered in my medical decision making (see chart for details).  ____________________________________________  FINAL CLINICAL IMPRESSION(S) / ED DIAGNOSES  Final diagnoses:  Hypoxia  SOB (shortness of breath)  Respiratory distress  Acute respiratory failure with hypoxia (HCC)     MEDICATIONS GIVEN DURING THIS VISIT:  Medications  vancomycin (VANCOCIN) 1,500 mg in sodium chloride 0.9 % 500 mL IVPB (1,500 mg Intravenous New Bag/Given 03/19/18 1625)  albuterol (PROVENTIL,VENTOLIN) solution continuous neb (15 mg/hr Nebulization New Bag/Given 03/19/18 1526)  nitroGLYCERIN 50 mg in dextrose 5 % 250 mL (0.2 mg/mL) infusion (has no administration in time range)  heparin ADULT infusion 100 units/mL (25000 units/225mL sodium chloride 0.45%) (has no administration in time range)  vancomycin (VANCOCIN) IVPB 1000 mg/200 mL premix (has no administration in time range)  piperacillin-tazobactam (ZOSYN) IVPB 3.375 g (has no administration in time range)  piperacillin-tazobactam (ZOSYN) IVPB 3.375 g (0 g Intravenous Stopped 03/19/18 1605)  ipratropium (ATROVENT) nebulizer  solution 1 mg (1 mg Nebulization Given 03/19/18 1526)  methylPREDNISolone sodium succinate (SOLU-MEDROL) 125 mg/2 mL injection 125 mg (125 mg Intravenous Given 03/19/18 1524)  magnesium sulfate IVPB 2 g 50 mL (0 g Intravenous Stopped 03/19/18 1654)  aspirin chewable tablet 324 mg (324 mg Oral Given 03/19/18 1652)  furosemide (LASIX) injection 80 mg (80 mg Intravenous Given 03/19/18 1651)     NEW OUTPATIENT MEDICATIONS STARTED DURING THIS VISIT:  New Prescriptions   No medications on file    Note:  This note was prepared with assistance of Dragon voice recognition software. Occasional wrong-word or sound-a-like substitutions may have occurred due to the inherent limitations of voice recognition software.   Merrily Pew, MD 03/19/18 [phone]

## 2018-03-19 NOTE — ED Notes (Signed)
Per Dyann Kief, NO Nitro and NO Heparin

## 2018-03-19 NOTE — Progress Notes (Signed)
Pharmacy Note:  Initial antibiotic(s) regimen of Vancomycin and Zosyn ordered by EDP to treat sepsis.  Estimated Creatinine Clearance: 63.4 mL/min (by C-G formula based on SCr of 0.93 mg/dL).   Allergies  Allergen Reactions  . Morphine And Related Nausea And Vomiting    Vitals:   03/19/18 1630 03/19/18 1700  BP: (!) 166/128 (!) 193/161  Pulse: 62 70  Resp: (!) 25 (!) 26  SpO2: 98% 95%    Anti-infectives (From admission, onward)   Start     Dose/Rate Route Frequency Ordered Stop   03/20/18 0600  vancomycin (VANCOCIN) IVPB 1000 mg/200 mL premix     1,000 mg 200 mL/hr over 60 Minutes Intravenous Every 12 hours 03/19/18 1724     03/19/18 2330  piperacillin-tazobactam (ZOSYN) IVPB 3.375 g     3.375 g 12.5 mL/hr over 240 Minutes Intravenous Every 8 hours 03/19/18 1725     03/19/18 1615  vancomycin (VANCOCIN) 1,500 mg in sodium chloride 0.9 % 500 mL IVPB     1,500 mg 250 mL/hr over 120 Minutes Intravenous  Once 03/19/18 1501     03/19/18 1515  piperacillin-tazobactam (ZOSYN) IVPB 3.375 g     3.375 g 100 mL/hr over 30 Minutes Intravenous  Once 03/19/18 1501 03/19/18 1605     Plan: Initial dose(s) of Vancomycin 1500mg  and Zosyn 3.375gm X 1 ordered. F/U admission orders for further dosing if therapy continued.  Ena Dawley, [facility] 03/19/2018 5:25 PM

## 2018-03-19 NOTE — ED Notes (Signed)
3rd RN attempting 2nd IV

## 2018-03-20 ENCOUNTER — Inpatient Hospital Stay (HOSPITAL_COMMUNITY): Payer: Medicare Other

## 2018-03-20 DIAGNOSIS — I340 Nonrheumatic mitral (valve) insufficiency: Secondary | ICD-10-CM

## 2018-03-20 LAB — BASIC METABOLIC PANEL
Anion gap: 12 (ref 5–15)
BUN: 26 mg/dL — AB (ref 6–20)
CALCIUM: 8.4 mg/dL — AB (ref 8.9–10.3)
CO2: 27 mmol/L (ref 22–32)
CREATININE: 1.16 mg/dL (ref 0.61–1.24)
Chloride: 97 mmol/L — ABNORMAL LOW (ref 101–111)
GFR calc Af Amer: >60 mL/min (ref >60)
GFR calc non Af Amer: 58 mL/min — ABNORMAL LOW (ref >60)
Glucose, Bld: 248 mg/dL — ABNORMAL HIGH (ref 65–99)
Potassium: 4.2 mmol/L (ref 3.5–5.1)
SODIUM: 136 mmol/L (ref 135–145)

## 2018-03-20 LAB — LACTIC ACID, PLASMA
LACTIC ACID, VENOUS: 2.9 mmol/L — AB (ref 0.5–1.9)
Lactic Acid, Venous: 1.7 mmol/L (ref 0.5–1.9)

## 2018-03-20 LAB — TROPONIN I
TROPONIN I: 0.19 ng/mL — AB (ref <0.03)
Troponin I: 0.19 ng/mL (ref <0.03)

## 2018-03-20 LAB — GLUCOSE, CAPILLARY
GLUCOSE-CAPILLARY: 124 mg/dL — AB (ref 65–99)
GLUCOSE-CAPILLARY: 123 mg/dL — AB (ref 65–99)

## 2018-03-20 LAB — ECHOCARDIOGRAM COMPLETE
HEIGHTINCHES: 69 in
Weight: 3044.11 oz

## 2018-03-20 LAB — MRSA PCR SCREENING: MRSA by PCR: NEGATIVE

## 2018-03-20 LAB — MAGNESIUM: MAGNESIUM: 2.2 mg/dL (ref 1.7–2.4)

## 2018-03-20 MED ORDER — CHLORHEXIDINE GLUCONATE 0.12 % MT SOLN
15.0000 mL | Freq: Two times a day (BID) | OROMUCOSAL | Status: DC
  Administered 2018-03-20 – 2018-03-28 (×17): 15 mL via OROMUCOSAL
  Filled 2018-03-20 (×15): qty 15

## 2018-03-20 MED ORDER — INSULIN ASPART 100 UNIT/ML ~~LOC~~ SOLN
0.0000 [IU] | Freq: Three times a day (TID) | SUBCUTANEOUS | Status: DC
  Administered 2018-03-20 – 2018-03-27 (×9): 1 [IU] via SUBCUTANEOUS
  Administered 2018-03-28: 2 [IU] via SUBCUTANEOUS

## 2018-03-20 MED ORDER — ORAL CARE MOUTH RINSE
15.0000 mL | Freq: Two times a day (BID) | OROMUCOSAL | Status: DC
  Administered 2018-03-20 – 2018-03-27 (×10): 15 mL via OROMUCOSAL

## 2018-03-20 NOTE — Progress Notes (Signed)
Nutrition Follow-up    INTERVENTION:   -Continue Jevity 1.2 @ 64ml/hr via PEG  Continue 30 ml Prostat BID.    Continue 200 ml free water flush QID  Tube feeding regimen provides 2072 kcal (100% of needs), 117 grams of protein, and 1264 ml of H2O. (Total free water: 2064 ml)  NUTRITION DIAGNOSIS:   Swallowing difficulty related to inability to eat, dysphagia, cancer and cancer related treatments(Per ST asssessments including MBS- pt was on D1 NECTAR last hospitalization- but not eating but 0-10%) as evidenced by NPO status(ST evaluations).  GOAL:  Patient will meet greater than or equal to 90% of their needs  MONITOR:  TF tolerance, Weight trends, Labs, Skin  REASON FOR ASSESSMENT:   Malnutrition Screening Tool    ASSESSMENT:  Patient I is  An 82 yo male with swallow difficulty. 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 PEG placed on 3/29.    Patient is NPO and receiving Jevity 1.2@65ml /hr viaPEG with 13ml Prostat BID and 200 ml free water flush QID. Regimen provides 2072 kcals, 117 grams protein and 2059 ml free water daily.  This is meeting 100% of his estimated needs.    Patient weight hx shows he is 5% below usual range of 200-210 lb. His weight is up 2 lb since discharge from [facility].  Patient was diagnosed with severe malnutrition (acute) during last months hospitalization but since PEG was placed he has been meeting daily needs and nutrition status is improved.   RD note 3/30 [facility]: 3/24- s/p MBSS- advanced to dysphagia 1 diet with honey thick liquids 3/27- s/p Repeat MBS- diet advanced to D1/NTL 3/28 CT shows extensive recurrence of thyroid cancer adjacent to trachea as well as into anterior esophageal wall and probable lung mets.   Labs: BMP Latest Ref Rng & Units 03/20/2018 03/19/2018 02/27/2018  Glucose 65 - 99 mg/dL 248(H) 114(H) 123(H)  BUN 6 - 20 mg/dL 26(H) 22(H) 23(H)  Creatinine 0.61 - 1.24 mg/dL 1.16 0.93 0.83  Sodium 135  - 145 mmol/L 136 137 134(L)  Potassium 3.5 - 5.1 mmol/L 4.2 4.5 3.8  Chloride 101 - 111 mmol/L 97(L) 98(L) 100(L)  CO2 22 - 32 mmol/L 27 28 28   Calcium 8.9 - 10.3 mg/dL 8.4(L) 9.0 8.8(L)    Meds: . aspirin EC  81 mg Oral Daily  . budesonide (PULMICORT) nebulizer solution  0.5 mg Nebulization BID  . chlorhexidine  15 mL Mouth Rinse BID  . feeding supplement (PRO-STAT SUGAR FREE 64)  30 mL Per Tube BID  . folic acid  1 mg Per Tube Daily  . free water  200 mL Per Tube QID  . furosemide  20 mg Intravenous Q12H  . heparin  5,000 Units Subcutaneous Q8H  . levothyroxine  75 mcg Per Tube QAC breakfast  . mouth rinse  15 mL Mouth Rinse q12n4p  . metoprolol tartrate  12.5 mg Per Tube BID  . mirtazapine  15 mg Per Tube QHS  . pantoprazole  40 mg Oral Daily  . simvastatin  20 mg Per Tube QHS  . sodium chloride flush  3 mL Intravenous Q12H   Past Medical History:  Diagnosis Date  . Atrial flutter ([place])    on Eliquis  . AV malformation of GI tract   . COPD (chronic obstructive pulmonary disease) ([place]) 3.12.14   2D Echo, EF 50-55%  . Coronary atherosclerosis of native coronary artery    Multivessel status post CABG  . Gastric ulcer  Small - nonbleeding  . GERD (gastroesophageal reflux disease)   . Headache(784.0)   . Hypothyroidism   . Iron deficiency anemia    Negative Givens capsule study   . Myocardial infarction ([place])   . PAD (peripheral artery disease) (HCC)    Moderate bilateral SFA disease at angiography 01/2013  . Pituitary macroadenoma ([place])   . Thyroid cancer ([place])   . Vocal cord paralysis    left     Diet Order:  Diet NPO time specified  EDUCATION NEEDS:    Skin:  Skin Assessment: Skin Integrity Issues: Incisions: (wound to sacrum- unknown staging)  Last BM:  4/20 type 4  Height:   Ht Readings from Last 1 Encounters:  03/19/18 5\' 9"  (1.753 m)    Weight:   Wt Readings from Last 1 Encounters:  03/20/18 190 lb 4.1 oz (86.3 kg)  02/25/18 pt wt 188.4 lb    Ideal Body Weight:   73 kg  BMI:  Body mass index is 28.1 kg/m.  Estimated Nutritional Needs:   Kcal:   1950-2150 (23-25 kcal/kg)  Protein:   102-120 gr (1.2-1.4 g/kg/bw)  Fluid:   2.0-2.2 liters daily (73ml/kcal)   Colman Cater MS,RD,CSG,LDN Office: [phone] Pager: [phone]

## 2018-03-20 NOTE — Progress Notes (Signed)
Patient admitted yesterday with history of diastolic CHF, status post CABG, hypertension hyperlipidemia with mention of soft blood pressures on last admission currently on BiPAP for hypoxic respiratory insufficiency he was placed on Lasix 20 IV twice daily on admission likewise on vancomycin and Zosyn he had elevated lactic acids.  No focus of infection identified at present.  2D echo being performed at present shows mildly reduced systolic function if not lower limits of normal mildly enlarged left atrium no other abnormalities noted. TEE John Sampson:[id number] DOB: 11/14/1936 DOA: 03/19/2018 PCP: John Du, MD   Physical Exam: Blood pressure (!) 101/47, pulse 66, temperature 99 F (37.2 C), temperature source Axillary, resp. rate 16, height 5\' 9"  (1.753 Sampson), weight 86.3 kg (190 lb 4.1 oz), SpO2 96 %.  Lungs show minimal rales in right lower lobe prolonged expiratory phase scattered rhonchi no wheeze audible heart regular rhythm no S3-S4 no heaves thrills or rubs.  Abdomen soft nontender bowel sounds normoactive   Investigations:  Recent Results (from the past 240 hour(s))  Blood Culture (routine x 2)     Status: None (Preliminary result)   Collection Time: 03/19/18  3:06 PM  Result Value Ref Range Status   Specimen Description BLOOD RIGHT FOREARM  Final   Special Requests   Final    ANAEROBIC BOTTLE ONLY Blood Culture results may not be optimal due to an inadequate volume of blood received in culture bottles   Culture   Final    NO GROWTH < 24 HOURS Performed at [facility], [address], [place], [place] [address]    Report Status PENDING  Incomplete  Blood Culture (routine x 2)     Status: None (Preliminary result)   Collection Time: 03/19/18  3:15 PM  Result Value Ref Range Status   Specimen Description BLOOD RIGHT HAND  Final   Special Requests   Final    BOTTLES DRAWN AEROBIC ONLY Blood Culture results may not be optimal due to an inadequate volume of blood received in  culture bottles   Culture   Final    NO GROWTH < 24 HOURS Performed at [facility], [address], [place], [place] [address]    Report Status PENDING  Incomplete  MRSA PCR Screening     Status: None   Collection Time: 03/19/18  8:08 PM  Result Value Ref Range Status   MRSA by PCR NEGATIVE NEGATIVE Final    Comment:        The GeneXpert MRSA Assay (FDA approved for NASAL specimens only), is one component of a comprehensive MRSA colonization surveillance program. It is not intended to diagnose MRSA infection nor to guide or monitor treatment for MRSA infections. Performed at [facility], [address], [place], [place] [address]      Basic Metabolic Panel: Recent Labs    03/19/18 1516 03/19/18 2058 03/20/18 0344  NA 137  --  136  K 4.5  --  4.2  CL 98*  --  97*  CO2 28  --  27  GLUCOSE 114*  --  248*  BUN 22*  --  26*  CREATININE 0.93  --  1.16  CALCIUM 9.0  --  8.4*  MG  --  2.3  --    Liver Function Tests: Recent Labs    03/19/18 1516  AST 27  ALT 25  ALKPHOS 70  BILITOT 0.7  PROT 6.1*  ALBUMIN 3.1*     CBC: Recent Labs    03/19/18 1516  WBC  8.2  NEUTROABS 6.6  HGB 9.5*  HCT 32.1*  MCV 92.2  PLT 329    Dg Chest Portable 1 View  Result Date: 03/19/2018 CLINICAL DATA:  Difficulty breathing for 1 hour with hypoxia EXAM: PORTABLE CHEST 1 VIEW COMPARISON:  02/22/2018 FINDINGS: Cardiac shadow is stable from the prior exam. Postsurgical changes are noted. Vascular congestion is now seen with mild parenchymal edema. No sizable effusion is noted. No bony abnormality is noted. IMPRESSION: Changes of mild CHF. Electronically Signed   By: John Sampson Sampson.D.   On: 03/19/2018 15:47      Medications:   Impression:  Active Problems:   Essential hypertension, benign   COPD (chronic obstructive pulmonary disease) (HCC)   PAF (paroxysmal atrial fibrillation) (HCC)   Acute on chronic diastolic CHF (congestive heart failure) (HCC)   Thyroid cancer (HCC)    CAD (coronary artery disease)   Hypothyroidism (acquired)   Acute on chronic respiratory failure with hypoxia (HCC)   Elevated troponin   Lactic acid acidosis     Plan: Continue vancomycin and Zosyn empirically.  Continue Lasix 20 IV twice daily for diastolic CHF.  P lactic acid this morning.  And daily obtain serum magnesium level today.  Cardiology consult requested for today  Consultants: Cardiology requested   Procedures   Antibiotics: Vancomycin and Zosyn          Time spent: 30 minutes   LOS: 1 day   John Sampson   03/20/2018, 8:11 AM

## 2018-03-20 NOTE — Progress Notes (Signed)
Lactic Acid critical lab result of 2.9. Dr. Manuella Ghazi paged and made aware.

## 2018-03-20 NOTE — Progress Notes (Signed)
MD ordered for pt to be on sensitive correction tid as DM coordinator suggested  Magan Winnett Rica Mote, RN

## 2018-03-20 NOTE — Progress Notes (Signed)
Inpatient Diabetes Program Recommendations  AACE/ADA: New Consensus Statement on Inpatient Glycemic Control (2015)  Target Ranges:  Prepandial:   less than 140 mg/dL      Peak postprandial:   less than 180 mg/dL (1-2 hours)      Critically ill patients:  140 - 180 mg/dL   Lab Results  Component Value Date   GLUCAP 112 (H) 03/02/2018   HGBA1C 5.8 (H) 08/14/2013    Review of Glycemic Control  Inpatient Diabetes Program Recommendations:   Noted random lab glucose 248 this am. Consider Glycemic control order set sensitive correction tid  Thank you, Nani Gasser. Mihira Tozzi, RN, MSN, CDE  Diabetes Coordinator Inpatient Glycemic Control Team Team Pager [phone] (8am-5pm) 03/20/2018 1:27 PM

## 2018-03-20 NOTE — Progress Notes (Addendum)
  Echocardiogram 2D Echocardiogram has been performed.  Poor patient compliance and mobility.   John Sampson L Androw 03/20/2018, 8:36 AM

## 2018-03-20 NOTE — Care Management Note (Addendum)
Case Management Note  Patient Details  Name: John Sampson MRN: [id number] Date of Birth: 02-20-1936  Subjective/Objective: Adm with respiratory failure in the setting of CHF and pulmonary edema requiring Bipap. CM consulted for Heart failure core measures. Patient is currently receiving rehab at [facility] prior to following up on thyroid cancer as an outpatient.              Action/Plan: Anticipate DC back to [facility]. CM to follow.    Expected Discharge Date:      unk            Expected Discharge Plan:     In-House Referral:  Clinical Social Work  Discharge planning Services  CM Consult  Post Acute Care Choice:    Choice offered to:     DME Arranged:    DME Agency:     HH Arranged:    HH Agency:     Status of Service:  In process, will continue to follow  If discussed at Long Length of Stay Meetings, dates discussed:    Additional Comments:  John Sampson, Chauncey Reading, RN 03/20/2018, 1:42 PM

## 2018-03-21 ENCOUNTER — Other Ambulatory Visit: Payer: Self-pay

## 2018-03-21 DIAGNOSIS — I5033 Acute on chronic diastolic (congestive) heart failure: Secondary | ICD-10-CM

## 2018-03-21 LAB — CBC WITH DIFFERENTIAL/PLATELET
BASOS PCT: 0 %
Basophils Absolute: 0 10*3/uL (ref 0.0–0.1)
EOS ABS: 0.2 10*3/uL (ref 0.0–0.7)
EOS PCT: 1 %
HCT: 27.7 % — ABNORMAL LOW (ref 39.0–52.0)
Hemoglobin: 8.4 g/dL — ABNORMAL LOW (ref 13.0–17.0)
LYMPHS ABS: 1 10*3/uL (ref 0.7–4.0)
Lymphocytes Relative: 8 %
MCH: 27.6 pg (ref 26.0–34.0)
MCHC: 30.3 g/dL (ref 30.0–36.0)
MCV: 91.1 fL (ref 78.0–100.0)
MONOS PCT: 9 %
Monocytes Absolute: 1.2 10*3/uL — ABNORMAL HIGH (ref 0.1–1.0)
Neutro Abs: 10.2 10*3/uL — ABNORMAL HIGH (ref 1.7–7.7)
Neutrophils Relative %: 82 %
PLATELETS: 339 10*3/uL (ref 150–400)
RBC: 3.04 MIL/uL — AB (ref 4.22–5.81)
RDW: 19 % — ABNORMAL HIGH (ref 11.5–15.5)
WBC: 12.6 10*3/uL — AB (ref 4.0–10.5)

## 2018-03-21 LAB — BASIC METABOLIC PANEL
Anion gap: 14 (ref 5–15)
BUN: 38 mg/dL — AB (ref 6–20)
CALCIUM: 9 mg/dL (ref 8.9–10.3)
CHLORIDE: 95 mmol/L — AB (ref 101–111)
CO2: 28 mmol/L (ref 22–32)
CREATININE: 0.89 mg/dL (ref 0.61–1.24)
GFR calc Af Amer: >60 mL/min (ref >60)
GFR calc non Af Amer: >60 mL/min (ref >60)
Glucose, Bld: 108 mg/dL — ABNORMAL HIGH (ref 65–99)
Potassium: 3.7 mmol/L (ref 3.5–5.1)
Sodium: 137 mmol/L (ref 135–145)

## 2018-03-21 LAB — GLUCOSE, CAPILLARY
GLUCOSE-CAPILLARY: 125 mg/dL — AB (ref 65–99)
Glucose-Capillary: 117 mg/dL — ABNORMAL HIGH (ref 65–99)
Glucose-Capillary: 118 mg/dL — ABNORMAL HIGH (ref 65–99)
Glucose-Capillary: 103 mg/dL — ABNORMAL HIGH (ref 65–99)

## 2018-03-21 MED ORDER — MORPHINE SULFATE (PF) 2 MG/ML IV SOLN
2.0000 mg | INTRAVENOUS | Status: DC | PRN
  Administered 2018-03-22 – 2018-03-28 (×17): 2 mg via INTRAVENOUS
  Filled 2018-03-21 (×18): qty 1

## 2018-03-21 MED ORDER — FUROSEMIDE 10 MG/ML IJ SOLN
40.0000 mg | Freq: Two times a day (BID) | INTRAMUSCULAR | Status: DC
  Administered 2018-03-21 – 2018-03-23 (×5): 40 mg via INTRAVENOUS
  Filled 2018-03-21 (×5): qty 4

## 2018-03-21 MED ORDER — APIXABAN 5 MG PO TABS
5.0000 mg | ORAL_TABLET | Freq: Two times a day (BID) | ORAL | Status: DC
  Administered 2018-03-21 – 2018-03-23 (×6): 5 mg via ORAL
  Filled 2018-03-21 (×6): qty 1

## 2018-03-21 MED ORDER — MORPHINE SULFATE (PF) 2 MG/ML IV SOLN
1.0000 mg | INTRAVENOUS | Status: AC
  Administered 2018-03-21: 1 mg via INTRAVENOUS
  Filled 2018-03-21: qty 1

## 2018-03-21 MED ORDER — IPRATROPIUM-ALBUTEROL 0.5-2.5 (3) MG/3ML IN SOLN
3.0000 mL | Freq: Three times a day (TID) | RESPIRATORY_TRACT | Status: DC
  Administered 2018-03-22 – 2018-03-27 (×16): 3 mL via RESPIRATORY_TRACT
  Filled 2018-03-21 (×16): qty 3

## 2018-03-21 NOTE — Progress Notes (Signed)
Patient refusing BIPAP for the night unless he needs it. Patient doing well at this time on 2 lpm nasal cannula. Breathing treatment given.

## 2018-03-21 NOTE — Progress Notes (Signed)
Subjective: He was admitted on the 21st with acute respiratory failure with acute on chronic heart failure.  He says he feels better.  He has no new complaints.  He has multiple other medical problems including COPD, chronic hypoxic respiratory failure diabetes thyroid cancer.  Objective: Vital signs in last 24 hours: Temp:  [97.5 F (36.4 C)-98.7 F (37.1 C)] 97.7 F (36.5 C) (04/23 0746) Pulse Rate:  [55-69] 61 (04/23 0746) Resp:  [13-24] 15 (04/23 0746) BP: (107-162)/(45-77) 137/51 (04/23 0645) SpO2:  [91 %-100 %] 97 % (04/23 0746) FiO2 (%):  [40 %] 40 % (04/22 2029) Weight:  [86.5 kg (190 lb 11.2 oz)] 86.5 kg (190 lb 11.2 oz) (04/23 0500) Weight change: 1.677 kg (3 lb 11.2 oz) Last BM Date: 03/18/18  Intake/Output from previous day: 04/22 0701 - 04/23 0700 In: 1510 [NG/GT:1510] Out: 2400 [Urine:2400]  PHYSICAL EXAM General appearance: alert, cooperative and On BiPAP Resp: rhonchi bilaterally Cardio: regular rate and rhythm, S1, S2 normal, no murmur, click, rub or gallop GI: soft, non-tender; bowel sounds normal; no masses,  no organomegaly Extremities: extremities normal, atraumatic, no cyanosis or edema He has bruising of the left lower extremity  Lab Results:  Results for orders placed or performed during the hospital encounter of 03/19/18 (from the past 48 hour(s))  Blood Culture (routine x 2)     Status: None (Preliminary result)   Collection Time: 03/19/18  3:06 PM  Result Value Ref Range   Specimen Description BLOOD RIGHT FOREARM    Special Requests      ANAEROBIC BOTTLE ONLY Blood Culture results may not be optimal due to an inadequate volume of blood received in culture bottles   Culture      NO GROWTH < 24 HOURS Performed at [facility], [address] [place], [place] [address]    Report Status PENDING   Blood Culture (routine x 2)     Status: None (Preliminary result)   Collection Time: 03/19/18  3:15 PM  Result Value Ref Range   Specimen Description  BLOOD RIGHT HAND    Special Requests      BOTTLES DRAWN AEROBIC ONLY Blood Culture results may not be optimal due to an inadequate volume of blood received in culture bottles   Culture      NO GROWTH < 24 HOURS Performed at [facility], [address], [place], [place] [address]    Report Status PENDING   Comprehensive metabolic panel     Status: Abnormal   Collection Time: 03/19/18  3:16 PM  Result Value Ref Range   Sodium 137 135 - 145 mmol/Sampson   Potassium 4.5 3.5 - 5.1 mmol/Sampson   Chloride 98 (Sampson) 101 - 111 mmol/Sampson   CO2 28 22 - 32 mmol/Sampson   Glucose, Bld 114 (H) 65 - 99 mg/dL   BUN 22 (H) 6 - 20 mg/dL   Creatinine, Ser 0.93 0.61 - 1.24 mg/dL   Calcium 9.0 8.9 - 10.3 mg/dL   Total Protein 6.1 (Sampson) 6.5 - 8.1 g/dL   Albumin 3.1 (Sampson) 3.5 - 5.0 g/dL   AST 27 15 - 41 U/Sampson   ALT 25 17 - 63 U/Sampson   Alkaline Phosphatase 70 38 - 126 U/Sampson   Total Bilirubin 0.7 0.3 - 1.2 mg/dL   GFR calc non Af Amer >60 >60 mL/min   GFR calc Af Amer >60 >60 mL/min    Comment: (NOTE) The eGFR has been calculated using the CKD EPI equation. This calculation has not  been validated in all clinical situations. eGFR's persistently <60 mL/min signify possible Chronic Kidney Disease.    Anion gap 11 5 - 15    Comment: Performed at [facility], [address] [place], [place] [address]  CBC WITH DIFFERENTIAL     Status: Abnormal   Collection Time: 03/19/18  3:16 PM  Result Value Ref Range   WBC 8.2 4.0 - 10.5 K/uL   RBC 3.48 (Sampson) 4.22 - 5.81 MIL/uL   Hemoglobin 9.5 (Sampson) 13.0 - 17.0 g/dL   HCT 32.1 (Sampson) 39.0 - 52.0 %   MCV 92.2 78.0 - 100.0 fL   MCH 27.3 26.0 - 34.0 pg   MCHC 29.6 (Sampson) 30.0 - 36.0 g/dL   RDW 19.2 (H) 11.5 - 15.5 %   Platelets 329 150 - 400 K/uL   Neutrophils Relative % 81 %   Neutro Abs 6.6 1.7 - 7.7 K/uL   Lymphocytes Relative 10 %   Lymphs Abs 0.8 0.7 - 4.0 K/uL   Monocytes Relative 7 %   Monocytes Absolute 0.6 0.1 - 1.0 K/uL   Eosinophils Relative 2 %   Eosinophils Absolute 0.2 0.0 - 0.7 K/uL    Basophils Relative 0 %   Basophils Absolute 0.0 0.0 - 0.1 K/uL    Comment: Performed at [facility], [address] [place], [place] [address]  Brain natriuretic peptide     Status: Abnormal   Collection Time: 03/19/18  3:16 PM  Result Value Ref Range   B Natriuretic Peptide 395.0 (H) 0.0 - 100.0 pg/mL    Comment: Performed at [facility], [address], [place], [place] [address]  Troponin I     Status: Abnormal   Collection Time: 03/19/18  3:16 PM  Result Value Ref Range   Troponin I 0.29 (HH) <0.03 ng/mL    Comment: CRITICAL RESULT CALLED TO, READ BACK BY AND VERIFIED WITH: KENDRICK,J'@1638'  BY MATTHEWS, B 4.21.19 Performed at [facility], [address], [place], [place] [address]   APTT     Status: Abnormal   Collection Time: 03/19/18  3:16 PM  Result Value Ref Range   aPTT 43 (H) 24 - 36 seconds    Comment:        IF BASELINE aPTT IS ELEVATED, SUGGEST PATIENT RISK ASSESSMENT BE USED TO DETERMINE APPROPRIATE ANTICOAGULANT THERAPY. Performed at [facility], [address], [place], [place] [address]   Protime-INR     Status: Abnormal   Collection Time: 03/19/18  3:16 PM  Result Value Ref Range   Prothrombin Time 17.0 (H) 11.4 - 15.2 seconds   INR 1.39     Comment: Performed at [facility], [address], [place], [place] [address]  I-Stat CG4 Lactic Acid, ED  (not at  [facility])     Status: Abnormal   Collection Time: 03/19/18  3:24 PM  Result Value Ref Range   Lactic Acid, Venous 2.15 (HH) 0.5 - 1.9 mmol/Sampson   Comment NOTIFIED PHYSICIAN   Urinalysis, Routine w reflex microscopic     Status: Abnormal   Collection Time: 03/19/18  4:13 PM  Result Value Ref Range   Color, Urine YELLOW YELLOW   APPearance CLEAR CLEAR   Specific Gravity, Urine 1.008 1.005 - 1.030   pH 6.0 5.0 - 8.0   Glucose, UA NEGATIVE NEGATIVE mg/dL   Hgb urine dipstick LARGE (A) NEGATIVE   Bilirubin Urine NEGATIVE NEGATIVE   Ketones, ur NEGATIVE NEGATIVE mg/dL   Protein, ur NEGATIVE NEGATIVE mg/dL    Nitrite NEGATIVE  NEGATIVE   Leukocytes, UA SMALL (A) NEGATIVE   RBC / HPF 0-5 0 - 5 RBC/hpf   WBC, UA 6-30 0 - 5 WBC/hpf   Bacteria, UA RARE (A) NONE SEEN   Squamous Epithelial / LPF NONE SEEN NONE SEEN    Comment: Performed at [facility], [address] [place], [place] [address]  I-Stat CG4 Lactic Acid, ED  (not at  [facility])     Status: Abnormal   Collection Time: 03/19/18  5:27 PM  Result Value Ref Range   Lactic Acid, Venous 4.24 (HH) 0.5 - 1.9 mmol/Sampson  Blood gas, arterial     Status: Abnormal   Collection Time: 03/19/18  6:34 PM  Result Value Ref Range   FIO2 55.00    Delivery systems BILEVEL POSITIVE AIRWAY PRESSURE    LHR 16 resp/min   Inspiratory PAP 16    Expiratory PAP 8    pH, Arterial 7.441 7.350 - 7.450   pCO2 arterial 42.5 32.0 - 48.0 mmHg   pO2, Arterial 95.9 83.0 - 108.0 mmHg   Bicarbonate 28.3 (H) 20.0 - 28.0 mmol/Sampson   Acid-Base Excess 4.5 (H) 0.0 - 2.0 mmol/Sampson   O2 Saturation 97.6 %   Collection site RIGHT RADIAL    Drawn by [id number]    Sample type ARTERIAL    Allens test (pass/fail) PASS PASS    Comment: Performed at [facility], [address], [place], [place] [address]  MRSA PCR Screening     Status: None   Collection Time: 03/19/18  8:08 PM  Result Value Ref Range   MRSA by PCR NEGATIVE NEGATIVE    Comment:        The GeneXpert MRSA Assay (FDA approved for NASAL specimens only), is one component of a comprehensive MRSA colonization surveillance program. It is not intended to diagnose MRSA infection nor to guide or monitor treatment for MRSA infections. Performed at [facility], [address] [place], [place] [address]   Magnesium     Status: None   Collection Time: 03/19/18  8:58 PM  Result Value Ref Range   Magnesium 2.3 1.7 - 2.4 mg/dL    Comment: Performed at [facility], [address], [place], [place] [address]  Troponin I     Status: Abnormal   Collection Time: 03/19/18  8:58 PM  Result Value Ref Range   Troponin I 0.24 (HH) <0.03 ng/mL     Comment: CRITICAL VALUE NOTED.  VALUE IS CONSISTENT WITH PREVIOUSLY REPORTED AND CALLED VALUE. Performed at [facility], [address], [place], [place] [address]   TSH     Status: Abnormal   Collection Time: 03/19/18  8:58 PM  Result Value Ref Range   TSH 6.260 (H) 0.350 - 4.500 uIU/mL    Comment: Performed by a 3rd Generation assay with a functional sensitivity of <=0.01 uIU/mL. Performed at [facility], [address], [place], [place] [address]   Basic metabolic panel     Status: Abnormal   Collection Time: 03/20/18  3:44 AM  Result Value Ref Range   Sodium 136 135 - 145 mmol/Sampson   Potassium 4.2 3.5 - 5.1 mmol/Sampson   Chloride 97 (Sampson) 101 - 111 mmol/Sampson   CO2 27 22 - 32 mmol/Sampson   Glucose, Bld 248 (H) 65 - 99 mg/dL   BUN 26 (H) 6 - 20 mg/dL   Creatinine, Ser 1.16 0.61 - 1.24 mg/dL   Calcium 8.4 (Sampson) 8.9 - 10.3 mg/dL   GFR calc non Af Wyvonnia Lora  58 (Sampson) >60 mL/min   GFR calc Af Amer >60 >60 mL/min    Comment: (NOTE) The eGFR has been calculated using the CKD EPI equation. This calculation has not been validated in all clinical situations. eGFR's persistently <60 mL/min signify possible Chronic Kidney Disease.    Anion gap 12 5 - 15    Comment: Performed at [facility], [address] [place], [place] [address]  Troponin I     Status: Abnormal   Collection Time: 03/20/18  3:44 AM  Result Value Ref Range   Troponin I 0.19 (HH) <0.03 ng/mL    Comment: CRITICAL VALUE NOTED.  VALUE IS CONSISTENT WITH PREVIOUSLY REPORTED AND CALLED VALUE. Performed at [facility], [address] [place], [place] [address]   Lactic acid, plasma     Status: Abnormal   Collection Time: 03/20/18  3:44 AM  Result Value Ref Range   Lactic Acid, Venous 2.9 (HH) 0.5 - 1.9 mmol/Sampson    Comment: CRITICAL RESULT CALLED TO, READ BACK BY AND VERIFIED WITH: SMITH,J @ 0419 ON 03/20/18 BY JUW Performed at [facility], [address], [place], [place] [address]   Troponin I     Status: Abnormal   Collection Time: 03/20/18   8:25 AM  Result Value Ref Range   Troponin I 0.19 (HH) <0.03 ng/mL    Comment: CRITICAL VALUE NOTED.  VALUE IS CONSISTENT WITH PREVIOUSLY REPORTED AND CALLED VALUE. Performed at [facility], [address], [place], [place] [address]   Lactic acid, plasma     Status: None   Collection Time: 03/20/18  8:25 AM  Result Value Ref Range   Lactic Acid, Venous 1.7 0.5 - 1.9 mmol/Sampson    Comment: Performed at [facility], [address], [place], [place] [address]  Magnesium     Status: None   Collection Time: 03/20/18  8:25 AM  Result Value Ref Range   Magnesium 2.2 1.7 - 2.4 mg/dL    Comment: Performed at [facility], [address], [place], [place] [address]  Glucose, capillary     Status: Abnormal   Collection Time: 03/20/18  4:57 PM  Result Value Ref Range   Glucose-Capillary 124 (H) 65 - 99 mg/dL  Glucose, capillary     Status: Abnormal   Collection Time: 03/20/18  9:01 PM  Result Value Ref Range   Glucose-Capillary 123 (H) 65 - 99 mg/dL   Comment 1 Notify RN   Basic metabolic panel     Status: Abnormal   Collection Time: 03/21/18  4:26 AM  Result Value Ref Range   Sodium 137 135 - 145 mmol/Sampson   Potassium 3.7 3.5 - 5.1 mmol/Sampson   Chloride 95 (Sampson) 101 - 111 mmol/Sampson   CO2 28 22 - 32 mmol/Sampson   Glucose, Bld 108 (H) 65 - 99 mg/dL   BUN 38 (H) 6 - 20 mg/dL   Creatinine, Ser 0.89 0.61 - 1.24 mg/dL   Calcium 9.0 8.9 - 10.3 mg/dL   GFR calc non Af Amer >60 >60 mL/min   GFR calc Af Amer >60 >60 mL/min    Comment: (NOTE) The eGFR has been calculated using the CKD EPI equation. This calculation has not been validated in all clinical situations. eGFR's persistently <60 mL/min signify possible Chronic Kidney Disease.    Anion gap 14 5 - 15    Comment: Performed at [facility], [address], [place], [place] [address]  CBC with Differential/Platelet     Status: Abnormal   Collection  Time: 03/21/18  4:26 AM  Result Value Ref Range   WBC 12.6 (H) 4.0 - 10.5 K/uL   RBC 3.04 (Sampson) 4.22 - 5.81  MIL/uL   Hemoglobin 8.4 (Sampson) 13.0 - 17.0 g/dL   HCT 27.7 (Sampson) 39.0 - 52.0 %   MCV 91.1 78.0 - 100.0 fL   MCH 27.6 26.0 - 34.0 pg   MCHC 30.3 30.0 - 36.0 g/dL   RDW 19.0 (H) 11.5 - 15.5 %   Platelets 339 150 - 400 K/uL   Neutrophils Relative % 82 %   Neutro Abs 10.2 (H) 1.7 - 7.7 K/uL   Lymphocytes Relative 8 %   Lymphs Abs 1.0 0.7 - 4.0 K/uL   Monocytes Relative 9 %   Monocytes Absolute 1.2 (H) 0.1 - 1.0 K/uL   Eosinophils Relative 1 %   Eosinophils Absolute 0.2 0.0 - 0.7 K/uL   Basophils Relative 0 %   Basophils Absolute 0.0 0.0 - 0.1 K/uL    Comment: Performed at [facility], [address], [place], [place] [address]    ABGS Recent Labs    03/19/18 1834  PHART 7.441  PO2ART 95.9  HCO3 28.3*   CULTURES Recent Results (from the past 240 hour(s))  Blood Culture (routine x 2)     Status: None (Preliminary result)   Collection Time: 03/19/18  3:06 PM  Result Value Ref Range Status   Specimen Description BLOOD RIGHT FOREARM  Final   Special Requests   Final    ANAEROBIC BOTTLE ONLY Blood Culture results may not be optimal due to an inadequate volume of blood received in culture bottles   Culture   Final    NO GROWTH < 24 HOURS Performed at [facility], [address], [place], [place] [address]    Report Status PENDING  Incomplete  Blood Culture (routine x 2)     Status: None (Preliminary result)   Collection Time: 03/19/18  3:15 PM  Result Value Ref Range Status   Specimen Description BLOOD RIGHT HAND  Final   Special Requests   Final    BOTTLES DRAWN AEROBIC ONLY Blood Culture results may not be optimal due to an inadequate volume of blood received in culture bottles   Culture   Final    NO GROWTH < 24 HOURS Performed at [facility], [address] [place], [place] [address]    Report Status PENDING  Incomplete  MRSA PCR Screening     Status: None   Collection Time: 03/19/18  8:08 PM  Result Value Ref Range Status   MRSA by PCR NEGATIVE NEGATIVE Final     Comment:        The GeneXpert MRSA Assay (FDA approved for NASAL specimens only), is one component of a comprehensive MRSA colonization surveillance program. It is not intended to diagnose MRSA infection nor to guide or monitor treatment for MRSA infections. Performed at [facility], [address], [place], [place] [address]    Studies/Results: Dg Chest Portable 1 View  Result Date: 03/19/2018 CLINICAL DATA:  Difficulty breathing for 1 hour with hypoxia EXAM: PORTABLE CHEST 1 VIEW COMPARISON:  02/22/2018 FINDINGS: Cardiac shadow is stable from the prior exam. Postsurgical changes are noted. Vascular congestion is now seen with mild parenchymal edema. No sizable effusion is noted. No bony abnormality is noted. IMPRESSION: Changes of mild CHF. Electronically Signed   By: Inez Catalina M.D.   On: 03/19/2018 15:47    Medications:  Prior to Admission:  Medications Prior to  Admission  Medication Sig Dispense Refill Last Dose  . Amino Acids-Protein Hydrolys (FEEDING SUPPLEMENT, PRO-STAT SUGAR FREE 64,) LIQD Place 30 mLs into feeding tube 2 (two) times daily. 900 mL 0 03/19/2018 at Unknown time  . apixaban (ELIQUIS) 5 MG TABS tablet Take 1 tablet (5 mg total) by mouth 2 (two) times daily. Ok to crush with oral food ( NOT PEG) 60 tablet 3 03/19/2018 at 8am  . cefTRIAXone (ROCEPHIN) 1 g injection Inject 1 g into the muscle once. For infection   03/16/2018  . doxycycline (VIBRAMYCIN) 100 MG capsule Take 100 mg by mouth 2 (two) times daily.   03/19/2018 at Unknown time  . folic acid (FOLVITE) 1 MG tablet Take 1 tablet (1 mg total) by mouth daily.   03/19/2018 at Unknown time  . furosemide (LASIX) 10 mg/mL SOLN Inject 40 mg into the vein once. Intramuscularly one time for CHF for 1 day   03/16/2018  . gabapentin (NEURONTIN) 300 MG capsule Take 600 mg by mouth 2 (two) times daily.   03/19/2018 at Unknown time  . ipratropium-albuterol (DUONEB) 0.5-2.5 (3) MG/3ML SOLN Take 3 mLs by nebulization every 8  (eight) hours. For 2 days   03/17/2018  . levothyroxine (SYNTHROID, LEVOTHROID) 75 MCG tablet Take 1 tablet (75 mcg total) by mouth daily before breakfast.   03/19/2018 at Unknown time  . Melatonin 5 MG CAPS Take 10 capsules by mouth at bedtime.   03/18/2018 at Unknown time  . metoprolol tartrate (LOPRESSOR) 50 MG tablet Take 1 tablet (50 mg total) by mouth 2 (two) times daily.   03/19/2018 at 8am  . mirtazapine (REMERON) 15 MG tablet Take 1 tablet (15 mg total) by mouth at bedtime.   03/18/2018 at Unknown time  . Nutritional Supplements (FEEDING SUPPLEMENT, JEVITY 1.2 CAL,) LIQD Place 1,000 mLs into feeding tube continuous.  0 03/19/2018 at Unknown time  . omeprazole (PRILOSEC) 20 MG capsule Take 1 capsule (20 mg total) by mouth 2 (two) times daily before a meal.   03/19/2018 at Unknown time  . simvastatin (ZOCOR) 40 MG tablet Take 0.5 tablets (20 mg total) by mouth at bedtime. 30 tablet 12 03/18/2018 at Unknown time  . traMADol (ULTRAM) 50 MG tablet Take 50 mg by mouth every 6 (six) hours as needed.   03/17/2018  . Water For Irrigation, Sterile (FREE WATER) SOLN Place 200 mLs into feeding tube 4 (four) times daily.   03/19/2018 at Unknown time  . white petrolatum (VASELINE) GEL Apply 1 application topically as needed for lip care.  0 03/19/2018 at Unknown time  . albuterol (PROVENTIL HFA) 108 (90 Base) MCG/ACT inhaler Inhale 2 puffs into the lungs every 6 (six) hours as needed for wheezing or shortness of breath. 1 Inhaler 3 unknown  . diltiazem (CARDIZEM CD) 240 MG 24 hr capsule HOLD until seen by PCP 90 capsule 3   . furosemide (LASIX) 20 MG tablet HOLD until seen by PCP 90 tablet 3 03/10/2018  . lisinopril (PRINIVIL,ZESTRIL) 40 MG tablet HOLD until seen by PCP 90 tablet 3 unknown  . Maltodextrin-Xanthan Gum (RESOURCE THICKENUP CLEAR) POWD Take 240 g by mouth as needed.   unknown  . nitroGLYCERIN (NITROSTAT) 0.4 MG SL tablet Place 1 tablet (0.4 mg total) under the tongue every 5 (five) minutes as needed for  chest pain. 30 tablet 9 unknown   Scheduled: . aspirin EC  81 mg Oral Daily  . budesonide (PULMICORT) nebulizer solution  0.5 mg Nebulization BID  . chlorhexidine  15  mL Mouth Rinse BID  . feeding supplement (PRO-STAT SUGAR FREE 64)  30 mL Per Tube BID  . folic acid  1 mg Per Tube Daily  . free water  200 mL Per Tube QID  . furosemide  20 mg Intravenous Q12H  . heparin  5,000 Units Subcutaneous Q8H  . insulin aspart  0-9 Units Subcutaneous TID WC  . levothyroxine  75 mcg Per Tube QAC breakfast  . mouth rinse  15 mL Mouth Rinse q12n4p  . metoprolol tartrate  12.5 mg Per Tube BID  . mirtazapine  15 mg Per Tube QHS  . pantoprazole  40 mg Oral Daily  . simvastatin  20 mg Per Tube QHS  . sodium chloride flush  3 mL Intravenous Q12H   Continuous: . sodium chloride    . feeding supplement (JEVITY 1.2 CAL) 1,000 mL (03/20/18 2135)   RXY:VOPFYT chloride, acetaminophen, ipratropium-albuterol, morphine injection, nitroGLYCERIN, ondansetron (ZOFRAN) IV, sodium chloride flush, white petrolatum  Assesment: He was admitted with acute on chronic hypoxic respiratory failure.  He has acute on chronic diastolic heart failure and he is down about 2 Sampson.  His troponin level was elevated and he does have coronary disease status post bypass grafting but the troponin elevation is felt to be related to his current illness.  He had lactic acidosis and that has improved  He is anemic likely multifactorial Active Problems:   Essential hypertension, benign   COPD (chronic obstructive pulmonary disease) (HCC)   PAF (paroxysmal atrial fibrillation) (HCC)   Acute on chronic diastolic CHF (congestive heart failure) (HCC)   Thyroid cancer (HCC)   CAD (coronary artery disease)   Hypothyroidism (acquired)   Acute on chronic respiratory failure with hypoxia (HCC)   Elevated troponin   Lactic acid acidosis    Plan: Continue current treatments.  Continue diuresis.  Cardiology consultation has been  requested    LOS: 2 days   John Sampson 03/21/2018, 8:06 AM

## 2018-03-21 NOTE — Consult Note (Addendum)
Cardiology Consult    Patient ID: John Sampson; [id number]; 1936/05/27   Admit date: 03/19/2018 Date of Consult: 03/21/2018  Primary Care Provider: Sinda Du, MD Primary Cardiologist: Rozann Lesches, MD   Patient Profile    John Sampson is a 82 y.o. male with past medical history of CAD (s/p CABG in 2003, low-risk NST in 12/2017), PAF (on Eliquis), chronic diastolic CHF, recurrent thyroid cancer, HTN, HLD, COPD and carotid artery stenosis who is being seen today for the evaluation of CHF at the request of Dr. Luan Sampson.   History of Present Illness    John Sampson was last evaluated by Ermalinda Barrios, PA-C in 12/2017 following a recent stress test for cardiac clearance which showed findings consistent with a prior large inferior MI with no acute ischemia and was overall a low-risk study. BP was initially elevated at 180/92 and he felt this was secondary to increased financial stress due to his multiple medical-related expenses. Lisinopril was further titrated to 40 mg daily and he was informed to follow BP at home with these changes. Weight was stable at 199 lbs (down from 203 lbs in 11/2017).   In the interim, he was admitted from 02/14/2018 to 03/02/2018 for dysphasia which was secondary to esophageal swelling. He underwent a modified barium swallow study which demonstrated swelling and obstruction of the upper cervical esophagus. Due to persistent symptoms, a CT of the neck was performed which showed fairly extensive recurrence of thyroid cancer in addition to the trachea and esophagus. ENT was consulted and recommended rehabilitation prior to discussion of treatment options. A PEG tube was placed on 02/24/2018 by IR and he was discharged to SNF.   He presented back to [facility] ED on 03/19/2018 for evaluation of worsening dyspnea at rest and with activity and oxygen saturations were initially in the 70's. He reports having associated edema and orthopnea but denies any recent chest pain, PND,  palpitations, dizziness or presyncope.   Initial labs showed WBC 8.2, Hgb 9.5, platelets 329, Na+ 137, K+ 4.5, and creatinine 0.93. BNP elevated at 395. Lactic acid peaked at 4.24. TSH 6.26.  Initial and cyclic troponin values have been flat at 0.29, 0.24, 0.19, and 0.19.  CXR showed findings consistent with mild CHF. EKG showed sinus bradycardia, HR 58, with diffuse T-wave abnormalities along anterolateral leads (similar to prior tracings).   He was admitted for further treatment of acute on chronic hypoxic respiratory failure in the setting of CHF exacerbation. He was started on IV Lasix 20 mg twice daily along with Vancomycin and Zosyn for sepsis. A repeat echocardiogram was obtained and showed a preserved EF of 60 to 65% with possible hypokinesis of the basal inferior myocardium and grade 1 diastolic dysfunction.  Was noted to have mild MR but no significant abnormalities noted.  Antibiotic therapy has been discontinued but he has remained on IV Lasix.  He is overall -2.4 L since admission and weight has been stable at 190 lbs.    Past Medical History:  Diagnosis Date  . Atrial flutter ([place])    on Eliquis  . AV malformation of GI tract   . COPD (chronic obstructive pulmonary disease) ([place]) 3.12.14   2D Echo, EF 50-55%  . Coronary atherosclerosis of native coronary artery    Multivessel status post CABG  . Gastric ulcer    Small - nonbleeding  . GERD (gastroesophageal reflux disease)   . Headache(784.0)   . Hypothyroidism   . Iron deficiency anemia  Negative Givens capsule study   . Myocardial infarction ([place])   . PAD (peripheral artery disease) (H[facility])    Moderate bilateral SFA disease at angiography 01/2013  . Pituitary macroadenoma ([place])   . Thyroid cancer ([place])   . Vocal cord paralysis    left    Past Surgical History:  Procedure Laterality Date  . ABDOMINAL AORTAGRAM N/A 02/19/2013   Procedure: ABDOMINAL Maxcine Ham;  Surgeon: [name], MD;  Location: [facility] CATH LAB;   Service: Cardiovascular;  Laterality: N/A;  . CARDIAC CATHETERIZATION    . CARDIOVERSION N/A 06/16/2015   Procedure: CARDIOVERSION;  Surgeon: Satira Sark, MD;  Location: [facility] ORS;  Service: Cardiovascular;  Laterality: N/A;  . COLONOSCOPY  08/18/2012   Procedure: COLONOSCOPY;  Surgeon: [name], MD;  Location[facility] ENDO SU[facility];  Service: Endoscopy;  Laterality: N/A;  1:25  . COLONOSCOPY N/A 09/12/2017   Procedure: COLONOSCOPY;  Surgeon: [name], MD;  [facility] END[facility];  Service: Endoscopy;  Laterality: N/A;  . CORONARY ARTERY BYPASS GRAFT  2003   5 grafts - details not clear  . CRANIOTOMY  12/31/2011   Procedure: CRANIOTOMY HYPOPHYSECTOMY TRANSNASAL APPROACH;  Surgeon: [name], MD;  Location: [place] NEURO ORS;  Service: Neurosurgery;  Laterality: N/A;  Transphenoidal Hypophysectomy With Fat Graft Harvest from right abdomen   . ESOPHAGOGASTRODUODENOSCOPY  08/18/2012   Procedure: ESOPHAGOGASTRODUODENOSCOPY (EGD);  Surgeon: [name], MD;  [facility];  Service: Endoscopy;  Laterality: N/A;  . ESOPHAGOGASTRODUODENOSCOPY N/A 03/31/2017   Procedure: ESOPHAGOGASTRODUODENOSCOPY (EGD);  Surgeon: [name], MD;  [facility];  Service: Endoscopy;  Laterality: N/A;  . ESOPHAGOGASTRODUODENOSCOPY N/A 09/12/2017   Procedure: ESOPHAGOGASTRODUODENOSCOPY (EGD);  Surgeon: [name], MD;  [facility];  Service: Endoscopy;  Laterality: N/A;  10:40  . EYE SURGERY  2012  . GIVENS CAPSULE STUDY N/A 01/25/2013   Procedure: GIVENS CAPSULE STUDY;  Surgeon: [name], MD;  [facility];  Service: Endoscopy;  Laterality: N/A;  730  . HOT HEMOSTASIS  03/31/2017   Procedure: HOT HEMOSTASIS (ARGON PLASMA COAGULATION/BICAP);  Surgeon: [name], MD;  [facility];  Service: Endoscopy;;  duodenum  . IR GASTROSTOMY TUBE MOD SED  02/24/2018  . LARYNGOPLASTY Left 09/14/2016   Procedure: LARYNGOPLASTY;  Surgeon: [name], MD;  Location: [place];  Service: ENT;  Laterality: Left;  . LOWER EXTREMITY ANGIOGRAM N/A 02/19/2013   Procedure: LOWER EXTREMITY ANGIOGRAM;  Surgeon: [name], MD;  Location: [facility] CATH LAB;  Service: Cardiovascular;  Laterality: N/A;  . MICROLARYNGOSCOPY N/A 02/10/2018   Procedure: MICROLARYNGOSCOPY WITH VOCAL CORD INJECTION;  Surgeon: [name], MD;  Location: [place];  Service: ENT;  Laterality: N/A;  . PERIPHERAL VASCULAR CATHETERIZATION N/A 01/19/2016   Procedure: Abdominal Aortogram;  Surgeon: Conrad Leggett, MD;  Location: [place] CV LAB;  Service: Cardiovascular;  Laterality: N/A;  . PERIPHERAL VASCULAR CATHETERIZATION Bilateral 01/19/2016   Procedure: Lower Extremity Angiography;  Surgeon: Conrad Uniondale, MD;  Location: [place] CV LAB;  Service: Cardiovascular;  Laterality: Bilateral;  . PV Angiogram  02/19/13   Indications: slow healing left calf ulcer  . Stress Myocardial Perfusion  12/08/2011   Indications: Abnormal EKG, Eval of prior GABG  . TEE WITHOUT CARDIOVERSION N/A 06/16/2015   Procedure: TRANSESOPHAGEAL ECHOCARDIOGRAM (TEE);  Surgeon: Satira Sark, MD;  Location: [facility];  Service: Cardiovascular;  Laterality: N/A;  . THYROIDECTOMY N/A  09/14/2016   Procedure: TOTAL THYROIDECTOMY;  Surgeon: [name], MD;  Location: [place];  Service: ENT;  Laterality: N/A;  . TONSILLECTOMY  Age 47     Home Medications:  Prior to Admission medications   Medication Sig Start Date End Date Taking? Authorizing Provider  Amino Acids-Protein Hydrolys (FEEDING SUPPLEMENT, PRO-STAT SUGAR FREE 64,) LIQD Place 30 mLs into feeding tube 2 (two) times daily. 03/02/18  Yes Oretha Milch D, MD  apixaban (ELIQUIS) 5 MG TABS tablet Take 1 tablet (5 mg total) by mouth 2 (two) times daily. Ok to crush with oral food ( NOT PEG) 03/02/18  Yes Oretha Milch D, MD  cefTRIAXone (ROCEPHIN) 1 g injection Inject 1 g into the muscle once. For infection   Yes [provider]  doxycycline (VIBRAMYCIN) 100 MG capsule Take 100 mg by mouth 2 (two) times daily.   Yes [provider]  folic acid (FOLVITE) 1 MG tablet Take 1 tablet (1 mg total) by mouth daily. 03/02/18  Yes Oretha Milch D, MD  furosemide (LASIX) 10 mg/mL SOLN Inject 40 mg into the vein once. Intramuscularly one time for CHF for 1 day   Yes [provider]  gabapentin (NEURONTIN) 300 MG capsule Take 600 mg by mouth 2 (two) times daily.   Yes [provider]  ipratropium-albuterol (DUONEB) 0.5-2.5 (3) MG/3ML SOLN Take 3 mLs by nebulization every 8 (eight) hours. For 2 days   Yes [provider]  levothyroxine (SYNTHROID, LEVOTHROID) 75 MCG tablet Take 1 tablet (75 mcg total) by mouth daily before breakfast. 03/03/18  Yes Oretha Milch D, MD  Melatonin 5 MG CAPS Take 10 capsules by mouth at bedtime.   Yes [provider]  metoprolol tartrate (LOPRESSOR) 50 MG tablet Take 1 tablet (50 mg total) by mouth 2 (two) times daily. 03/02/18  Yes Oretha Milch D, MD  mirtazapine (REMERON) 15 MG tablet Take 1 tablet (15 mg total) by mouth at bedtime. 03/02/18  Yes Oretha Milch D, MD  Nutritional Supplements (FEEDING SUPPLEMENT, JEVITY 1.2 CAL,) LIQD Place 1,000 mLs into feeding tube continuous. 03/02/18  Yes Oretha Milch D, MD  omeprazole (PRILOSEC) 20 MG capsule Take 1 capsule (20 mg total) by mouth 2 (two) times daily before a meal. 03/02/18  Yes Oretha Milch D, MD  simvastatin (ZOCOR) 40 MG tablet Take 0.5 tablets (20 mg total) by mouth at bedtime. 03/02/18  Yes Oretha Milch D, MD  traMADol (ULTRAM) 50 MG tablet Take 50 mg by mouth every 6 (six) hours as needed.   Yes [provider]  Water For Irrigation, Sterile (FREE WATER) SOLN Place 200 mLs into feeding tube 4 (four) times daily. 03/02/18  Yes Oretha Milch D, MD  white petrolatum (VASELINE) GEL Apply 1 application topically as needed for lip care. 03/02/18  Yes Oretha Milch D, MD  albuterol (PROVENTIL  HFA) 108 (90 Base) MCG/ACT inhaler Inhale 2 puffs into the lungs every 6 (six) hours as needed for wheezing or shortness of breath. 03/02/18   Desiree Hane, MD  diltiazem (CARDIZEM CD) 240 MG 24 hr capsule HOLD until seen by PCP 03/02/18   Desiree Hane, MD  furosemide (LASIX) 20 MG tablet HOLD until seen by PCP 03/02/18   Desiree Hane, MD  lisinopril (PRINIVIL,ZESTRIL) 40 MG tablet HOLD until seen by PCP 03/02/18   Desiree Hane, MD  Maltodextrin-Xanthan Gum (RESOURCE THICKENUP CLEAR) POWD Take 240 g by mouth as needed. 03/02/18   Desiree Hane, MD  nitroGLYCERIN (NITROSTAT) 0.4 MG SL tablet Place 1 tablet (0.4 mg total) under the tongue every 5 (five) minutes as needed for chest pain. 03/02/18   Desiree Hane, MD    Inpatient Medications: Scheduled Meds: . aspirin EC  81 mg Oral Daily  . budesonide (PULMICORT) nebulizer solution  0.5 mg Nebulization BID  . chlorhexidine  15 mL Mouth Rinse BID  . feeding supplement (PRO-STAT SUGAR FREE 64)  30 mL Per Tube BID  . folic acid  1 mg Per Tube Daily  . free water  200 mL Per Tube QID  . furosemide  20 mg Intravenous Q12H  . heparin  5,000 Units Subcutaneous Q8H  . insulin aspart  0-9 Units Subcutaneous TID WC  . levothyroxine  75 mcg Per Tube QAC breakfast  . mouth rinse  15 mL Mouth Rinse q12n4p  . metoprolol tartrate  12.5 mg Per Tube BID  . mirtazapine  15 mg Per Tube QHS  . pantoprazole  40 mg Oral Daily  . simvastatin  20 mg Per Tube QHS  . sodium chloride flush  3 mL Intravenous Q12H   Continuous Infusions: . sodium chloride    . feeding supplement (JEVITY 1.2 CAL) 1,000 mL (03/20/18 2135)   PRN Meds: sodium chloride, acetaminophen, ipratropium-albuterol, morphine injection, nitroGLYCERIN, ondansetron (ZOFRAN) IV, sodium chloride flush, white petrolatum  Allergies:    Allergies  Allergen Reactions  . Morphine And Related Nausea And Vomiting    Social History:   Social History   Socioeconomic History  . Marital  status: Widowed    Spouse name: Not on file  . Number of children: Not on file  . Years of education: Not on file  . Highest education level: Not on file  Occupational History  . Occupation: retired  Scientific laboratory technician  . Financial resource strain: Not on file  . Food insecurity:    Worry: Not on file    Inability: Not on file  . Transportation needs:    Medical: Not on file    Non-medical: Not on file  Tobacco Use  . Smoking status: Former Smoker    Packs/day: 3.00    Years: 45.00    Pack years: 135.00    Types: Cigarettes    Start date: 01/24/1955    Last attempt to quit: 01/23/2002    Years since quitting: 16.1  . Smokeless tobacco: Never Used  Substance and Sexual Activity  . Alcohol use: No    Alcohol/week: 0.0 oz    Comment: "I quit drinking and chasing women" 30 years ago  . Drug use: No  . Sexual activity: Never    Partners: Female  Lifestyle  . Physical activity:    Days per week: Not on file    Minutes per session: Not on file  . Stress: Not on file  Relationships  . Social connections:    Talks on phone: Not on file    Gets together: Not on file    Attends religious service: Not on file    Active member of club or organization: Not on file    Attends meetings of clubs or organizations: Not on file    Relationship status: Not on file  . Intimate partner violence:    Fear of current or ex partner: Not on file    Emotionally abused: Not on file    Physically abused: Not on file    Forced sexual activity: Not on file  Other Topics Concern  . Not on file  Social History Narrative  . Not on file     Family History:    Family History  Problem Relation Age of Onset  . CAD Mother   . Heart disease Father        before age 39      Review of Systems    General:  No chills, fever, night sweats or weight changes.  Cardiovascular:  No chest pain, palpitations, paroxysmal nocturnal dyspnea. Positive for dyspnea on exertion, edema, and orthopnea.    Dermatological: No rash, lesions/masses Respiratory: Positive for productive cough and dyspnea. Urologic: No hematuria, dysuria Abdominal:   No nausea, vomiting, diarrhea, bright red blood per rectum, melena, or hematemesis Neurologic:  No visual changes, wkns, changes in mental status. All other systems reviewed and are otherwise negative except as noted above.  Physical Exam/Data    Vitals:   03/21/18 0630 03/21/18 0645 03/21/18 0746 03/21/18 0814  BP: (!) 118/52 (!) 137/51    Pulse: (!) 56 61 61   Resp: 14 15 15    Temp:   97.7 F (36.5 C)   TempSrc:   Axillary   SpO2: 98% 99% 97% 98%  Weight:      Height:        Intake/Output Summary (Last 24 hours) at 03/21/2018 0857 Last data filed at 03/21/2018 0500 Gross per 24 hour  Intake 1510 ml  Output 2400 ml  Net -890 ml   Filed Weights   03/19/18 2003 03/20/18 0500 03/21/18 0500  Weight: 190 lb 4.1 oz (86.3 kg) 190 lb 4.1 oz (86.3 kg) 190 lb 11.2 oz (86.5 kg)   Body mass index is 28.16 kg/m.   General: Chronically-ill appearing Caucasian male, currently in NAD Psych: Normal affect. Neuro: Alert and oriented X 3. Moves all extremities spontaneously. HEENT: Normal  Neck: Supple without bruits. JVD at 9cm. Lungs:  Resp regular and unlabored, decreased breath sounds along bases bilaterally. Heart: RRR no s3, s4, or murmurs. Abdomen: Soft, non-tender, non-distended, BS + x 4.  Extremities: No clubbing, cyanosis or lower extremity edema. DP/PT/Radials 2+ and equal bilaterally.   EKG:  The EKG was personally reviewed and demonstrates: Sinus bradycardia, HR 58, with diffuse T-wave abnormalities along anterolateral leads (similar to prior tracings).    Labs/Studies     Relevant CV Studies:  NST: 01/13/2018  There was no ST segment deviation noted during stress.  Findings consistent with prior large inferior/inferolateral myocardial infarction.  This is a low risk study. There is no current myocardium at  jeopardy.  The left ventricular ejection fraction is normal (55-65%).  Echocardiogram: 03/20/2018 Study Conclusions  - Left ventricle: The cavity size was normal. Wall thickness was   increased in a pattern of moderate LVH. Systolic function was   normal. The estimated ejection fraction was in the range of 60%   to 65%. Possible hypokinesis of the basalinferior myocardium.   Doppler parameters are consistent with abnormal left ventricular   relaxation (grade 1 diastolic dysfunction). - Aortic valve: Moderately calcified annulus. Trileaflet; mildly   calcified leaflets. - Mitral valve: Mildly to moderately calcified annulus. Mildly   calcified leaflets . There was mild regurgitation. - Left atrium: The atrium was mildly dilated. - Right atrium: Central venous pressure (est): 3 mm Hg. - Atrial septum: No defect or patent foramen ovale was identified. - Tricuspid valve: There was trivial regurgitation. - Pulmonary arteries: Systolic pressure could not be accurately   estimated. - Pericardium, extracardiac: A prominent pericardial fat pad was   present.  Laboratory Data:  Chemistry Recent Labs  Lab 03/19/18 1516 03/20/18 0344 03/21/18 0426  NA 137 136 137  K 4.5 4.2 3.7  CL 98* 97* 95*  CO2 28 27 28   GLUCOSE 114* 248* 108*  BUN 22* 26* 38*  CREATININE 0.93 1.16 0.89  CALCIUM 9.0 8.4* 9.0  GFRNONAA >60 58* >60  GFRAA >60 >60 >60  ANIONGAP 11 12 14     Recent Labs  Lab 03/19/18 1516  PROT 6.1*  ALBUMIN 3.1*  AST 27  ALT 25  ALKPHOS 70  BILITOT 0.7   Hematology Recent Labs  Lab 03/19/18 1516 03/21/18 0426  WBC 8.2 12.6*  RBC 3.48* 3.04*  HGB 9.5* 8.4*  HCT 32.1* 27.7*  MCV 92.2 91.1  MCH 27.3 27.6  MCHC 29.6* 30.3  RDW 19.2* 19.0*  PLT 329 339   Cardiac Enzymes Recent Labs  Lab 03/19/18 1516 03/19/18 2058 03/20/18 0344 03/20/18 0825  TROPONINI 0.29* 0.24* 0.19* 0.19*   No results for input(s): TROPIPOC in the last 168 hours.  BNP Recent Labs   Lab 03/19/18 1516  BNP 395.0*    DDimer No results for input(s): DDIMER in the last 168 hours.  Radiology/Studies:  Dg Chest Portable 1 View  Result Date: 03/19/2018 CLINICAL DATA:  Difficulty breathing for 1 hour with hypoxia EXAM: PORTABLE CHEST 1 VIEW COMPARISON:  02/22/2018 FINDINGS: Cardiac shadow is stable from the prior exam. Postsurgical changes are noted. Vascular congestion is now seen with mild parenchymal edema. No sizable effusion is noted. No bony abnormality is noted. IMPRESSION: Changes of mild CHF. Electronically Signed   By: Inez Catalina M.D.   On: 03/19/2018 15:47     Assessment & Plan    1. Acute on Chronic Diastolic CHF - presented with worsening dyspnea on exertion, orthopnea, and lower extremity edema. Unsure of associated weight gain as weight has declined since initiation of tube feeds.  -  BNP elevated at 395 on admission and CXR showed findings consistent with mild CHF. Echo shows a preserved EF of 60-65% with Grade 1 DD. He was started on IV Lasix 20 mg twice daily and is overall -2.4 L since admission and weight has been stable at 190 lbs. He still has rales on examination today and is requiring intermittent BiPAP. Will further titrate Lasix to 40mg  BID as BP and kidney function have remained stable. Continue to follow I&O's along with daily weights. Repeat BMET in AM.   2. CAD/ Elevated Troponin  - s/p CABG in 2003 with low-risk NST in 12/2017. He denies any recent chest pain and did experience worsening dyspnea in the setting of CHF but has experienced improvement in his dyspnea with diuresis.  -  Initial and cyclic troponin values have been flat at 0.29, 0.24, 0.19, and 0.19. Echocardiogram shows a preserved EF of 60 to 65% with possible hypokinesis of the basal inferior myocardium and grade 1 diastolic dysfunction. - with his multiple medical conditions and recent low-risk NST, would not pursue further ischemic evaluation at this time. Troponin elevation  likely secondary to demand ischemia in the setting of his CHF exacerbation.   3. PAF - maintaining NSR this admission. Continue to follow on telemetry.  - Eliquis held on admission in case invasive cardiac evaluation was indicated. No plans for invasive evaluation as outlined above. Will resume at PTA dosing of 5mg  BID.   4. Recurrent thyroid cancer - recently diagnosed during his last admission for persistent dysphagia. ENT recommended outpatient follow-up once physical strength improved.  PEG tube in place.    For questions or updates, please contact [place] Please consult [email or web address] for contact info under Cardiology/STEMI.  Signed, Erma Heritage, PA-C 03/21/2018, 8:57 AM Pager: [phone]   Attending note  Patient seen and discussed with PA Ahmed Prima, I agree with her documentation above. 82 yo male history of CAD with prior CABG, PAF, COPD, thyroid cancer, admitted SOB and hypoxia.  K 4.2, Cr 1.16, BNP 395 WBC 8.2, Hgb 9.5 Plt 329 Trop 0.29-->0.24-->0.19-->0.19--> CXR mild CHF Echo 02/2018: LVEF 06-01%, grade I diastolic dysfunction, possible hypokinesis basal inferior myocardium EKG SR, chronic ST/T changes 12/2017 nuclear stress: inferior infarct, no ischemia   Mild trop elevation trending down likely demand ischemia in setting of hypoxia and CHF. His diuretic was held at early 02/2018 discharge due to low bp's.  Recent nuclear stress test without ischemia. Echo with stable normal LVEF. No plans for additional ischemic testing. Negative 834mL yesterday, negative 2.4 L since admission. Renal function overall stable, mild uptrend in BUN. Had been on lasix 20mg  IV bid, increased to 40mg  today. Continue rate control and anticoag for his PAF.   Continue IV diuretics today   Carlyle Dolly MD

## 2018-03-21 NOTE — Progress Notes (Signed)
Removed patient from BIPAP and placed patient on 3L O2 via nasal cannula. Patient maintaining SATs 96%, HR 67 and RR 16. RT will continue to monitor.

## 2018-03-22 DIAGNOSIS — L8990 Pressure ulcer of unspecified site, unspecified stage: Secondary | ICD-10-CM | POA: Diagnosis present

## 2018-03-22 DIAGNOSIS — R748 Abnormal levels of other serum enzymes: Secondary | ICD-10-CM

## 2018-03-22 DIAGNOSIS — C73 Malignant neoplasm of thyroid gland: Secondary | ICD-10-CM

## 2018-03-22 DIAGNOSIS — I480 Paroxysmal atrial fibrillation: Secondary | ICD-10-CM

## 2018-03-22 LAB — GLUCOSE, CAPILLARY
GLUCOSE-CAPILLARY: 118 mg/dL — AB (ref 65–99)
Glucose-Capillary: 115 mg/dL — ABNORMAL HIGH (ref 65–99)
Glucose-Capillary: 107 mg/dL — ABNORMAL HIGH (ref 65–99)
Glucose-Capillary: 109 mg/dL — ABNORMAL HIGH (ref 65–99)

## 2018-03-22 LAB — BASIC METABOLIC PANEL
ANION GAP: 12 (ref 5–15)
BUN: 43 mg/dL — ABNORMAL HIGH (ref 6–20)
CO2: 31 mmol/L (ref 22–32)
Calcium: 8.7 mg/dL — ABNORMAL LOW (ref 8.9–10.3)
Chloride: 94 mmol/L — ABNORMAL LOW (ref 101–111)
Creatinine, Ser: 0.97 mg/dL (ref 0.61–1.24)
GFR calc non Af Amer: >60 mL/min (ref >60)
Glucose, Bld: 97 mg/dL (ref 65–99)
Potassium: 3.8 mmol/L (ref 3.5–5.1)
SODIUM: 137 mmol/L (ref 135–145)

## 2018-03-22 NOTE — Progress Notes (Signed)
Subjective: He says he feels better.  He did use BiPAP he says last night but he is on nasal cannula now and documentation from respiratory said that he was doing well without BiPAP.  He is on increased dose of Lasix and he is now down 3.7 L.  He says his breathing is better.  No chest pain.  He does complain of some irritation of his nose from the BiPAP.  No abdominal pain or nausea or vomiting.  Objective: Vital signs in last 24 hours: Temp:  [97.7 F (36.5 C)-99.4 F (37.4 C)] 99.4 F (37.4 C) (04/24 0400) Pulse Rate:  [59-73] 71 (04/24 0400) Resp:  [15-23] 20 (04/24 0400) BP: (85-141)/(35-93) 129/46 (04/24 0400) SpO2:  [91 %-100 %] 95 % (04/24 0400) FiO2 (%):  [40 %] 40 % (04/23 0814) Weight:  [84.4 kg (186 lb 1.1 oz)] 84.4 kg (186 lb 1.1 oz) (04/24 0500) Weight change: -2.1 kg (-4 lb 10.1 oz) Last BM Date: 03/18/18  Intake/Output from previous day: 04/23 0701 - 04/24 0700 In: 1503 [I.V.:3; NG/GT:1500] Out: 2800 [Urine:2800]  PHYSICAL EXAM General appearance: alert, cooperative and no distress Resp: He still has some bibasilar crackles but is clearer than yesterday morning Cardio: regular rate and rhythm, S1, S2 normal, no murmur, click, rub or gallop GI: soft, non-tender; bowel sounds normal; no masses,  no organomegaly Extremities: extremities normal, atraumatic, no cyanosis or edema He does have some irritation of the bridge of his nose from the BiPAP mask  Lab Results:  Results for orders placed or performed during the hospital encounter of 03/19/18 (from the past 48 hour(s))  Troponin I     Status: Abnormal   Collection Time: 03/20/18  8:25 AM  Result Value Ref Range   Troponin I 0.19 (HH) <0.03 ng/mL    Comment: CRITICAL VALUE NOTED.  VALUE IS CONSISTENT WITH PREVIOUSLY REPORTED AND CALLED VALUE. Performed at [facility], [address], [place], [place] [address]   Lactic acid, plasma     Status: None   Collection Time: 03/20/18  8:25 AM  Result Value Ref Range    Lactic Acid, Venous 1.7 0.5 - 1.9 mmol/L    Comment: Performed at [facility], [address] [place], [place] [address]  Magnesium     Status: None   Collection Time: 03/20/18  8:25 AM  Result Value Ref Range   Magnesium 2.2 1.7 - 2.4 mg/dL    Comment: Performed at [facility], [address], [place], [place] [address]  Glucose, capillary     Status: Abnormal   Collection Time: 03/20/18  4:57 PM  Result Value Ref Range   Glucose-Capillary 124 (H) 65 - 99 mg/dL  Glucose, capillary     Status: Abnormal   Collection Time: 03/20/18  9:01 PM  Result Value Ref Range   Glucose-Capillary 123 (H) 65 - 99 mg/dL   Comment 1 Notify RN   Basic metabolic panel     Status: Abnormal   Collection Time: 03/21/18  4:26 AM  Result Value Ref Range   Sodium 137 135 - 145 mmol/L   Potassium 3.7 3.5 - 5.1 mmol/L   Chloride 95 (L) 101 - 111 mmol/L   CO2 28 22 - 32 mmol/L   Glucose, Bld 108 (H) 65 - 99 mg/dL   BUN 38 (H) 6 - 20 mg/dL   Creatinine, Ser 0.89 0.61 - 1.24 mg/dL   Calcium 9.0 8.9 - 10.3 mg/dL   GFR calc non Af Amer >60 >60 mL/min  GFR calc Af Amer >60 >60 mL/min    Comment: (NOTE) The eGFR has been calculated using the CKD EPI equation. This calculation has not been validated in all clinical situations. eGFR's persistently <60 mL/min signify possible Chronic Kidney Disease.    Anion gap 14 5 - 15    Comment: Performed at [facility], [address], [place], [place] [address]  CBC with Differential/Platelet     Status: Abnormal   Collection Time: 03/21/18  4:26 AM  Result Value Ref Range   WBC 12.6 (H) 4.0 - 10.5 K/uL   RBC 3.04 (L) 4.22 - 5.81 MIL/uL   Hemoglobin 8.4 (L) 13.0 - 17.0 g/dL   HCT 27.7 (L) 39.0 - 52.0 %   MCV 91.1 78.0 - 100.0 fL   MCH 27.6 26.0 - 34.0 pg   MCHC 30.3 30.0 - 36.0 g/dL   RDW 19.0 (H) 11.5 - 15.5 %   Platelets 339 150 - 400 K/uL   Neutrophils Relative % 82 %   Neutro Abs 10.2 (H) 1.7 - 7.7 K/uL   Lymphocytes Relative 8 %   Lymphs Abs 1.0 0.7 -  4.0 K/uL   Monocytes Relative 9 %   Monocytes Absolute 1.2 (H) 0.1 - 1.0 K/uL   Eosinophils Relative 1 %   Eosinophils Absolute 0.2 0.0 - 0.7 K/uL   Basophils Relative 0 %   Basophils Absolute 0.0 0.0 - 0.1 K/uL    Comment: Performed at [facility], [address] [place], [place] [address]  Glucose, capillary     Status: Abnormal   Collection Time: 03/21/18  7:49 AM  Result Value Ref Range   Glucose-Capillary 125 (H) 65 - 99 mg/dL  Glucose, capillary     Status: Abnormal   Collection Time: 03/21/18 11:16 AM  Result Value Ref Range   Glucose-Capillary 117 (H) 65 - 99 mg/dL  Glucose, capillary     Status: Abnormal   Collection Time: 03/21/18  4:22 PM  Result Value Ref Range   Glucose-Capillary 118 (H) 65 - 99 mg/dL  Glucose, capillary     Status: Abnormal   Collection Time: 03/21/18  9:23 PM  Result Value Ref Range   Glucose-Capillary 103 (H) 65 - 99 mg/dL   Comment 1 Notify RN    Comment 2 Document in Chart   Basic metabolic panel     Status: Abnormal   Collection Time: 03/22/18  3:56 AM  Result Value Ref Range   Sodium 137 135 - 145 mmol/L   Potassium 3.8 3.5 - 5.1 mmol/L   Chloride 94 (L) 101 - 111 mmol/L   CO2 31 22 - 32 mmol/L   Glucose, Bld 97 65 - 99 mg/dL   BUN 43 (H) 6 - 20 mg/dL   Creatinine, Ser 0.97 0.61 - 1.24 mg/dL   Calcium 8.7 (L) 8.9 - 10.3 mg/dL   GFR calc non Af Amer >60 >60 mL/min   GFR calc Af Amer >60 >60 mL/min    Comment: (NOTE) The eGFR has been calculated using the CKD EPI equation. This calculation has not been validated in all clinical situations. eGFR's persistently <60 mL/min signify possible Chronic Kidney Disease.    Anion gap 12 5 - 15    Comment: Performed at [facility], [address] [place], [place] [address]    ABGS Recent Labs    03/19/18 1834  PHART 7.441  PO2ART 95.9  HCO3 28.3*   CULTURES Recent Results (from the past 240 hour(s))  Blood Culture (routine  x 2)     Status: None (Preliminary result)   Collection  Time: 03/19/18  3:06 PM  Result Value Ref Range Status   Specimen Description BLOOD RIGHT FOREARM  Final   Special Requests   Final    ANAEROBIC BOTTLE ONLY Blood Culture results may not be optimal due to an inadequate volume of blood received in culture bottles   Culture   Final    NO GROWTH 3 DAYS Performed at [facility], [address], [place], [place] [address]    Report Status PENDING  Incomplete  Blood Culture (routine x 2)     Status: None (Preliminary result)   Collection Time: 03/19/18  3:15 PM  Result Value Ref Range Status   Specimen Description BLOOD RIGHT HAND  Final   Special Requests   Final    BOTTLES DRAWN AEROBIC ONLY Blood Culture results may not be optimal due to an inadequate volume of blood received in culture bottles   Culture   Final    NO GROWTH 3 DAYS Performed at [facility], [address] [place], [place] [address]    Report Status PENDING  Incomplete  MRSA PCR Screening     Status: None   Collection Time: 03/19/18  8:08 PM  Result Value Ref Range Status   MRSA by PCR NEGATIVE NEGATIVE Final    Comment:        The GeneXpert MRSA Assay (FDA approved for NASAL specimens only), is one component of a comprehensive MRSA colonization surveillance program. It is not intended to diagnose MRSA infection nor to guide or monitor treatment for MRSA infections. Performed at [facility], [address], [place], [place] [address]    Studies/Results: No results found.  Medications:  Prior to Admission:  Medications Prior to Admission  Medication Sig Dispense Refill Last Dose  . Amino Acids-Protein Hydrolys (FEEDING SUPPLEMENT, PRO-STAT SUGAR FREE 64,) LIQD Place 30 mLs into feeding tube 2 (two) times daily. 900 mL 0 03/19/2018 at Unknown time  . apixaban (ELIQUIS) 5 MG TABS tablet Take 1 tablet (5 mg total) by mouth 2 (two) times daily. Ok to crush with oral food ( NOT PEG) 60 tablet 3 03/19/2018 at 8am  . cefTRIAXone (ROCEPHIN) 1 g injection Inject 1 g  into the muscle once. For infection   03/16/2018  . doxycycline (VIBRAMYCIN) 100 MG capsule Take 100 mg by mouth 2 (two) times daily.   03/19/2018 at Unknown time  . folic acid (FOLVITE) 1 MG tablet Take 1 tablet (1 mg total) by mouth daily.   03/19/2018 at Unknown time  . furosemide (LASIX) 10 mg/mL SOLN Inject 40 mg into the vein once. Intramuscularly one time for CHF for 1 day   03/16/2018  . gabapentin (NEURONTIN) 300 MG capsule Take 600 mg by mouth 2 (two) times daily.   03/19/2018 at Unknown time  . ipratropium-albuterol (DUONEB) 0.5-2.5 (3) MG/3ML SOLN Take 3 mLs by nebulization every 8 (eight) hours. For 2 days   03/17/2018  . levothyroxine (SYNTHROID, LEVOTHROID) 75 MCG tablet Take 1 tablet (75 mcg total) by mouth daily before breakfast.   03/19/2018 at Unknown time  . Melatonin 5 MG CAPS Take 10 capsules by mouth at bedtime.   03/18/2018 at Unknown time  . metoprolol tartrate (LOPRESSOR) 50 MG tablet Take 1 tablet (50 mg total) by mouth 2 (two) times daily.   03/19/2018 at 8am  . mirtazapine (REMERON) 15 MG tablet Take 1 tablet (15 mg total) by mouth at bedtime.  03/18/2018 at Unknown time  . Nutritional Supplements (FEEDING SUPPLEMENT, JEVITY 1.2 CAL,) LIQD Place 1,000 mLs into feeding tube continuous.  0 03/19/2018 at Unknown time  . omeprazole (PRILOSEC) 20 MG capsule Take 1 capsule (20 mg total) by mouth 2 (two) times daily before a meal.   03/19/2018 at Unknown time  . simvastatin (ZOCOR) 40 MG tablet Take 0.5 tablets (20 mg total) by mouth at bedtime. 30 tablet 12 03/18/2018 at Unknown time  . traMADol (ULTRAM) 50 MG tablet Take 50 mg by mouth every 6 (six) hours as needed.   03/17/2018  . Water For Irrigation, Sterile (FREE WATER) SOLN Place 200 mLs into feeding tube 4 (four) times daily.   03/19/2018 at Unknown time  . white petrolatum (VASELINE) GEL Apply 1 application topically as needed for lip care.  0 03/19/2018 at Unknown time  . albuterol (PROVENTIL HFA) 108 (90 Base) MCG/ACT inhaler Inhale  2 puffs into the lungs every 6 (six) hours as needed for wheezing or shortness of breath. 1 Inhaler 3 unknown  . diltiazem (CARDIZEM CD) 240 MG 24 hr capsule HOLD until seen by PCP 90 capsule 3   . furosemide (LASIX) 20 MG tablet HOLD until seen by PCP 90 tablet 3 03/10/2018  . lisinopril (PRINIVIL,ZESTRIL) 40 MG tablet HOLD until seen by PCP 90 tablet 3 unknown  . Maltodextrin-Xanthan Gum (RESOURCE THICKENUP CLEAR) POWD Take 240 g by mouth as needed.   unknown  . nitroGLYCERIN (NITROSTAT) 0.4 MG SL tablet Place 1 tablet (0.4 mg total) under the tongue every 5 (five) minutes as needed for chest pain. 30 tablet 9 unknown   Scheduled: . apixaban  5 mg Oral BID  . aspirin EC  81 mg Oral Daily  . budesonide (PULMICORT) nebulizer solution  0.5 mg Nebulization BID  . chlorhexidine  15 mL Mouth Rinse BID  . feeding supplement (PRO-STAT SUGAR FREE 64)  30 mL Per Tube BID  . folic acid  1 mg Per Tube Daily  . free water  200 mL Per Tube QID  . furosemide  40 mg Intravenous Q12H  . insulin aspart  0-9 Units Subcutaneous TID WC  . ipratropium-albuterol  3 mL Nebulization TID  . levothyroxine  75 mcg Per Tube QAC breakfast  . mouth rinse  15 mL Mouth Rinse q12n4p  . metoprolol tartrate  12.5 mg Per Tube BID  . mirtazapine  15 mg Per Tube QHS  . pantoprazole  40 mg Oral Daily  . simvastatin  20 mg Per Tube QHS  . sodium chloride flush  3 mL Intravenous Q12H   Continuous: . sodium chloride    . feeding supplement (JEVITY 1.2 CAL) 1,000 mL (03/20/18 2135)   WUJ:WJXBJY chloride, acetaminophen, ipratropium-albuterol, morphine injection, nitroGLYCERIN, ondansetron (ZOFRAN) IV, sodium chloride flush, white petrolatum  Assesment: He was admitted with acute on chronic hypoxic respiratory failure with acute on chronic diastolic heart failure.  He is now down 3.7 L and is doing better.  He is oxygenating well on nasal cannula.  No chest pain.  No other complaints.  He has COPD at baseline and that seems  pretty stable  He has recurrent thyroid cancer and has a feeding tube in place and he will continue with tube feedings.  He has paroxysmal atrial fib which is stable and he is currently in sinus rhythm Active Problems:   Essential hypertension, benign   COPD (chronic obstructive pulmonary disease) (HCC)   PAF (paroxysmal atrial fibrillation) (HCC)   Acute on chronic  diastolic CHF (congestive heart failure) (HCC)   Thyroid cancer (HCC)   CAD (coronary artery disease)   Hypothyroidism (acquired)   Acute on chronic respiratory failure with hypoxia (HCC)   Elevated troponin   Lactic acid acidosis   Pressure injury of skin    Plan: Continue treatment.  Renal function still looks okay on 40 mg of Lasix IV twice a day.    LOS: 3 days   Kebin Maye L 03/22/2018, 7:44 AM

## 2018-03-22 NOTE — Progress Notes (Signed)
Patient resting and doing well without BIPAP at this time. O2 sat 96% on 2 lpm.

## 2018-03-22 NOTE — Progress Notes (Signed)
Progress Note  Patient Name: John Sampson Date of Encounter: 03/22/2018  Primary Cardiologist: Dr. Satira Sark  Subjective   Breathing more easily.  No chest pain or palpitations.  No abdominal pain.  Inpatient Medications    Scheduled Meds: . apixaban  5 mg Oral BID  . aspirin EC  81 mg Oral Daily  . budesonide (PULMICORT) nebulizer solution  0.5 mg Nebulization BID  . chlorhexidine  15 mL Mouth Rinse BID  . feeding supplement (PRO-STAT SUGAR FREE 64)  30 mL Per Tube BID  . folic acid  1 mg Per Tube Daily  . free water  200 mL Per Tube QID  . furosemide  40 mg Intravenous Q12H  . insulin aspart  0-9 Units Subcutaneous TID WC  . ipratropium-albuterol  3 mL Nebulization TID  . levothyroxine  75 mcg Per Tube QAC breakfast  . mouth rinse  15 mL Mouth Rinse q12n4p  . metoprolol tartrate  12.5 mg Per Tube BID  . mirtazapine  15 mg Per Tube QHS  . pantoprazole  40 mg Oral Daily  . simvastatin  20 mg Per Tube QHS  . sodium chloride flush  3 mL Intravenous Q12H   Continuous Infusions: . sodium chloride    . feeding supplement (JEVITY 1.2 CAL) 1,000 mL (03/20/18 2135)   PRN Meds: sodium chloride, acetaminophen, ipratropium-albuterol, morphine injection, nitroGLYCERIN, ondansetron (ZOFRAN) IV, sodium chloride flush, white petrolatum   Vital Signs    Vitals:   03/22/18 0315 03/22/18 0330 03/22/18 0400 03/22/18 0500  BP: (!) 108/47 (!) 99/51 (!) 129/46   Pulse:  73 71   Resp: 15 (!) 21 20   Temp:   99.4 F (37.4 C)   TempSrc:   Oral   SpO2:  94% 95%   Weight:    186 lb 1.1 oz (84.4 kg)  Height:        Intake/Output Summary (Last 24 hours) at 03/22/2018 0810 Last data filed at 03/22/2018 0500 Gross per 24 hour  Intake 1503 ml  Output 2800 ml  Net -1297 ml   Filed Weights   03/20/18 0500 03/21/18 0500 03/22/18 0500  Weight: 190 lb 4.1 oz (86.3 kg) 190 lb 11.2 oz (86.5 kg) 186 lb 1.1 oz (84.4 kg)    Telemetry    Sinus rhythm.  Personally reviewed.  ECG      Tracing from 03/21/2018 shows sinus rhythm with counterclockwise rotation, LVH, and anterolateral ischemic changes.  Personally reviewed.  Physical Exam   GEN:  Chronically ill-appearing male.  No acute distress.   Neck: No JVD. Cardiac: RRR, 2/6 systolic murmur, no gallop.  Respiratory: Nonlabored.  Decreased breath sounds at bases. GI: Soft, bowel sounds present, PEG tube. MS: No edema; venous stasis, heels with protective pads. Neuro:  Nonfocal. Psych: Alert and oriented x 3. Normal affect.  Labs    Chemistry Recent Labs  Lab 03/19/18 1516 03/20/18 0344 03/21/18 0426 03/22/18 0356  NA 137 136 137 137  K 4.5 4.2 3.7 3.8  CL 98* 97* 95* 94*  CO2 28 27 28 31   GLUCOSE 114* 248* 108* 97  BUN 22* 26* 38* 43*  CREATININE 0.93 1.16 0.89 0.97  CALCIUM 9.0 8.4* 9.0 8.7*  PROT 6.1*  --   --   --   ALBUMIN 3.1*  --   --   --   AST 27  --   --   --   ALT 25  --   --   --  ALKPHOS 70  --   --   --   BILITOT 0.7  --   --   --   GFRNONAA >60 58* >60 >60  GFRAA >60 >60 >60 >60  ANIONGAP 11 12 14 12      Hematology Recent Labs  Lab 03/19/18 1516 03/21/18 0426  WBC 8.2 12.6*  RBC 3.48* 3.04*  HGB 9.5* 8.4*  HCT 32.1* 27.7*  MCV 92.2 91.1  MCH 27.3 27.6  MCHC 29.6* 30.3  RDW 19.2* 19.0*  PLT 329 339    Cardiac Enzymes Recent Labs  Lab 03/19/18 1516 03/19/18 2058 03/20/18 0344 03/20/18 0825  TROPONINI 0.29* 0.24* 0.19* 0.19*   No results for input(s): TROPIPOC in the last 168 hours.   BNP Recent Labs  Lab 03/19/18 1516  BNP 395.0*     Radiology    No results found.  Cardiac Studies   Echocardiogram 03/20/2018: Study Conclusions  - Left ventricle: The cavity size was normal. Wall thickness was   increased in a pattern of moderate LVH. Systolic function was   normal. The estimated ejection fraction was in the range of 60%   to 65%. Possible hypokinesis of the basalinferior myocardium.   Doppler parameters are consistent with abnormal left  ventricular   relaxation (grade 1 diastolic dysfunction). - Aortic valve: Moderately calcified annulus. Trileaflet; mildly   calcified leaflets. - Mitral valve: Mildly to moderately calcified annulus. Mildly   calcified leaflets . There was mild regurgitation. - Left atrium: The atrium was mildly dilated. - Right atrium: Central venous pressure (est): 3 mm Hg. - Atrial septum: No defect or patent foramen ovale was identified. - Tricuspid valve: There was trivial regurgitation. - Pulmonary arteries: Systolic pressure could not be accurately   estimated. - Pericardium, extracardiac: A prominent pericardial fat pad was   present.  Lexiscan Myoview 01/13/2018:  There was no ST segment deviation noted during stress.  Findings consistent with prior large inferior/inferolateral myocardial infarction.  This is a low risk study. There is no current myocardium at jeopardy.  The left ventricular ejection fraction is normal (55-65%).  Patient Profile     82 y.o. male with past medical history of CAD (s/p CABG in 2003, low-risk NST in 12/2017), PAF (on Eliquis), chronic diastolic CHF, recurrent thyroid cancer, HTN, HLD, COPD and carotid artery stenosis.  He is currently admitted with acute on chronic hypoxic respiratory failure and acute on chronic diastolic heart failure.  Assessment & Plan    1.  Acute on chronic diastolic heart failure.  Follow-up echocardiogram shows LVEF 60-65% with possible inferior basal hypokinesis and grade 1 diastolic dysfunction.  Net diuresis of approximately 3 L since admission, currently on Lasix 40 mg IV twice daily.  2.  Elevated troponin I, peak of 0.29 down to 0.19.  ECG does show anterolateral ischemic changes, but overall suspect demand ischemia at this point.  Myoview from February of this year showed a large region of inferior/inferolateral scar.  Plan is to manage medically.  3.  Paroxysmal atrial fibrillation.  Currently maintaining sinus rhythm.  On  Eliquis and Lopressor.  4.  Recurrent thyroid cancer.  5.  DNR status.  Continue medical management.  He is tolerating diuresis well, BUN 43 but creatinine stable at 0.97.  No change in present Lasix dose, reassess with BMET tomorrow.  Otherwise continue Eliquis, Lopressor, and Zocor.  Stop aspirin.  Signed, Rozann Lesches, MD  03/22/2018, 8:10 AM

## 2018-03-22 NOTE — Clinical Social Work Note (Signed)
Clinical Social Work Assessment  Patient Details  Name: John Sampson MRN: [id number] Date of Birth: 02/22/1936  Date of referral:  03/22/18               Reason for consult:  Discharge Planning                Permission sought to share information with:  Family Supports, Customer service manager Permission granted to share information::  Yes, Verbal Permission Granted  Name::     John Sampson (son), Social research officer, government::     Relationship::  son  Contact Information:  [facility]  Housing/Transportation Living arrangements for the past 2 months:  [place], [place] of Information:  Patient, Adult Children, Facility Patient Interpreter Needed:  None Criminal Activity/Legal Involvement Pertinent to Current Situation/Hospitalization:  No - Comment as needed Significant Relationships:  Adult Children, Other Family Members Lives with:  Self Do you feel safe going back to the place where you live?  Yes Need for family participation in patient care:  Yes (Comment)  Care giving concerns: Pt admitted from [facility].   Social Worker assessment / plan: Pt is an 82 year old male admitted from [facility]. Spoke with Debbie at [facility] to update. Pt has been at [facility] for short term rehab for about two weeks. [facility] will accept him back at dc.   Met with pt to assess. Pt is alert and oriented x4. He presents with flat affect and he appears fatigued. He appears to still not feel well. Pt indicates he plans to go back to [facility] but asks LCSW to call his son.  Spoke with pt's son, John Sampson, by phone. Per John Sampson, the plan is for pt to continue rehab at [facility] upon dc. Tommy aware that LCSW will follow and update when pt ready for dc.  Employment status:  Retired Forensic scientist:  Medicare PT Recommendations:  Not assessed at this time Information / Referral to community resources:     Patient/Family's Response to care: Pt and family accepting of LCSW  involvement.  Patient/Family's Understanding of and Emotional Response to Diagnosis, Current Treatment, and Prognosis: Pt and family appear to understand CSW role as well as pt's diagnosis and treatment recommendations. Pt presents as fatigued and with flat affect. He did not feel well enough to elaborate. Will follow.  Emotional Assessment Appearance:  Appears stated age Attitude/Demeanor/Rapport:  Lethargic Affect (typically observed):  Calm, Quiet, Flat Orientation:  Oriented to Self, Oriented to Place, Oriented to  Time, Oriented to Situation Alcohol / Substance use:  Not Applicable Psych involvement (Current and /or in the community):  No (Comment)  Discharge Needs  Concerns to be addressed:  Care Coordination, Discharge Planning Concerns Readmission within the last 30 days:  Yes Current discharge risk:  Physical Impairment Barriers to Discharge:  No Barriers Identified   Shade Flood, LCSW 03/22/2018, 10:33 AM

## 2018-03-22 NOTE — NC FL2 (Signed)
[place] LEVEL OF CARE SCREENING TOOL     IDENTIFICATION  Patient Name: John Sampson Birthdate: 02/12/1936 Sex: male Admission Date (Current Location): 03/19/2018  [facility] and [place] Number:  [employer] and Address:  [place] [address] [place]      Provider Number: [id number]  Attending Physician Name and Address:  Sinda Du, MD  Relative Name and Phone Number:  Dhiren Azimi [phone] son    Current Level of Care: Hospital Recommended Level of Care: [place] Prior Approval Number:    Date Approved/Denied:   PASRR Number: [id number]  Discharge Plan: SNF    Current Diagnoses: Patient Active Problem List   Diagnosis Date Noted  . Pressure injury of skin 03/22/2018  . Acute on chronic respiratory failure with hypoxia ([place]) 03/19/2018  . Elevated troponin 03/19/2018  . Lactic acid acidosis 03/19/2018  . Protein-calorie malnutrition, severe 02/17/2018  . Dysphagia 02/14/2018  . Hypothyroidism (acquired) 02/14/2018  . Abrasion of lower leg, left, initial encounter 02/14/2018  . Preoperative clearance 01/11/2018  . Bilateral carotid bruits 01/11/2018  . CAD (coronary artery disease) 01/10/2018  . Iron deficiency anemia due to chronic blood loss 08/24/2017  . GI bleeding 03/31/2017  . GI bleed 03/30/2017  . Thyroid cancer ([place]) 09/14/2016  . HCAP (healthcare-associated pneumonia) 08/24/2016  . Pneumonia 08/23/2016  . Acute on chronic diastolic CHF (congestive heart failure) ([place]) 07/24/2016  . Cellulitis of right arm 07/20/2016  . Cellulitis and abscess of hand 07/20/2016  . Venous stasis ulcers ([place]) 12/24/2015  . Chronic venous insufficiency 12/24/2015  . Atherosclerosis of extremity with ulceration ([place]) 12/24/2015  . CHF (congestive heart failure) ([place]) 12/24/2015  . Critical lower limb ischemia 12/24/2015  . Obesity 12/24/2015  . PAF (paroxysmal atrial fibrillation) ([place])  12/24/2015  . PAD (peripheral artery disease) ([place]) 06/07/2014  . COPD (chronic obstructive pulmonary disease) ([place]) 01/23/2013  . Anemia 08/03/2012  . Essential hypertension, benign 08/03/2012  . Mixed hyperlipidemia 08/03/2012  . Hx of CABG 08/03/2012    Orientation RESPIRATION BLADDER Height & Weight     Self, Time, Situation, Place  O2(see discharge summary) Incontinent Weight: 186 lb 1.1 oz (84.4 kg) Height:  5\' 9"  (175.3 cm)  BEHAVIORAL SYMPTOMS/MOOD NEUROLOGICAL BOWEL NUTRITION STATUS      Continent Diet(see discharge summary)  AMBULATORY STATUS COMMUNICATION OF NEEDS Skin   Limited Assist Verbally PU Stage and Appropriate Care PU Stage 1 Dressing: (see discharge summary)                     Personal Care Assistance Level of Assistance  Bathing Bathing Assistance: Limited assistance Feeding assistance: Independent Dressing Assistance: Limited assistance     Functional Limitations Info    Sight Info: Adequate Hearing Info: Adequate Speech Info: Adequate    SPECIAL CARE FACTORS FREQUENCY  PT (By licensed PT), OT (By licensed OT)     PT Frequency: 5x week OT Frequency: 5x week            Contractures Contractures Info: Not present    Additional Factors Info    Code Status Info: DNR Allergies Info: Morphine and Related           Current Medications (03/22/2018):  This is the current hospital active medication list Current Facility-Administered Medications  Medication Dose Route Frequency Provider Last Rate Last Dose  . 0.9 %  sodium chloride infusion  250 mL Intravenous PRN Barton Dubois, MD      .  acetaminophen (TYLENOL) tablet 650 mg  650 mg Per Tube Q4H PRN Manuella Ghazi, Pratik D, DO      . apixaban (ELIQUIS) tablet 5 mg  5 mg Oral BID Bernerd Pho M, PA-C   5 mg at 03/22/18 1005  . budesonide (PULMICORT) nebulizer solution 0.5 mg  0.5 mg Nebulization BID Barton Dubois, MD   0.5 mg at 03/22/18 0831  . chlorhexidine (PERIDEX) 0.12 % solution 15 mL   15 mL Mouth Rinse BID Sinda Du, MD   15 mL at 03/22/18 1005  . feeding supplement (JEVITY 1.2 CAL) liquid 1,000 mL  1,000 mL Per Tube Continuous Barton Dubois, MD 65 mL/hr at 03/20/18 2135 1,000 mL at 03/20/18 2135  . feeding supplement (PRO-STAT SUGAR FREE 64) liquid 30 mL  30 mL Per Tube BID Barton Dubois, MD   30 mL at 03/22/18 1005  . folic acid (FOLVITE) tablet 1 mg  1 mg Per Tube Daily Shah, Pratik D, DO   1 mg at 03/22/18 1005  . free water 200 mL  200 mL Per Tube QID Barton Dubois, MD   200 mL at 03/22/18 1008  . furosemide (LASIX) injection 40 mg  40 mg Intravenous Q12H Erma Heritage, PA-C   40 mg at 03/22/18 0816  . insulin aspart (novoLOG) injection 0-9 Units  0-9 Units Subcutaneous TID WC Lucia Gaskins, MD   1 Units at 03/21/18 0850  . ipratropium-albuterol (DUONEB) 0.5-2.5 (3) MG/3ML nebulizer solution 3 mL  3 mL Nebulization Q4H PRN Barton Dubois, MD   3 mL at 03/21/18 1953  . ipratropium-albuterol (DUONEB) 0.5-2.5 (3) MG/3ML nebulizer solution 3 mL  3 mL Nebulization TID Sinda Du, MD   3 mL at 03/22/18 0826  . levothyroxine (SYNTHROID, LEVOTHROID) tablet 75 mcg  75 mcg Per Tube QAC breakfast Heath Lark D, DO   75 mcg at 03/22/18 0816  . MEDLINE mouth rinse  15 mL Mouth Rinse q12n4p Sinda Du, MD   15 mL at 03/21/18 1158  . metoprolol tartrate (LOPRESSOR) tablet 12.5 mg  12.5 mg Per Tube BID Manuella Ghazi, Pratik D, DO   12.5 mg at 03/22/18 1006  . mirtazapine (REMERON) tablet 15 mg  15 mg Per Tube QHS Manuella Ghazi, Pratik D, DO   15 mg at 03/21/18 2100  . morphine 2 MG/ML injection 2 mg  2 mg Intravenous Q4H PRN Gardiner Barefoot, NP   2 mg at 03/22/18 0332  . nitroGLYCERIN (NITROSTAT) SL tablet 0.4 mg  0.4 mg Sublingual Q5 min PRN Barton Dubois, MD      . ondansetron [facility]) injection 4 mg  4 mg Intravenous Q6H PRN Barton Dubois, MD      . pantoprazole (PROTONIX) EC tablet 40 mg  40 mg Oral Daily Barton Dubois, MD   40 mg at 03/21/18 0906  . simvastatin  (ZOCOR) tablet 20 mg  20 mg Per Tube QHS Lenis Noon, RPH   20 mg at 03/21/18 2100  . sodium chloride flush (NS) 0.9 % injection 3 mL  3 mL Intravenous Q12H Barton Dubois, MD   3 mL at 03/22/18 1009  . sodium chloride flush (NS) 0.9 % injection 3 mL  3 mL Intravenous PRN Barton Dubois, MD      . white petrolatum (VASELINE) gel 1 application  1 application Topical PRN Barton Dubois, MD         Discharge Medications: Please see discharge summary for a list of discharge medications.  Relevant Imaging Results:  Relevant Lab Results:  Additional Information    Shade Flood, LCSW

## 2018-03-23 LAB — GLUCOSE, CAPILLARY
GLUCOSE-CAPILLARY: 137 mg/dL — AB (ref 65–99)
GLUCOSE-CAPILLARY: 117 mg/dL — AB (ref 65–99)
Glucose-Capillary: 118 mg/dL — ABNORMAL HIGH (ref 65–99)
Glucose-Capillary: 103 mg/dL — ABNORMAL HIGH (ref 65–99)

## 2018-03-23 LAB — BASIC METABOLIC PANEL
Anion gap: 12 (ref 5–15)
BUN: 45 mg/dL — ABNORMAL HIGH (ref 6–20)
CHLORIDE: 93 mmol/L — AB (ref 101–111)
CO2: 32 mmol/L (ref 22–32)
CREATININE: 0.9 mg/dL (ref 0.61–1.24)
Calcium: 9 mg/dL (ref 8.9–10.3)
GFR calc non Af Amer: >60 mL/min (ref >60)
Glucose, Bld: 108 mg/dL — ABNORMAL HIGH (ref 65–99)
POTASSIUM: 4 mmol/L (ref 3.5–5.1)
SODIUM: 137 mmol/L (ref 135–145)

## 2018-03-23 MED ORDER — JUVEN PO PACK
1.0000 | PACK | Freq: Two times a day (BID) | ORAL | Status: DC
  Administered 2018-03-23 (×2): 1 via ORAL
  Filled 2018-03-23 (×2): qty 1

## 2018-03-23 MED ORDER — ADULT MULTIVITAMIN LIQUID CH
5.0000 mL | Freq: Every day | ORAL | Status: DC
  Filled 2018-03-23 (×2): qty 15

## 2018-03-23 MED ORDER — ADULT MULTIVITAMIN LIQUID CH
15.0000 mL | Freq: Every day | ORAL | Status: DC
  Administered 2018-03-23 – 2018-03-28 (×6): 15 mL
  Filled 2018-03-23 (×7): qty 15

## 2018-03-23 MED ORDER — GABAPENTIN 250 MG/5ML PO SOLN
300.0000 mg | Freq: Every day | ORAL | Status: DC
  Administered 2018-03-24 – 2018-03-27 (×4): 300 mg
  Filled 2018-03-23 (×6): qty 6

## 2018-03-23 NOTE — Progress Notes (Addendum)
Progress Note  Patient Name: John Sampson Date of Encounter: 03/23/2018  Primary Cardiologist: Rozann Lesches, MD   Subjective   Breathing overall improved. Denies any chest pain or palpitations. Able to sleep with [place] overnight and did not have to use BiPAP. Planning to transition to telemetry later today.   Inpatient Medications    Scheduled Meds: . apixaban  5 mg Oral BID  . budesonide (PULMICORT) nebulizer solution  0.5 mg Nebulization BID  . chlorhexidine  15 mL Mouth Rinse BID  . feeding supplement (PRO-STAT SUGAR FREE 64)  30 mL Per Tube BID  . folic acid  1 mg Per Tube Daily  . free water  200 mL Per Tube QID  . furosemide  40 mg Intravenous Q12H  . gabapentin  300 mg Per Tube QHS  . insulin aspart  0-9 Units Subcutaneous TID WC  . ipratropium-albuterol  3 mL Nebulization TID  . levothyroxine  75 mcg Per Tube QAC breakfast  . mouth rinse  15 mL Mouth Rinse q12n4p  . metoprolol tartrate  12.5 mg Per Tube BID  . mirtazapine  15 mg Per Tube QHS  . pantoprazole  40 mg Oral Daily  . simvastatin  20 mg Per Tube QHS  . sodium chloride flush  3 mL Intravenous Q12H   Continuous Infusions: . sodium chloride    . feeding supplement (JEVITY 1.2 CAL) 1,000 mL (03/23/18 0245)   PRN Meds: sodium chloride, acetaminophen, ipratropium-albuterol, morphine injection, nitroGLYCERIN, ondansetron (ZOFRAN) IV, sodium chloride flush, white petrolatum   Vital Signs    Vitals:   03/23/18 0300 03/23/18 0400 03/23/18 0500 03/23/18 0600  BP: (!) 105/48 (!) 117/47 (!) 111/45 (!) 116/49  Pulse: 63 62 61 64  Resp: 18 (!) 21 15 17   Temp:  97.6 F (36.4 C)    TempSrc:  Oral    SpO2: 94% 95% 96% 93%  Weight:   186 lb 8.2 oz (84.6 kg)   Height:        Intake/Output Summary (Last 24 hours) at 03/23/2018 0756 Last data filed at 03/23/2018 0500 Gross per 24 hour  Intake 2810 ml  Output 3250 ml  Net -440 ml   Filed Weights   03/21/18 0500 03/22/18 0500 03/23/18 0500  Weight: 190 lb  11.2 oz (86.5 kg) 186 lb 1.1 oz (84.4 kg) 186 lb 8.2 oz (84.6 kg)    Telemetry    NSR, HR in 60's to 70's with occasional PVC's. 5 beats NSVT.  - Personally Reviewed  ECG    No new tracings.   Physical Exam   General: Well developed, elderly Caucasian male appearing in no acute distress. Head: Normocephalic, atraumatic.  Neck: Supple without bruits, no JVD. Lungs:  Resp regular and unlabored, mild rales along bases bilaterally. Heart: RRR, S1, S2, no S3, S4, or murmur; no rub. Abdomen: Soft, non-tender, non-distended with normoactive bowel sounds. No hepatomegaly. No rebound/guarding. No obvious abdominal masses. PEG tube in place. Extremities: No clubbing, cyanosis, or edema. Distal pedal pulses are 2+ bilaterally. Neuro: Alert and oriented X 3. Moves all extremities spontaneously. Psych: Normal affect.  Labs    Chemistry Recent Labs  Lab 03/19/18 1516  03/21/18 0426 03/22/18 0356 03/23/18 0412  NA 137   < > 137 137 137  K 4.5   < > 3.7 3.8 4.0  CL 98*   < > 95* 94* 93*  CO2 28   < > 28 31 32  GLUCOSE 114*   < > 108*  97 108*  BUN 22*   < > 38* 43* 45*  CREATININE 0.93   < > 0.89 0.97 0.90  CALCIUM 9.0   < > 9.0 8.7* 9.0  PROT 6.1*  --   --   --   --   ALBUMIN 3.1*  --   --   --   --   AST 27  --   --   --   --   ALT 25  --   --   --   --   ALKPHOS 70  --   --   --   --   BILITOT 0.7  --   --   --   --   GFRNONAA >60   < > >60 >60 >60  GFRAA >60   < > >60 >60 >60  ANIONGAP 11   < > 14 12 12    < > = values in this interval not displayed.     Hematology Recent Labs  Lab 03/19/18 1516 03/21/18 0426  WBC 8.2 12.6*  RBC 3.48* 3.04*  HGB 9.5* 8.4*  HCT 32.1* 27.7*  MCV 92.2 91.1  MCH 27.3 27.6  MCHC 29.6* 30.3  RDW 19.2* 19.0*  PLT 329 339    Cardiac Enzymes Recent Labs  Lab 03/19/18 1516 03/19/18 2058 03/20/18 0344 03/20/18 0825  TROPONINI 0.29* 0.24* 0.19* 0.19*   No results for input(s): TROPIPOC in the last 168 hours.   BNP Recent Labs    Lab 03/19/18 1516  BNP 395.0*     DDimer No results for input(s): DDIMER in the last 168 hours.   Radiology    No results found.  Cardiac Studies   Echocardiogram: 03/20/2018 Study Conclusions  - Left ventricle: The cavity size was normal. Wall thickness was   increased in a pattern of moderate LVH. Systolic function was   normal. The estimated ejection fraction was in the range of 60%   to 65%. Possible hypokinesis of the basalinferior myocardium.   Doppler parameters are consistent with abnormal left ventricular   relaxation (grade 1 diastolic dysfunction). - Aortic valve: Moderately calcified annulus. Trileaflet; mildly   calcified leaflets. - Mitral valve: Mildly to moderately calcified annulus. Mildly   calcified leaflets . There was mild regurgitation. - Left atrium: The atrium was mildly dilated. - Right atrium: Central venous pressure (est): 3 mm Hg. - Atrial septum: No defect or patent foramen ovale was identified. - Tricuspid valve: There was trivial regurgitation. - Pulmonary arteries: Systolic pressure could not be accurately   estimated. - Pericardium, extracardiac: A prominent pericardial fat pad was   present.  Patient Profile     82 y.o. male w/ PMH of CAD (s/p CABG in 2003, low-risk NST in 12/2017), PAF (on Eliquis), chronic diastolic CHF, recurrent thyroid cancer, HTN, HLD, COPD and carotid artery stenosis who presented to [facility] ED on 03/19/2018 for worsening dyspnea and hypoxia. Admitted for acute hypoxic respiratory failure and Cardiology has been consulted to assist with CHF management.   Assessment & Plan    1. Acute on Chronic Diastolic CHF - presented with worsening dyspnea on exertion and orthopnea, found to have an acute CHF exacerbation with BNP elevated at 395 on admission and CXR consistent with CHF. Repeat echo shows a preserved EF of 60-65% with Grade 1 DD. - IV Lasix was further titrated to 40mg  BID and he has responded well with an  overall net output of -4.1L this admission. Weight has declined by 4 lbs. Creatinine  remains stable at 0.90. Would continue with IV Lasix today. Possible transition to PO tomorrow pending clinical status and kidney function.   2. CAD/ Elevated Troponin  - s/p CABG in 2003 with low-risk NST in 12/2017. Cyclic troponin values have been flat this admission and peaked at 0.29. Repeat echo shows a preserved EF of 60 to 65% with possible hypokinesis of the basal inferior myocardium and grade 1 diastolic dysfunction. - felt to be secondary to demand ischemia in the setting of his CHF exacerbation. No plans for further ischemic evaluation at this time.  - continue BB and statin therapy. No ASA given the need for anticoagulation.   3. PAF - maintaining NSR this admission. Has been restarted on Eliquis for anticoagulation. - did have brief runs of NSVT overnight. K+ 4.0. Will check Mg. Continue BB therapy.   4. Recurrent thyroid cancer - recently diagnosed during his last admission for persistent dysphagia. PEG tube in place.  - outpatient follow-up with ENT has been recommended.   For questions or updates, please contact [place] Please consult [email or web address] for contact info under Cardiology/STEMI.   Arna Medici , PA-C 7:56 AM 03/23/2018 Pager: [phone]   Attending note:  Patient seen and examined.  Reviewed case with Ms. Ahmed Prima PA-C.  Mr. Robbins continues to diuresis on IV Lasix with net output of just over 4 L so far.  In the sinus rhythm by telemetry with occasional PVCs and short burst of NSVT.  Systolic blood pressures are in the 100-120 range.  Heart rate in the 60s.  He has been more comfortable, has not had to use BiPAP recently and is planned for transition to telemetry today.  Lab work shows creatinine 0.9, BUN 45, potassium 4.9.  Continue Eliquis, Lopressor, and Zocor.  Continue IV Lasix another 24 hours with likely transition to oral regimen tomorrow.   Replete electrolytes as needed.  As noted previously, we do not plan further ischemic testing at this point.  Mild elevation in troponin I is most likely related to demand ischemia.  Satira Sark, M.D., F.A.C.C.

## 2018-03-23 NOTE — Progress Notes (Signed)
Subjective: He states he feels better with his breathing.  He said he did not rest very well last night because of being in the intensive care unit environment.  His neuropathy in his feet is giving him trouble.  He has no other new complaints.  He did not use BiPAP last night  Objective: Vital signs in last 24 hours: Temp:  [97.6 F (36.4 C)-98.2 F (36.8 C)] 97.6 F (36.4 C) (04/25 0400) Pulse Rate:  [57-71] 64 (04/25 0600) Resp:  [15-21] 17 (04/25 0600) BP: (91-137)/(41-72) 116/49 (04/25 0600) SpO2:  [89 %-98 %] 93 % (04/25 0600) Weight:  [84.6 kg (186 lb 8.2 oz)] 84.6 kg (186 lb 8.2 oz) (04/25 0500) Weight change: 0.2 kg (7.1 oz) Last BM Date: 03/18/18  Intake/Output from previous day: 04/24 0701 - 04/25 0700 In: 2810 [NG/GT:2810] Out: 3250 [Urine:3250]  PHYSICAL EXAM General appearance: alert, cooperative and mild distress Resp: rhonchi bilaterally Cardio: regular rate and rhythm, S1, S2 normal, no murmur, click, rub or gallop GI: soft, non-tender; bowel sounds normal; no masses,  no organomegaly Extremities: extremities normal, atraumatic, no cyanosis or edema  Lab Results:  Results for orders placed or performed during the hospital encounter of 03/19/18 (from the past 48 hour(s))  Glucose, capillary     Status: Abnormal   Collection Time: 03/21/18  7:49 AM  Result Value Ref Range   Glucose-Capillary 125 (H) 65 - 99 mg/dL  Glucose, capillary     Status: Abnormal   Collection Time: 03/21/18 11:16 AM  Result Value Ref Range   Glucose-Capillary 117 (H) 65 - 99 mg/dL  Glucose, capillary     Status: Abnormal   Collection Time: 03/21/18  4:22 PM  Result Value Ref Range   Glucose-Capillary 118 (H) 65 - 99 mg/dL  Glucose, capillary     Status: Abnormal   Collection Time: 03/21/18  9:23 PM  Result Value Ref Range   Glucose-Capillary 103 (H) 65 - 99 mg/dL   Comment 1 Notify RN    Comment 2 Document in Chart   Basic metabolic panel     Status: Abnormal   Collection Time:  03/22/18  3:56 AM  Result Value Ref Range   Sodium 137 135 - 145 mmol/L   Potassium 3.8 3.5 - 5.1 mmol/L   Chloride 94 (L) 101 - 111 mmol/L   CO2 31 22 - 32 mmol/L   Glucose, Bld 97 65 - 99 mg/dL   BUN 43 (H) 6 - 20 mg/dL   Creatinine, Ser 0.97 0.61 - 1.24 mg/dL   Calcium 8.7 (L) 8.9 - 10.3 mg/dL   GFR calc non Af Amer >60 >60 mL/min   GFR calc Af Amer >60 >60 mL/min    Comment: (NOTE) The eGFR has been calculated using the CKD EPI equation. This calculation has not been validated in all clinical situations. eGFR's persistently <60 mL/min signify possible Chronic Kidney Disease.    Anion gap 12 5 - 15    Comment: Performed at [facility], [address], [place], [place] [address]  Glucose, capillary     Status: Abnormal   Collection Time: 03/22/18  7:48 AM  Result Value Ref Range   Glucose-Capillary 118 (H) 65 - 99 mg/dL  Glucose, capillary     Status: Abnormal   Collection Time: 03/22/18 11:09 AM  Result Value Ref Range   Glucose-Capillary 115 (H) 65 - 99 mg/dL  Glucose, capillary     Status: Abnormal   Collection Time: 03/22/18  4:46 PM  Result Value Ref Range   Glucose-Capillary 107 (H) 65 - 99 mg/dL   Comment 1 Notify RN    Comment 2 Document in Chart   Glucose, capillary     Status: Abnormal   Collection Time: 03/22/18  9:35 PM  Result Value Ref Range   Glucose-Capillary 109 (H) 65 - 99 mg/dL  Basic metabolic panel     Status: Abnormal   Collection Time: 03/23/18  4:12 AM  Result Value Ref Range   Sodium 137 135 - 145 mmol/L   Potassium 4.0 3.5 - 5.1 mmol/L   Chloride 93 (L) 101 - 111 mmol/L   CO2 32 22 - 32 mmol/L   Glucose, Bld 108 (H) 65 - 99 mg/dL   BUN 45 (H) 6 - 20 mg/dL   Creatinine, Ser 0.90 0.61 - 1.24 mg/dL   Calcium 9.0 8.9 - 10.3 mg/dL   GFR calc non Af Amer >60 >60 mL/min   GFR calc Af Amer >60 >60 mL/min    Comment: (NOTE) The eGFR has been calculated using the CKD EPI equation. This calculation has not been validated in all clinical  situations. eGFR's persistently <60 mL/min signify possible Chronic Kidney Disease.    Anion gap 12 5 - 15    Comment: Performed at [facility], [address], [place], [place] [address]    ABGS No results for input(s): PHART, PO2ART, TCO2, HCO3 in the last 72 hours.  Invalid input(s): PCO2 CULTURES Recent Results (from the past 240 hour(s))  Blood Culture (routine x 2)     Status: None (Preliminary result)   Collection Time: 03/19/18  3:06 PM  Result Value Ref Range Status   Specimen Description BLOOD RIGHT FOREARM  Final   Special Requests   Final    ANAEROBIC BOTTLE ONLY Blood Culture results may not be optimal due to an inadequate volume of blood received in culture bottles   Culture   Final    NO GROWTH 4 DAYS Performed at [facility], [address], [place], [place] [address]    Report Status PENDING  Incomplete  Blood Culture (routine x 2)     Status: None (Preliminary result)   Collection Time: 03/19/18  3:15 PM  Result Value Ref Range Status   Specimen Description BLOOD RIGHT HAND  Final   Special Requests   Final    BOTTLES DRAWN AEROBIC ONLY Blood Culture results may not be optimal due to an inadequate volume of blood received in culture bottles   Culture   Final    NO GROWTH 4 DAYS Performed at [facility], [address] [place], [place] [address]    Report Status PENDING  Incomplete  MRSA PCR Screening     Status: None   Collection Time: 03/19/18  8:08 PM  Result Value Ref Range Status   MRSA by PCR NEGATIVE NEGATIVE Final    Comment:        The GeneXpert MRSA Assay (FDA approved for NASAL specimens only), is one component of a comprehensive MRSA colonization surveillance program. It is not intended to diagnose MRSA infection nor to guide or monitor treatment for MRSA infections. Performed at [facility], [address], [place],  [address]    Studies/Results: No results found.  Medications:  Prior to Admission:  Medications Prior to  Admission  Medication Sig Dispense Refill Last Dose  . Amino Acids-Protein Hydrolys (FEEDING SUPPLEMENT, PRO-STAT SUGAR FREE 64,) LIQD Place 30 mLs into feeding tube 2 (two) times daily. 900 mL  0 03/19/2018 at Unknown time  . apixaban (ELIQUIS) 5 MG TABS tablet Take 1 tablet (5 mg total) by mouth 2 (two) times daily. Ok to crush with oral food ( NOT PEG) 60 tablet 3 03/19/2018 at 8am  . cefTRIAXone (ROCEPHIN) 1 g injection Inject 1 g into the muscle once. For infection   03/16/2018  . doxycycline (VIBRAMYCIN) 100 MG capsule Take 100 mg by mouth 2 (two) times daily.   03/19/2018 at Unknown time  . folic acid (FOLVITE) 1 MG tablet Take 1 tablet (1 mg total) by mouth daily.   03/19/2018 at Unknown time  . furosemide (LASIX) 10 mg/mL SOLN Inject 40 mg into the vein once. Intramuscularly one time for CHF for 1 day   03/16/2018  . gabapentin (NEURONTIN) 300 MG capsule Take 600 mg by mouth 2 (two) times daily.   03/19/2018 at Unknown time  . ipratropium-albuterol (DUONEB) 0.5-2.5 (3) MG/3ML SOLN Take 3 mLs by nebulization every 8 (eight) hours. For 2 days   03/17/2018  . levothyroxine (SYNTHROID, LEVOTHROID) 75 MCG tablet Take 1 tablet (75 mcg total) by mouth daily before breakfast.   03/19/2018 at Unknown time  . Melatonin 5 MG CAPS Take 10 capsules by mouth at bedtime.   03/18/2018 at Unknown time  . metoprolol tartrate (LOPRESSOR) 50 MG tablet Take 1 tablet (50 mg total) by mouth 2 (two) times daily.   03/19/2018 at 8am  . mirtazapine (REMERON) 15 MG tablet Take 1 tablet (15 mg total) by mouth at bedtime.   03/18/2018 at Unknown time  . Nutritional Supplements (FEEDING SUPPLEMENT, JEVITY 1.2 CAL,) LIQD Place 1,000 mLs into feeding tube continuous.  0 03/19/2018 at Unknown time  . omeprazole (PRILOSEC) 20 MG capsule Take 1 capsule (20 mg total) by mouth 2 (two) times daily before a meal.   03/19/2018 at Unknown time  . simvastatin (ZOCOR) 40 MG tablet Take 0.5 tablets (20 mg total) by mouth at bedtime. 30 tablet 12  03/18/2018 at Unknown time  . traMADol (ULTRAM) 50 MG tablet Take 50 mg by mouth every 6 (six) hours as needed.   03/17/2018  . Water For Irrigation, Sterile (FREE WATER) SOLN Place 200 mLs into feeding tube 4 (four) times daily.   03/19/2018 at Unknown time  . white petrolatum (VASELINE) GEL Apply 1 application topically as needed for lip care.  0 03/19/2018 at Unknown time  . albuterol (PROVENTIL HFA) 108 (90 Base) MCG/ACT inhaler Inhale 2 puffs into the lungs every 6 (six) hours as needed for wheezing or shortness of breath. 1 Inhaler 3 unknown  . diltiazem (CARDIZEM CD) 240 MG 24 hr capsule HOLD until seen by PCP 90 capsule 3   . furosemide (LASIX) 20 MG tablet HOLD until seen by PCP 90 tablet 3 03/10/2018  . lisinopril (PRINIVIL,ZESTRIL) 40 MG tablet HOLD until seen by PCP 90 tablet 3 unknown  . Maltodextrin-Xanthan Gum (RESOURCE THICKENUP CLEAR) POWD Take 240 g by mouth as needed.   unknown  . nitroGLYCERIN (NITROSTAT) 0.4 MG SL tablet Place 1 tablet (0.4 mg total) under the tongue every 5 (five) minutes as needed for chest pain. 30 tablet 9 unknown   Scheduled: . apixaban  5 mg Oral BID  . budesonide (PULMICORT) nebulizer solution  0.5 mg Nebulization BID  . chlorhexidine  15 mL Mouth Rinse BID  . feeding supplement (PRO-STAT SUGAR FREE 64)  30 mL Per Tube BID  . folic acid  1 mg Per Tube Daily  . free water  200  mL Per Tube QID  . furosemide  40 mg Intravenous Q12H  . insulin aspart  0-9 Units Subcutaneous TID WC  . ipratropium-albuterol  3 mL Nebulization TID  . levothyroxine  75 mcg Per Tube QAC breakfast  . mouth rinse  15 mL Mouth Rinse q12n4p  . metoprolol tartrate  12.5 mg Per Tube BID  . mirtazapine  15 mg Per Tube QHS  . pantoprazole  40 mg Oral Daily  . simvastatin  20 mg Per Tube QHS  . sodium chloride flush  3 mL Intravenous Q12H   Continuous: . sodium chloride    . feeding supplement (JEVITY 1.2 CAL) 1,000 mL (03/23/18 0245)   GFR:EVQWQV chloride, acetaminophen,  ipratropium-albuterol, morphine injection, nitroGLYCERIN, ondansetron (ZOFRAN) IV, sodium chloride flush, white petrolatum  Assesment: He has come in with acute on chronic diastolic heart failure.  His troponin has been elevated and this is felt to be secondary to demand ischemia.  He had recent ischemic work-up that was low risk and he would be managed medically.  He has now diuresed over 4 L and his renal function is stable  He has COPD at baseline and that is stable his breathing is improving  He has recurrent thyroid cancer has a feeding tube in place and he was sent to rehab to try to get stronger to see if he could undergo treatment.  He has paroxysmal atrial fib but is in sinus rhythm now Active Problems:   Essential hypertension, benign   COPD (chronic obstructive pulmonary disease) (HCC)   PAF (paroxysmal atrial fibrillation) (HCC)   Acute on chronic diastolic CHF (congestive heart failure) (HCC)   Thyroid cancer (HCC)   CAD (coronary artery disease)   Hypothyroidism (acquired)   Acute on chronic respiratory failure with hypoxia (HCC)   Elevated troponin   Lactic acid acidosis   Pressure injury of skin    Plan: Transfer out of stepdown today    LOS: 4 days   John Sampson L 03/23/2018, 7:29 AM

## 2018-03-23 NOTE — Progress Notes (Signed)
Patient had a 5 beat run of Vtach B/P 101/52,HR 68,no c/o pain or discomfort noted. Will continue to monitor patient.

## 2018-03-23 NOTE — Progress Notes (Addendum)
Nutrition Follow-up    INTERVENTION:   Add JUVEN BID via PEG  Add MVI 5 ml via PEG   Jevity 1.2 @ 37ml/hr via PEG   30 ml Prostat BID.     200 ml free water flush QID  Tube feeding regimen provides 2072 kcal (100% of needs), 117 grams of protein, and 1264 ml of H2O. (Total free water: 2064 ml)  NO BM since 4/20??   NUTRITION DIAGNOSIS:   Swallowing difficulty related to inability to eat, dysphagia, cancer and cancer related treatments(Per ST asssessments including MBS- pt was on D1 NECTAR last hospitalization- but not eating but 0-10%) as evidenced by NPO status(ST evaluations).  GOAL:  Patient will meet greater than or equal to 90% of their needs  MONITOR:  TF tolerance, Weight trends, Labs, Skin  REASON FOR ASSESSMENT:   Malnutrition Screening Tool    ASSESSMENT:  Patient  is  An 82 yo male with swallow difficulty. 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 PEG placed on 3/29. He presents to [facility] with acute respiratory failure and acute on chronic HF. Diuresing and progressing to the point of being transferred to telemetry today. Weight is down 2 lb from initial assessment- considered desirable.   Intake/Output Summary (Last 24 hours) at 03/23/2018 1029 Last data filed at 03/23/2018 0500 Gross per 24 hour  Intake 1440 ml  Output 3250 ml  Net -1810 ml    Patient is NPO and tolerating  Jevity 1.2@65ml /hr viaPEG with 1ml Prostat BID and 200 ml free water flush QID. He does have multiple areas of skin compromise. Will add JUVEN to promote healing in these areas.  Nutrition Support Regimen provides 2072 kcals, 117 grams protein and 2059 ml free water daily.  Meeting 100% of his estimated needs.    Patient was diagnosed with severe malnutrition (acute) during last months hospitalization but since PEG was placed he has been meeting daily needs and nutrition status is improved.   RD note 3/30 [facility]: 3/24- s/p MBSS- advanced to  dysphagia 1 diet with honey thick liquids 3/27- s/p Repeat MBS- diet advanced to D1/NTL 3/28 CT shows extensive recurrence of thyroid cancer adjacent to trachea as well as into anterior esophageal wall and probable lung mets.   Labs: BMP Latest Ref Rng & Units 03/23/2018 03/22/2018 03/21/2018  Glucose 65 - 99 mg/dL 108(H) 97 108(H)  BUN 6 - 20 mg/dL 45(H) 43(H) 38(H)  Creatinine 0.61 - 1.24 mg/dL 0.90 0.97 0.89  Sodium 135 - 145 mmol/L 137 137 137  Potassium 3.5 - 5.1 mmol/L 4.0 3.8 3.7  Chloride 101 - 111 mmol/L 93(L) 94(L) 95(L)  CO2 22 - 32 mmol/L 32 31 28  Calcium 8.9 - 10.3 mg/dL 9.0 8.7(L) 9.0    Meds: . apixaban  5 mg Oral BID  . budesonide (PULMICORT) nebulizer solution  0.5 mg Nebulization BID  . chlorhexidine  15 mL Mouth Rinse BID  . feeding supplement (PRO-STAT SUGAR FREE 64)  30 mL Per Tube BID  . folic acid  1 mg Per Tube Daily  . free water  200 mL Per Tube QID  . furosemide  40 mg Intravenous Q12H  . gabapentin  300 mg Per Tube QHS  . insulin aspart  0-9 Units Subcutaneous TID WC  . ipratropium-albuterol  3 mL Nebulization TID  . levothyroxine  75 mcg Per Tube QAC breakfast  . mouth rinse  15 mL Mouth Rinse q12n4p  . metoprolol tartrate  12.5 mg Per Tube BID  .  mirtazapine  15 mg Per Tube QHS  . pantoprazole  40 mg Oral Daily  . simvastatin  20 mg Per Tube QHS  . sodium chloride flush  3 mL Intravenous Q12H   Past Medical History:  Diagnosis Date  . Atrial flutter ([place])    on Eliquis  . AV malformation of GI tract   . COPD (chronic obstructive pulmonary disease) ([place]) 3.12.14   2D Echo, EF 50-55%  . Coronary atherosclerosis of native coronary artery    Multivessel status post CABG  . Gastric ulcer    Small - nonbleeding  . GERD (gastroesophageal reflux disease)   . Headache(784.0)   . Hypothyroidism   . Iron deficiency anemia    Negative Givens capsule study   . Myocardial infarction ([place])   . PAD (peripheral artery disease) (HCC)    Moderate bilateral  SFA disease at angiography 01/2013  . Pituitary macroadenoma ([place])   . Thyroid cancer ([place])   . Vocal cord paralysis    left    Diet Order:  Diet NPO time specified  EDUCATION NEEDS: .none   Skin:  Updated 4/21 open area to sacrum 0.5 by 0.5 cm 4/23 -DTI left heel 4/23 Unstagable to left lower leg  Last BM:  4/20 type 4  Height:   Ht Readings from Last 1 Encounters:  03/19/18 5\' 9"  (1.753 m)    Weight:   Wt Readings from Last 1 Encounters:  03/23/18 186 lb 8.2 oz (84.6 kg)  02/25/18 pt wt 188.4 lb   Ideal Body Weight:   73 kg  BMI:  Body mass index is 27.54 kg/m.  Estimated Nutritional Needs:   Kcal:   1950-2150 (23-25 kcal/kg)  Protein:   102-120 gr (1.2-1.4 g/kg/bw)  Fluid:   2.0-2.2 liters daily (91ml/kcal)   Colman Cater MS,RD,CSG,LDN Office: [phone] Pager: [phone]

## 2018-03-24 LAB — CULTURE, BLOOD (ROUTINE X 2)
CULTURE: NO GROWTH
CULTURE: NO GROWTH

## 2018-03-24 LAB — BASIC METABOLIC PANEL
Anion gap: 14 (ref 5–15)
BUN: 59 mg/dL — ABNORMAL HIGH (ref 6–20)
CHLORIDE: 89 mmol/L — AB (ref 101–111)
CO2: 31 mmol/L (ref 22–32)
Calcium: 9.5 mg/dL (ref 8.9–10.3)
Creatinine, Ser: 1.05 mg/dL (ref 0.61–1.24)
GFR calc Af Amer: >60 mL/min (ref >60)
GLUCOSE: 112 mg/dL — AB (ref 65–99)
POTASSIUM: 3.7 mmol/L (ref 3.5–5.1)
Sodium: 134 mmol/L — ABNORMAL LOW (ref 135–145)

## 2018-03-24 LAB — GLUCOSE, CAPILLARY
GLUCOSE-CAPILLARY: 130 mg/dL — AB (ref 65–99)
GLUCOSE-CAPILLARY: 80 mg/dL (ref 65–99)
Glucose-Capillary: 106 mg/dL — ABNORMAL HIGH (ref 65–99)

## 2018-03-24 LAB — MAGNESIUM: Magnesium: 2.2 mg/dL (ref 1.7–2.4)

## 2018-03-24 MED ORDER — JUVEN PO PACK
1.0000 | PACK | Freq: Two times a day (BID) | ORAL | Status: DC
  Administered 2018-03-24 – 2018-03-28 (×9): 1
  Filled 2018-03-24 (×9): qty 1

## 2018-03-24 MED ORDER — POTASSIUM CHLORIDE CRYS ER 20 MEQ PO TBCR
20.0000 meq | EXTENDED_RELEASE_TABLET | Freq: Two times a day (BID) | ORAL | Status: DC
Start: 2018-03-24 — End: 2018-03-25
  Administered 2018-03-24 – 2018-03-25 (×3): 20 meq via ORAL
  Filled 2018-03-24 (×3): qty 1

## 2018-03-24 MED ORDER — PANTOPRAZOLE SODIUM 40 MG PO PACK
40.0000 mg | PACK | Freq: Every day | ORAL | Status: DC
  Administered 2018-03-24 – 2018-03-28 (×5): 40 mg
  Filled 2018-03-24 (×5): qty 20

## 2018-03-24 MED ORDER — APIXABAN 5 MG PO TABS
5.0000 mg | ORAL_TABLET | Freq: Two times a day (BID) | ORAL | Status: DC
  Administered 2018-03-24 – 2018-03-28 (×9): 5 mg
  Filled 2018-03-24 (×9): qty 1

## 2018-03-24 MED ORDER — FUROSEMIDE 40 MG PO TABS: 40.0000 mg | ORAL_TABLET | Freq: Every day | ORAL | Status: DC

## 2018-03-24 MED ORDER — FUROSEMIDE 40 MG PO TABS
40.0000 mg | ORAL_TABLET | Freq: Every day | ORAL | Status: DC
  Administered 2018-03-24 – 2018-03-25 (×2): 40 mg via ORAL
  Filled 2018-03-24 (×2): qty 1

## 2018-03-24 NOTE — Progress Notes (Signed)
Progress Note  Patient Name: John Sampson Date of Encounter: 03/24/2018  Primary Cardiologist: Rozann Lesches, MD   Subjective   No SOB  Inpatient Medications    Scheduled Meds: . apixaban  5 mg Per Tube BID  . budesonide (PULMICORT) nebulizer solution  0.5 mg Nebulization BID  . chlorhexidine  15 mL Mouth Rinse BID  . feeding supplement (PRO-STAT SUGAR FREE 64)  30 mL Per Tube BID  . folic acid  1 mg Per Tube Daily  . free water  200 mL Per Tube QID  . furosemide  40 mg Intravenous Q12H  . gabapentin  300 mg Per Tube QHS  . insulin aspart  0-9 Units Subcutaneous TID WC  . ipratropium-albuterol  3 mL Nebulization TID  . levothyroxine  75 mcg Per Tube QAC breakfast  . mouth rinse  15 mL Mouth Rinse q12n4p  . metoprolol tartrate  12.5 mg Per Tube BID  . mirtazapine  15 mg Per Tube QHS  . multivitamin  15 mL Per Tube Daily  . nutrition supplement (JUVEN)  1 packet Per Tube BID BM  . pantoprazole sodium  40 mg Per Tube Daily  . potassium chloride  20 mEq Oral BID  . simvastatin  20 mg Per Tube QHS  . sodium chloride flush  3 mL Intravenous Q12H   Continuous Infusions: . sodium chloride    . feeding supplement (JEVITY 1.2 CAL) 65 mL/hr at 03/23/18 2225   PRN Meds: sodium chloride, acetaminophen, ipratropium-albuterol, morphine injection, nitroGLYCERIN, ondansetron (ZOFRAN) IV, sodium chloride flush, white petrolatum   Vital Signs    Vitals:   03/23/18 2120 03/24/18 0550 03/24/18 0815 03/24/18 0832  BP:  (!) 100/48    Pulse: 83 71    Resp: 18 20    Temp:  98.7 F (37.1 C)    TempSrc:  Oral    SpO2: 91% 97%  96%  Weight:   184 lb 8.4 oz (83.7 kg)   Height:        Intake/Output Summary (Last 24 hours) at 03/24/2018 0928 Last data filed at 03/24/2018 0700 Gross per 24 hour  Intake 1693 ml  Output 2350 ml  Net -657 ml   Filed Weights   03/22/18 0500 03/23/18 0500 03/24/18 0815  Weight: 186 lb 1.1 oz (84.4 kg) 186 lb 8.2 oz (84.6 kg) 184 lb 8.4 oz (83.7 kg)      Telemetry    SR, 10 beat run wide complex - Personally Reviewed  ECG    na  Physical Exam   GEN: No acute distress.   Neck: No JVD Cardiac: RRR, no murmurs, rubs, or gallops.  Respiratory: Clear to auscultation bilaterally. GI: Soft, nontender, non-distended  MS: No edema; No deformity. Neuro:  Nonfocal  Psych: Normal affect   Labs    Chemistry Recent Labs  Lab 03/19/18 1516  03/22/18 0356 03/23/18 0412 03/24/18 0521  NA 137   < > 137 137 134*  K 4.5   < > 3.8 4.0 3.7  CL 98*   < > 94* 93* 89*  CO2 28   < > 31 32 31  GLUCOSE 114*   < > 97 108* 112*  BUN 22*   < > 43* 45* 59*  CREATININE 0.93   < > 0.97 0.90 1.05  CALCIUM 9.0   < > 8.7* 9.0 9.5  PROT 6.1*  --   --   --   --   ALBUMIN 3.1*  --   --   --   --  AST 27  --   --   --   --   ALT 25  --   --   --   --   ALKPHOS 70  --   --   --   --   BILITOT 0.7  --   --   --   --   GFRNONAA >60   < > >60 >60 >60  GFRAA >60   < > >60 >60 >60  ANIONGAP 11   < > 12 12 14    < > = values in this interval not displayed.     Hematology Recent Labs  Lab 03/19/18 1516 03/21/18 0426  WBC 8.2 12.6*  RBC 3.48* 3.04*  HGB 9.5* 8.4*  HCT 32.1* 27.7*  MCV 92.2 91.1  MCH 27.3 27.6  MCHC 29.6* 30.3  RDW 19.2* 19.0*  PLT 329 339    Cardiac Enzymes Recent Labs  Lab 03/19/18 1516 03/19/18 2058 03/20/18 0344 03/20/18 0825  TROPONINI 0.29* 0.24* 0.19* 0.19*   No results for input(s): TROPIPOC in the last 168 hours.   BNP Recent Labs  Lab 03/19/18 1516  BNP 395.0*     DDimer No results for input(s): DDIMER in the last 168 hours.   Radiology    No results found.  Cardiac Studies     Patient Profile     82 y.o. male w/ PMH of CAD (s/p CABG in 2003, low-risk NST in 12/2017), PAF (on Eliquis), chronic diastolic CHF, recurrent thyroid cancer, HTN, HLD, COPD and carotid artery stenosis who presented to [facility] ED on 03/19/2018 for worsening dyspnea and hypoxia. Admitted for acute hypoxic respiratory  failure and Cardiology has been consulted to assist with CHF management.     Assessment & Plan    1. Acute on chronic diastolic HF - Echo 04/7123: LVEF 58-09%, grade I diastolic dysfunction, possible hypokinesis basal inferior myocardium - negative 610mL yesterday, negative 4.8 L since admission. He is on lasix IV 40mg  bid, mild uptrend in Cr  - appears euvolemic, we will change to oral lasix 40mg  daily. (already received AM IV dose, start oral tomorrow).   2. Elevated troponin/CAD Mild trop elevation trending down likely demand ischemia in setting of hypoxia and CHF.  -s/p CABG in 2003with low-risk NST in 12/2017. Cyclic troponin values have been flat this admission and peaked at 0.29. Repeat echo showsa preserved EF of 60 to 65% with possible hypokinesis of the basal inferior myocardium and grade 1 diastolic dysfunction. - felt to be secondary to demand ischemia in the setting of his CHF exacerbation.No plans for further ischemic evaluation at this time.   3. PAF - rate control, anticoag  4. NSVT - keep K at 4, Mg at 2. Continue beta blocker.   [place] for discharge from cardiac stanpdoint, we will arrange f/u in 2-3 weeks.   For questions or updates, please contact [place] Please consult [email or web address] for contact info under Cardiology/STEMI.      Merrily Pew, MD  03/24/2018, 9:28 AM

## 2018-03-24 NOTE — Progress Notes (Signed)
Subjective: He says he feels better.  No complaints.  His breathing is improved.  Objective: Vital signs in last 24 hours: Temp:  [97.8 F (36.6 C)-98.7 F (37.1 C)] 98.7 F (37.1 C) (04/26 0550) Pulse Rate:  [58-83] 71 (04/26 0550) Resp:  [15-24] 20 (04/26 0550) BP: (87-118)/(42-56) 100/48 (04/26 0550) SpO2:  [89 %-97 %] 97 % (04/26 0550) Weight:  [83.7 kg (184 lb 8.4 oz)] 83.7 kg (184 lb 8.4 oz) (04/26 0815) Weight change:  Last BM Date: 03/23/18  Intake/Output from previous day: 04/25 0701 - 04/26 0700 In: 1693 [I.V.:3; NG/GT:1630] Out: 2350 [Urine:2350]  PHYSICAL EXAM General appearance: alert, cooperative and no distress Resp: clear to auscultation bilaterally Cardio: regular rate and rhythm, S1, S2 normal, no murmur, click, rub or gallop GI: soft, non-tender; bowel sounds normal; no masses,  no organomegaly Extremities: extremities normal, atraumatic, no cyanosis or edema  Lab Results:  Results for orders placed or performed during the hospital encounter of 03/19/18 (from the past 48 hour(s))  Glucose, capillary     Status: Abnormal   Collection Time: 03/22/18 11:09 AM  Result Value Ref Range   Glucose-Capillary 115 (H) 65 - 99 mg/dL  Glucose, capillary     Status: Abnormal   Collection Time: 03/22/18  4:46 PM  Result Value Ref Range   Glucose-Capillary 107 (H) 65 - 99 mg/dL   Comment 1 Notify RN    Comment 2 Document in Chart   Glucose, capillary     Status: Abnormal   Collection Time: 03/22/18  9:35 PM  Result Value Ref Range   Glucose-Capillary 109 (H) 65 - 99 mg/dL  Basic metabolic panel     Status: Abnormal   Collection Time: 03/23/18  4:12 AM  Result Value Ref Range   Sodium 137 135 - 145 mmol/L   Potassium 4.0 3.5 - 5.1 mmol/L   Chloride 93 (L) 101 - 111 mmol/L   CO2 32 22 - 32 mmol/L   Glucose, Bld 108 (H) 65 - 99 mg/dL   BUN 45 (H) 6 - 20 mg/dL   Creatinine, Ser 0.90 0.61 - 1.24 mg/dL   Calcium 9.0 8.9 - 10.3 mg/dL   GFR calc non Af Amer >60 >60  mL/min   GFR calc Af Amer >60 >60 mL/min    Comment: (NOTE) The eGFR has been calculated using the CKD EPI equation. This calculation has not been validated in all clinical situations. eGFR's persistently <60 mL/min signify possible Chronic Kidney Disease.    Anion gap 12 5 - 15    Comment: Performed at [facility], [address], [place], [place] [address]  Glucose, capillary     Status: Abnormal   Collection Time: 03/23/18  7:54 AM  Result Value Ref Range   Glucose-Capillary 137 (H) 65 - 99 mg/dL  Glucose, capillary     Status: Abnormal   Collection Time: 03/23/18 11:02 AM  Result Value Ref Range   Glucose-Capillary 117 (H) 65 - 99 mg/dL  Glucose, capillary     Status: Abnormal   Collection Time: 03/23/18  4:46 PM  Result Value Ref Range   Glucose-Capillary 118 (H) 65 - 99 mg/dL  Glucose, capillary     Status: Abnormal   Collection Time: 03/23/18  8:48 PM  Result Value Ref Range   Glucose-Capillary 103 (H) 65 - 99 mg/dL   Comment 1 Notify RN    Comment 2 Document in Chart   Basic metabolic panel     Status: Abnormal  Collection Time: 03/24/18  5:21 AM  Result Value Ref Range   Sodium 134 (L) 135 - 145 mmol/L   Potassium 3.7 3.5 - 5.1 mmol/L   Chloride 89 (L) 101 - 111 mmol/L   CO2 31 22 - 32 mmol/L   Glucose, Bld 112 (H) 65 - 99 mg/dL   BUN 59 (H) 6 - 20 mg/dL   Creatinine, Ser 1.05 0.61 - 1.24 mg/dL   Calcium 9.5 8.9 - 10.3 mg/dL   GFR calc non Af Amer >60 >60 mL/min   GFR calc Af Amer >60 >60 mL/min    Comment: (NOTE) The eGFR has been calculated using the CKD EPI equation. This calculation has not been validated in all clinical situations. eGFR's persistently <60 mL/min signify possible Chronic Kidney Disease.    Anion gap 14 5 - 15    Comment: Performed at [facility], [address], [place], [place] [address]  Magnesium     Status: None   Collection Time: 03/24/18  5:21 AM  Result Value Ref Range   Magnesium 2.2 1.7 - 2.4 mg/dL    Comment: Performed at  [facility], [address], [place], [place] [address]  Glucose, capillary     Status: Abnormal   Collection Time: 03/24/18  7:45 AM  Result Value Ref Range   Glucose-Capillary 106 (H) 65 - 99 mg/dL   Comment 1 Notify RN    Comment 2 Document in Chart     ABGS No results for input(s): PHART, PO2ART, TCO2, HCO3 in the last 72 hours.  Invalid input(s): PCO2 CULTURES Recent Results (from the past 240 hour(s))  Blood Culture (routine x 2)     Status: None   Collection Time: 03/19/18  3:06 PM  Result Value Ref Range Status   Specimen Description BLOOD RIGHT FOREARM  Final   Special Requests   Final    ANAEROBIC BOTTLE ONLY Blood Culture results may not be optimal due to an inadequate volume of blood received in culture bottles   Culture   Final    NO GROWTH 5 DAYS Performed at [facility], [address] [place], [place] [address]    Report Status 03/24/2018 FINAL  Final  Blood Culture (routine x 2)     Status: None   Collection Time: 03/19/18  3:15 PM  Result Value Ref Range Status   Specimen Description BLOOD RIGHT HAND  Final   Special Requests   Final    BOTTLES DRAWN AEROBIC ONLY Blood Culture results may not be optimal due to an inadequate volume of blood received in culture bottles   Culture   Final    NO GROWTH 5 DAYS Performed at [facility], [address] [place], [place] [address]    Report Status 03/24/2018 FINAL  Final  MRSA PCR Screening     Status: None   Collection Time: 03/19/18  8:08 PM  Result Value Ref Range Status   MRSA by PCR NEGATIVE NEGATIVE Final    Comment:        The GeneXpert MRSA Assay (FDA approved for NASAL specimens only), is one component of a comprehensive MRSA colonization surveillance program. It is not intended to diagnose MRSA infection nor to guide or monitor treatment for MRSA infections. Performed at [facility], [address], [place], [place] [address]    Studies/Results: No results found.  Medications:  Prior to  Admission:  Medications Prior to Admission  Medication Sig Dispense Refill Last Dose  . Amino Acids-Protein Hydrolys (FEEDING  SUPPLEMENT, PRO-STAT SUGAR FREE 64,) LIQD Place 30 mLs into feeding tube 2 (two) times daily. 900 mL 0 03/19/2018 at Unknown time  . apixaban (ELIQUIS) 5 MG TABS tablet Take 1 tablet (5 mg total) by mouth 2 (two) times daily. Ok to crush with oral food ( NOT PEG) 60 tablet 3 03/19/2018 at 8am  . cefTRIAXone (ROCEPHIN) 1 g injection Inject 1 g into the muscle once. For infection   03/16/2018  . doxycycline (VIBRAMYCIN) 100 MG capsule Take 100 mg by mouth 2 (two) times daily.   03/19/2018 at Unknown time  . folic acid (FOLVITE) 1 MG tablet Take 1 tablet (1 mg total) by mouth daily.   03/19/2018 at Unknown time  . furosemide (LASIX) 10 mg/mL SOLN Inject 40 mg into the vein once. Intramuscularly one time for CHF for 1 day   03/16/2018  . gabapentin (NEURONTIN) 300 MG capsule Take 600 mg by mouth 2 (two) times daily.   03/19/2018 at Unknown time  . ipratropium-albuterol (DUONEB) 0.5-2.5 (3) MG/3ML SOLN Take 3 mLs by nebulization every 8 (eight) hours. For 2 days   03/17/2018  . levothyroxine (SYNTHROID, LEVOTHROID) 75 MCG tablet Take 1 tablet (75 mcg total) by mouth daily before breakfast.   03/19/2018 at Unknown time  . Melatonin 5 MG CAPS Take 10 capsules by mouth at bedtime.   03/18/2018 at Unknown time  . metoprolol tartrate (LOPRESSOR) 50 MG tablet Take 1 tablet (50 mg total) by mouth 2 (two) times daily.   03/19/2018 at 8am  . mirtazapine (REMERON) 15 MG tablet Take 1 tablet (15 mg total) by mouth at bedtime.   03/18/2018 at Unknown time  . Nutritional Supplements (FEEDING SUPPLEMENT, JEVITY 1.2 CAL,) LIQD Place 1,000 mLs into feeding tube continuous.  0 03/19/2018 at Unknown time  . omeprazole (PRILOSEC) 20 MG capsule Take 1 capsule (20 mg total) by mouth 2 (two) times daily before a meal.   03/19/2018 at Unknown time  . simvastatin (ZOCOR) 40 MG tablet Take 0.5 tablets (20 mg total) by  mouth at bedtime. 30 tablet 12 03/18/2018 at Unknown time  . traMADol (ULTRAM) 50 MG tablet Take 50 mg by mouth every 6 (six) hours as needed.   03/17/2018  . Water For Irrigation, Sterile (FREE WATER) SOLN Place 200 mLs into feeding tube 4 (four) times daily.   03/19/2018 at Unknown time  . white petrolatum (VASELINE) GEL Apply 1 application topically as needed for lip care.  0 03/19/2018 at Unknown time  . albuterol (PROVENTIL HFA) 108 (90 Base) MCG/ACT inhaler Inhale 2 puffs into the lungs every 6 (six) hours as needed for wheezing or shortness of breath. 1 Inhaler 3 unknown  . diltiazem (CARDIZEM CD) 240 MG 24 hr capsule HOLD until seen by PCP 90 capsule 3   . furosemide (LASIX) 20 MG tablet HOLD until seen by PCP 90 tablet 3 03/10/2018  . lisinopril (PRINIVIL,ZESTRIL) 40 MG tablet HOLD until seen by PCP 90 tablet 3 unknown  . Maltodextrin-Xanthan Gum (RESOURCE THICKENUP CLEAR) POWD Take 240 g by mouth as needed.   unknown  . nitroGLYCERIN (NITROSTAT) 0.4 MG SL tablet Place 1 tablet (0.4 mg total) under the tongue every 5 (five) minutes as needed for chest pain. 30 tablet 9 unknown   Scheduled: . apixaban  5 mg Per Tube BID  . budesonide (PULMICORT) nebulizer solution  0.5 mg Nebulization BID  . chlorhexidine  15 mL Mouth Rinse BID  . feeding supplement (PRO-STAT SUGAR FREE 64)  30 mL  Per Tube BID  . folic acid  1 mg Per Tube Daily  . free water  200 mL Per Tube QID  . furosemide  40 mg Intravenous Q12H  . gabapentin  300 mg Per Tube QHS  . insulin aspart  0-9 Units Subcutaneous TID WC  . ipratropium-albuterol  3 mL Nebulization TID  . levothyroxine  75 mcg Per Tube QAC breakfast  . mouth rinse  15 mL Mouth Rinse q12n4p  . metoprolol tartrate  12.5 mg Per Tube BID  . mirtazapine  15 mg Per Tube QHS  . multivitamin  15 mL Per Tube Daily  . nutrition supplement (JUVEN)  1 packet Per Tube BID BM  . pantoprazole sodium  40 mg Per Tube Daily  . simvastatin  20 mg Per Tube QHS  . sodium  chloride flush  3 mL Intravenous Q12H   Continuous: . sodium chloride    . feeding supplement (JEVITY 1.2 CAL) 65 mL/hr at 03/23/18 2225   KTG:YBWLSL chloride, acetaminophen, ipratropium-albuterol, morphine injection, nitroGLYCERIN, ondansetron (ZOFRAN) IV, sodium chloride flush, white petrolatum  Assesment: He was admitted with acute on chronic hypoxic respiratory failure.  This is related to acute on chronic diastolic heart failure.  On admission there was concern that he was septic but that does not appear to be the case and he is off antibiotics now.  Blood cultures are negative.  He is down about 5 L.  He is had some cardiac arrhythmias.  Potassium this morning is 3.7 magnesium is 2.2.  He has COPD at baseline and that seems pretty stable on current meds  He has thyroid cancer and he is doing a rehab stay to try to get stronger to see if he is a candidate for any further treatment for his thyroid cancer. Active Problems:   Essential hypertension, benign   COPD (chronic obstructive pulmonary disease) (HCC)   PAF (paroxysmal atrial fibrillation) (HCC)   Acute on chronic diastolic CHF (congestive heart failure) (HCC)   Thyroid cancer (HCC)   CAD (coronary artery disease)   Hypothyroidism (acquired)   Acute on chronic respiratory failure with hypoxia (HCC)   Elevated troponin   Lactic acid acidosis   Pressure injury of skin    Plan: He will start potassium replacement.  Cardiology is following with me and wake the decision today about whether he can switch to oral Lasix.  If he can and if he does well with that he could probably be transferred back to his rehab facility tomorrow    LOS: 5 days   Jakylan Ron L 03/24/2018, 8:37 AM

## 2018-03-24 NOTE — Care Management Important Message (Signed)
Important Message  Patient Details  Name: ALEX LEAHY MRN: [id number] Date of Birth: 1936/10/23   Medicare Important Message Given:  Yes    Carlton Sweaney, Chauncey Reading, RN 03/24/2018, 12:54 PM

## 2018-03-24 NOTE — Clinical Social Work Note (Signed)
LCSW following. Reviewed pt's record today. Per Dr. Luan Pulling, pt will potentially be stable to return to [facility] tomorrow. Updated Debbie at [employer]. Also updated pt's son, Konrad Dolores, by phone.   Weekend CSW will be available to assist with dc needs. Debbie at [facility] asks to be called on her cell [phone]) when dc clinicals are available. Will leave this request for weekend coverage.

## 2018-03-25 ENCOUNTER — Inpatient Hospital Stay (HOSPITAL_COMMUNITY): Payer: Medicare Other

## 2018-03-25 DIAGNOSIS — I10 Essential (primary) hypertension: Secondary | ICD-10-CM

## 2018-03-25 DIAGNOSIS — R0600 Dyspnea, unspecified: Secondary | ICD-10-CM | POA: Diagnosis present

## 2018-03-25 DIAGNOSIS — J9621 Acute and chronic respiratory failure with hypoxia: Secondary | ICD-10-CM

## 2018-03-25 DIAGNOSIS — R0609 Other forms of dyspnea: Secondary | ICD-10-CM

## 2018-03-25 LAB — GLUCOSE, CAPILLARY
GLUCOSE-CAPILLARY: 100 mg/dL — AB (ref 65–99)
GLUCOSE-CAPILLARY: 154 mg/dL — AB (ref 65–99)
Glucose-Capillary: 101 mg/dL — ABNORMAL HIGH (ref 65–99)
Glucose-Capillary: 120 mg/dL — ABNORMAL HIGH (ref 65–99)

## 2018-03-25 LAB — BASIC METABOLIC PANEL
ANION GAP: 13 (ref 5–15)
BUN: 57 mg/dL — ABNORMAL HIGH (ref 6–20)
CALCIUM: 9.3 mg/dL (ref 8.9–10.3)
CO2: 32 mmol/L (ref 22–32)
Chloride: 91 mmol/L — ABNORMAL LOW (ref 101–111)
Creatinine, Ser: 1.03 mg/dL (ref 0.61–1.24)
GFR calc non Af Amer: >60 mL/min (ref >60)
Glucose, Bld: 114 mg/dL — ABNORMAL HIGH (ref 65–99)
POTASSIUM: 3.9 mmol/L (ref 3.5–5.1)
Sodium: 136 mmol/L (ref 135–145)

## 2018-03-25 LAB — CBC
HEMATOCRIT: 32 % — AB (ref 39.0–52.0)
HEMOGLOBIN: 9.3 g/dL — AB (ref 13.0–17.0)
MCH: 26.7 pg (ref 26.0–34.0)
MCHC: 29.1 g/dL — ABNORMAL LOW (ref 30.0–36.0)
MCV: 92 fL (ref 78.0–100.0)
Platelets: 347 10*3/uL (ref 150–400)
RBC: 3.48 MIL/uL — AB (ref 4.22–5.81)
RDW: 18.1 % — ABNORMAL HIGH (ref 11.5–15.5)
WBC: 17.8 10*3/uL — ABNORMAL HIGH (ref 4.0–10.5)

## 2018-03-25 MED ORDER — ARFORMOTEROL TARTRATE 15 MCG/2ML IN NEBU: 15.0000 ug | INHALATION_SOLUTION | Freq: Two times a day (BID) | RESPIRATORY_TRACT | Status: DC

## 2018-03-25 MED ORDER — METOPROLOL TARTRATE 25 MG PO TABS: 12.5000 mg | ORAL_TABLET | Freq: Two times a day (BID) | ORAL | Status: AC

## 2018-03-25 MED ORDER — IPRATROPIUM-ALBUTEROL 0.5-2.5 (3) MG/3ML IN SOLN: 3.0000 mL | Freq: Three times a day (TID) | RESPIRATORY_TRACT | Status: AC

## 2018-03-25 MED ORDER — FOLIC ACID 1 MG PO TABS: 1.0000 mg | ORAL_TABLET | Freq: Every day | ORAL | Status: DC

## 2018-03-25 MED ORDER — SIMVASTATIN 20 MG PO TABS: 20.0000 mg | ORAL_TABLET | Freq: Every day | ORAL | Status: AC

## 2018-03-25 MED ORDER — LEVOTHYROXINE SODIUM 75 MCG PO TABS: 75.0000 ug | ORAL_TABLET | Freq: Every day | ORAL | Status: DC

## 2018-03-25 MED ORDER — POTASSIUM CHLORIDE 20 MEQ/15ML (10%) PO SOLN
20.0000 meq | Freq: Two times a day (BID) | ORAL | Status: DC
  Administered 2018-03-25: 20 meq via ORAL
  Filled 2018-03-25: qty 30

## 2018-03-25 MED ORDER — ALBUTEROL SULFATE (2.5 MG/3ML) 0.083% IN NEBU: 2.5000 mg | INHALATION_SOLUTION | Freq: Four times a day (QID) | RESPIRATORY_TRACT | 12 refills | Status: DC | PRN

## 2018-03-25 MED ORDER — FUROSEMIDE 40 MG PO TABS: ORAL_TABLET | ORAL | Status: AC

## 2018-03-25 MED ORDER — JUVEN PO PACK: 1.0000 | PACK | Freq: Two times a day (BID) | ORAL | 0 refills | Status: AC

## 2018-03-25 MED ORDER — BUDESONIDE 0.5 MG/2ML IN SUSP: 0.5000 mg | Freq: Two times a day (BID) | RESPIRATORY_TRACT | 12 refills | Status: AC

## 2018-03-25 MED ORDER — POTASSIUM CHLORIDE 20 MEQ/15ML (10%) PO SOLN: ORAL | 0 refills | Status: DC

## 2018-03-25 MED ORDER — MIRTAZAPINE 15 MG PO TABS: 15.0000 mg | ORAL_TABLET | Freq: Every day | ORAL | Status: AC

## 2018-03-25 MED ORDER — ARFORMOTEROL TARTRATE 15 MCG/2ML IN NEBU
15.0000 ug | INHALATION_SOLUTION | Freq: Two times a day (BID) | RESPIRATORY_TRACT | Status: DC
  Administered 2018-03-25 – 2018-03-28 (×6): 15 ug via RESPIRATORY_TRACT
  Filled 2018-03-25 (×7): qty 2

## 2018-03-25 MED ORDER — APIXABAN 5 MG PO TABS: 5.0000 mg | ORAL_TABLET | Freq: Two times a day (BID) | ORAL | Status: AC

## 2018-03-25 MED ORDER — PANTOPRAZOLE SODIUM 40 MG PO PACK: 40.0000 mg | PACK | Freq: Every day | ORAL | Status: AC

## 2018-03-25 MED ORDER — NITROGLYCERIN 0.4 MG SL SUBL: 0.4000 mg | SUBLINGUAL_TABLET | SUBLINGUAL | 12 refills | Status: AC | PRN

## 2018-03-25 MED ORDER — DIPHENHYDRAMINE HCL 25 MG PO CAPS
25.0000 mg | ORAL_CAPSULE | Freq: Once | ORAL | Status: DC
  Filled 2018-03-25: qty 1

## 2018-03-25 MED ORDER — GABAPENTIN 250 MG/5ML PO SOLN
300.0000 mg | Freq: Every day | ORAL | 12 refills | Status: DC
Start: 2018-03-25 — End: 2018-05-21

## 2018-03-25 MED ORDER — ADULT MULTIVITAMIN LIQUID CH: 15.0000 mL | Freq: Every day | ORAL | Status: AC

## 2018-03-25 MED ORDER — ACETAMINOPHEN 325 MG PO TABS: 650.0000 mg | ORAL_TABLET | ORAL | Status: AC | PRN

## 2018-03-25 MED ORDER — JEVITY 1.2 CAL PO LIQD: 1000.0000 mL | ORAL | 0 refills | Status: AC

## 2018-03-25 MED ORDER — FREE WATER: 200.0000 mL | Freq: Four times a day (QID) | Status: AC

## 2018-03-25 MED ORDER — METHYLPREDNISOLONE SODIUM SUCC 125 MG IJ SOLR
60.0000 mg | INTRAMUSCULAR | Status: DC
  Administered 2018-03-25 – 2018-03-27 (×3): 60 mg via INTRAVENOUS
  Filled 2018-03-25 (×3): qty 2

## 2018-03-25 NOTE — Progress Notes (Signed)
Responded to nursing call: Patient complains of shortness of breath EMS at the door ready to take the patient to [facility] on 3 L nasal cannula -The patient's medical record was reviewed  Subjective: -Patient stated that he was short of breath for the past 1 to 2 hours -He denies any fevers, chills, chest pain, abdominal pain, nausea, vomiting  VS-->HR 66--RR20--106/47--96% on 3L  Vitals:   03/24/18 2103 03/25/18 0541 03/25/18 0757 03/25/18 1439  BP:  (!) 116/52    Pulse: 64 (!) 59    Resp:  18    Temp:  97.8 F (36.6 C)    TempSrc:  Oral    SpO2:  99% 98% 96%  Weight:  89 kg (196 lb 3.4 oz)    Height:       CV--RRR-no rub Lung--bibasilar rales.  Mild bibasilar wheezing.  Good air movement. Abd--soft+BS/NT   Assessment/Plan: Dyspnea -Patient is not in any distress; no use of accessory muscles -Appears clinically euvolemic -Stat EKG -Stat chest x-ray -Bronchodilators administered with some improvement -Difficult to tell if the patient's mild wheezing is due to his vocal cord dysfunction or a degree of bronchospasm from his COPD -Add Brovana -Start IV Solu-Medrol -Cancel discharge -check bmp/cbc  Acute on chronic diastolic CHF -Switched to oral furosemide 03/24/2018 -Appears clinically euvolemic  Paroxysmal atrial fibrillation -Presently in sinus -Continue apixaban -Rate controlled  Acute on chronic respiratory failure -Previously on 2 L nasal cannula prior to admission -Presently on 3 L -Multifactorial including CHF and COPD  The patient is critically ill with multiple organ systems failure and requires high complexity decision making for assessment and support, frequent evaluation and titration of therapies, application of advanced monitoring technologies and extensive interpretation of multiple databases.  Critical care time - 35 mins.    Orson Eva, DO [facility]

## 2018-03-25 NOTE — Discharge Summary (Signed)
Physician Discharge Summary  Patient ID: John Sampson MRN: [id number] DOB/AGE: 1935-12-08 82 y.o. Primary Care Physician:Tavari Loadholt, Percell Miller, MD Admit date: 03/19/2018 Discharge date: 03/25/2018    Discharge Diagnoses:   Active Problems:   Essential hypertension, benign   COPD (chronic obstructive pulmonary disease) (HCC)   PAF (paroxysmal atrial fibrillation) (HCC)   Acute on chronic diastolic CHF (congestive heart failure) (HCC)   Thyroid cancer (HCC)   CAD (coronary artery disease)   Hypothyroidism (acquired)   Acute on chronic respiratory failure with hypoxia (HCC)   Elevated troponin   Lactic acid acidosis   Pressure injury of skin   Allergies as of 03/25/2018      Reactions   Morphine And Related Nausea And Vomiting      Medication List    STOP taking these medications   albuterol 108 (90 Base) MCG/ACT inhaler Commonly known as:  PROVENTIL HFA Replaced by:  albuterol (2.5 MG/3ML) 0.083% nebulizer solution   cefTRIAXone 1 g injection Commonly known as:  ROCEPHIN   diltiazem 240 MG 24 hr capsule Commonly known as:  CARDIZEM CD   doxycycline 100 MG capsule Commonly known as:  VIBRAMYCIN   feeding supplement (PRO-STAT SUGAR FREE 64) Liqd   gabapentin 300 MG capsule Commonly known as:  NEURONTIN Replaced by:  gabapentin 250 MG/5ML solution   lisinopril 40 MG tablet Commonly known as:  PRINIVIL,ZESTRIL   Melatonin 5 MG Caps   omeprazole 20 MG capsule Commonly known as:  PRILOSEC   RESOURCE THICKENUP CLEAR Powd   white petrolatum Gel Commonly known as:  VASELINE     TAKE these medications   acetaminophen 325 MG tablet Commonly known as:  TYLENOL Place 2 tablets (650 mg total) into feeding tube every 4 (four) hours as needed for headache or mild pain.   albuterol (2.5 MG/3ML) 0.083% nebulizer solution Commonly known as:  PROVENTIL Take 3 mLs (2.5 mg total) by nebulization every 6 (six) hours as needed for wheezing or shortness of breath. Replaces:   albuterol 108 (90 Base) MCG/ACT inhaler   apixaban 5 MG Tabs tablet Commonly known as:  ELIQUIS Place 1 tablet (5 mg total) into feeding tube 2 (two) times daily. What changed:    how to take this  additional instructions   budesonide 0.5 MG/2ML nebulizer solution Commonly known as:  PULMICORT Take 2 mLs (0.5 mg total) by nebulization 2 (two) times daily.   feeding supplement (JEVITY 1.2 CAL) Liqd Place 1,000 mLs into feeding tube continuous. What changed:  Another medication with the same name was added. Make sure you understand how and when to take each.   nutrition supplement (JUVEN) Pack Place 1 packet into feeding tube 2 (two) times daily between meals. What changed:  You were already taking a medication with the same name, and this prescription was added. Make sure you understand how and when to take each.   folic acid 1 MG tablet Commonly known as:  FOLVITE Place 1 tablet (1 mg total) into feeding tube daily. What changed:  how to take this   free water Soln Place 200 mLs into feeding tube 4 (four) times daily.   furosemide 40 MG tablet Commonly known as:  LASIX 1 per tube daily What changed:    medication strength  additional instructions  Another medication with the same name was removed. Continue taking this medication, and follow the directions you see here.   gabapentin 250 MG/5ML solution Commonly known as:  NEURONTIN Place 6 mLs (300 mg total) into  feeding tube at bedtime. Replaces:  gabapentin 300 MG capsule   ipratropium-albuterol 0.5-2.5 (3) MG/3ML Soln Commonly known as:  DUONEB Take 3 mLs by nebulization 3 (three) times daily. What changed:    when to take this  additional instructions   levothyroxine 75 MCG tablet Commonly known as:  SYNTHROID, LEVOTHROID Place 1 tablet (75 mcg total) into feeding tube daily before breakfast. What changed:  how to take this   metoprolol tartrate 25 MG tablet Commonly known as:  LOPRESSOR Place 0.5  tablets (12.5 mg total) into feeding tube 2 (two) times daily. What changed:    medication strength  how much to take  how to take this   mirtazapine 15 MG tablet Commonly known as:  REMERON Place 1 tablet (15 mg total) into feeding tube at bedtime. What changed:  how to take this   multivitamin Liqd Place 15 mLs into feeding tube daily.   nitroGLYCERIN 0.4 MG SL tablet Commonly known as:  NITROSTAT Place 1 tablet (0.4 mg total) under the tongue every 5 (five) minutes as needed for chest pain (contact MD if use needed). What changed:  reasons to take this   pantoprazole sodium 40 mg/20 mL Pack Commonly known as:  PROTONIX Place 20 mLs (40 mg total) into feeding tube daily.   potassium chloride 20 MEQ/15ML (10%) Soln 20 mEq per tube daily   simvastatin 20 MG tablet Commonly known as:  ZOCOR Place 1 tablet (20 mg total) into feeding tube at bedtime. What changed:    medication strength  how to take this   traMADol 50 MG tablet Commonly known as:  ULTRAM Take 50 mg by mouth every 6 (six) hours as needed.       Discharged Condition: Improved    Consults: Cardiology  Significant Diagnostic Studies: Ct Abdomen Wo Contrast  Result Date: 02/23/2018 CLINICAL DATA:  Dysphagia, aspiration risk, assess anatomy for gastrostomy insertion EXAM: CT ABDOMEN WITHOUT CONTRAST TECHNIQUE: Multidetector CT imaging of the abdomen was performed following the standard protocol without IV contrast. COMPARISON:  02/22/2018 modified swallow FINDINGS: Lower chest: Several bilateral lower chest pulmonary nodules all measuring 5 mm or less in size within the lower lobes, inferior right middle lobe, and lingula Right basilar atelectasis versus scarring also noted. Normal heart size. Native coronary atherosclerosis. Previous median sternotomy evident. Feeding tube extends into the stomach distally. Hepatobiliary: Cholelithiasis evident. No focal hepatic abnormality. No biliary dilatation or  obstruction. Pancreas: No focal abnormality or ductal dilatation. No surrounding inflammatory process. Spleen: Normal in size. Adrenals/Urinary Tract: Normal adrenal glands. Kidneys demonstrate no acute obstructive uropathy or hydronephrosis. Ureters are symmetric and decompressed. Mild chronic perinephric strandy edema. Indeterminate posterior left renal hyperdense cortical lesion measures 10 mm this may represent a proteinaceous or hemorrhagic cyst. Stomach/Bowel: Feeding tube enters the stomach. No significant hiatal hernia. Stomach anatomy appears favorable for fluoroscopic gastrostomy technique. Stomach is inferior to the left hepatic lobe and the midportion of the stomach is superior to the transverse colon. Feeding tube extends into the duodenal bulb. Stomach is currently decompressed. Vascular/Lymphatic: Aortic atherosclerosis noted. Negative for aneurysm. No retroperitoneal hemorrhage or hematoma. No abdominal adenopathy. Other: No abdominal wall hernia. No free fluid or ascites. Negative for abscess. Musculoskeletal: No acute osseous finding. Anterior L2 vertebral body lytic lesion without compression pathologic fracture. Appearance is suspicious for metastatic disease or myeloma. IMPRESSION: Stomach anatomy appears favorable for attempt at fluoroscopic gastrostomy insertion. Numerous subcentimeter bilateral pulmonary nodules suspicious for pulmonary metastases. Lytic L2 vertebral body suspicious for  osseous metastasis. Atherosclerosis without aneurysm Cholelithiasis Electronically Signed   By: Jerilynn Mages.  Shick M.D.   On: 02/23/2018 16:06   Ir Gastrostomy Tube Mod Sed  Result Date: 02/24/2018 INDICATION: Thyroid cancer, dysphagia, malnutrition EXAM: FLUOROSCOPIC 20 FRENCH PULL-THROUGH GASTROSTOMY Date:  02/24/2018 02/24/2018 1:43 pm Radiologist:  Jerilynn Mages. Daryll Brod, MD Guidance:  Fluoroscopic MEDICATIONS: 3.375 g Zosyn; Antibiotics were administered within 1 hour of the procedure. Glucagon 0.5 mg IV  ANESTHESIA/SEDATION: Versed 1.5 mg IV; Fentanyl 75 mcg IV Moderate Sedation Time:  11 minutes The patient was continuously monitored during the procedure by the interventional radiology nurse under my direct supervision. CONTRAST:  10 cc Isovue-administered into the gastric lumen. FLUOROSCOPY TIME:  Fluoroscopy Time: 1 minutes 18 seconds (12 mGy). COMPLICATIONS: None immediate. PROCEDURE: Informed consent was obtained from the patient following explanation of the procedure, risks, benefits and alternatives. The patient understands, agrees and consents for the procedure. All questions were addressed. A time out was performed. Maximal barrier sterile technique utilized including caps, mask, sterile gowns, sterile gloves, large sterile drape, hand hygiene, and betadine prep. The left upper quadrant was sterilely prepped and draped. An oral gastric catheter was inserted into the stomach under fluoroscopy. The existing nasogastric feeding tube was removed. Air was injected into the stomach for insufflation and visualization under fluoroscopy. The air distended stomach was confirmed beneath the anterior abdominal wall in the frontal and lateral projections. Under sterile conditions and local anesthesia, a 62 gauge trocar needle was utilized to access the stomach percutaneously beneath the left subcostal margin. Needle position was confirmed within the stomach under biplane fluoroscopy. Contrast injection confirmed position also. A single T tack was deployed for gastropexy. Over an Amplatz guide wire, a 9-French sheath was inserted into the stomach. A snare device was utilized to capture the oral gastric catheter. The snare device was pulled retrograde from the stomach up the esophagus and out the oropharynx. The 20-French pull-through gastrostomy was connected to the snare device and pulled antegrade through the oropharynx down the esophagus into the stomach and then through the percutaneous tract external to the patient.  The gastrostomy was assembled externally. Contrast injection confirms position in the stomach. Images were obtained for documentation. The patient tolerated procedure well. No immediate complication. IMPRESSION: Fluoroscopic insertion of a 20-French "pull-through" gastrostomy. Electronically Signed   By: Jerilynn Mages.  Shick M.D.   On: 02/24/2018 13:45   Dg Chest Portable 1 View  Result Date: 03/19/2018 CLINICAL DATA:  Difficulty breathing for 1 hour with hypoxia EXAM: PORTABLE CHEST 1 VIEW COMPARISON:  02/22/2018 FINDINGS: Cardiac shadow is stable from the prior exam. Postsurgical changes are noted. Vascular congestion is now seen with mild parenchymal edema. No sizable effusion is noted. No bony abnormality is noted. IMPRESSION: Changes of mild CHF. Electronically Signed   By: Inez Catalina M.D.   On: 03/19/2018 15:47    Lab Results: Basic Metabolic Panel: Recent Labs    03/23/18 0412 03/24/18 0521  NA 137 134*  K 4.0 3.7  CL 93* 89*  CO2 32 31  GLUCOSE 108* 112*  BUN 45* 59*  CREATININE 0.90 1.05  CALCIUM 9.0 9.5  MG  --  2.2   Liver Function Tests: No results for input(s): AST, ALT, ALKPHOS, BILITOT, PROT, ALBUMIN in the last 72 hours.   CBC: No results for input(s): WBC, NEUTROABS, HGB, HCT, MCV, PLT in the last 72 hours.  Recent Results (from the past 240 hour(s))  Blood Culture (routine x 2)     Status: None  Collection Time: 03/19/18  3:06 PM  Result Value Ref Range Status   Specimen Description BLOOD RIGHT FOREARM  Final   Special Requests   Final    ANAEROBIC BOTTLE ONLY Blood Culture results may not be optimal due to an inadequate volume of blood received in culture bottles   Culture   Final    NO GROWTH 5 DAYS Performed at [facility], [address], [place], [place] [address]    Report Status 03/24/2018 FINAL  Final  Blood Culture (routine x 2)     Status: None   Collection Time: 03/19/18  3:15 PM  Result Value Ref Range Status   Specimen Description BLOOD RIGHT HAND   Final   Special Requests   Final    BOTTLES DRAWN AEROBIC ONLY Blood Culture results may not be optimal due to an inadequate volume of blood received in culture bottles   Culture   Final    NO GROWTH 5 DAYS Performed at [facility], [address] [place], [place] [address]    Report Status 03/24/2018 FINAL  Final  MRSA PCR Screening     Status: None   Collection Time: 03/19/18  8:08 PM  Result Value Ref Range Status   MRSA by PCR NEGATIVE NEGATIVE Final    Comment:        The GeneXpert MRSA Assay (FDA approved for NASAL specimens only), is one component of a comprehensive MRSA colonization surveillance program. It is not intended to diagnose MRSA infection nor to guide or monitor treatment for MRSA infections. Performed at [facility], [address] [place], [place] [address]      Hospital Course: This is an 82 year old who was sent from his skilled care facility because of increasing shortness of breath.  He had elevated lactic acid level and there was concern initially that he might be septic but that was ruled out.  He was initially started on IV antibiotics and these were discontinued when it appeared that he did not have any infection.  He was found to have acute on chronic heart failure and was treated with intravenous diuresis.  He improved over the next several days to the point that he was back at baseline.  He was -4.6 L.  His Lasix dose has been increased.  He had a Foley catheter which what has now been removed he will continue with tube feedings.  He has thyroid cancer and has tube feedings because of the difficulty with swallowing and he is undergoing rehab to see if he can be improved enough to get treatment of his thyroid cancer.  He has COPD at baseline and budesonide was added to his nebulizer treatments  Discharge Exam: Blood pressure (!) 116/52, pulse (!) 59, temperature 97.8 F (36.6 C), temperature source Oral, resp. rate 18, height 5\' 9"  (1.753 m), weight 89  kg (196 lb 3.4 oz), SpO2 98 %. He is awake and alert.  His chest is clear.  His heart is regular.  Abdomen is soft PEG tube looks okay.  No edema  Disposition: To skilled care facility for rehabilitation      Signed: Raychel Dowler L   03/25/2018, 10:14 AM

## 2018-03-25 NOTE — Progress Notes (Signed)
Patient will Discharge To: [facility] Anticipated DC Date:03/25/18 Family Notified:yes, Pearlie Oyster [phone] Transport By: [facility]   Per MD patient ready for DC to [facility] . RN, patient, patient's family, and facility notified of DC. Assessment, Fl2/Pasrr, and Discharge Summary sent to facility. RN given number for report [phone]). DC packet on chart. Ambulance transport requested for patient.   CSW signing off.  Reed Breech LCSWA [phone]

## 2018-03-25 NOTE — Plan of Care (Signed)
progressing 

## 2018-03-25 NOTE — Progress Notes (Signed)
On assessment there was not a foley catheter

## 2018-03-25 NOTE — Progress Notes (Signed)
Subjective: He feels much better.  He is back to baseline as far as his breathing is concerned.  No new complaints.  Objective: Vital signs in last 24 hours: Temp:  [97.8 F (36.6 C)-98.3 F (36.8 C)] 97.8 F (36.6 C) (04/27 0541) Pulse Rate:  [59-87] 59 (04/27 0541) Resp:  [18] 18 (04/27 0541) BP: (105-116)/(39-52) 116/52 (04/27 0541) SpO2:  [96 %-99 %] 98 % (04/27 0757) Weight:  [89 kg (196 lb 3.4 oz)] 89 kg (196 lb 3.4 oz) (04/27 0541) Weight change:  Last BM Date: 03/23/18  Intake/Output from previous day: 04/26 0701 - 04/27 0700 In: 1500 [NG/GT:1500] Out: 1350 [Urine:1350]  PHYSICAL EXAM General appearance: alert, cooperative and no distress Resp: clear to auscultation bilaterally Cardio: regular rate and rhythm, S1, S2 normal, no murmur, click, rub or gallop GI: soft, non-tender; bowel sounds normal; no masses,  no organomegaly Extremities: extremities normal, atraumatic, no cyanosis or edema Skin warm and dry  Lab Results:  Results for orders placed or performed during the hospital encounter of 03/19/18 (from the past 48 hour(s))  Glucose, capillary     Status: Abnormal   Collection Time: 03/23/18 11:02 AM  Result Value Ref Range   Glucose-Capillary 117 (H) 65 - 99 mg/dL  Glucose, capillary     Status: Abnormal   Collection Time: 03/23/18  4:46 PM  Result Value Ref Range   Glucose-Capillary 118 (H) 65 - 99 mg/dL  Glucose, capillary     Status: Abnormal   Collection Time: 03/23/18  8:48 PM  Result Value Ref Range   Glucose-Capillary 103 (H) 65 - 99 mg/dL   Comment 1 Notify RN    Comment 2 Document in Chart   Basic metabolic panel     Status: Abnormal   Collection Time: 03/24/18  5:21 AM  Result Value Ref Range   Sodium 134 (L) 135 - 145 mmol/L   Potassium 3.7 3.5 - 5.1 mmol/L   Chloride 89 (L) 101 - 111 mmol/L   CO2 31 22 - 32 mmol/L   Glucose, Bld 112 (H) 65 - 99 mg/dL   BUN 59 (H) 6 - 20 mg/dL   Creatinine, Ser 1.05 0.61 - 1.24 mg/dL   Calcium 9.5 8.9  - 10.3 mg/dL   GFR calc non Af Amer >60 >60 mL/min   GFR calc Af Amer >60 >60 mL/min    Comment: (NOTE) The eGFR has been calculated using the CKD EPI equation. This calculation has not been validated in all clinical situations. eGFR's persistently <60 mL/min signify possible Chronic Kidney Disease.    Anion gap 14 5 - 15    Comment: Performed at [facility], [address] [place], [place] [address]  Magnesium     Status: None   Collection Time: 03/24/18  5:21 AM  Result Value Ref Range   Magnesium 2.2 1.7 - 2.4 mg/dL    Comment: Performed at [facility], [address] [place], [place] [address]  Glucose, capillary     Status: Abnormal   Collection Time: 03/24/18  7:45 AM  Result Value Ref Range   Glucose-Capillary 106 (H) 65 - 99 mg/dL   Comment 1 Notify RN    Comment 2 Document in Chart   Glucose, capillary     Status: Abnormal   Collection Time: 03/24/18 11:19 AM  Result Value Ref Range   Glucose-Capillary 130 (H) 65 - 99 mg/dL   Comment 1 Notify RN    Comment 2 Document in Chart   Glucose, capillary  Status: None   Collection Time: 03/24/18  8:49 PM  Result Value Ref Range   Glucose-Capillary 80 65 - 99 mg/dL  Glucose, capillary     Status: Abnormal   Collection Time: 03/25/18  7:20 AM  Result Value Ref Range   Glucose-Capillary 101 (H) 65 - 99 mg/dL   Comment 1 Notify RN    Comment 2 Document in Chart     ABGS No results for input(s): PHART, PO2ART, TCO2, HCO3 in the last 72 hours.  Invalid input(s): PCO2 CULTURES Recent Results (from the past 240 hour(s))  Blood Culture (routine x 2)     Status: None   Collection Time: 03/19/18  3:06 PM  Result Value Ref Range Status   Specimen Description BLOOD RIGHT FOREARM  Final   Special Requests   Final    ANAEROBIC BOTTLE ONLY Blood Culture results may not be optimal due to an inadequate volume of blood received in culture bottles   Culture   Final    NO GROWTH 5 DAYS Performed at [facility], [address], [place], [place] [address]    Report Status 03/24/2018 FINAL  Final  Blood Culture (routine x 2)     Status: None   Collection Time: 03/19/18  3:15 PM  Result Value Ref Range Status   Specimen Description BLOOD RIGHT HAND  Final   Special Requests   Final    BOTTLES DRAWN AEROBIC ONLY Blood Culture results may not be optimal due to an inadequate volume of blood received in culture bottles   Culture   Final    NO GROWTH 5 DAYS Performed at [facility], [address] [place], [place] [address]    Report Status 03/24/2018 FINAL  Final  MRSA PCR Screening     Status: None   Collection Time: 03/19/18  8:08 PM  Result Value Ref Range Status   MRSA by PCR NEGATIVE NEGATIVE Final    Comment:        The GeneXpert MRSA Assay (FDA approved for NASAL specimens only), is one component of a comprehensive MRSA colonization surveillance program. It is not intended to diagnose MRSA infection nor to guide or monitor treatment for MRSA infections. Performed at [facility], [address] [place], [place] [address]    Studies/Results: No results found.  Medications:  Prior to Admission:  Medications Prior to Admission  Medication Sig Dispense Refill Last Dose  . Amino Acids-Protein Hydrolys (FEEDING SUPPLEMENT, PRO-STAT SUGAR FREE 64,) LIQD Place 30 mLs into feeding tube 2 (two) times daily. 900 mL 0 03/19/2018 at Unknown time  . apixaban (ELIQUIS) 5 MG TABS tablet Take 1 tablet (5 mg total) by mouth 2 (two) times daily. Ok to crush with oral food ( NOT PEG) 60 tablet 3 03/19/2018 at 8am  . cefTRIAXone (ROCEPHIN) 1 g injection Inject 1 g into the muscle once. For infection   03/16/2018  . doxycycline (VIBRAMYCIN) 100 MG capsule Take 100 mg by mouth 2 (two) times daily.   03/19/2018 at Unknown time  . folic acid (FOLVITE) 1 MG tablet Take 1 tablet (1 mg total) by mouth daily.   03/19/2018 at Unknown time  . furosemide (LASIX) 10 mg/mL SOLN Inject 40 mg into the vein once. Intramuscularly one time  for CHF for 1 day   03/16/2018  . gabapentin (NEURONTIN) 300 MG capsule Take 600 mg by mouth 2 (two) times daily.   03/19/2018 at Unknown time  . ipratropium-albuterol (DUONEB) 0.5-2.5 (3) MG/3ML SOLN Take 3  mLs by nebulization every 8 (eight) hours. For 2 days   03/17/2018  . levothyroxine (SYNTHROID, LEVOTHROID) 75 MCG tablet Take 1 tablet (75 mcg total) by mouth daily before breakfast.   03/19/2018 at Unknown time  . Melatonin 5 MG CAPS Take 10 capsules by mouth at bedtime.   03/18/2018 at Unknown time  . metoprolol tartrate (LOPRESSOR) 50 MG tablet Take 1 tablet (50 mg total) by mouth 2 (two) times daily.   03/19/2018 at 8am  . mirtazapine (REMERON) 15 MG tablet Take 1 tablet (15 mg total) by mouth at bedtime.   03/18/2018 at Unknown time  . Nutritional Supplements (FEEDING SUPPLEMENT, JEVITY 1.2 CAL,) LIQD Place 1,000 mLs into feeding tube continuous.  0 03/19/2018 at Unknown time  . omeprazole (PRILOSEC) 20 MG capsule Take 1 capsule (20 mg total) by mouth 2 (two) times daily before a meal.   03/19/2018 at Unknown time  . simvastatin (ZOCOR) 40 MG tablet Take 0.5 tablets (20 mg total) by mouth at bedtime. 30 tablet 12 03/18/2018 at Unknown time  . traMADol (ULTRAM) 50 MG tablet Take 50 mg by mouth every 6 (six) hours as needed.   03/17/2018  . Water For Irrigation, Sterile (FREE WATER) SOLN Place 200 mLs into feeding tube 4 (four) times daily.   03/19/2018 at Unknown time  . white petrolatum (VASELINE) GEL Apply 1 application topically as needed for lip care.  0 03/19/2018 at Unknown time  . albuterol (PROVENTIL HFA) 108 (90 Base) MCG/ACT inhaler Inhale 2 puffs into the lungs every 6 (six) hours as needed for wheezing or shortness of breath. 1 Inhaler 3 unknown  . diltiazem (CARDIZEM CD) 240 MG 24 hr capsule HOLD until seen by PCP 90 capsule 3   . furosemide (LASIX) 20 MG tablet HOLD until seen by PCP 90 tablet 3 03/10/2018  . lisinopril (PRINIVIL,ZESTRIL) 40 MG tablet HOLD until seen by PCP 90 tablet 3  unknown  . Maltodextrin-Xanthan Gum (RESOURCE THICKENUP CLEAR) POWD Take 240 g by mouth as needed.   unknown  . nitroGLYCERIN (NITROSTAT) 0.4 MG SL tablet Place 1 tablet (0.4 mg total) under the tongue every 5 (five) minutes as needed for chest pain. 30 tablet 9 unknown   Scheduled: . apixaban  5 mg Per Tube BID  . budesonide (PULMICORT) nebulizer solution  0.5 mg Nebulization BID  . chlorhexidine  15 mL Mouth Rinse BID  . diphenhydrAMINE  25 mg Oral Once  . feeding supplement (PRO-STAT SUGAR FREE 64)  30 mL Per Tube BID  . folic acid  1 mg Per Tube Daily  . free water  200 mL Per Tube QID  . furosemide  40 mg Oral Daily  . gabapentin  300 mg Per Tube QHS  . insulin aspart  0-9 Units Subcutaneous TID WC  . ipratropium-albuterol  3 mL Nebulization TID  . levothyroxine  75 mcg Per Tube QAC breakfast  . mouth rinse  15 mL Mouth Rinse q12n4p  . metoprolol tartrate  12.5 mg Per Tube BID  . mirtazapine  15 mg Per Tube QHS  . multivitamin  15 mL Per Tube Daily  . nutrition supplement (JUVEN)  1 packet Per Tube BID BM  . pantoprazole sodium  40 mg Per Tube Daily  . potassium chloride  20 mEq Oral BID  . simvastatin  20 mg Per Tube QHS  . sodium chloride flush  3 mL Intravenous Q12H   Continuous: . sodium chloride    . feeding supplement (JEVITY 1.2 CAL)  1,000 mL (03/24/18 1835)   MVV:KPQAES chloride, acetaminophen, ipratropium-albuterol, morphine injection, nitroGLYCERIN, ondansetron (ZOFRAN) IV, sodium chloride flush  Assesment: He was admitted with acute on chronic hypoxic respiratory failure felt to be related to acute on chronic diastolic heart failure.  He is much improved.  His lactic acid level was elevated and there was concern initially that he might be septic but this was ruled out.  He has COPD at baseline and that is stable.  He has paroxysmal atrial fib which is stable.  He has thyroid cancer and he is undergoing rehabilitation to try to get him stronger so he can undergo  further treatment.  He is ready for discharge Active Problems:   Essential hypertension, benign   COPD (chronic obstructive pulmonary disease) (HCC)   PAF (paroxysmal atrial fibrillation) (HCC)   Acute on chronic diastolic CHF (congestive heart failure) (HCC)   Thyroid cancer (HCC)   CAD (coronary artery disease)   Hypothyroidism (acquired)   Acute on chronic respiratory failure with hypoxia (HCC)   Elevated troponin   Lactic acid acidosis   Pressure injury of skin    Plan: Discharge to skilled care facility today    LOS: 6 days   John Sampson L 03/25/2018, 9:59 AM

## 2018-03-25 NOTE — Progress Notes (Addendum)
When [facility] showed up to get the pt, they had to wake him up. The head of his bead was at about 20 degrees. He said that he could not breath. I checked his SATS. He was at 97%. I called the NT, RT, and Dr.Tat. RT gave patient a breathing treatment. Dr. Carles Collet checked pt and decided to keep pt overnight. I called [facility] to tell them that the pt would not be going to [facility] today.

## 2018-03-26 LAB — GLUCOSE, CAPILLARY
GLUCOSE-CAPILLARY: 111 mg/dL — AB (ref 65–99)
Glucose-Capillary: 137 mg/dL — ABNORMAL HIGH (ref 65–99)
Glucose-Capillary: 149 mg/dL — ABNORMAL HIGH (ref 65–99)
Glucose-Capillary: 139 mg/dL — ABNORMAL HIGH (ref 65–99)

## 2018-03-26 MED ORDER — FUROSEMIDE 40 MG PO TABS
40.0000 mg | ORAL_TABLET | Freq: Every day | ORAL | Status: DC
  Administered 2018-03-26 – 2018-03-28 (×3): 40 mg
  Filled 2018-03-26 (×3): qty 1

## 2018-03-26 MED ORDER — POTASSIUM CHLORIDE 20 MEQ/15ML (10%) PO SOLN
20.0000 meq | Freq: Two times a day (BID) | ORAL | Status: DC
  Administered 2018-03-26 – 2018-03-28 (×5): 20 meq
  Filled 2018-03-26 (×5): qty 30

## 2018-03-26 NOTE — Progress Notes (Signed)
Subjective: He started having increased shortness of breath right before he was to be transferred to the skilled care facility.  His transfer was canceled and he was started on more aggressive treatment for COPD.  It did not appear that he had increase in heart failure.  He says he feels somewhat better this morning.  Objective: Vital signs in last 24 hours: Temp:  [97.6 F (36.4 C)-98.4 F (36.9 C)] 98.4 F (36.9 C) (04/28 0540) Pulse Rate:  [62-84] 62 (04/28 0540) Resp:  [18-22] 20 (04/28 0540) BP: (89-122)/(44-66) 122/66 (04/28 0540) SpO2:  [95 %-100 %] 98 % (04/28 0820) Weight:  [83.7 kg (184 lb 8.4 oz)] 83.7 kg (184 lb 8.4 oz) (04/28 0540) Weight change: 0 kg (0 lb) Last BM Date: 03/23/18  Intake/Output from previous day: 04/27 0701 - 04/28 0700 In: 1096 [I.V.:6; NG/GT:1010] Out: 1375 [Urine:1375]  PHYSICAL EXAM General appearance: alert, cooperative and no distress Resp: Decreased breath sounds and prolonged expiratory phase Cardio: regular rate and rhythm, S1, S2 normal, no murmur, click, rub or gallop GI: soft, non-tender; bowel sounds normal; no masses,  no organomegaly Extremities: extremities normal, atraumatic, no cyanosis or edema  Lab Results:  Results for orders placed or performed during the hospital encounter of 03/19/18 (from the past 48 hour(s))  Glucose, capillary     Status: Abnormal   Collection Time: 03/24/18 11:19 AM  Result Value Ref Range   Glucose-Capillary 130 (H) 65 - 99 mg/dL   Comment 1 Notify RN    Comment 2 Document in Chart   Glucose, capillary     Status: None   Collection Time: 03/24/18  8:49 PM  Result Value Ref Range   Glucose-Capillary 80 65 - 99 mg/dL  Glucose, capillary     Status: Abnormal   Collection Time: 03/25/18  7:20 AM  Result Value Ref Range   Glucose-Capillary 101 (H) 65 - 99 mg/dL   Comment 1 Notify RN    Comment 2 Document in Chart   Glucose, capillary     Status: Abnormal   Collection Time: 03/25/18 11:17 AM   Result Value Ref Range   Glucose-Capillary 120 (H) 65 - 99 mg/dL   Comment 1 Notify RN    Comment 2 Document in Chart   Basic metabolic panel     Status: Abnormal   Collection Time: 03/25/18  3:24 PM  Result Value Ref Range   Sodium 136 135 - 145 mmol/L   Potassium 3.9 3.5 - 5.1 mmol/L   Chloride 91 (L) 101 - 111 mmol/L   CO2 32 22 - 32 mmol/L   Glucose, Bld 114 (H) 65 - 99 mg/dL   BUN 57 (H) 6 - 20 mg/dL   Creatinine, Ser 1.03 0.61 - 1.24 mg/dL   Calcium 9.3 8.9 - 10.3 mg/dL   GFR calc non Af Amer >60 >60 mL/min   GFR calc Af Amer >60 >60 mL/min    Comment: (NOTE) The eGFR has been calculated using the CKD EPI equation. This calculation has not been validated in all clinical situations. eGFR's persistently <60 mL/min signify possible Chronic Kidney Disease.    Anion gap 13 5 - 15    Comment: Performed at [facility], [address] [place], [place] [address]  CBC     Status: Abnormal   Collection Time: 03/25/18  3:24 PM  Result Value Ref Range   WBC 17.8 (H) 4.0 - 10.5 K/uL   RBC 3.48 (L) 4.22 - 5.81 MIL/uL   Hemoglobin 9.3 (  L) 13.0 - 17.0 g/dL   HCT 32.0 (L) 39.0 - 52.0 %   MCV 92.0 78.0 - 100.0 fL   MCH 26.7 26.0 - 34.0 pg   MCHC 29.1 (L) 30.0 - 36.0 g/dL   RDW 18.1 (H) 11.5 - 15.5 %   Platelets 347 150 - 400 K/uL    Comment: Performed at [facility], [address] [place], [place] [address]  Glucose, capillary     Status: Abnormal   Collection Time: 03/25/18  4:18 PM  Result Value Ref Range   Glucose-Capillary 100 (H) 65 - 99 mg/dL   Comment 1 Notify RN    Comment 2 Document in Chart   Glucose, capillary     Status: Abnormal   Collection Time: 03/25/18  9:05 PM  Result Value Ref Range   Glucose-Capillary 154 (H) 65 - 99 mg/dL   Comment 1 Notify RN    Comment 2 Document in Chart   Glucose, capillary     Status: Abnormal   Collection Time: 03/26/18  7:24 AM  Result Value Ref Range   Glucose-Capillary 137 (H) 65 - 99 mg/dL   Comment 1 Notify RN    Comment  2 Document in Chart     ABGS No results for input(s): PHART, PO2ART, TCO2, HCO3 in the last 72 hours.  Invalid input(s): PCO2 CULTURES Recent Results (from the past 240 hour(s))  Blood Culture (routine x 2)     Status: None   Collection Time: 03/19/18  3:06 PM  Result Value Ref Range Status   Specimen Description BLOOD RIGHT FOREARM  Final   Special Requests   Final    ANAEROBIC BOTTLE ONLY Blood Culture results may not be optimal due to an inadequate volume of blood received in culture bottles   Culture   Final    NO GROWTH 5 DAYS Performed at [facility], [address], [place], [place] [address]    Report Status 03/24/2018 FINAL  Final  Blood Culture (routine x 2)     Status: None   Collection Time: 03/19/18  3:15 PM  Result Value Ref Range Status   Specimen Description BLOOD RIGHT HAND  Final   Special Requests   Final    BOTTLES DRAWN AEROBIC ONLY Blood Culture results may not be optimal due to an inadequate volume of blood received in culture bottles   Culture   Final    NO GROWTH 5 DAYS Performed at [facility], [address] [place], [place] [address]    Report Status 03/24/2018 FINAL  Final  MRSA PCR Screening     Status: None   Collection Time: 03/19/18  8:08 PM  Result Value Ref Range Status   MRSA by PCR NEGATIVE NEGATIVE Final    Comment:        The GeneXpert MRSA Assay (FDA approved for NASAL specimens only), is one component of a comprehensive MRSA colonization surveillance program. It is not intended to diagnose MRSA infection nor to guide or monitor treatment for MRSA infections. Performed at [facility], [address], [place], [place] [address]    Studies/Results: Dg Chest Port 1 View  Result Date: 03/25/2018 CLINICAL DATA:  Dyspnea. EXAM: PORTABLE CHEST 1 VIEW COMPARISON:  March 19, 2018 FINDINGS: The heart, hila, and mediastinum are normal. No pneumothorax. No nodules or masses. No focal infiltrates. Elevation the right hemidiaphragm is  stable. No overt edema. IMPRESSION: No active disease. Electronically Signed   By: Dorise Bullion III M.D   On:  03/25/2018 15:54    Medications:  Prior to Admission:  Medications Prior to Admission  Medication Sig Dispense Refill Last Dose  . Amino Acids-Protein Hydrolys (FEEDING SUPPLEMENT, PRO-STAT SUGAR FREE 64,) LIQD Place 30 mLs into feeding tube 2 (two) times daily. 900 mL 0 03/19/2018 at Unknown time  . apixaban (ELIQUIS) 5 MG TABS tablet Take 1 tablet (5 mg total) by mouth 2 (two) times daily. Ok to crush with oral food ( NOT PEG) 60 tablet 3 03/19/2018 at 8am  . cefTRIAXone (ROCEPHIN) 1 g injection Inject 1 g into the muscle once. For infection   03/16/2018  . doxycycline (VIBRAMYCIN) 100 MG capsule Take 100 mg by mouth 2 (two) times daily.   03/19/2018 at Unknown time  . folic acid (FOLVITE) 1 MG tablet Take 1 tablet (1 mg total) by mouth daily.   03/19/2018 at Unknown time  . furosemide (LASIX) 10 mg/mL SOLN Inject 40 mg into the vein once. Intramuscularly one time for CHF for 1 day   03/16/2018  . gabapentin (NEURONTIN) 300 MG capsule Take 600 mg by mouth 2 (two) times daily.   03/19/2018 at Unknown time  . ipratropium-albuterol (DUONEB) 0.5-2.5 (3) MG/3ML SOLN Take 3 mLs by nebulization every 8 (eight) hours. For 2 days   03/17/2018  . levothyroxine (SYNTHROID, LEVOTHROID) 75 MCG tablet Take 1 tablet (75 mcg total) by mouth daily before breakfast.   03/19/2018 at Unknown time  . Melatonin 5 MG CAPS Take 10 capsules by mouth at bedtime.   03/18/2018 at Unknown time  . metoprolol tartrate (LOPRESSOR) 50 MG tablet Take 1 tablet (50 mg total) by mouth 2 (two) times daily.   03/19/2018 at 8am  . mirtazapine (REMERON) 15 MG tablet Take 1 tablet (15 mg total) by mouth at bedtime.   03/18/2018 at Unknown time  . Nutritional Supplements (FEEDING SUPPLEMENT, JEVITY 1.2 CAL,) LIQD Place 1,000 mLs into feeding tube continuous.  0 03/19/2018 at Unknown time  . omeprazole (PRILOSEC) 20 MG capsule Take 1  capsule (20 mg total) by mouth 2 (two) times daily before a meal.   03/19/2018 at Unknown time  . simvastatin (ZOCOR) 40 MG tablet Take 0.5 tablets (20 mg total) by mouth at bedtime. 30 tablet 12 03/18/2018 at Unknown time  . traMADol (ULTRAM) 50 MG tablet Take 50 mg by mouth every 6 (six) hours as needed.   03/17/2018  . Water For Irrigation, Sterile (FREE WATER) SOLN Place 200 mLs into feeding tube 4 (four) times daily.   03/19/2018 at Unknown time  . white petrolatum (VASELINE) GEL Apply 1 application topically as needed for lip care.  0 03/19/2018 at Unknown time  . albuterol (PROVENTIL HFA) 108 (90 Base) MCG/ACT inhaler Inhale 2 puffs into the lungs every 6 (six) hours as needed for wheezing or shortness of breath. 1 Inhaler 3 unknown  . diltiazem (CARDIZEM CD) 240 MG 24 hr capsule HOLD until seen by PCP 90 capsule 3   . furosemide (LASIX) 20 MG tablet HOLD until seen by PCP 90 tablet 3 03/10/2018  . lisinopril (PRINIVIL,ZESTRIL) 40 MG tablet HOLD until seen by PCP 90 tablet 3 unknown  . Maltodextrin-Xanthan Gum (RESOURCE THICKENUP CLEAR) POWD Take 240 g by mouth as needed.   unknown  . nitroGLYCERIN (NITROSTAT) 0.4 MG SL tablet Place 1 tablet (0.4 mg total) under the tongue every 5 (five) minutes as needed for chest pain. 30 tablet 9 unknown   Scheduled: . apixaban  5 mg Per Tube BID  . arformoterol  15 mcg Nebulization BID  . budesonide (PULMICORT) nebulizer solution  0.5 mg Nebulization BID  . chlorhexidine  15 mL Mouth Rinse BID  . diphenhydrAMINE  25 mg Oral Once  . feeding supplement (PRO-STAT SUGAR FREE 64)  30 mL Per Tube BID  . folic acid  1 mg Per Tube Daily  . free water  200 mL Per Tube QID  . furosemide  40 mg Per Tube Daily  . gabapentin  300 mg Per Tube QHS  . insulin aspart  0-9 Units Subcutaneous TID WC  . ipratropium-albuterol  3 mL Nebulization TID  . levothyroxine  75 mcg Per Tube QAC breakfast  . mouth rinse  15 mL Mouth Rinse q12n4p  . methylPREDNISolone (SOLU-MEDROL)  injection  60 mg Intravenous Q24H  . metoprolol tartrate  12.5 mg Per Tube BID  . mirtazapine  15 mg Per Tube QHS  . multivitamin  15 mL Per Tube Daily  . nutrition supplement (JUVEN)  1 packet Per Tube BID BM  . pantoprazole sodium  40 mg Per Tube Daily  . potassium chloride  20 mEq Per Tube BID  . simvastatin  20 mg Per Tube QHS  . sodium chloride flush  3 mL Intravenous Q12H   Continuous: . sodium chloride    . feeding supplement (JEVITY 1.2 CAL) 1,000 mL (03/25/18 1036)   LKT:GYBWLS chloride, acetaminophen, ipratropium-albuterol, morphine injection, nitroGLYCERIN, ondansetron (ZOFRAN) IV, sodium chloride flush  Assesment: He was admitted with acute on chronic hypoxic respiratory failure.  Initially there was concern that he was septic but that has been ruled out.  He was found to have acute on chronic diastolic heart failure which was treated with diuresis.  This has improved.  He was being readied for transfer back to his skilled care facility for rehab when he developed increasing shortness of breath felt to be COPD exacerbation.  He is being treated for that and has improved. Active Problems:   Essential hypertension, benign   COPD (chronic obstructive pulmonary disease) (HCC)   PAF (paroxysmal atrial fibrillation) (HCC)   Acute on chronic diastolic CHF (congestive heart failure) (HCC)   Thyroid cancer (HCC)   CAD (coronary artery disease)   Hypothyroidism (acquired)   Acute on chronic respiratory failure with hypoxia (HCC)   Elevated troponin   Lactic acid acidosis   Pressure injury of skin   Dyspnea    Plan: Continue treatments.  Reevaluate regarding transfer in the morning    LOS: 7 days   Marshay Slates L 03/26/2018, 9:24 AM

## 2018-03-26 NOTE — Progress Notes (Signed)
CSW spoke with RN Estill Dooms concerning pt's discharge.  Pt will probably discharge on Monday according to RN [place].  CSW will continue to follow.  Reed Breech LCSWA [phone]

## 2018-03-27 LAB — GLUCOSE, CAPILLARY
Glucose-Capillary: 124 mg/dL — ABNORMAL HIGH (ref 65–99)
Glucose-Capillary: 121 mg/dL — ABNORMAL HIGH (ref 65–99)
Glucose-Capillary: 124 mg/dL — ABNORMAL HIGH (ref 65–99)
Glucose-Capillary: 165 mg/dL — ABNORMAL HIGH (ref 65–99)

## 2018-03-27 MED ORDER — FLUTICASONE FUROATE-VILANTEROL 100-25 MCG/INH IN AEPB
1.0000 | INHALATION_SPRAY | Freq: Every day | RESPIRATORY_TRACT | Status: DC
  Filled 2018-03-27: qty 28

## 2018-03-27 MED ORDER — IPRATROPIUM-ALBUTEROL 0.5-2.5 (3) MG/3ML IN SOLN
3.0000 mL | RESPIRATORY_TRACT | Status: DC
  Administered 2018-03-27 – 2018-03-28 (×5): 3 mL via RESPIRATORY_TRACT
  Filled 2018-03-27 (×5): qty 3

## 2018-03-27 NOTE — Clinical Social Work Note (Signed)
LCSW following. Reviewed pt's record this AM. Per MD, pt not yet stable for dc. Plan remains for return to [facility] when stable. Updated Debbie at [employer]. Will follow up tomorrow.

## 2018-03-27 NOTE — Care Management Important Message (Signed)
Important Message  Patient Details  Name: John Sampson MRN: [id number] Date of Birth: 04-Sep-1936   Medicare Important Message Given:  Yes    Shelda Altes 03/27/2018, 11:59 AM

## 2018-03-27 NOTE — Progress Notes (Signed)
Pt had a 13 beat run of SVT. Vitals obtained, WNL. Pt in no distress. MD made aware, stated it has been in his hx. Wanted me to continue to monitor the patient.

## 2018-03-27 NOTE — Progress Notes (Signed)
Subjective: He says he does not feel well enough to be transferred to the skilled care facility today.  He is still short of breath.  Objective: Vital signs in last 24 hours: Temp:  [97.4 F (36.3 C)-98.3 F (36.8 C)] 97.4 F (36.3 C) (04/29 0336) Pulse Rate:  [50-61] 50 (04/29 0336) Resp:  [16-22] 18 (04/29 0336) BP: (103-121)/(31-50) 121/50 (04/29 0336) SpO2:  [97 %-100 %] 99 % (04/29 0736) Weight:  [84.2 kg (185 lb 10 oz)] 84.2 kg (185 lb 10 oz) (04/29 0336) Weight change: 0.5 kg (1 lb 1.6 oz) Last BM Date: 03/23/18  Intake/Output from previous day: 04/28 0701 - 04/29 0700 In: 1977.9 [I.V.:6; NG/GT:1871.9] Out: 1400 [Urine:1400]  PHYSICAL EXAM General appearance: alert, cooperative and no distress Resp: rhonchi bilaterally Cardio: regular rate and rhythm, S1, S2 normal, no murmur, click, rub or gallop GI: soft, non-tender; bowel sounds normal; no masses,  no organomegaly Extremities: extremities normal, atraumatic, no cyanosis or edema  Lab Results:  Results for orders placed or performed during the hospital encounter of 03/19/18 (from the past 48 hour(s))  Glucose, capillary     Status: Abnormal   Collection Time: 03/25/18 11:17 AM  Result Value Ref Range   Glucose-Capillary 120 (H) 65 - 99 mg/dL   Comment 1 Notify RN    Comment 2 Document in Chart   Basic metabolic panel     Status: Abnormal   Collection Time: 03/25/18  3:24 PM  Result Value Ref Range   Sodium 136 135 - 145 mmol/L   Potassium 3.9 3.5 - 5.1 mmol/L   Chloride 91 (L) 101 - 111 mmol/L   CO2 32 22 - 32 mmol/L   Glucose, Bld 114 (H) 65 - 99 mg/dL   BUN 57 (H) 6 - 20 mg/dL   Creatinine, Ser 1.03 0.61 - 1.24 mg/dL   Calcium 9.3 8.9 - 10.3 mg/dL   GFR calc non Af Amer >60 >60 mL/min   GFR calc Af Amer >60 >60 mL/min    Comment: (NOTE) The eGFR has been calculated using the CKD EPI equation. This calculation has not been validated in all clinical situations. eGFR's persistently <60 mL/min signify  possible Chronic Kidney Disease.    Anion gap 13 5 - 15    Comment: Performed at [facility], [address], [place], [place] [address]  CBC     Status: Abnormal   Collection Time: 03/25/18  3:24 PM  Result Value Ref Range   WBC 17.8 (H) 4.0 - 10.5 K/uL   RBC 3.48 (L) 4.22 - 5.81 MIL/uL   Hemoglobin 9.3 (L) 13.0 - 17.0 g/dL   HCT 32.0 (L) 39.0 - 52.0 %   MCV 92.0 78.0 - 100.0 fL   MCH 26.7 26.0 - 34.0 pg   MCHC 29.1 (L) 30.0 - 36.0 g/dL   RDW 18.1 (H) 11.5 - 15.5 %   Platelets 347 150 - 400 K/uL    Comment: Performed at [facility], [address] [place], [place] [address]  Glucose, capillary     Status: Abnormal   Collection Time: 03/25/18  4:18 PM  Result Value Ref Range   Glucose-Capillary 100 (H) 65 - 99 mg/dL   Comment 1 Notify RN    Comment 2 Document in Chart   Glucose, capillary     Status: Abnormal   Collection Time: 03/25/18  9:05 PM  Result Value Ref Range   Glucose-Capillary 154 (H) 65 - 99 mg/dL   Comment 1 Notify RN  Comment 2 Document in Chart   Glucose, capillary     Status: Abnormal   Collection Time: 03/26/18  7:24 AM  Result Value Ref Range   Glucose-Capillary 137 (H) 65 - 99 mg/dL   Comment 1 Notify RN    Comment 2 Document in Chart   Glucose, capillary     Status: Abnormal   Collection Time: 03/26/18 11:15 AM  Result Value Ref Range   Glucose-Capillary 149 (H) 65 - 99 mg/dL   Comment 1 Notify RN    Comment 2 Document in Chart   Glucose, capillary     Status: Abnormal   Collection Time: 03/26/18  4:22 PM  Result Value Ref Range   Glucose-Capillary 111 (H) 65 - 99 mg/dL   Comment 1 Notify RN    Comment 2 Document in Chart   Glucose, capillary     Status: Abnormal   Collection Time: 03/26/18  9:05 PM  Result Value Ref Range   Glucose-Capillary 139 (H) 65 - 99 mg/dL  Glucose, capillary     Status: Abnormal   Collection Time: 03/27/18  7:43 AM  Result Value Ref Range   Glucose-Capillary 124 (H) 65 - 99 mg/dL   Comment 1 Notify RN     Comment 2 Document in Chart     ABGS No results for input(s): PHART, PO2ART, TCO2, HCO3 in the last 72 hours.  Invalid input(s): PCO2 CULTURES Recent Results (from the past 240 hour(s))  Blood Culture (routine x 2)     Status: None   Collection Time: 03/19/18  3:06 PM  Result Value Ref Range Status   Specimen Description BLOOD RIGHT FOREARM  Final   Special Requests   Final    ANAEROBIC BOTTLE ONLY Blood Culture results may not be optimal due to an inadequate volume of blood received in culture bottles   Culture   Final    NO GROWTH 5 DAYS Performed at [facility], [address] [place], [place] [address]    Report Status 03/24/2018 FINAL  Final  Blood Culture (routine x 2)     Status: None   Collection Time: 03/19/18  3:15 PM  Result Value Ref Range Status   Specimen Description BLOOD RIGHT HAND  Final   Special Requests   Final    BOTTLES DRAWN AEROBIC ONLY Blood Culture results may not be optimal due to an inadequate volume of blood received in culture bottles   Culture   Final    NO GROWTH 5 DAYS Performed at [facility], [address], [place], [place] [address]    Report Status 03/24/2018 FINAL  Final  MRSA PCR Screening     Status: None   Collection Time: 03/19/18  8:08 PM  Result Value Ref Range Status   MRSA by PCR NEGATIVE NEGATIVE Final    Comment:        The GeneXpert MRSA Assay (FDA approved for NASAL specimens only), is one component of a comprehensive MRSA colonization surveillance program. It is not intended to diagnose MRSA infection nor to guide or monitor treatment for MRSA infections. Performed at [facility], [address], [place],  [address]    Studies/Results: Dg Chest Port 1 View  Result Date: 03/25/2018 CLINICAL DATA:  Dyspnea. EXAM: PORTABLE CHEST 1 VIEW COMPARISON:  March 19, 2018 FINDINGS: The heart, hila, and mediastinum are normal. No pneumothorax. No nodules or masses. No focal infiltrates. Elevation the right hemidiaphragm  is stable. No overt edema. IMPRESSION: No active disease. Electronically Signed  By: Dorise Bullion III M.D   On: 03/25/2018 15:54    Medications:  Prior to Admission:  Medications Prior to Admission  Medication Sig Dispense Refill Last Dose  . Amino Acids-Protein Hydrolys (FEEDING SUPPLEMENT, PRO-STAT SUGAR FREE 64,) LIQD Place 30 mLs into feeding tube 2 (two) times daily. 900 mL 0 03/19/2018 at Unknown time  . apixaban (ELIQUIS) 5 MG TABS tablet Take 1 tablet (5 mg total) by mouth 2 (two) times daily. Ok to crush with oral food ( NOT PEG) 60 tablet 3 03/19/2018 at 8am  . cefTRIAXone (ROCEPHIN) 1 g injection Inject 1 g into the muscle once. For infection   03/16/2018  . doxycycline (VIBRAMYCIN) 100 MG capsule Take 100 mg by mouth 2 (two) times daily.   03/19/2018 at Unknown time  . folic acid (FOLVITE) 1 MG tablet Take 1 tablet (1 mg total) by mouth daily.   03/19/2018 at Unknown time  . furosemide (LASIX) 10 mg/mL SOLN Inject 40 mg into the vein once. Intramuscularly one time for CHF for 1 day   03/16/2018  . gabapentin (NEURONTIN) 300 MG capsule Take 600 mg by mouth 2 (two) times daily.   03/19/2018 at Unknown time  . ipratropium-albuterol (DUONEB) 0.5-2.5 (3) MG/3ML SOLN Take 3 mLs by nebulization every 8 (eight) hours. For 2 days   03/17/2018  . levothyroxine (SYNTHROID, LEVOTHROID) 75 MCG tablet Take 1 tablet (75 mcg total) by mouth daily before breakfast.   03/19/2018 at Unknown time  . Melatonin 5 MG CAPS Take 10 capsules by mouth at bedtime.   03/18/2018 at Unknown time  . metoprolol tartrate (LOPRESSOR) 50 MG tablet Take 1 tablet (50 mg total) by mouth 2 (two) times daily.   03/19/2018 at 8am  . mirtazapine (REMERON) 15 MG tablet Take 1 tablet (15 mg total) by mouth at bedtime.   03/18/2018 at Unknown time  . Nutritional Supplements (FEEDING SUPPLEMENT, JEVITY 1.2 CAL,) LIQD Place 1,000 mLs into feeding tube continuous.  0 03/19/2018 at Unknown time  . omeprazole (PRILOSEC) 20 MG capsule Take 1  capsule (20 mg total) by mouth 2 (two) times daily before a meal.   03/19/2018 at Unknown time  . simvastatin (ZOCOR) 40 MG tablet Take 0.5 tablets (20 mg total) by mouth at bedtime. 30 tablet 12 03/18/2018 at Unknown time  . traMADol (ULTRAM) 50 MG tablet Take 50 mg by mouth every 6 (six) hours as needed.   03/17/2018  . Water For Irrigation, Sterile (FREE WATER) SOLN Place 200 mLs into feeding tube 4 (four) times daily.   03/19/2018 at Unknown time  . white petrolatum (VASELINE) GEL Apply 1 application topically as needed for lip care.  0 03/19/2018 at Unknown time  . albuterol (PROVENTIL HFA) 108 (90 Base) MCG/ACT inhaler Inhale 2 puffs into the lungs every 6 (six) hours as needed for wheezing or shortness of breath. 1 Inhaler 3 unknown  . diltiazem (CARDIZEM CD) 240 MG 24 hr capsule HOLD until seen by PCP 90 capsule 3   . furosemide (LASIX) 20 MG tablet HOLD until seen by PCP 90 tablet 3 03/10/2018  . lisinopril (PRINIVIL,ZESTRIL) 40 MG tablet HOLD until seen by PCP 90 tablet 3 unknown  . Maltodextrin-Xanthan Gum (RESOURCE THICKENUP CLEAR) POWD Take 240 g by mouth as needed.   unknown  . nitroGLYCERIN (NITROSTAT) 0.4 MG SL tablet Place 1 tablet (0.4 mg total) under the tongue every 5 (five) minutes as needed for chest pain. 30 tablet 9 unknown   Scheduled: . apixaban  5 mg Per Tube BID  . arformoterol  15 mcg Nebulization BID  . chlorhexidine  15 mL Mouth Rinse BID  . diphenhydrAMINE  25 mg Oral Once  . feeding supplement (PRO-STAT SUGAR FREE 64)  30 mL Per Tube BID  . fluticasone furoate-vilanterol  1 puff Inhalation Daily  . folic acid  1 mg Per Tube Daily  . free water  200 mL Per Tube QID  . furosemide  40 mg Per Tube Daily  . gabapentin  300 mg Per Tube QHS  . insulin aspart  0-9 Units Subcutaneous TID WC  . ipratropium-albuterol  3 mL Nebulization Q4H  . levothyroxine  75 mcg Per Tube QAC breakfast  . mouth rinse  15 mL Mouth Rinse q12n4p  . methylPREDNISolone (SOLU-MEDROL) injection   60 mg Intravenous Q24H  . metoprolol tartrate  12.5 mg Per Tube BID  . mirtazapine  15 mg Per Tube QHS  . multivitamin  15 mL Per Tube Daily  . nutrition supplement (JUVEN)  1 packet Per Tube BID BM  . pantoprazole sodium  40 mg Per Tube Daily  . potassium chloride  20 mEq Per Tube BID  . simvastatin  20 mg Per Tube QHS  . sodium chloride flush  3 mL Intravenous Q12H   Continuous: . sodium chloride    . feeding supplement (JEVITY 1.2 CAL) 1,000 mL (03/25/18 1036)   WNU:UVOZDG chloride, acetaminophen, ipratropium-albuterol, morphine injection, nitroGLYCERIN, ondansetron (ZOFRAN) IV, sodium chloride flush  Assesment: He was admitted with shortness of breath.  This appeared to be mostly from acute on chronic diastolic heart failure.  He seems to be having more of a COPD exacerbation now.  He is still short of breath. Active Problems:   Essential hypertension, benign   COPD (chronic obstructive pulmonary disease) (HCC)   PAF (paroxysmal atrial fibrillation) (HCC)   Acute on chronic diastolic CHF (congestive heart failure) (HCC)   Thyroid cancer (HCC)   CAD (coronary artery disease)   Hypothyroidism (acquired)   Acute on chronic respiratory failure with hypoxia (HCC)   Elevated troponin   Lactic acid acidosis   Pressure injury of skin   Dyspnea    Plan: Hold on discharge.  Start Breo.  Adjust his nebulizer treatments    LOS: 8 days   Bari Handshoe L 03/27/2018, 9:04 AM

## 2018-03-28 DIAGNOSIS — R402441 Other coma, without documented Glasgow coma scale score, or with partial score reported, in the field [EMT or ambulance]: Secondary | ICD-10-CM | POA: Diagnosis not present

## 2018-03-28 DIAGNOSIS — F05 Delirium due to known physiological condition: Secondary | ICD-10-CM | POA: Diagnosis not present

## 2018-03-28 DIAGNOSIS — C7800 Secondary malignant neoplasm of unspecified lung: Secondary | ICD-10-CM | POA: Diagnosis not present

## 2018-03-28 DIAGNOSIS — J449 Chronic obstructive pulmonary disease, unspecified: Secondary | ICD-10-CM | POA: Diagnosis not present

## 2018-03-28 DIAGNOSIS — R29810 Facial weakness: Secondary | ICD-10-CM | POA: Diagnosis present

## 2018-03-28 DIAGNOSIS — C7931 Secondary malignant neoplasm of brain: Secondary | ICD-10-CM | POA: Diagnosis present

## 2018-03-28 DIAGNOSIS — K5900 Constipation, unspecified: Secondary | ICD-10-CM | POA: Diagnosis not present

## 2018-03-28 DIAGNOSIS — L89610 Pressure ulcer of right heel, unstageable: Secondary | ICD-10-CM | POA: Diagnosis present

## 2018-03-28 DIAGNOSIS — I5033 Acute on chronic diastolic (congestive) heart failure: Secondary | ICD-10-CM | POA: Diagnosis present

## 2018-03-28 DIAGNOSIS — R21 Rash and other nonspecific skin eruption: Secondary | ICD-10-CM | POA: Diagnosis not present

## 2018-03-28 DIAGNOSIS — E875 Hyperkalemia: Secondary | ICD-10-CM | POA: Diagnosis not present

## 2018-03-28 DIAGNOSIS — Z66 Do not resuscitate: Secondary | ICD-10-CM | POA: Diagnosis present

## 2018-03-28 DIAGNOSIS — B029 Zoster without complications: Secondary | ICD-10-CM | POA: Diagnosis not present

## 2018-03-28 DIAGNOSIS — I252 Old myocardial infarction: Secondary | ICD-10-CM | POA: Diagnosis not present

## 2018-03-28 DIAGNOSIS — R748 Abnormal levels of other serum enzymes: Secondary | ICD-10-CM | POA: Diagnosis present

## 2018-03-28 DIAGNOSIS — C7951 Secondary malignant neoplasm of bone: Secondary | ICD-10-CM | POA: Diagnosis not present

## 2018-03-28 DIAGNOSIS — C778 Secondary and unspecified malignant neoplasm of lymph nodes of multiple regions: Secondary | ICD-10-CM | POA: Diagnosis not present

## 2018-03-28 DIAGNOSIS — F17210 Nicotine dependence, cigarettes, uncomplicated: Secondary | ICD-10-CM | POA: Diagnosis not present

## 2018-03-28 DIAGNOSIS — I4892 Unspecified atrial flutter: Secondary | ICD-10-CM | POA: Diagnosis present

## 2018-03-28 DIAGNOSIS — K219 Gastro-esophageal reflux disease without esophagitis: Secondary | ICD-10-CM | POA: Diagnosis present

## 2018-03-28 DIAGNOSIS — J438 Other emphysema: Secondary | ICD-10-CM | POA: Diagnosis not present

## 2018-03-28 DIAGNOSIS — G629 Polyneuropathy, unspecified: Secondary | ICD-10-CM | POA: Diagnosis not present

## 2018-03-28 DIAGNOSIS — I5032 Chronic diastolic (congestive) heart failure: Secondary | ICD-10-CM | POA: Diagnosis not present

## 2018-03-28 DIAGNOSIS — J3800 Paralysis of vocal cords and larynx, unspecified: Secondary | ICD-10-CM | POA: Diagnosis present

## 2018-03-28 DIAGNOSIS — Z515 Encounter for palliative care: Secondary | ICD-10-CM | POA: Diagnosis not present

## 2018-03-28 DIAGNOSIS — K922 Gastrointestinal hemorrhage, unspecified: Secondary | ICD-10-CM | POA: Diagnosis not present

## 2018-03-28 DIAGNOSIS — I618 Other nontraumatic intracerebral hemorrhage: Secondary | ICD-10-CM | POA: Diagnosis not present

## 2018-03-28 DIAGNOSIS — R402362 Coma scale, best motor response, obeys commands, at arrival to emergency department: Secondary | ICD-10-CM | POA: Diagnosis present

## 2018-03-28 DIAGNOSIS — E039 Hypothyroidism, unspecified: Secondary | ICD-10-CM | POA: Diagnosis not present

## 2018-03-28 DIAGNOSIS — Z79899 Other long term (current) drug therapy: Secondary | ICD-10-CM | POA: Diagnosis not present

## 2018-03-28 DIAGNOSIS — R262 Difficulty in walking, not elsewhere classified: Secondary | ICD-10-CM | POA: Diagnosis not present

## 2018-03-28 DIAGNOSIS — R634 Abnormal weight loss: Secondary | ICD-10-CM | POA: Diagnosis not present

## 2018-03-28 DIAGNOSIS — I480 Paroxysmal atrial fibrillation: Secondary | ICD-10-CM | POA: Diagnosis not present

## 2018-03-28 DIAGNOSIS — I509 Heart failure, unspecified: Secondary | ICD-10-CM | POA: Diagnosis not present

## 2018-03-28 DIAGNOSIS — Z8711 Personal history of peptic ulcer disease: Secondary | ICD-10-CM | POA: Diagnosis not present

## 2018-03-28 DIAGNOSIS — G936 Cerebral edema: Secondary | ICD-10-CM | POA: Diagnosis present

## 2018-03-28 DIAGNOSIS — D649 Anemia, unspecified: Secondary | ICD-10-CM | POA: Diagnosis not present

## 2018-03-28 DIAGNOSIS — R2681 Unsteadiness on feet: Secondary | ICD-10-CM | POA: Diagnosis not present

## 2018-03-28 DIAGNOSIS — E43 Unspecified severe protein-calorie malnutrition: Secondary | ICD-10-CM | POA: Diagnosis present

## 2018-03-28 DIAGNOSIS — R0602 Shortness of breath: Secondary | ICD-10-CM | POA: Diagnosis not present

## 2018-03-28 DIAGNOSIS — M6281 Muscle weakness (generalized): Secondary | ICD-10-CM | POA: Diagnosis not present

## 2018-03-28 DIAGNOSIS — R946 Abnormal results of thyroid function studies: Secondary | ICD-10-CM | POA: Diagnosis not present

## 2018-03-28 DIAGNOSIS — L0390 Cellulitis, unspecified: Secondary | ICD-10-CM | POA: Diagnosis not present

## 2018-03-28 DIAGNOSIS — L89620 Pressure ulcer of left heel, unstageable: Secondary | ICD-10-CM | POA: Diagnosis present

## 2018-03-28 DIAGNOSIS — D509 Iron deficiency anemia, unspecified: Secondary | ICD-10-CM | POA: Diagnosis not present

## 2018-03-28 DIAGNOSIS — C7989 Secondary malignant neoplasm of other specified sites: Secondary | ICD-10-CM | POA: Diagnosis not present

## 2018-03-28 DIAGNOSIS — R1314 Dysphagia, pharyngoesophageal phase: Secondary | ICD-10-CM | POA: Diagnosis not present

## 2018-03-28 DIAGNOSIS — R402142 Coma scale, eyes open, spontaneous, at arrival to emergency department: Secondary | ICD-10-CM | POA: Diagnosis present

## 2018-03-28 DIAGNOSIS — E890 Postprocedural hypothyroidism: Secondary | ICD-10-CM | POA: Diagnosis not present

## 2018-03-28 DIAGNOSIS — R531 Weakness: Secondary | ICD-10-CM | POA: Diagnosis not present

## 2018-03-28 DIAGNOSIS — C787 Secondary malignant neoplasm of liver and intrahepatic bile duct: Secondary | ICD-10-CM | POA: Diagnosis not present

## 2018-03-28 DIAGNOSIS — I4891 Unspecified atrial fibrillation: Secondary | ICD-10-CM | POA: Diagnosis not present

## 2018-03-28 DIAGNOSIS — Q000 Anencephaly: Secondary | ICD-10-CM | POA: Diagnosis not present

## 2018-03-28 DIAGNOSIS — R509 Fever, unspecified: Secondary | ICD-10-CM | POA: Diagnosis not present

## 2018-03-28 DIAGNOSIS — I10 Essential (primary) hypertension: Secondary | ICD-10-CM | POA: Diagnosis not present

## 2018-03-28 DIAGNOSIS — Z7901 Long term (current) use of anticoagulants: Secondary | ICD-10-CM | POA: Diagnosis not present

## 2018-03-28 DIAGNOSIS — C73 Malignant neoplasm of thyroid gland: Secondary | ICD-10-CM | POA: Diagnosis not present

## 2018-03-28 DIAGNOSIS — Z743 Need for continuous supervision: Secondary | ICD-10-CM | POA: Diagnosis not present

## 2018-03-28 DIAGNOSIS — J9621 Acute and chronic respiratory failure with hypoxia: Secondary | ICD-10-CM | POA: Diagnosis present

## 2018-03-28 DIAGNOSIS — L97429 Non-pressure chronic ulcer of left heel and midfoot with unspecified severity: Secondary | ICD-10-CM | POA: Diagnosis not present

## 2018-03-28 DIAGNOSIS — J9611 Chronic respiratory failure with hypoxia: Secondary | ICD-10-CM | POA: Diagnosis not present

## 2018-03-28 DIAGNOSIS — I110 Hypertensive heart disease with heart failure: Secondary | ICD-10-CM | POA: Diagnosis present

## 2018-03-28 DIAGNOSIS — Z951 Presence of aortocoronary bypass graft: Secondary | ICD-10-CM | POA: Diagnosis not present

## 2018-03-28 DIAGNOSIS — L89623 Pressure ulcer of left heel, stage 3: Secondary | ICD-10-CM | POA: Diagnosis not present

## 2018-03-28 DIAGNOSIS — J3801 Paralysis of vocal cords and larynx, unilateral: Secondary | ICD-10-CM | POA: Diagnosis not present

## 2018-03-28 DIAGNOSIS — I619 Nontraumatic intracerebral hemorrhage, unspecified: Secondary | ICD-10-CM | POA: Diagnosis not present

## 2018-03-28 DIAGNOSIS — K228 Other specified diseases of esophagus: Secondary | ICD-10-CM | POA: Diagnosis not present

## 2018-03-28 DIAGNOSIS — Z931 Gastrostomy status: Secondary | ICD-10-CM | POA: Diagnosis not present

## 2018-03-28 DIAGNOSIS — J441 Chronic obstructive pulmonary disease with (acute) exacerbation: Secondary | ICD-10-CM | POA: Diagnosis not present

## 2018-03-28 DIAGNOSIS — I2510 Atherosclerotic heart disease of native coronary artery without angina pectoris: Secondary | ICD-10-CM | POA: Diagnosis present

## 2018-03-28 DIAGNOSIS — R9431 Abnormal electrocardiogram [ECG] [EKG]: Secondary | ICD-10-CM | POA: Diagnosis not present

## 2018-03-28 LAB — GLUCOSE, CAPILLARY: Glucose-Capillary: 164 mg/dL — ABNORMAL HIGH (ref 65–99)

## 2018-03-28 MED ORDER — POTASSIUM CHLORIDE 20 MEQ/15ML (10%) PO SOLN: 20.0000 meq | Freq: Two times a day (BID) | ORAL | 0 refills | Status: AC

## 2018-03-28 MED ORDER — PREDNISONE 10 MG (21) PO TBPK: ORAL_TABLET | ORAL | Status: DC

## 2018-03-28 MED ORDER — ARFORMOTEROL TARTRATE 15 MCG/2ML IN NEBU: 15.0000 ug | INHALATION_SOLUTION | Freq: Two times a day (BID) | RESPIRATORY_TRACT | Status: AC

## 2018-03-28 NOTE — Progress Notes (Signed)
IV removed, WNL. D/C instructions given to pt. Report to be called to [facility]. EMS present to pick up patient.

## 2018-03-28 NOTE — Progress Notes (Signed)
Subjective: He says he feels better today.  He is ready to go back to the nursing home.  I discussed all this by phone with his son Konrad Dolores who has power of attorney.  I expressed my concern that his father may not get well enough to do any treatment of his thyroid cancer.  I expressed that to Mr. Lacinda Axon as well  Objective: Vital signs in last 24 hours: Temp:  [97.4 F (36.3 C)-98.4 F (36.9 C)] 97.4 F (36.3 C) (04/30 0639) Pulse Rate:  [51-65] 58 (04/30 0639) Resp:  [16-22] 16 (04/29 2127) BP: (112-124)/(41-50) 124/42 (04/30 0639) SpO2:  [96 %-100 %] 97 % (04/30 0742) Weight:  [83.5 kg (184 lb 1.4 oz)] 83.5 kg (184 lb 1.4 oz) (04/30 0500) Weight change: -0.7 kg (-1 lb 8.7 oz) Last BM Date: 03/27/18  Intake/Output from previous day: 04/29 0701 - 04/30 0700 In: 2745 [NG/GT:1695] Out: 2450 [Urine:2450]  PHYSICAL EXAM General appearance: alert, cooperative and no distress Resp: clear to auscultation bilaterally Cardio: regular rate and rhythm, S1, S2 normal, no murmur, click, rub or gallop GI: soft, non-tender; bowel sounds normal; no masses,  no organomegaly Extremities: extremities normal, atraumatic, no cyanosis or edema  Lab Results:  Results for orders placed or performed during the hospital encounter of 03/19/18 (from the past 48 hour(s))  Glucose, capillary     Status: Abnormal   Collection Time: 03/26/18 11:15 AM  Result Value Ref Range   Glucose-Capillary 149 (H) 65 - 99 mg/dL   Comment 1 Notify RN    Comment 2 Document in Chart   Glucose, capillary     Status: Abnormal   Collection Time: 03/26/18  4:22 PM  Result Value Ref Range   Glucose-Capillary 111 (H) 65 - 99 mg/dL   Comment 1 Notify RN    Comment 2 Document in Chart   Glucose, capillary     Status: Abnormal   Collection Time: 03/26/18  9:05 PM  Result Value Ref Range   Glucose-Capillary 139 (H) 65 - 99 mg/dL  Glucose, capillary     Status: Abnormal   Collection Time: 03/27/18  7:43 AM  Result Value Ref  Range   Glucose-Capillary 124 (H) 65 - 99 mg/dL   Comment 1 Notify RN    Comment 2 Document in Chart   Glucose, capillary     Status: Abnormal   Collection Time: 03/27/18 11:27 AM  Result Value Ref Range   Glucose-Capillary 121 (H) 65 - 99 mg/dL   Comment 1 Notify RN    Comment 2 Document in Chart   Glucose, capillary     Status: Abnormal   Collection Time: 03/27/18  4:41 PM  Result Value Ref Range   Glucose-Capillary 124 (H) 65 - 99 mg/dL   Comment 1 Notify RN    Comment 2 Document in Chart   Glucose, capillary     Status: Abnormal   Collection Time: 03/27/18  9:26 PM  Result Value Ref Range   Glucose-Capillary 165 (H) 65 - 99 mg/dL  Glucose, capillary     Status: Abnormal   Collection Time: 03/28/18  7:30 AM  Result Value Ref Range   Glucose-Capillary 164 (H) 65 - 99 mg/dL   Comment 1 Notify RN    Comment 2 Document in Chart     ABGS No results for input(s): PHART, PO2ART, TCO2, HCO3 in the last 72 hours.  Invalid input(s): PCO2 CULTURES Recent Results (from the past 240 hour(s))  Blood Culture (routine x 2)  Status: None   Collection Time: 03/19/18  3:06 PM  Result Value Ref Range Status   Specimen Description BLOOD RIGHT FOREARM  Final   Special Requests   Final    ANAEROBIC BOTTLE ONLY Blood Culture results may not be optimal due to an inadequate volume of blood received in culture bottles   Culture   Final    NO GROWTH 5 DAYS Performed at [facility], [address], [place], [place] [address]    Report Status 03/24/2018 FINAL  Final  Blood Culture (routine x 2)     Status: None   Collection Time: 03/19/18  3:15 PM  Result Value Ref Range Status   Specimen Description BLOOD RIGHT HAND  Final   Special Requests   Final    BOTTLES DRAWN AEROBIC ONLY Blood Culture results may not be optimal due to an inadequate volume of blood received in culture bottles   Culture   Final    NO GROWTH 5 DAYS Performed at [facility], [address] [place], [place]  [address]    Report Status 03/24/2018 FINAL  Final  MRSA PCR Screening     Status: None   Collection Time: 03/19/18  8:08 PM  Result Value Ref Range Status   MRSA by PCR NEGATIVE NEGATIVE Final    Comment:        The GeneXpert MRSA Assay (FDA approved for NASAL specimens only), is one component of a comprehensive MRSA colonization surveillance program. It is not intended to diagnose MRSA infection nor to guide or monitor treatment for MRSA infections. Performed at [facility], [address], [place],  [address]    Studies/Results: No results found.  Medications:  Prior to Admission:  Medications Prior to Admission  Medication Sig Dispense Refill Last Dose  . Amino Acids-Protein Hydrolys (FEEDING SUPPLEMENT, PRO-STAT SUGAR FREE 64,) LIQD Place 30 mLs into feeding tube 2 (two) times daily. 900 mL 0 03/19/2018 at Unknown time  . apixaban (ELIQUIS) 5 MG TABS tablet Take 1 tablet (5 mg total) by mouth 2 (two) times daily. Ok to crush with oral food ( NOT PEG) 60 tablet 3 03/19/2018 at 8am  . cefTRIAXone (ROCEPHIN) 1 g injection Inject 1 g into the muscle once. For infection   03/16/2018  . doxycycline (VIBRAMYCIN) 100 MG capsule Take 100 mg by mouth 2 (two) times daily.   03/19/2018 at Unknown time  . folic acid (FOLVITE) 1 MG tablet Take 1 tablet (1 mg total) by mouth daily.   03/19/2018 at Unknown time  . furosemide (LASIX) 10 mg/mL SOLN Inject 40 mg into the vein once. Intramuscularly one time for CHF for 1 day   03/16/2018  . gabapentin (NEURONTIN) 300 MG capsule Take 600 mg by mouth 2 (two) times daily.   03/19/2018 at Unknown time  . ipratropium-albuterol (DUONEB) 0.5-2.5 (3) MG/3ML SOLN Take 3 mLs by nebulization every 8 (eight) hours. For 2 days   03/17/2018  . levothyroxine (SYNTHROID, LEVOTHROID) 75 MCG tablet Take 1 tablet (75 mcg total) by mouth daily before breakfast.   03/19/2018 at Unknown time  . Melatonin 5 MG CAPS Take 10 capsules by mouth at bedtime.   03/18/2018 at  Unknown time  . metoprolol tartrate (LOPRESSOR) 50 MG tablet Take 1 tablet (50 mg total) by mouth 2 (two) times daily.   03/19/2018 at 8am  . mirtazapine (REMERON) 15 MG tablet Take 1 tablet (15 mg total) by mouth at bedtime.   03/18/2018 at Unknown time  . Nutritional  Supplements (FEEDING SUPPLEMENT, JEVITY 1.2 CAL,) LIQD Place 1,000 mLs into feeding tube continuous.  0 03/19/2018 at Unknown time  . omeprazole (PRILOSEC) 20 MG capsule Take 1 capsule (20 mg total) by mouth 2 (two) times daily before a meal.   03/19/2018 at Unknown time  . simvastatin (ZOCOR) 40 MG tablet Take 0.5 tablets (20 mg total) by mouth at bedtime. 30 tablet 12 03/18/2018 at Unknown time  . traMADol (ULTRAM) 50 MG tablet Take 50 mg by mouth every 6 (six) hours as needed.   03/17/2018  . Water For Irrigation, Sterile (FREE WATER) SOLN Place 200 mLs into feeding tube 4 (four) times daily.   03/19/2018 at Unknown time  . white petrolatum (VASELINE) GEL Apply 1 application topically as needed for lip care.  0 03/19/2018 at Unknown time  . albuterol (PROVENTIL HFA) 108 (90 Base) MCG/ACT inhaler Inhale 2 puffs into the lungs every 6 (six) hours as needed for wheezing or shortness of breath. 1 Inhaler 3 unknown  . diltiazem (CARDIZEM CD) 240 MG 24 hr capsule HOLD until seen by PCP 90 capsule 3   . furosemide (LASIX) 20 MG tablet HOLD until seen by PCP 90 tablet 3 03/10/2018  . lisinopril (PRINIVIL,ZESTRIL) 40 MG tablet HOLD until seen by PCP 90 tablet 3 unknown  . Maltodextrin-Xanthan Gum (RESOURCE THICKENUP CLEAR) POWD Take 240 g by mouth as needed.   unknown  . nitroGLYCERIN (NITROSTAT) 0.4 MG SL tablet Place 1 tablet (0.4 mg total) under the tongue every 5 (five) minutes as needed for chest pain. 30 tablet 9 unknown   Scheduled: . apixaban  5 mg Per Tube BID  . arformoterol  15 mcg Nebulization BID  . chlorhexidine  15 mL Mouth Rinse BID  . diphenhydrAMINE  25 mg Oral Once  . feeding supplement (PRO-STAT SUGAR FREE 64)  30 mL Per Tube  BID  . fluticasone furoate-vilanterol  1 puff Inhalation Daily  . folic acid  1 mg Per Tube Daily  . free water  200 mL Per Tube QID  . furosemide  40 mg Per Tube Daily  . gabapentin  300 mg Per Tube QHS  . insulin aspart  0-9 Units Subcutaneous TID WC  . ipratropium-albuterol  3 mL Nebulization Q4H  . levothyroxine  75 mcg Per Tube QAC breakfast  . mouth rinse  15 mL Mouth Rinse q12n4p  . methylPREDNISolone (SOLU-MEDROL) injection  60 mg Intravenous Q24H  . metoprolol tartrate  12.5 mg Per Tube BID  . mirtazapine  15 mg Per Tube QHS  . multivitamin  15 mL Per Tube Daily  . nutrition supplement (JUVEN)  1 packet Per Tube BID BM  . pantoprazole sodium  40 mg Per Tube Daily  . potassium chloride  20 mEq Per Tube BID  . simvastatin  20 mg Per Tube QHS  . sodium chloride flush  3 mL Intravenous Q12H   Continuous: . sodium chloride    . feeding supplement (JEVITY 1.2 CAL) 65 mL/hr at 03/28/18 0500   PNT:IRWERX chloride, acetaminophen, ipratropium-albuterol, morphine injection, nitroGLYCERIN, ondansetron (ZOFRAN) IV, sodium chloride flush  Assesment: He was admitted with acute on chronic hypoxic respiratory failure and acute on chronic diastolic heart failure.  He also had COPD exacerbation this admission.  He has thyroid cancer and he is undergoing rehab to try to get him strong enough to undergo treatment.  He is better and ready to go back to the skilled care facility for rehab Active Problems:   Essential hypertension, benign  COPD (chronic obstructive pulmonary disease) (HCC)   PAF (paroxysmal atrial fibrillation) (HCC)   Acute on chronic diastolic CHF (congestive heart failure) (HCC)   Thyroid cancer (HCC)   CAD (coronary artery disease)   Hypothyroidism (acquired)   Acute on chronic respiratory failure with hypoxia (HCC)   Elevated troponin   Lactic acid acidosis   Pressure injury of skin   Dyspnea    Plan: Transfer back to skilled care facility for rehab    LOS: 9  days   Muriel Wilber L 03/28/2018, 8:11 AM

## 2018-03-28 NOTE — Discharge Summary (Signed)
Physician Discharge Summary  Patient ID: John Sampson MRN: [id number] DOB/AGE: [birth year] 82 y.o. Primary Care Physician:Sadiq Mccauley, Percell Miller, MD Admit date: 03/19/2018 Discharge date: 03/28/2018    Discharge Diagnoses:   Active Problems:   Essential hypertension, benign   COPD (chronic obstructive pulmonary disease) (HCC)   PAF (paroxysmal atrial fibrillation) (HCC)   Acute on chronic diastolic CHF (congestive heart failure) (HCC)   Thyroid cancer (HCC)   CAD (coronary artery disease)   Hypothyroidism (acquired)   Acute on chronic respiratory failure with hypoxia (HCC)   Elevated troponin   Lactic acid acidosis   Pressure injury of skin   Dyspnea COPD exacerbation  Allergies as of 03/28/2018      Reactions   Morphine And Related Nausea And Vomiting      Medication List    STOP taking these medications   albuterol 108 (90 Base) MCG/ACT inhaler Commonly known as:  PROVENTIL HFA Replaced by:  albuterol (2.5 MG/3ML) 0.083% nebulizer solution   cefTRIAXone 1 g injection Commonly known as:  ROCEPHIN   diltiazem 240 MG 24 hr capsule Commonly known as:  CARDIZEM CD   doxycycline 100 MG capsule Commonly known as:  VIBRAMYCIN   feeding supplement (PRO-STAT SUGAR FREE 64) Liqd   gabapentin 300 MG capsule Commonly known as:  NEURONTIN Replaced by:  gabapentin 250 MG/5ML solution   lisinopril 40 MG tablet Commonly known as:  PRINIVIL,ZESTRIL   Melatonin 5 MG Caps   omeprazole 20 MG capsule Commonly known as:  PRILOSEC   RESOURCE THICKENUP CLEAR Powd   white petrolatum Gel Commonly known as:  VASELINE     TAKE these medications   acetaminophen 325 MG tablet Commonly known as:  TYLENOL Place 2 tablets (650 mg total) into feeding tube every 4 (four) hours as needed for headache or mild pain.   albuterol (2.5 MG/3ML) 0.083% nebulizer solution Commonly known as:  PROVENTIL Take 3 mLs (2.5 mg total) by nebulization every 6 (six) hours as needed for wheezing or  shortness of breath. Replaces:  albuterol 108 (90 Base) MCG/ACT inhaler   apixaban 5 MG Tabs tablet Commonly known as:  ELIQUIS Place 1 tablet (5 mg total) into feeding tube 2 (two) times daily. What changed:    how to take this  additional instructions   arformoterol 15 MCG/2ML Nebu Commonly known as:  BROVANA Take 2 mLs (15 mcg total) by nebulization 2 (two) times daily.   budesonide 0.5 MG/2ML nebulizer solution Commonly known as:  PULMICORT Take 2 mLs (0.5 mg total) by nebulization 2 (two) times daily.   feeding supplement (JEVITY 1.2 CAL) Liqd Place 1,000 mLs into feeding tube continuous. What changed:  Another medication with the same name was added. Make sure you understand how and when to take each.   nutrition supplement (JUVEN) Pack Place 1 packet into feeding tube 2 (two) times daily between meals. What changed:  You were already taking a medication with the same name, and this prescription was added. Make sure you understand how and when to take each.   folic acid 1 MG tablet Commonly known as:  FOLVITE Place 1 tablet (1 mg total) into feeding tube daily. What changed:  how to take this   free water Soln Place 200 mLs into feeding tube 4 (four) times daily.   furosemide 40 MG tablet Commonly known as:  LASIX 1 per tube daily What changed:    medication strength  additional instructions  Another medication with the same name was removed. Continue taking  this medication, and follow the directions you see here.   gabapentin 250 MG/5ML solution Commonly known as:  NEURONTIN Place 6 mLs (300 mg total) into feeding tube at bedtime. Replaces:  gabapentin 300 MG capsule   ipratropium-albuterol 0.5-2.5 (3) MG/3ML Soln Commonly known as:  DUONEB Take 3 mLs by nebulization 3 (three) times daily. What changed:    when to take this  additional instructions   levothyroxine 75 MCG tablet Commonly known as:  SYNTHROID, LEVOTHROID Place 1 tablet (75 mcg  total) into feeding tube daily before breakfast. What changed:  how to take this   metoprolol tartrate 25 MG tablet Commonly known as:  LOPRESSOR Place 0.5 tablets (12.5 mg total) into feeding tube 2 (two) times daily. What changed:    medication strength  how much to take  how to take this   mirtazapine 15 MG tablet Commonly known as:  REMERON Place 1 tablet (15 mg total) into feeding tube at bedtime. What changed:  how to take this   multivitamin Liqd Place 15 mLs into feeding tube daily.   nitroGLYCERIN 0.4 MG SL tablet Commonly known as:  NITROSTAT Place 1 tablet (0.4 mg total) under the tongue every 5 (five) minutes as needed for chest pain (contact MD if use needed). What changed:  reasons to take this   pantoprazole sodium 40 mg/20 mL Pack Commonly known as:  PROTONIX Place 20 mLs (40 mg total) into feeding tube daily.   potassium chloride 20 MEQ/15ML (10%) Soln 20 mEq per tube daily   potassium chloride 20 MEQ/15ML (10%) Soln Place 15 mLs (20 mEq total) into feeding tube 2 (two) times daily.   predniSONE 10 MG (21) Tbpk tablet Commonly known as:  STERAPRED UNI-PAK 21 TAB Take by package instructions   simvastatin 20 MG tablet Commonly known as:  ZOCOR Place 1 tablet (20 mg total) into feeding tube at bedtime. What changed:    medication strength  how to take this   traMADol 50 MG tablet Commonly known as:  ULTRAM Take 50 mg by mouth every 6 (six) hours as needed.       Discharged Condition: Improved    Consults: Cardiology  Significant Diagnostic Studies: Dg Chest Port 1 View  Result Date: 03/25/2018 CLINICAL DATA:  Dyspnea. EXAM: PORTABLE CHEST 1 VIEW COMPARISON:  March 19, 2018 FINDINGS: The heart, hila, and mediastinum are normal. No pneumothorax. No nodules or masses. No focal infiltrates. Elevation the right hemidiaphragm is stable. No overt edema. IMPRESSION: No active disease. Electronically Signed   By: Dorise Bullion III M.D   On:  03/25/2018 15:54   Dg Chest Portable 1 View  Result Date: 03/19/2018 CLINICAL DATA:  Difficulty breathing for 1 hour with hypoxia EXAM: PORTABLE CHEST 1 VIEW COMPARISON:  02/22/2018 FINDINGS: Cardiac shadow is stable from the prior exam. Postsurgical changes are noted. Vascular congestion is now seen with mild parenchymal edema. No sizable effusion is noted. No bony abnormality is noted. IMPRESSION: Changes of mild CHF. Electronically Signed   By: Inez Catalina M.D.   On: 03/19/2018 15:47    Lab Results: Basic Metabolic Panel: Recent Labs    03/25/18 1524  NA 136  K 3.9  CL 91*  CO2 32  GLUCOSE 114*  BUN 57*  CREATININE 1.03  CALCIUM 9.3   Liver Function Tests: No results for input(s): AST, ALT, ALKPHOS, BILITOT, PROT, ALBUMIN in the last 72 hours.   CBC: Recent Labs    03/25/18 1524  WBC 17.8*  HGB  9.3*  HCT 32.0*  MCV 92.0  PLT 347    Recent Results (from the past 240 hour(s))  Blood Culture (routine x 2)     Status: None   Collection Time: 03/19/18  3:06 PM  Result Value Ref Range Status   Specimen Description BLOOD RIGHT FOREARM  Final   Special Requests   Final    ANAEROBIC BOTTLE ONLY Blood Culture results may not be optimal due to an inadequate volume of blood received in culture bottles   Culture   Final    NO GROWTH 5 DAYS Performed at [facility], [address], [place], [place] [address]    Report Status 03/24/2018 FINAL  Final  Blood Culture (routine x 2)     Status: None   Collection Time: 03/19/18  3:15 PM  Result Value Ref Range Status   Specimen Description BLOOD RIGHT HAND  Final   Special Requests   Final    BOTTLES DRAWN AEROBIC ONLY Blood Culture results may not be optimal due to an inadequate volume of blood received in culture bottles   Culture   Final    NO GROWTH 5 DAYS Performed at [facility], [address], [place], [place] [address]    Report Status 03/24/2018 FINAL  Final  MRSA PCR Screening     Status: None   Collection Time:  03/19/18  8:08 PM  Result Value Ref Range Status   MRSA by PCR NEGATIVE NEGATIVE Final    Comment:        The GeneXpert MRSA Assay (FDA approved for NASAL specimens only), is one component of a comprehensive MRSA colonization surveillance program. It is not intended to diagnose MRSA infection nor to guide or monitor treatment for MRSA infections. Performed at [facility], [address], [place], [place] [address]      Hospital Course: This is an 82 year old who came to the emergency department because of shortness of breath.  He has been at a skilled care facility doing rehab.  He was recently discovered to have recurrence of thyroid cancer he has a feeding tube in place but he was very weak and it was felt that he was not a candidate for any treatment of his thyroid cancer until he improved.  He developed increasing shortness of breath and initially was thought it might be septic.  This was ruled out and he was stopped as far as antibiotics are concerned.  He had acute on chronic diastolic heart failure which was treated with diuresis and improved.  He was approaching discharge and actually was ready for discharge to the skilled care facility when he developed shortness of breath.  He appeared to have COPD exacerbation which was treated with steroids inhaled bronchodilators.  He improved again and was ready for discharge on the 30th.  Discharge Exam: Blood pressure (!) 124/42, pulse (!) 58, temperature (!) 97.4 F (36.3 C), temperature source Oral, resp. rate 16, height 5\' 9"  (1.753 m), weight 83.5 kg (184 lb 1.4 oz), SpO2 97 %. He is awake and alert.  He is hoarse.  He looks weak.  His chest is clear.  Disposition: To skilled care facility for rehab      Signed: Nitara Szczerba L   03/28/2018, 8:23 AM

## 2018-03-29 DIAGNOSIS — I509 Heart failure, unspecified: Secondary | ICD-10-CM | POA: Diagnosis not present

## 2018-03-29 DIAGNOSIS — I10 Essential (primary) hypertension: Secondary | ICD-10-CM | POA: Diagnosis not present

## 2018-03-29 DIAGNOSIS — J449 Chronic obstructive pulmonary disease, unspecified: Secondary | ICD-10-CM | POA: Diagnosis not present

## 2018-03-29 DIAGNOSIS — G629 Polyneuropathy, unspecified: Secondary | ICD-10-CM | POA: Diagnosis not present

## 2018-04-03 DIAGNOSIS — E875 Hyperkalemia: Secondary | ICD-10-CM | POA: Diagnosis not present

## 2018-04-03 DIAGNOSIS — J449 Chronic obstructive pulmonary disease, unspecified: Secondary | ICD-10-CM | POA: Diagnosis not present

## 2018-04-03 DIAGNOSIS — I509 Heart failure, unspecified: Secondary | ICD-10-CM | POA: Diagnosis not present

## 2018-04-03 DIAGNOSIS — E039 Hypothyroidism, unspecified: Secondary | ICD-10-CM | POA: Diagnosis not present

## 2018-04-05 DIAGNOSIS — L0390 Cellulitis, unspecified: Secondary | ICD-10-CM | POA: Diagnosis not present

## 2018-04-06 DIAGNOSIS — G629 Polyneuropathy, unspecified: Secondary | ICD-10-CM | POA: Diagnosis not present

## 2018-04-06 DIAGNOSIS — E875 Hyperkalemia: Secondary | ICD-10-CM | POA: Diagnosis not present

## 2018-04-07 ENCOUNTER — Ambulatory Visit: Payer: Medicare Other | Admitting: Cardiology

## 2018-04-10 ENCOUNTER — Encounter: Payer: Self-pay | Admitting: Cardiology

## 2018-04-10 DIAGNOSIS — G629 Polyneuropathy, unspecified: Secondary | ICD-10-CM | POA: Diagnosis not present

## 2018-04-10 DIAGNOSIS — L0390 Cellulitis, unspecified: Secondary | ICD-10-CM | POA: Diagnosis not present

## 2018-04-11 DIAGNOSIS — K228 Other specified diseases of esophagus: Secondary | ICD-10-CM | POA: Diagnosis not present

## 2018-04-11 DIAGNOSIS — R0602 Shortness of breath: Secondary | ICD-10-CM | POA: Diagnosis not present

## 2018-04-11 DIAGNOSIS — C73 Malignant neoplasm of thyroid gland: Secondary | ICD-10-CM | POA: Diagnosis not present

## 2018-04-11 DIAGNOSIS — I480 Paroxysmal atrial fibrillation: Secondary | ICD-10-CM | POA: Diagnosis not present

## 2018-04-13 DIAGNOSIS — G629 Polyneuropathy, unspecified: Secondary | ICD-10-CM | POA: Diagnosis not present

## 2018-04-13 DIAGNOSIS — I509 Heart failure, unspecified: Secondary | ICD-10-CM | POA: Diagnosis not present

## 2018-04-13 DIAGNOSIS — C73 Malignant neoplasm of thyroid gland: Secondary | ICD-10-CM | POA: Diagnosis not present

## 2018-04-17 DIAGNOSIS — K5900 Constipation, unspecified: Secondary | ICD-10-CM | POA: Diagnosis not present

## 2018-04-17 DIAGNOSIS — I509 Heart failure, unspecified: Secondary | ICD-10-CM | POA: Diagnosis not present

## 2018-04-17 DIAGNOSIS — J441 Chronic obstructive pulmonary disease with (acute) exacerbation: Secondary | ICD-10-CM | POA: Diagnosis not present

## 2018-04-18 DIAGNOSIS — L97429 Non-pressure chronic ulcer of left heel and midfoot with unspecified severity: Secondary | ICD-10-CM | POA: Diagnosis not present

## 2018-04-18 DIAGNOSIS — R531 Weakness: Secondary | ICD-10-CM | POA: Diagnosis not present

## 2018-04-19 DIAGNOSIS — I509 Heart failure, unspecified: Secondary | ICD-10-CM | POA: Diagnosis not present

## 2018-04-19 DIAGNOSIS — K5900 Constipation, unspecified: Secondary | ICD-10-CM | POA: Diagnosis not present

## 2018-04-19 DIAGNOSIS — I480 Paroxysmal atrial fibrillation: Secondary | ICD-10-CM | POA: Diagnosis not present

## 2018-04-20 DIAGNOSIS — C73 Malignant neoplasm of thyroid gland: Secondary | ICD-10-CM | POA: Diagnosis not present

## 2018-04-20 DIAGNOSIS — E890 Postprocedural hypothyroidism: Secondary | ICD-10-CM | POA: Diagnosis not present

## 2018-04-21 ENCOUNTER — Encounter (HOSPITAL_COMMUNITY): Payer: Self-pay | Admitting: Hematology

## 2018-04-21 ENCOUNTER — Inpatient Hospital Stay (HOSPITAL_COMMUNITY): Payer: No Typology Code available for payment source

## 2018-04-21 ENCOUNTER — Inpatient Hospital Stay (HOSPITAL_COMMUNITY): Payer: No Typology Code available for payment source | Attending: Hematology | Admitting: Hematology

## 2018-04-21 VITALS — BP 127/32 | HR 52 | Temp 97.7°F | Resp 16 | Ht 69.0 in | Wt 183.0 lb

## 2018-04-21 DIAGNOSIS — J3801 Paralysis of vocal cords and larynx, unilateral: Secondary | ICD-10-CM

## 2018-04-21 DIAGNOSIS — Z79899 Other long term (current) drug therapy: Secondary | ICD-10-CM | POA: Insufficient documentation

## 2018-04-21 DIAGNOSIS — G629 Polyneuropathy, unspecified: Secondary | ICD-10-CM | POA: Diagnosis not present

## 2018-04-21 DIAGNOSIS — E039 Hypothyroidism, unspecified: Secondary | ICD-10-CM | POA: Diagnosis not present

## 2018-04-21 DIAGNOSIS — D649 Anemia, unspecified: Secondary | ICD-10-CM

## 2018-04-21 DIAGNOSIS — C7951 Secondary malignant neoplasm of bone: Secondary | ICD-10-CM | POA: Insufficient documentation

## 2018-04-21 DIAGNOSIS — I2510 Atherosclerotic heart disease of native coronary artery without angina pectoris: Secondary | ICD-10-CM | POA: Diagnosis not present

## 2018-04-21 DIAGNOSIS — Z7901 Long term (current) use of anticoagulants: Secondary | ICD-10-CM | POA: Insufficient documentation

## 2018-04-21 DIAGNOSIS — Z8711 Personal history of peptic ulcer disease: Secondary | ICD-10-CM | POA: Insufficient documentation

## 2018-04-21 DIAGNOSIS — R634 Abnormal weight loss: Secondary | ICD-10-CM | POA: Diagnosis not present

## 2018-04-21 DIAGNOSIS — J449 Chronic obstructive pulmonary disease, unspecified: Secondary | ICD-10-CM | POA: Diagnosis not present

## 2018-04-21 DIAGNOSIS — C73 Malignant neoplasm of thyroid gland: Secondary | ICD-10-CM | POA: Diagnosis not present

## 2018-04-21 DIAGNOSIS — I5032 Chronic diastolic (congestive) heart failure: Secondary | ICD-10-CM | POA: Insufficient documentation

## 2018-04-21 DIAGNOSIS — Z931 Gastrostomy status: Secondary | ICD-10-CM

## 2018-04-21 DIAGNOSIS — I252 Old myocardial infarction: Secondary | ICD-10-CM | POA: Insufficient documentation

## 2018-04-21 DIAGNOSIS — F17210 Nicotine dependence, cigarettes, uncomplicated: Secondary | ICD-10-CM | POA: Insufficient documentation

## 2018-04-21 DIAGNOSIS — Z951 Presence of aortocoronary bypass graft: Secondary | ICD-10-CM | POA: Insufficient documentation

## 2018-04-21 DIAGNOSIS — I4892 Unspecified atrial flutter: Secondary | ICD-10-CM | POA: Insufficient documentation

## 2018-04-21 DIAGNOSIS — K219 Gastro-esophageal reflux disease without esophagitis: Secondary | ICD-10-CM | POA: Insufficient documentation

## 2018-04-21 DIAGNOSIS — C7800 Secondary malignant neoplasm of unspecified lung: Secondary | ICD-10-CM

## 2018-04-21 LAB — COMPREHENSIVE METABOLIC PANEL
ALK PHOS: 75 U/L (ref 38–126)
ALT: 20 U/L (ref 17–63)
AST: 20 U/L (ref 15–41)
Albumin: 3.5 g/dL (ref 3.5–5.0)
Anion gap: 9 (ref 5–15)
BILIRUBIN TOTAL: 0.7 mg/dL (ref 0.3–1.2)
BUN: 28 mg/dL — ABNORMAL HIGH (ref 6–20)
CALCIUM: 9 mg/dL (ref 8.9–10.3)
CO2: 28 mmol/L (ref 22–32)
CREATININE: 0.86 mg/dL (ref 0.61–1.24)
Chloride: 96 mmol/L — ABNORMAL LOW (ref 101–111)
Glucose, Bld: 93 mg/dL (ref 65–99)
Potassium: 4.3 mmol/L (ref 3.5–5.1)
Sodium: 133 mmol/L — ABNORMAL LOW (ref 135–145)
TOTAL PROTEIN: 6.9 g/dL (ref 6.5–8.1)

## 2018-04-21 LAB — CBC WITH DIFFERENTIAL/PLATELET
Basophils Absolute: 0 10*3/uL (ref 0.0–0.1)
Basophils Relative: 0 %
Eosinophils Absolute: 0.4 10*3/uL (ref 0.0–0.7)
Eosinophils Relative: 4 %
HCT: 27.4 % — ABNORMAL LOW (ref 39.0–52.0)
HEMOGLOBIN: 8.1 g/dL — AB (ref 13.0–17.0)
LYMPHS ABS: 0.8 10*3/uL (ref 0.7–4.0)
LYMPHS PCT: 9 %
MCH: 26.6 pg (ref 26.0–34.0)
MCHC: 29.6 g/dL — ABNORMAL LOW (ref 30.0–36.0)
MCV: 90.1 fL (ref 78.0–100.0)
MONOS PCT: 9 %
Monocytes Absolute: 0.9 10*3/uL (ref 0.1–1.0)
NEUTROS PCT: 78 %
Neutro Abs: 7.6 10*3/uL (ref 1.7–7.7)
Platelets: 367 10*3/uL (ref 150–400)
RBC: 3.04 MIL/uL — AB (ref 4.22–5.81)
RDW: 16.4 % — ABNORMAL HIGH (ref 11.5–15.5)
WBC: 9.7 10*3/uL (ref 4.0–10.5)

## 2018-04-21 LAB — IRON AND TIBC
Iron: 35 ug/dL — ABNORMAL LOW (ref 45–182)
Saturation Ratios: 9 % — ABNORMAL LOW (ref 17.9–39.5)
TIBC: 410 ug/dL (ref 250–450)
UIBC: 375 ug/dL

## 2018-04-21 LAB — VITAMIN B12: VITAMIN B 12: 638 pg/mL (ref 180–914)

## 2018-04-21 LAB — FERRITIN: Ferritin: 21 ng/mL — ABNORMAL LOW (ref 24–336)

## 2018-04-21 LAB — FOLATE: FOLATE: 28 ng/mL (ref >5.9)

## 2018-04-21 LAB — TSH: TSH: 12.93 u[IU]/mL — ABNORMAL HIGH (ref 0.350–4.500)

## 2018-04-21 NOTE — Patient Instructions (Addendum)
[place] at [facility] Discharge Instructions  You saw Dr. Delton Coombes today. We will get labs drawn today. We will schedule you for a PET scan. We will see you back a few days after your scan to follow up with the results.    Thank you for choosing [place] at [facility] to provide your oncology and hematology care.  To afford each patient quality time with our provider, please arrive at least 15 minutes before your scheduled appointment time.   If you have a lab appointment with the [facility] please come in thru the  Main Entrance and check in at the main information desk  You need to re-schedule your appointment should you arrive 10 or more minutes late.  We strive to give you quality time with our providers, and arriving late affects you and other patients whose appointments are after yours.  Also, if you no show three or more times for appointments you may be dismissed from the clinic at the providers discretion.     Again, thank you for choosing [facility].  Our hope is that these requests will decrease the amount of time that you wait before being seen by our physicians.       _____________________________________________________________  Should you have questions after your visit to [facility], please contact our office at [phone] between the hours of 8:30 a.m. and 4:30 p.m.  Voicemails left after 4:30 p.m. will not be returned until the following business day.  For prescription refill requests, have your pharmacy contact our office.       Resources For Cancer Patients and their Caregivers ? American [employer] Society: Can assist with transportation, wigs, general needs, runs Look Good Feel Better.        ([phone] ? Cancer Care: Provides financial assistance, online support groups, medication/co-pay assistance.  1-800-813-HOPE 915-692-4944) ? [place] Assists  [place] cancer patients and their families through emotional , educational and financial support.  713-519-2158 ? [place] DSS Where to apply for food stamps, Medicaid and utility assistance. 586-050-5737 ? [facility]: Transportation to medical appointments. (670) 488-4015 ? Social Security Administration: May apply for disability if have a Stage IV cancer. (432) 850-6548 223-314-2991 ? [employer], Disability and Transit Services: Assists with nutrition, care and transit needs. [place] Support Programs:   > Cancer Support Group  2nd Tuesday of the month 1pm-2pm, Journey Room   > Creative Journey  3rd Tuesday of the month 1130am-1pm, Journey Room

## 2018-04-21 NOTE — Progress Notes (Signed)
[facility] NOTE  Patient Care Team: Sinda Du, MD as PCP - General (Pulmonary Disease) Satira Sark, MD as PCP - Cardiology (Cardiology) Satira Sark, MD as Consulting Physician (Cardiology)  CHIEF COMPLAINTS/PURPOSE OF CONSULTATION:  Metastatic papillary thyroid cancer   HISTORY OF PRESENTING ILLNESS:  John Sampson 82 y.o. male here for initial consultation for metastatic papillary thyroid cancer; referred by Dr. Chalmers Cater with endocrinology.   His cancer history dates back to 2017, when he was diagnosed with thyroid cancer.  On 09/14/16, he underwent total  thyroidectomy by Dr. Melony Overly; surgical path revealed papillary thyroid cancer with invasion into skeletal muscle; there was extrathyroid extension noted. Surgical margins were positive at that time.  He was treated with RAI in 10/2016.   On 01/2018, he presented back to Dr. Lucia Gaskins for chronic hoarseness. At the time of his initial thryoidectomy, he had nerve invasion causing left vocal cord paralysis. He did have medialization procedure performed at time of thyroidectomy; his voice began to gradually get more hoarse since 2017.  He underwent microlaryngoscopy with vocal cord injection by Dr. Lucia Gaskins.   CT neck on 02/22/18 showed 21 x 14 mm mass left thyroid bed consistent with invasive residual vs recurrent tumor (including suspected esophageal invasion).  New pathologic level for lymphadenopathy with probable additional small alignment lymph nodes left level 7. Multiple pulmonary nodules, some which are new from prior CT and most compatible with progressive metastasis.    CT abd on 02/23/18 again demonstrated bilateral lung nodules suspicious for pulmonary mets. Also noted to have lytic L2 vertebral body lesion suspicious for osseous metastasis.    He had PEG tube placement on 02/24/18.   He was hospitalized from 03/19/18-03/28/18 for shortness of breath; this was thought to be secondary to  acute on chronic diastolic heart failure which reportedly improved with diuretics. He was discharged to SNF.   Past medical history significant for GI bleed/gastric ulcer/AVM of GI tract (followed by GI), PAD, MI, GERD, CAD, COPD, chronic diastolic heart failure.      INTERVAL HISTORY:  John Sampson 82 y.o. male here for initial consultation for metastatic thyroid cancer.   Here today with family.   Currently lives at [facility]; has been there since 03/29/18. He is working with Astron[facility] and physical therapists there.  Prior to recent hospitalization, he was living at home alone; he was walking with a cane at that time.  He is seen today seated in wheelchair. He is on O2 via nasal cannula.  He is not able to walk alone.  Now it is difficult for him to transfer from a chair to a wheelchair d/t weakness and bilat feet neuropathy.  States that he has always had neuropathy.  Patient's family feels like he is not getting much stronger at the SNF, both in terms of his speech and his physical strength.   It is difficult for him to swallow; he does not eat much by mouth at all.  He is able to swallow his saliva without difficulty per his report.  He receives much of his nutrition from PEG tube.  Generally, he takes in 2 cans per day of bolus tube feedings.  Denies N&V.  He has been gradually losing weight; states that he has lost ~30 lbs in the past 6 months.  Denies shortness of breath at rest, frank bleeding episodes, blood in his stools, hematuria, nosebleeds, or gingival bleeding.  He is on Eliquis.  Denies any h/o DVT/PE  that he is aware of.  He has some dyspnea on exertion.  He has occasional constipation.  Denies back pain. Denies asymmetric extremity weakness.   He has an ulceration on his (L) heel, which is relatively new per patient/family.    Oncologic Family History:  -None that he is aware of.     MEDICAL HISTORY:  Past Medical History:  Diagnosis Date  . Atrial flutter ([place])     on Eliquis  . AV malformation of GI tract   . COPD (chronic obstructive pulmonary disease) ([place]) 3.12.14   2D Echo, EF 50-55%  . Coronary atherosclerosis of native coronary artery    Multivessel status post CABG  . Gastric ulcer    Small - nonbleeding  . GERD (gastroesophageal reflux disease)   . Headache(784.0)   . Hypothyroidism   . Iron deficiency anemia    Negative Givens capsule study   . Myocardial infarction ([place])   . PAD (peripheral artery disease) ([facility])    Moderate bilateral SFA disease at angiography 01/2013  . Pituitary macroadenoma ([place])   . Thyroid cancer ([place])   . Vocal cord paralysis    left    SURGICAL HISTORY: Past Surgical History:  Procedure Laterality Date  . ABDOMINAL AORTAGRAM N/A 02/19/2013   Procedure: ABDOMINAL Maxcine Ham;  Surgeon: [name], MD;  Location[facility] CATH LAB;  Service: Cardiovascular;  Laterality: N/A;  . CARDIAC CATHETERIZATION    . CARDIOVERSION N/A 06/16/2015   Procedure: CARDIOVERSION;  Surgeon: Satira Sark, MD;  Location: [facility] ORS;  Service: Cardiovascular;  Laterality: N/A;  . COLONOSCOPY  08/18/2012   Procedure: COLONOSCOPY;  Surgeon: [name], MD;  Location: [facility] ENDO SUITE;  Service: Endoscopy;  Laterality: N/A;  1:25  . COLONOSCOPY N/A 09/12/2017   Procedure: COLONOSCOPY;  Surgeon: [name], MD;  Location[facility] END[facility];  Service: Endoscopy;  Laterality: N/A;  . CORONARY ARTERY BYPASS GRAFT  2003   5 grafts - details not clear  . CRANIOTOMY  12/31/2011   Procedure: CRANIOTOMY HYPOPHYSECTOMY TRANSNASAL APPROACH;  Surgeon: [name], MD;  Location: [place] NEURO ORS;  Service: Neurosurgery;  Laterality: N/A;  Transphenoidal Hypophysectomy With Fat Graft Harvest from right abdomen   . ESOPHAGOGASTRODUODENOSCOPY  08/18/2012   Procedure: ESOPHAGOGASTRODUODENOSCOPY (EGD);  Surgeon: [name], MD;  [facility];  Service: Endoscopy;  Laterality: N/A;  . ESOPHAGOGASTRODUODENOSCOPY N/A 03/31/2017    Procedure: ESOPHAGOGASTRODUODENOSCOPY (EGD);  Surgeon: [name], MD;  [facility];  Service: Endoscopy;  Laterality: N/A;  . ESOPHAGOGASTRODUODENOSCOPY N/A 09/12/2017   Procedure: ESOPHAGOGASTRODUODENOSCOPY (EGD);  Surgeon: [name], MD;  [facility];  Service: Endoscopy;  Laterality: N/A;  10:40  . EYE SURGERY  2012  . GIVENS CAPSULE STUDY N/A 01/25/2013   Procedure: GIVENS CAPSULE STUDY;  Surgeon: [name], MD;  [facility];  Service: Endoscopy;  Laterality: N/A;  730  . HOT HEMOSTASIS  03/31/2017   Procedure: HOT HEMOSTASIS (ARGON PLASMA COAGULATION/BICAP);  Surgeon: [name], MD;  [facility];  Service: Endoscopy;;  duodenum  . IR GASTROSTOMY TUBE MOD SED  02/24/2018  . LARYNGOPLASTY Left 09/14/2016   Procedure: LARYNGOPLASTY;  Surgeon: [name], MD;  Location: [place];  Service: ENT;  Laterality: Left;  . LOWER EXTREMITY ANGIOGRAM N/A 02/19/2013   Procedure: LOWER EXTREMITY ANGIOGRAM;  Surgeon: [name], MD;  Location: [facility] CATH LAB;  Service: Cardiovascular;  Laterality: N/A;  . MICROLARYNGOSCOPY N/A 02/10/2018  Procedure: MICROLARYNGOSCOPY WITH VOCAL CORD INJECTION;  Surgeon: [name], MD;  Location: [place];  Service: ENT;  Laterality: N/A;  . PERIPHERAL VASCULAR CATHETERIZATION N/A 01/19/2016   Procedure: Abdominal Aortogram;  Surgeon: Conrad Mosier, MD;  Location: [place] CV LAB;  Service: Cardiovascular;  Laterality: N/A;  . PERIPHERAL VASCULAR CATHETERIZATION Bilateral 01/19/2016   Procedure: Lower Extremity Angiography;  Surgeon: Conrad , MD;  Location: [place] CV LAB;  Service: Cardiovascular;  Laterality: Bilateral;  . PV Angiogram  02/19/13   Indications: slow healing left calf ulcer  . Stress Myocardial Perfusion  12/08/2011   Indications: Abnormal EKG, Eval of prior GABG  . TEE WITHOUT CARDIOVERSION N/A 06/16/2015   Procedure: TRANSESOPHAGEAL  ECHOCARDIOGRAM (TEE);  Surgeon: Satira Sark, MD;  Location: [facility];  Service: Cardiovascular;  Laterality: N/A;  . THYROIDECTOMY N/A 09/14/2016   Procedure: TOTAL THYROIDECTOMY;  Surgeon: [name], MD;  Location: [place];  Service: ENT;  Laterality: N/A;  . TONSILLECTOMY  Age 15    SOCIAL HISTORY: Social History   Socioeconomic History  . Marital status: Widowed    Spouse name: Not on file  . Number of children: 2  . Years of education: Not on file  . Highest education level: Not on file  Occupational History  . Occupation: retired  Scientific laboratory technician  . Financial resource strain: Not hard at all  . Food insecurity:    Worry: Never true    Inability: Never true  . Transportation needs:    Medical: No    Non-medical: No  Tobacco Use  . Smoking status: Former Smoker    Packs/day: 3.00    Years: 45.00    Pack years: 135.00    Types: Cigarettes    Start date: 01/24/1955    Last attempt to quit: 01/23/2002    Years since quitting: 16.2  . Smokeless tobacco: Never Used  Substance and Sexual Activity  . Alcohol use: No    Alcohol/week: 0.0 oz    Comment: "I quit drinking and chasing women" 30 years ago  . Drug use: No  . Sexual activity: Not Currently    Partners: Female  Lifestyle  . Physical activity:    Days per week: 5 days    Minutes per session: 30 min  . Stress: Not at all  Relationships  . Social connections:    Talks on phone: Once a week    Gets together: Once a week    Attends religious service: Never    Active member of club or organization: No    Attends meetings of clubs or organizations: Never    Relationship status: Widowed  . Intimate partner violence:    Fear of current or ex partner: No    Emotionally abused: No    Physically abused: No    Forced sexual activity: No  Other Topics Concern  . Not on file  Social History Narrative   Patient currently resides at [facility]: Family History  Problem Relation  Age of Onset  . CAD Mother   . Heart disease Father        before age 4    ALLERGIES:  has no active allergies.  MEDICATIONS:  Current Outpatient Medications  Medication Sig Dispense Refill  . acetaminophen (TYLENOL) 325 MG tablet Place 2 tablets (650 mg total) into feeding tube every 4 (four) hours as needed for headache or mild pain.    [name] albuterol (  PROVENTIL) (2.5 MG/3ML) 0.083% nebulizer solution Take 3 mLs (2.5 mg total) by nebulization every 6 (six) hours as needed for wheezing or shortness of breath. 75 mL 12  . apixaban (ELIQUIS) 5 MG TABS tablet Place 1 tablet (5 mg total) into feeding tube 2 (two) times daily. 60 tablet   . arformoterol (BROVANA) 15 MCG/2ML NEBU Take 2 mLs (15 mcg total) by nebulization 2 (two) times daily. 120 mL   . budesonide (PULMICORT) 0.5 MG/2ML nebulizer solution Take 2 mLs (0.5 mg total) by nebulization 2 (two) times daily.  12  . docusate sodium (COLACE) 100 MG capsule Take 100 mg by mouth daily. Via PEG    . DULoxetine (CYMBALTA) 30 MG capsule Take 30 mg by mouth daily. Via PEG    . folic acid (FOLVITE) 1 MG tablet Place 1 tablet (1 mg total) into feeding tube daily.    . furosemide (LASIX) 40 MG tablet 1 per tube daily 30 tablet   . gabapentin (NEURONTIN) 250 MG/5ML solution Place 6 mLs (300 mg total) into feeding tube at bedtime.  12  . ipratropium-albuterol (DUONEB) 0.5-2.5 (3) MG/3ML SOLN Take 3 mLs by nebulization 3 (three) times daily. 360 mL   . levothyroxine (SYNTHROID, LEVOTHROID) 75 MCG tablet Place 1 tablet (75 mcg total) into feeding tube daily before breakfast.    . metoprolol tartrate (LOPRESSOR) 25 MG tablet Place 0.5 tablets (12.5 mg total) into feeding tube 2 (two) times daily.    . mirtazapine (REMERON) 15 MG tablet Place 1 tablet (15 mg total) into feeding tube at bedtime.    . Multiple Vitamin (MULTIVITAMIN) LIQD Place 15 mLs into feeding tube daily.    . nitroGLYCERIN (NITROSTAT) 0.4 MG SL tablet Place 1 tablet (0.4 mg total) under  the tongue every 5 (five) minutes as needed for chest pain (contact MD if use needed).  12  . nutrition supplement, JUVEN, (JUVEN) PACK Place 1 packet into feeding tube 2 (two) times daily between meals.  0  . Nutritional Supplements (FEEDING SUPPLEMENT, JEVITY 1.2 CAL,) LIQD Place 1,000 mLs into feeding tube continuous.  0  . pantoprazole sodium (PROTONIX) 40 mg/20 mL PACK Place 20 mLs (40 mg total) into feeding tube daily. 30 each   . potassium chloride 20 MEQ/15ML (10%) SOLN 20 mEq per tube daily 450 mL 0  . potassium chloride 20 MEQ/15ML (10%) SOLN Place 15 mLs (20 mEq total) into feeding tube 2 (two) times daily. 450 mL 0  . simvastatin (ZOCOR) 20 MG tablet Place 1 tablet (20 mg total) into feeding tube at bedtime. 30 tablet   . traMADol (ULTRAM) 50 MG tablet Take 50 mg by mouth every 6 (six) hours as needed.    . Water For Irrigation, Sterile (FREE WATER) SOLN Place 200 mLs into feeding tube 4 (four) times daily.    . predniSONE (STERAPRED UNI-PAK 21 TAB) 10 MG (21) TBPK tablet Take by package instructions (Patient not taking: Reported on 04/21/2018) 21 tablet    No current facility-administered medications for this visit.     REVIEW OF SYSTEMS:   Constitutional: Denies fevers, chills or abnormal night sweats.  Fatigue present. Eyes: Denies blurriness of vision, double vision or watery eyes Ears, nose, mouth, throat, and face: Denies mucositis or sore throat.  Trouble swallowing. Respiratory: Denies cough or wheezes.  Dyspnea on exertion present. Cardiovascular: Denies palpitation, chest discomfort or lower extremity swelling Gastrointestinal:  Denies nausea, heartburn or change in bowel habits.  Baseline constipation present. Skin: Denies abnormal skin rashes  Lymphatics: Denies new lymphadenopathy or easy bruising Neurological:Denies numbness, tingling or new weaknesses Behavioral/Psych: Mood is stable, no new changes  All other systems were reviewed with the patient and are  negative.  PHYSICAL EXAMINATION: ECOG PERFORMANCE STATUS: 2 - Symptomatic, <50% confined to bed  Vitals:   04/21/18 1247  BP: (!) 127/32  Pulse: (!) 52  Resp: 16  Temp: 97.7 F (36.5 C)  SpO2: 100%   Filed Weights   04/21/18 1247  Weight: 183 lb (83 kg)    GENERAL:alert, no distress and comfortable SKIN: skin color, texture, turgor are normal, no rashes or significant lesions.  There is a small area of blackish discoloration on the lateral left foot close to heal. EYES: normal, conjunctiva are pink and non-injected, sclera clear OROPHARYNX:no exudate, no erythema and lips, buccal mucosa, and tongue normal  NECK: supple, thyroid normal size, non-tender, without nodularity.  Small lymph nodes palpable in the neck. LYMPH:  no palpable lymphadenopathy in the  axillary or inguinal LUNGS: clear to auscultation and percussion with normal breathing effort HEART: regular rate & rhythm and no murmurs and no lower extremity edema ABDOMEN:abdomen soft, non-tender and normal bowel sounds.  PEG tube in place. Musculoskeletal:no cyanosis of digits and no clubbing  PSYCH: alert & oriented x 3 with fluent speech NEURO: no focal motor/sensory deficits  LABORATORY DATA:  I have reviewed the data as listed Lab Results  Component Value Date   WBC 9.7 04/21/2018   HGB 8.1 (L) 04/21/2018   HCT 27.4 (L) 04/21/2018   MCV 90.1 04/21/2018   PLT 367 04/21/2018     Chemistry      Component Value Date/Time   NA 133 (L) 04/21/2018 1411   K 4.3 04/21/2018 1411   CL 96 (L) 04/21/2018 1411   CO2 28 04/21/2018 1411   BUN 28 (H) 04/21/2018 1411   CREATININE 0.86 04/21/2018 1411   CREATININE 0.98 04/28/2016 0719      Component Value Date/Time   CALCIUM 9.0 04/21/2018 1411   ALKPHOS 75 04/21/2018 1411   AST 20 04/21/2018 1411   ALT 20 04/21/2018 1411   BILITOT 0.7 04/21/2018 1411       RADIOGRAPHIC STUDIES: I have personally reviewed the radiological images as listed and agreed with the  findings in the report. Dg Chest Port 1 View  Result Date: 03/25/2018 CLINICAL DATA:  Dyspnea. EXAM: PORTABLE CHEST 1 VIEW COMPARISON:  March 19, 2018 FINDINGS: The heart, hila, and mediastinum are normal. No pneumothorax. No nodules or masses. No focal infiltrates. Elevation the right hemidiaphragm is stable. No overt edema. IMPRESSION: No active disease. Electronically Signed   By: Dorise Bullion III M.D   On: 03/25/2018 15:54    ASSESSMENT & PLAN:  Papillary thyroid carcinoma ([place]) 1.  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, lungs: - Thyroidectomy and lymph node biopsy on 09/14/2016, consistent with papillary thyroid carcinoma, margins positive, one lymph node positive - RAI therapy in December 2017, on surveillance since then - CT of the neck on 02/22/2018 shows 2.1 x 1.4 cm left thyroid bed mass, lymphadenopathy at left neck level 7 -CT of the abdomen on 02/23/2018 shows numerous subcentimeter bilateral pulmonary nodules, lytic L2 vertebral body -Hospitalization from 03/19/2018 through 03/28/2018 for shortness of breath, status post feeding tube placement secondary to dysphagia, now at SNF - I had a prolonged discussion with the patient and his son today about the diagnosis and further work-up.  We will order a whole-body PET CT scan.  We  will also send a TSH, thyroglobulin, antithyroglobulin antibody.  We will see him back after the PET/CT scan to discuss options.  He might be a candidate for lenvatinib.  2.  Normocytic anemia: -His recent hemoglobin was around 9 in April.  We will repeat his labs.  Will send iron panel, ferritin, X83, folic acid.  He denies any bleeding per rectum or melena.  3.  Peripheral neuropathy: -His peripheral neuropathy in the feet has been stable.  4.  Weight loss: -Patient reports that he is being given tube feeds twice a day.  I have reviewed his chart which shows Isosource 1.5, 250 mL 5 times a day.  I have put in an order for the nursing home to  change it to 5 times a day.  Orders Placed This Encounter  Procedures  . NM PET Image Initial (PI) Skull Base To Thigh    Standing Status:   Future    Standing Expiration Date:   04/21/2019    Order Specific Question:   If indicated for the ordered procedure, I authorize the administration of a radiopharmaceutical per Radiology protocol    Answer:   Yes    Order Specific Question:   Preferred imaging location?    Answer:   [facility]    Order Specific Question:   Radiology Contrast Protocol - do NOT remove file path    Answer:   [email or web address]    Order Specific Question:   Reason for Exam additional comments    Answer:   metastatic papillary thyroid cancer; staging evaluation  . CBC with Differential/Platelet    Standing Status:   Future    Number of Occurrences:   1    Standing Expiration Date:   04/21/2019  . Comprehensive metabolic panel    Standing Status:   Future    Number of Occurrences:   1    Standing Expiration Date:   04/21/2019    Order Specific Question:   Has the patient fasted?    Answer:   No  . Vitamin B12    Standing Status:   Future    Number of Occurrences:   1    Standing Expiration Date:   04/22/2019  . Folate    Standing Status:   Future    Number of Occurrences:   1    Standing Expiration Date:   04/22/2019  . Iron and TIBC    Standing Status:   Future    Number of Occurrences:   1    Standing Expiration Date:   04/22/2019  . Ferritin    Standing Status:   Future    Number of Occurrences:   1    Standing Expiration Date:   04/22/2019  . Thyroglobulin antibody  . Thyroglobulin Level  . TSH    Standing Status:   Future    Number of Occurrences:   1    Standing Expiration Date:   04/21/2019  . Protein electrophoresis, serum    Standing Status:   Future    Number of Occurrences:   1    Standing Expiration Date:   04/22/2019  . Kappa/lambda light chains    Standing Status:   Future    Number of Occurrences:   1     Standing Expiration Date:   04/22/2019    All questions were answered. The patient knows to call the clinic with any problems, questions or concerns. Total time spent is 60 minutes with more than 50% of the  time spent face-to-face discussing diagnosis, prognosis and further management options. This note includes documentation from Mike Craze, NP, who was present during this patient's office visit and evaluation.  I have reviewed this note for its completeness and accuracy.  I have edited this note accordingly based on my findings and medical opinion.     Derek Jack, MD 04/21/2018 5:11 PM

## 2018-04-21 NOTE — Assessment & Plan Note (Addendum)
1.  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, lungs: - Thyroidectomy and lymph node biopsy on 09/14/2016, consistent with papillary thyroid carcinoma, margins positive, one lymph node positive - RAI therapy in December 2017, on surveillance since then - CT of the neck on 02/22/2018 shows 2.1 x 1.4 cm left thyroid bed mass, lymphadenopathy at left neck level 7 -CT of the abdomen on 02/23/2018 shows numerous subcentimeter bilateral pulmonary nodules, lytic L2 vertebral body -Hospitalization from 03/19/2018 through 03/28/2018 for shortness of breath, status post feeding tube placement secondary to dysphagia, now at SNF - I had a prolonged discussion with the patient and his son today about the diagnosis and further work-up.  We will order a whole-body PET CT scan.  We will also send a TSH, thyroglobulin, antithyroglobulin antibody.  We will see him back after the PET/CT scan to discuss options.  He might be a candidate for lenvatinib.  2.  Normocytic anemia: -His recent hemoglobin was around 9 in April.  We will repeat his labs.  Will send iron panel, ferritin, K02, folic acid.  He denies any bleeding per rectum or melena.  3.  Peripheral neuropathy: -His peripheral neuropathy in the feet has been stable.  4.  Weight loss: -Patient reports that he is being given tube feeds twice a day.  I have reviewed his chart which shows Isosource 1.5, 250 mL 5 times a day.  I have put in an order for the nursing home to change it to 5 times a day.

## 2018-04-22 LAB — THYROGLOBULIN ANTIBODY: Thyroglobulin Antibody: <1 IU/mL (ref 0.0–0.9)

## 2018-04-25 LAB — KAPPA/LAMBDA LIGHT CHAINS
KAPPA FREE LGHT CHN: 29.1 mg/L — AB (ref 3.3–19.4)
Kappa, lambda light chain ratio: 1.58 (ref 0.26–1.65)
Lambda free light chains: 18.4 mg/L (ref 5.7–26.3)

## 2018-04-26 DIAGNOSIS — L89620 Pressure ulcer of left heel, unstageable: Secondary | ICD-10-CM | POA: Diagnosis not present

## 2018-04-26 DIAGNOSIS — I480 Paroxysmal atrial fibrillation: Secondary | ICD-10-CM | POA: Diagnosis not present

## 2018-04-26 DIAGNOSIS — G629 Polyneuropathy, unspecified: Secondary | ICD-10-CM | POA: Diagnosis not present

## 2018-04-26 LAB — PROTEIN ELECTROPHORESIS, SERUM
A/G Ratio: 1.1 (ref 0.7–1.7)
ALBUMIN ELP: 3.3 g/dL (ref 2.9–4.4)
Alpha-1-Globulin: 0.3 g/dL (ref 0.0–0.4)
Alpha-2-Globulin: 0.8 g/dL (ref 0.4–1.0)
BETA GLOBULIN: 1.1 g/dL (ref 0.7–1.3)
GAMMA GLOBULIN: 0.6 g/dL (ref 0.4–1.8)
Globulin, Total: 2.9 g/dL (ref 2.2–3.9)
Total Protein ELP: 6.2 g/dL (ref 6.0–8.5)

## 2018-04-28 LAB — THYROGLOBULIN LEVEL: Thyroglobulin: 31 ng/mL

## 2018-05-01 DIAGNOSIS — G629 Polyneuropathy, unspecified: Secondary | ICD-10-CM | POA: Diagnosis not present

## 2018-05-01 DIAGNOSIS — L89620 Pressure ulcer of left heel, unstageable: Secondary | ICD-10-CM | POA: Diagnosis not present

## 2018-05-02 ENCOUNTER — Encounter (HOSPITAL_COMMUNITY)
Admission: RE | Admit: 2018-05-02 | Discharge: 2018-05-02 | Disposition: A | Discharge status: Home / Self Care | Payer: No Typology Code available for payment source | Source: Ambulatory Visit | Attending: Hematology | Admitting: Hematology

## 2018-05-02 DIAGNOSIS — C7800 Secondary malignant neoplasm of unspecified lung: Secondary | ICD-10-CM | POA: Diagnosis not present

## 2018-05-02 DIAGNOSIS — C73 Malignant neoplasm of thyroid gland: Secondary | ICD-10-CM | POA: Insufficient documentation

## 2018-05-02 DIAGNOSIS — C7951 Secondary malignant neoplasm of bone: Secondary | ICD-10-CM | POA: Insufficient documentation

## 2018-05-02 LAB — GLUCOSE, CAPILLARY: Glucose-Capillary: 108 mg/dL — ABNORMAL HIGH (ref 65–99)

## 2018-05-02 MED ORDER — FLUDEOXYGLUCOSE F - 18 (FDG) INJECTION
9.1600 | Freq: Once | INTRAVENOUS | Status: AC | PRN
  Administered 2018-05-02: 9.16 via INTRAVENOUS

## 2018-05-03 ENCOUNTER — Other Ambulatory Visit: Payer: Self-pay

## 2018-05-03 ENCOUNTER — Encounter (HOSPITAL_COMMUNITY): Payer: Self-pay | Admitting: Hematology

## 2018-05-03 ENCOUNTER — Inpatient Hospital Stay (HOSPITAL_COMMUNITY): Payer: No Typology Code available for payment source | Attending: Hematology | Admitting: Hematology

## 2018-05-03 DIAGNOSIS — R634 Abnormal weight loss: Secondary | ICD-10-CM | POA: Insufficient documentation

## 2018-05-03 DIAGNOSIS — R946 Abnormal results of thyroid function studies: Secondary | ICD-10-CM | POA: Diagnosis not present

## 2018-05-03 DIAGNOSIS — D509 Iron deficiency anemia, unspecified: Secondary | ICD-10-CM | POA: Diagnosis not present

## 2018-05-03 DIAGNOSIS — C787 Secondary malignant neoplasm of liver and intrahepatic bile duct: Secondary | ICD-10-CM | POA: Diagnosis not present

## 2018-05-03 DIAGNOSIS — C778 Secondary and unspecified malignant neoplasm of lymph nodes of multiple regions: Secondary | ICD-10-CM | POA: Diagnosis not present

## 2018-05-03 DIAGNOSIS — C7989 Secondary malignant neoplasm of other specified sites: Secondary | ICD-10-CM

## 2018-05-03 DIAGNOSIS — Z79899 Other long term (current) drug therapy: Secondary | ICD-10-CM | POA: Insufficient documentation

## 2018-05-03 DIAGNOSIS — E890 Postprocedural hypothyroidism: Secondary | ICD-10-CM | POA: Insufficient documentation

## 2018-05-03 DIAGNOSIS — C73 Malignant neoplasm of thyroid gland: Secondary | ICD-10-CM | POA: Diagnosis not present

## 2018-05-03 DIAGNOSIS — G629 Polyneuropathy, unspecified: Secondary | ICD-10-CM | POA: Insufficient documentation

## 2018-05-03 DIAGNOSIS — C7800 Secondary malignant neoplasm of unspecified lung: Secondary | ICD-10-CM | POA: Diagnosis not present

## 2018-05-03 DIAGNOSIS — C7951 Secondary malignant neoplasm of bone: Secondary | ICD-10-CM | POA: Diagnosis not present

## 2018-05-03 MED ORDER — LENVATINIB (10 MG DAILY DOSE) 10 MG PO CPPK: 10.0000 mg | ORAL_CAPSULE | Freq: Every day | ORAL | 0 refills | Status: DC

## 2018-05-03 MED ORDER — LENVATINIB (14 MG DAILY DOSE) 10 & 4 MG PO CPPK: 10.0000 mg | ORAL_CAPSULE | Freq: Every day | ORAL | 0 refills | Status: DC

## 2018-05-03 NOTE — Addendum Note (Signed)
Addended by: Derek Jack on: 05/03/2018 02:32 PM   Modules accepted: Orders

## 2018-05-03 NOTE — Assessment & Plan Note (Signed)
1.  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, liver, thoracic and abdominal lymph nodes, soft tissues and lungs: - Thyroidectomy and lymph node biopsy on 09/14/2016, consistent with papillary thyroid carcinoma, margins positive, one lymph node positive - RAI therapy in December 2017, on surveillance since then - CT of the neck on 02/22/2018 shows 2.1 x 1.4 cm left thyroid bed mass, lymphadenopathy at left neck level 7 -CT of the abdomen on 02/23/2018 shows numerous subcentimeter bilateral pulmonary nodules, lytic L2 vertebral body -Hospitalization from 03/19/2018 through 03/28/2018 for shortness of breath, status post feeding tube placement secondary to dysphagia, now at SNF - I have discussed the results of the PET CT scan dated 05/02/2018 which showed in addition to the primary thyroid lesion, widespread metastatic disease throughout the thoracic lymph nodes, lungs bilaterally, liver, abdominal lymph nodes, soft tissues and bones. -His TSH was elevated at 12.9.  Thyroglobulin was 31.  Thyroglobulin antibody was less than 1.  Patient is currently on 100 mcg of Synthroid daily.  It is not clear whether he is receiving Synthroid on an empty stomach.  I will increase the Synthroid to 200 mcg daily with a goal TSH of <0.62mu/L. -His functional status is poor.  Part of it is from his severe anemia.  We will correct the anemia first.  We discussed options including best supportive care with hospice versus trying therapy with lenvatinib.  I have also called and talked to patient's son Pearlie Oyster [phone]).  Patient and son would like to try lenvatinib.  I plan to start it at a lower dose of 14 mg daily.  I would goal is to increase it gradually to 20 mg if tolerated and 24 mg which is the full dose.  We talked about the side effects including but not limited to hypertension, fatigue, hand-foot skin reaction, electrolyte and endocrine abnormalities, GI symptoms including diarrhea, decreased appetite and  nausea, liver function abnormalities among others.  We will monitor CMP every 2 weeks for 2 months.  We will also monitor for proteinuria.  We will do EKG as needed.  2.  Normocytic anemia: -He denies any bleeding per rectum or melena.  Most recent hemoglobin dropped to 8.1.  We checked his iron panel which showed ferritin of 21.  Percent saturation was 9.  H67 and folic acid were within normal limits.  SPEP was negative.  I have recommended 2 infusions of Feraheme 1 week apart.  We discussed the side effects of Feraheme including anaphylactic reactions.  Patient understands and gives [place] permission to proceed with the treatment.  We will schedule it tomorrow.  3.  Peripheral neuropathy: -His peripheral neuropathy in the feet has been stable.  4.  Weight loss: -He is receiving Isosource 1.5, 250 mL 5 times a day.

## 2018-05-03 NOTE — Addendum Note (Signed)
Addended by: Derek Jack on: 05/03/2018 02:06 PM   Modules accepted: Orders

## 2018-05-03 NOTE — Progress Notes (Signed)
John Sampson, [place] [address]   CLINIC:  Medical Oncology/Hematology  PCP:  Sinda Du, [place] [address] [phone]   REASON FOR VISIT:  Follow-up for metastatic papillary thyroid cancer.  CURRENT THERAPY: Lenvatinib being considered.   INTERVAL HISTORY:  John Sampson 82 y.o. male returns for follow-up of metastatic papillary thyroid cancer.  He is accompanied by his friend today.  Denies any bleeding per rectum or melena.  He complains of feeling very tired in the past few days.  Does not report any pain other than his left knee.  He has trouble swallowing solids and liquids.  He does report some constipation.  Numbness in the feet has been stable.  No fevers or night sweats was reported.   REVIEW OF SYSTEMS:  Review of Systems  Constitutional: Positive for fatigue.  HENT:   Positive for trouble swallowing.   Gastrointestinal: Positive for constipation.  Neurological: Positive for numbness.  Hematological: Bruises/bleeds easily.  All other systems reviewed and are negative.    PAST MEDICAL/SURGICAL HISTORY:  Past Medical History:  Diagnosis Date  . Atrial flutter ([place])    on Eliquis  . AV malformation of GI tract   . COPD (chronic obstructive pulmonary disease) ([place]) 3.12.14   2D Echo, EF 50-55%  . Coronary atherosclerosis of native coronary artery    Multivessel status post CABG  . Gastric ulcer    Small - nonbleeding  . GERD (gastroesophageal reflux disease)   . Headache(784.0)   . Hypothyroidism   . Iron deficiency anemia    Negative Givens capsule study   . Myocardial infarction ([place])   . PAD (peripheral artery disease) ([facility])    Moderate bilateral SFA disease at angiography 01/2013  . Pituitary macroadenoma ([place])   . Thyroid cancer ([place])   . Vocal cord paralysis    left   Past Surgical History:  Procedure Laterality Date  . ABDOMINAL AORTAGRAM N/A 02/19/2013   Procedure:  ABDOMINAL Maxcine Ham;  Surgeon: [name], MD;  Location: [facility] CATH LAB;  Service: Cardiovascular;  Laterality: N/A;  . CARDIAC CATHETERIZATION    . CARDIOVERSION N/A 06/16/2015   Procedure: CARDIOVERSION;  Surgeon: Satira Sark, MD;  [facility]: [facility] ORS;  Service: Cardiovascular;  Laterality: N/A;  . COLONOSCOPY  08/18/2012   Procedure: COLONOSCOPY;  Surgeon: [name], MD;  [facility] END[facility];  Service: Endoscopy;  Laterality: N/A;  1:25  . COLONOSCOPY N/A 09/12/2017   Procedure: COLONOSCOPY;  Surgeon: [name], MD;  [facility];  Service: Endoscopy;  Laterality: N/A;  . CORONARY ARTERY BYPASS GRAFT  2003   5 grafts - details not clear  . CRANIOTOMY  12/31/2011   Procedure: CRANIOTOMY HYPOPHYSECTOMY TRANSNASAL APPROACH;  Surgeon: [name], MD;  Location: [place] NEURO ORS;  Service: Neurosurgery;  Laterality: N/A;  Transphenoidal Hypophysectomy With Fat Graft Harvest from right abdomen   . ESOPHAGOGASTRODUODENOSCOPY  08/18/2012   Procedure: ESOPHAGOGASTRODUODENOSCOPY (EGD);  Surgeon: [name], MD;  [facility];  Service: Endoscopy;  Laterality: N/A;  . ESOPHAGOGASTRODUODENOSCOPY N/A 03/31/2017   Procedure: ESOPHAGOGASTRODUODENOSCOPY (EGD);  Surgeon: [name], MD;  [facility];  Service: Endoscopy;  Laterality: N/A;  . ESOPHAGOGASTRODUODENOSCOPY N/A 09/12/2017   Procedure: ESOPHAGOGASTRODUODENOSCOPY (EGD);  Surgeon: [name], MD;  [facility];  Service: Endoscopy;  Laterality: N/A;  10:40  . EYE SURGERY  2012  . GIVENS CAPSULE STUDY  N/A 01/25/2013   Procedure: GIVENS CAPSULE STUDY;  Surgeon: [name], MD;  [facility];  Service: Endoscopy;  Laterality: N/A;  730  . HOT HEMOSTASIS  03/31/2017   Procedure: HOT HEMOSTASIS (ARGON PLASMA COAGULATION/BICAP);  Surgeon: [name], MD;  [facility];  Service: Endoscopy;;  duodenum  . IR GASTROSTOMY TUBE MOD SED  02/24/2018  .  LARYNGOPLASTY Left 09/14/2016   Procedure: LARYNGOPLASTY;  Surgeon: [name], MD;  Location: [place];  Service: ENT;  Laterality: Left;  . LOWER EXTREMITY ANGIOGRAM N/A 02/19/2013   Procedure: LOWER EXTREMITY ANGIOGRAM;  Surgeon: [name], MD;  Location: [facility] CATH LAB;  Service: Cardiovascular;  Laterality: N/A;  . MICROLARYNGOSCOPY N/A 02/10/2018   Procedure: MICROLARYNGOSCOPY WITH VOCAL CORD INJECTION;  Surgeon: [name], MD;  Location: [place];  Service: ENT;  Laterality: N/A;  . PERIPHERAL VASCULAR CATHETERIZATION N/A 01/19/2016   Procedure: Abdominal Aortogram;  Surgeon: Conrad M[place]ing, MD;  Location: [place] CV LAB;  Service: Cardiovascular;  Laterality: N/A;  . PERIPHERAL VASCULAR CATHETERIZATION Bilateral 01/19/2016   Procedure: Lower Extremity Angiography;  Surgeon: Conrad Minden, MD;  Location: [place] CV LAB;  Service: Cardiovascular;  Laterality: Bilateral;  . PV Angiogram  02/19/13   Indications: slow healing left calf ulcer  . Stress Myocardial Perfusion  12/08/2011   Indications: Abnormal EKG, Eval of prior GABG  . TEE WITHOUT CARDIOVERSION N/A 06/16/2015   Procedure: TRANSESOPHAGEAL ECHOCARDIOGRAM (TEE);  Surgeon: Satira Sark, MD;  [facility];  Service: Cardiovascular;  Laterality: N/A;  . THYROIDECTOMY N/A 09/14/2016   Procedure: TOTAL THYROIDECTOMY;  Surgeon: [name], MD;  Location: [place];  Service: ENT;  Laterality: N/A;  . TONSILLECTOMY  Age 26     SOCIAL HISTORY:  Social History   Socioeconomic History  . Marital status: Widowed    Spouse name: Not on file  . Number of children: 2  . Years of education: Not on file  . Highest education level: Not on file  Occupational History  . Occupation: retired  Scientific laboratory technician  . Financial resource strain: Not hard at all  . Food insecurity:    Worry: Never true    Inability: Never true  . Transportation needs:    Medical: No    Non-medical: No  Tobacco  Use  . Smoking status: Former Smoker    Packs/day: 3.00    Years: 45.00    Pack years: 135.00    Types: Cigarettes    Start date: 01/24/1955    Last attempt to quit: 01/23/2002    Years since quitting: 16.2  . Smokeless tobacco: Never Used  Substance and Sexual Activity  . Alcohol use: No    Alcohol/week: 0.0 oz    Comment: "I quit drinking and chasing women" 30 years ago  . Drug use: No  . Sexual activity: Not Currently    Partners: Female  Lifestyle  . Physical activity:    Days per week: 5 days    Minutes per session: 30 min  . Stress: Not at all  Relationships  . Social connections:    Talks on phone: Once a week    Gets together: Once a week    Attends religious service: Never    Active member of club or organization: No    Attends meetings of clubs or organizations: Never    Relationship status: Widowed  . Intimate partner violence:    Fear of current  or ex partner: No    Emotionally abused: No    Physically abused: No    Forced sexual activity: No  Other Topics Concern  . Not on file  Social History Narrative   Patient currently resides at [place]:  Family History  Problem Relation Age of Onset  . CAD Mother   . Heart disease Father        before age 61    CURRENT MEDICATIONS:  Outpatient Encounter Medications as of 05/03/2018  Medication Sig  . acetaminophen (TYLENOL) 325 MG tablet Place 2 tablets (650 mg total) into feeding tube every 4 (four) hours as needed for headache or mild pain.  [name] albuterol (PROVENTIL) (2.5 MG/3ML) 0.083% nebulizer solution Take 3 mLs (2.5 mg total) by nebulization every 6 (six) hours as needed for wheezing or shortness of breath.  [name] apixaban (ELIQUIS) 5 MG TABS tablet Place 1 tablet (5 mg total) into feeding tube 2 (two) times daily.  [name] arformoterol (BROVANA) 15 MCG/2ML NEBU Take 2 mLs (15 mcg total) by nebulization 2 (two) times daily.  . budesonide (PULMICORT) 0.5 MG/2ML nebulizer solution Take 2 mLs  (0.5 mg total) by nebulization 2 (two) times daily.  [name] docusate sodium (COLACE) 100 MG capsule Take 100 mg by mouth daily. Via PEG  . DULoxetine (CYMBALTA) 30 MG capsule Take 30 mg by mouth daily. Via PEG  . folic acid (FOLVITE) 1 MG tablet Place 1 tablet (1 mg total) into feeding tube daily.  . furosemide (LASIX) 40 MG tablet 1 per tube daily  . gabapentin (NEURONTIN) 250 MG/5ML solution Place 6 mLs (300 mg total) into feeding tube at bedtime.  [name] ipratropium-albuterol (DUONEB) 0.5-2.5 (3) MG/3ML SOLN Take 3 mLs by nebulization 3 (three) times daily.  [name] levothyroxine (SYNTHROID, LEVOTHROID) 75 MCG tablet Place 1 tablet (75 mcg total) into feeding tube daily before breakfast.  . metoprolol tartrate (LOPRESSOR) 25 MG tablet Place 0.5 tablets (12.5 mg total) into feeding tube 2 (two) times daily.  . mirtazapine (REMERON) 15 MG tablet Place 1 tablet (15 mg total) into feeding tube at bedtime.  . Multiple Vitamin (MULTIVITAMIN) LIQD Place 15 mLs into feeding tube daily.  . nitroGLYCERIN (NITROSTAT) 0.4 MG SL tablet Place 1 tablet (0.4 mg total) under the tongue every 5 (five) minutes as needed for chest pain (contact MD if use needed).  . nutrition supplement, JUVEN, (JUVEN) PACK Place 1 packet into feeding tube 2 (two) times daily between meals.  . Nutritional Supplements (FEEDING SUPPLEMENT, JEVITY 1.2 CAL,) LIQD Place 1,000 mLs into feeding tube continuous.  . pantoprazole sodium (PROTONIX) 40 mg/20 mL PACK Place 20 mLs (40 mg total) into feeding tube daily.  . potassium chloride 20 MEQ/15ML (10%) SOLN 20 mEq per tube daily  . potassium chloride 20 MEQ/15ML (10%) SOLN Place 15 mLs (20 mEq total) into feeding tube 2 (two) times daily.  . predniSONE (STERAPRED UNI-PAK 21 TAB) 10 MG (21) TBPK tablet Take by package instructions  . simvastatin (ZOCOR) 20 MG tablet Place 1 tablet (20 mg total) into feeding tube at bedtime.  . traMADol (ULTRAM) 50 MG tablet Take 50 mg by mouth every 6 (six) hours as  needed.  . Water For Irrigation, Sterile (FREE WATER) SOLN Place 200 mLs into feeding tube 4 (four) times daily.   No facility-administered encounter medications on file as of 05/03/2018.     ALLERGIES:  No Active Allergies   PHYSICAL EXAM:  ECOG Performance status: 2  Vitals:   05/03/18 1105  BP: 121/85  Pulse: 75  Resp: 18  SpO2: 98%    Physical Exam Deferred.  LABORATORY DATA:  I have reviewed the labs as listed.  CBC    Component Value Date/Time   WBC 9.7 04/21/2018 1411   RBC 3.04 (L) 04/21/2018 1411   HGB 8.1 (L) 04/21/2018 1411   HCT 27.4 (L) 04/21/2018 1411   PLT 367 04/21/2018 1411   MCV 90.1 04/21/2018 1411   MCH 26.6 04/21/2018 1411   MCHC 29.6 (L) 04/21/2018 1411   RDW 16.4 (H) 04/21/2018 1411   LYMPHSABS 0.8 04/21/2018 1411   MONOABS 0.9 04/21/2018 1411   EOSABS 0.4 04/21/2018 1411   BASOSABS 0.0 04/21/2018 1411   CMP Latest Ref Rng & Units 04/21/2018 03/25/2018 03/24/2018  Glucose 65 - 99 mg/dL 93 114(H) 112(H)  BUN 6 - 20 mg/dL 28(H) 57(H) 59(H)  Creatinine 0.61 - 1.24 mg/dL 0.86 1.03 1.05  Sodium 135 - 145 mmol/L 133(L) 136 134(L)  Potassium 3.5 - 5.1 mmol/L 4.3 3.9 3.7  Chloride 101 - 111 mmol/L 96(L) 91(L) 89(L)  CO2 22 - 32 mmol/L 28 32 31  Calcium 8.9 - 10.3 mg/dL 9.0 9.3 9.5  Total Protein 6.5 - 8.1 g/dL 6.9 - -  Total Bilirubin 0.3 - 1.2 mg/dL 0.7 - -  Alkaline Phos 38 - 126 U/L 75 - -  AST 15 - 41 U/L 20 - -  ALT 17 - 63 U/L 20 - -       DIAGNOSTIC IMAGING:  I have independently reviewed the images of PET CT scan dated 05/02/2018 and discussed the results with the patient.    ASSESSMENT & PLAN:   Papillary thyroid carcinoma ([place]) 1.  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, liver, thoracic and abdominal lymph nodes, soft tissues and lungs: - Thyroidectomy and lymph node biopsy on 09/14/2016, consistent with papillary thyroid carcinoma, margins positive, one lymph node positive - RAI therapy in December 2017, on  surveillance since then - CT of the neck on 02/22/2018 shows 2.1 x 1.4 cm left thyroid bed mass, lymphadenopathy at left neck level 7 -CT of the abdomen on 02/23/2018 shows numerous subcentimeter bilateral pulmonary nodules, lytic L2 vertebral body -Hospitalization from 03/19/2018 through 03/28/2018 for shortness of breath, status post feeding tube placement secondary to dysphagia, now at SNF - I have discussed the results of the PET CT scan dated 05/02/2018 which showed in addition to the primary thyroid lesion, widespread metastatic disease throughout the thoracic lymph nodes, lungs bilaterally, liver, abdominal lymph nodes, soft tissues and bones. -His TSH was elevated at 12.9.  Thyroglobulin was 31.  Thyroglobulin antibody was less than 1.  Patient is currently on 100 mcg of Synthroid daily.  It is not clear whether he is receiving Synthroid on an empty stomach.  I will increase the Synthroid to 200 mcg daily with a goal TSH of <0.54mu/L. -His functional status is poor.  Part of it is from his severe anemia.  We will correct the anemia first.  We discussed options including best supportive care with hospice versus trying therapy with lenvatinib.  I have also called and talked to patient's son Pearlie Oyster [phone]).  Patient and son would like to try lenvatinib.  I plan to start it at a lower dose of 14 mg daily.  I would goal is to increase it gradually to 20 mg if tolerated and 24 mg which is the full dose.  We talked about the side  effects including but not limited to hypertension, fatigue, hand-foot skin reaction, electrolyte and endocrine abnormalities, GI symptoms including diarrhea, decreased appetite and nausea, liver function abnormalities among others.  We will monitor CMP every 2 weeks for 2 months.  We will also monitor for proteinuria.  We will do EKG as needed.  2.  Normocytic anemia: -He denies any bleeding per rectum or melena.  Most recent hemoglobin dropped to 8.1.  We checked his iron  panel which showed ferritin of 21.  Percent saturation was 9.  J44 and folic acid were within normal limits.  SPEP was negative.  I have recommended 2 infusions of Feraheme 1 week apart.  We discussed the side effects of Feraheme including anaphylactic reactions.  Patient understands and gives [place] permission to proceed with the treatment.  We will schedule it tomorrow.  3.  Peripheral neuropathy: -His peripheral neuropathy in the feet has been stable.  4.  Weight loss: -He is receiving Isosource 1.5, 250 mL 5 times a day.      Orders placed this encounter:  No orders of the defined types were placed in this encounter.     Derek Jack, MD [place] [phone]

## 2018-05-04 ENCOUNTER — Encounter (HOSPITAL_COMMUNITY): Payer: Self-pay

## 2018-05-04 ENCOUNTER — Inpatient Hospital Stay (HOSPITAL_COMMUNITY): Payer: No Typology Code available for payment source

## 2018-05-04 VITALS — BP 129/46 | HR 60 | Temp 97.8°F | Resp 18

## 2018-05-04 DIAGNOSIS — C7951 Secondary malignant neoplasm of bone: Secondary | ICD-10-CM

## 2018-05-04 DIAGNOSIS — G629 Polyneuropathy, unspecified: Secondary | ICD-10-CM | POA: Diagnosis not present

## 2018-05-04 DIAGNOSIS — C7800 Secondary malignant neoplasm of unspecified lung: Secondary | ICD-10-CM

## 2018-05-04 DIAGNOSIS — C73 Malignant neoplasm of thyroid gland: Secondary | ICD-10-CM

## 2018-05-04 DIAGNOSIS — D509 Iron deficiency anemia, unspecified: Secondary | ICD-10-CM | POA: Diagnosis not present

## 2018-05-04 DIAGNOSIS — D649 Anemia, unspecified: Secondary | ICD-10-CM

## 2018-05-04 MED ORDER — SODIUM CHLORIDE 0.9 % IV SOLN
INTRAVENOUS | Status: DC
  Administered 2018-05-04: 14:00:00 via INTRAVENOUS

## 2018-05-04 MED ORDER — SODIUM CHLORIDE 0.9 % IV SOLN
510.0000 mg | Freq: Once | INTRAVENOUS | Status: AC
  Administered 2018-05-04: 510 mg via INTRAVENOUS
  Filled 2018-05-04: qty 17

## 2018-05-04 MED ORDER — SODIUM CHLORIDE 0.9% FLUSH
10.0000 mL | Freq: Once | INTRAVENOUS | Status: AC
  Administered 2018-05-04: 10 mL via INTRAVENOUS

## 2018-05-04 NOTE — Progress Notes (Signed)
Patient to treatment room for iron infusion.  Patient needing assistance by 2 people to transfer from wheelchair to treatment chair.  During transfer the patient got a skin tear on the caregivers side with the move.  Site dressed with telfa and kling.  No complaints of pain at site.    Patient tolerated iron infusion with no complaints voiced.  Peripheral IV site clean and dry with no bruising or swelling noted at site.  Good blood return noted before and after administration of iron infusion.  Band aid applied.  VSS with discharge and left via wheelchair with family.  No s/s of distress noted.

## 2018-05-04 NOTE — Patient Instructions (Signed)
[facility] at [facility]  Discharge Instructions:  You received an iron infusion today.  _______________________________________________________________  Thank you for choosing [facility] at [facility] to provide your oncology and hematology care.  To afford each patient quality time with our providers, please arrive at least 15 minutes before your scheduled appointment.  You need to re-schedule your appointment if you arrive 10 or more minutes late.  We strive to give you quality time with our providers, and arriving late affects you and other patients whose appointments are after yours.  Also, if you no show three or more times for appointments you may be dismissed from the clinic.  Again, thank you for choosing  Cancer Center at [facility]. Our hope is that these requests will allow you access to exceptional care and in a timely manner. _______________________________________________________________  If you have questions after your visit, please contact our office at [phone] between the hours of 8:30 a.m. and 5:00 p.m. Voicemails left after 4:30 p.m. will not be returned until the following business day. _______________________________________________________________  For prescription refill requests, have your pharmacy contact our office. _______________________________________________________________  Recommendations made by the consultant and any test results will be sent to your referring physician. _______________________________________________________________ 

## 2018-05-05 ENCOUNTER — Ambulatory Visit: Payer: Medicare Other | Admitting: Cardiology

## 2018-05-10 DIAGNOSIS — E039 Hypothyroidism, unspecified: Secondary | ICD-10-CM | POA: Diagnosis not present

## 2018-05-10 DIAGNOSIS — G629 Polyneuropathy, unspecified: Secondary | ICD-10-CM | POA: Diagnosis not present

## 2018-05-10 DIAGNOSIS — L89620 Pressure ulcer of left heel, unstageable: Secondary | ICD-10-CM | POA: Diagnosis not present

## 2018-05-11 ENCOUNTER — Inpatient Hospital Stay (HOSPITAL_COMMUNITY): Payer: No Typology Code available for payment source

## 2018-05-11 ENCOUNTER — Encounter (HOSPITAL_COMMUNITY): Payer: Self-pay

## 2018-05-11 DIAGNOSIS — D509 Iron deficiency anemia, unspecified: Secondary | ICD-10-CM | POA: Diagnosis not present

## 2018-05-11 MED ORDER — SODIUM CHLORIDE 0.9 % IV SOLN
INTRAVENOUS | Status: DC
  Administered 2018-05-11: 14:00:00 via INTRAVENOUS

## 2018-05-11 MED ORDER — SODIUM CHLORIDE 0.9 % IV SOLN
510.0000 mg | Freq: Once | INTRAVENOUS | Status: AC
  Administered 2018-05-11: 510 mg via INTRAVENOUS
  Filled 2018-05-11: qty 17

## 2018-05-11 NOTE — Progress Notes (Signed)
John Sampson tolerated Feraheme infusion well without complaints or incident.Pt experienced spasms in the waiting area and after Feraheme infusion They only lasted approx. 1 minute and appeared to be noted more in his abdominal area. Pt reported that he has experienced these before and pt's son will let the nurse at [facility] know when he takes his dad back this afternoon. Vital sighs stable prior to and after Iron infusion and pt denied any other issues.Peripheral IV site with positive blood return prior to and after Feraheme infusion complete Pt discharged via wheelchair in stable condition accompanied by his son

## 2018-05-11 NOTE — Patient Instructions (Signed)
[facility] at [facility] Discharge Instructions  Received Feraheme infusion today. Follow-up as scheduled. Call clinic for any questions or concerns   Thank you for choosing [facility] at [facility] to provide your oncology and hematology care.  To afford each patient quality time with our provider, please arrive at least 15 minutes before your scheduled appointment time.   If you have a lab appointment with the Cancer Center please come in thru the  Main Entrance and check in at the main information desk  You need to re-schedule your appointment should you arrive 10 or more minutes late.  We strive to give you quality time with our providers, and arriving late affects you and other patients whose appointments are after yours.  Also, if you no show three or more times for appointments you may be dismissed from the clinic at the providers discretion.     Again, thank you for choosing [facility].  Our hope is that these requests will decrease the amount of time that you wait before being seen by our physicians.       _____________________________________________________________  Should you have questions after your visit to [facility], please contact our office at [phone] between the hours of 8:30 a.m. and 4:30 p.m.  Voicemails left after 4:30 p.m. will not be returned until the following business day.  For prescription refill requests, have your pharmacy contact our office.       Resources For Cancer Patients and their Caregivers ? American Cancer Society: Can assist with transportation, wigs, general needs, runs Look Good Feel Better.        1-888-227-6333 ? Cancer Care: Provides financial assistance, online support groups, medication/co-pay assistance.  1-800-813-HOPE (4673) ? [facility] Assists [place] cancer patients and their families through emotional , educational and  financial support.  336-427-4357 ? [place] DSS Where to apply for food stamps, Medicaid and utility assistance. 336-342-1394 ? [facility]: Transportation to medical appointments. 336-347-2287 ? Social Security Administration: May apply for disability if have a Stage IV cancer. 336-342-7796 1-800-772-1213 ? [place] Aging, Disability and Transit Services: Assists with nutrition, care and transit needs. 336-349-2343  Cancer Center Support Programs:   > Cancer Support Group  2nd Tuesday of the month 1pm-2pm, Journey Room   > Creative Journey  3rd Tuesday of the month 1130am-1pm, Journey Room    

## 2018-05-16 ENCOUNTER — Telehealth (HOSPITAL_COMMUNITY): Payer: Self-pay | Admitting: Hematology

## 2018-05-16 DIAGNOSIS — L89623 Pressure ulcer of left heel, stage 3: Secondary | ICD-10-CM | POA: Diagnosis not present

## 2018-05-16 DIAGNOSIS — J449 Chronic obstructive pulmonary disease, unspecified: Secondary | ICD-10-CM | POA: Diagnosis not present

## 2018-05-16 DIAGNOSIS — I10 Essential (primary) hypertension: Secondary | ICD-10-CM | POA: Diagnosis not present

## 2018-05-16 DIAGNOSIS — C73 Malignant neoplasm of thyroid gland: Secondary | ICD-10-CM | POA: Diagnosis not present

## 2018-05-16 DIAGNOSIS — G629 Polyneuropathy, unspecified: Secondary | ICD-10-CM | POA: Diagnosis not present

## 2018-05-17 ENCOUNTER — Inpatient Hospital Stay (HOSPITAL_COMMUNITY): Payer: No Typology Code available for payment source

## 2018-05-17 DIAGNOSIS — C73 Malignant neoplasm of thyroid gland: Secondary | ICD-10-CM

## 2018-05-17 NOTE — Patient Instructions (Addendum)
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   We are going to start you on an oral medication call lenvima.  You will take 10 mg daily.  Dissolve whole capsule in 15 mL of water or apple juice and leaving liquid for at least 10 minutes, then stir for at least 3 minutes and administere through PEG tube.Make sure to take this at the same time each day.   This treatment is with palliative intent, which means you are treatable but not curable.  You will see the doctor throughout treatment, he will monitor your progress by labs, scans, and by the way you are feeling.     POTENTIAL SIDE EFFECTS OF TREATMENT: Lenvatinib (Lenvima)  About This Drug Lenvatinib is used to treat cancer. It is given by orally (by mouth).  Possible Side Effects . Changes in your thyroid function . Voice disorder . Soreness of the mouth and throat. You may have red areas, white patches, or sores that hurt. . Nausea and vomiting (throwing up) . Decreased appetite (decreased hunger) . Diarrhea (loose bowel movements) . Pain in your abdomen . Abnormal bleeding . Tiredness . Swelling of your legs, ankles and/or feet . Weight loss . Increased protein in your urine . Bone, joint and muscle pain . Headache . Cough, trouble breathing . Hand-and-foot syndrome. The palms of your hands or soles of your feet may tingle, become numb, painful, swollen, or red. . Rash . High blood pressure Note: Each of the side effects above was reported in 30% or greater of patients treated with lenvatinib. Not all possible side effects are included above.   Warnings and Precautions . Severe high blood pressure . Congestive heart failure (your heart has less ability to pump blood properly) and other changes in your heart function which can be  life-threatening . Abnormal heartbeat/EKG (electrocardiogram) . Blood clots and events such as stroke and heart attack. A blood clot in your leg may cause your leg to swell, appear red and warm, and/or cause pain. A blood clot in your lungs may cause trouble breathing, pain when breathing, and/or chest pain. . Changes in your liver function, which can cause liver failure and be life-threatening . Severe diarrhea . Risk of gastrointestinal perforation, which is a hole in your stomach, small, and/or large intestine . Abnormal opening in stomach, intestine or esophagus (fistula). Symptoms of a fistula may be: severe abdominal pain or difficulty swallowing . Changes in your thyroid function . Changes in your kidney function, which can cause renal failure and be life-threatening . Changes in your central nervous system can happen. The central nervous system is made up of your brain and spinal cord. You could feel extreme tiredness, agitation, confusion, hallucinations (see or hear things that are not there), have trouble understanding or speaking, loss of control of your bowels or bladder, eyesight changes, numbness or lack of strength to your arms, legs, face, or body, seizures or coma. If you start to have any of these symptoms let your doctor know right away. . Abnormal bleeding which can be life-threatening - symptoms may be coughing up blood, throwing up blood (may look like coffee grounds), red or black tarry bowel movements, abnormally heavy menstrual flow, nosebleeds or any other unusual bleeding. . Severe low calcium, which can be life-threatening. You may experience numbness or tingling around your mouth  or in your hands or feet. Other symptoms of low calcium are muscle stiffness, twitching, spasms, or cramps. . Slow wound healing Note: Some of the side effects above are very rare. If you have concerns and/or questions, please discuss them with your medical team.  Important Information .  Lenvatinib may cause slow wound healing. If you must have emergency surgery or have an accident that results in a wound, tell the doctor that you are on lenvatinib.  How to Take Your Medication . Swallow the medicine whole with or without food, at the same time each day. . If you have trouble swallowing, you can dissolve the capsules in 1 tablespoon of water or apple juice. Put the capsules whole in the liquid and let stand for at least 10 minutes. Stir for at least 3 minutes and drink the mixture. After drinking, add 1 tablespoon of water or apple juice to the glass, stir and drink the liquid right away. . Missed dose: If you miss a dose, take it as soon as you think about it. If it is within 12 hours of your next dose, then skip the missed dose. Do not take 2 doses at the same time and do not double up on the next dose. Instead, continue with your regular dosing schedule and contact your physician. . Handling: Wash your hands after handling your medicine, your caretakers should not handle your medicine with bare hands and should wear latex gloves. . This drug may be present in the saliva, tears, sweat, urine, stool, vomit, semen, and vaginal secretions. Talk to your doctor and/or your nurse about the necessary precautions to take during this time. . Storage: Store this medicine in the original container at room temperature. . Disposal of unused medicine: Do not flush any expired and/or unused medicine down the toilet or drain unless you are specifically instructed to do so on the medication label. Some facilities have unused medication take-back programs and/or other options. If you do not have a take-back program in your area, then please discuss with your nurse or your doctor how to dispose of unused medicine.  Treating Side Effects . Manage tiredness by pacing your activities for the day. . Be sure to include periods of rest between energy-draining activities. . Drink plenty of fluids (a minimum  of eight glasses per day is recommended). . If you throw up or have loose bowel movements, you should drink more fluids so that you do not become dehydrated (lack of water in the body from losing too much fluid). . To help with nausea and vomiting, eat small, frequent meals instead of three large meals a day. Choose foods and drinks that are at room temperature. Ask your nurse or doctor about other helpful tips and medicine that is available to help or stop lessen these symptoms. . If you get diarrhea, eat low-fiber foods that are high in protein and calories and avoid foods that can irritate your digestive tracts or lead to cramping. . Ask your nurse or doctor about medicine that can lessen or stop your diarrhea. . Mouth care is very important. Your mouth care should consist of routine, gentle cleaning of your teeth or dentures and rinsing your mouth with a mixture of 1/2 teaspoon of salt in 8 ounces of water or 1/2 teaspoon of baking soda in 8 ounces of water. This should be done at least after each meal and at bedtime. . If you have mouth sores, avoid mouthwash that has alcohol. Also avoid alcohol and  smoking because they can bother your mouth and throat. . To help with weight loss, drink fluids that contribute calories (whole milk, juice, soft drinks, sweetened beverages, milkshakes, and nutritional supplements) instead of water. . Include a source of protein at every meal and snack, such as meat, poultry, fish, dry beans, tofu, eggs, nuts, milk, yogurt, cheese, ice cream, pudding, and nutritional supplements. . To help with decreased appetite, eat high calorie food such as pudding, ice cream, yogurt and milkshakes. [name] Keeping your pain under control is important to your well-being. Please tell your doctor or nurse if you are experiencing pain. . If you get a rash do not put anything on it unless your doctor or nurse says you may. Keep the area around the rash clean and dry. Ask your doctor for  medicine if your rash bothers you.  Food and Drug Interactions . There are no known interactions of lenvatinib with food. . This drug may interact with other medicines. Tell your doctor and pharmacist about all the prescription and over-the-counter medicines and dietary supplements (vitamins, minerals, herbs and others) that you are taking at this time. Also, check with your doctor or pharmacist before starting any new prescription or over-the-counter medicines, or dietary supplements to make sure that there are no interactions. . The safety and use of dietary supplements and alternative diets are often not known. Using these might affect your cancer or interfere with your treatment. Do not use dietary supplements or alternative diets without your cancer doctor's help.  When to Call the Doctor Call your doctor or nurse if you have any of these symptoms and/or any new or unusual symptoms: . Fever of 100.4 F (38 C) or higher . Chills . A headache that does not go away . You cough up yellow, green, or bloody mucus . Coughing up blood . Wheezing or trouble breathing . Chest pain or symptoms of a heart attack. Most heart attacks involve pain in the center of the chest that lasts more than a few minutes. The pain may go away and come back. It can feel like pressure, squeezing, fullness, or pain. Sometimes pain is felt in one or both arms, the back, neck, jaw, or stomach. If any of these symptoms last 2 minutes, call 911. [name] Confusion and/or agitation . Seizures . Hallucinations . Trouble understanding or speaking . Blurry vision or changes in your eyesight . Numbness or lack of strength to your arms, legs, face, or body . Pain in your chest . Feeling that your heart is beating in a fast or not normal way (palpitations) . Diarrhea 4 times in a day or loose bowel movements with lack of strength or a feeling of being dizzy . Blood in your stool . Pain in your abdomen that does not go away .  Difficulty swallowing . Nausea that stops you from eating or drinking or relieved by prescribed medicine . Throwing up more than 3 times a day . Signs of low calcium: numbness or tingling around your mouth or in your hands or feet, muscle stiffness, twitching, spasms, or cramps. . Decreased urine . Blood in your urine, vomit (bright red or coffee-ground) and/or stools (bright red, or black/tarry) . Fatigue that interferes with your daily activities . Unexplained weight gain . A new rash or a rash that is not relieved by prescribed medicines . Your leg or arm is swollen, red, warm and/or painful . Pain that does not go away, or is not relieved by prescribed medicines .  Signs of possible liver problems: dark urine, pale bowel movements, bad stomach pain, feeling very tired and weak, unusual itching, or yellowing of the eyes or skin . Weight gain of 5 pounds in one week (fluid retention) . Swelling of your legs, ankles and/or feet . Symptoms of a stroke such as sudden numbness or weakness of your face, arm, or leg, mostly on one side of your body; sudden confusion, trouble speaking or understanding; sudden trouble seeing in one or both eyes; sudden trouble walking, feeling dizzy, loss of balance or coordination; or sudden, bad headache with no known cause. If you have any of these symptoms for 2 minutes, call 911. . If you think you may be pregnant       SELF CARE ACTIVITIES WHILE ON CHEMOTHERAPY: Hydration Increase your fluid intake 48 hours prior to treatment and drink at least 8 to 12 cups (64 ounces) of water/decaff beverages per day after treatment. You can still have your cup of coffee or soda but these beverages do not count as part of your 8 to 12 cups that you need to drink daily. No alcohol intake.  Medications Continue taking your normal prescription medication as prescribed.  If you start any new herbal or new supplements please let us know first to make sure it is  safe.  Mouth Care Have teeth cleaned professionally before starting treatment. Keep dentures and partial plates clean. Use soft toothbrush and do not use mouthwashes that contain alcohol. Biotene is a good mouthwash that is available at most pharmacies or may be ordered by calling [phone]. Use warm salt water gargles (1 teaspoon salt per 1 quart warm water) before and after meals and at bedtime. Or you may rinse with 2 tablespoons of three-percent hydrogen peroxide mixed in eight ounces of water. If you are still having problems with your mouth or sores in your mouth please call the clinic. If you need dental work, please let the doctor know before you go for your appointment so that we can coordinate the best possible time for you in regards to your chemo regimen. You need to also let your dentist know that you are actively taking chemo. We may need to do labs prior to your dental appointment.  Skin Care Always use sunscreen that has not expired and with SPF (Sun Protection Factor) of 50 or higher. Wear hats to protect your head from the sun. Remember to use sunscreen on your hands, ears, face, & feet.  Use good moisturizing lotions such as udder cream, eucerin, or even Vaseline. Some chemotherapies can cause dry skin, color changes in your skin and nails.     Avoid long, hot showers or baths.  Use gentle, fragrance-free soaps and laundry detergent.  Use moisturizers, preferably creams or ointments rather than lotions because the thicker consistency is better at preventing skin dehydration. Apply the cream or ointment within 15 minutes of showering. Reapply moisturizer at night, and moisturize your hands every time after you wash them.  Hair Loss (if your doctor says your hair will fall out)   If your doctor says that your hair is likely to fall out, decide before you begin chemo whether you want to wear a wig. You may want to shop before treatment to match your hair color.  Hats,  turbans, and scarves can also camouflage hair loss, although some people prefer to leave their heads uncovered. If you go bare-headed outdoors, be sure to use sunscreen on your scalp.  Cut your hair  short. It eases the inconvenience of shedding lots of hair, but it also can reduce the emotional impact of watching your hair fall out.  Don't perm or color your hair during chemotherapy. Those chemical treatments are already damaging to hair and can enhance hair loss. Once your chemo treatments are done and your hair has grown back, it's OK to resume dyeing or perming hair. With chemotherapy, hair loss is almost always temporary. But when it grows back, it may be a different color or texture. In older adults who still had hair color before chemotherapy, the new growth may be completely gray.  Often, new hair is very fine and soft.  Infection Prevention Please wash your hands for at least 30 seconds using warm soapy water. Handwashing is the #1 way to prevent the spread of germs. Stay away from sick people or people who are getting over a cold. If you develop respiratory systems such as green/yellow mucus production or productive cough or persistent cough let us know and we will see if you need an antibiotic. It is a good idea to keep a pair of gloves on when going into grocery stores/[employer] to decrease your risk of coming into contact with germs on the carts, etc. Carry alcohol hand gel with you at all times and use it frequently if out in public. If your temperature reaches 100.5 or higher please call the clinic and let us know.  If it is after hours or on the weekend please go to the ER if your temperature is over 100.5.  Please have your own personal thermometer at home to use.    Sex and bodily fluids If you are going to have sex, a condom must be used to protect the person that isn't taking chemotherapy. Chemo can decrease your libido (sex drive). For a few days after chemotherapy, chemotherapy can be  excreted through your bodily fluids.  When using the toilet please close the lid and flush the toilet twice.  Do this for a few day after you have had chemotherapy.   Effects of chemotherapy on your sex life Some changes are simple and won't last long. They won't affect your sex life permanently. Sometimes you may feel:  too tired  not strong enough to be very active  sick or sore   not in the mood  anxious or low Your anxiety might not seem related to sex. For example, you may be worried about the cancer and how your treatment is going. Or you may be worried about money, or about how you family are coping with your illness. These things can cause stress, which can affect your interest in sex. It's important to talk to your partner about how you feel. Remember - the changes to your sex life don't usually last long. There's usually no medical reason to stop having sex during chemo. The drugs won't have any long term physical effects on your performance or enjoyment of sex. Cancer can't be passed on to your partner during sex  Contraception It's important to use reliable contraception during treatment. Avoid getting pregnant while you or your partner are having chemotherapy. This isbecause the drugs may harm the baby. Sometimes chemotherapy drugs can leave a man or woman infertile.  This means you would not be able to have children in the future. You might want to talk to someone about permanent infertility. It can be very difficult to learn that you may no longer be able to have children. Some people find counselling  helpful. There might be ways to preserve your fertility, although this is easier for men than for women. You may want to speak to a fertility expert. You can talk about sperm banking or harvesting your eggs. You can also ask about other fertility options, such as donor eggs. If you haveor have had breast cancer, your doctor might advise you not to take the contraceptive pill.  This is because the hormones in it might affect the cancer. It is not known for sure whether or not chemotherapy drugs can be passed on through semen or secretions from the vagina. Because of this some doctors advise people to use a barrier method if you have sex during treatment. This applies to vaginal, anal or oral sex. Generally, doctors advise a barrier method only for the time you are actually having the treatment and for about a week after your treatment. Advice like this can be worrying, but this does not mean that you have to avoid being intimate with your partner. You can still have close contact with your partner and continue to enjoy sex.  Animals If you have cats or birds we just ask that you not change the litter or change the cage.  Please have someone else do this for you while you are on chemotherapy.   Food Safety During and After Cancer Treatment Food safety is important for people both during and after cancer treatment. Cancer and cancer treatments, such as chemotherapy, radiation therapy, and stem cell/bone marrow transplantation, often weaken the immune system. This makes it harder for your body to protect itself from foodborne illness, also called food poisoning. Foodborne illness is caused by eating food that contains harmful bacteria, parasites, or viruses.    Foods to avoid Some foods have a higher risk of becoming tainted with bacteria. These include:  Unwashed fresh fruit and vegetables, especially leafy vegetables that can hide dirt and other contaminants  Raw sprouts, such as alfalfa sprouts  Raw or undercooked beef, especially ground beef, or other raw or undercooked meat and poultry  Fatty, fried, or spicy foods immediately before or after treatment.  These can sit heavy on your stomach and make you feel nauseous.  Raw or undercooked shellfish, such as oysters.  Sushi and sashimi, which often contain raw fish.   Unpasteurized beverages, such as  unpasteurized fruit juices, raw milk, raw yogurt, or cider  Undercooked eggs, such as soft boiled, over easy, and poached; raw, unpasteurized eggs; or foods made with raw egg, such as homemade raw cookie dough and homemade mayonnaise Simple steps for food safety Shop smart.  Do not buy food stored or displayed in an unclean area.  Do not buy bruised or damaged fruits or vegetables.  Do not buy cans that have cracks, dents, or bulges.  Pick up foods that can spoil at the end of your shopping trip and store them in a cooler on the way home. Prepare and clean up foods carefully.  Rinse all fresh fruits and vegetables under running water, and dry them with a clean towel or paper towel.  Clean the top of cans before opening them.  After preparing food, wash your hands for 20 seconds with hot water and soap. Pay special attention to areas between fingers and under nails.  Clean your utensils and dishes with hot water and soap.  Disinfect your kitchen and cutting boards using 1 teaspoon of liquid, unscented bleach mixed into 1 quart of water.  Dispose of old food.  Eat canned and  packaged food before its expiration date (the "use by" or "best before" date).  Consume refrigerated leftovers within 3 to 4 days. After that time, throw out the food. Even if the food does not smell or look spoiled, it still may be unsafe. Some bacteria, such as Listeria, can grow even on foods stored in the refrigerator if they are kept for too long. Take precautions when eating out.  At restaurants, avoid buffets and salad bars where food sits out for a long time and comes in contact with many people. Food can become contaminated when someone with a virus, often a norovirus, or another "bug" handles it.  Put any leftover food in a "to-go" container yourself, rather than having the server do it. And, refrigerate leftovers as soon as you get home.  Choose restaurants that are clean and that are willing to  prepare your food as you order it cooked.    MEDICATIONS:                                                                                                                                                           Compazine/Prochlorperazine 10mg  tablet. Take 1 tablet every 6 hours as needed for nausea/vomiting. (this can make you sleepy)   SYMPTOMS TO REPORT AS SOON AS POSSIBLE AFTER TREATMENT:  FEVER GREATER THAN 100.5 F  CHILLS WITH OR WITHOUT FEVER  NAUSEA AND VOMITING THAT IS NOT CONTROLLED WITH YOUR NAUSEA MEDICATION  UNUSUAL SHORTNESS OF BREATH  UNUSUAL BRUISING OR BLEEDING  TENDERNESS IN MOUTH AND THROAT WITH OR WITHOUT PRESENCE OF ULCERS  URINARY PROBLEMS  BOWEL PROBLEMS  UNUSUAL RASH     Wear comfortable clothing and clothing appropriate for easy access to any Portacath or PICC line. Let us know if there is anything that we can do to make your therapy better!    What to do if you need assistance after hours or on the weekends: CALL [phone].  HOLD on the line, do not hang up.  You will hear multiple messages but at the end you will be connected with a nurse triage line.  They will contact the doctor if necessary.  Most of the time they will be able to assist you.  Do not call the hospital operator.       I have been informed and understand all of the instructions given to me and have received a copy. I have been instructed to call the clinic [phone] or my family physician as soon as possible for continued medical care, if indicated. I do not have any more questions at this time but understand that I may call the [facility] or the Patient Navigator at [phone] during office hours should I have questions or need assistance in obtaining follow-up care.

## 2018-05-18 DIAGNOSIS — R21 Rash and other nonspecific skin eruption: Secondary | ICD-10-CM | POA: Diagnosis not present

## 2018-05-21 ENCOUNTER — Encounter (HOSPITAL_COMMUNITY): Payer: Self-pay | Admitting: Emergency Medicine

## 2018-05-21 ENCOUNTER — Inpatient Hospital Stay (HOSPITAL_COMMUNITY)
Admission: EM | Admit: 2018-05-21 | Discharge: 2018-05-29 | DRG: 054 | Disposition: E | Payer: Medicare Other | Attending: Pulmonary Disease | Admitting: Pulmonary Disease

## 2018-05-21 ENCOUNTER — Other Ambulatory Visit: Payer: Self-pay

## 2018-05-21 ENCOUNTER — Other Ambulatory Visit (HOSPITAL_COMMUNITY): Payer: Self-pay

## 2018-05-21 ENCOUNTER — Emergency Department (HOSPITAL_COMMUNITY): Payer: Medicare Other

## 2018-05-21 DIAGNOSIS — C73 Malignant neoplasm of thyroid gland: Secondary | ICD-10-CM | POA: Diagnosis not present

## 2018-05-21 DIAGNOSIS — Z9981 Dependence on supplemental oxygen: Secondary | ICD-10-CM

## 2018-05-21 DIAGNOSIS — E43 Unspecified severe protein-calorie malnutrition: Secondary | ICD-10-CM | POA: Diagnosis present

## 2018-05-21 DIAGNOSIS — J438 Other emphysema: Secondary | ICD-10-CM

## 2018-05-21 DIAGNOSIS — R748 Abnormal levels of other serum enzymes: Secondary | ICD-10-CM | POA: Diagnosis present

## 2018-05-21 DIAGNOSIS — I110 Hypertensive heart disease with heart failure: Secondary | ICD-10-CM | POA: Diagnosis present

## 2018-05-21 DIAGNOSIS — R29810 Facial weakness: Secondary | ICD-10-CM | POA: Diagnosis present

## 2018-05-21 DIAGNOSIS — I5033 Acute on chronic diastolic (congestive) heart failure: Secondary | ICD-10-CM | POA: Diagnosis present

## 2018-05-21 DIAGNOSIS — R402362 Coma scale, best motor response, obeys commands, at arrival to emergency department: Secondary | ICD-10-CM | POA: Diagnosis present

## 2018-05-21 DIAGNOSIS — I4892 Unspecified atrial flutter: Secondary | ICD-10-CM | POA: Diagnosis present

## 2018-05-21 DIAGNOSIS — Z79899 Other long term (current) drug therapy: Secondary | ICD-10-CM

## 2018-05-21 DIAGNOSIS — J9611 Chronic respiratory failure with hypoxia: Secondary | ICD-10-CM | POA: Diagnosis not present

## 2018-05-21 DIAGNOSIS — Z79890 Hormone replacement therapy (postmenopausal): Secondary | ICD-10-CM

## 2018-05-21 DIAGNOSIS — R402252 Coma scale, best verbal response, oriented, at arrival to emergency department: Secondary | ICD-10-CM | POA: Diagnosis present

## 2018-05-21 DIAGNOSIS — R402142 Coma scale, eyes open, spontaneous, at arrival to emergency department: Secondary | ICD-10-CM | POA: Diagnosis present

## 2018-05-21 DIAGNOSIS — I2510 Atherosclerotic heart disease of native coronary artery without angina pectoris: Secondary | ICD-10-CM | POA: Diagnosis present

## 2018-05-21 DIAGNOSIS — I6201 Nontraumatic acute subdural hemorrhage: Secondary | ICD-10-CM | POA: Diagnosis not present

## 2018-05-21 DIAGNOSIS — R531 Weakness: Secondary | ICD-10-CM | POA: Diagnosis not present

## 2018-05-21 DIAGNOSIS — C7931 Secondary malignant neoplasm of brain: Secondary | ICD-10-CM | POA: Diagnosis present

## 2018-05-21 DIAGNOSIS — L8990 Pressure ulcer of unspecified site, unspecified stage: Secondary | ICD-10-CM | POA: Diagnosis present

## 2018-05-21 DIAGNOSIS — I4891 Unspecified atrial fibrillation: Secondary | ICD-10-CM | POA: Diagnosis not present

## 2018-05-21 DIAGNOSIS — B029 Zoster without complications: Secondary | ICD-10-CM | POA: Diagnosis present

## 2018-05-21 DIAGNOSIS — R509 Fever, unspecified: Secondary | ICD-10-CM | POA: Diagnosis not present

## 2018-05-21 DIAGNOSIS — R29735 NIHSS score 35: Secondary | ICD-10-CM | POA: Diagnosis present

## 2018-05-21 DIAGNOSIS — E890 Postprocedural hypothyroidism: Secondary | ICD-10-CM | POA: Diagnosis present

## 2018-05-21 DIAGNOSIS — Z66 Do not resuscitate: Secondary | ICD-10-CM | POA: Diagnosis present

## 2018-05-21 DIAGNOSIS — I509 Heart failure, unspecified: Secondary | ICD-10-CM | POA: Diagnosis not present

## 2018-05-21 DIAGNOSIS — R402441 Other coma, without documented Glasgow coma scale score, or with partial score reported, in the field [EMT or ambulance]: Secondary | ICD-10-CM | POA: Diagnosis not present

## 2018-05-21 DIAGNOSIS — Z7951 Long term (current) use of inhaled steroids: Secondary | ICD-10-CM

## 2018-05-21 DIAGNOSIS — K219 Gastro-esophageal reflux disease without esophagitis: Secondary | ICD-10-CM | POA: Diagnosis present

## 2018-05-21 DIAGNOSIS — I480 Paroxysmal atrial fibrillation: Secondary | ICD-10-CM | POA: Diagnosis not present

## 2018-05-21 DIAGNOSIS — Z7952 Long term (current) use of systemic steroids: Secondary | ICD-10-CM

## 2018-05-21 DIAGNOSIS — Z515 Encounter for palliative care: Secondary | ICD-10-CM | POA: Diagnosis not present

## 2018-05-21 DIAGNOSIS — J9621 Acute and chronic respiratory failure with hypoxia: Secondary | ICD-10-CM | POA: Diagnosis present

## 2018-05-21 DIAGNOSIS — L89610 Pressure ulcer of right heel, unstageable: Secondary | ICD-10-CM | POA: Diagnosis present

## 2018-05-21 DIAGNOSIS — K922 Gastrointestinal hemorrhage, unspecified: Secondary | ICD-10-CM | POA: Diagnosis not present

## 2018-05-21 DIAGNOSIS — Z6827 Body mass index (BMI) 27.0-27.9, adult: Secondary | ICD-10-CM

## 2018-05-21 DIAGNOSIS — J3800 Paralysis of vocal cords and larynx, unspecified: Secondary | ICD-10-CM | POA: Diagnosis present

## 2018-05-21 DIAGNOSIS — G936 Cerebral edema: Secondary | ICD-10-CM | POA: Diagnosis present

## 2018-05-21 DIAGNOSIS — I252 Old myocardial infarction: Secondary | ICD-10-CM

## 2018-05-21 DIAGNOSIS — Z7901 Long term (current) use of anticoagulants: Secondary | ICD-10-CM

## 2018-05-21 DIAGNOSIS — I618 Other nontraumatic intracerebral hemorrhage: Secondary | ICD-10-CM | POA: Diagnosis not present

## 2018-05-21 DIAGNOSIS — J449 Chronic obstructive pulmonary disease, unspecified: Secondary | ICD-10-CM | POA: Diagnosis present

## 2018-05-21 DIAGNOSIS — R630 Anorexia: Secondary | ICD-10-CM | POA: Diagnosis not present

## 2018-05-21 DIAGNOSIS — L89620 Pressure ulcer of left heel, unstageable: Secondary | ICD-10-CM | POA: Diagnosis present

## 2018-05-21 DIAGNOSIS — I619 Nontraumatic intracerebral hemorrhage, unspecified: Secondary | ICD-10-CM | POA: Diagnosis present

## 2018-05-21 DIAGNOSIS — Z87891 Personal history of nicotine dependence: Secondary | ICD-10-CM

## 2018-05-21 DIAGNOSIS — Z8249 Family history of ischemic heart disease and other diseases of the circulatory system: Secondary | ICD-10-CM

## 2018-05-21 DIAGNOSIS — R9431 Abnormal electrocardiogram [ECG] [EKG]: Secondary | ICD-10-CM | POA: Diagnosis not present

## 2018-05-21 DIAGNOSIS — Z951 Presence of aortocoronary bypass graft: Secondary | ICD-10-CM

## 2018-05-21 HISTORY — DX: Paralysis of vocal cords and larynx, unspecified: J38.00

## 2018-05-21 HISTORY — DX: Dysphagia, unspecified: R13.10

## 2018-05-21 LAB — COMPREHENSIVE METABOLIC PANEL
ALBUMIN: 3.6 g/dL (ref 3.5–5.0)
ALT: 26 U/L (ref 17–63)
AST: 34 U/L (ref 15–41)
Alkaline Phosphatase: 85 U/L (ref 38–126)
Anion gap: 12 (ref 5–15)
BILIRUBIN TOTAL: 0.6 mg/dL (ref 0.3–1.2)
BUN: 28 mg/dL — AB (ref 6–20)
CHLORIDE: 98 mmol/L — AB (ref 101–111)
CO2: 27 mmol/L (ref 22–32)
CREATININE: 0.81 mg/dL (ref 0.61–1.24)
Calcium: 9.2 mg/dL (ref 8.9–10.3)
GFR calc Af Amer: >60 mL/min (ref >60)
GFR calc non Af Amer: >60 mL/min (ref >60)
GLUCOSE: 126 mg/dL — AB (ref 65–99)
POTASSIUM: 4.2 mmol/L (ref 3.5–5.1)
Sodium: 137 mmol/L (ref 135–145)
TOTAL PROTEIN: 6.9 g/dL (ref 6.5–8.1)

## 2018-05-21 LAB — CBC
HEMATOCRIT: 34.5 % — AB (ref 39.0–52.0)
HEMOGLOBIN: 10.2 g/dL — AB (ref 13.0–17.0)
MCH: 29.2 pg (ref 26.0–34.0)
MCHC: 29.6 g/dL — ABNORMAL LOW (ref 30.0–36.0)
MCV: 98.9 fL (ref 78.0–100.0)
Platelets: 319 10*3/uL (ref 150–400)
RBC: 3.49 MIL/uL — AB (ref 4.22–5.81)
RDW: 21.6 % — ABNORMAL HIGH (ref 11.5–15.5)
WBC: 8.4 10*3/uL (ref 4.0–10.5)

## 2018-05-21 LAB — DIFFERENTIAL
BASOS ABS: 0 10*3/uL (ref 0.0–0.1)
Basophils Relative: 0 %
Eosinophils Absolute: 0.3 10*3/uL (ref 0.0–0.7)
Eosinophils Relative: 3 %
LYMPHS PCT: 9 %
Lymphs Abs: 0.8 10*3/uL (ref 0.7–4.0)
Monocytes Absolute: 0.8 10*3/uL (ref 0.1–1.0)
Monocytes Relative: 9 %
Neutro Abs: 6.5 10*3/uL (ref 1.7–7.7)
Neutrophils Relative %: 79 %

## 2018-05-21 LAB — I-STAT TROPONIN, ED: TROPONIN I, POC: 0.01 ng/mL (ref 0.00–0.08)

## 2018-05-21 LAB — POC OCCULT BLOOD, ED: Fecal Occult Bld: POSITIVE — AB

## 2018-05-21 LAB — CBG MONITORING, ED: Glucose-Capillary: 131 mg/dL — ABNORMAL HIGH (ref 65–99)

## 2018-05-21 LAB — APTT: aPTT: 50 seconds — ABNORMAL HIGH (ref 24–36)

## 2018-05-21 LAB — PROTIME-INR
INR: 1.4
Prothrombin Time: 17 seconds — ABNORMAL HIGH (ref 11.4–15.2)

## 2018-05-21 LAB — ETHANOL: Alcohol, Ethyl (B): <10 mg/dL (ref <10)

## 2018-05-21 MED ORDER — BIOTENE DRY MOUTH MT LIQD: 15.0000 mL | OROMUCOSAL | Status: DC | PRN

## 2018-05-21 MED ORDER — GLYCOPYRROLATE 0.2 MG/ML IJ SOLN
0.2000 mg | INTRAMUSCULAR | Status: DC | PRN
  Administered 2018-05-24: 0.2 mg via SUBCUTANEOUS

## 2018-05-21 MED ORDER — MORPHINE SULFATE (PF) 2 MG/ML IV SOLN
1.0000 mg | INTRAVENOUS | Status: DC | PRN
  Administered 2018-05-21 – 2018-05-25 (×22): 1 mg via INTRAVENOUS
  Filled 2018-05-21 (×22): qty 1

## 2018-05-21 MED ORDER — IPRATROPIUM-ALBUTEROL 0.5-2.5 (3) MG/3ML IN SOLN: 3.0000 mL | Freq: Four times a day (QID) | RESPIRATORY_TRACT | Status: DC | PRN

## 2018-05-21 MED ORDER — ONDANSETRON 4 MG PO TBDP: 4.0000 mg | ORAL_TABLET | Freq: Four times a day (QID) | ORAL | Status: DC | PRN

## 2018-05-21 MED ORDER — GLYCOPYRROLATE 0.2 MG/ML IJ SOLN
0.2000 mg | INTRAMUSCULAR | Status: DC | PRN
  Administered 2018-05-21 – 2018-05-25 (×9): 0.2 mg via INTRAVENOUS
  Filled 2018-05-21 (×10): qty 1

## 2018-05-21 MED ORDER — ONDANSETRON HCL 4 MG/2ML IJ SOLN
4.0000 mg | Freq: Four times a day (QID) | INTRAMUSCULAR | Status: DC | PRN
  Administered 2018-05-21: 4 mg via INTRAVENOUS
  Filled 2018-05-21: qty 2

## 2018-05-21 MED ORDER — PROTHROMBIN COMPLEX CONC HUMAN 500 UNITS IV KIT
3885.0000 [IU] | PACK | Status: AC
  Administered 2018-05-21: 3885 [IU] via INTRAVENOUS
  Filled 2018-05-21: qty 3885

## 2018-05-21 MED ORDER — POLYVINYL ALCOHOL 1.4 % OP SOLN: 1.0000 [drp] | Freq: Four times a day (QID) | OPHTHALMIC | Status: DC | PRN

## 2018-05-21 MED ORDER — IPRATROPIUM-ALBUTEROL 0.5-2.5 (3) MG/3ML IN SOLN
3.0000 mL | Freq: Three times a day (TID) | RESPIRATORY_TRACT | Status: DC
  Filled 2018-05-21: qty 3

## 2018-05-21 MED ORDER — LORAZEPAM 1 MG PO TABS: 1.0000 mg | ORAL_TABLET | ORAL | Status: DC | PRN

## 2018-05-21 MED ORDER — LORAZEPAM 2 MG/ML PO CONC: 1.0000 mg | ORAL | Status: DC | PRN

## 2018-05-21 MED ORDER — GLYCOPYRROLATE 1 MG PO TABS: 1.0000 mg | ORAL_TABLET | ORAL | Status: DC | PRN

## 2018-05-21 MED ORDER — ALBUTEROL SULFATE (2.5 MG/3ML) 0.083% IN NEBU
2.5000 mg | INHALATION_SOLUTION | Freq: Four times a day (QID) | RESPIRATORY_TRACT | Status: DC | PRN
  Filled 2018-05-21: qty 3

## 2018-05-21 MED ORDER — LORAZEPAM 2 MG/ML IJ SOLN
1.0000 mg | INTRAMUSCULAR | Status: DC | PRN
  Administered 2018-05-25: 1 mg via INTRAVENOUS
  Filled 2018-05-21: qty 1

## 2018-05-21 NOTE — ED Notes (Signed)
Pt now unresponsive.  Slight increased work of breathing.  Discussed with family medication options available for pain or anxiety.  They stated they have asked him all morning (as have I) if he was having pain and he denied.  Would not like to give meds at this time.

## 2018-05-21 NOTE — ED Notes (Signed)
Pt is not as alert as before.  Copious oral secretions which son at bedside is providing suction for with [place].  Is able to answer yes and no, but does not fully open eyes which is change from earlier.

## 2018-05-21 NOTE — Progress Notes (Signed)
Patient's family declined PRN Albuterol nebulizer at this time.

## 2018-05-21 NOTE — ED Notes (Signed)
Pt turned and repositioned.  Adult diaper changed.  Pt still able to verbalize needs although very lethargic.

## 2018-05-21 NOTE — ED Notes (Signed)
CODE STROKE sent to CT.

## 2018-05-21 NOTE — Progress Notes (Signed)
Patient resting in bed, family at bedside.

## 2018-05-21 NOTE — Progress Notes (Signed)
Pt vitals abnormal; no interventions needed; patient on comfort measures

## 2018-05-21 NOTE — ED Triage Notes (Signed)
Received report from Amy at [facility].  Pts LKW 0100.  Found at 0610 by nurse, unresponsive and "not his normal self."  Amy also reports pt has rash to left side (?shingles).  VSS at facility 110/62, HR 80, Resp 18 and Sats 94% on 2 L/M.  Pt does wear O2 @ facility.  EMS reports LKW 0100, CBG 155.  # 20g jelco to left AC.

## 2018-05-21 NOTE — ED Notes (Addendum)
Pt's family called out stating they thought pt may be vomiting.  They had suctioned pt.  Medicated with Zofran as ordered.  Beginning to shake and continues to be unresponsive.  No definite seizure activity noted.

## 2018-05-21 NOTE — ED Notes (Signed)
Pt not candidate for TPA due to hemorrhage at this time.  Neurology clear.

## 2018-05-21 NOTE — ED Notes (Signed)
Pt still able to verbalize he is in no pain although his is much less responsive than arrival.

## 2018-05-21 NOTE — ED Triage Notes (Signed)
Pt has unilateral rash to left side of abd and back since Friday.

## 2018-05-21 NOTE — ED Notes (Signed)
Pt becoming more lethargic.  Still able to mumble yes or no, but much less alert.  Family at bedside.

## 2018-05-21 NOTE — ED Provider Notes (Addendum)
[facility] EMERGENCY DEPARTMENT Provider Note   CSN: [id number] Arrival date & time: 05/28/2018  0706     History   Chief Complaint Chief Complaint  Patient presents with  . Code Stroke    HPI REILEY KEISLER is a 82 y.o. male. Level 5 caveat secondary to urgency of situation and patient unable to give any history HPI  82 year old male presents today via EMS from nursing home with last known normal at 01 100 a.m.  Reports that he was given an Ultram at 01 100.  EMS reports that this is not a new medication for him.  Report when staff went in to get him this morning he was gazing to the right and unable to move his left side.  On their arrival patient was unable to move left arm and gazing to the right.  They also noted that he had a rash on the left flank.  Blood sugar was checked and is 150.  There is no reported history of seizures.  Discharge summary 03/19/18 Active Problems:   Essential hypertension, benign   COPD (chronic obstructive pulmonary disease) (HCC)   PAF (paroxysmal atrial fibrillation) (HCC)   Acute on chronic diastolic CHF (congestive heart failure) (HCC)   Thyroid cancer (HCC)   CAD (coronary artery disease)   Hypothyroidism (acquired)   Acute on chronic respiratory failure with hypoxia (HCC)   Elevated troponin   Lactic acid acidosis   Pressure injury of skin   Dyspnea COPD exacerbation   Past Medical History:  Diagnosis Date  . Atrial flutter ([place])    on Eliquis  . AV malformation of GI tract   . CHF (congestive heart failure) ([place])   . COPD (chronic obstructive pulmonary disease) ([place]) 3.12.14   2D Echo, EF 50-55%  . Coronary atherosclerosis of native coronary artery    Multivessel status post CABG  . Dysphagia   . Gastric ulcer    Small - nonbleeding  . GERD (gastroesophageal reflux disease)   . Headache(784.0)   . Hypothyroidism   . Iron deficiency anemia    Negative Givens capsule study   . Myocardial infarction ([place])   . PAD (peripheral  artery disease) (HCC)    Moderate bilateral SFA disease at angiography 01/2013  . Paralysis of vocal cords   . Pituitary macroadenoma ([place])   . Thyroid cancer ([place])   . Vocal cord paralysis    left    Patient Active Problem List   Diagnosis Date Noted  . 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 Dyspnea   . Pressure injury of skin 03/22/2018  . Acute on chronic respiratory failure with hypoxia ([place]) 03/19/2018  . Elevated troponin 03/19/2018  . Lactic acid acidosis 03/19/2018  . Protein-calorie malnutrition, severe 02/17/2018  . Dysphagia 02/14/2018  . Hypothyroidism (acquired) 02/14/2018  . Abrasion of lower leg, left, initial encounter 02/14/2018  . Preoperative clearance 01/11/2018  . Bilateral carotid bruits 01/11/2018  . CAD (coronary artery disease) 01/10/2018  . Iron deficiency anemia due to chronic blood loss 08/24/2017  . GI bleeding 03/31/2017  . GI bleed 03/30/2017  . Thyroid cancer ([place]) 09/14/2016  . HCAP (healthcare-associated pneumonia) 08/24/2016  . Pneumonia 08/23/2016  . Acute on chronic diastolic CHF (congestive heart failure) ([place]) 07/24/2016  . Cellulitis of right arm 07/20/2016  . Cellulitis and abscess of hand 07/20/2016  . Venous stasis ulcers ([place]) 12/24/2015  . Chronic venous insufficiency 12/24/2015  . Atherosclerosis of extremity with ulceration ([place]) 12/24/2015  . CHF (congestive heart  failure) ([place]) 12/24/2015  . Critical lower limb ischemia 12/24/2015  . Obesity 12/24/2015  . PAF (paroxysmal atrial fibrillation) ([place]) 12/24/2015  . PAD (peripheral artery disease) ([place]) 06/07/2014  . COPD (chronic obstructive pulmonary disease) ([place]) 01/23/2013  . Anemia 08/03/2012  . Essential hypertension, benign 08/03/2012  . Mixed hyperlipidemia 08/03/2012  . Hx of CABG 08/03/2012    Past Surgical History:  Procedure Laterality Date  . ABDOMINAL AORTAGRAM N/A 02/19/2013   Procedure: ABDOMINAL Maxcine Ham;  Surgeon: [name], MD;  Location:  [facility] CATH LAB;  Service: Cardiovascular;  Laterality: N/A;  . CARDIAC CATHETERIZATION    . CARDIOVERSION N/A 06/16/2015   Procedure: CARDIOVERSION;  Surgeon: Satira Sark, MD;  Location: [facility] ORS;  Service: Cardiovascular;  Laterality: N/A;  . COLONOSCOPY  08/18/2012   Procedure: COLONOSCOPY;  Surgeon: [name], MD;  Location: [facility] ENDO SU[facility];  Service: Endoscopy;  Laterality: N/A;  1:25  . COLONOSCOPY N/A 09/12/2017   Procedure: COLONOSCOPY;  Surgeon: [name], MD;  Location[facility] ENDO [facility];  Service: Endoscopy;  Laterality: N/A;  . CORONARY ARTERY BYPASS GRAFT  2003   5 grafts - details not clear  . CRANIOTOMY  12/31/2011   Procedure: CRANIOTOMY HYPOPHYSECTOMY TRANSNASAL APPROACH;  Surgeon: [name], MD;  Location: [place] NEURO ORS;  Service: Neurosurgery;  Laterality: N/A;  Transphenoidal Hypophysectomy With Fat Graft Harvest from right abdomen   . ESOPHAGOGASTRODUODENOSCOPY  08/18/2012   Procedure: ESOPHAGOGASTRODUODENOSCOPY (EGD);  Surgeon: [name], MD;  [facility];  Service: Endoscopy;  Laterality: N/A;  . ESOPHAGOGASTRODUODENOSCOPY N/A 03/31/2017   Procedure: ESOPHAGOGASTRODUODENOSCOPY (EGD);  Surgeon: [name], MD;  [facility];  Service: Endoscopy;  Laterality: N/A;  . ESOPHAGOGASTRODUODENOSCOPY N/A 09/12/2017   Procedure: ESOPHAGOGASTRODUODENOSCOPY (EGD);  Surgeon: [name], MD;  [facility];  Service: Endoscopy;  Laterality: N/A;  10:40  . EYE SURGERY  2012  . GIVENS CAPSULE STUDY N/A 01/25/2013   Procedure: GIVENS CAPSULE STUDY;  Surgeon: [name], MD;  [facility];  Service: Endoscopy;  Laterality: N/A;  730  . HOT HEMOSTASIS  03/31/2017   Procedure: HOT HEMOSTASIS (ARGON PLASMA COAGULATION/BICAP);  Surgeon: [name], MD;  [facility];  Service: Endoscopy;;  duodenum  . IR GASTROSTOMY TUBE MOD SED  02/24/2018  . LARYNGOPLASTY Left 09/14/2016   Procedure: LARYNGOPLASTY;  Surgeon:  [name], MD;  Location: [place];  Service: ENT;  Laterality: Left;  . LOWER EXTREMITY ANGIOGRAM N/A 02/19/2013   Procedure: LOWER EXTREMITY ANGIOGRAM;  Surgeon: [name], MD;  Location: [facility] CATH LAB;  Service: Cardiovascular;  Laterality: N/A;  . MICROLARYNGOSCOPY N/A 02/10/2018   Procedure: MICROLARYNGOSCOPY WITH VOCAL CORD INJECTION;  Surgeon: [name], MD;  Location: [place];  Service: ENT;  Laterality: N/A;  . PERIPHERAL VASCULAR CATHETERIZATION N/A 01/19/2016   Procedure: Abdominal Aortogram;  Surgeon: Conrad [place], MD;  Location: [place] CV LAB;  Service: Cardiovascular;  Laterality: N/A;  . PERIPHERAL VASCULAR CATHETERIZATION Bilateral 01/19/2016   Procedure: Lower Extremity Angiography;  Surgeon: Conrad , MD;  Location: [place] CV LAB;  Service: Cardiovascular;  Laterality: Bilateral;  . PV Angiogram  02/19/13   Indications: slow healing left calf ulcer  . Stress Myocardial Perfusion  12/08/2011   Indications: Abnormal EKG, Eval of prior GABG  . TEE WITHOUT CARDIOVERSION N/A 06/16/2015   Procedure: TRANSESOPHAGEAL ECHOCARDIOGRAM (TEE);  Surgeon: Satira Sark, MD;  Location: [facility]  ORS;  Service: Cardiovascular;  Laterality: N/A;  . THYROIDECTOMY N/A 09/14/2016   Procedure: TOTAL THYROIDECTOMY;  Surgeon: [name], MD;  Location: [place];  Service: ENT;  Laterality: N/A;  . TONSILLECTOMY  Age 50        Home Medications    Prior to Admission medications   Medication Sig Start Date End Date Taking? Authorizing Provider  acetaminophen (TYLENOL) 325 MG tablet Place 2 tablets (650 mg total) into feeding tube every 4 (four) hours as needed for headache or mild pain. 03/25/18   Sinda Du, MD  albuterol (PROVENTIL) (2.5 MG/3ML) 0.083% nebulizer solution Take 3 mLs (2.5 mg total) by nebulization every 6 (six) hours as needed for wheezing or shortness of breath. 03/25/18   Sinda Du, MD  apixaban (ELIQUIS) 5 MG TABS  tablet Place 1 tablet (5 mg total) into feeding tube 2 (two) times daily. 03/25/18   Sinda Du, MD  arformoterol (BROVANA) 15 MCG/2ML NEBU Take 2 mLs (15 mcg total) by nebulization 2 (two) times daily. 03/28/18   Sinda Du, MD  budesonide (PULMICORT) 0.5 MG/2ML nebulizer solution Take 2 mLs (0.5 mg total) by nebulization 2 (two) times daily. 03/25/18   Sinda Du, MD  docusate sodium (COLACE) 100 MG capsule Take 100 mg by mouth daily. Via PEG    [provider]  DULoxetine (CYMBALTA) 30 MG capsule Take 30 mg by mouth daily. Via PEG    [provider]  folic acid (FOLVITE) 1 MG tablet Place 1 tablet (1 mg total) into feeding tube daily. 03/25/18   Sinda Du, MD  furosemide (LASIX) 40 MG tablet 1 per tube daily 03/25/18   Sinda Du, MD  gabapentin (NEURONTIN) 250 MG/5ML solution Place 6 mLs (300 mg total) into feeding tube at bedtime. 03/25/18   Sinda Du, MD  ipratropium-albuterol (DUONEB) 0.5-2.5 (3) MG/3ML SOLN Take 3 mLs by nebulization 3 (three) times daily. 03/25/18   Sinda Du, MD  lenvatinib 10 mg daily dose [facility]) capsule Take 1 capsule (10 mg total) by mouth daily. Dissolve whole capsule in 15 mL of water or apple juice and leaving liquid for at least 10 minutes, then stir for at least 3 minutes and administered through PEG tube. 05/03/18   Derek Jack, MD  levothyroxine (SYNTHROID, LEVOTHROID) 75 MCG tablet Place 1 tablet (75 mcg total) into feeding tube daily before breakfast. 03/25/18   Sinda Du, MD  metoprolol tartrate (LOPRESSOR) 25 MG tablet Place 0.5 tablets (12.5 mg total) into feeding tube 2 (two) times daily. 03/25/18   Sinda Du, MD  mirtazapine (REMERON) 15 MG tablet Place 1 tablet (15 mg total) into feeding tube at bedtime. 03/25/18   Sinda Du, MD  Multiple Vitamin (MULTIVITAMIN) LIQD Place 15 mLs into feeding tube daily. 03/25/18   Sinda Du, MD  nitroGLYCERIN (NITROSTAT) 0.4 MG SL tablet Place 1  tablet (0.4 mg total) under the tongue every 5 (five) minutes as needed for chest pain (contact MD if use needed). 03/25/18   Sinda Du, MD  nutrition supplement, JUVEN, Fanny Dance) PACK Place 1 packet into feeding tube 2 (two) times daily between meals. 03/25/18   Sinda Du, MD  Nutritional Supplements (FEEDING SUPPLEMENT, JEVITY 1.2 CAL,) LIQD Place 1,000 mLs into feeding tube continuous. 03/25/18   Sinda Du, MD  pantoprazole sodium (PROTONIX) 40 mg/20 mL PACK Place 20 mLs (40 mg total) into feeding tube daily. 03/25/18   Sinda Du, MD  potassium chloride 20 MEQ/15ML (10%) SOLN 20 mEq per tube daily 03/25/18  Sinda Du, MD  potassium chloride 20 MEQ/15ML (10%) SOLN Place 15 mLs (20 mEq total) into feeding tube 2 (two) times daily. 03/28/18   Sinda Du, MD  predniSONE (STERAPRED UNI-PAK 21 TAB) 10 MG (21) TBPK tablet Take by package instructions 03/28/18   Sinda Du, MD  simvastatin (ZOCOR) 20 MG tablet Place 1 tablet (20 mg total) into feeding tube at bedtime. 03/25/18   Sinda Du, MD  traMADol (ULTRAM) 50 MG tablet Take 50 mg by mouth every 6 (six) hours as needed.    [provider]  Water For Irrigation, Sterile (FREE WATER) SOLN Place 200 mLs into feeding tube 4 (four) times daily. 03/25/18   Sinda Du, MD    Family History Family History  Problem Relation Age of Onset  . CAD Mother   . Heart disease Father        before age 54    Social History Social History   Tobacco Use  . Smoking status: Former Smoker    Packs/day: 3.00    Years: 45.00    Pack years: 135.00    Types: Cigarettes    Start date: 01/24/1955    Last attempt to quit: 01/23/2002    Years since quitting: 16.3  . Smokeless tobacco: Never Used  Substance Use Topics  . Alcohol use: No    Alcohol/week: 0.0 oz    Comment: "I quit drinking and chasing women" 30 years ago  . Drug use: No     Allergies   Patient has no known allergies.   Review of  Systems Review of Systems  Unable to perform ROS: Acuity of condition     Physical Exam Updated Vital Signs Wt 83 kg (183 lb)   BMI 27.02 kg/m   Physical Exam  Constitutional: He is oriented to person, place, and time. He appears well-developed and well-nourished.  HENT:  Head: Normocephalic and atraumatic.  Right Ear: External ear normal.  Left Ear: External ear normal.  Mouth/Throat: Oropharynx is clear and moist.  Eyes: Pupils are equal, round, and reactive to light.  Gaze to right does not pass midline to left  Neck: Neck supple. Tracheal deviation present. Thyromegaly present.  Cardiovascular: Normal rate and regular rhythm.  Pulmonary/Chest: Effort normal and breath sounds normal.  Abdominal: Soft. Bowel sounds are normal.  Tube in place Rash see skin exam  Genitourinary: Rectum normal.  Genitourinary Comments: Dark stool at rectum -heme positive  Musculoskeletal: Normal range of motion.  Neurological: He is alert and oriented to person, place, and time. He displays abnormal reflex. A cranial nerve deficit is present. He exhibits abnormal muscle tone. Coordination abnormal.  Skin: Skin is warm and dry. Capillary refill takes less than 2 seconds. Rash noted.     Rash c.w. Herpes left flank  Psychiatric: He has a normal mood and affect.  Nursing note and vitals reviewed.    ED Treatments / Results  Labs (all labs ordered are listed, but only abnormal results are displayed) Labs Reviewed - No data to display  EKG None Unable to place read in [facility] ED ECG REPORT   Date: 05/10/2018  Rate: 83  Rhythm: normal sinus rhythm  QRS Axis: normal  Intervals: normal  ST/T Wave abnormalities: ST depressions anteriorly and ST depressions laterally  Conduction Disutrbances:nonspecific intraventricular conduction delay  Narrative Interpretation:   Old EKG Reviewed: none available  I have personally reviewed the EKG tracing and agree with the computerized printout as  noted.  Radiology Dg Chest [facility]  Result Date: 05/09/2018 CLINICAL DATA:  Fever. EXAM: PORTABLE CHEST 1 VIEW COMPARISON:  PET-CT, 05/02/2018. Chest radiograph, 03/25/2018 and older exams. FINDINGS: Lung volumes are low. There are prominent bronchovascular markings. Small shaggy nodule in the left mid lung consistent with 1 of the nodules seen on the recent PET-CT. There is no evidence of pneumonia. No pulmonary edema. No pleural effusion or pneumothorax. Stable changes from prior CABG surgery. No mediastinal or hilar masses. IMPRESSION: 1. No acute cardiopulmonary disease. No evidence of pneumonia or pulmonary edema. Electronically Signed   By: Lajean Manes M.D.   On: 05/18/2018 08:33   Ct Head Code Stroke Wo Contrast`  Result Date: 05/08/2018 CLINICAL DATA:  Code stroke. Altered mental status. Metastatic papillary thyroid cancer. EXAM: CT HEAD WITHOUT CONTRAST TECHNIQUE: Contiguous axial images were obtained from the base of the skull through the vertex without intravenous contrast. COMPARISON:  Brain MRI 09/28/2011 FINDINGS: Brain: An acute right frontoparietal intraparenchymal hemorrhage/hemorrhagic mass measures 5.2 x 6.1 x 4.9 cm (approximately 78 cc). There is extensive surrounding vasogenic edema which extends inferiorly into the white matter tracts near the posterior basal ganglia as well as into the temporal and occipital lobes. There is relatively diffuse right cerebral hemispheric sulcal effacement. There is partial effacement of the right lateral ventricle, and leftward midline shift measures 6 mm. There is a 1.5 cm hyperdense mass in the subcortical posterior left frontal lobe (series 2, image 23 and series 4, image 33) with mild surrounding edema. No acute large territory cortically based infarct or extra-axial fluid collection is identified. Mild chronic small vessel ischemic disease is noted in the cerebral white matter. Vascular: Calcified atherosclerosis at the skull base. No  hyperdense vessel. Skull: No fracture or suspicious osseous lesion. Sinuses/Orbits: Trace right mastoid effusion. Clear paranasal sinuses. Bilateral cataract extraction. Other: None. ASPECTS [facility] Stroke Program Early CT Score) Not scored due to hemorrhage. IMPRESSION: 6 cm right frontoparietal hemorrhage with extensive edema and 6 mm of leftward midline shift. This most likely represents a hemorrhagic metastasis from the patient's known thyroid cancer, with a 1.5 cm metastasis also noted in the left frontal lobe. Critical Value/emergent results were called by telephone at the time of interpretation on 05/05/2018 at 7:33 am to Dr. Pattricia Boss , who verbally acknowledged these results. Electronically Signed   By: Logan Bores M.D.   On: 05/10/2018 07:37  Radiology studies personally reviewed Shiva Karis   Procedures .Critical Care Performed by: Pattricia Boss, MD Authorized by: Pattricia Boss, MD   Critical care provider statement:    Critical care time (minutes):  45   Critical care start time:  05/21/2018 7:06 AM   Critical care end time:  05/04/2018 9:17 AM   Critical care was necessary to treat or prevent imminent or life-threatening deterioration of the following conditions:  CNS failure or compromise   Critical care was time spent personally by me on the following activities:  Development of treatment plan with patient or surrogate, discussions with consultants, evaluation of patient's response to treatment, examination of patient, obtaining history from patient or surrogate, ordering and performing treatments and interventions, ordering and review of laboratory studies, ordering and review of radiographic studies, re-evaluation of patient's condition and review of old charts   (including critical care time)  Medications Ordered in ED Medications - No data to display   Initial Impression / Assessment and Plan / ED Course  I have reviewed the triage vital signs and the nursing  notes.  Pertinent labs & imaging results  that were available during my care of the patient were reviewed by me and considered in my medical decision making (see chart for details).     Discussed with Dr. Jeralyn Ruths, radiology- patient with large right frontoparietal bleed likely with met from thyroid cancer 8:46 AM Discussion with family.  Son is Pearlie Oyster who holds the power of attorney and daughter Lucrezia Europe.  Pearlie Oyster states that his father wished DNR.  We discussed likely futility of intubation or other aggressive treatment.  They wish to proceed with palliative care.  Plan consult for palliative care. Discussed with Dr. Brigitte Pulse she will see for admission Final Clinical Impressions(s) / ED Diagnoses   Final diagnoses:  Nontraumatic intracerebral hemorrhage, unspecified cerebral location, unspecified laterality [facility])  Herpes zoster without complication  Gastrointestinal hemorrhage, unspecified gastrointestinal hemorrhage type    ED Discharge Orders    None       Pattricia Boss, MD 05/03/2018 0964    Pattricia Boss, MD 05/24/2018 [phone]

## 2018-05-21 NOTE — ED Triage Notes (Signed)
Per ems pt was LKW at 1am when they gave him tramadol for pain.  Reports they checked on him at 6am and noticed pt had altered loc, left sided facial droop, left sided weakness, difficulty speaking, r sided gaze.   CBG 155.

## 2018-05-21 NOTE — Consult Note (Signed)
[facility] TeleNeurology Consult Services Date of service: 05/16/2018  Impression: 82 year old male with a history of Atrial flutter on Eliquis and metastatic thyroid cancer who presents to the hospital because he was found with left side weakness and right gaze deviation. CT Brain consistent with hemorrhagic mets.    Not a tpa candidate due to: Intracranial hemorrhage Not an NIR candidate due to: Symptoms not consistent with LVO    Differential Diagnosis:   1. Hypertensive hemorrhage 2. Bleeding mets  Comments:   [facility] contacted: 7:33 [facility] at bedside: 7:40 NIHSS assessment time: 7:42  Recommendations:  SBP < 140 Hold antiplatelets and anticoagulation Consider goals of care discussion with family If family would prefer to be aggressive can consider Neurosurgery consult ICH Score -- 4 Discussed with ED MD  -----------------------------------------------------------------------------------------  CC: Stroke alert  History of Present Illness   Patient is an 82 year old with a history of metastatic thyroid cancer and atrial flutter on Eliquis who presents to the hospital after being found unresponsive with left side flaccid and right gaze deviation.     Diagnostic: CT Brain w/o contrast: IMPRESSION: 6 cm right frontoparietal hemorrhage with extensive edema and 6 mm of leftward midline shift. This most likely represents a hemorrhagic metastasis from the patient's known thyroid cancer, with a 1.5 cm metastasis also noted in the left frontal lobe.  Exam:  NIH Stroke Scale   Interval: Baseline Time: 7:57 AM Person Administering Scale: Tsosie Billing  Administer stroke scale items in the order listed. Record performance in each category after each subscale exam. Do not go back and change scores. Follow directions provided for each exam technique. Scores should reflect what the patient does, not what the clinician thinks the patient can do. The  clinician should record answers while administering the exam and work quickly. Except where indicated, the patient should not be coached (i.e., repeated requests to patient to make a special effort).   1a  Level of consciousness: 2=not alert, requires repeated stimulation to attend, or is obtunded and requires strong or painful stimulation to make movements (not stereotyped)  1b. LOC questions:  2=Performs neither task correctly  1c. LOC commands: 2=Performs neither task correctly  2.  Best Gaze: 2=forced deviation, or total gaze paresis not overcome by oculocephalic maneuver  3.  Visual: 2=Complete hemianopia  4. Facial Palsy: 2=Partial paralysis (total or near total paralysis of the lower face)  5a.  Motor left arm: 4=No movement  5b.  Motor right arm: 3=No effort against gravity, limb falls  6a. motor left leg: 4=No movement  6b  Motor right leg:  3=No effort against gravity, limb falls  7. Limb Ataxia: 0=Absent  8.  Sensory: 2=Severe to total sensory loss; patient is not aware of being touched in face, arm, leg  9. Best Language:  3=Mute, global aphasia; no usable speech or auditory comprehension  10. Dysarthria: 2=Severe; patient speech is so slurred as to be unintelligible in the absence of or our of proportion to any dysphagia, or is mute/anarthric  11. Extinction and Inattention: 2=Profound hemi-inattention or hemi-inattention to more than one modality. Does not recognize own hand or orients only to one side of space  12. Distal motor function: 2=No voluntary extension after 5 seconds. Movement of the fingers at another time are not scored   Total:   35    NIHSS score: 35      Medical Decision Making:  - Extensive number of diagnosis or management options are considered above.   -  Extensive amount of complex data reviewed.   - High risk of complication and/or morbidity or mortality are associated with differential diagnostic considerations above.  - There may be Uncertain  outcome and increased probability of prolonged functional impairment or high probability of severe prolonged functional impairment associated with some of these differential diagnosis.  Medical Data Reviewed:  1.Data reviewed include clinical labs, radiology,  Medical Tests;   2.Tests results discussed w/performing or interpreting physician;   3.Obtaining/reviewing old medical records;  4.Obtaining case history from another source;  5.Independent review of image, tracing or specimen.    Patient was informed the Neurology Consult would happen via telehealth (remote video) and consented to receiving care in this manner.

## 2018-05-21 NOTE — Progress Notes (Signed)
Start time 0711 Exam started 0711 Exam finished 0713 Images sent to [facility] Exam sent to soc and completed in epic and radiologiest called 0715

## 2018-05-21 NOTE — ED Notes (Signed)
Pt turned and repositioned in bed.

## 2018-05-21 NOTE — H&P (Addendum)
History and Physical  John Sampson KVQ:[id number] DOB: 03-31-1936 DOA: 05/20/2018  Referring physician: EDP PCP: Sinda Du, MD   From SNF  Chief Complaint: left sided weakness, right sided gaze  HPI: John Sampson is a 82 y.o. male history of PAF on Eliquis, diastolic CHF, COPD, tonic hypoxic respiratory failure on home oxygen, h/o metastatic papillary thyroid cancer  to the bones, liver, thoracic and abdominal lymph nodes, soft tissues and lungs, He is sent from skilled nursing facility to any pain ED due to Found at 0610 by nurse, unresponsive and "not his normal self.",  left sided facial droop, left sided weakness, difficulty speaking, r sided gaze. RN at snf also reports pt has rash to left side (?shingles).   ED course: Stat CT brain consistent with hemorrhagic meds.  Telemetry neuro consulted, patient is not a candidate for TPA.  Symptoms not consistent with large vessel occlusion.  Neurology recommended goals of care with family.  EDP gave patient Concentra.    While in the ED patient become more lethargic, has increased oral secretions. family decided to start comfort measures.  Hospitalist called to further manage the patient.   Review of Systems:  Detail per HPI, Review of systems are otherwise negative  Past Medical History:  Diagnosis Date  . Atrial flutter ([place])    on Eliquis  . AV malformation of GI tract   . CHF (congestive heart failure) ([place])   . COPD (chronic obstructive pulmonary disease) ([place]) 3.12.14   2D Echo, EF 50-55%  . Coronary atherosclerosis of native coronary artery    Multivessel status post CABG  . Dysphagia   . Gastric ulcer    Small - nonbleeding  . GERD (gastroesophageal reflux disease)   . Headache(784.0)   . Hypothyroidism   . Iron deficiency anemia    Negative Givens capsule study   . Myocardial infarction ([place])   . PAD (peripheral artery disease) ([facility])    Moderate bilateral SFA disease at angiography 01/2013  . Paralysis of vocal  cords   . Pituitary macroadenoma ([place])   . Thyroid cancer ([place])   . Vocal cord paralysis    left   Past Surgical History:  Procedure Laterality Date  . ABDOMINAL AORTAGRAM N/A 02/19/2013   Procedure: ABDOMINAL Maxcine Ham;  Surgeon: [name], MD;  Location: [facility] CATH LAB;  Service: Cardiovascular;  Laterality: N/A;  . CARDIAC CATHETERIZATION    . CARDIOVERSION N/A 06/16/2015   Procedure: CARDIOVERSION;  Surgeon: Satira Sark, MD;  [facility]: [facility];  Service: Cardiovascular;  Laterality: N/A;  . COLONOSCOPY  08/18/2012   Procedure: COLONOSCOPY;  Surgeon: [name], MD;  [facility];  Service: Endoscopy;  Laterality: N/A;  1:25  . COLONOSCOPY N/A 09/12/2017   Procedure: COLONOSCOPY;  Surgeon: [name], MD;  [facility];  Service: Endoscopy;  Laterality: N/A;  . CORONARY ARTERY BYPASS GRAFT  2003   5 grafts - details not clear  . CRANIOTOMY  12/31/2011   Procedure: CRANIOTOMY HYPOPHYSECTOMY TRANSNASAL APPROACH;  Surgeon: [name], MD;  Location: [place] NEURO ORS;  Service: Neurosurgery;  Laterality: N/A;  Transphenoidal Hypophysectomy With Fat Graft Harvest from right abdomen   . ESOPHAGOGASTRODUODENOSCOPY  08/18/2012   Procedure: ESOPHAGOGASTRODUODENOSCOPY (EGD);  Surgeon: [name], MD;  [facility];  Service: Endoscopy;  Laterality: N/A;  . ESOPHAGOGASTRODUODENOSCOPY N/A 03/31/2017   Procedure: ESOPHAGOGASTRODUODENOSCOPY (EGD);  Surgeon: [name], MD;  [facility];  Service: Endoscopy;  Laterality: N/A;  . ESOPHAGOGASTRODUODENOSCOPY N/A 09/12/2017   Procedure: ESOPHAGOGASTRODUODENOSCOPY (EGD);  Surgeon: [name], MD;  [facility];  Service: Endoscopy;  Laterality: N/A;  10:40  . EYE SURGERY  2012  . GIVENS CAPSULE STUDY N/A 01/25/2013   Procedure: GIVENS CAPSULE STUDY;  Surgeon: [name], MD;  [facility];  Service: Endoscopy;  Laterality: N/A;  730  . HOT HEMOSTASIS   03/31/2017   Procedure: HOT HEMOSTASIS (ARGON PLASMA COAGULATION/BICAP);  Surgeon: [name], MD;  [facility];  Service: Endoscopy;;  duodenum  . IR GASTROSTOMY TUBE MOD SED  02/24/2018  . LARYNGOPLASTY Left 09/14/2016   Procedure: LARYNGOPLASTY;  Surgeon: [name], MD;  Location: [place];  Service: ENT;  Laterality: Left;  . LOWER EXTREMITY ANGIOGRAM N/A 02/19/2013   Procedure: LOWER EXTREMITY ANGIOGRAM;  Surgeon: [name], MD;  Location: [facility] CATH LAB;  Service: Cardiovascular;  Laterality: N/A;  . MICROLARYNGOSCOPY N/A 02/10/2018   Procedure: MICROLARYNGOSCOPY WITH VOCAL CORD INJECTION;  Surgeon: [name], MD;  Location: [place];  Service: ENT;  Laterality: N/A;  . PERIPHERAL VASCULAR CATHETERIZATION N/A 01/19/2016   Procedure: Abdominal Aortogram;  Surgeon: Conrad Westport, MD;  Location: [place] CV LAB;  Service: Cardiovascular;  Laterality: N/A;  . PERIPHERAL VASCULAR CATHETERIZATION Bilateral 01/19/2016   Procedure: Lower Extremity Angiography;  Surgeon: Conrad Rosser, MD;  Location: [place] CV LAB;  Service: Cardiovascular;  Laterality: Bilateral;  . PV Angiogram  02/19/13   Indications: slow healing left calf ulcer  . Stress Myocardial Perfusion  12/08/2011   Indications: Abnormal EKG, Eval of prior GABG  . TEE WITHOUT CARDIOVERSION N/A 06/16/2015   Procedure: TRANSESOPHAGEAL ECHOCARDIOGRAM (TEE);  Surgeon: Satira Sark, MD;  [facility];  Service: Cardiovascular;  Laterality: N/A;  . THYROIDECTOMY N/A 09/14/2016   Procedure: TOTAL THYROIDECTOMY;  Surgeon: [name], MD;  Location: [place];  Service: ENT;  Laterality: N/A;  . TONSILLECTOMY  Age 85   Social History:  reports that he quit smoking about 16 years ago. His smoking use included cigarettes. He started smoking about 63 years ago. He has a 135.00 pack-year smoking history. He has never used smokeless tobacco. He reports that he does not drink alcohol  or use drugs. Patient lives at SNF dependent with ADLs  No Known Allergies  Family History  Problem Relation Age of Onset  . CAD Mother   . Heart disease Father        before age 74      Prior to Admission medications   Medication Sig Start Date End Date Taking? Authorizing Provider  acetaminophen (TYLENOL) 325 MG tablet Place 2 tablets (650 mg total) into feeding tube every 4 (four) hours as needed for headache or mild pain. 03/25/18   Sinda Du, MD  albuterol (PROVENTIL) (2.5 MG/3ML) 0.083% nebulizer solution Take 3 mLs (2.5 mg total) by nebulization every 6 (six) hours as needed for wheezing or shortness of breath. 03/25/18   Sinda Du, MD  apixaban (ELIQUIS) 5 MG TABS tablet Place 1 tablet (5 mg total) into feeding tube 2 (two) times daily. 03/25/18   Sinda Du, MD  arformoterol (BROVANA) 15 MCG/2ML NEBU Take 2 mLs (15 mcg total) by nebulization 2 (two) times daily. 03/28/18   Sinda Du, MD  budesonide (PULMICORT) 0.5 MG/2ML nebulizer solution Take 2 mLs (0.5 mg total) by nebulization 2 (two) times daily. 03/25/18   Sinda Du, MD  docusate sodium (COLACE) 100 MG capsule Take 100 mg by mouth daily. Via PEG    [provider]  DULoxetine (CYMBALTA) 30 MG capsule Take 30 mg by mouth daily. Via PEG    [provider]  folic acid (FOLVITE) 1 MG tablet Place 1 tablet (1 mg total) into feeding tube daily. 03/25/18   Sinda Du, MD  furosemide (LASIX) 40 MG tablet 1 per tube daily 03/25/18   Sinda Du, MD  gabapentin (NEURONTIN) 250 MG/5ML solution Place 6 mLs (300 mg total) into feeding tube at bedtime. 03/25/18   Sinda Du, MD  ipratropium-albuterol (DUONEB) 0.5-2.5 (3) MG/3ML SOLN Take 3 mLs by nebulization 3 (three) times daily. 03/25/18   Sinda Du, MD  lenvatinib 10 mg daily dose [facility]) capsule Take 1 capsule (10 mg total) by mouth daily. Dissolve whole capsule in 15 mL of water or apple juice and leaving liquid for at least  10 minutes, then stir for at least 3 minutes and administered through PEG tube. 05/03/18   Derek Jack, MD  levothyroxine (SYNTHROID, LEVOTHROID) 75 MCG tablet Place 1 tablet (75 mcg total) into feeding tube daily before breakfast. 03/25/18   Sinda Du, MD  metoprolol tartrate (LOPRESSOR) 25 MG tablet Place 0.5 tablets (12.5 mg total) into feeding tube 2 (two) times daily. 03/25/18   Sinda Du, MD  mirtazapine (REMERON) 15 MG tablet Place 1 tablet (15 mg total) into feeding tube at bedtime. 03/25/18   Sinda Du, MD  Multiple Vitamin (MULTIVITAMIN) LIQD Place 15 mLs into feeding tube daily. 03/25/18   Sinda Du, MD  nitroGLYCERIN (NITROSTAT) 0.4 MG SL tablet Place 1 tablet (0.4 mg total) under the tongue every 5 (five) minutes as needed for chest pain (contact MD if use needed). 03/25/18   Sinda Du, MD  nutrition supplement, JUVEN, Fanny Dance) PACK Place 1 packet into feeding tube 2 (two) times daily between meals. 03/25/18   Sinda Du, MD  Nutritional Supplements (FEEDING SUPPLEMENT, JEVITY 1.2 CAL,) LIQD Place 1,000 mLs into feeding tube continuous. 03/25/18   Sinda Du, MD  pantoprazole sodium (PROTONIX) 40 mg/20 mL PACK Place 20 mLs (40 mg total) into feeding tube daily. 03/25/18   Sinda Du, MD  potassium chloride 20 MEQ/15ML (10%) SOLN 20 mEq per tube daily 03/25/18   Sinda Du, MD  potassium chloride 20 MEQ/15ML (10%) SOLN Place 15 mLs (20 mEq total) into feeding tube 2 (two) times daily. 03/28/18   Sinda Du, MD  predniSONE (STERAPRED UNI-PAK 21 TAB) 10 MG (21) TBPK tablet Take by package instructions 03/28/18   Sinda Du, MD  simvastatin (ZOCOR) 20 MG tablet Place 1 tablet (20 mg total) into feeding tube at bedtime. 03/25/18   Sinda Du, MD  traMADol (ULTRAM) 50 MG tablet Take 50 mg by mouth every 6 (six) hours as needed.    [provider]  Water For Irrigation, Sterile (FREE WATER) SOLN Place 200 mLs into feeding tube 4  (four) times daily. 03/25/18   Sinda Du, MD    Physical Exam: BP (!) 123/50   Pulse 82   Temp 100 F (37.8 C) (Rectal)   Resp (!) 22   Wt 83 kg (183 lb)   SpO2 97%   BMI 27.02 kg/m   General:  Lethargic, chronically ill appearing, does not follow commands Eyes: right sided gase ENT: + oral secreation Neck: supple, no JVD Cardiovascular: RRR Respiratory: gurgling, upper airway sounds Abdomen: soft/NT/ND, positive bowel sounds, + feeding tube Skin: left flank rash in dermatomal distribution  Musculoskeletal:  No edema Psychiatric: lethargic Neurologic: lethargic, right sided gaze, not follow commands          Labs on Admission:  Basic Metabolic Panel: Recent Labs  Lab 05/05/2018 0745  NA 137  K 4.2  CL 98*  CO2 27  GLUCOSE 126*  BUN 28*  CREATININE 0.81  CALCIUM 9.2   Liver Function Tests: Recent Labs  Lab 05/10/2018 0745  AST 34  ALT 26  ALKPHOS 85  BILITOT 0.6  PROT 6.9  ALBUMIN 3.6   No results for input(s): LIPASE, AMYLASE in the last 168 hours. No results for input(s): AMMONIA in the last 168 hours. CBC: Recent Labs  Lab 05/22/2018 0745  WBC 8.4  NEUTROABS 6.5  HGB 10.2*  HCT 34.5*  MCV 98.9  PLT 319   Cardiac Enzymes: No results for input(s): CKTOTAL, CKMB, CKMBINDEX, TROPONINI in the last 168 hours.  BNP (last 3 results) Recent Labs    12/06/17 1232 03/19/18 1516  BNP 305.0* 395.0*    ProBNP (last 3 results) No results for input(s): PROBNP in the last 8760 hours.  CBG: Recent Labs  Lab 05/26/2018 0746  GLUCAP 131*    Radiological Exams on Admission: Dg Chest Port 1 View  Result Date: 05/13/2018 CLINICAL DATA:  Fever. EXAM: PORTABLE CHEST 1 VIEW COMPARISON:  PET-CT, 05/02/2018. Chest radiograph, 03/25/2018 and older exams. FINDINGS: Lung volumes are low. There are prominent bronchovascular markings. Small shaggy nodule in the left mid lung consistent with 1 of the nodules seen on the recent PET-CT. There is no evidence of  pneumonia. No pulmonary edema. No pleural effusion or pneumothorax. Stable changes from prior CABG surgery. No mediastinal or hilar masses. IMPRESSION: 1. No acute cardiopulmonary disease. No evidence of pneumonia or pulmonary edema. Electronically Signed   By: Lajean Manes M.D.   On: 05/13/2018 08:33   Ct Head Code Stroke Wo Contrast`  Result Date: 05/20/2018 CLINICAL DATA:  Code stroke. Altered mental status. Metastatic papillary thyroid cancer. EXAM: CT HEAD WITHOUT CONTRAST TECHNIQUE: Contiguous axial images were obtained from the base of the skull through the vertex without intravenous contrast. COMPARISON:  Brain MRI 09/28/2011 FINDINGS: Brain: An acute right frontoparietal intraparenchymal hemorrhage/hemorrhagic mass measures 5.2 x 6.1 x 4.9 cm (approximately 78 cc). There is extensive surrounding vasogenic edema which extends inferiorly into the white matter tracts near the posterior basal ganglia as well as into the temporal and occipital lobes. There is relatively diffuse right cerebral hemispheric sulcal effacement. There is partial effacement of the right lateral ventricle, and leftward midline shift measures 6 mm. There is a 1.5 cm hyperdense mass in the subcortical posterior left frontal lobe (series 2, image 23 and series 4, image 33) with mild surrounding edema. No acute large territory cortically based infarct or extra-axial fluid collection is identified. Mild chronic small vessel ischemic disease is noted in the cerebral white matter. Vascular: Calcified atherosclerosis at the skull base. No hyperdense vessel. Skull: No fracture or suspicious osseous lesion. Sinuses/Orbits: Trace right mastoid effusion. Clear paranasal sinuses. Bilateral cataract extraction. Other: None. ASPECTS [facility] Stroke Program Early CT Score) Not scored due to hemorrhage. IMPRESSION: 6 cm right frontoparietal hemorrhage with extensive edema and 6 mm of leftward midline shift. This most likely represents a hemorrhagic  metastasis from the patient's known thyroid cancer, with a 1.5 cm metastasis also noted in the left frontal lobe. Critical Value/emergent results were called by telephone at the time of interpretation on 05/06/2018 at 7:33 am to Dr. Pattricia Boss ,  who verbally acknowledged these results. Electronically Signed   By: Logan Bores M.D.   On: 05/24/2018 07:37    EKG: Independently reviewed. Sinus rhythm, no acute st/t changes  Assessment/Plan Present on Admission: **None**  Hemorrhagic mets with h/o metastatic papillary thyroid carcinoma -ct head "6 cm right frontoparietal hemorrhage with extensive edema and 6 mm of leftward midline shift." "a 1.5 cm metastasis also noted in the left frontal lobe." -He received Concentra in the ED -Not a candidate for TPA, not a candidate for IR intervention.  Had a neurology consulted. -Start comfort measures his PRN Ativan and as needed morphine, I have explained to the family that possibly inhospital death from hours to no more than days, family expressed understanding.   Met papillary thyroid carcinoma: -Thyroidectomy and lymph node biopsy on 09/14/2016, consistent with papillary thyroid carcinoma, margins positive, one lymph node positive -now with brain mets   Singles: contact precaution, comfort measures.  H/oPAF, h/o copd on home o2 now comfort measures.     DVT prophylaxis: comfort measures  Consultants: tele neurology  Code Status: DNR  Family Communication:  Patient and family at bedside  Disposition Plan: admit to med surg  Time spent: 17mns  FFlorencia ReasonsMD, PhD [facility] Pager [phone] 7PM-7AM, please contact night-coverage at [email or web address], password [email or web address]

## 2018-05-21 NOTE — ED Notes (Signed)
Report recieved 

## 2018-05-22 ENCOUNTER — Other Ambulatory Visit: Payer: Self-pay

## 2018-05-22 MED ORDER — ACETAMINOPHEN 650 MG RE SUPP
650.0000 mg | Freq: Four times a day (QID) | RECTAL | Status: DC | PRN
  Administered 2018-05-22 – 2018-05-25 (×9): 650 mg via RECTAL
  Filled 2018-05-22 (×9): qty 1

## 2018-05-22 NOTE — NC FL2 (Signed)
[place] LEVEL OF CARE SCREENING TOOL     IDENTIFICATION  Patient Name: John Sampson Birthdate: Jan 22, 1936 Sex: male Admission Date (Current Location): 05/12/2018  [facility] and [place] Number:  [employer] and Address:  [place] [address], [place]      Provider Number: [id number]  Attending Physician Name and Address:  Sinda Du, MD  Relative Name and Phone Number:       Current Level of Care: Hospital Recommended Level of Care: [place] Prior Approval Number:    Date Approved/Denied:   PASRR Number:    Discharge Plan: SNF    Current Diagnoses: Patient Active Problem List   Diagnosis Date Noted  . Hemorrhagic stroke ([place]) 05/23/2018  . Papillary thyroid carcinoma ([place]) 04/21/2018  . Dyspnea   . Pressure injury of skin 03/22/2018  . Acute on chronic respiratory failure with hypoxia ([place]) 03/19/2018  . Elevated troponin 03/19/2018  . Lactic acid acidosis 03/19/2018  . Protein-calorie malnutrition, severe 02/17/2018  . Dysphagia 02/14/2018  . Hypothyroidism (acquired) 02/14/2018  . Abrasion of lower leg, left, initial encounter 02/14/2018  . Preoperative clearance 01/11/2018  . Bilateral carotid bruits 01/11/2018  . CAD (coronary artery disease) 01/10/2018  . Iron deficiency anemia due to chronic blood loss 08/24/2017  . GI bleeding 03/31/2017  . GI bleed 03/30/2017  . Thyroid cancer ([place]) 09/14/2016  . HCAP (healthcare-associated pneumonia) 08/24/2016  . Pneumonia 08/23/2016  . Acute on chronic diastolic CHF (congestive heart failure) ([place]) 07/24/2016  . Cellulitis of right arm 07/20/2016  . Cellulitis and abscess of hand 07/20/2016  . Venous stasis ulcers ([place]) 12/24/2015  . Chronic venous insufficiency 12/24/2015  . Atherosclerosis of extremity with ulceration ([place]) 12/24/2015  . CHF (congestive heart failure) ([place]) 12/24/2015  . Critical lower limb ischemia 12/24/2015  .  Obesity 12/24/2015  . PAF (paroxysmal atrial fibrillation) ([place]) 12/24/2015  . PAD (peripheral artery disease) ([place]) 06/07/2014  . COPD (chronic obstructive pulmonary disease) ([place]) 01/23/2013  . Anemia 08/03/2012  . Essential hypertension, benign 08/03/2012  . Mixed hyperlipidemia 08/03/2012  . Hx of CABG 08/03/2012    Orientation RESPIRATION BLADDER Height & Weight        O2(4L) Indwelling catheter Weight: 183 lb (83 kg) Height:     BEHAVIORAL SYMPTOMS/MOOD NEUROLOGICAL BOWEL NUTRITION STATUS      Continent Diet(see discharge summary)  AMBULATORY STATUS COMMUNICATION OF NEEDS Skin   Extensive Assist Verbally PU Stage and Appropriate Care(Stage III: left heel)                       Personal Care Assistance Level of Assistance  Bathing, Feeding, Dressing Bathing Assistance: Limited assistance Feeding assistance: Independent Dressing Assistance: Limited assistance     Functional Limitations Info  Sight, Hearing, Speech Sight Info: Adequate Hearing Info: Adequate Speech Info: Adequate    SPECIAL CARE FACTORS FREQUENCY                       Contractures      Additional Factors Info  Code Status, Allergies, Psychotropic Code Status Info: DNR Allergies Info: NKA Psychotropic Info: Cymbalta, Remeron         Current Medications (05/22/2018):  This is the current hospital active medication list Current Facility-Administered Medications  Medication Dose Route Frequency Provider Last Rate Last Dose  . acetaminophen (TYLENOL) suppository 650 mg  650 mg Rectal Q6H PRN Heath Lark D, DO  650 mg at 05/22/18 0249  . albuterol (PROVENTIL) (2.5 MG/3ML) 0.083% nebulizer solution 2.5 mg  2.5 mg Nebulization Q6H PRN Florencia Reasons, MD      . antiseptic oral rinse (BIOTENE) solution 15 mL  15 mL Topical PRN Florencia Reasons, MD      . glycopyrrolate (ROBINUL) tablet 1 mg  1 mg Oral Q4H PRN Florencia Reasons, MD       Or  . glycopyrrolate (ROBINUL) injection 0.2 mg  0.2 mg Subcutaneous Q4H  PRN Florencia Reasons, MD       Or  . glycopyrrolate (ROBINUL) injection 0.2 mg  0.2 mg Intravenous Q4H PRN Florencia Reasons, MD   0.2 mg at 05/22/18 0007  . ipratropium-albuterol (DUONEB) 0.5-2.5 (3) MG/3ML nebulizer solution 3 mL  3 mL Nebulization Q6H PRN Florencia Reasons, MD      . LORazepam (ATIVAN) tablet 1 mg  1 mg Oral Q4H PRN Florencia Reasons, MD       Or  . LORazepam (ATIVAN) 2 MG/ML concentrated solution 1 mg  1 mg Sublingual Q4H PRN Florencia Reasons, MD       Or  . LORazepam (ATIVAN) injection 1 mg  1 mg Intravenous Q4H PRN Florencia Reasons, MD      . morphine 2 MG/ML injection 1 mg  1 mg Intravenous Q2H PRN Florencia Reasons, MD   1 mg at 05/22/18 0825  . ondansetron (ZOFRAN-ODT) disintegrating tablet 4 mg  4 mg Oral Q6H PRN Florencia Reasons, MD       Or  . ondansetron [facility]) injection 4 mg  4 mg Intravenous Q6H PRN Florencia Reasons, MD   4 mg at 05/05/2018 1436  . polyvinyl alcohol (LIQUIFILM TEARS) 1.4 % ophthalmic solution 1 drop  1 drop Both Eyes QID PRN Florencia Reasons, MD         Discharge Medications: Please see discharge summary for a list of discharge medications.  Relevant Imaging Results:  Relevant Lab Results:   Additional Information SS# [id number], Clydene Pugh, LCSW

## 2018-05-22 NOTE — Progress Notes (Signed)
Subjective: He was admitted with hemorrhagic metastatic thyroid cancer.  He has had a significant stroke.  He is on comfort care.  Objective: Vital signs in last 24 hours: Temp:  [97.9 F (36.6 C)-102.8 F (39.3 C)] 99 F (37.2 C) (06/24 0700) Pulse Rate:  [78-114] 105 (06/24 0558) Resp:  [21-35] 28 (06/24 0558) BP: (81-219)/(42-207) 88/42 (06/24 0558) SpO2:  [85 %-100 %] 85 % (06/24 0558) Weight change:     Intake/Output from previous day: 06/23 0701 - 06/24 0700 In: 125 [IV Piggyback:125] Out: -   PHYSICAL EXAM General appearance: He is sleeping looks comfortable Resp: clear to auscultation bilaterally Cardio: regular rate and rhythm, S1, S2 normal, no murmur, click, rub or gallop GI: soft, non-tender; bowel sounds normal; no masses,  no organomegaly Extremities: He has patches on his legs Dermatome distribution of rash probably shingles  Lab Results:  Results for orders placed or performed during the hospital encounter of 05/01/2018 (from the past 48 hour(s))  Protime-INR     Status: Abnormal   Collection Time: 05/22/2018  7:45 AM  Result Value Ref Range   Prothrombin Time 17.0 (H) 11.4 - 15.2 seconds   INR 1.40     Comment: Performed at [facility], [address], [place], [place] [address]  APTT     Status: Abnormal   Collection Time: 05/20/2018  7:45 AM  Result Value Ref Range   aPTT 50 (H) 24 - 36 seconds    Comment:        IF BASELINE aPTT IS ELEVATED, SUGGEST PATIENT RISK ASSESSMENT BE USED TO DETERMINE APPROPRIATE ANTICOAGULANT THERAPY. Performed at [facility], [address], [place], [place] [address]   CBC     Status: Abnormal   Collection Time: 05/04/2018  7:45 AM  Result Value Ref Range   WBC 8.4 4.0 - 10.5 K/uL   RBC 3.49 (L) 4.22 - 5.81 MIL/uL   Hemoglobin 10.2 (L) 13.0 - 17.0 g/dL   HCT 34.5 (L) 39.0 - 52.0 %   MCV 98.9 78.0 - 100.0 fL   MCH 29.2 26.0 - 34.0 pg   MCHC 29.6 (L) 30.0 - 36.0 g/dL   RDW 21.6 (H) 11.5 - 15.5 %   Platelets 319 150 -  400 K/uL    Comment: Performed at [facility], [address] [place], [place] [address]  Ethanol     Status: None   Collection Time: 05/18/2018  7:45 AM  Result Value Ref Range   Alcohol, Ethyl (B) <10 <10 mg/dL    Comment: (NOTE) Lowest detectable limit for serum alcohol is 10 mg/dL. For medical purposes only. Performed at [facility], [address] [place], [place] [address]   Differential     Status: None   Collection Time: 05/17/2018  7:45 AM  Result Value Ref Range   Neutrophils Relative % 79 %   Lymphocytes Relative 9 %   Monocytes Relative 9 %   Eosinophils Relative 3 %   Basophils Relative 0 %   Neutro Abs 6.5 1.7 - 7.7 K/uL   Lymphs Abs 0.8 0.7 - 4.0 K/uL   Monocytes Absolute 0.8 0.1 - 1.0 K/uL   Eosinophils Absolute 0.3 0.0 - 0.7 K/uL   Basophils Absolute 0.0 0.0 - 0.1 K/uL   RBC Morphology ANISOCYTES     Comment: Performed at [facility], [address] [place], [place] [address]  Comprehensive metabolic panel     Status: Abnormal   Collection Time: 05/07/2018  7:45 AM  Result Value  Ref Range   Sodium 137 135 - 145 mmol/L   Potassium 4.2 3.5 - 5.1 mmol/L   Chloride 98 (L) 101 - 111 mmol/L   CO2 27 22 - 32 mmol/L   Glucose, Bld 126 (H) 65 - 99 mg/dL   BUN 28 (H) 6 - 20 mg/dL   Creatinine, Ser 0.81 0.61 - 1.24 mg/dL   Calcium 9.2 8.9 - 10.3 mg/dL   Total Protein 6.9 6.5 - 8.1 g/dL   Albumin 3.6 3.5 - 5.0 g/dL   AST 34 15 - 41 U/L   ALT 26 17 - 63 U/L   Alkaline Phosphatase 85 38 - 126 U/L   Total Bilirubin 0.6 0.3 - 1.2 mg/dL   GFR calc non Af Amer >60 >60 mL/min   GFR calc Af Amer >60 >60 mL/min    Comment: (NOTE) The eGFR has been calculated using the CKD EPI equation. This calculation has not been validated in all clinical situations. eGFR's persistently <60 mL/min signify possible Chronic Kidney Disease.    Anion gap 12 5 - 15    Comment: Performed at [facility], [address], [place], [place] [address]  CBG monitoring, ED     Status: Abnormal    Collection Time: 05/06/2018  7:46 AM  Result Value Ref Range   Glucose-Capillary 131 (H) 65 - 99 mg/dL  I-stat troponin, ED     Status: None   Collection Time: 05/20/2018  8:13 AM  Result Value Ref Range   Troponin i, poc 0.01 0.00 - 0.08 ng/mL   Comment 3            Comment: Due to the release kinetics of cTnI, a negative result within the first hours of the onset of symptoms does not rule out myocardial infarction with certainty. If myocardial infarction is still suspected, repeat the test at appropriate intervals.   POC occult blood, ED     Status: Abnormal   Collection Time: 05/06/2018  9:31 AM  Result Value Ref Range   Fecal Occult Bld POSITIVE (A) NEGATIVE    ABGS No results for input(s): PHART, PO2ART, TCO2, HCO3 in the last 72 hours.  Invalid input(s): PCO2 CULTURES No results found for this or any previous visit (from the past 240 hour(s)). Studies/Results: Dg Chest Port 1 View  Result Date: 05/23/2018 CLINICAL DATA:  Fever. EXAM: PORTABLE CHEST 1 VIEW COMPARISON:  PET-CT, 05/02/2018. Chest radiograph, 03/25/2018 and older exams. FINDINGS: Lung volumes are low. There are prominent bronchovascular markings. Small shaggy nodule in the left mid lung consistent with 1 of the nodules seen on the recent PET-CT. There is no evidence of pneumonia. No pulmonary edema. No pleural effusion or pneumothorax. Stable changes from prior CABG surgery. No mediastinal or hilar masses. IMPRESSION: 1. No acute cardiopulmonary disease. No evidence of pneumonia or pulmonary edema. Electronically Signed   By: Lajean Manes M.D.   On: 05/12/2018 08:33   Ct Head Code Stroke Wo Contrast`  Result Date: 05/11/2018 CLINICAL DATA:  Code stroke. Altered mental status. Metastatic papillary thyroid cancer. EXAM: CT HEAD WITHOUT CONTRAST TECHNIQUE: Contiguous axial images were obtained from the base of the skull through the vertex without intravenous contrast. COMPARISON:  Brain MRI 09/28/2011 FINDINGS: Brain: An  acute right frontoparietal intraparenchymal hemorrhage/hemorrhagic mass measures 5.2 x 6.1 x 4.9 cm (approximately 78 cc). There is extensive surrounding vasogenic edema which extends inferiorly into the white matter tracts near the posterior basal ganglia as well as into the temporal and occipital lobes. There  is relatively diffuse right cerebral hemispheric sulcal effacement. There is partial effacement of the right lateral ventricle, and leftward midline shift measures 6 mm. There is a 1.5 cm hyperdense mass in the subcortical posterior left frontal lobe (series 2, image 23 and series 4, image 33) with mild surrounding edema. No acute large territory cortically based infarct or extra-axial fluid collection is identified. Mild chronic small vessel ischemic disease is noted in the cerebral white matter. Vascular: Calcified atherosclerosis at the skull base. No hyperdense vessel. Skull: No fracture or suspicious osseous lesion. Sinuses/Orbits: Trace right mastoid effusion. Clear paranasal sinuses. Bilateral cataract extraction. Other: None. ASPECTS [facility] Stroke Program Early CT Score) Not scored due to hemorrhage. IMPRESSION: 6 cm right frontoparietal hemorrhage with extensive edema and 6 mm of leftward midline shift. This most likely represents a hemorrhagic metastasis from the patient's known thyroid cancer, with a 1.5 cm metastasis also noted in the left frontal lobe. Critical Value/emergent results were called by telephone at the time of interpretation on 05/08/2018 at 7:33 am to Dr. Pattricia Boss , who verbally acknowledged these results. Electronically Signed   By: Logan Bores M.D.   On: 05/19/2018 07:37    Medications:  Prior to Admission:  Medications Prior to Admission  Medication Sig Dispense Refill Last Dose  . acetaminophen (TYLENOL) 325 MG tablet Place 2 tablets (650 mg total) into feeding tube every 4 (four) hours as needed for headache or mild pain.   Past Month at Unknown time  . albuterol  (PROVENTIL) (2.5 MG/3ML) 0.083% nebulizer solution Take 2.5 mg by nebulization every 6 (six) hours as needed for wheezing or shortness of breath. 11m inhale orally every 6 hours as needed for wheezing and shortness of breath   unknown  . apixaban (ELIQUIS) 5 MG TABS tablet Place 1 tablet (5 mg total) into feeding tube 2 (two) times daily. 60 tablet  05/20/2018 at Unknown time  . arformoterol (BROVANA) 15 MCG/2ML NEBU Take 2 mLs (15 mcg total) by nebulization 2 (two) times daily. 120 mL  05/20/2018 at Unknown time  . budesonide (PULMICORT) 0.5 MG/2ML nebulizer solution Take 2 mLs (0.5 mg total) by nebulization 2 (two) times daily.  12 05/20/2018 at Unknown time  . docusate sodium (COLACE) 100 MG capsule Take 100 mg by mouth daily. Via PEG   05/20/2018 at Unknown time  . DULoxetine (CYMBALTA) 30 MG capsule Take 60 mg by mouth See admin instructions. Give 2 capsules by mouth one time a day for nerve pain.   05/20/2018 at Unknown time  . furosemide (LASIX) 40 MG tablet 1 per tube daily 30 tablet  05/20/2018 at Unknown time  . gabapentin (NEURONTIN) 250 MG/5ML solution Take 400 mg by mouth 3 (three) times daily. Give 8 mL via G-Tube every 8 hours for neuropathy   05/03/2018 at Unknown time  . hydrocortisone cream 1 % Apply 1 application topically See admin instructions. Apply to abdomen topically one time a day for Dermatitis rash of abdomen until 05/26/2018   05/10/2018 at Unknown time  . ipratropium-albuterol (DUONEB) 0.5-2.5 (3) MG/3ML SOLN Take 3 mLs by nebulization 3 (three) times daily. 360 mL  05/20/2018 at Unknown time  . levothyroxine (SYNTHROID, LEVOTHROID) 200 MCG tablet Take 200 mcg by mouth daily before breakfast. Give 1 tablet via PEG-Tube one time a day for low thyroid hormone   05/20/2018 at Unknown time  . levothyroxine (SYNTHROID, LEVOTHROID) 25 MCG tablet Take 25 mcg by mouth daily before breakfast. Give 1 tablet via G-Tube one time  a day for hypothyroid   05/20/2018 at Unknown time  . metoprolol  tartrate (LOPRESSOR) 25 MG tablet Place 0.5 tablets (12.5 mg total) into feeding tube 2 (two) times daily.   05/20/2018 at Unknown time  . mirtazapine (REMERON) 15 MG tablet Place 1 tablet (15 mg total) into feeding tube at bedtime.   05/20/2018 at Unknown time  . Multiple Vitamin (MULTIVITAMIN) LIQD Place 15 mLs into feeding tube daily.   unknown  . nitroGLYCERIN (NITROSTAT) 0.4 MG SL tablet Place 1 tablet (0.4 mg total) under the tongue every 5 (five) minutes as needed for chest pain (contact MD if use needed).  12 not in past 30 days  . nutrition supplement, JUVEN, (JUVEN) PACK Place 1 packet into feeding tube 2 (two) times daily between meals.  0 05/07/2018 at Unknown time  . Nutritional Supplements (FEEDING SUPPLEMENT, JEVITY 1.2 CAL,) LIQD Place 1,000 mLs into feeding tube continuous.  0 05/03/2018 at Unknown time  . pantoprazole sodium (PROTONIX) 40 mg/20 mL PACK Place 20 mLs (40 mg total) into feeding tube daily. 30 each  05/20/2018 at Unknown time  . polyethylene glycol (MIRALAX / GLYCOLAX) packet Take 17 g by mouth See admin instructions. Give 17 grams via PEG-Tube one time a day for constipation (in Liquid).   05/20/2018 at Unknown time  . potassium chloride 20 MEQ/15ML (10%) SOLN Place 15 mLs (20 mEq total) into feeding tube 2 (two) times daily. 450 mL 0 05/20/2018 at Unknown time  . simvastatin (ZOCOR) 20 MG tablet Place 1 tablet (20 mg total) into feeding tube at bedtime. 30 tablet  05/20/2018 at Unknown time  . traMADol (ULTRAM) 50 MG tablet Take 50 mg by mouth every 6 (six) hours as needed.   05/20/2018 at Unknown time  . Water For Irrigation, Sterile (FREE WATER) SOLN Place 200 mLs into feeding tube 4 (four) times daily.   05/20/2018 at Unknown time   Scheduled:  Continuous:  [id number], albuterol, antiseptic oral rinse, glycopyrrolate **OR** glycopyrrolate **OR** glycopyrrolate, ipratropium-albuterol, LORazepam **OR** LORazepam **OR** LORazepam, morphine injection, ondansetron **OR**  ondansetron (ZOFRAN) IV, polyvinyl alcohol  Assesment: He has hemorrhagic stroke from metastatic thyroid cancer.  He is on comfort care. Active Problems:   Hemorrhagic stroke ([place])    Plan: Continue morphine as needed.  He is unlikely to survive much longer    LOS: 1 day   HAWKINS,EDWARD L 05/22/2018, 8:31 AM

## 2018-05-22 NOTE — Care Management Important Message (Signed)
Important Message  Patient Details  Name: ELMAR ANTIGUA MRN: [id number] Date of Birth: 02-04-1936   Medicare Important Message Given:  Yes    Shelda Altes 05/22/2018, 12:30 PM

## 2018-05-23 NOTE — Progress Notes (Signed)
Patient lying in bed, eyes closed, appears comfortable at this time. Low-grade fever noted, cool washcloth applied to forehead. Turned, repositioned and mouth care provided. Family at bedside, will continue to monitor.

## 2018-05-23 NOTE — Progress Notes (Signed)
Subjective: He is lying quietly.  Family is at bedside.  They state that he has not open his eyes now in greater than 24 hours.  Objective: Vital signs in last 24 hours: Temp:  [99.9 F (37.7 C)-101.2 F (38.4 C)] 100.7 F (38.2 C) (06/25 0320) Pulse Rate:  [92] 92 (06/24 2203) BP: (139)/(50) 139/50 (06/24 2203) SpO2:  [97 %] 97 % (06/24 2203) Weight change:     Intake/Output from previous day: No intake/output data recorded.  PHYSICAL EXAM General appearance: Unresponsive Resp: rhonchi bilaterally Cardio: regular rate and rhythm, S1, S2 normal, no murmur, click, rub or gallop GI: soft, non-tender; bowel sounds normal; no masses,  no organomegaly Extremities: extremities normal, atraumatic, no cyanosis or edema  Lab Results:  Results for orders placed or performed during the hospital encounter of 05/15/2018 (from the past 48 hour(s))  POC occult blood, ED     Status: Abnormal   Collection Time: 05/05/2018  9:31 AM  Result Value Ref Range   Fecal Occult Bld POSITIVE (A) NEGATIVE    ABGS No results for input(s): PHART, PO2ART, TCO2, HCO3 in the last 72 hours.  Invalid input(s): PCO2 CULTURES No results found for this or any previous visit (from the past 240 hour(s)). Studies/Results: No results found.  Medications:  Prior to Admission:  Medications Prior to Admission  Medication Sig Dispense Refill Last Dose  . acetaminophen (TYLENOL) 325 MG tablet Place 2 tablets (650 mg total) into feeding tube every 4 (four) hours as needed for headache or mild pain.   Past Month at Unknown time  . albuterol (PROVENTIL) (2.5 MG/3ML) 0.083% nebulizer solution Take 2.5 mg by nebulization every 6 (six) hours as needed for wheezing or shortness of breath. 56ml inhale orally every 6 hours as needed for wheezing and shortness of breath   unknown  . apixaban (ELIQUIS) 5 MG TABS tablet Place 1 tablet (5 mg total) into feeding tube 2 (two) times daily. 60 tablet  05/20/2018 at Unknown time  .  arformoterol (BROVANA) 15 MCG/2ML NEBU Take 2 mLs (15 mcg total) by nebulization 2 (two) times daily. 120 mL  05/20/2018 at Unknown time  . budesonide (PULMICORT) 0.5 MG/2ML nebulizer solution Take 2 mLs (0.5 mg total) by nebulization 2 (two) times daily.  12 05/20/2018 at Unknown time  . docusate sodium (COLACE) 100 MG capsule Take 100 mg by mouth daily. Via PEG   05/20/2018 at Unknown time  . DULoxetine (CYMBALTA) 30 MG capsule Take 60 mg by mouth See admin instructions. Give 2 capsules by mouth one time a day for nerve pain.   05/20/2018 at Unknown time  . furosemide (LASIX) 40 MG tablet 1 per tube daily 30 tablet  05/20/2018 at Unknown time  . gabapentin (NEURONTIN) 250 MG/5ML solution Take 400 mg by mouth 3 (three) times daily. Give 8 mL via G-Tube every 8 hours for neuropathy   05/24/2018 at Unknown time  . hydrocortisone cream 1 % Apply 1 application topically See admin instructions. Apply to abdomen topically one time a day for Dermatitis rash of abdomen until 05/26/2018   05/06/2018 at Unknown time  . ipratropium-albuterol (DUONEB) 0.5-2.5 (3) MG/3ML SOLN Take 3 mLs by nebulization 3 (three) times daily. 360 mL  05/20/2018 at Unknown time  . levothyroxine (SYNTHROID, LEVOTHROID) 200 MCG tablet Take 200 mcg by mouth daily before breakfast. Give 1 tablet via PEG-Tube one time a day for low thyroid hormone   05/20/2018 at Unknown time  . levothyroxine (SYNTHROID, LEVOTHROID) 25 MCG tablet  Take 25 mcg by mouth daily before breakfast. Give 1 tablet via G-Tube one time a day for hypothyroid   05/20/2018 at Unknown time  . metoprolol tartrate (LOPRESSOR) 25 MG tablet Place 0.5 tablets (12.5 mg total) into feeding tube 2 (two) times daily.   05/20/2018 at Unknown time  . mirtazapine (REMERON) 15 MG tablet Place 1 tablet (15 mg total) into feeding tube at bedtime.   05/20/2018 at Unknown time  . Multiple Vitamin (MULTIVITAMIN) LIQD Place 15 mLs into feeding tube daily.   unknown  . nitroGLYCERIN (NITROSTAT) 0.4 MG  SL tablet Place 1 tablet (0.4 mg total) under the tongue every 5 (five) minutes as needed for chest pain (contact MD if use needed).  12 not in past 30 days  . nutrition supplement, JUVEN, (JUVEN) PACK Place 1 packet into feeding tube 2 (two) times daily between meals.  0 05/05/2018 at Unknown time  . Nutritional Supplements (FEEDING SUPPLEMENT, JEVITY 1.2 CAL,) LIQD Place 1,000 mLs into feeding tube continuous.  0 05/22/2018 at Unknown time  . pantoprazole sodium (PROTONIX) 40 mg/20 mL PACK Place 20 mLs (40 mg total) into feeding tube daily. 30 each  05/20/2018 at Unknown time  . polyethylene glycol (MIRALAX / GLYCOLAX) packet Take 17 g by mouth See admin instructions. Give 17 grams via PEG-Tube one time a day for constipation (in Liquid).   05/20/2018 at Unknown time  . potassium chloride 20 MEQ/15ML (10%) SOLN Place 15 mLs (20 mEq total) into feeding tube 2 (two) times daily. 450 mL 0 05/20/2018 at Unknown time  . simvastatin (ZOCOR) 20 MG tablet Place 1 tablet (20 mg total) into feeding tube at bedtime. 30 tablet  05/20/2018 at Unknown time  . traMADol (ULTRAM) 50 MG tablet Take 50 mg by mouth every 6 (six) hours as needed.   05/20/2018 at Unknown time  . Water For Irrigation, Sterile (FREE WATER) SOLN Place 200 mLs into feeding tube 4 (four) times daily.   05/20/2018 at Unknown time   Scheduled:  Continuous:  [id number], albuterol, antiseptic oral rinse, glycopyrrolate **OR** glycopyrrolate **OR** glycopyrrolate, ipratropium-albuterol, LORazepam **OR** LORazepam **OR** LORazepam, morphine injection, ondansetron **OR** ondansetron (ZOFRAN) IV, polyvinyl alcohol  Assesment: He was admitted with a hemorrhagic stroke related to metastatic papillary thyroid carcinoma.  He has widespread metastatic disease.  He is unresponsive  At baseline he has COPD and that seems pretty stable.  He has acute on chronic hypoxic respiratory failure and his oxygenation is okay  He has severe protein calorie  malnutrition  He has unstageable decubiti on his heels Active Problems:   Hx of CABG   COPD (chronic obstructive pulmonary disease) (HCC)   PAF (paroxysmal atrial fibrillation) (HCC)   Protein-calorie malnutrition, severe   Acute on chronic respiratory failure with hypoxia (HCC)   Pressure injury of skin   Papillary thyroid carcinoma (HCC)   Hemorrhagic stroke ([place])    Plan: Continue treatments expect death in the next 24 hours    LOS: 2 days   Niles Ess L 05/23/2018, 8:44 AM

## 2018-05-24 NOTE — Care Management Important Message (Signed)
Important Message  Patient Details  Name: DEONE LEIFHEIT MRN: [id number] Date of Birth: 1936-01-20   Medicare Important Message Given:  Yes    Shelda Altes 05/24/2018, 11:26 AM

## 2018-05-24 NOTE — Progress Notes (Signed)
Subjective: He looks substantially worse this morning.  Family said that previously he would squeeze their hand although it was weak and he is not doing that anymore.  He seems more congested.  He is had fever.  Objective: Vital signs in last 24 hours: Temp:  [99.5 F (37.5 C)-102.2 F (39 C)] 102.2 F (39 C) (06/26 0622) Pulse Rate:  [96-111] 111 (06/26 0622) BP: (96-150)/(49-64) 149/58 (06/26 0622) SpO2:  [96 %-97 %] 96 % (06/26 0622) Weight change:  Last BM Date: (unknown)  Intake/Output from previous day: No intake/output data recorded.  PHYSICAL EXAM General appearance: Unresponsive Resp: Significant bilateral rhonchi Cardio: regular rate and rhythm, S1, S2 normal, no murmur, click, rub or gallop GI: soft, non-tender; bowel sounds normal; no masses,  no organomegaly Extremities: extremities normal, atraumatic, no cyanosis or edema  Lab Results:  No results found for this or any previous visit (from the past 48 hour(s)).  ABGS No results for input(s): PHART, PO2ART, TCO2, HCO3 in the last 72 hours.  Invalid input(s): PCO2 CULTURES No results found for this or any previous visit (from the past 240 hour(s)). Studies/Results: No results found.  Medications:  Prior to Admission:  Medications Prior to Admission  Medication Sig Dispense Refill Last Dose  . acetaminophen (TYLENOL) 325 MG tablet Place 2 tablets (650 mg total) into feeding tube every 4 (four) hours as needed for headache or mild pain.   Past Month at Unknown time  . albuterol (PROVENTIL) (2.5 MG/3ML) 0.083% nebulizer solution Take 2.5 mg by nebulization every 6 (six) hours as needed for wheezing or shortness of breath. 60ml inhale orally every 6 hours as needed for wheezing and shortness of breath   unknown  . apixaban (ELIQUIS) 5 MG TABS tablet Place 1 tablet (5 mg total) into feeding tube 2 (two) times daily. 60 tablet  05/20/2018 at Unknown time  . arformoterol (BROVANA) 15 MCG/2ML NEBU Take 2 mLs (15 mcg  total) by nebulization 2 (two) times daily. 120 mL  05/20/2018 at Unknown time  . budesonide (PULMICORT) 0.5 MG/2ML nebulizer solution Take 2 mLs (0.5 mg total) by nebulization 2 (two) times daily.  12 05/20/2018 at Unknown time  . docusate sodium (COLACE) 100 MG capsule Take 100 mg by mouth daily. Via PEG   05/20/2018 at Unknown time  . DULoxetine (CYMBALTA) 30 MG capsule Take 60 mg by mouth See admin instructions. Give 2 capsules by mouth one time a day for nerve pain.   05/20/2018 at Unknown time  . furosemide (LASIX) 40 MG tablet 1 per tube daily 30 tablet  05/20/2018 at Unknown time  . gabapentin (NEURONTIN) 250 MG/5ML solution Take 400 mg by mouth 3 (three) times daily. Give 8 mL via G-Tube every 8 hours for neuropathy   05/06/2018 at Unknown time  . hydrocortisone cream 1 % Apply 1 application topically See admin instructions. Apply to abdomen topically one time a day for Dermatitis rash of abdomen until 05/26/2018   05/20/2018 at Unknown time  . ipratropium-albuterol (DUONEB) 0.5-2.5 (3) MG/3ML SOLN Take 3 mLs by nebulization 3 (three) times daily. 360 mL  05/20/2018 at Unknown time  . levothyroxine (SYNTHROID, LEVOTHROID) 200 MCG tablet Take 200 mcg by mouth daily before breakfast. Give 1 tablet via PEG-Tube one time a day for low thyroid hormone   05/20/2018 at Unknown time  . levothyroxine (SYNTHROID, LEVOTHROID) 25 MCG tablet Take 25 mcg by mouth daily before breakfast. Give 1 tablet via G-Tube one time a day for hypothyroid  05/20/2018 at Unknown time  . metoprolol tartrate (LOPRESSOR) 25 MG tablet Place 0.5 tablets (12.5 mg total) into feeding tube 2 (two) times daily.   05/20/2018 at Unknown time  . mirtazapine (REMERON) 15 MG tablet Place 1 tablet (15 mg total) into feeding tube at bedtime.   05/20/2018 at Unknown time  . Multiple Vitamin (MULTIVITAMIN) LIQD Place 15 mLs into feeding tube daily.   unknown  . nitroGLYCERIN (NITROSTAT) 0.4 MG SL tablet Place 1 tablet (0.4 mg total) under the tongue  every 5 (five) minutes as needed for chest pain (contact MD if use needed).  12 not in past 30 days  . nutrition supplement, JUVEN, (JUVEN) PACK Place 1 packet into feeding tube 2 (two) times daily between meals.  0 05/23/2018 at Unknown time  . Nutritional Supplements (FEEDING SUPPLEMENT, JEVITY 1.2 CAL,) LIQD Place 1,000 mLs into feeding tube continuous.  0 05/10/2018 at Unknown time  . pantoprazole sodium (PROTONIX) 40 mg/20 mL PACK Place 20 mLs (40 mg total) into feeding tube daily. 30 each  05/20/2018 at Unknown time  . polyethylene glycol (MIRALAX / GLYCOLAX) packet Take 17 g by mouth See admin instructions. Give 17 grams via PEG-Tube one time a day for constipation (in Liquid).   05/20/2018 at Unknown time  . potassium chloride 20 MEQ/15ML (10%) SOLN Place 15 mLs (20 mEq total) into feeding tube 2 (two) times daily. 450 mL 0 05/20/2018 at Unknown time  . simvastatin (ZOCOR) 20 MG tablet Place 1 tablet (20 mg total) into feeding tube at bedtime. 30 tablet  05/20/2018 at Unknown time  . traMADol (ULTRAM) 50 MG tablet Take 50 mg by mouth every 6 (six) hours as needed.   05/20/2018 at Unknown time  . Water For Irrigation, Sterile (FREE WATER) SOLN Place 200 mLs into feeding tube 4 (four) times daily.   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  He is on comfort care.  He looks like he may die today. Active Problems:   Hx of CABG   COPD (chronic obstructive pulmonary disease) (HCC)   PAF (paroxysmal atrial fibrillation) (HCC)   Protein-calorie malnutrition, severe   Acute on chronic respiratory failure with hypoxia (HCC)   Pressure  injury of skin   Papillary thyroid carcinoma ([place])   Hemorrhagic stroke ([place])    Plan: Continue comfort care    LOS: 3 days   Sabrie Moritz L 05/24/2018, 9:01 AM

## 2018-05-25 MED ORDER — SCOPOLAMINE 1 MG/3DAYS TD PT72
1.0000 | MEDICATED_PATCH | TRANSDERMAL | Status: DC
  Administered 2018-05-25: 1.5 mg via TRANSDERMAL
  Filled 2018-05-25: qty 1

## 2018-05-29 NOTE — Progress Notes (Signed)
Subjective: He has more trouble with secretions this morning.  He is unresponsive.  Objective: Vital signs in last 24 hours: Temp:  [99.3 F (37.4 C)-101.6 F (38.7 C)] 100 F (37.8 C) (06/27 0757) Pulse Rate:  [112-119] 119 (06/27 0559) Resp:  [20] 20 (06/27 0559) BP: (131-136)/(56-67) 131/56 (06/27 0559) SpO2:  [90 %-99 %] 99 % (06/27 0559) Weight change:  Last BM Date: (unknown)  Intake/Output from previous day: No intake/output data recorded.  PHYSICAL EXAM General appearance: Unresponsive, gurgling Resp: Gurgling respirations Cardio: regular rate and rhythm, S1, S2 normal, no murmur, click, rub or gallop GI: soft, non-tender; bowel sounds normal; no masses,  no organomegaly Extremities: extremities normal, atraumatic, no cyanosis or edema  Lab Results:  No results found for this or any previous visit (from the past 48 hour(s)).  ABGS No results for input(s): PHART, PO2ART, TCO2, HCO3 in the last 72 hours.  Invalid input(s): PCO2 CULTURES No results found for this or any previous visit (from the past 240 hour(s)). Studies/Results: No results found.  Medications:  Prior to Admission:  Medications Prior to Admission  Medication Sig Dispense Refill Last Dose  . acetaminophen (TYLENOL) 325 MG tablet Place 2 tablets (650 mg total) into feeding tube every 4 (four) hours as needed for headache or mild pain.   Past Month at Unknown time  . albuterol (PROVENTIL) (2.5 MG/3ML) 0.083% nebulizer solution Take 2.5 mg by nebulization every 6 (six) hours as needed for wheezing or shortness of breath. 46ml inhale orally every 6 hours as needed for wheezing and shortness of breath   unknown  . apixaban (ELIQUIS) 5 MG TABS tablet Place 1 tablet (5 mg total) into feeding tube 2 (two) times daily. 60 tablet  05/20/2018 at Unknown time  . arformoterol (BROVANA) 15 MCG/2ML NEBU Take 2 mLs (15 mcg total) by nebulization 2 (two) times daily. 120 mL  05/20/2018 at Unknown time  . budesonide  (PULMICORT) 0.5 MG/2ML nebulizer solution Take 2 mLs (0.5 mg total) by nebulization 2 (two) times daily.  12 05/20/2018 at Unknown time  . docusate sodium (COLACE) 100 MG capsule Take 100 mg by mouth daily. Via PEG   05/20/2018 at Unknown time  . DULoxetine (CYMBALTA) 30 MG capsule Take 60 mg by mouth See admin instructions. Give 2 capsules by mouth one time a day for nerve pain.   05/20/2018 at Unknown time  . furosemide (LASIX) 40 MG tablet 1 per tube daily 30 tablet  05/20/2018 at Unknown time  . gabapentin (NEURONTIN) 250 MG/5ML solution Take 400 mg by mouth 3 (three) times daily. Give 8 mL via G-Tube every 8 hours for neuropathy   05/27/2018 at Unknown time  . hydrocortisone cream 1 % Apply 1 application topically See admin instructions. Apply to abdomen topically one time a day for Dermatitis rash of abdomen until 05/26/2018   05/11/2018 at Unknown time  . ipratropium-albuterol (DUONEB) 0.5-2.5 (3) MG/3ML SOLN Take 3 mLs by nebulization 3 (three) times daily. 360 mL  05/20/2018 at Unknown time  . levothyroxine (SYNTHROID, LEVOTHROID) 200 MCG tablet Take 200 mcg by mouth daily before breakfast. Give 1 tablet via PEG-Tube one time a day for low thyroid hormone   05/20/2018 at Unknown time  . levothyroxine (SYNTHROID, LEVOTHROID) 25 MCG tablet Take 25 mcg by mouth daily before breakfast. Give 1 tablet via G-Tube one time a day for hypothyroid   05/20/2018 at Unknown time  . metoprolol tartrate (LOPRESSOR) 25 MG tablet Place 0.5 tablets (12.5 mg total) into  feeding tube 2 (two) times daily.   05/20/2018 at Unknown time  . mirtazapine (REMERON) 15 MG tablet Place 1 tablet (15 mg total) into feeding tube at bedtime.   05/20/2018 at Unknown time  . Multiple Vitamin (MULTIVITAMIN) LIQD Place 15 mLs into feeding tube daily.   unknown  . nitroGLYCERIN (NITROSTAT) 0.4 MG SL tablet Place 1 tablet (0.4 mg total) under the tongue every 5 (five) minutes as needed for chest pain (contact MD if use needed).  12 not in past 30  days  . nutrition supplement, JUVEN, (JUVEN) PACK Place 1 packet into feeding tube 2 (two) times daily between meals.  0 05/17/2018 at Unknown time  . Nutritional Supplements (FEEDING SUPPLEMENT, JEVITY 1.2 CAL,) LIQD Place 1,000 mLs into feeding tube continuous.  0 05/03/2018 at Unknown time  . pantoprazole sodium (PROTONIX) 40 mg/20 mL PACK Place 20 mLs (40 mg total) into feeding tube daily. 30 each  05/20/2018 at Unknown time  . polyethylene glycol (MIRALAX / GLYCOLAX) packet Take 17 g by mouth See admin instructions. Give 17 grams via PEG-Tube one time a day for constipation (in Liquid).   05/20/2018 at Unknown time  . potassium chloride 20 MEQ/15ML (10%) SOLN Place 15 mLs (20 mEq total) into feeding tube 2 (two) times daily. 450 mL 0 05/20/2018 at Unknown time  . simvastatin (ZOCOR) 20 MG tablet Place 1 tablet (20 mg total) into feeding tube at bedtime. 30 tablet  05/20/2018 at Unknown time  . traMADol (ULTRAM) 50 MG tablet Take 50 mg by mouth every 6 (six) hours as needed.   05/20/2018 at Unknown time  . Water For Irrigation, Sterile (FREE WATER) SOLN Place 200 mLs into feeding tube 4 (four) times daily.   05/20/2018 at Unknown time   Scheduled: . scopolamine  1 patch Transdermal Q72H   Continuous:  [id number], albuterol, antiseptic oral rinse, glycopyrrolate **OR** glycopyrrolate **OR** glycopyrrolate, ipratropium-albuterol, LORazepam **OR** LORazepam **OR** LORazepam, morphine injection, ondansetron **OR** ondansetron (ZOFRAN) IV, polyvinyl alcohol  Assesment: He was admitted with hemorrhagic stroke related to metastatic papillary thyroid carcinoma to his brain.  He is terminal.  We will plan to transfer to [facility]ice.  Add scopolamine patch Active Problems:   Hx of CABG   COPD (chronic obstructive pulmonary disease) (HCC)   PAF (paroxysmal atrial fibrillation) (HCC)   Protein-calorie malnutrition, severe   Acute on chronic respiratory failure with hypoxia (HCC)   Pressure injury of  skin   Papillary thyroid carcinoma ([place])   Hemorrhagic stroke ([place])    Plan: As above    LOS: 4 days   Carvell Hoeffner L 06/16/18, 8:45 AM

## 2018-05-29 NOTE — Discharge Summary (Signed)
Final discharge diagnoses: Hemorrhagic stroke related to metastatic thyroid cancer Metastatic thyroid cancer Paroxysmal atrial fib Severe protein calorie malnutrition Heel decubitus  Discharge condition: Unchanged  Hospital course: This is a 82 year old who came from a skilled care facility after being less responsive.  He was seen in the emergency department and had CT that showed a hemorrhagic stroke along with metastatic disease from his known thyroid cancer.  This was felt not to be a survivable lesion.  He did have tele-neurology consultation.  He was admitted to the hospital for comfort care.  He was transferred to [facility] on 06/06/2018.  Discharge exam: He is unresponsive.  He has gurgling respirations

## 2018-05-29 NOTE — Progress Notes (Signed)
Patient is resting in bed. Patient has increased secretions and patch is applied per MD order. Daughter is at bedside. Ice packs are placed by patients side as family requests. Turned and repositioned. Will continue to monitor and assess.

## 2018-05-29 NOTE — Care Management (Signed)
Patient Information  SS# [id number]  Patient Name Alano, Blasco ([id number]) Sex Male DOB 05-08-1936  Room Bed  A341 A341-01  Patient Demographics   Address [address] [place] [address] Phone [phone] (Home) [phone] (Mobile) *Preferred*  Patient Ethnicity & Race   Ethnic Group Patient Race  Not Hispanic or Latino White or Caucasian  Emergency Contact(s)   Name Relation Home Work Mobile  Beatty Daughter   [phone]  Oleh Genin Relative   [phone]  [facility] Relative   [phone]  Charlynn Court   [phone]  Rayven, Hendrickson   [phone]  Documents on File    Status Date Received Description  Documents for the Patient  [place] OF PRIVACY - Scanned Received 02/07/13   [place] E-Signature HIPAA Notice of Privacy Signed 08/23/16   [place] E-Signature HIPAA Notice of Privacy [place] Not Received    Driver's License Received 60/10/93   Insurance Card Received 09/28/11   Advance Directives/Living Will/HCPOA/POA Not Received    Financial Application Not Received    Insurance Card Received 02/19/13   [place] HIPAA NOTICE OF PRIVACY - Scanned Not Received    Insurance Card Received 08/03/12 RCGD  Release of Information Received 08/03/12 RCGD  [place] HIPAA NOTICE OF PRIVACY - Scanned Received 08/03/12 RCGD  HIM ROI Authorization Not Received  RCGD--08/03/12 note to PCP  AMB Correspondence Not Received  07/13 card [facility]  HIM ROI Authorization Not Received  RCGD--08/18/12 TCS/EGD op note, pathology & labs to PCP  HIM ROI Authorization Not Received  RCGD--08/18/12 TCS/EGD op note, pathology & labs to Dr Rollene Fare  Driver's License Not Received    HIM ROI Authorization Not Received  RCGD--01/23/13 prog note to PCP  AMB Correspondence Not Received  02/14 Card [place] Card Received 06/07/14 Medicare  AMB Correspondence  07/20/14 03/14-09/15OFFICENOTE [facility]  AMB Correspondence   06/07/14 FAX COVER [place] Card Received 01/24/15 medicare  AMB Correspondence  02/24/15 CLINICAL NOTE HAWKINS MD, E  Other Photo ID Not Received    AMB Correspondence  07/13/15 3/14-9/14 OFFICE NOTES [facility]  VVS Policy for Pain - E [employer] Card   Medicare, 12/19/15 mar  HIM ROI Authorization  12/22/15   Insurance Card Received 12/25/15   AMB Correspondence  12/30/15 REFERRAL HAWKINS MD, E  AMB Correspondence  07/01/16 Livia Snellen Beatrix Fetters PA-C L  Insurance Card Received 05/12/17 MEDICARE/RCGD/HHS  Release of Information Received 05/12/17 DPR/NS & CX FORM/RCGD/HHS  HIM ROI Authorization  05/12/17 RCGD--05/12/17 prog note to PCP Luan Pulling)  Release of Information   DPR/RCGD/HHS  Insurance Card Received 09/12/17 NEW MEDICARE CARD  HIM ROI Authorization  09/12/17 RCGD--09/12/17 TCS/EGD op note to PCP Luan Pulling)  HIM ROI Authorization  09/13/17 RCGD--09/12/17 labs to PCP Luan Pulling)  HIM ROI Authorization  09/21/17 RCGD--09/15/17 labs to PCP Luan Pulling)  HIM ROI Authorization  09/26/17 RCGD--09/23/17 labs to PCP Luan Pulling)  AMB Provider Completed Forms Received 11/16/17 PATIENT ASSISTANCE [place] Card Received 12/06/17 Medicare  Insurance Card Received 12/06/17 MEDICARE/RCGD/HHS  [place] E-Signature HIPAA Notice of Privacy Signed 02/10/18   Consent Form     HIM ROI Authorization  03/03/18 [facility] [place]  AMB Correspondence Received 03/10/18 LETTER NEWMAN MD,C  AMB Correspondence Received 01/04/18 LETTER NEWMAN MD,C  HIM ROI Authorization  03/28/18 [facility] AT [place]  AMB Correspondence (Deleted) 03/20/15 CLINICAL NOTE HAWKINS MD, [place] (Deleted) 09/26/17 RCGD--09/23/17 labs to  PCP (hawkins)  HIM Release of Information Output (Deleted) 03/03/18 Requested records  HIM Release of Information Output (Deleted) 03/28/18 Requested records  Documents for the Encounter   AOB (Assignment of Insurance Benefits) Not Received    E-signature AOB Signed 05/16/2018   MEDICARE RIGHTS Not Received    E-signature Medicare Rights Signed 05/02/2018   Cardiac Monitoring Strip Shift Summary Received 05/24/2018   ED Patient Billing Extract   ED PB Billing Extract  Admission Information   Attending Provider Admitting Provider Admission Type Admission Date/Time  Sinda Du, MD Florencia Reasons, MD Emergency 05/15/2018 [id number]  Discharge Date Hospital Service Auth/Cert Status Service Area   Internal Medicine Incomplete [facility]  Unit Room/Bed Admission Status   AP-DEPT 300 A341/A341-01 Admission (Confirmed)   Admission   Complaint  [facility] [facility] Account   Name Acct ID Class Status Primary Coverage  Mattheus, Rauls [id number] Inpatient Open MEDICARE - MEDICARE PART A AND B      Guarantor Account (for Hospital Account [id number])   Name Relation to Pt Service Area Active? Acct Type  Milton Ferguson Self CHSA Yes Personal/Family  Address Phone    [address] [place], [place] [address] [phone])        Coverage Information (for Hospital Account [id number])   F/O Payor/Plan Precert #  MEDICARE/MEDICARE PART A AND B   Subscriber Subscriber #  Garan, Frappier [id number]  Address Phone  [address] [place], [place] [address]

## 2018-05-29 NOTE — Care Management (Signed)
Per MD and family choice referral faxed to [place], anticipate transition to GIP status.

## 2018-05-29 NOTE — H&P (Signed)
John Sampson MRN: [id number] DOB/AGE: [birth year] 82 y.o. Primary Care Physician:Johaan Ryser, Percell Miller, MD Admit date: 05/14/2018 Chief Complaint: Terminal care HPI: This is an 82 year old who came from the skilled care facility with altered mental status.  He was found to have hemorrhagic stroke related to metastatic papillary thyroid cancer.  This was not felt to be a survivable stroke.  He had tele-neurology consultation and he was admitted to the hospital for comfort care.  He is transferred to GIP/hospice today  Past Medical History:  Diagnosis Date  . Atrial flutter ([place])    on Eliquis  . AV malformation of GI tract   . CHF (congestive heart failure) ([place])   . COPD (chronic obstructive pulmonary disease) ([place]) 3.12.14   2D Echo, EF 50-55%  . Coronary atherosclerosis of native coronary artery    Multivessel status post CABG  . Dysphagia   . Gastric ulcer    Small - nonbleeding  . GERD (gastroesophageal reflux disease)   . Headache(784.0)   . Hypothyroidism   . Iron deficiency anemia    Negative Givens capsule study   . Myocardial infarction ([place])   . PAD (peripheral artery disease) (HCC)    Moderate bilateral SFA disease at angiography 01/2013  . Paralysis of vocal cords   . Pituitary macroadenoma ([place])   . Thyroid cancer ([place])   . Vocal cord paralysis    left   Past Surgical History:  Procedure Laterality Date  . ABDOMINAL AORTAGRAM N/A 02/19/2013   Procedure: ABDOMINAL Maxcine Ham;  Surgeon: [name], MD;  Location: [facility] CATH LAB;  Service: Cardiovascular;  Laterality: N/A;  . CARDIAC CATHETERIZATION    . CARDIOVERSION N/A 06/16/2015   Procedure: CARDIOVERSION;  Surgeon: Satira Sark, MD;  Location: [facility] ORS;  Service: Cardiovascular;  Laterality: N/A;  . COLONOSCOPY  08/18/2012   Procedure: COLONOSCOPY;  Surgeon: [name], MD;  Location[facility] ENDO SU[facility];  Service: Endoscopy;  Laterality: N/A;  1:25  . COLONOSCOPY N/A 09/12/2017   Procedure: COLONOSCOPY;  Surgeon:  [name], MD;  Location[facility] ENDO SU[facility];  Service: Endoscopy;  Laterality: N/A;  . CORONARY ARTERY BYPASS GRAFT  2003   5 grafts - details not clear  . CRANIOTOMY  12/31/2011   Procedure: CRANIOTOMY HYPOPHYSECTOMY TRANSNASAL APPROACH;  Surgeon: [name], MD;  Location: [place] NEURO ORS;  Service: Neurosurgery;  Laterality: N/A;  Transphenoidal Hypophysectomy With Fat Graft Harvest from right abdomen   . ESOPHAGOGASTRODUODENOSCOPY  08/18/2012   Procedure: ESOPHAGOGASTRODUODENOSCOPY (EGD);  Surgeon: [name], MD;  [facility];  Service: Endoscopy;  Laterality: N/A;  . ESOPHAGOGASTRODUODENOSCOPY N/A 03/31/2017   Procedure: ESOPHAGOGASTRODUODENOSCOPY (EGD);  Surgeon: [name], MD;  [facility];  Service: Endoscopy;  Laterality: N/A;  . ESOPHAGOGASTRODUODENOSCOPY N/A 09/12/2017   Procedure: ESOPHAGOGASTRODUODENOSCOPY (EGD);  Surgeon: [name], MD;  [facility];  Service: Endoscopy;  Laterality: N/A;  10:40  . EYE SURGERY  2012  . GIVENS CAPSULE STUDY N/A 01/25/2013   Procedure: GIVENS CAPSULE STUDY;  Surgeon: [name], MD;  [facility];  Service: Endoscopy;  Laterality: N/A;  730  . HOT HEMOSTASIS  03/31/2017   Procedure: HOT HEMOSTASIS (ARGON PLASMA COAGULATION/BICAP);  Surgeon: [name], MD;  [facility];  Service: Endoscopy;;  duodenum  . IR GASTROSTOMY TUBE MOD SED  02/24/2018  . LARYNGOPLASTY Left 09/14/2016   Procedure: LARYNGOPLASTY;  Surgeon: [name], MD;  Location: [place];  Service: ENT;  Laterality: Left;  . LOWER EXTREMITY ANGIOGRAM N/A 02/19/2013   Procedure: LOWER EXTREMITY ANGIOGRAM;  Surgeon: [name], MD;  Location: [facility] CATH LAB;  Service: Cardiovascular;  Laterality: N/A;  . MICROLARYNGOSCOPY N/A 02/10/2018   Procedure: MICROLARYNGOSCOPY WITH VOCAL CORD INJECTION;  Surgeon: [name], MD;  Location: [place];  Service: ENT;  Laterality: N/A;   . PERIPHERAL VASCULAR CATHETERIZATION N/A 01/19/2016   Procedure: Abdominal Aortogram;  Surgeon: Conrad Greenup, MD;  Location: [facility] CV LAB;  Service: Cardiovascular;  Laterality: N/A;  . PERIPHERAL VASCULAR CATHETERIZATION Bilateral 01/19/2016   Procedure: Lower Extremity Angiography;  Surgeon: Conrad Waialua, MD;  Location: [place] CV LAB;  Service: Cardiovascular;  Laterality: Bilateral;  . PV Angiogram  02/19/13   Indications: slow healing left calf ulcer  . Stress Myocardial Perfusion  12/08/2011   Indications: Abnormal EKG, Eval of prior GABG  . TEE WITHOUT CARDIOVERSION N/A 06/16/2015   Procedure: TRANSESOPHAGEAL ECHOCARDIOGRAM (TEE);  Surgeon: Satira Sark, MD;  Location[facility];  Service: Cardiovascular;  Laterality: N/A;  . THYROIDECTOMY N/A 09/14/2016   Procedure: TOTAL THYROIDECTOMY;  Surgeon: [name], MD;  Location: [place];  Service: ENT;  Laterality: N/A;  . TONSILLECTOMY  Age 47        Family History  Problem Relation Age of Onset  . CAD Mother   . Heart disease Father        before age 38    Social History:  reports that he quit smoking about 16 years ago. His smoking use included cigarettes. He started smoking about 63 years ago. He has a 135.00 pack-year smoking history. He has never used smokeless tobacco. He reports that he does not drink alcohol or use drugs.   Allergies: No Known Allergies  Medications Prior to Admission  Medication Sig Dispense Refill  . acetaminophen (TYLENOL) 325 MG tablet Place 2 tablets (650 mg total) into feeding tube every 4 (four) hours as needed for headache or mild pain.    [name] albuterol (PROVENTIL) (2.5 MG/3ML) 0.083% nebulizer solution Take 2.5 mg by nebulization every 6 (six) hours as needed for wheezing or shortness of breath. 45ml inhale orally every 6 hours as needed for wheezing and shortness of breath    . apixaban (ELIQUIS) 5 MG TABS tablet Place 1 tablet (5 mg total) into feeding tube 2 (two) times daily. 60  tablet   . arformoterol (BROVANA) 15 MCG/2ML NEBU Take 2 mLs (15 mcg total) by nebulization 2 (two) times daily. 120 mL   . budesonide (PULMICORT) 0.5 MG/2ML nebulizer solution Take 2 mLs (0.5 mg total) by nebulization 2 (two) times daily.  12  . docusate sodium (COLACE) 100 MG capsule Take 100 mg by mouth daily. Via PEG    . DULoxetine (CYMBALTA) 30 MG capsule Take 60 mg by mouth See admin instructions. Give 2 capsules by mouth one time a day for nerve pain.    . furosemide (LASIX) 40 MG tablet 1 per tube daily 30 tablet   . gabapentin (NEURONTIN) 250 MG/5ML solution Take 400 mg by mouth 3 (three) times daily. Give 8 mL via G-Tube every 8 hours for neuropathy    . hydrocortisone cream 1 % Apply 1 application topically See admin instructions. Apply to abdomen topically one time a day for Dermatitis rash of abdomen until 05/26/2018    . ipratropium-albuterol (DUONEB) 0.5-2.5 (3) MG/3ML SOLN Take 3 mLs by nebulization 3 (three) times daily. 360 mL   . levothyroxine (  SYNTHROID, LEVOTHROID) 200 MCG tablet Take 200 mcg by mouth daily before breakfast. Give 1 tablet via PEG-Tube one time a day for low thyroid hormone    . levothyroxine (SYNTHROID, LEVOTHROID) 25 MCG tablet Take 25 mcg by mouth daily before breakfast. Give 1 tablet via G-Tube one time a day for hypothyroid    . metoprolol tartrate (LOPRESSOR) 25 MG tablet Place 0.5 tablets (12.5 mg total) into feeding tube 2 (two) times daily.    . mirtazapine (REMERON) 15 MG tablet Place 1 tablet (15 mg total) into feeding tube at bedtime.    . Multiple Vitamin (MULTIVITAMIN) LIQD Place 15 mLs into feeding tube daily.    . nitroGLYCERIN (NITROSTAT) 0.4 MG SL tablet Place 1 tablet (0.4 mg total) under the tongue every 5 (five) minutes as needed for chest pain (contact MD if use needed).  12  . nutrition supplement, JUVEN, (JUVEN) PACK Place 1 packet into feeding tube 2 (two) times daily between meals.  0  . Nutritional Supplements (FEEDING SUPPLEMENT, JEVITY  1.2 CAL,) LIQD Place 1,000 mLs into feeding tube continuous.  0  . pantoprazole sodium (PROTONIX) 40 mg/20 mL PACK Place 20 mLs (40 mg total) into feeding tube daily. 30 each   . polyethylene glycol (MIRALAX / GLYCOLAX) packet Take 17 g by mouth See admin instructions. Give 17 grams via PEG-Tube one time a day for constipation (in Liquid).    . potassium chloride 20 MEQ/15ML (10%) SOLN Place 15 mLs (20 mEq total) into feeding tube 2 (two) times daily. 450 mL 0  . simvastatin (ZOCOR) 20 MG tablet Place 1 tablet (20 mg total) into feeding tube at bedtime. 30 tablet   . traMADol (ULTRAM) 50 MG tablet Take 50 mg by mouth every 6 (six) hours as needed.    . Water For Irrigation, Sterile (FREE WATER) SOLN Place 200 mLs into feeding tube 4 (four) times daily.         ACZ:YSAYT from the symptoms mentioned above,there are no other symptoms referable to all systems reviewed.  Physical Exam: Blood pressure (!) 131/56, pulse (!) 119, temperature 100 F (37.8 C), temperature source Axillary, resp. rate 20, weight 83 kg (183 lb), SpO2 99 %. Constitutional: He is unresponsive.  Eyes: Remain closed ears nose mouth and throat: Mildly dry mucous membranes.  Cardiovascular: His heart is regular with normal heart sounds.  Respiratory: He has a gurgling respirations.  Gastrointestinal: His abdomen is soft with no masses.  Skin: Warm and dry with poor skin turgor.  Neurological: Cannot assess for psychiatric cannot assess   No results for input(s): WBC, NEUTROABS, HGB, HCT, MCV, PLT in the last 72 hours. No results for input(s): NA, K, CL, CO2, GLUCOSE, BUN, CREATININE, CALCIUM, MG in the last 72 hours.  Invalid input(s): PHOlablast2(ast:2,ALT:2,alkphos:2,bilitot:2,prot:2,albumin:2)@    No results found for this or any previous visit (from the past 240 hour(s)).   Nm Pet Image Initial (pi) Skull Base To Thigh  Result Date: 05/02/2018 CLINICAL DATA:  Initial treatment strategy for papillary thyroid cancer  with known metastatic disease. EXAM: NUCLEAR MEDICINE PET SKULL BASE TO THIGH TECHNIQUE: 9.16 mCi F-18 FDG was injected intravenously. Full-ring PET imaging was performed from the skull base to thigh after the radiotracer. CT data was obtained and used for attenuation correction and anatomic localization. Fasting blood glucose: 104 mg/dl COMPARISON:  None. FINDINGS: Mediastinal blood pool activity: SUV max 2.9 NECK: Diffuse hypermetabolism throughout the longus coli musculature bilaterally, as well as several other right-sided cervical muscles inferiorly,  presumably physiologic. No hypermetabolic lymph nodes in the neck. Incidental CT findings: none CHEST: Previously demonstrated low-attenuation mass in the posterior aspect of the left lobe of the thyroid gland is diffusely hypermetabolic (SUVmax = 58.5). Nonenlarged left supraclavicular lymph nodes measuring up to 9 mm in short axis are hypermetabolic (SUVmax = 27.7). Nonenlarged superior mediastinal lymph nodes post row lateral to the trachea on the right side measuring up to 8 mm in short axis are hypermetabolic (SUVmax = 82.4) . There are hypermetabolic mediastinal and bilateral hilar lymph nodes (SUVmax = 5.1-9.6) . Multiple small hypermetabolic pulmonary nodules are noted throughout the lungs bilaterally, largest of which is in the anterior aspect of the left upper lobe (axial image 33 of series 8) (SUVmax = 14.6) . Incidental CT findings: Probable air trapping in the lower lobes of the lungs bilaterally. Extensive atherosclerosis throughout the thoracic aorta and the coronary arteries, including calcified atherosclerotic plaque in the left main, left anterior descending, left circumflex and right coronary arteries. Status post median sternotomy for CABG including LIMA to the LAD. ABDOMEN/PELVIS: Large mass centered in the caudate lobe adjacent to the IVC poorly depicted on today's CT examination but estimated to measure approximately 3.5 cm, diffusely  hypermetabolic (SUVmax = 23.5) . There is some adjacent lymphadenopathy immediately anterior to the inferior vena cava beneath the caudate lobe of the liver (potentially contiguous with this) measuring 19 mm in short axis (axial image 100 of series 4), also markedly hypermetabolic (SUVmax = 36.1). Incidental CT findings: Multiple tiny calcified gallstones lying dependently in the gallbladder. No findings to suggest an acute cholecystitis at this time. Subcentimeter high attenuation lesion in the posterior aspect of the lower pole the left kidney, not characterized on today's CT examination, but demonstrating no internal hypermetabolism, likely a proteinaceous/hemorrhagic cyst. Aortic atherosclerosis. Percutaneous gastrostomy tube with retention balloon in the stomach. 17 mm focus of apparent soft tissue attenuation in the left side of the urinary bladder (axial image 160 of series 4), suspicious for potential urothelial neoplasm (poorly evaluated on today's noncontrast CT examination). SKELETON: Multifocal hypermetabolism in soft tissues and visualized portions of axial and appendicular skeleton. Specific examples include a small hypermetabolic (SUVmax = 44.3) lesion in the inferior aspect of the sternum, large lytic lesion in the anterior aspect of the L1 vertebral body (SUVmax = 30.7) measuring 2.7 x 2.9 cm (axial image 108 of series 4), focus of hypermetabolism in the right iliacus muscle (adjacent to other areas of less well-defined hypermetabolism that are favored to be physiological, potentially due to strain) which demonstrates hypermetabolism (SUVmax = 5.7) , a lytic lesion involving the lateral aspect of the left ilium (axial image 132 of series 4) measuring 2.0 x 1.3 cm extending into the overlying gluteal musculature (SUVmax = 30.8) , as well as multiple other smaller lesions. Incidental CT findings: none IMPRESSION: 1. In addition to the primary thyroid lesion, there is widespread metastatic disease  throughout thoracic lymph nodes, lungs bilaterally, liver, abdominal lymph nodes, as well as muscles, soft tissues and bones, as detailed above. 2. Additional incidental findings, as above. Electronically Signed   By: Vinnie Langton M.D.   On: 05/02/2018 14:32   Dg Chest Port 1 View  Result Date: 05/23/2018 CLINICAL DATA:  Fever. EXAM: PORTABLE CHEST 1 VIEW COMPARISON:  PET-CT, 05/02/2018. Chest radiograph, 03/25/2018 and older exams. FINDINGS: Lung volumes are low. There are prominent bronchovascular markings. Small shaggy nodule in the left mid lung consistent with 1 of the nodules seen on the recent PET-CT.  There is no evidence of pneumonia. No pulmonary edema. No pleural effusion or pneumothorax. Stable changes from prior CABG surgery. No mediastinal or hilar masses. IMPRESSION: 1. No acute cardiopulmonary disease. No evidence of pneumonia or pulmonary edema. Electronically Signed   By: Lajean Manes M.D.   On: 05/06/2018 08:33   Ct Head Code Stroke Wo Contrast`  Result Date: 05/12/2018 CLINICAL DATA:  Code stroke. Altered mental status. Metastatic papillary thyroid cancer. EXAM: CT HEAD WITHOUT CONTRAST TECHNIQUE: Contiguous axial images were obtained from the base of the skull through the vertex without intravenous contrast. COMPARISON:  Brain MRI 09/28/2011 FINDINGS: Brain: An acute right frontoparietal intraparenchymal hemorrhage/hemorrhagic mass measures 5.2 x 6.1 x 4.9 cm (approximately 78 cc). There is extensive surrounding vasogenic edema which extends inferiorly into the white matter tracts near the posterior basal ganglia as well as into the temporal and occipital lobes. There is relatively diffuse right cerebral hemispheric sulcal effacement. There is partial effacement of the right lateral ventricle, and leftward midline shift measures 6 mm. There is a 1.5 cm hyperdense mass in the subcortical posterior left frontal lobe (series 2, image 23 and series 4, image 33) with mild surrounding  edema. No acute large territory cortically based infarct or extra-axial fluid collection is identified. Mild chronic small vessel ischemic disease is noted in the cerebral white matter. Vascular: Calcified atherosclerosis at the skull base. No hyperdense vessel. Skull: No fracture or suspicious osseous lesion. Sinuses/Orbits: Trace right mastoid effusion. Clear paranasal sinuses. Bilateral cataract extraction. Other: None. ASPECTS [facility] Stroke Program Early CT Score) Not scored due to hemorrhage. IMPRESSION: 6 cm right frontoparietal hemorrhage with extensive edema and 6 mm of leftward midline shift. This most likely represents a hemorrhagic metastasis from the patient's known thyroid cancer, with a 1.5 cm metastasis also noted in the left frontal lobe. Critical Value/emergent results were called by telephone at the time of interpretation on 05/13/2018 at 7:33 am to Dr. Pattricia Boss , who verbally acknowledged these results. Electronically Signed   By: Logan Bores M.D.   On: 05/01/2018 07:37   Impression: For hospice terminal care Active Problems:   Hx of CABG   COPD (chronic obstructive pulmonary disease) (HCC)   PAF (paroxysmal atrial fibrillation) (HCC)   Protein-calorie malnutrition, severe   Acute on chronic respiratory failure with hypoxia (HCC)   Pressure injury of skin   Papillary thyroid carcinoma ([place])   Hemorrhagic stroke ([place])     Plan: Comfort care      Tyneshia Stivers L   05/29/2018, 8:50 AM

## 2018-05-29 NOTE — Progress Notes (Signed)
Patient is resting in bed. Respirations are labored. Patient remains unresponsive. Patient was medicated with Prn medications and no change has been noted. Family is at bedside. Will continue to monitor and assess.

## 2018-05-29 NOTE — Progress Notes (Signed)
Patient to be transferred to Hospice today per chart. Spoke with nursing Lilia Pro) and shingles lesions are not dry/crusted. Hospice is aware. Advised to cover lesions for transport and place a regular surgical mask on patient for transport out of our facility. Lilia Pro verbalized understanding.

## 2018-05-29 DEATH — deceased

## 2018-06-09 ENCOUNTER — Ambulatory Visit (HOSPITAL_COMMUNITY): Payer: Medicare Other | Admitting: Hematology

## 2018-06-09 ENCOUNTER — Other Ambulatory Visit (HOSPITAL_COMMUNITY): Payer: Medicare Other

## 2018-11-30 IMAGING — CT CT ABDOMEN W/O CM
2 of 4 series · 15 of 46 positions shown, 17 images · non-contrast
Comparison: 02/22/2018 modified swallow

CLINICAL DATA: Dysphagia, aspiration risk, assess anatomy for
gastrostomy insertion

EXAM:
CT ABDOMEN WITHOUT CONTRAST
TECHNIQUE: Multidetector CT imaging of the abdomen was performed following the
standard protocol without IV contrast.

[Series 3: a/p w/o 5mm · axial · non-contrast · 0.93mm/px · z∈[+1104,+1350]mm · 12 of 55 slices shown, 14 images]
[im 3/55  soft-tissue]
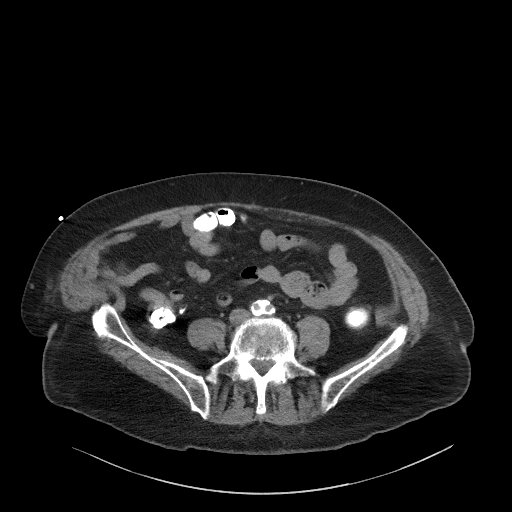
[im 3/55  bone]
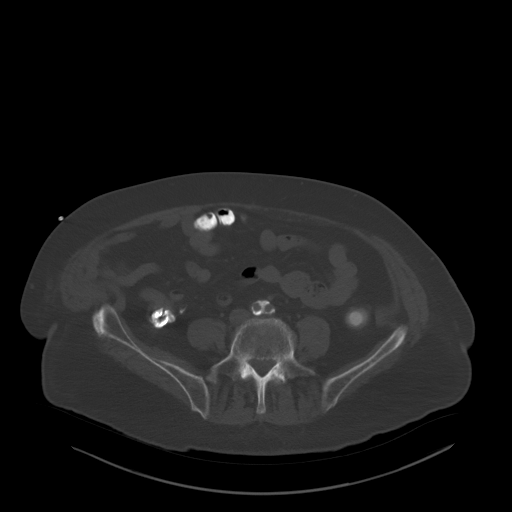
[im 9/55  soft-tissue]
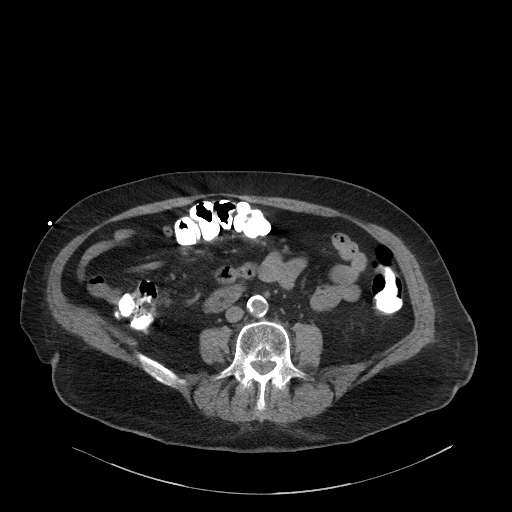
[im 11/55  soft-tissue]
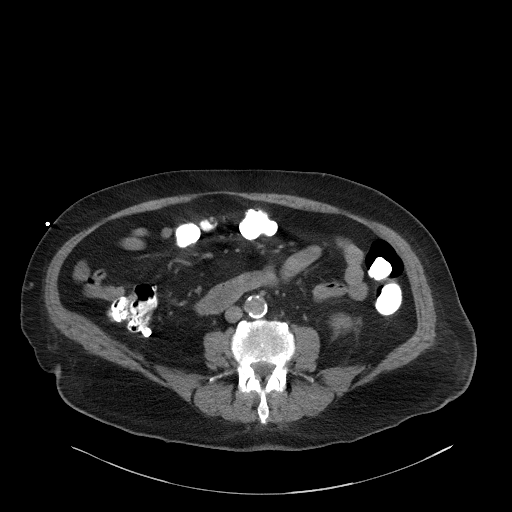
[im 17/55  soft-tissue]
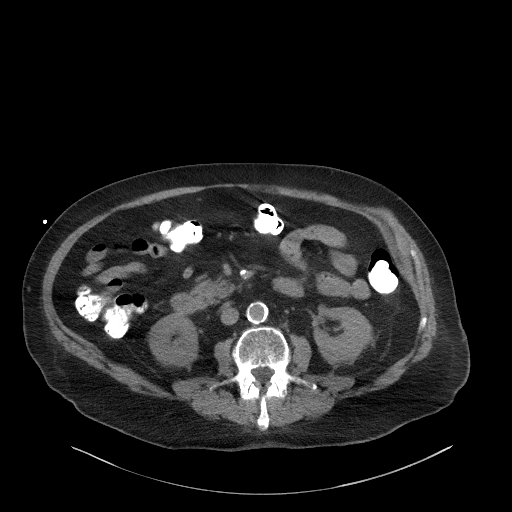
[im 22/55  soft-tissue]
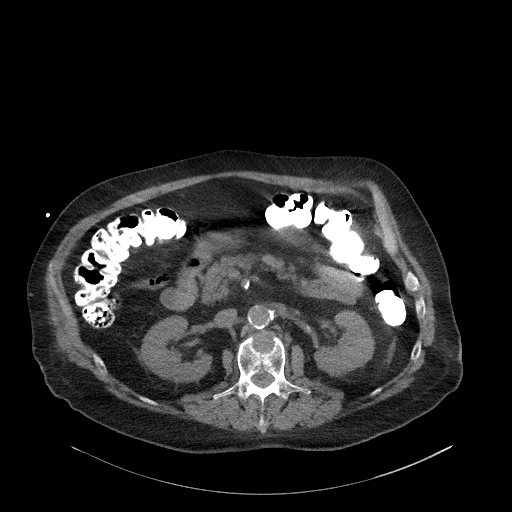
[im 25/55  soft-tissue]
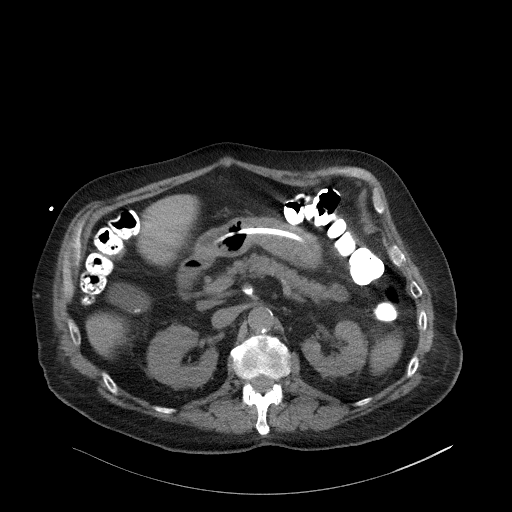
[im 30/55  soft-tissue]
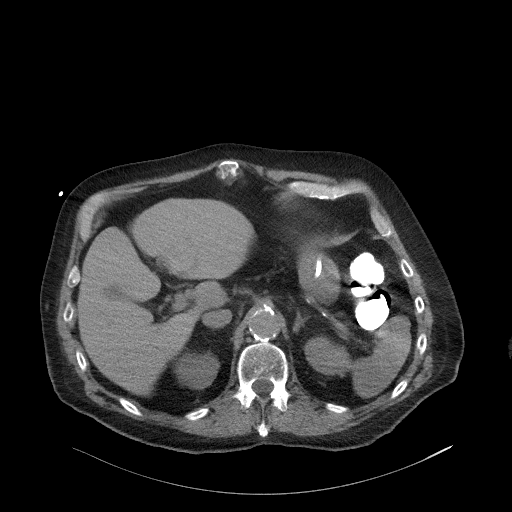
[im 33/55  soft-tissue]
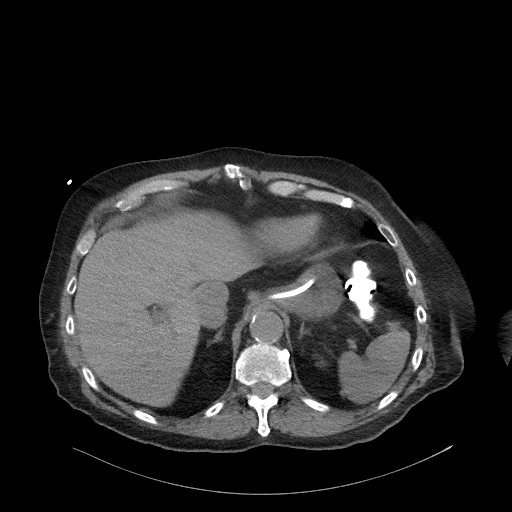
[im 38/55  soft-tissue]
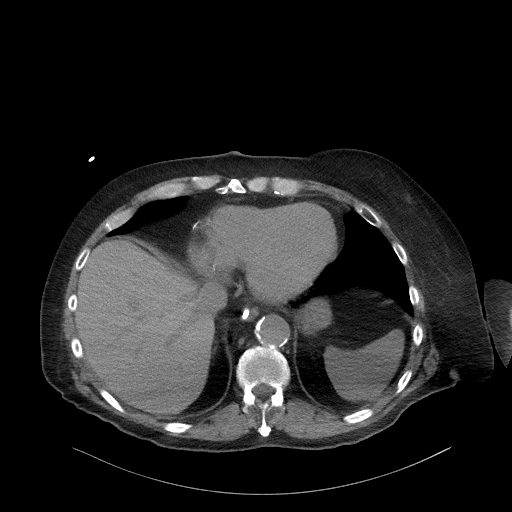
[im 38/55  bone]
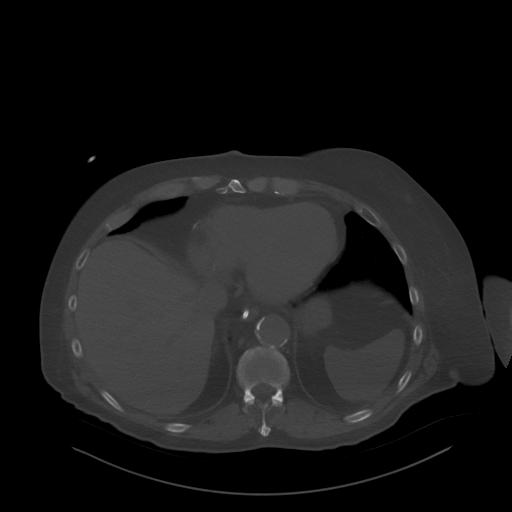
[im 44/55  soft-tissue]
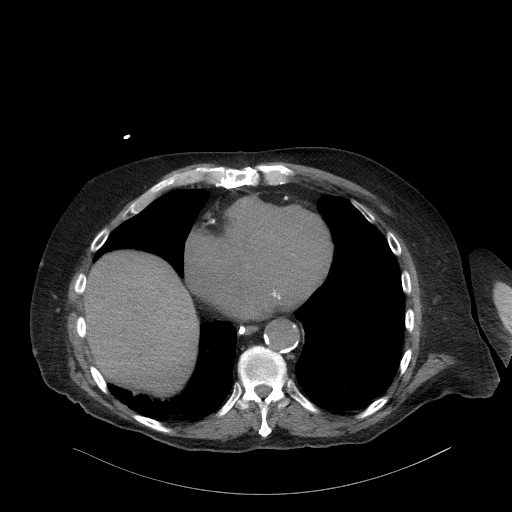
[im 46/55  soft-tissue]
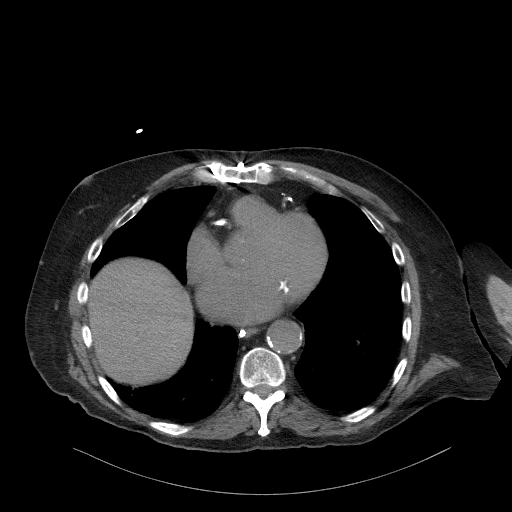
[im 52/55  soft-tissue]
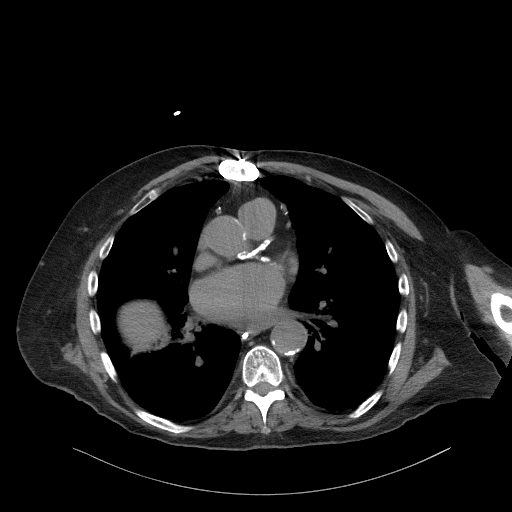

[Series 6: a/p w/o cor · coronal · non-contrast · 0.55mm/px · 3 of 136 slices shown]
[im 46/136  soft-tissue]
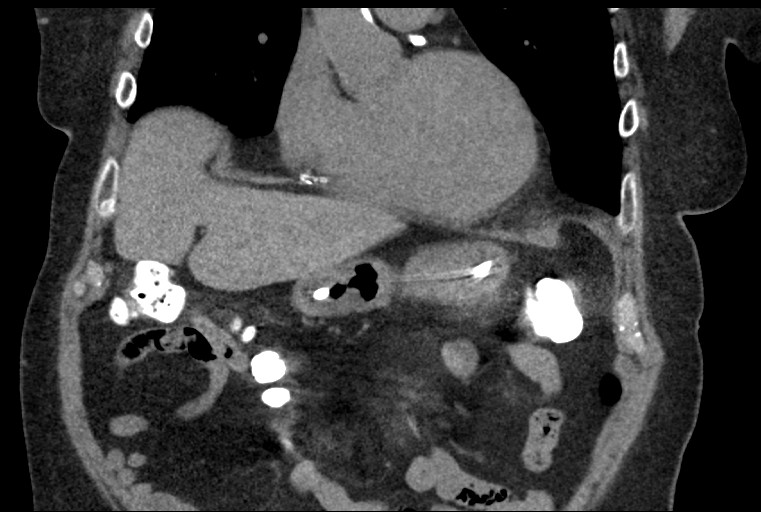
[im 61/136  soft-tissue]
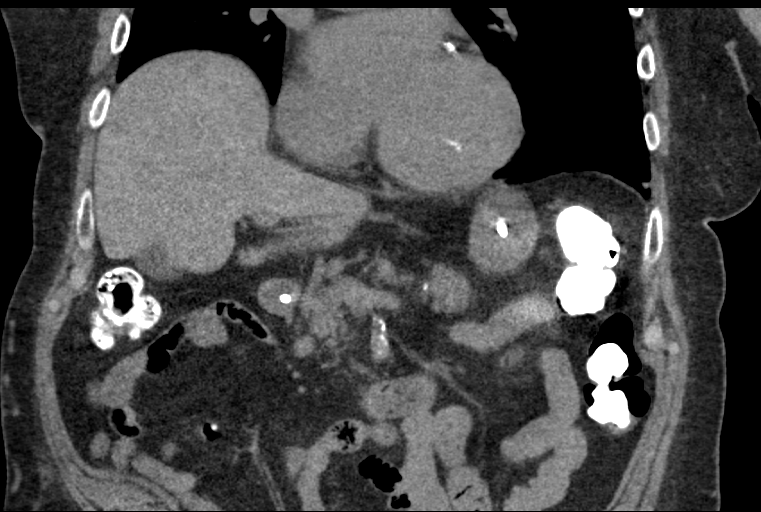
[im 76/136  soft-tissue]
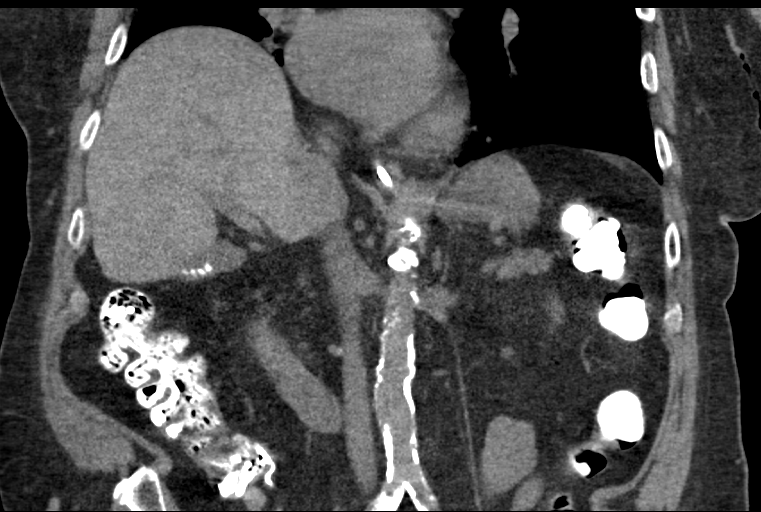

[15 of 46 positions shown; findings below may reference images not displayed]

FINDINGS: Lower chest: Several bilateral lower chest pulmonary nodules all
measuring 5 mm or less in size within the lower lobes, inferior
right middle lobe, and lingula

Right basilar atelectasis versus scarring also noted.

Normal heart size. Native coronary atherosclerosis. Previous median
sternotomy evident. Feeding tube extends into the stomach distally.

Hepatobiliary: Cholelithiasis evident. No focal hepatic abnormality.
No biliary dilatation or obstruction.

Pancreas: No focal abnormality or ductal dilatation. No surrounding
inflammatory process.

Spleen: Normal in size.

Adrenals/Urinary Tract: Normal adrenal glands. Kidneys demonstrate
no acute obstructive uropathy or hydronephrosis. Ureters are
symmetric and decompressed. Mild chronic perinephric strandy edema.
Indeterminate posterior left renal hyperdense cortical lesion
measures 10 mm this may represent a proteinaceous or hemorrhagic
cyst.

Stomach/Bowel: Feeding tube enters the stomach. No significant
hiatal hernia. Stomach anatomy appears favorable for fluoroscopic
gastrostomy technique. Stomach is inferior to the left hepatic lobe
and the midportion of the stomach is superior to the transverse
colon. Feeding tube extends into the duodenal bulb. Stomach is
currently decompressed.

Vascular/Lymphatic: Aortic atherosclerosis noted. Negative for
aneurysm. No retroperitoneal hemorrhage or hematoma. No abdominal
adenopathy.

Other: No abdominal wall hernia. No free fluid or ascites. Negative
for abscess.

Musculoskeletal: No acute osseous finding. Anterior L2 vertebral
body lytic lesion without compression pathologic fracture.
Appearance is suspicious for metastatic disease or myeloma.
IMPRESSION: Stomach anatomy appears favorable for attempt at fluoroscopic
gastrostomy insertion.

Numerous subcentimeter bilateral pulmonary nodules suspicious for
pulmonary metastases. Lytic L2 vertebral body suspicious for osseous
metastasis.

Atherosclerosis without aneurysm

Cholelithiasis
